# Patient Record
Sex: Male | Born: 1940
Health system: Southern US, Community
[De-identification: ages and names within clinical notes are randomized; demographics above are authoritative.]

## PROBLEM LIST (undated history)

## (undated) DIAGNOSIS — H409 Unspecified glaucoma: Secondary | ICD-10-CM

## (undated) DIAGNOSIS — K5792 Diverticulitis of intestine, part unspecified, without perforation or abscess without bleeding: Secondary | ICD-10-CM

## (undated) DIAGNOSIS — Z8669 Personal history of other diseases of the nervous system and sense organs: Secondary | ICD-10-CM

## (undated) DIAGNOSIS — I2510 Atherosclerotic heart disease of native coronary artery without angina pectoris: Secondary | ICD-10-CM

## (undated) DIAGNOSIS — G629 Polyneuropathy, unspecified: Secondary | ICD-10-CM

## (undated) DIAGNOSIS — I219 Acute myocardial infarction, unspecified: Secondary | ICD-10-CM

## (undated) DIAGNOSIS — D62 Acute posthemorrhagic anemia: Secondary | ICD-10-CM

## (undated) DIAGNOSIS — R0600 Dyspnea, unspecified: Secondary | ICD-10-CM

## (undated) DIAGNOSIS — N489 Disorder of penis, unspecified: Secondary | ICD-10-CM

## (undated) DIAGNOSIS — E119 Type 2 diabetes mellitus without complications: Secondary | ICD-10-CM

## (undated) DIAGNOSIS — G4730 Sleep apnea, unspecified: Secondary | ICD-10-CM

## (undated) DIAGNOSIS — Z0000 Encounter for general adult medical examination without abnormal findings: Secondary | ICD-10-CM

## (undated) DIAGNOSIS — Z923 Personal history of irradiation: Secondary | ICD-10-CM

## (undated) DIAGNOSIS — D649 Anemia, unspecified: Secondary | ICD-10-CM

## (undated) DIAGNOSIS — I739 Peripheral vascular disease, unspecified: Secondary | ICD-10-CM

## (undated) DIAGNOSIS — E039 Hypothyroidism, unspecified: Secondary | ICD-10-CM

## (undated) DIAGNOSIS — R351 Nocturia: Secondary | ICD-10-CM

## (undated) DIAGNOSIS — N529 Male erectile dysfunction, unspecified: Secondary | ICD-10-CM

## (undated) DIAGNOSIS — R3915 Urgency of urination: Secondary | ICD-10-CM

## (undated) DIAGNOSIS — I10 Essential (primary) hypertension: Secondary | ICD-10-CM

## (undated) DIAGNOSIS — L0390 Cellulitis, unspecified: Secondary | ICD-10-CM

## (undated) DIAGNOSIS — E785 Hyperlipidemia, unspecified: Secondary | ICD-10-CM

## (undated) HISTORY — DX: Atherosclerotic heart disease of native coronary artery without angina pectoris: I25.10

## (undated) HISTORY — DX: Acute posthemorrhagic anemia: D62

## (undated) HISTORY — DX: Disorder of penis, unspecified: N48.9

## (undated) HISTORY — DX: Polyneuropathy, unspecified: G62.9

## (undated) HISTORY — DX: Personal history of other diseases of the nervous system and sense organs: Z86.69

## (undated) HISTORY — DX: Hyperlipidemia, unspecified: E78.5

## (undated) HISTORY — DX: Sleep apnea, unspecified: G47.30

## (undated) HISTORY — DX: Male erectile dysfunction, unspecified: N52.9

## (undated) HISTORY — DX: Hypothyroidism, unspecified: E03.9

## (undated) HISTORY — DX: Nocturia: R35.1

## (undated) HISTORY — DX: Unspecified glaucoma: H40.9

## (undated) HISTORY — PX: EYE SURGERY: SHX253

## (undated) HISTORY — PX: CATARACT EXTRACTION: SUR2

## (undated) HISTORY — DX: Diverticulitis of intestine, part unspecified, without perforation or abscess without bleeding: K57.92

## (undated) HISTORY — PX: TONSILLECTOMY: SUR1361

## (undated) HISTORY — DX: Urgency of urination: R39.15

## (undated) HISTORY — DX: Encounter for general adult medical examination without abnormal findings: Z00.00

## (undated) HISTORY — PX: DENTAL SURGERY: SHX609

## (undated) HISTORY — PX: ANTERIOR CERVICAL DECOMP/DISCECTOMY FUSION: SHX1161

## (undated) HISTORY — DX: Anemia, unspecified: D64.9

---

## 1985-11-05 DIAGNOSIS — I219 Acute myocardial infarction, unspecified: Secondary | ICD-10-CM

## 1985-11-05 HISTORY — DX: Acute myocardial infarction, unspecified: I21.9

## 1989-02-05 HISTORY — PX: GUM SURGERY: SHX658

## 1999-06-08 HISTORY — PX: CORONARY ARTERY BYPASS GRAFT: SHX141

## 1999-12-17 ENCOUNTER — Encounter: Payer: Self-pay | Admitting: Cardiology

## 1999-12-17 ENCOUNTER — Ambulatory Visit (HOSPITAL_COMMUNITY): Admission: RE | Admit: 1999-12-17 | Discharge: 1999-12-18 | Payer: Self-pay | Admitting: Cardiology

## 1999-12-21 ENCOUNTER — Inpatient Hospital Stay (HOSPITAL_COMMUNITY): Admission: RE | Admit: 1999-12-21 | Discharge: 1999-12-26 | Payer: Self-pay | Admitting: Cardiothoracic Surgery

## 1999-12-21 ENCOUNTER — Encounter: Payer: Self-pay | Admitting: Cardiothoracic Surgery

## 1999-12-22 ENCOUNTER — Encounter: Payer: Self-pay | Admitting: Cardiothoracic Surgery

## 1999-12-23 ENCOUNTER — Encounter: Payer: Self-pay | Admitting: Cardiothoracic Surgery

## 1999-12-24 ENCOUNTER — Encounter: Payer: Self-pay | Admitting: Cardiothoracic Surgery

## 2000-01-19 ENCOUNTER — Encounter (HOSPITAL_COMMUNITY): Admission: RE | Admit: 2000-01-19 | Discharge: 2000-04-18 | Payer: Self-pay | Admitting: Cardiology

## 2001-06-07 HISTORY — PX: PALATE / UVULA BIOPSY / EXCISION: SUR128

## 2003-10-17 ENCOUNTER — Ambulatory Visit (HOSPITAL_COMMUNITY): Admission: RE | Admit: 2003-10-17 | Discharge: 2003-10-17 | Payer: Self-pay | Admitting: Internal Medicine

## 2011-12-16 ENCOUNTER — Ambulatory Visit (INDEPENDENT_AMBULATORY_CARE_PROVIDER_SITE_OTHER): Payer: Medicare Other | Admitting: Internal Medicine

## 2011-12-16 ENCOUNTER — Encounter: Payer: Self-pay | Admitting: Internal Medicine

## 2011-12-16 VITALS — BP 114/84 | HR 76 | Temp 98.4°F | Resp 16 | Ht 71.5 in | Wt 226.1 lb

## 2011-12-16 DIAGNOSIS — K5790 Diverticulosis of intestine, part unspecified, without perforation or abscess without bleeding: Secondary | ICD-10-CM

## 2011-12-16 DIAGNOSIS — N529 Male erectile dysfunction, unspecified: Secondary | ICD-10-CM

## 2011-12-16 DIAGNOSIS — N400 Enlarged prostate without lower urinary tract symptoms: Secondary | ICD-10-CM

## 2011-12-16 DIAGNOSIS — E119 Type 2 diabetes mellitus without complications: Secondary | ICD-10-CM

## 2011-12-16 DIAGNOSIS — I2510 Atherosclerotic heart disease of native coronary artery without angina pectoris: Secondary | ICD-10-CM

## 2011-12-16 DIAGNOSIS — E785 Hyperlipidemia, unspecified: Secondary | ICD-10-CM

## 2011-12-16 DIAGNOSIS — I700 Atherosclerosis of aorta: Secondary | ICD-10-CM | POA: Insufficient documentation

## 2011-12-16 DIAGNOSIS — K5730 Diverticulosis of large intestine without perforation or abscess without bleeding: Secondary | ICD-10-CM

## 2011-12-16 DIAGNOSIS — R21 Rash and other nonspecific skin eruption: Secondary | ICD-10-CM

## 2011-12-16 DIAGNOSIS — E1159 Type 2 diabetes mellitus with other circulatory complications: Secondary | ICD-10-CM | POA: Insufficient documentation

## 2011-12-16 DIAGNOSIS — E039 Hypothyroidism, unspecified: Secondary | ICD-10-CM

## 2011-12-16 DIAGNOSIS — E1151 Type 2 diabetes mellitus with diabetic peripheral angiopathy without gangrene: Secondary | ICD-10-CM | POA: Insufficient documentation

## 2011-12-16 LAB — CBC WITH DIFFERENTIAL/PLATELET
Basophils Absolute: 0 10*3/uL (ref 0.0–0.1)
Basophils Relative: 0 % (ref 0–1)
Eosinophils Absolute: 0.4 10*3/uL (ref 0.0–0.7)
Eosinophils Relative: 4 % (ref 0–5)
HCT: 39.7 % (ref 39.0–52.0)
Hemoglobin: 13.2 g/dL (ref 13.0–17.0)
Lymphocytes Relative: 30 % (ref 12–46)
Lymphs Abs: 3 10*3/uL (ref 0.7–4.0)
MCH: 29.7 pg (ref 26.0–34.0)
MCHC: 33.2 g/dL (ref 30.0–36.0)
MCV: 89.4 fL (ref 78.0–100.0)
Monocytes Absolute: 1 10*3/uL (ref 0.1–1.0)
Monocytes Relative: 10 % (ref 3–12)
Neutro Abs: 5.5 10*3/uL (ref 1.7–7.7)
Neutrophils Relative %: 56 % (ref 43–77)
Platelets: 257 10*3/uL (ref 150–400)
RBC: 4.44 MIL/uL (ref 4.22–5.81)
RDW: 13.4 % (ref 11.5–15.5)
WBC: 10 10*3/uL (ref 4.0–10.5)

## 2011-12-16 LAB — BASIC METABOLIC PANEL
BUN: 19 mg/dL (ref 6–23)
CO2: 27 mEq/L (ref 19–32)
Calcium: 9.1 mg/dL (ref 8.4–10.5)
Chloride: 104 mEq/L (ref 96–112)
Creat: 1.07 mg/dL (ref 0.50–1.35)
Glucose, Bld: 102 mg/dL — ABNORMAL HIGH (ref 70–99)
Potassium: 4.5 mEq/L (ref 3.5–5.3)
Sodium: 138 mEq/L (ref 135–145)

## 2011-12-16 LAB — HEPATIC FUNCTION PANEL
ALT: 22 U/L (ref 0–53)
AST: 24 U/L (ref 0–37)
Albumin: 4.4 g/dL (ref 3.5–5.2)
Alkaline Phosphatase: 51 U/L (ref 39–117)
Bilirubin, Direct: 0.1 mg/dL (ref 0.0–0.3)
Indirect Bilirubin: 0.2 mg/dL (ref 0.0–0.9)
Total Bilirubin: 0.3 mg/dL (ref 0.3–1.2)
Total Protein: 6.9 g/dL (ref 6.0–8.3)

## 2011-12-16 LAB — HEMOGLOBIN A1C
Hgb A1c MFr Bld: 6.3 % — ABNORMAL HIGH (ref <5.7)
Mean Plasma Glucose: 134 mg/dL — ABNORMAL HIGH (ref <117)

## 2011-12-16 MED ORDER — DUTASTERIDE 0.5 MG PO CAPS: 0.5000 mg | ORAL_CAPSULE | Freq: Every day | ORAL | Status: DC

## 2011-12-16 MED ORDER — VARDENAFIL HCL 20 MG PO TABS: 20.0000 mg | ORAL_TABLET | Freq: Every day | ORAL | Status: DC | PRN

## 2011-12-16 MED ORDER — METFORMIN HCL ER 500 MG PO TB24: 1000.0000 mg | ORAL_TABLET | Freq: Every day | ORAL | Status: DC

## 2011-12-16 NOTE — Patient Instructions (Signed)
Please schedule fasting labs prior to next visit Chem7, a1c-250.00 and lipid-272.4 

## 2011-12-16 NOTE — Assessment & Plan Note (Signed)
Good control based on fsbs. Obtain cbc, chem7, a1c and urine microalbumin

## 2011-12-16 NOTE — Assessment & Plan Note (Signed)
Dermatology consult

## 2011-12-16 NOTE — Assessment & Plan Note (Signed)
Asx. Schedule follow with cardiology for surveillance.

## 2011-12-16 NOTE — Progress Notes (Signed)
  Subjective:    Patient ID: Harry Brown, male    DOB: 04-23-1941, 71 y.o.   MRN: [id number]  HPI Pt presents to clinic to establish care and for follow up of multiple medical problems. Notes 2year h/o right dorsal hand rash that appears thickened/hypertrophied. S/p lidex with emolient with dose changes without improvement. Also notes 2+month h/o right thoracic back rash that itches. Lidex helps this area some. No associated pain. H/o CAD s/p CABG without recent cp or dyspnea. Last diabetic f/u several months ago. FSBS typically low 100's without hypoglycemia. UTD with diabetic eye exam. Suffers from ED and requests medication. Has attempted viagra in the past without adverse effect. Does not take NTG.   Past Medical History  Diagnosis Date  . Diabetes mellitus   . Diverticulitis   . History of sleep apnea    Past Surgical History  Procedure Date  . Coronary artery bypass graft 1998  . Tonsillectomy 2003  . Palate / uvula biopsy / excision 2003    Removed due to sleep apnea    reports that he has quit smoking. He has never used smokeless tobacco. He reports that he does not drink alcohol. His drug history not on file. family history includes Diabetes in his mother; Heart disease in his father and mother; Hyperlipidemia in his mother; and Hypertension in his mother.  There is no history of Cancer. Allergies not on file   Review of Systems  Respiratory: Negative for shortness of breath.   Cardiovascular: Negative for chest pain.  Skin: Positive for rash.  All other systems reviewed and are negative.       Objective:   Physical Exam  Physical Exam  Nursing note and vitals reviewed. Constitutional: Appears well-developed and well-nourished. No distress.  HENT:  Head: Normocephalic and atraumatic.  Right Ear: External ear normal.  Left Ear: External ear normal.  Eyes: Conjunctivae are normal. No scleral icterus.  Neck: Neck supple. Carotid bruit is not present.    Cardiovascular: Normal rate, regular rhythm and normal heart sounds.  Exam reveals no gallop and no friction rub.   No murmur heard. Pulmonary/Chest: Effort normal and breath sounds normal. No respiratory distress. He has no wheezes. no rales.  Lymphadenopathy:    He has no cervical adenopathy.  Neurological:Alert.  Skin: Skin is warm and dry. Not diaphoretic.  Psychiatric: Has a normal mood and affect.        Assessment & Plan:

## 2011-12-16 NOTE — Assessment & Plan Note (Signed)
Attempt levitra prn. Dosing instructions provided.

## 2011-12-16 NOTE — Assessment & Plan Note (Signed)
Obtain tsh/free t4 

## 2011-12-17 LAB — T4, FREE: Free T4: 1.08 ng/dL (ref 0.80–1.80)

## 2011-12-17 LAB — MICROALBUMIN / CREATININE URINE RATIO
Creatinine, Urine: 89.9 mg/dL
Microalb Creat Ratio: 8.7 mg/g (ref 0.0–30.0)
Microalb, Ur: 0.78 mg/dL (ref 0.00–1.89)

## 2011-12-17 LAB — TSH: TSH: 3.724 u[IU]/mL (ref 0.350–4.500)

## 2011-12-27 ENCOUNTER — Encounter: Payer: Self-pay | Admitting: *Deleted

## 2011-12-29 ENCOUNTER — Ambulatory Visit (INDEPENDENT_AMBULATORY_CARE_PROVIDER_SITE_OTHER): Payer: Medicare Other | Admitting: Cardiology

## 2011-12-29 ENCOUNTER — Encounter: Payer: Self-pay | Admitting: Cardiology

## 2011-12-29 VITALS — BP 124/82 | HR 66 | Ht 72.0 in | Wt 226.0 lb

## 2011-12-29 DIAGNOSIS — I2510 Atherosclerotic heart disease of native coronary artery without angina pectoris: Secondary | ICD-10-CM

## 2011-12-29 DIAGNOSIS — E785 Hyperlipidemia, unspecified: Secondary | ICD-10-CM

## 2011-12-29 DIAGNOSIS — R0989 Other specified symptoms and signs involving the circulatory and respiratory systems: Secondary | ICD-10-CM

## 2011-12-29 NOTE — Progress Notes (Signed)
`````````````````````````````````````````````````````````````````````````````````````````````````````  HPI: 71 year old male for evaluation of coronary artery disease. The patient is status post cardiac catheterization in July of 2001. His ejection fraction was 53%. The patient has three-vessel coronary disease and underwent coronary artery bypassing graft in July of 2001 with a LIMA to the LAD, RIMA to the circumflex and saphenous vein graft to the PDA. Patient has not had routine cardiology followup. Patient denies dyspnea on exertion, orthopnea, PND, pedal edema, claudication, syncope or chest pain.  Current Outpatient Prescriptions  Medication Sig Dispense Refill  . atenolol (TENORMIN) 25 MG tablet Take 1 tablet by mouth daily.      Marland Kitchen dutasteride (AVODART) 0.5 MG capsule Take 1 capsule (0.5 mg total) by mouth daily.  30 capsule  6  . FREESTYLE LITE test strip Use to test blood sugar 1 to 2 times daily      . Lancets (FREESTYLE) lancets Use to check blood sugar 1-2 times daily      . levothyroxine (SYNTHROID, LEVOTHROID) 50 MCG tablet Take 1 tablet by mouth daily.      . metFORMIN (GLUCOPHAGE-XR) 500 MG 24 hr tablet Take 2 tablets (1,000 mg total) by mouth daily with breakfast.  60 tablet  6  . nicotine polacrilex (COMMIT) 2 MG lozenge Place 2 mg inside cheek as needed.      . simvastatin (ZOCOR) 40 MG tablet Take 1 tablet by mouth At bedtime.      . triamcinolone cream (KENALOG) 0.1 % Apply 1 application topically 2 (two) times daily.      . vardenafil (LEVITRA) 20 MG tablet Take 1 tablet (20 mg total) by mouth daily as needed for erectile dysfunction.  6 tablet  3     Past Medical History  Diagnosis Date  . Diabetes mellitus   . Diverticulitis   . History of sleep apnea   . CAD (coronary artery disease)   . Hypothyroidism   . ED (erectile dysfunction)   . Other and unspecified hyperlipidemia     Past Surgical History  Procedure Date  . Coronary artery bypass graft 2001  .  Tonsillectomy 2003  . Palate / uvula biopsy / excision 2003    Removed due to sleep apnea  . Cervical disc surgery     History   Social History  . Marital Status: Married    Spouse Name: N/A    Number of Children: 8   . Years of Education: N/A   Occupational History  .     Social History Main Topics  . Smoking status: Former Games developer  . Smokeless tobacco: Never Used   Comment: Uses Comit lozenges  . Alcohol Use: No  . Drug Use: Not on file  . Sexually Active: Not on file   Other Topics Concern  . Not on file   Social History Narrative  . No narrative on file    ROS: some knee arthralgias but no fevers or chills, productive cough, hemoptysis, dysphasia, odynophagia, melena, hematochezia, dysuria, hematuria, rash, seizure activity, orthopnea, PND, pedal edema, claudication. Remaining systems are negative.  Physical Exam: Well-developed well-nourished in no acute distress.  Skin is warm and dry.  Patient does not appear to be depressed. No peripheral clubbing. Back is normal. HEENT is normal. Normal eyelids Neck is supple. Normal carotid upstroke with no bruits. No thyromegaly. Chest is clear to auscultation with normal expansion.  Cardiovascular exam is regular rate and rhythm. No murmurs rubs or gallops. Abdominal exam nontender or distended. No masses palpated. Positive bruit. No hepatosplenomegaly.  2+ femoral pulses with no bruits. Extremities show no edema. 1+ dorsalis pedis pulses bilaterally. neuro grossly intact  ECG sinus rhythm at a rate of 66. Prior inferior infarct.

## 2011-12-29 NOTE — Assessment & Plan Note (Signed)
Schedule abdominal ultrasound to exclude aneurysm. 

## 2011-12-29 NOTE — Patient Instructions (Addendum)
Your physician wants you to follow-up in: ONE YEAR WITH DR Jens Som IN [place] You will receive a reminder letter in the mail two months in advance. If you don't receive a letter, please call our office to schedule the follow-up appointment.   Your physician has requested that you have en exercise stress myoview. For further information please visit [email or web address] Please follow instruction sheet, as given.   Your physician has requested that you have an abdominal aorta duplex. During this test, an ultrasound is used to evaluate the aorta. Allow 30 minutes for this exam. Do not eat after midnight the day before and avoid carbonated beverages

## 2011-12-29 NOTE — Assessment & Plan Note (Signed)
Continue aspirin and statin. Schedule Myoview for risk stratification. 

## 2011-12-29 NOTE — Assessment & Plan Note (Signed)
Continue statin. Lipids and liver monitored by primary care. 

## 2012-01-10 ENCOUNTER — Telehealth: Payer: Self-pay | Admitting: Internal Medicine

## 2012-01-10 NOTE — Telephone Encounter (Signed)
Received medical records from [facility]. Santina Evans  P: [phone] F: [phone]

## 2012-01-11 ENCOUNTER — Ambulatory Visit (HOSPITAL_COMMUNITY): Payer: Medicare Other | Attending: Cardiology | Admitting: Radiology

## 2012-01-11 VITALS — BP 137/84 | Ht 71.5 in | Wt 224.0 lb

## 2012-01-11 DIAGNOSIS — E119 Type 2 diabetes mellitus without complications: Secondary | ICD-10-CM | POA: Insufficient documentation

## 2012-01-11 DIAGNOSIS — R002 Palpitations: Secondary | ICD-10-CM | POA: Insufficient documentation

## 2012-01-11 DIAGNOSIS — Z951 Presence of aortocoronary bypass graft: Secondary | ICD-10-CM | POA: Insufficient documentation

## 2012-01-11 DIAGNOSIS — I252 Old myocardial infarction: Secondary | ICD-10-CM | POA: Insufficient documentation

## 2012-01-11 DIAGNOSIS — Z87891 Personal history of nicotine dependence: Secondary | ICD-10-CM | POA: Insufficient documentation

## 2012-01-11 DIAGNOSIS — Z8249 Family history of ischemic heart disease and other diseases of the circulatory system: Secondary | ICD-10-CM | POA: Insufficient documentation

## 2012-01-11 DIAGNOSIS — E785 Hyperlipidemia, unspecified: Secondary | ICD-10-CM | POA: Insufficient documentation

## 2012-01-11 DIAGNOSIS — I2510 Atherosclerotic heart disease of native coronary artery without angina pectoris: Secondary | ICD-10-CM

## 2012-01-11 DIAGNOSIS — I4949 Other premature depolarization: Secondary | ICD-10-CM

## 2012-01-11 MED ORDER — REGADENOSON 0.4 MG/5ML IV SOLN
0.4000 mg | Freq: Once | INTRAVENOUS | Status: AC
  Administered 2012-01-11: 0.4 mg via INTRAVENOUS

## 2012-01-11 MED ORDER — TECHNETIUM TC 99M TETROFOSMIN IV KIT
10.0000 | PACK | Freq: Once | INTRAVENOUS | Status: AC | PRN
  Administered 2012-01-11: 10 via INTRAVENOUS

## 2012-01-11 MED ORDER — TECHNETIUM TC 99M TETROFOSMIN IV KIT
30.0000 | PACK | Freq: Once | INTRAVENOUS | Status: AC | PRN
  Administered 2012-01-11: 30 via INTRAVENOUS

## 2012-01-11 NOTE — Progress Notes (Signed)
[facility] SITE 3 NUCLEAR MED [address] [place] [address] [phone]  Cardiology Nuclear Med Study  Harry Brown is a 71 y.o. male     MRN : [id number]     DOB: 06/07/1941  Procedure Date: 01/11/2012  Nuclear Med Background Indication for Stress Test:  Evaluation for Ischemia and Graft Patency History:  1987: IWMI, 2001:Heart Cath: 3V Dz EF: 53% -Sent for CABG x3 *No Follow Up Post Bypass* Cardiac Risk Factors: Family History - CAD, History of Smoking, Lipids and NIDDM  Symptoms:  Palpitations   Nuclear Pre-Procedure Caffeine/Decaff Intake:  None > 12 hrs NPO After: 10:00pm   Lungs:  clear O2 Sat: 98% on room air. IV 0.9% NS with Angio Cath:  22g  IV Site: L Hand, tolerated well IV Started by:  Irean Hong, RN  Chest Size (in):  44 Cup Size: n/a  Height: 5' 11.5" (1.816 m)  Weight:  224 lb (101.606 kg)  BMI:  Body mass index is 30.81 kg/(m^2). Tech Comments:  Took atenolol this am per MD, Held metformin this am This patient stated on the treadmill and walked well. When she was injected part of the IV came came apart at the hub and the patient only received 1/2 the Myoview dose he was supposed to get. He was then switched to a sitting Lexiscan.    Nuclear Med Study 1 or 2 day study: 1 day  Stress Test Type:  Eugenie Birks  Reading MD: Cassell Clement, MD  Order Authorizing Provider:  Olga Millers, MD  Resting Radionuclide: Technetium 80m Tetrofosmin  Resting Radionuclide Dose: 11.0 mCi   Stress Radionuclide:  Technetium 64m Tetrofosmin  Stress Radionuclide Dose: 32.8 mCi           Stress Protocol Rest HR: 85 Stress HR: 100  Rest BP: 140/80 Stress BP: 128/75  Exercise Time (min): n/a METS: n/a   Predicted Max HR: 152 bpm % Max HR: 69.74 bpm Rate Pressure Product: 21308   Dose of Adenosine (mg):  n/a Dose of Lexiscan: 0.4 mg  Dose of Atropine (mg): n/a Dose of Dobutamine: n/a mcg/kg/min (at max HR)  Stress Test Technologist: Milana Na, EMT-P  Nuclear Technologist:  Doyne Keel, CNMT     Rest Procedure:  Myocardial perfusion imaging was performed at rest 45 minutes following the intravenous administration of Technetium 29m Tetrofosmin. Rest ECG: NSR - Normal EKG  Stress Procedure:  The patient received IV Lexiscan 0.4 mg over 15-seconds.  Technetium 85m Tetrofosmin injected at 30-seconds.  There were no significant changes and sob with Lexiscan.  Quantitative spect images were obtained after a 45 minute delay. Stress ECG: No significant change from baseline ECG  QPS Raw Data Images:  Normal; no motion artifact; normal heart/lung ratio. Stress Images:  Normal homogeneous uptake in all areas of the myocardium. Rest Images:  Normal homogeneous uptake in all areas of the myocardium. Subtraction (SDS):  No evidence of ischemia. Transient Ischemic Dilatation (Normal <1.22):  0.91 Lung/Heart Ratio (Normal <0.45):  0.35  Quantitative Gated Spect Images QGS EDV:  88 ml QGS ESV:  38 ml  Impression Exercise Capacity:  Lexiscan with no exercise. BP Response:  Normal blood pressure response. Clinical Symptoms:  No significant symptoms noted. ECG Impression:  No significant ST segment change suggestive of ischemia. Comparison with Prior Nuclear Study: No images to compare  Overall Impression:  Normal stress nuclear study.  LV Ejection Fraction: 57%.  LV Wall Motion:  NL LV Function; NL Wall  Motion  [employer]     0

## 2012-01-12 ENCOUNTER — Encounter (INDEPENDENT_AMBULATORY_CARE_PROVIDER_SITE_OTHER): Payer: Medicare Other

## 2012-01-12 DIAGNOSIS — R0989 Other specified symptoms and signs involving the circulatory and respiratory systems: Secondary | ICD-10-CM

## 2012-01-12 DIAGNOSIS — I714 Abdominal aortic aneurysm, without rupture: Secondary | ICD-10-CM

## 2012-03-21 ENCOUNTER — Ambulatory Visit: Payer: Medicare Other | Admitting: Internal Medicine

## 2012-03-24 ENCOUNTER — Telehealth: Payer: Self-pay | Admitting: *Deleted

## 2012-03-24 DIAGNOSIS — E785 Hyperlipidemia, unspecified: Secondary | ICD-10-CM

## 2012-03-24 DIAGNOSIS — E119 Type 2 diabetes mellitus without complications: Secondary | ICD-10-CM

## 2012-03-24 LAB — BASIC METABOLIC PANEL
BUN: 20 mg/dL (ref 6–23)
CO2: 28 mEq/L (ref 19–32)
Calcium: 9.2 mg/dL (ref 8.4–10.5)
Chloride: 105 mEq/L (ref 96–112)
Creat: 1 mg/dL (ref 0.50–1.35)
Glucose, Bld: 117 mg/dL — ABNORMAL HIGH (ref 70–99)
Potassium: 5 mEq/L (ref 3.5–5.3)
Sodium: 141 mEq/L (ref 135–145)

## 2012-03-24 LAB — LIPID PANEL
Cholesterol: 170 mg/dL (ref 0–200)
HDL: 44 mg/dL (ref >39)
LDL Cholesterol: 110 mg/dL — ABNORMAL HIGH (ref 0–99)
Total CHOL/HDL Ratio: 3.9 Ratio
Triglycerides: 81 mg/dL (ref <150)
VLDL: 16 mg/dL (ref 0–40)

## 2012-03-24 NOTE — Telephone Encounter (Signed)
Pt presented to the lab. Orders enter per 12/2011 office note.   Please schedule fasting labs prior to next visit  Chem7, a1c-250.00 and lipid-272.4

## 2012-03-25 LAB — HEMOGLOBIN A1C
Hgb A1c MFr Bld: 6.4 % — ABNORMAL HIGH (ref <5.7)
Mean Plasma Glucose: 137 mg/dL — ABNORMAL HIGH (ref <117)

## 2012-03-31 ENCOUNTER — Ambulatory Visit (INDEPENDENT_AMBULATORY_CARE_PROVIDER_SITE_OTHER): Payer: Medicare Other | Admitting: Internal Medicine

## 2012-03-31 ENCOUNTER — Encounter: Payer: Self-pay | Admitting: Internal Medicine

## 2012-03-31 VITALS — BP 122/78 | HR 68 | Temp 98.0°F | Resp 12 | Ht 71.5 in | Wt 230.0 lb

## 2012-03-31 DIAGNOSIS — E119 Type 2 diabetes mellitus without complications: Secondary | ICD-10-CM

## 2012-03-31 DIAGNOSIS — R209 Unspecified disturbances of skin sensation: Secondary | ICD-10-CM

## 2012-03-31 DIAGNOSIS — G629 Polyneuropathy, unspecified: Secondary | ICD-10-CM | POA: Insufficient documentation

## 2012-03-31 DIAGNOSIS — R202 Paresthesia of skin: Secondary | ICD-10-CM

## 2012-03-31 DIAGNOSIS — Z23 Encounter for immunization: Secondary | ICD-10-CM

## 2012-03-31 DIAGNOSIS — E785 Hyperlipidemia, unspecified: Secondary | ICD-10-CM

## 2012-03-31 HISTORY — DX: Polyneuropathy, unspecified: G62.9

## 2012-03-31 MED ORDER — ATORVASTATIN CALCIUM 40 MG PO TABS: 40.0000 mg | ORAL_TABLET | Freq: Every day | ORAL | Status: DC

## 2012-03-31 NOTE — Patient Instructions (Signed)
Please schedule fasting labs prior to next visit Cbc, chem7, a1c-250.00, lipid/lft-272.4, b12-paresthesia and tsh/free t4-hypothyroidism

## 2012-03-31 NOTE — Assessment & Plan Note (Signed)
LDL suboptimal. Change to Zocor to Lipitor 40 mg daily. Repeat fasting lipid profile and liver function test prior to next visit

## 2012-03-31 NOTE — Progress Notes (Signed)
  Subjective:    Patient ID: Harry Brown, male    DOB: 1940-09-04, 71 y.o.   MRN: [id number]  HPI Pt presents to clinic for followup of multiple medical problems. Notes right toes plantar aspect with odd feeling? Paresthesia. Denies back pain or radicular leg pain. Blood pressure reviewed as normotensive. Glycemic control remains good. LDL above goal taking Zocor 40 mg daily without adverse effect. Does have known history of CAD followed by cardiology. Has received influenza vaccine for the season already.  Past Medical History  Diagnosis Date  . Diabetes mellitus   . Diverticulitis   . History of sleep apnea   . CAD (coronary artery disease)   . Hypothyroidism   . ED (erectile dysfunction)   . Other and unspecified hyperlipidemia    Past Surgical History  Procedure Date  . Coronary artery bypass graft 2001  . Tonsillectomy 2003  . Palate / uvula biopsy / excision 2003    Removed due to sleep apnea  . Cervical disc surgery     reports that he has quit smoking. He has never used smokeless tobacco. He reports that he does not drink alcohol. His drug history not on file. family history includes Diabetes in his mother; Heart disease in his father and mother; Hyperlipidemia in his mother; and Hypertension in his mother.  There is no history of Cancer. No Known Allergies    Review of Systems see hpi     Objective:   Physical Exam  Physical Exam  Nursing note and vitals reviewed. Constitutional: Appears well-developed and well-nourished. No distress.  HENT:  Head: Normocephalic and atraumatic.  Right Ear: External ear normal.  Left Ear: External ear normal.  Eyes: Conjunctivae are normal. No scleral icterus.  Neck: Neck supple. Carotid bruit is not present.  Cardiovascular: Normal rate, regular rhythm and normal heart sounds.  Exam reveals no gallop and no friction rub.   No murmur heard. Pulmonary/Chest: Effort normal and breath sounds normal. No respiratory distress. He  has no wheezes. no rales.  Lymphadenopathy:    He has no cervical adenopathy.  Neurological:Alert.  Skin: Skin is warm and dry. Not diaphoretic.  Psychiatric: Has a normal mood and affect.  Diabetic foot exam-monofilament exam normal. No diabetic wounds or ulcerations noted.      Assessment & Plan:

## 2012-03-31 NOTE — Assessment & Plan Note (Signed)
Monofilament exam normal. Monitor for signs and symptoms of nerve impingement. Obtain B12 level prior to next visit

## 2012-03-31 NOTE — Assessment & Plan Note (Signed)
Good control. Continue current regimen. 

## 2012-04-05 DIAGNOSIS — Z23 Encounter for immunization: Secondary | ICD-10-CM

## 2012-06-27 ENCOUNTER — Ambulatory Visit (INDEPENDENT_AMBULATORY_CARE_PROVIDER_SITE_OTHER): Payer: Medicare Other | Admitting: Family Medicine

## 2012-06-27 ENCOUNTER — Encounter: Payer: Self-pay | Admitting: Family Medicine

## 2012-06-27 VITALS — BP 112/78 | HR 75 | Temp 98.1°F | Ht 71.5 in | Wt 230.0 lb

## 2012-06-27 DIAGNOSIS — E119 Type 2 diabetes mellitus without complications: Secondary | ICD-10-CM

## 2012-06-27 DIAGNOSIS — E785 Hyperlipidemia, unspecified: Secondary | ICD-10-CM

## 2012-06-27 DIAGNOSIS — E039 Hypothyroidism, unspecified: Secondary | ICD-10-CM

## 2012-06-27 DIAGNOSIS — I2510 Atherosclerotic heart disease of native coronary artery without angina pectoris: Secondary | ICD-10-CM

## 2012-06-27 NOTE — Patient Instructions (Addendum)
Diabetes and Exercise  Regular exercise is important and can help:   · Control blood glucose (sugar).  · Decrease blood pressure.  ·   · Control blood lipids (cholesterol, triglycerides).  · Improve overall health.  BENEFITS FROM EXERCISE  · Improved fitness.  · Improved flexibility.  · Improved endurance.  · Increased bone density.  · Weight control.  · Increased muscle strength.  · Decreased body fat.  · Improvement of the body's use of insulin, a hormone.  · Increased insulin sensitivity.  · Reduction of insulin needs.  · Reduced stress and tension.  · Helps you feel better.  People with diabetes who add exercise to their lifestyle gain additional benefits, including:  · Weight loss.  · Reduced appetite.  · Improvement of the body's use of blood glucose.  · Decreased risk factors for heart disease:  · Lowering of cholesterol and triglycerides.  · Raising the level of good cholesterol (high-density lipoproteins, HDL).  · Lowering blood sugar.  · Decreased blood pressure.  TYPE 1 DIABETES AND EXERCISE  · Exercise will usually lower your blood glucose.  · If blood glucose is greater than 240 mg/dl, check urine ketones. If ketones are present, do not exercise.  · Location of the insulin injection sites may need to be adjusted with exercise. Avoid injecting insulin into areas of the body that will be exercised. For example, avoid injecting insulin into:  · The arms when playing tennis.  · The legs when jogging. For more information, discuss this with your caregiver.  · Keep a record of:  · Food intake.  · Type and amount of exercise.  · Expected peak times of insulin action.  · Blood glucose levels.  Do this before, during, and after exercise. Review your records with your caregiver. This will help you to develop guidelines for adjusting food intake and insulin amounts.   TYPE 2 DIABETES AND EXERCISE  · Regular physical activity can help control blood glucose.  · Exercise is important because it may:  · Increase the  body's sensitivity to insulin.  · Improve blood glucose control.  · Exercise reduces the risk of heart disease. It decreases serum cholesterol and triglycerides. It also lowers blood pressure.  · Those who take insulin or oral hypoglycemic agents should watch for signs of hypoglycemia. These signs include dizziness, shaking, sweating, chills, and confusion.  · Body water is lost during exercise. It must be replaced. This will help to avoid loss of body fluids (dehydration) or heat stroke.  Be sure to talk to your caregiver before starting an exercise program to make sure it is safe for you. Remember, any activity is better than none.   Document Released: 08/14/2003 Document Revised: 08/16/2011 Document Reviewed: 11/28/2008  Exit[employer]® Patient Information ©2013 [employer].

## 2012-06-28 ENCOUNTER — Encounter: Payer: Self-pay | Admitting: Family Medicine

## 2012-07-02 NOTE — Progress Notes (Signed)
Patient ID: Harry Brown, male   DOB: June 24, 1940, 72 y.o.   MRN: [id number] Harry Brown [id number] 1940/08/14 07/02/2012      Progress Note-Follow Up  Subjective  Chief Complaint  Chief Complaint  Patient presents with  . Follow-up    3 month    HPI  Patient is a 72 year old Caucasian male who is in today for routine followup. He reports generally being in good health although he did have a headache last night which is unusual for him. He says he woke up with a headache over his for it he took some Advil it has resolved and he feels well today. No neurologic complaints are noted. He notes his blood sugars are well controlled. He reports his blood sugars morning was 119. Anion numbers range between 80 and 120. He notes yesterday he had a long drive to [place] to help his wife do some shopping and things that may have contributed to his headache. No illness, congestion, fevers, chest pain, palpitations, shortness of breath, GI or GU complaints noted.  Past Medical History  Diagnosis Date  . Diabetes mellitus   . Diverticulitis   . History of sleep apnea   . CAD (coronary artery disease)   . Hypothyroidism   . ED (erectile dysfunction)   . Other and unspecified hyperlipidemia     Past Surgical History  Procedure Date  . Coronary artery bypass graft 2001  . Tonsillectomy 2003  . Palate / uvula biopsy / excision 2003    Removed due to sleep apnea  . Cervical disc surgery     Family History  Problem Relation Age of Onset  . Hyperlipidemia Mother   . Heart disease Mother   . Diabetes Mother   . Hypertension Mother   . Heart disease Father   . Cancer Neg Hx     History   Social History  . Marital Status: Married    Spouse Name: N/A    Number of Children: 8   . Years of Education: N/A   Occupational History  .     Social History Main Topics  . Smoking status: Former Games developer  . Smokeless tobacco: Never Used     Comment: Uses Comit lozenges  . Alcohol Use:  No  . Drug Use: Not on file  . Sexually Active: Not on file   Other Topics Concern  . Not on file   Social History Narrative  . No narrative on file    Current Outpatient Prescriptions on File Prior to Visit  Medication Sig Dispense Refill  . atenolol (TENORMIN) 25 MG tablet Take 1 tablet by mouth daily.      Marland Kitchen atorvastatin (LIPITOR) 40 MG tablet Take 1 tablet (40 mg total) by mouth daily.  30 tablet  6  . dutasteride (AVODART) 0.5 MG capsule Take 1 capsule (0.5 mg total) by mouth daily.  30 capsule  6  . FREESTYLE LITE test strip Use to test blood sugar 1 to 2 times daily      . Lancets (FREESTYLE) lancets Use to check blood sugar 1-2 times daily      . levothyroxine (SYNTHROID, LEVOTHROID) 50 MCG tablet Take 1 tablet by mouth daily.      . metFORMIN (GLUCOPHAGE-XR) 500 MG 24 hr tablet Take 2 tablets (1,000 mg total) by mouth daily with breakfast.  60 tablet  6  . nicotine polacrilex (COMMIT) 2 MG lozenge Place 2 mg inside cheek as needed.      . triamcinolone  cream (KENALOG) 0.1 % Apply 1 application topically 2 (two) times daily.      . vardenafil (LEVITRA) 20 MG tablet Take 1 tablet (20 mg total) by mouth daily as needed for erectile dysfunction.  6 tablet  3    No Known Allergies  Review of Systems  Review of Systems  Constitutional: Negative for fever and malaise/fatigue.  HENT: Negative for congestion.   Eyes: Negative for discharge.  Respiratory: Negative for shortness of breath.   Cardiovascular: Negative for chest pain, palpitations and leg swelling.  Gastrointestinal: Negative for nausea, abdominal pain and diarrhea.  Genitourinary: Negative for dysuria.  Musculoskeletal: Negative for falls.  Skin: Negative for rash.  Neurological: Negative for loss of consciousness and headaches.  Endo/Heme/Allergies: Negative for polydipsia.  Psychiatric/Behavioral: Negative for depression and suicidal ideas. The patient is not nervous/anxious and does not have insomnia.      Objective  BP 112/78  Pulse 75  Temp 98.1 F (36.7 C) (Oral)  Ht 5' 11.5" (1.816 m)  Wt 230 lb 0.6 oz (104.345 kg)  BMI 31.64 kg/m2  SpO2 98%  Physical Exam  Physical Exam  Constitutional: He is oriented to person, place, and time and well-developed, well-nourished, and in no distress. No distress.  HENT:  Head: Normocephalic and atraumatic.  Eyes: Conjunctivae normal are normal.  Neck: Neck supple. No thyromegaly present.  Cardiovascular: Normal rate, regular rhythm and normal heart sounds.   No murmur heard. Pulmonary/Chest: Effort normal and breath sounds normal. No respiratory distress.  Abdominal: He exhibits no distension and no mass. There is no tenderness.  Musculoskeletal: He exhibits no edema.  Neurological: He is alert and oriented to person, place, and time.  Skin: Skin is warm.  Psychiatric: Memory, affect and judgment normal.    Lab Results  Component Value Date   TSH 3.724 12/16/2011   Lab Results  Component Value Date   WBC 10.0 12/16/2011   HGB 13.2 12/16/2011   HCT 39.7 12/16/2011   MCV 89.4 12/16/2011   PLT 257 12/16/2011   Lab Results  Component Value Date   CREATININE 1.00 03/24/2012   BUN 20 03/24/2012   NA 141 03/24/2012   K 5.0 03/24/2012   CL 105 03/24/2012   CO2 28 03/24/2012   Lab Results  Component Value Date   ALT 22 12/16/2011   AST 24 12/16/2011   ALKPHOS 51 12/16/2011   BILITOT 0.3 12/16/2011   Lab Results  Component Value Date   CHOL 170 03/24/2012   Lab Results  Component Value Date   HDL 44 03/24/2012   Lab Results  Component Value Date   LDLCALC 110* 03/24/2012   Lab Results  Component Value Date   TRIG 81 03/24/2012   Lab Results  Component Value Date   CHOLHDL 3.9 03/24/2012     Assessment & Plan  DM (diabetes mellitus) Well controlled, am numbers between 80 to 120. Continue current meds and avoid simple carbs.   Hypothyroidism Stable on current dose of Levothyroxine no changes.  Other and  unspecified hyperlipidemia Avoid trans fats, continue Atorvastatin  CAD (coronary artery disease) Asymptomatic, avoid trans fats, continue current meds.

## 2012-07-02 NOTE — Assessment & Plan Note (Signed)
Avoid trans fats, continue Atorvastatin

## 2012-07-02 NOTE — Assessment & Plan Note (Signed)
Stable on current dose of Levothyroxine no changes.

## 2012-07-02 NOTE — Assessment & Plan Note (Signed)
Well controlled, am numbers between 80 to 120. Continue current meds and avoid simple carbs.

## 2012-07-02 NOTE — Assessment & Plan Note (Signed)
Asymptomatic, avoid trans fats, continue current meds.

## 2012-07-13 ENCOUNTER — Telehealth: Payer: Self-pay | Admitting: Internal Medicine

## 2012-07-13 MED ORDER — ATENOLOL 25 MG PO TABS: 25.0000 mg | ORAL_TABLET | Freq: Every day | ORAL | Status: DC

## 2012-07-13 NOTE — Telephone Encounter (Signed)
Refill- atenolol 25mg  tab. Take one tablet by mouth every day(needs ov and labs for further refills). Qty 30 last fill 1.3.14

## 2012-08-07 ENCOUNTER — Telehealth: Payer: Self-pay | Admitting: Internal Medicine

## 2012-08-07 MED ORDER — DUTASTERIDE 0.5 MG PO CAPS: 0.5000 mg | ORAL_CAPSULE | Freq: Every day | ORAL | Status: DC

## 2012-08-07 MED ORDER — METFORMIN HCL ER 500 MG PO TB24: 1000.0000 mg | ORAL_TABLET | Freq: Every day | ORAL | Status: DC

## 2012-08-07 NOTE — Telephone Encounter (Signed)
Refill- avodart 0.5mg  cap. Take one capsule by mouth every day. Qty 30 last fill 2.3.14  Refill- metformin er 500mg  tab. Take two tablets by mouth every day with breakfast. Qty 60 last fill 1.29.14

## 2012-10-26 ENCOUNTER — Telehealth: Payer: Self-pay | Admitting: *Deleted

## 2012-10-26 MED ORDER — LEVOTHYROXINE SODIUM 50 MCG PO TABS: 50.0000 ug | ORAL_TABLET | Freq: Every day | ORAL | Status: DC

## 2012-10-26 NOTE — Telephone Encounter (Signed)
Patient's wife left message for refill on levothyroxine 50 mcg. Send to [facility], let pt know when rx is sent. Rx sent in to pharmacy. Patient notified.

## 2012-11-06 ENCOUNTER — Other Ambulatory Visit: Payer: Self-pay | Admitting: Family Medicine

## 2012-11-12 ENCOUNTER — Other Ambulatory Visit: Payer: Self-pay | Admitting: Internal Medicine

## 2012-11-15 NOTE — Telephone Encounter (Signed)
Rx transmission failed and was called to pharmacy voicemail as below.  atorvastatin (LIPITOR) 40 MG tablet 30 tablet  No refills.  11/12/2012 Sig - Route: Take 1 tablet (40 mg total) by mouth daily. - Oral E-Prescribing Status: Transmission to pharmacy failed (11/13/2012 11:11 AM EDT)

## 2012-11-20 ENCOUNTER — Encounter: Payer: Self-pay | Admitting: Family Medicine

## 2012-11-20 ENCOUNTER — Ambulatory Visit (INDEPENDENT_AMBULATORY_CARE_PROVIDER_SITE_OTHER): Payer: Medicare Other | Admitting: Family Medicine

## 2012-11-20 VITALS — BP 110/70 | HR 71 | Temp 98.2°F | Ht 71.5 in | Wt 228.1 lb

## 2012-11-20 DIAGNOSIS — R350 Frequency of micturition: Secondary | ICD-10-CM

## 2012-11-20 DIAGNOSIS — B351 Tinea unguium: Secondary | ICD-10-CM

## 2012-11-20 DIAGNOSIS — M542 Cervicalgia: Secondary | ICD-10-CM

## 2012-11-20 DIAGNOSIS — E119 Type 2 diabetes mellitus without complications: Secondary | ICD-10-CM

## 2012-11-20 DIAGNOSIS — R3915 Urgency of urination: Secondary | ICD-10-CM

## 2012-11-20 DIAGNOSIS — R351 Nocturia: Secondary | ICD-10-CM

## 2012-11-20 DIAGNOSIS — L309 Dermatitis, unspecified: Secondary | ICD-10-CM

## 2012-11-20 DIAGNOSIS — E039 Hypothyroidism, unspecified: Secondary | ICD-10-CM

## 2012-11-20 DIAGNOSIS — I2510 Atherosclerotic heart disease of native coronary artery without angina pectoris: Secondary | ICD-10-CM

## 2012-11-20 DIAGNOSIS — L259 Unspecified contact dermatitis, unspecified cause: Secondary | ICD-10-CM

## 2012-11-20 DIAGNOSIS — E785 Hyperlipidemia, unspecified: Secondary | ICD-10-CM

## 2012-11-20 LAB — RENAL FUNCTION PANEL
Albumin: 4.2 g/dL (ref 3.5–5.2)
BUN: 20 mg/dL (ref 6–23)
CO2: 27 mEq/L (ref 19–32)
Calcium: 9.3 mg/dL (ref 8.4–10.5)
Chloride: 105 mEq/L (ref 96–112)
Creat: 1.13 mg/dL (ref 0.50–1.35)
Glucose, Bld: 96 mg/dL (ref 70–99)
Phosphorus: 3.5 mg/dL (ref 2.3–4.6)
Potassium: 4.7 mEq/L (ref 3.5–5.3)
Sodium: 140 mEq/L (ref 135–145)

## 2012-11-20 LAB — HEPATIC FUNCTION PANEL
ALT: 26 U/L (ref 0–53)
AST: 23 U/L (ref 0–37)
Albumin: 4.2 g/dL (ref 3.5–5.2)
Alkaline Phosphatase: 50 U/L (ref 39–117)
Bilirubin, Direct: 0.1 mg/dL (ref 0.0–0.3)
Indirect Bilirubin: 0.3 mg/dL (ref 0.0–0.9)
Total Bilirubin: 0.4 mg/dL (ref 0.3–1.2)
Total Protein: 6.7 g/dL (ref 6.0–8.3)

## 2012-11-20 LAB — HEMOGLOBIN A1C
Hgb A1c MFr Bld: 6.7 % — ABNORMAL HIGH (ref <5.7)
Mean Plasma Glucose: 146 mg/dL — ABNORMAL HIGH (ref <117)

## 2012-11-20 LAB — LIPID PANEL
Cholesterol: 137 mg/dL (ref 0–200)
HDL: 43 mg/dL (ref >39)
LDL Cholesterol: 66 mg/dL (ref 0–99)
Total CHOL/HDL Ratio: 3.2 Ratio
Triglycerides: 138 mg/dL (ref <150)
VLDL: 28 mg/dL (ref 0–40)

## 2012-11-20 LAB — CBC
HCT: 38 % — ABNORMAL LOW (ref 39.0–52.0)
Hemoglobin: 12.7 g/dL — ABNORMAL LOW (ref 13.0–17.0)
MCH: 29.3 pg (ref 26.0–34.0)
MCHC: 33.4 g/dL (ref 30.0–36.0)
MCV: 87.8 fL (ref 78.0–100.0)
Platelets: 261 10*3/uL (ref 150–400)
RBC: 4.33 MIL/uL (ref 4.22–5.81)
RDW: 13.7 % (ref 11.5–15.5)
WBC: 9.8 10*3/uL (ref 4.0–10.5)

## 2012-11-20 MED ORDER — CLOBETASOL PROPIONATE 0.05 % EX CREA: TOPICAL_CREAM | Freq: Two times a day (BID) | CUTANEOUS | Status: DC

## 2012-11-20 NOTE — Patient Instructions (Addendum)
Can consider soaking feet in 1/2 warm water and 1/2 distilled white vinegar each evening and they cover toenails with Vick's vapor rub    Ringworm, Nail A fungal infection of the nail (tinea unguium/onychomycosis) is common. It is common as the visible part of the nail is composed of dead cells which have no blood supply to help prevent infection. It occurs because fungi are everywhere and will pick any opportunity to grow on any dead material. Because nails are very slow growing they require up to 2 years of treatment with anti-fungal medications. The entire nail back to the base is infected. This includes approximately  of the nail which you cannot see. If your caregiver has prescribed a medication by mouth, take it every day and as directed. No progress will be seen for at least 6 to 9 months. Do not be disappointed! Because fungi live on dead cells with little or no exposure to blood supply, medication delivery to the infection is slow; thus the cure is slow. It is also why you can observe no progress in the first 6 months. The nail becoming cured is the base of the nail, as it has the blood supply. Topical medication such as creams and ointments are usually not effective. Important in successful treatment of nail fungus is closely following the medication regimen that your doctor prescribes. Sometimes you and your caregiver may elect to speed up this process by surgical removal of all the nails. Even this may still require 6 to 9 months of additional oral medications. See your caregiver as directed. Remember there will be no visible improvement for at least 6 months. See your caregiver sooner if other signs of infection (redness and swelling) develop. Document Released: 05/21/2000 Document Revised: 08/16/2011 Document Reviewed: 07/30/2008 [facility] Patient Information 2014 [place], [place].

## 2012-11-21 LAB — URINALYSIS
Bilirubin Urine: NEGATIVE
Glucose, UA: NEGATIVE mg/dL
Hgb urine dipstick: NEGATIVE
Ketones, ur: NEGATIVE mg/dL
Leukocytes, UA: NEGATIVE
Nitrite: NEGATIVE
Protein, ur: NEGATIVE mg/dL
Specific Gravity, Urine: 1.021 (ref 1.005–1.030)
Urobilinogen, UA: 1 mg/dL (ref 0.0–1.0)
pH: 7 (ref 5.0–8.0)

## 2012-11-21 LAB — PSA: PSA: 0.58 ng/mL (ref <=4.00)

## 2012-11-22 ENCOUNTER — Encounter: Payer: Self-pay | Admitting: Family Medicine

## 2012-11-22 DIAGNOSIS — M542 Cervicalgia: Secondary | ICD-10-CM | POA: Insufficient documentation

## 2012-11-22 DIAGNOSIS — R3915 Urgency of urination: Secondary | ICD-10-CM

## 2012-11-22 HISTORY — DX: Urgency of urination: R39.15

## 2012-11-22 LAB — URINE CULTURE
Colony Count: NO GROWTH
Organism ID, Bacteria: NO GROWTH

## 2012-11-22 NOTE — Progress Notes (Signed)
Quick Note:  Patient Informed and voiced understanding ______ 

## 2012-11-22 NOTE — Progress Notes (Signed)
Patient ID: ISHMAIL MCMANAMON, male   DOB: 08/04/1940, 72 y.o.   MRN: [id number] JUSTIN MEISENHEIMER [id number] [birth year] 11/22/2012      Progress Note-Follow Up  Subjective  Chief Complaint  Chief Complaint  Patient presents with  . Follow-up    6 month    HPI  Patient is a 72 year old male who is in today in followup. He is complaining of some mild low back pain more notable on the left. Also complaining of some dry mouth with urinary frequency and urgency. No hesitancy or abdominal pain. No fevers or chills. Is having some left-sided neck pain at times. Also notes some scar tissue on his hands and now and headaches GI or GU noted today.  Past Medical History  Diagnosis Date  . Diabetes mellitus   . Diverticulitis   . History of sleep apnea   . CAD (coronary artery disease)   . Hypothyroidism   . ED (erectile dysfunction)   . Other and unspecified hyperlipidemia   . Neck pain on left side 11/22/2012  . Urinary urgency 11/22/2012    Past Surgical History  Procedure Laterality Date  . Coronary artery bypass graft  2001  . Tonsillectomy  2003  . Palate / uvula biopsy / excision  2003    Removed due to sleep apnea  . Cervical disc surgery      Family History  Problem Relation Age of Onset  . Hyperlipidemia Mother   . Heart disease Mother   . Diabetes Mother   . Hypertension Mother   . Heart disease Father   . Cancer Neg Hx     History   Social History  . Marital Status: Married    Spouse Name: N/A    Number of Children: 8   . Years of Education: N/A   Occupational History  .     Social History Main Topics  . Smoking status: Former Games developer  . Smokeless tobacco: Never Used     Comment: Uses Comit lozenges  . Alcohol Use: No  . Drug Use: Not on file  . Sexually Active: Not on file   Other Topics Concern  . Not on file   Social History Narrative  . No narrative on file    Current Outpatient Prescriptions on File Prior to Visit  Medication Sig Dispense  Refill  . atenolol (TENORMIN) 25 MG tablet Take 1 tablet (25 mg total) by mouth daily.  30 tablet  5  . atorvastatin (LIPITOR) 40 MG tablet Take 1 tablet (40 mg total) by mouth daily.  30 tablet  0  . dutasteride (AVODART) 0.5 MG capsule Take 1 capsule (0.5 mg total) by mouth daily.  30 capsule  6  . FREESTYLE LITE test strip Use to test blood sugar 1 to 2 times daily      . Lancets (FREESTYLE) lancets Use to check blood sugar 1-2 times daily      . levothyroxine (SYNTHROID, LEVOTHROID) 50 MCG tablet TAKE ONE TABLET BY MOUTH ONCE DAILY. DUE FOR LABS  30 tablet  0  . metFORMIN (GLUCOPHAGE-XR) 500 MG 24 hr tablet Take 2 tablets (1,000 mg total) by mouth daily with breakfast.  60 tablet  6  . nicotine polacrilex (COMMIT) 2 MG lozenge Place 2 mg inside cheek as needed.      . triamcinolone cream (KENALOG) 0.1 % Apply 1 application topically 2 (two) times daily.      . vardenafil (LEVITRA) 20 MG tablet Take 1 tablet (20 mg total)  by mouth daily as needed for erectile dysfunction.  6 tablet  3   No current facility-administered medications on file prior to visit.    No Known Allergies  Review of Systems  Review of Systems  Constitutional: Negative for fever, chills and malaise/fatigue.  HENT: Negative for hearing loss, nosebleeds and congestion.   Eyes: Negative for pain and discharge.  Respiratory: Negative for cough, sputum production, shortness of breath and wheezing.   Cardiovascular: Negative for chest pain, palpitations and leg swelling.  Gastrointestinal: Negative for heartburn, nausea, vomiting, abdominal pain, diarrhea, constipation and blood in stool.  Genitourinary: Positive for urgency. Negative for dysuria, frequency and hematuria.  Musculoskeletal: Positive for joint pain. Negative for myalgias, back pain and falls.  Skin: Negative for rash.  Neurological: Positive for sensory change. Negative for dizziness, tremors, focal weakness, loss of consciousness, weakness and headaches.   Endo/Heme/Allergies: Negative for polydipsia. Does not bruise/bleed easily.  Psychiatric/Behavioral: Negative for depression and suicidal ideas. The patient is not nervous/anxious and does not have insomnia.     Objective  BP 110/70  Pulse 71  Temp(Src) 98.2 F (36.8 C) (Oral)  Ht 5' 11.5" (1.816 m)  Wt 228 lb 1.9 oz (103.475 kg)  BMI 31.38 kg/m2  SpO2 96%  Physical Exam  Physical Exam  Constitutional: He is oriented to person, place, and time and well-developed, well-nourished, and in no distress. No distress.  HENT:  Head: Normocephalic and atraumatic.  Eyes: Conjunctivae are normal.  Neck: Neck supple. No thyromegaly present.  Cardiovascular: Normal rate, regular rhythm and normal heart sounds.   No murmur heard. Pulmonary/Chest: Effort normal and breath sounds normal. No respiratory distress.  Abdominal: He exhibits no distension and no mass. There is no tenderness.  Musculoskeletal: He exhibits no edema.  Neurological: He is alert and oriented to person, place, and time.  Skin: Skin is warm.  Psychiatric: Memory, affect and judgment normal.    Lab Results  Component Value Date   TSH 3.724 12/16/2011   Lab Results  Component Value Date   WBC 9.8 11/20/2012   HGB 12.7* 11/20/2012   HCT 38.0* 11/20/2012   MCV 87.8 11/20/2012   PLT 261 11/20/2012   Lab Results  Component Value Date   CREATININE 1.13 11/20/2012   BUN 20 11/20/2012   NA 140 11/20/2012   K 4.7 11/20/2012   CL 105 11/20/2012   CO2 27 11/20/2012   Lab Results  Component Value Date   ALT 26 11/20/2012   AST 23 11/20/2012   ALKPHOS 50 11/20/2012   BILITOT 0.4 11/20/2012   Lab Results  Component Value Date   CHOL 137 11/20/2012   Lab Results  Component Value Date   HDL 43 11/20/2012   Lab Results  Component Value Date   LDLCALC 66 11/20/2012   Lab Results  Component Value Date   TRIG 138 11/20/2012   Lab Results  Component Value Date   CHOLHDL 3.2 11/20/2012     Assessment & Plan DM (diabetes  mellitus) Well controlled, minimize simple carbs, continue current meds  Hypothyroidism Thyroid studies normal on low dose levothyroxine  Neck pain on left side Moist heat and gentle stretching, salon pas patches  CAD (coronary artery disease) Asymptomatic, tolerating meds.  Other and unspecified hyperlipidemia Avoid trans fats, continue current meds, start krill oil cap

## 2012-11-23 NOTE — Assessment & Plan Note (Signed)
Asymptomatic, tolerating meds. 

## 2012-11-23 NOTE — Assessment & Plan Note (Signed)
Avoid trans fats, continue current meds, start krill oil cap

## 2012-11-23 NOTE — Assessment & Plan Note (Signed)
Moist heat and gentle stretching, salon pas patches

## 2012-11-23 NOTE — Assessment & Plan Note (Signed)
Well controlled, minimize simple carbs, continue current meds

## 2012-11-23 NOTE — Assessment & Plan Note (Signed)
Thyroid studies normal on low dose levothyroxine

## 2012-11-27 ENCOUNTER — Ambulatory Visit (INDEPENDENT_AMBULATORY_CARE_PROVIDER_SITE_OTHER): Payer: Medicare Other | Admitting: Podiatry

## 2012-11-27 VITALS — BP 140/69 | HR 77

## 2012-11-27 DIAGNOSIS — B351 Tinea unguium: Secondary | ICD-10-CM

## 2012-11-27 DIAGNOSIS — R202 Paresthesia of skin: Secondary | ICD-10-CM

## 2012-11-27 DIAGNOSIS — R209 Unspecified disturbances of skin sensation: Secondary | ICD-10-CM

## 2012-11-27 DIAGNOSIS — I739 Peripheral vascular disease, unspecified: Secondary | ICD-10-CM

## 2012-11-27 NOTE — Progress Notes (Signed)
SUBJECTIVE: 72 y.o. year old male presents for diabetic foot care. Patient is ambulatory without assistance. Patient is referred by Dr. Abner Greenspan. 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 They hurt after been on them long hours. He soaks both feet in Vinegar and water, and uses Vick's Vapor Rub on toe nails. Patient is type II NIDDM for duration of about 5 years. Stated that blood sugar level is under uncontrol (runs between 105 and 125).    REVIEW OF SYSTEMS: Constitutional: negative for anorexia, chills, fatigue, fevers, night sweats and weight loss Eyes: negative, Wears corrective lenses. Due for an annual eye check up. Ears, nose, mouth, throat, and face: negative Respiratory: negative Cardiovascular: By pass surgery 15 years ago and doing well since then. Gastrointestinal: negative Musculoskeletal:negative Neurological: negative  OBJECTIVE: DERMATOLOGIC EXAMINATION: Nails are soft and thick, discolored with fungal debris on both great toe nails. Left great toe lateral border is mildly inflamed with ingrown nail. Skin Integrity: Normal findings. Calluses: None noted.  VASCULAR EXAMINATION OF LOWER LIMBS: Pedal pulses are not palpable, both Dorsalis pedis and posterior tibal on both feet.  Capillary Filling times within 3 seconds in all digits.  No signs of trophic changes seen. Edema: Absent. Ischemic changes not seen.  Temperature gradient from tibial crest to dorsum of foot is within normal bilateral.  NEUROLOGIC EXAMINATION OF THE LOWER LIMBS: Achilles DTR is present and within normal. Monofilament (Semmes-Weinstein 10-gm) sensory testing positive 4 out of 6, bilateral. Vibratory sensations(128Hz  turning fork) intact at medial and lateral forefoot bilateral.  Sharp and Dull discriminatory sensations at the plantar ball of hallux is intact bilateral.   MUSCULOSKELETAL EXAMINATION: Positive for Hyperextended right hallux.  No other growth deformities  noted.  ASSESSMENT: 1. Onychomycosis 1-5 bilateral. 2. Ingrown nail left hallux lateral border without active infection. 3. Peripheral Vascular Insufficiency both feet.  4. NIDDM controlled.   PLAN: 1. Reviewed findings including none palpable pedal pulses. 2. Debrided all nails. 3. Continue with Vinegar and water soaking, and use Vicks Vapor Rub. 4. May benefit from Vascular consultation for none palpable pedal pulses on lower limbs.  Return in 3 months or as needed.

## 2012-11-29 ENCOUNTER — Other Ambulatory Visit: Payer: Self-pay | Admitting: Family Medicine

## 2012-11-29 DIAGNOSIS — R0989 Other specified symptoms and signs involving the circulatory and respiratory systems: Secondary | ICD-10-CM

## 2012-12-26 ENCOUNTER — Other Ambulatory Visit: Payer: Self-pay | Admitting: *Deleted

## 2012-12-26 MED ORDER — ATORVASTATIN CALCIUM 40 MG PO TABS: 40.0000 mg | ORAL_TABLET | Freq: Every day | ORAL | Status: DC

## 2012-12-26 NOTE — Telephone Encounter (Signed)
Rx request to pharmacy/SLS  

## 2012-12-28 ENCOUNTER — Other Ambulatory Visit: Payer: Self-pay | Admitting: *Deleted

## 2012-12-28 DIAGNOSIS — R0989 Other specified symptoms and signs involving the circulatory and respiratory systems: Secondary | ICD-10-CM

## 2012-12-28 MED ORDER — LEVOTHYROXINE SODIUM 50 MCG PO TABS: ORAL_TABLET | ORAL | Status: DC

## 2012-12-28 NOTE — Telephone Encounter (Signed)
Rx request to pharmacy/SLS  

## 2013-01-02 ENCOUNTER — Other Ambulatory Visit: Payer: Self-pay | Admitting: *Deleted

## 2013-01-02 MED ORDER — ATENOLOL 25 MG PO TABS: 25.0000 mg | ORAL_TABLET | Freq: Every day | ORAL | Status: DC

## 2013-01-02 NOTE — Telephone Encounter (Signed)
Rx request to pharmacy/SLS  

## 2013-01-11 ENCOUNTER — Encounter (INDEPENDENT_AMBULATORY_CARE_PROVIDER_SITE_OTHER): Payer: Medicare Other

## 2013-01-11 DIAGNOSIS — I714 Abdominal aortic aneurysm, without rupture: Secondary | ICD-10-CM

## 2013-01-22 ENCOUNTER — Encounter: Payer: Medicare Other | Admitting: Surgery

## 2013-02-06 ENCOUNTER — Encounter: Payer: Medicare Other | Admitting: Vascular Surgery

## 2013-02-10 LAB — HM DIABETES EYE EXAM

## 2013-02-20 ENCOUNTER — Encounter: Payer: Self-pay | Admitting: Family Medicine

## 2013-02-22 ENCOUNTER — Encounter: Payer: Self-pay | Admitting: Vascular Surgery

## 2013-02-23 ENCOUNTER — Encounter (INDEPENDENT_AMBULATORY_CARE_PROVIDER_SITE_OTHER): Payer: Medicare Other | Admitting: *Deleted

## 2013-02-23 ENCOUNTER — Ambulatory Visit (INDEPENDENT_AMBULATORY_CARE_PROVIDER_SITE_OTHER): Payer: Medicare Other | Admitting: Vascular Surgery

## 2013-02-23 ENCOUNTER — Encounter: Payer: Self-pay | Admitting: Vascular Surgery

## 2013-02-23 ENCOUNTER — Other Ambulatory Visit: Payer: Self-pay | Admitting: *Deleted

## 2013-02-23 VITALS — BP 138/84 | HR 66 | Ht 71.5 in | Wt 225.0 lb

## 2013-02-23 DIAGNOSIS — I70219 Atherosclerosis of native arteries of extremities with intermittent claudication, unspecified extremity: Secondary | ICD-10-CM

## 2013-02-23 DIAGNOSIS — R0989 Other specified symptoms and signs involving the circulatory and respiratory systems: Secondary | ICD-10-CM

## 2013-02-23 DIAGNOSIS — R9430 Abnormal result of cardiovascular function study, unspecified: Secondary | ICD-10-CM

## 2013-02-23 DIAGNOSIS — I739 Peripheral vascular disease, unspecified: Secondary | ICD-10-CM

## 2013-02-23 NOTE — Progress Notes (Signed)
[facility] [place]  Referred by:  Bradd Canary, MD [address] [place], [place] [address]  Reason for referral: R>L leg pain  History of Present Illness  Harry Brown is a 72 y.o. ([birth year]) male who presents with chief complaint: R>L leg pain.  Onset of symptom occurred years ago of intermittent claudication.  Pain is described as R>L foot aching, mostly with activity and walking but rarely now at rest also in R foot, severity 3-6/10.  Patient has attempted to treat this pain with rest.  The patient has some rest pain symptoms also and no leg wounds/ulcers.   Pt notes some intermittent numbness in left leg.  By report his DM is under good controlled without neuropathy.  Atherosclerotic risk factors include: DM,. Former smoking, hyperlipidemia.  Past Medical History  Diagnosis Date  . Diabetes mellitus   . Diverticulitis   . History of sleep apnea   . CAD (coronary artery disease)   . Hypothyroidism   . ED (erectile dysfunction)   . Other and unspecified hyperlipidemia   . Neck pain on left side 11/22/2012  . Urinary urgency 11/22/2012    Past Surgical History  Procedure Laterality Date  . Coronary artery bypass graft  2001  . Tonsillectomy  2003  . Palate / uvula biopsy / excision  2003    Removed due to sleep apnea  . Cervical disc surgery      History   Social History  . Marital Status: Married    Spouse Name: N/A    Number of Children: 8   . Years of Education: N/A   Occupational History  .     Social History Main Topics  . Smoking status: Former Games developer  . Smokeless tobacco: Never Used     Comment: Uses Comit lozenges  . Alcohol Use: No  . Drug Use: Not on file  . Sexual Activity: Not on file   Other Topics Concern  . Not on file   Social History Narrative  . No narrative on file    Family History  Problem Relation Age of Onset  . Hyperlipidemia Mother   . Heart disease Mother   . Diabetes Mother   . Hypertension  Mother   . Heart disease Father   . Cancer Neg Hx     Current Outpatient Prescriptions on File Prior to Visit  Medication Sig Dispense Refill  . atenolol (TENORMIN) 25 MG tablet Take 1 tablet (25 mg total) by mouth daily.  30 tablet  5  . atorvastatin (LIPITOR) 40 MG tablet Take 1 tablet (40 mg total) by mouth daily.  30 tablet  3  . clobetasol cream (TEMOVATE) 0.05 % Apply topically 2 (two) times daily.  60 g  1  . dutasteride (AVODART) 0.5 MG capsule Take 1 capsule (0.5 mg total) by mouth daily.  30 capsule  6  . FREESTYLE LITE test strip Use to test blood sugar 1 to 2 times daily      . Lancets (FREESTYLE) lancets Use to check blood sugar 1-2 times daily      . levothyroxine (SYNTHROID, LEVOTHROID) 50 MCG tablet TAKE ONE TABLET BY MOUTH ONCE DAILY.  30 tablet  2  . metFORMIN (GLUCOPHAGE-XR) 500 MG 24 hr tablet Take 2 tablets (1,000 mg total) by mouth daily with breakfast.  60 tablet  6  . nicotine polacrilex (COMMIT) 2 MG lozenge Place 2 mg inside cheek as needed.      . triamcinolone  cream (KENALOG) 0.1 % Apply 1 application topically 2 (two) times daily.      . vardenafil (LEVITRA) 20 MG tablet Take 1 tablet (20 mg total) by mouth daily as needed for erectile dysfunction.  6 tablet  3   No current facility-administered medications on file prior to visit.    No Known Allergies  REVIEW OF SYSTEMS:  (Positives checked otherwise negative)  CARDIOVASCULAR:  [ ]  chest pain, [ ]  chest pressure, [ ]  palpitations, [ ]  shortness of breath when laying flat, [ ]  shortness of breath with exertion,   [x ] pain in feet when walking, [ x] pain in feet when laying flat, [ ]  history of blood clot in veins (DVT), [ ]  history of phlebitis, [ ]  swelling in legs, [ ]  varicose veins  PULMONARY:  [ ]  productive cough, [ ]  asthma, [ ]  wheezing  NEUROLOGIC:  [ ]  weakness in arms or legs, [ ]  numbness in arms or legs, [ ]  difficulty speaking or slurred speech, [ ]  temporary loss of vision in one eye, [ ]   dizziness  HEMATOLOGIC:  [ ]  bleeding problems, [ ]  problems with blood clotting too easily  MUSCULOSKEL:  [ ]  joint pain, [ ]  joint swelling  GASTROINTEST:  [ ]   Vomiting blood, [ ]   Blood in stool     GENITOURINARY:  [ ]   Burning with urination, [ ]   Blood in urine  PSYCHIATRIC:  [ ]  history of major depression  INTEGUMENTARY:  [ ]  rashes, [ ]  ulcers  CONSTITUTIONAL:  [ ]  fever, [ ]  chills  For VQI Use Only   PRE-ADM LIVING: Home  AMB STATUS: Ambulatory  CAD Sx: None  PRIOR CHF: None  STRESS TEST: [x]  No, [ ]  Normal, [ ]  + ischemia, [ ]  + MI, [ ]  Both  Physical Examination Filed Vitals:   02/23/13 1122  BP: 138/84  Pulse: 66  Height: 5' 11.5" (1.816 m)  Weight: 225 lb (102.059 kg)  SpO2: 98%   Body mass index is 30.95 kg/(m^2).  General: A&O x 3, WDWN  Head: [place]/AT  Ear/Nose/Throat: Hearing grossly intact, nares w/o erythema or drainage, oropharynx w/o Erythema/Exudate  Eyes: PERRLA, EOMI  Neck: Supple, no nuchal rigidity, no palpable LAD  Pulmonary: Sym exp, good air movt, CTAB, no rales, rhonchi, & wheezing  Cardiac: RRR, Nl S1, S2, no Murmurs, rubs or gallops  Vascular: Vessel Right Left  Radial Palpable Palpable  Brachial Faintly Palpable Faintly Palpable  Carotid Palpable, without bruit Palpable, without bruit  Aorta Not palpable N/A  Femoral Palpable Palpable  Popliteal Not palpable Not palpable  PT Not Palpable Not Palpable  DP Not Palpable Not Palpable   Gastrointestinal: soft, NTND, -G/R, - HSM, - masses, - CVAT B  Musculoskeletal: M/S 5/5 throughout , Extremities without ischemic changes except cyanotic toes R foot, some spider veins in both legs  Neurologic: CN 2-12 intact , Pain and light touch intact in extremities , Motor exam as listed above  Psychiatric: Judgment intact, Mood & affect appropriatefor pt's clinical situation  Dermatologic: See M/S exam for extremity exam, no rashes otherwise noted  Lymph : No Cervical,  Axillary, or Inguinal lymphadenopathy   Non-Invasive Vascular Imaging  ABI (Date: 02/23/2013)  R: 0.79 , DP: mono, PT: mono, TBI: 0.64  L: 0.93 , DP: bi, PT: mono, TBI: 0.58  Medical Decision Making  Harry Brown is a 72 y.o. male who presents with: BLE intermittent claudication.   I discussed with the  patient the natural history of intermittent claudication: 75% of patients have stable or improved symptoms in a year an only 2% require amputation. Eventually 20% may require intervention in a year.  I discussed in depth with the patient the nature of atherosclerosis, and emphasized the importance of maximal medical management including strict control of blood pressure, blood glucose, and lipid levels, antiplatelet agent, obtaining regular exercise, and cessation of smoking.    The patient is aware that without maximal medical management the underlying atherosclerotic disease process will progress, limiting the benefit of any interventions.  I discussed in depth with the patient a walking plan and how to execute such. The patient is currently on a statin: Lipitor.   The patient is currently not on an anti-platelet.  The patient will be started on ASA 81 mg PO daily. In diabetics, the non-invasive study tend to exaggerate the quality of blood flow due to medial calcification.  The TBI usually are relatively resistance to such.  This patient has nearly normal TBI. The patient, however, has sx c/w with rest pain, so I feel an aortogram, bilateral leg runoff, with possible right leg intervention is indicated.   I discussed with the patient the nature of angiographic procedures, especially the limited patencies of any endovascular intervention.  The patient is aware of that the risks of an angiographic procedure include but are not limited to: bleeding, infection, access site complications, renal failure, embolization, rupture of vessel, dissection, possible need for emergent surgical  intervention, possible need for surgical procedures to treat the patient's pathology, anaphylactic reaction to contrast, and stroke and death.   The patient is aware of the risks and agrees to proceed.  The patient will tell us when he is ready to proceed.  Thank you for allowing [place] to participate in this patient's care.  Leonides Sake, MD [facility] [place] Office: [phone] Pager: [phone]  02/23/2013, 1:00 PM

## 2013-02-26 ENCOUNTER — Other Ambulatory Visit: Payer: Self-pay | Admitting: *Deleted

## 2013-02-26 ENCOUNTER — Ambulatory Visit (INDEPENDENT_AMBULATORY_CARE_PROVIDER_SITE_OTHER): Payer: Medicare Other | Admitting: Podiatry

## 2013-02-26 ENCOUNTER — Encounter: Payer: Self-pay | Admitting: Podiatry

## 2013-02-26 ENCOUNTER — Encounter: Payer: Self-pay | Admitting: *Deleted

## 2013-02-26 VITALS — BP 129/77 | HR 70 | Ht 72.0 in | Wt 225.0 lb

## 2013-02-26 DIAGNOSIS — M79673 Pain in unspecified foot: Secondary | ICD-10-CM | POA: Insufficient documentation

## 2013-02-26 DIAGNOSIS — M79609 Pain in unspecified limb: Secondary | ICD-10-CM

## 2013-02-26 DIAGNOSIS — B351 Tinea unguium: Secondary | ICD-10-CM

## 2013-02-26 DIAGNOSIS — I739 Peripheral vascular disease, unspecified: Secondary | ICD-10-CM

## 2013-02-26 MED ORDER — METFORMIN HCL ER 500 MG PO TB24: 1000.0000 mg | ORAL_TABLET | Freq: Every day | ORAL | Status: DC

## 2013-02-26 MED ORDER — DUTASTERIDE 0.5 MG PO CAPS: 0.5000 mg | ORAL_CAPSULE | Freq: Every day | ORAL | Status: DC

## 2013-02-26 NOTE — Patient Instructions (Addendum)
Seen for diabetic foot care. Debrided all nails.  Use vinegar soak and Vics vapor rub as needed. Return in 3 months or as needed.

## 2013-02-26 NOTE — Progress Notes (Signed)
Subjective:  Been to vascular lab and found blockage. He is been monitored for the up coming treatment. Today he is here for routine foot care. Blood sugar was 110 this morning.   Objective: Pedal pulses are not palpable bilateral DP and PT. Right foot is cooler than the left. Right side sensory response to monofilament wire is weak and poor. Thick yellow fungal nails on both great toes. No open lesions noted. No trophic changes seen.  Assessment: PVD, NIDDM Onychomycosis.  Plan: Debrided all nails.

## 2013-02-26 NOTE — Telephone Encounter (Signed)
Rx request to pharmacy/SLS  

## 2013-03-02 ENCOUNTER — Encounter (HOSPITAL_COMMUNITY): Payer: Self-pay | Admitting: Pharmacy Technician

## 2013-03-06 ENCOUNTER — Telehealth: Payer: Self-pay

## 2013-03-06 NOTE — Telephone Encounter (Signed)
pts spouse left a message stating that pt is having a procedure done on Oct 9th for a blocked artery in leg. They are wandering if he should stop his baby aspirin?  Please advise?

## 2013-03-06 NOTE — Telephone Encounter (Signed)
So they should discuss this with the surgeon because they will direct him according to their policies but I certainly feel comfortable with him stopping his Aspirin a few days prior to the procedure

## 2013-03-06 NOTE — Telephone Encounter (Signed)
Left a detailed message on v/m

## 2013-03-15 ENCOUNTER — Ambulatory Visit (HOSPITAL_COMMUNITY)
Admission: RE | Admit: 2013-03-15 | Discharge: 2013-03-15 | Disposition: A | Discharge status: Home / Self Care | Payer: Medicare Other | Source: Ambulatory Visit | Attending: Vascular Surgery | Admitting: Vascular Surgery

## 2013-03-15 ENCOUNTER — Telehealth: Payer: Self-pay | Admitting: Vascular Surgery

## 2013-03-15 ENCOUNTER — Encounter (HOSPITAL_COMMUNITY)
Admission: RE | Disposition: A | Discharge status: Home / Self Care | Payer: Self-pay | Source: Ambulatory Visit | Attending: Vascular Surgery

## 2013-03-15 ENCOUNTER — Other Ambulatory Visit: Payer: Self-pay | Admitting: *Deleted

## 2013-03-15 DIAGNOSIS — Z01810 Encounter for preprocedural cardiovascular examination: Secondary | ICD-10-CM

## 2013-03-15 DIAGNOSIS — Z951 Presence of aortocoronary bypass graft: Secondary | ICD-10-CM | POA: Insufficient documentation

## 2013-03-15 DIAGNOSIS — I2510 Atherosclerotic heart disease of native coronary artery without angina pectoris: Secondary | ICD-10-CM | POA: Insufficient documentation

## 2013-03-15 DIAGNOSIS — I70229 Atherosclerosis of native arteries of extremities with rest pain, unspecified extremity: Secondary | ICD-10-CM | POA: Insufficient documentation

## 2013-03-15 DIAGNOSIS — Z79899 Other long term (current) drug therapy: Secondary | ICD-10-CM | POA: Insufficient documentation

## 2013-03-15 DIAGNOSIS — E119 Type 2 diabetes mellitus without complications: Secondary | ICD-10-CM | POA: Insufficient documentation

## 2013-03-15 DIAGNOSIS — I739 Peripheral vascular disease, unspecified: Secondary | ICD-10-CM

## 2013-03-15 HISTORY — PX: ABDOMINAL AORTAGRAM: SHX5706

## 2013-03-15 HISTORY — PX: ABDOMINAL AORTAGRAM: SHX5454

## 2013-03-15 LAB — GLUCOSE, CAPILLARY
Glucose-Capillary: 117 mg/dL — ABNORMAL HIGH (ref 70–99)
Glucose-Capillary: 121 mg/dL — ABNORMAL HIGH (ref 70–99)

## 2013-03-15 LAB — POCT I-STAT, CHEM 8
BUN: 22 mg/dL (ref 6–23)
Calcium, Ion: 1.18 mmol/L (ref 1.13–1.30)
Chloride: 105 mEq/L (ref 96–112)
Creatinine, Ser: 1.1 mg/dL (ref 0.50–1.35)
Glucose, Bld: 130 mg/dL — ABNORMAL HIGH (ref 70–99)
HCT: 40 % (ref 39.0–52.0)
Hemoglobin: 13.6 g/dL (ref 13.0–17.0)
Potassium: 4.5 mEq/L (ref 3.5–5.1)
Sodium: 140 mEq/L (ref 135–145)
TCO2: 26 mmol/L (ref 0–100)

## 2013-03-15 SURGERY — ABDOMINAL AORTAGRAM
Anesthesia: LOCAL

## 2013-03-15 MED ORDER — HEPARIN (PORCINE) IN NACL 2-0.9 UNIT/ML-% IJ SOLN
INTRAMUSCULAR | Status: AC
  Filled 2013-03-15: qty 1000

## 2013-03-15 MED ORDER — SODIUM CHLORIDE 0.9 % IV SOLN: 1.0000 mL/kg/h | INTRAVENOUS | Status: DC

## 2013-03-15 MED ORDER — SODIUM CHLORIDE 0.9 % IV SOLN
INTRAVENOUS | Status: DC
  Administered 2013-03-15: 07:00:00 via INTRAVENOUS

## 2013-03-15 MED ORDER — ACETAMINOPHEN 325 MG PO TABS: 650.0000 mg | ORAL_TABLET | ORAL | Status: DC | PRN

## 2013-03-15 MED ORDER — ONDANSETRON HCL 4 MG/2ML IJ SOLN: 4.0000 mg | Freq: Four times a day (QID) | INTRAMUSCULAR | Status: DC | PRN

## 2013-03-15 MED ORDER — LIDOCAINE HCL (PF) 1 % IJ SOLN
INTRAMUSCULAR | Status: AC
  Filled 2013-03-15: qty 30

## 2013-03-15 MED ORDER — MIDAZOLAM HCL 2 MG/2ML IJ SOLN
INTRAMUSCULAR | Status: AC
  Filled 2013-03-15: qty 2

## 2013-03-15 MED ORDER — FENTANYL CITRATE 0.05 MG/ML IJ SOLN
INTRAMUSCULAR | Status: AC
  Filled 2013-03-15: qty 2

## 2013-03-15 NOTE — Interval H&P Note (Signed)
[facility]  History and Physical Update  The patient was interviewed and re-examined.  The patient's previous History and Physical has been reviewed and is unchanged.  There is no change in the plan of care: Aortogram, bilateral leg runoff, possible intervention R leg.  Leonides Sake, MD [facility] Office: [phone] Pager: [phone]  03/15/2013, 7:36 AM

## 2013-03-15 NOTE — Telephone Encounter (Addendum)
Message copied by Rosalyn Charters on Thu Mar 15, 2013  1:44 PM ------      Message from: Melene Plan      Created: Thu Mar 15, 2013 10:06 AM       Maybe combine the vein map with office visit after the cardiology evaluation; so he can discuss everything and schedule surgery.      Thanks      Darel Hong      ----- Message -----         From: Fransisco Hertz, MD         Sent: 03/15/2013   9:27 AM           To: [name], Melene Plan, RN            Harry Brown      [id number]      1940-07-11            PROCEDURE:      1.  Left common femoral artery cannulation under ultrasound guidance      2.  Placement of catheter in aorta      3.  Aortogram      4.  Second order arterial selection      5.  Right leg runoff      6.  Left leg runoff            Follow-up: 2-3 weeks after cardiology evaluation            Orders(s) for follow-up:       1.  BLE GSV vein mapping (prior R leg harvest)      2.  Cardiology preop evaluation for possible R fem-pop             ------  notified patient of appt. with dr. Jens Som, [phone]  on 04-03-13 at 11:45 and fu with dr. Imogene Burn on 04-13-13 at  10 am

## 2013-03-15 NOTE — Op Note (Signed)
OPERATIVE NOTE   PROCEDURE: 1.  Left common femoral artery cannulation under ultrasound guidance 2.  Placement of catheter in aorta 3.  Aortogram 4.  Second order arterial selection 5.  Right leg runoff 6.  Left leg runoff  PRE-OPERATIVE DIAGNOSIS: intermittent right rest pain  POST-OPERATIVE DIAGNOSIS: same as above   SURGEON: Harry Sake, MD  ANESTHESIA: conscious sedation  ESTIMATED BLOOD LOSS: 50 cc  CONTRAST: 200 cc  FINDING(S):  Aorta: Patent with some disease, mild ectasia distal (2.4 cm)  Superior mesenteric artery: not visualized Celiac artery: not visualized   Right Left  RA Patent Patent  CIA Patent but diffusely diseased, <50% stenosis Patent but diffusely diseased  EIA Patent but diffusely diseased Patent but diffusely diseased  IIA Patent Patent  CFA Patent but diffusely diseased Patent but diffusely diseased  SFA Flush occlusion at takeoff with distal reconstitution Patent proximally with reconstitution at level of popliteal artery  PFA Patent with 50% stenosis Patent  Pop Patent Patent  Trif Patent Patent  AT Patent proximally but occludes shortly after takeoff with dimunitive lumen Patent proximally but occludes shortly after takeoff with dimunitive lumen  Pero Patent, dominant runoff Patent but diseased  PT Patent but small Patent proximally but occludes shortly after takeoff with dimunitive lumen   SPECIMEN(S):  none  INDICATIONS:   Harry Brown is a 72 y.o. male who presents with bilateral intermittent claudication with intermittent right rest pain.  The patient presents for: aortogram, bilateral leg runoff, and possible intervention.  I discussed with the patient the nature of angiographic procedures, especially the limited patencies of any endovascular intervention.  The patient is aware of that the risks of an angiographic procedure include but are not limited to: bleeding, infection, access site complications, renal failure, embolization,  rupture of vessel, dissection, possible need for emergent surgical intervention, possible need for surgical procedures to treat the patient's pathology, and stroke and death.  The patient is aware of the risks and agrees to proceed.  DESCRIPTION: After full informed consent was obtained from the patient, the patient was brought back to the angiography suite.  The patient was placed supine upon the angiography table and connected to monitoring equipment.  The patient was then given conscious sedation, the amounts of which are documented in the patient's chart.  The patient was prepped and drape in the standard fashion for an angiographic procedure.  At this point, attention was turned to the left groin.  Under ultrasound guidance, the left common femoral artery was cannulated with a micropuncture needle.  The microwire was advanced into the iliac arterial system.  The needle was exchanged for a microsheath, which was loaded into the common femoral artery over the wire.  The microwire was exchanged for a [facility] wire which was advanced into the aorta.  The microsheath was then exchanged for a 5-Fr sheath which was loaded into the common femoral artery.  The Omniflush catheter was then loaded over the wire up to the level of L1.  The catheter was connected to the power injector circuit.  After de-airring and de-clotting the circuit, a power injector aortogram was completed.  The [facility] wire was replaced in the catheter, and using the [place] and Omniflush catheter, the right common iliac artery was selected.  Neither the wire nor the catheter would track down the right common iliac artery, so I replaced the wire with a Glidewire and advanced it into the right common femoral artery.  The catheter was exchanged for an end-hole  catheter which was advanced into the right external iliac artery.   The wire was removed and the catheter was connected to the power injector circuit.  An automated right leg runoff was completed.   The findings are listed above.  The catheter was removed.  The sheath was aspirated.  No clots were present and the sheath was reloaded with heparinized saline.   The left sheath was connected to the power injector circuit.  An automated left leg runoff was completed.  The findings are listed above.    Based on the images, no endovascular intervention on the right superficial femoral artery is likely to have lasting benefit.  The left superficial femoral artery might be able amendable to intervention.  COMPLICATIONS: none  CONDITION: stable  Harry Sake, MD [facility] [place] Office: [phone] Pager: [phone]  03/15/2013, 9:14 AM

## 2013-03-15 NOTE — H&P (View-Only) (Signed)
[facility] [place]  Referred by:  Bradd Canary, MD [address] [place], [place] [address]  Reason for referral: R>L leg pain  History of Present Illness  Harry Brown is a 72 y.o. (September 10, 1940) male who presents with chief complaint: R>L leg pain.  Onset of symptom occurred years ago of intermittent claudication.  Pain is described as R>L foot aching, mostly with activity and walking but rarely now at rest also in R foot, severity 3-6/10.  Patient has attempted to treat this pain with rest.  The patient has some rest pain symptoms also and no leg wounds/ulcers.   Pt notes some intermittent numbness in left leg.  By report his DM is under good controlled without neuropathy.  Atherosclerotic risk factors include: DM,. Former smoking, hyperlipidemia.  Past Medical History  Diagnosis Date  . Diabetes mellitus   . Diverticulitis   . History of sleep apnea   . CAD (coronary artery disease)   . Hypothyroidism   . ED (erectile dysfunction)   . Other and unspecified hyperlipidemia   . Neck pain on left side 11/22/2012  . Urinary urgency 11/22/2012    Past Surgical History  Procedure Laterality Date  . Coronary artery bypass graft  2001  . Tonsillectomy  2003  . Palate / uvula biopsy / excision  2003    Removed due to sleep apnea  . Cervical disc surgery      History   Social History  . Marital Status: Married    Spouse Name: N/A    Number of Children: 8   . Years of Education: N/A   Occupational History  .     Social History Main Topics  . Smoking status: Former Games developer  . Smokeless tobacco: Never Used     Comment: Uses Comit lozenges  . Alcohol Use: No  . Drug Use: Not on file  . Sexual Activity: Not on file   Other Topics Concern  . Not on file   Social History Narrative  . No narrative on file    Family History  Problem Relation Age of Onset  . Hyperlipidemia Mother   . Heart disease Mother   . Diabetes Mother   . Hypertension  Mother   . Heart disease Father   . Cancer Neg Hx     Current Outpatient Prescriptions on File Prior to Visit  Medication Sig Dispense Refill  . atenolol (TENORMIN) 25 MG tablet Take 1 tablet (25 mg total) by mouth daily.  30 tablet  5  . atorvastatin (LIPITOR) 40 MG tablet Take 1 tablet (40 mg total) by mouth daily.  30 tablet  3  . clobetasol cream (TEMOVATE) 0.05 % Apply topically 2 (two) times daily.  60 g  1  . dutasteride (AVODART) 0.5 MG capsule Take 1 capsule (0.5 mg total) by mouth daily.  30 capsule  6  . FREESTYLE LITE test strip Use to test blood sugar 1 to 2 times daily      . Lancets (FREESTYLE) lancets Use to check blood sugar 1-2 times daily      . levothyroxine (SYNTHROID, LEVOTHROID) 50 MCG tablet TAKE ONE TABLET BY MOUTH ONCE DAILY.  30 tablet  2  . metFORMIN (GLUCOPHAGE-XR) 500 MG 24 hr tablet Take 2 tablets (1,000 mg total) by mouth daily with breakfast.  60 tablet  6  . nicotine polacrilex (COMMIT) 2 MG lozenge Place 2 mg inside cheek as needed.      . triamcinolone  cream (KENALOG) 0.1 % Apply 1 application topically 2 (two) times daily.      . vardenafil (LEVITRA) 20 MG tablet Take 1 tablet (20 mg total) by mouth daily as needed for erectile dysfunction.  6 tablet  3   No current facility-administered medications on file prior to visit.    No Known Allergies  REVIEW OF SYSTEMS:  (Positives checked otherwise negative)  CARDIOVASCULAR:  [ ]  chest pain, [ ]  chest pressure, [ ]  palpitations, [ ]  shortness of breath when laying flat, [ ]  shortness of breath with exertion,   [x ] pain in feet when walking, [ x] pain in feet when laying flat, [ ]  history of blood clot in veins (DVT), [ ]  history of phlebitis, [ ]  swelling in legs, [ ]  varicose veins  PULMONARY:  [ ]  productive cough, [ ]  asthma, [ ]  wheezing  NEUROLOGIC:  [ ]  weakness in arms or legs, [ ]  numbness in arms or legs, [ ]  difficulty speaking or slurred speech, [ ]  temporary loss of vision in one eye, [ ]   dizziness  HEMATOLOGIC:  [ ]  bleeding problems, [ ]  problems with blood clotting too easily  MUSCULOSKEL:  [ ]  joint pain, [ ]  joint swelling  GASTROINTEST:  [ ]   Vomiting blood, [ ]   Blood in stool     GENITOURINARY:  [ ]   Burning with urination, [ ]   Blood in urine  PSYCHIATRIC:  [ ]  history of major depression  INTEGUMENTARY:  [ ]  rashes, [ ]  ulcers  CONSTITUTIONAL:  [ ]  fever, [ ]  chills  For VQI Use Only   PRE-ADM LIVING: Home  AMB STATUS: Ambulatory  CAD Sx: None  PRIOR CHF: None  STRESS TEST: [x]  No, [ ]  Normal, [ ]  + ischemia, [ ]  + MI, [ ]  Both  Physical Examination Filed Vitals:   02/23/13 1122  BP: 138/84  Pulse: 66  Height: 5' 11.5" (1.816 m)  Weight: 225 lb (102.059 kg)  SpO2: 98%   Body mass index is 30.95 kg/(m^2).  General: A&O x 3, WDWN  Head: [place]/AT  Ear/Nose/Throat: Hearing grossly intact, nares w/o erythema or drainage, oropharynx w/o Erythema/Exudate  Eyes: PERRLA, EOMI  Neck: Supple, no nuchal rigidity, no palpable LAD  Pulmonary: Sym exp, good air movt, CTAB, no rales, rhonchi, & wheezing  Cardiac: RRR, Nl S1, S2, no Murmurs, rubs or gallops  Vascular: Vessel Right Left  Radial Palpable Palpable  Brachial Faintly Palpable Faintly Palpable  Carotid Palpable, without bruit Palpable, without bruit  Aorta Not palpable N/A  Femoral Palpable Palpable  Popliteal Not palpable Not palpable  PT Not Palpable Not Palpable  DP Not Palpable Not Palpable   Gastrointestinal: soft, NTND, -G/R, - HSM, - masses, - CVAT B  Musculoskeletal: M/S 5/5 throughout , Extremities without ischemic changes except cyanotic toes R foot, some spider veins in both legs  Neurologic: CN 2-12 intact , Pain and light touch intact in extremities , Motor exam as listed above  Psychiatric: Judgment intact, Mood & affect appropriatefor pt's clinical situation  Dermatologic: See M/S exam for extremity exam, no rashes otherwise noted  Lymph : No Cervical,  Axillary, or Inguinal lymphadenopathy   Non-Invasive Vascular Imaging  ABI (Date: 02/23/2013)  R: 0.79 , DP: mono, PT: mono, TBI: 0.64  L: 0.93 , DP: bi, PT: mono, TBI: 0.58  Medical Decision Making  JUSTINIAN MIANO is a 72 y.o. male who presents with: BLE intermittent claudication.   I discussed with the  patient the natural history of intermittent claudication: 75% of patients have stable or improved symptoms in a year an only 2% require amputation. Eventually 20% may require intervention in a year.  I discussed in depth with the patient the nature of atherosclerosis, and emphasized the importance of maximal medical management including strict control of blood pressure, blood glucose, and lipid levels, antiplatelet agent, obtaining regular exercise, and cessation of smoking.    The patient is aware that without maximal medical management the underlying atherosclerotic disease process will progress, limiting the benefit of any interventions.  I discussed in depth with the patient a walking plan and how to execute such. The patient is currently on a statin: Lipitor.   The patient is currently not on an anti-platelet.  The patient will be started on ASA 81 mg PO daily. In diabetics, the non-invasive study tend to exaggerate the quality of blood flow due to medial calcification.  The TBI usually are relatively resistance to such.  This patient has nearly normal TBI. The patient, however, has sx c/w with rest pain, so I feel an aortogram, bilateral leg runoff, with possible right leg intervention is indicated.   I discussed with the patient the nature of angiographic procedures, especially the limited patencies of any endovascular intervention.  The patient is aware of that the risks of an angiographic procedure include but are not limited to: bleeding, infection, access site complications, renal failure, embolization, rupture of vessel, dissection, possible need for emergent surgical  intervention, possible need for surgical procedures to treat the patient's pathology, anaphylactic reaction to contrast, and stroke and death.   The patient is aware of the risks and agrees to proceed.  The patient will tell us when he is ready to proceed.  Thank you for allowing [place] to participate in this patient's care.  Leonides Sake, MD [facility] [place] Office: [phone] Pager: [phone]  02/23/2013, 1:00 PM

## 2013-03-20 ENCOUNTER — Ambulatory Visit (INDEPENDENT_AMBULATORY_CARE_PROVIDER_SITE_OTHER): Payer: Medicare Other | Admitting: Family Medicine

## 2013-03-20 ENCOUNTER — Encounter: Payer: Self-pay | Admitting: Family Medicine

## 2013-03-20 ENCOUNTER — Other Ambulatory Visit: Payer: Self-pay | Admitting: Family Medicine

## 2013-03-20 ENCOUNTER — Telehealth: Payer: Self-pay | Admitting: Family Medicine

## 2013-03-20 VITALS — BP 102/72 | HR 80 | Temp 98.4°F | Ht 72.0 in | Wt 224.1 lb

## 2013-03-20 DIAGNOSIS — E119 Type 2 diabetes mellitus without complications: Secondary | ICD-10-CM

## 2013-03-20 DIAGNOSIS — E785 Hyperlipidemia, unspecified: Secondary | ICD-10-CM

## 2013-03-20 DIAGNOSIS — E039 Hypothyroidism, unspecified: Secondary | ICD-10-CM

## 2013-03-20 DIAGNOSIS — R350 Frequency of micturition: Secondary | ICD-10-CM

## 2013-03-20 DIAGNOSIS — I25810 Atherosclerosis of coronary artery bypass graft(s) without angina pectoris: Secondary | ICD-10-CM

## 2013-03-20 DIAGNOSIS — I739 Peripheral vascular disease, unspecified: Secondary | ICD-10-CM

## 2013-03-20 DIAGNOSIS — N400 Enlarged prostate without lower urinary tract symptoms: Secondary | ICD-10-CM

## 2013-03-20 DIAGNOSIS — Z23 Encounter for immunization: Secondary | ICD-10-CM

## 2013-03-20 LAB — CBC
HCT: 38.4 % — ABNORMAL LOW (ref 39.0–52.0)
Hemoglobin: 13.2 g/dL (ref 13.0–17.0)
MCH: 30 pg (ref 26.0–34.0)
MCHC: 34.4 g/dL (ref 30.0–36.0)
MCV: 87.3 fL (ref 78.0–100.0)
Platelets: 292 10*3/uL (ref 150–400)
RBC: 4.4 MIL/uL (ref 4.22–5.81)
RDW: 13.7 % (ref 11.5–15.5)
WBC: 9.1 10*3/uL (ref 4.0–10.5)

## 2013-03-20 NOTE — Progress Notes (Signed)
Patient ID: Harry Brown, male   DOB: 06-07-1941, 72 y.o.   MRN: [id number] Harry Brown [id number] [birth year] 03/20/2013      Progress Note-Follow Up  Subjective  Chief Complaint  Chief Complaint  Patient presents with  . Follow-up    4 month  . Injections    flu- high dose, pneumonia    HPI  Patient is a 72 year old Caucasian male in today for followup. He is generally doing well. She's not had any recent illness. He is scheduled for bypass surgery in a couple of weeks and is anxious to proceed. No new swelling or pain. No shortness of breath, chest pain, recent illness, fevers, headaches, GI or GU complaints. Is taking medications as prescribed.  Past Medical History  Diagnosis Date  . Diabetes mellitus   . Diverticulitis   . History of sleep apnea   . CAD (coronary artery disease)   . Hypothyroidism   . ED (erectile dysfunction)   . Other and unspecified hyperlipidemia   . Neck pain on left side 11/22/2012  . Urinary urgency 11/22/2012    Past Surgical History  Procedure Laterality Date  . Coronary artery bypass graft  2001  . Tonsillectomy  2003  . Palate / uvula biopsy / excision  2003    Removed due to sleep apnea  . Cervical disc surgery      Family History  Problem Relation Age of Onset  . Hyperlipidemia Mother   . Heart disease Mother   . Diabetes Mother   . Hypertension Mother   . Heart disease Father   . Cancer Neg Hx     History   Social History  . Marital Status: Married    Spouse Name: N/A    Number of Children: 8   . Years of Education: N/A   Occupational History  .     Social History Main Topics  . Smoking status: Former Games developer  . Smokeless tobacco: Never Used     Comment: Uses Comit lozenges  . Alcohol Use: No  . Drug Use: Not on file  . Sexual Activity: Not on file   Other Topics Concern  . Not on file   Social History Narrative  . No narrative on file    Current Outpatient Prescriptions on File Prior to Visit   Medication Sig Dispense Refill  . aspirin EC 81 MG tablet Take 81 mg by mouth daily.      Marland Kitchen atenolol (TENORMIN) 25 MG tablet Take 1 tablet (25 mg total) by mouth daily.  30 tablet  5  . atorvastatin (LIPITOR) 40 MG tablet Take 1 tablet (40 mg total) by mouth daily.  30 tablet  3  . dutasteride (AVODART) 0.5 MG capsule Take 1 capsule (0.5 mg total) by mouth daily.  30 capsule  3  . FREESTYLE LITE test strip Use to test blood sugar 1 to 2 times daily      . Lancets (FREESTYLE) lancets Use to check blood sugar 1-2 times daily      . levothyroxine (SYNTHROID, LEVOTHROID) 50 MCG tablet TAKE ONE TABLET BY MOUTH ONCE DAILY.  30 tablet  2  . metFORMIN (GLUCOPHAGE-XR) 500 MG 24 hr tablet Take 2 tablets (1,000 mg total) by mouth daily with breakfast.  60 tablet  3  . Multiple Vitamin (MULTIVITAMIN WITH MINERALS) TABS tablet Take 1 tablet by mouth daily.       No current facility-administered medications on file prior to visit.    No  Known Allergies  Review of Systems  Review of Systems  Constitutional: Negative for fever and malaise/fatigue.  HENT: Negative for congestion.   Eyes: Negative for discharge.  Respiratory: Negative for shortness of breath.   Cardiovascular: Negative for chest pain, palpitations and leg swelling.  Gastrointestinal: Negative for nausea, abdominal pain and diarrhea.  Genitourinary: Negative for dysuria.  Musculoskeletal: Negative for falls.  Skin: Negative for rash.  Neurological: Negative for loss of consciousness and headaches.  Endo/Heme/Allergies: Negative for polydipsia.  Psychiatric/Behavioral: Negative for depression and suicidal ideas. The patient is not nervous/anxious and does not have insomnia.     Objective  BP 102/72  Pulse 80  Temp(Src) 98.4 F (36.9 C) (Oral)  Ht 6' (1.829 m)  Wt 224 lb 1.9 oz (101.66 kg)  BMI 30.39 kg/m2  SpO2 98%  Physical Exam  Physical Exam  Constitutional: He is oriented to person, place, and time and  well-developed, well-nourished, and in no distress. No distress.  HENT:  Head: Normocephalic and atraumatic.  Eyes: Conjunctivae are normal.  Neck: Neck supple. No thyromegaly present.  Cardiovascular: Normal rate, regular rhythm and normal heart sounds.   No murmur heard. Pulmonary/Chest: Effort normal and breath sounds normal. No respiratory distress.  Abdominal: He exhibits no distension and no mass. There is no tenderness.  Musculoskeletal: He exhibits no edema.  Neurological: He is alert and oriented to person, place, and time.  Skin: Skin is warm.  Psychiatric: Memory, affect and judgment normal.    Lab Results  Component Value Date   TSH 3.724 12/16/2011   Lab Results  Component Value Date   WBC 9.8 11/20/2012   HGB 13.6 03/15/2013   HCT 40.0 03/15/2013   MCV 87.8 11/20/2012   PLT 261 11/20/2012   Lab Results  Component Value Date   CREATININE 1.10 03/15/2013   BUN 22 03/15/2013   NA 140 03/15/2013   K 4.5 03/15/2013   CL 105 03/15/2013   CO2 27 11/20/2012   Lab Results  Component Value Date   ALT 26 11/20/2012   AST 23 11/20/2012   ALKPHOS 50 11/20/2012   BILITOT 0.4 11/20/2012   Lab Results  Component Value Date   CHOL 137 11/20/2012   Lab Results  Component Value Date   HDL 43 11/20/2012   Lab Results  Component Value Date   LDLCALC 66 11/20/2012   Lab Results  Component Value Date   TRIG 138 11/20/2012   Lab Results  Component Value Date   CHOLHDL 3.2 11/20/2012     Assessment & Plan    Peripheral vascular disease, unspecified Is scheduled for bypass soon, is still struggling. Is anxious to proceed with procedure.  DM (diabetes mellitus) Patient reports recent good control, continue diabetic diet  Hypothyroidism Adequately controlled on current meds, no changes.

## 2013-03-20 NOTE — Patient Instructions (Signed)

## 2013-03-20 NOTE — Telephone Encounter (Signed)
Labs prior to visit lipid, renal, cbc, tsh, hepatic, hgba1c   Patient has appointment end of February/2015 and will be going to [facility] lab.

## 2013-03-21 LAB — RENAL FUNCTION PANEL
Albumin: 3.9 g/dL (ref 3.5–5.2)
BUN: 25 mg/dL — ABNORMAL HIGH (ref 6–23)
CO2: 28 mEq/L (ref 19–32)
Calcium: 9.4 mg/dL (ref 8.4–10.5)
Chloride: 104 mEq/L (ref 96–112)
Creat: 0.97 mg/dL (ref 0.50–1.35)
Glucose, Bld: 102 mg/dL — ABNORMAL HIGH (ref 70–99)
Phosphorus: 4 mg/dL (ref 2.3–4.6)
Potassium: 4.9 mEq/L (ref 3.5–5.3)
Sodium: 136 mEq/L (ref 135–145)

## 2013-03-21 LAB — LIPID PANEL
Cholesterol: 119 mg/dL (ref 0–200)
HDL: 40 mg/dL (ref >39)
LDL Cholesterol: 60 mg/dL (ref 0–99)
Total CHOL/HDL Ratio: 3 Ratio
Triglycerides: 96 mg/dL (ref <150)
VLDL: 19 mg/dL (ref 0–40)

## 2013-03-21 LAB — HEPATIC FUNCTION PANEL
ALT: 21 U/L (ref 0–53)
AST: 19 U/L (ref 0–37)
Albumin: 3.9 g/dL (ref 3.5–5.2)
Alkaline Phosphatase: 57 U/L (ref 39–117)
Bilirubin, Direct: 0.1 mg/dL (ref 0.0–0.3)
Indirect Bilirubin: 0.4 mg/dL (ref 0.0–0.9)
Total Bilirubin: 0.5 mg/dL (ref 0.3–1.2)
Total Protein: 6.5 g/dL (ref 6.0–8.3)

## 2013-03-21 LAB — URINALYSIS
Bilirubin Urine: NEGATIVE
Glucose, UA: NEGATIVE mg/dL
Hgb urine dipstick: NEGATIVE
Ketones, ur: NEGATIVE mg/dL
Leukocytes, UA: NEGATIVE
Nitrite: NEGATIVE
Protein, ur: NEGATIVE mg/dL
Specific Gravity, Urine: 1.015 (ref 1.005–1.030)
Urobilinogen, UA: 1 mg/dL (ref 0.0–1.0)
pH: 6 (ref 5.0–8.0)

## 2013-03-21 LAB — TSH: TSH: 3.562 u[IU]/mL (ref 0.350–4.500)

## 2013-03-21 LAB — HEMOGLOBIN A1C
Hgb A1c MFr Bld: 6.8 % — ABNORMAL HIGH (ref <5.7)
Mean Plasma Glucose: 148 mg/dL — ABNORMAL HIGH (ref <117)

## 2013-03-21 LAB — URINE CULTURE
Colony Count: NO GROWTH
Organism ID, Bacteria: NO GROWTH

## 2013-03-24 ENCOUNTER — Encounter: Payer: Self-pay | Admitting: Family Medicine

## 2013-03-24 NOTE — Assessment & Plan Note (Signed)
Is scheduled for bypass soon, is still struggling. Is anxious to proceed with procedure.

## 2013-03-24 NOTE — Assessment & Plan Note (Signed)
Patient reports recent good control, continue diabetic diet

## 2013-03-24 NOTE — Assessment & Plan Note (Signed)
Adequately controlled on current meds, no changes.

## 2013-03-28 ENCOUNTER — Other Ambulatory Visit: Payer: Self-pay | Admitting: *Deleted

## 2013-03-28 MED ORDER — LEVOTHYROXINE SODIUM 50 MCG PO TABS: ORAL_TABLET | ORAL | Status: DC

## 2013-03-28 NOTE — Telephone Encounter (Signed)
Rx request to pharmacy/SLS  

## 2013-04-03 ENCOUNTER — Encounter: Payer: Self-pay | Admitting: Cardiology

## 2013-04-03 ENCOUNTER — Ambulatory Visit (INDEPENDENT_AMBULATORY_CARE_PROVIDER_SITE_OTHER): Payer: Medicare Other | Admitting: Cardiology

## 2013-04-03 VITALS — BP 138/78 | HR 66 | Ht 72.0 in | Wt 224.8 lb

## 2013-04-03 DIAGNOSIS — Z01810 Encounter for preprocedural cardiovascular examination: Secondary | ICD-10-CM

## 2013-04-03 DIAGNOSIS — I739 Peripheral vascular disease, unspecified: Secondary | ICD-10-CM

## 2013-04-03 DIAGNOSIS — I714 Abdominal aortic aneurysm, without rupture: Secondary | ICD-10-CM

## 2013-04-03 DIAGNOSIS — I2510 Atherosclerotic heart disease of native coronary artery without angina pectoris: Secondary | ICD-10-CM

## 2013-04-03 NOTE — Progress Notes (Signed)
HPI: FU coronary artery disease. The patient is status post cardiac catheterization in July of 2001. His ejection fraction was 53%. The patient has three-vessel coronary disease and underwent coronary artery bypassing graft in July of 2001 with a LIMA to the LAD, RIMA to the circumflex and saphenous vein graft to the PDA. Nuclear study in August of 2013 showed an ejection fraction of 57% and normal perfusion. Abdominal ultrasound in August of 2014 showed an aneurysm measuring 3 x 2.9 cm. Followup recommended in one year. Patient has known peripheral vascular disease followed by vascular surgery. Had recent arteriogram demonstrating occlusion of the right SFA and anterior tibial. Peripheral vascular surgery will be required. I last saw him in July of 2013. Since then, no dyspnea, chest pain, palpitations, syncope or pedal edema.   Current Outpatient Prescriptions  Medication Sig Dispense Refill  . aspirin EC 81 MG tablet Take 81 mg by mouth daily.      Marland Kitchen atenolol (TENORMIN) 25 MG tablet Take 1 tablet (25 mg total) by mouth daily.  30 tablet  5  . atorvastatin (LIPITOR) 40 MG tablet Take 1 tablet (40 mg total) by mouth daily.  30 tablet  3  . dutasteride (AVODART) 0.5 MG capsule Take 1 capsule (0.5 mg total) by mouth daily.  30 capsule  3  . FREESTYLE LITE test strip Use to test blood sugar 1 to 2 times daily      . Lancets (FREESTYLE) lancets Use to check blood sugar 1-2 times daily      . levothyroxine (SYNTHROID, LEVOTHROID) 50 MCG tablet TAKE ONE TABLET BY MOUTH ONCE DAILY.  30 tablet  2  . metFORMIN (GLUCOPHAGE-XR) 500 MG 24 hr tablet Take 2 tablets (1,000 mg total) by mouth daily with breakfast.  60 tablet  3  . Multiple Vitamin (MULTIVITAMIN WITH MINERALS) TABS tablet Take 1 tablet by mouth daily.       No current facility-administered medications for this visit.     Past Medical History  Diagnosis Date  . Diabetes mellitus   . Diverticulitis   . History of sleep apnea   . CAD  (coronary artery disease)   . Hypothyroidism   . ED (erectile dysfunction)   . Other and unspecified hyperlipidemia   . Neck pain on left side 11/22/2012  . Urinary urgency 11/22/2012    Past Surgical History  Procedure Laterality Date  . Coronary artery bypass graft  2001  . Tonsillectomy  2003  . Palate / uvula biopsy / excision  2003    Removed due to sleep apnea  . Cervical disc surgery      History   Social History  . Marital Status: Married    Spouse Name: N/A    Number of Children: 8   . Years of Education: N/A   Occupational History  .     Social History Main Topics  . Smoking status: Former Games developer  . Smokeless tobacco: Never Used     Comment: Uses Comit lozenges  . Alcohol Use: No  . Drug Use: Not on file  . Sexual Activity: Not on file   Other Topics Concern  . Not on file   Social History Narrative  . No narrative on file    ROS: claudication but no fevers or chills, productive cough, hemoptysis, dysphasia, odynophagia, melena, hematochezia, dysuria, hematuria, rash, seizure activity, orthopnea, PND, pedal edema. Remaining systems are negative.  Physical Exam: Well-developed well-nourished in no acute distress.  Skin is  warm and dry.  HEENT is normal.  Neck is supple.  Chest is clear to auscultation with normal expansion.  Cardiovascular exam is regular rate and rhythm.  Abdominal exam nontender or distended. No masses palpated. Extremities show no edema. neuro grossly intact  ECG 03/16/2013-sinus rhythm with prior inferior infarct.

## 2013-04-03 NOTE — Assessment & Plan Note (Signed)
Continue aspirin and statin. 

## 2013-04-03 NOTE — Patient Instructions (Signed)
Your physician recommends that you schedule a follow-up appointment in: 3 MONTHS WITH DR CRENSHAW  

## 2013-04-03 NOTE — Assessment & Plan Note (Signed)
Patient is scheduled for peripheral vascular surgery. Recent nuclear study negative. No recent symptoms. He may proceed without further cardiac evaluation.

## 2013-04-03 NOTE — Assessment & Plan Note (Signed)
Repeat ultrasound August 2015.

## 2013-04-12 ENCOUNTER — Encounter: Payer: Self-pay | Admitting: Vascular Surgery

## 2013-04-13 ENCOUNTER — Ambulatory Visit (HOSPITAL_COMMUNITY)
Admit: 2013-04-13 | Discharge: 2013-04-13 | Disposition: A | Discharge status: Home / Self Care | Payer: Medicare Other | Source: Ambulatory Visit | Attending: Vascular Surgery | Admitting: Vascular Surgery

## 2013-04-13 ENCOUNTER — Encounter: Payer: Self-pay | Admitting: Vascular Surgery

## 2013-04-13 ENCOUNTER — Ambulatory Visit (INDEPENDENT_AMBULATORY_CARE_PROVIDER_SITE_OTHER): Payer: Medicare Other | Admitting: Vascular Surgery

## 2013-04-13 VITALS — BP 114/80 | HR 69 | Resp 16 | Ht 72.0 in | Wt 224.0 lb

## 2013-04-13 DIAGNOSIS — Z01810 Encounter for preprocedural cardiovascular examination: Secondary | ICD-10-CM

## 2013-04-13 DIAGNOSIS — I70219 Atherosclerosis of native arteries of extremities with intermittent claudication, unspecified extremity: Secondary | ICD-10-CM

## 2013-04-13 DIAGNOSIS — I739 Peripheral vascular disease, unspecified: Secondary | ICD-10-CM | POA: Insufficient documentation

## 2013-04-13 DIAGNOSIS — Z0000 Encounter for general adult medical examination without abnormal findings: Secondary | ICD-10-CM

## 2013-04-13 HISTORY — DX: Encounter for general adult medical examination without abnormal findings: Z00.00

## 2013-04-13 NOTE — Progress Notes (Addendum)
[facility] [place]  Established Intermittent Claudication  History of Present Illness  Harry Brown is a 72 y.o. (Mar 09, 1941) male who presents with chief complaint: continue R>L short distance claudication.  The patient's symptoms have not progressed.  The patient's symptoms are: lifestyle limiting short distance claudication.  The patient's treatment regimen currently included: maximal medical management and walking plan.  On previous angiogram, the patient has bilateral SFA occlusion: R not amendable to endovascular intervention, L possibly amendable. The patient was already evaluated by his cardiologist: Dr. Jens Som and cleared for bypass surgery.    Past Medical History  Diagnosis Date  . Diabetes mellitus   . Diverticulitis   . History of sleep apnea   . CAD (coronary artery disease)   . Hypothyroidism   . ED (erectile dysfunction)   . Other and unspecified hyperlipidemia   . Neck pain on left side 11/22/2012  . Urinary urgency 11/22/2012    Past Surgical History  Procedure Laterality Date  . Coronary artery bypass graft  2001  . Tonsillectomy  2003  . Palate / uvula biopsy / excision  2003    Removed due to sleep apnea  . Cervical disc surgery    . Abdominal aortagram  Oct. 9, 2014    Dr. Imogene Burn    History   Social History  . Marital Status: Married    Spouse Name: N/A    Number of Children: 8   . Years of Education: N/A   Occupational History  .     Social History Main Topics  . Smoking status: Former Games developer  . Smokeless tobacco: Never Used     Comment: Uses Comit lozenges  . Alcohol Use: No  . Drug Use: Not on file  . Sexual Activity: Not on file   Other Topics Concern  . Not on file   Social History Narrative  . No narrative on file    Family History  Problem Relation Age of Onset  . Hyperlipidemia Mother   . Heart disease Mother   . Diabetes Mother   . Hypertension Mother   . Heart disease Father   . Cancer Neg Hx      Current Outpatient Prescriptions on File Prior to Visit  Medication Sig Dispense Refill  . aspirin EC 81 MG tablet Take 81 mg by mouth daily.      Marland Kitchen atenolol (TENORMIN) 25 MG tablet Take 1 tablet (25 mg total) by mouth daily.  30 tablet  5  . atorvastatin (LIPITOR) 40 MG tablet Take 1 tablet (40 mg total) by mouth daily.  30 tablet  3  . dutasteride (AVODART) 0.5 MG capsule Take 1 capsule (0.5 mg total) by mouth daily.  30 capsule  3  . FREESTYLE LITE test strip Use to test blood sugar 1 to 2 times daily      . Lancets (FREESTYLE) lancets Use to check blood sugar 1-2 times daily      . levothyroxine (SYNTHROID, LEVOTHROID) 50 MCG tablet TAKE ONE TABLET BY MOUTH ONCE DAILY.  30 tablet  2  . metFORMIN (GLUCOPHAGE-XR) 500 MG 24 hr tablet Take 2 tablets (1,000 mg total) by mouth daily with breakfast.  60 tablet  3  . Multiple Vitamin (MULTIVITAMIN WITH MINERALS) TABS tablet Take 1 tablet by mouth daily.       No current facility-administered medications on file prior to visit.    No Known Allergies  REVIEW OF SYSTEMS: (Positives checked otherwise negative)  CARDIOVASCULAR: [ ]   chest pain, [ ]  chest pressure, [ ]  palpitations, [ ]  shortness of breath when laying flat, [ ]  shortness of breath with exertion, [x ] pain in feet when walking, [ x] pain in feet when laying flat, [ ]  history of blood clot in veins (DVT), [ ]  history of phlebitis, [ ]  swelling in legs, [ ]  varicose veins  PULMONARY: [ ]  productive cough, [ ]  asthma, [ ]  wheezing  NEUROLOGIC: [ ]  weakness in arms or legs, [ ]  numbness in arms or legs, [ ]  difficulty speaking or slurred speech, [ ]  temporary loss of vision in one eye, [ ]  dizziness  HEMATOLOGIC: [ ]  bleeding problems, [ ]  problems with blood clotting too easily  MUSCULOSKEL: [ ]  joint pain, [ ]  joint swelling  GASTROINTEST: [ ]  Vomiting blood, [ ]  Blood in stool  GENITOURINARY: [ ]  Burning with urination, [ ]  Blood in urine  PSYCHIATRIC: [ ]  history of major  depression  INTEGUMENTARY: [ ]  rashes, [ ]  ulcers  CONSTITUTIONAL: [ ]  fever, [ ]  chills   For VQI Use Only  PRE-ADM LIVING: Home  AMB STATUS: Ambulatory  CAD Sx: None  PRIOR CHF: None  STRESS TEST: [x]  No, [ ]  Normal, [ ]  + ischemia, [ ]  + MI, [ ]  Both  Physical Examination  Filed Vitals:   04/13/13 1035  BP: 114/80  Pulse: 69  Resp: 16  Height: 6' (1.829 m)  Weight: 224 lb (101.606 kg)  SpO2: 96%   Body mass index is 30.37 kg/(m^2).  General: A&O x 3, WDWN   Pulmonary: Sym exp, good air movt, CTAB, no rales, rhonchi, & wheezing   Cardiac: RRR, Nl S1, S2, no Murmurs, rubs or gallops   Vascular:  Vessel  Right  Left   Radial  Palpable  Palpable   Brachial  Faintly Palpable  Faintly Palpable   Carotid  Palpable, without bruit  Palpable, without bruit   Aorta  Not palpable  N/A   Femoral  Palpable  Palpable   Popliteal  Not palpable  Not palpable   PT  Not Palpable  Not Palpable   DP  Not Palpable  Not Palpable    Gastrointestinal: soft, NTND, -G/R, - HSM, - masses, - CVAT B   Musculoskeletal: M/S 5/5 throughout , Extremities without ischemic changes except cyanotic toes R foot, some spider veins in both legs   Neurologic: CN 2-12 intact , Pain and light touch intact in extremities , Motor exam as listed above   B GSV mapping (04/13/2013)  Likely acceptable R GSV conduit: branching in mid-thigh with 4.2-4.7 mm branchs   Acceptable L conduit to calf  Medical Decision Making  Harry Brown is a 71 y.o. male who presents with:  right > left leg lifestyle limiting intermittent claudication without evidence of critical limb ischemia.  The patient feels that his claudication is interfering with his ability to work, as he has drive long distances and walk at the end of the drives.  Based on the patient's vascular studies and examination, I have offered the patient: R fem-AK pop bypass preferable with R GSV, though L GSV harvest may need to be completed.  This  procedure is scheduled on 19 NOV 14.  I discussed in depth with the patient the nature of atherosclerosis, and emphasized the importance of maximal medical management including strict control of blood pressure, blood glucose, and lipid levels, antiplatelet agents, obtaining regular exercise, and cessation of smoking.    The patient  is aware that without maximal medical management the underlying atherosclerotic disease process will progress, limiting the benefit of any interventions. The patient is currently on a statin: Lipitor The patient is currently on an anti-platelet: ASA  Thank you for allowing [place] to participate in this patient's care.  Leonides Sake, MD [facility] [place] Office: [phone] Pager: [phone]  04/13/2013, 4:28 PM

## 2013-04-17 ENCOUNTER — Other Ambulatory Visit: Payer: Self-pay

## 2013-04-19 ENCOUNTER — Encounter (HOSPITAL_COMMUNITY): Payer: Self-pay | Admitting: Pharmacy Technician

## 2013-04-19 ENCOUNTER — Other Ambulatory Visit (HOSPITAL_COMMUNITY): Payer: Self-pay | Admitting: *Deleted

## 2013-04-19 NOTE — Pre-Procedure Instructions (Signed)
Harry Brown  04/19/2013   Your procedure is scheduled on:  Wednesday, April 25, 2013   Report to [facility] Entrance "A" at 6:30 AM.   Call this number if you have problems the morning of surgery: [phone]   Remember:   Do not eat food or drink liquids after midnight Tuesday, 04/24/13.   Take these medicines the morning of surgery with A SIP OF WATER: atenolol (TENORMIN),  levothyroxine (SYNTHROID, LEVOTHROID),  dutasteride (AVODART).  Stop all Vitamins as of today, 04/20/13.   Do not wear jewelry.  Do not wear lotions, powders, or cologne. You may wear deodorant.             Men may shave face and neck.  Do not bring valuables to the hospital.  [facility] is not responsible   for any belongings or valuables.               Contacts, dentures or bridgework may not be worn into surgery.  Leave suitcase in the car. After surgery it may be brought to your room.  For patients admitted to the hospital, discharge time is determined by your  treatment team.     Special Instructions: Shower using CHG 2 nights before surgery and the night before surgery.  If you shower the day of surgery use CHG.  Use special wash - you have one bottle of CHG for all showers.  You should use approximately 1/3 of the bottle for each shower.   Please read over the following fact sheets that you were given: Pain Booklet, Coughing and Deep Breathing, Blood Transfusion Information, MRSA Information and Surgical Site Infection Prevention

## 2013-04-20 ENCOUNTER — Encounter (HOSPITAL_COMMUNITY)
Admission: RE | Admit: 2013-04-20 | Discharge: 2013-04-20 | Disposition: A | Discharge status: Home / Self Care | Payer: Medicare Other | Source: Ambulatory Visit | Attending: Vascular Surgery | Admitting: Vascular Surgery

## 2013-04-20 ENCOUNTER — Encounter (HOSPITAL_COMMUNITY): Payer: Self-pay

## 2013-04-20 DIAGNOSIS — Z01812 Encounter for preprocedural laboratory examination: Secondary | ICD-10-CM | POA: Insufficient documentation

## 2013-04-20 DIAGNOSIS — Z01818 Encounter for other preprocedural examination: Secondary | ICD-10-CM | POA: Insufficient documentation

## 2013-04-20 HISTORY — DX: Acute myocardial infarction, unspecified: I21.9

## 2013-04-20 HISTORY — DX: Essential (primary) hypertension: I10

## 2013-04-20 LAB — CBC
HCT: 38.4 % — ABNORMAL LOW (ref 39.0–52.0)
Hemoglobin: 12.9 g/dL — ABNORMAL LOW (ref 13.0–17.0)
MCH: 30.1 pg (ref 26.0–34.0)
MCHC: 33.6 g/dL (ref 30.0–36.0)
MCV: 89.7 fL (ref 78.0–100.0)
Platelets: 240 10*3/uL (ref 150–400)
RBC: 4.28 MIL/uL (ref 4.22–5.81)
RDW: 13 % (ref 11.5–15.5)
WBC: 9.1 10*3/uL (ref 4.0–10.5)

## 2013-04-20 LAB — TYPE AND SCREEN
ABO/RH(D): O NEG
Antibody Screen: NEGATIVE

## 2013-04-20 LAB — PROTIME-INR
INR: 0.99 (ref 0.00–1.49)
Prothrombin Time: 12.9 seconds (ref 11.6–15.2)

## 2013-04-20 LAB — URINALYSIS, ROUTINE W REFLEX MICROSCOPIC
Bilirubin Urine: NEGATIVE
Glucose, UA: NEGATIVE mg/dL
Hgb urine dipstick: NEGATIVE
Ketones, ur: NEGATIVE mg/dL
Leukocytes, UA: NEGATIVE
Nitrite: NEGATIVE
Protein, ur: NEGATIVE mg/dL
Specific Gravity, Urine: 1.006 (ref 1.005–1.030)
Urobilinogen, UA: 1 mg/dL (ref 0.0–1.0)
pH: 6.5 (ref 5.0–8.0)

## 2013-04-20 LAB — COMPREHENSIVE METABOLIC PANEL
ALT: 24 U/L (ref 0–53)
AST: 24 U/L (ref 0–37)
Albumin: 3.5 g/dL (ref 3.5–5.2)
Alkaline Phosphatase: 64 U/L (ref 39–117)
BUN: 23 mg/dL (ref 6–23)
CO2: 26 mEq/L (ref 19–32)
Calcium: 9.3 mg/dL (ref 8.4–10.5)
Chloride: 104 mEq/L (ref 96–112)
Creatinine, Ser: 0.9 mg/dL (ref 0.50–1.35)
GFR calc Af Amer: >90 mL/min (ref >90)
GFR calc non Af Amer: 82 mL/min — ABNORMAL LOW (ref >90)
Glucose, Bld: 137 mg/dL — ABNORMAL HIGH (ref 70–99)
Potassium: 4.8 mEq/L (ref 3.5–5.1)
Sodium: 138 mEq/L (ref 135–145)
Total Bilirubin: 0.3 mg/dL (ref 0.3–1.2)
Total Protein: 6.8 g/dL (ref 6.0–8.3)

## 2013-04-20 LAB — SURGICAL PCR SCREEN
MRSA, PCR: NEGATIVE
Staphylococcus aureus: NEGATIVE

## 2013-04-20 LAB — ABO/RH: ABO/RH(D): O NEG

## 2013-04-20 LAB — APTT: aPTT: 35 seconds (ref 24–37)

## 2013-04-20 NOTE — Progress Notes (Signed)
Primary =- dr. Misty Stanley blyth Cardiologist - dr. Jens Som Stress test 2013 in epic, ekg oct 2014

## 2013-04-20 NOTE — Progress Notes (Signed)
04/20/13 0814  OBSTRUCTIVE SLEEP APNEA  Have you ever been diagnosed with sleep apnea through a sleep study? No  Do you snore loudly (loud enough to be heard through closed doors)?  0  Do you often feel tired, fatigued, or sleepy during the daytime? 0  Has anyone observed you stop breathing during your sleep? 1  Do you have, or are you being treated for high blood pressure? 1  BMI more than 35 kg/m2? 0  Age over 73 years old? 1  Neck circumference greater than 40 cm/18 inches? 0  Gender: 1  Obstructive Sleep Apnea Score 4  Score 4 or greater  Results sent to PCP

## 2013-04-23 ENCOUNTER — Other Ambulatory Visit: Payer: Self-pay | Admitting: Family Medicine

## 2013-04-23 ENCOUNTER — Encounter: Payer: Self-pay | Admitting: *Deleted

## 2013-04-24 MED ORDER — DEXTROSE 5 % IV SOLN
1.5000 g | INTRAVENOUS | Status: AC
  Administered 2013-04-25: 1.5 g via INTRAVENOUS
  Filled 2013-04-24: qty 1.5

## 2013-04-25 ENCOUNTER — Inpatient Hospital Stay (HOSPITAL_COMMUNITY)
Admission: RE | Admit: 2013-04-25 | Discharge: 2013-04-26 | DRG: 254 | Disposition: A | Discharge status: Home / Self Care | Payer: Medicare Other | Source: Ambulatory Visit | Attending: Vascular Surgery | Admitting: Vascular Surgery

## 2013-04-25 ENCOUNTER — Encounter (HOSPITAL_COMMUNITY): Payer: Self-pay | Admitting: *Deleted

## 2013-04-25 ENCOUNTER — Ambulatory Visit: Payer: Medicare Other | Admitting: Cardiology

## 2013-04-25 ENCOUNTER — Encounter (HOSPITAL_COMMUNITY): Payer: Medicare Other | Admitting: Anesthesiology

## 2013-04-25 ENCOUNTER — Inpatient Hospital Stay (HOSPITAL_COMMUNITY): Payer: Medicare Other | Admitting: Anesthesiology

## 2013-04-25 ENCOUNTER — Encounter (HOSPITAL_COMMUNITY)
Admission: RE | Disposition: A | Discharge status: Home / Self Care | Payer: Self-pay | Source: Ambulatory Visit | Attending: Vascular Surgery

## 2013-04-25 DIAGNOSIS — I2510 Atherosclerotic heart disease of native coronary artery without angina pectoris: Secondary | ICD-10-CM | POA: Diagnosis present

## 2013-04-25 DIAGNOSIS — E119 Type 2 diabetes mellitus without complications: Secondary | ICD-10-CM | POA: Diagnosis present

## 2013-04-25 DIAGNOSIS — I70219 Atherosclerosis of native arteries of extremities with intermittent claudication, unspecified extremity: Secondary | ICD-10-CM

## 2013-04-25 DIAGNOSIS — Z7982 Long term (current) use of aspirin: Secondary | ICD-10-CM

## 2013-04-25 DIAGNOSIS — E039 Hypothyroidism, unspecified: Secondary | ICD-10-CM | POA: Diagnosis present

## 2013-04-25 DIAGNOSIS — Z951 Presence of aortocoronary bypass graft: Secondary | ICD-10-CM

## 2013-04-25 DIAGNOSIS — Z87891 Personal history of nicotine dependence: Secondary | ICD-10-CM

## 2013-04-25 DIAGNOSIS — E785 Hyperlipidemia, unspecified: Secondary | ICD-10-CM | POA: Diagnosis present

## 2013-04-25 DIAGNOSIS — G4730 Sleep apnea, unspecified: Secondary | ICD-10-CM | POA: Diagnosis present

## 2013-04-25 HISTORY — PX: FEMORAL-POPLITEAL BYPASS GRAFT: SHX937

## 2013-04-25 LAB — GLUCOSE, CAPILLARY
Glucose-Capillary: 116 mg/dL — ABNORMAL HIGH (ref 70–99)
Glucose-Capillary: 103 mg/dL — ABNORMAL HIGH (ref 70–99)
Glucose-Capillary: 94 mg/dL (ref 70–99)

## 2013-04-25 SURGERY — BYPASS GRAFT FEMORAL-POPLITEAL ARTERY
Anesthesia: General | Site: Leg Upper | Laterality: Right | Wound class: Clean

## 2013-04-25 MED ORDER — DEXTROSE 5 % IV SOLN
1.5000 g | Freq: Two times a day (BID) | INTRAVENOUS | Status: AC
  Administered 2013-04-25 – 2013-04-26 (×2): 1.5 g via INTRAVENOUS
  Filled 2013-04-25 (×3): qty 1.5

## 2013-04-25 MED ORDER — SODIUM CHLORIDE 0.9 % IR SOLN
Status: DC | PRN
  Administered 2013-04-25: 09:00:00

## 2013-04-25 MED ORDER — KETOROLAC TROMETHAMINE 30 MG/ML IJ SOLN: 15.0000 mg | Freq: Once | INTRAMUSCULAR | Status: AC | PRN

## 2013-04-25 MED ORDER — MIDAZOLAM HCL 5 MG/5ML IJ SOLN
INTRAMUSCULAR | Status: DC | PRN
  Administered 2013-04-25: 1 mg via INTRAVENOUS

## 2013-04-25 MED ORDER — ASPIRIN EC 81 MG PO TBEC
81.0000 mg | DELAYED_RELEASE_TABLET | Freq: Every day | ORAL | Status: DC
  Administered 2013-04-26: 81 mg via ORAL
  Filled 2013-04-25: qty 1

## 2013-04-25 MED ORDER — THROMBIN 20000 UNITS EX SOLR
CUTANEOUS | Status: DC | PRN
  Administered 2013-04-25: 13:00:00 via TOPICAL

## 2013-04-25 MED ORDER — ALUM & MAG HYDROXIDE-SIMETH 200-200-20 MG/5ML PO SUSP: 15.0000 mL | ORAL | Status: DC | PRN

## 2013-04-25 MED ORDER — LACTATED RINGERS IV SOLN
INTRAVENOUS | Status: DC | PRN
  Administered 2013-04-25 (×3): via INTRAVENOUS

## 2013-04-25 MED ORDER — DOCUSATE SODIUM 100 MG PO CAPS
100.0000 mg | ORAL_CAPSULE | Freq: Every day | ORAL | Status: DC
  Administered 2013-04-26: 100 mg via ORAL
  Filled 2013-04-25: qty 1

## 2013-04-25 MED ORDER — ENOXAPARIN SODIUM 40 MG/0.4ML ~~LOC~~ SOLN
40.0000 mg | SUBCUTANEOUS | Status: DC
  Administered 2013-04-26: 40 mg via SUBCUTANEOUS
  Filled 2013-04-25: qty 0.4

## 2013-04-25 MED ORDER — NEOSTIGMINE METHYLSULFATE 1 MG/ML IJ SOLN
INTRAMUSCULAR | Status: DC | PRN
  Administered 2013-04-25: 4 mg via INTRAVENOUS

## 2013-04-25 MED ORDER — THROMBIN 20000 UNITS EX SOLR
CUTANEOUS | Status: AC
  Filled 2013-04-25: qty 20000

## 2013-04-25 MED ORDER — PROPOFOL 10 MG/ML IV BOLUS
INTRAVENOUS | Status: DC | PRN
  Administered 2013-04-25 (×2): 20 mg via INTRAVENOUS
  Administered 2013-04-25: 130 mg via INTRAVENOUS

## 2013-04-25 MED ORDER — EPHEDRINE SULFATE 50 MG/ML IJ SOLN
INTRAMUSCULAR | Status: DC | PRN
  Administered 2013-04-25 (×2): 10 mg via INTRAVENOUS
  Administered 2013-04-25 (×2): 5 mg via INTRAVENOUS
  Administered 2013-04-25: 10 mg via INTRAVENOUS
  Administered 2013-04-25: 5 mg via INTRAVENOUS

## 2013-04-25 MED ORDER — ATENOLOL 25 MG PO TABS
25.0000 mg | ORAL_TABLET | Freq: Every day | ORAL | Status: DC
  Administered 2013-04-26: 25 mg via ORAL
  Filled 2013-04-25: qty 1

## 2013-04-25 MED ORDER — LEVOTHYROXINE SODIUM 50 MCG PO TABS
50.0000 ug | ORAL_TABLET | Freq: Every day | ORAL | Status: DC
  Administered 2013-04-26: 50 ug via ORAL
  Filled 2013-04-25 (×2): qty 1

## 2013-04-25 MED ORDER — MORPHINE SULFATE 2 MG/ML IJ SOLN: 2.0000 mg | INTRAMUSCULAR | Status: DC | PRN

## 2013-04-25 MED ORDER — HYDROMORPHONE HCL PF 1 MG/ML IJ SOLN: 0.2500 mg | INTRAMUSCULAR | Status: DC | PRN

## 2013-04-25 MED ORDER — GUAIFENESIN-DM 100-10 MG/5ML PO SYRP: 15.0000 mL | ORAL_SOLUTION | ORAL | Status: DC | PRN

## 2013-04-25 MED ORDER — DEXTROSE 5 % IV SOLN
INTRAVENOUS | Status: DC | PRN
  Administered 2013-04-25: 09:00:00 via INTRAVENOUS

## 2013-04-25 MED ORDER — OXYCODONE-ACETAMINOPHEN 5-325 MG PO TABS
1.0000 | ORAL_TABLET | ORAL | Status: DC | PRN
  Administered 2013-04-25: 2 via ORAL
  Filled 2013-04-25: qty 2

## 2013-04-25 MED ORDER — INSULIN ASPART 100 UNIT/ML ~~LOC~~ SOLN: 0.0000 [IU] | Freq: Three times a day (TID) | SUBCUTANEOUS | Status: DC

## 2013-04-25 MED ORDER — LABETALOL HCL 5 MG/ML IV SOLN: 10.0000 mg | INTRAVENOUS | Status: DC | PRN

## 2013-04-25 MED ORDER — DUTASTERIDE 0.5 MG PO CAPS
0.5000 mg | ORAL_CAPSULE | Freq: Every day | ORAL | Status: DC
  Administered 2013-04-26: 0.5 mg via ORAL
  Filled 2013-04-25: qty 1

## 2013-04-25 MED ORDER — SODIUM CHLORIDE 0.9 % IV SOLN
INTRAVENOUS | Status: DC
  Administered 2013-04-25: 16:00:00 via INTRAVENOUS

## 2013-04-25 MED ORDER — FENTANYL CITRATE 0.05 MG/ML IJ SOLN
INTRAMUSCULAR | Status: DC | PRN
  Administered 2013-04-25 (×5): 50 ug via INTRAVENOUS

## 2013-04-25 MED ORDER — SODIUM CHLORIDE 0.9 % IV SOLN: 500.0000 mL | Freq: Once | INTRAVENOUS | Status: AC | PRN

## 2013-04-25 MED ORDER — SODIUM CHLORIDE 0.9 % IV SOLN: INTRAVENOUS | Status: DC

## 2013-04-25 MED ORDER — PANTOPRAZOLE SODIUM 40 MG PO TBEC
40.0000 mg | DELAYED_RELEASE_TABLET | Freq: Every day | ORAL | Status: DC
  Administered 2013-04-25 – 2013-04-26 (×2): 40 mg via ORAL
  Filled 2013-04-25 (×2): qty 1

## 2013-04-25 MED ORDER — ONDANSETRON HCL 4 MG/2ML IJ SOLN: 4.0000 mg | Freq: Four times a day (QID) | INTRAMUSCULAR | Status: DC | PRN

## 2013-04-25 MED ORDER — METOPROLOL TARTRATE 1 MG/ML IV SOLN: 2.0000 mg | INTRAVENOUS | Status: DC | PRN

## 2013-04-25 MED ORDER — HEPARIN SODIUM (PORCINE) 1000 UNIT/ML IJ SOLN
INTRAMUSCULAR | Status: DC | PRN
  Administered 2013-04-25: 8000 [IU] via INTRAVENOUS

## 2013-04-25 MED ORDER — ATORVASTATIN CALCIUM 40 MG PO TABS
40.0000 mg | ORAL_TABLET | Freq: Every day | ORAL | Status: DC
  Filled 2013-04-25: qty 1

## 2013-04-25 MED ORDER — PHENOL 1.4 % MT LIQD: 1.0000 | OROMUCOSAL | Status: DC | PRN

## 2013-04-25 MED ORDER — POTASSIUM CHLORIDE CRYS ER 20 MEQ PO TBCR
20.0000 meq | EXTENDED_RELEASE_TABLET | Freq: Every day | ORAL | Status: DC | PRN
Start: 2013-04-25 — End: 2013-04-26

## 2013-04-25 MED ORDER — METFORMIN HCL ER 500 MG PO TB24
1000.0000 mg | ORAL_TABLET | Freq: Every day | ORAL | Status: DC
  Administered 2013-04-26: 1000 mg via ORAL
  Filled 2013-04-25: qty 2

## 2013-04-25 MED ORDER — BISACODYL 10 MG RE SUPP: 10.0000 mg | Freq: Every day | RECTAL | Status: DC | PRN

## 2013-04-25 MED ORDER — POLYETHYLENE GLYCOL 3350 17 G PO PACK
17.0000 g | PACK | Freq: Every day | ORAL | Status: DC | PRN
  Filled 2013-04-25: qty 1

## 2013-04-25 MED ORDER — HYDRALAZINE HCL 20 MG/ML IJ SOLN: 10.0000 mg | INTRAMUSCULAR | Status: DC | PRN

## 2013-04-25 MED ORDER — 0.9 % SODIUM CHLORIDE (POUR BTL) OPTIME
TOPICAL | Status: DC | PRN
  Administered 2013-04-25: 2000 mL

## 2013-04-25 MED ORDER — LIDOCAINE HCL (CARDIAC) 20 MG/ML IV SOLN
INTRAVENOUS | Status: DC | PRN
  Administered 2013-04-25: 30 mg via INTRAVENOUS

## 2013-04-25 MED ORDER — GLYCOPYRROLATE 0.2 MG/ML IJ SOLN
INTRAMUSCULAR | Status: DC | PRN
  Administered 2013-04-25: .15 mg via INTRAVENOUS
  Administered 2013-04-25: 0.6 mg via INTRAVENOUS

## 2013-04-25 MED ORDER — PAPAVERINE HCL 30 MG/ML IJ SOLN
INTRAMUSCULAR | Status: AC
  Filled 2013-04-25: qty 2

## 2013-04-25 MED ORDER — ACETAMINOPHEN 650 MG RE SUPP: 325.0000 mg | RECTAL | Status: DC | PRN

## 2013-04-25 MED ORDER — PROTAMINE SULFATE 10 MG/ML IV SOLN
INTRAVENOUS | Status: DC | PRN
  Administered 2013-04-25 (×3): 10 mg via INTRAVENOUS

## 2013-04-25 MED ORDER — ONDANSETRON HCL 4 MG/2ML IJ SOLN: 4.0000 mg | Freq: Once | INTRAMUSCULAR | Status: AC | PRN

## 2013-04-25 MED ORDER — ACETAMINOPHEN 325 MG PO TABS: 325.0000 mg | ORAL_TABLET | ORAL | Status: DC | PRN

## 2013-04-25 SURGICAL SUPPLY — 70 items
BANDAGE ELASTIC 4 VELCRO ST LF (GAUZE/BANDAGES/DRESSINGS) IMPLANT
BANDAGE ESMARK 6X9 LF (GAUZE/BANDAGES/DRESSINGS) IMPLANT
BLADE SURG ROTATE 9660 (MISCELLANEOUS) ×2 IMPLANT
BNDG ESMARK 6X9 LF (GAUZE/BANDAGES/DRESSINGS)
CANISTER SUCTION 2500CC (MISCELLANEOUS) ×2 IMPLANT
CLIP TI MEDIUM 24 (CLIP) ×2 IMPLANT
CLIP TI WIDE RED SMALL 24 (CLIP) ×2 IMPLANT
COVER PROBE W GEL 5X96 (DRAPES) ×2 IMPLANT
COVER SURGICAL LIGHT HANDLE (MISCELLANEOUS) ×2 IMPLANT
CUFF TOURNIQUET SINGLE 24IN (TOURNIQUET CUFF) IMPLANT
CUFF TOURNIQUET SINGLE 34IN LL (TOURNIQUET CUFF) IMPLANT
CUFF TOURNIQUET SINGLE 44IN (TOURNIQUET CUFF) IMPLANT
DERMABOND ADVANCED (GAUZE/BANDAGES/DRESSINGS) ×2
DERMABOND ADVANCED .7 DNX12 (GAUZE/BANDAGES/DRESSINGS) ×2 IMPLANT
DRAIN CHANNEL 15F RND FF W/TCR (WOUND CARE) IMPLANT
DRAPE C-ARM 42X72 X-RAY (DRAPES) IMPLANT
DRAPE WARM FLUID 44X44 (DRAPE) ×2 IMPLANT
DRSG COVADERM 4X10 (GAUZE/BANDAGES/DRESSINGS) ×2 IMPLANT
DRSG COVADERM 4X8 (GAUZE/BANDAGES/DRESSINGS) ×2 IMPLANT
ELECT REM PT RETURN 9FT ADLT (ELECTROSURGICAL) ×2
ELECTRODE REM PT RTRN 9FT ADLT (ELECTROSURGICAL) ×1 IMPLANT
EVACUATOR SILICONE 100CC (DRAIN) IMPLANT
GLOVE BIO SURGEON STRL SZ 6.5 (GLOVE) ×8 IMPLANT
GLOVE BIO SURGEON STRL SZ7 (GLOVE) ×4 IMPLANT
GLOVE BIOGEL PI IND STRL 6.5 (GLOVE) ×3 IMPLANT
GLOVE BIOGEL PI IND STRL 7.5 (GLOVE) ×3 IMPLANT
GLOVE BIOGEL PI INDICATOR 6.5 (GLOVE) ×3
GLOVE BIOGEL PI INDICATOR 7.5 (GLOVE) ×3
GLOVE ECLIPSE 6.0 STRL STRAW (GLOVE) ×2 IMPLANT
GLOVE ECLIPSE 7.5 STRL STRAW (GLOVE) ×4 IMPLANT
GLOVE SS BIOGEL STRL SZ 7.5 (GLOVE) ×1 IMPLANT
GLOVE SUPERSENSE BIOGEL SZ 7.5 (GLOVE) ×1
GLOVE SURG SS PI 7.0 STRL IVOR (GLOVE) ×2 IMPLANT
GOWN STRL NON-REIN LRG LVL3 (GOWN DISPOSABLE) ×10 IMPLANT
GOWN STRL REIN XL XLG (GOWN DISPOSABLE) ×4 IMPLANT
INSERT FOGARTY SM (MISCELLANEOUS) IMPLANT
KIT BASIN OR (CUSTOM PROCEDURE TRAY) ×2 IMPLANT
KIT ROOM TURNOVER OR (KITS) ×2 IMPLANT
LOOP VESSEL MAXI BLUE (MISCELLANEOUS) ×2 IMPLANT
MARKER GRAFT CORONARY BYPASS (MISCELLANEOUS) IMPLANT
NS IRRIG 1000ML POUR BTL (IV SOLUTION) ×4 IMPLANT
PACK PERIPHERAL VASCULAR (CUSTOM PROCEDURE TRAY) ×2 IMPLANT
PAD ARMBOARD 7.5X6 YLW CONV (MISCELLANEOUS) ×4 IMPLANT
PADDING CAST COTTON 6X4 STRL (CAST SUPPLIES) IMPLANT
SET MICROPUNCTURE 5F STIFF (MISCELLANEOUS) IMPLANT
SPONGE SURGIFOAM ABS GEL 100 (HEMOSTASIS) ×2 IMPLANT
STAPLER VISISTAT 35W (STAPLE) ×4 IMPLANT
STOPCOCK 4 WAY LG BORE MALE ST (IV SETS) IMPLANT
SUT ETHILON 3 0 PS 1 (SUTURE) IMPLANT
SUT GORETEX 5 0 TT13 24 (SUTURE) IMPLANT
SUT GORETEX 6.0 TT13 (SUTURE) IMPLANT
SUT MNCRL AB 4-0 PS2 18 (SUTURE) ×4 IMPLANT
SUT PROLENE 5 0 C 1 24 (SUTURE) ×8 IMPLANT
SUT PROLENE 6 0 BV (SUTURE) ×4 IMPLANT
SUT PROLENE 7 0 BV 1 (SUTURE) IMPLANT
SUT SILK 2 0 (SUTURE) ×1
SUT SILK 2 0 FS (SUTURE) ×2 IMPLANT
SUT SILK 2-0 18XBRD TIE 12 (SUTURE) ×1 IMPLANT
SUT SILK 3 0 (SUTURE)
SUT SILK 3-0 18XBRD TIE 12 (SUTURE) IMPLANT
SUT VIC AB 2-0 CT1 27 (SUTURE) ×1
SUT VIC AB 2-0 CT1 TAPERPNT 27 (SUTURE) ×1 IMPLANT
SUT VIC AB 3-0 SH 27 (SUTURE) ×4
SUT VIC AB 3-0 SH 27X BRD (SUTURE) ×4 IMPLANT
TOWEL OR 17X24 6PK STRL BLUE (TOWEL DISPOSABLE) ×4 IMPLANT
TOWEL OR 17X26 10 PK STRL BLUE (TOWEL DISPOSABLE) ×6 IMPLANT
TRAY FOLEY CATH 16FRSI W/METER (SET/KITS/TRAYS/PACK) ×2 IMPLANT
TUBING EXTENTION W/L.L. (IV SETS) IMPLANT
UNDERPAD 30X30 INCONTINENT (UNDERPADS AND DIAPERS) ×2 IMPLANT
WATER STERILE IRR 1000ML POUR (IV SOLUTION) ×2 IMPLANT

## 2013-04-25 NOTE — Op Note (Signed)
OPERATIVE NOTE   PROCEDURE: 1. Right common femoral artery to above-the-knee popliteal artery bypass with non-reversed ipsilateral greater saphenous vein   PRE-OPERATIVE DIAGNOSIS: lifestyle limiting intermittent claudication, right superficial femoral artery occlusion  POST-OPERATIVE DIAGNOSIS: same as above   SURGEON: Leonides Sake, MD  ASSISTANT(S): Doreatha Massed, PAC   ANESTHESIA: general  ESTIMATED BLOOD LOSS: 100 cc  FINDING(S): 1.  Calcified common femoral artery with <30% luminal compromise 2.  Calcified above-the-knee popliteal artery with >4 mm lumen 3.  Easily dopplerable biphasic posterior tibial artery at end of case  SPECIMEN(S):  none  INDICATIONS:   Harry Brown is a 72 y.o. male who presents with lifestyle limiting intermittent claudication.  On diagnostic angiography, his right superficial femoral artery was occluded. The risk, benefits, and alternative for bypass operations were discussed with the patient.  The patient is aware the risks include but are not limited to: bleeding, infection, myocardial infarction, stroke, limb loss, nerve damage, need for additional procedures in the future, wound complications, and inability to complete the bypass.  The patient is aware of these risks and agreed to proceed.  DESCRIPTION: After full informed written consent was obtained, the patient was brought back to the operating room and placed supine upon the operating table.  Prior to induction, the patient was given intravenous antibiotics.  After obtaining adequate anesthesia, the patient was prepped and draped in the standard fashion for a femoral to popliteal bypass operation.  Attention was turned to the right groin.  A longitudinal incision was made over the right common femoral artery.  Using blunt dissection and electrocautery, the artery was dissected out from the inguinal ligament down to the femoral bifurcation.  The superficial femoral artery, profunda femoral artery,  and external iliac artery were dissected out and vessel loops applied.  Circumflex branches were also dissected and controlled with silk ties.  This common femoral artery was found on exam to be calcified but there was a soft spot anteriorly.    At this point, attention was turned to the distal thigh.  An longitudinal incision was made over Hunter's canal.  Using blunt dissection and electrocautery, a plane was developed through the subcutaneous tissue and fascia down to Hunter's canal.  The above-the-knee popliteal popliteal artery was dissected away from its adjacent veins.  This popliteal artery was found on exam to have scattered calcifications.    At this point, the patient's right greater saphenous vein was identified under Sonosite guidance.  Skip incisions was made over the greater saphenous vein from the saphenofemoral junction down to knee.  The vein conduit was found to be adequate with size: 4-5 mm.  Side branches of greater saphenous vein were tied off with 4-0 silk or clipped with small titanium clips.  The saphenofemoral junction was clamped and the the greater saphenous vein was transected.  The saphenofemoral junction was oversewn in a double layer with a running stitch of 5-0 prolene.  The distal extent of this conduit was tied off at the level of the knee and the conduit transected proximally.  The harvest vein conduit was soaked in a heparinized saline solution.  I placed the vessel cannula in the distal aspect of the conduit and distended the conduit, and side-branches were tied off with 4-0 silk.    At this point, the patient was given 8000 units of Heparin intravenously, which was a therapeutic bolus.  After waiting three minutes, I clamped the external iliac artery and placed the profunda and superficial femoral arteries under  tension.  I then made an incision in the common femoral artery.  This was extended proximally and distally with a Potts scissor.  I spatulated the proximal end of  the conduit with a Potts scissor.  The vein was sewn to the common femoral artery arteriotomy in an end-to-side fashion with a running stitch of 5-0 Prolene, taking care to tack the plaque up against the anastomosis.  I clamped the distal conduit and released all clamps and vessel loops.  This distended the proximal segment of conduit until it reacted the first valve.  I then lysed the valves with a valvulotomy on two passes.  There was pulsatile bleeding from the end of the conduit.  I clamped the distal vein and marked the anterior orientation.    At this point, I tunneled from the above-the-knee popliteal exposure to the right groin with a Gore tunneler.  The vein conduit was sewn to the inner cannula and passed through the tunnel, taking care to maintain the orientation of the conduit.  The proximal end of the conduit was clamped.  I passed the conduit through the tunnel, taking care to maintain orientation.  I removed the metal tunnel and then sharply release the conduit.  The above-the-knee popliteal artery was then placed under tension proximally and distally with vessel loops.  An arteriotomy was made with a 11-blade and were extended proximally and distally with a Pott's scissor.  I passed a 4 mm dilator distally without any resistance.  The dilator only passed a few centimeters proximally before hitting hard resistance.  The distal end of the conduit was spatulated, shortening the conduit in the process.  The conduit was sewn to the to the artery in an end-to-side configuration with a running stitch of 5-0 Prolene.  Prior to completing the anastomosis, I backbled both end of the artery.  There was: no clots evident with excellent backbleeding distally with some antegrade backbleeding proximally.  The conduit was allowed to bleed in an antegrade fashion.  There was: no clots evident and excellent pulsatile bleeding.  I completed the anastomosis in the usual fashion.  I placed thombin and gelfoam in the  wound.  After releasing all vessel loops and clamps, there was a palpable pulse in the above-the-knee popliteal artery.  Distally, there was faintly palpable posterior tibial artery with a biphasic signal.  At this point, all wounds were washed out.  Bleeding points were controlled with electrocautery, and suture repair of active bleeding points in the suture line.  No further bleeding was present.  The above-the-knee exposure was repaired with a layer of 2-0 Vicryl, a layer of 3-0 Vicryl, and a running subcuticular of 4-0 Monocryl in the skin layer.  In a similar fashion, the groin was repaired with a double layer of 2-0 Vicryl, a double layer of 3-0 Vicryl, and a running subcuticular of 4-0 Monocryl in the skin layer.  All skin incisions were cleaned, dried, and reinforced with Dermabond.    Each vein harvest site was repaired with a running stitch of 3-0 Vicryl in the subcutaneous tissue and the skin was reapproximated with staples.  A sterile dressing was applied to each incision.  COMPLICATIONS: none  CONDITION: stable  Leonides Sake, MD [facility] [place] Office: [phone] Pager: [phone]  04/25/2013, 1:13 PM

## 2013-04-25 NOTE — H&P (View-Only) (Signed)
[facility] [place]  Established Intermittent Claudication  History of Present Illness  Harry Brown is a 72 y.o. (01/07/1941) male who presents with chief complaint: continue R>L short distance claudication.  The patient's symptoms have not progressed.  The patient's symptoms are: lifestyle limiting short distance claudication.  The patient's treatment regimen currently included: maximal medical management and walking plan.  On previous angiogram, the patient has bilateral SFA occlusion: R not amendable to endovascular intervention, L possibly amendable. The patient was already evaluated by his cardiologist: Dr. Crenshaw and cleared for bypass surgery.    Past Medical History  Diagnosis Date  . Diabetes mellitus   . Diverticulitis   . History of sleep apnea   . CAD (coronary artery disease)   . Hypothyroidism   . ED (erectile dysfunction)   . Other and unspecified hyperlipidemia   . Neck pain on left side 11/22/2012  . Urinary urgency 11/22/2012    Past Surgical History  Procedure Laterality Date  . Coronary artery bypass graft  2001  . Tonsillectomy  2003  . Palate / uvula biopsy / excision  2003    Removed due to sleep apnea  . Cervical disc surgery    . Abdominal aortagram  Oct. 9, 2014    Dr. Jane Birkel    History   Social History  . Marital Status: Married    Spouse Name: N/A    Number of Children: 8   . Years of Education: N/A   Occupational History  .     Social History Main Topics  . Smoking status: Former Smoker  . Smokeless tobacco: Never Used     Comment: Uses Comit lozenges  . Alcohol Use: No  . Drug Use: Not on file  . Sexual Activity: Not on file   Other Topics Concern  . Not on file   Social History Narrative  . No narrative on file    Family History  Problem Relation Age of Onset  . Hyperlipidemia Mother   . Heart disease Mother   . Diabetes Mother   . Hypertension Mother   . Heart disease Father   . Cancer Neg Hx      Current Outpatient Prescriptions on File Prior to Visit  Medication Sig Dispense Refill  . aspirin EC 81 MG tablet Take 81 mg by mouth daily.      . atenolol (TENORMIN) 25 MG tablet Take 1 tablet (25 mg total) by mouth daily.  30 tablet  5  . atorvastatin (LIPITOR) 40 MG tablet Take 1 tablet (40 mg total) by mouth daily.  30 tablet  3  . dutasteride (AVODART) 0.5 MG capsule Take 1 capsule (0.5 mg total) by mouth daily.  30 capsule  3  . FREESTYLE LITE test strip Use to test blood sugar 1 to 2 times daily      . Lancets (FREESTYLE) lancets Use to check blood sugar 1-2 times daily      . levothyroxine (SYNTHROID, LEVOTHROID) 50 MCG tablet TAKE ONE TABLET BY MOUTH ONCE DAILY.  30 tablet  2  . metFORMIN (GLUCOPHAGE-XR) 500 MG 24 hr tablet Take 2 tablets (1,000 mg total) by mouth daily with breakfast.  60 tablet  3  . Multiple Vitamin (MULTIVITAMIN WITH MINERALS) TABS tablet Take 1 tablet by mouth daily.       No current facility-administered medications on file prior to visit.    No Known Allergies  REVIEW OF SYSTEMS: (Positives checked otherwise negative)  CARDIOVASCULAR: [ ]   chest pain, [ ] chest pressure, [ ] palpitations, [ ] shortness of breath when laying flat, [ ] shortness of breath with exertion, [x ] pain in feet when walking, [ x] pain in feet when laying flat, [ ] history of blood clot in veins (DVT), [ ] history of phlebitis, [ ] swelling in legs, [ ] varicose veins  PULMONARY: [ ] productive cough, [ ] asthma, [ ] wheezing  NEUROLOGIC: [ ] weakness in arms or legs, [ ] numbness in arms or legs, [ ] difficulty speaking or slurred speech, [ ] temporary loss of vision in one eye, [ ] dizziness  HEMATOLOGIC: [ ] bleeding problems, [ ] problems with blood clotting too easily  MUSCULOSKEL: [ ] joint pain, [ ] joint swelling  GASTROINTEST: [ ] Vomiting blood, [ ] Blood in stool  GENITOURINARY: [ ] Burning with urination, [ ] Blood in urine  PSYCHIATRIC: [ ] history of major  depression  INTEGUMENTARY: [ ] rashes, [ ] ulcers  CONSTITUTIONAL: [ ] fever, [ ] chills   For VQI Use Only  PRE-ADM LIVING: Home  AMB STATUS: Ambulatory  CAD Sx: None  PRIOR CHF: None  STRESS TEST: [x] No, [ ] Normal, [ ] + ischemia, [ ] + MI, [ ] Both  Physical Examination  Filed Vitals:   04/13/13 1035  BP: 114/80  Pulse: 69  Resp: 16  Height: 6' (1.829 m)  Weight: 224 lb (101.606 kg)  SpO2: 96%   Body mass index is 30.37 kg/(m^2).  General: A&O x 3, WDWN   Pulmonary: Sym exp, good air movt, CTAB, no rales, rhonchi, & wheezing   Cardiac: RRR, Nl S1, S2, no Murmurs, rubs or gallops   Vascular:  Vessel  Right  Left   Radial  Palpable  Palpable   Brachial  Faintly Palpable  Faintly Palpable   Carotid  Palpable, without bruit  Palpable, without bruit   Aorta  Not palpable  N/A   Femoral  Palpable  Palpable   Popliteal  Not palpable  Not palpable   PT  Not Palpable  Not Palpable   DP  Not Palpable  Not Palpable    Gastrointestinal: soft, NTND, -G/R, - HSM, - masses, - CVAT B   Musculoskeletal: M/S 5/5 throughout , Extremities without ischemic changes except cyanotic toes R foot, some spider veins in both legs   Neurologic: CN 2-12 intact , Pain and light touch intact in extremities , Motor exam as listed above   B GSV mapping (04/13/2013)  Likely acceptable R GSV conduit: branching in mid-thigh with 4.2-4.7 mm branchs   Acceptable L conduit to calf  Medical Decision Making  [name] is a 72 y.o. male who presents with:  right > left leg lifestyle limiting intermittent claudication without evidence of critical limb ischemia.  The patient feels that his claudication is interfering with his ability to work, as he has drive long distances and walk at the end of the drives.  Based on the patient's vascular studies and examination, I have offered the patient: R fem-AK pop bypass preferable with R GSV, though L GSV harvest may need to be completed.  This  procedure is scheduled on 19 NOV 14.  I discussed in depth with the patient the nature of atherosclerosis, and emphasized the importance of maximal medical management including strict control of blood pressure, blood glucose, and lipid levels, antiplatelet agents, obtaining regular exercise, and cessation of smoking.    The patient   is aware that without maximal medical management the underlying atherosclerotic disease process will progress, limiting the benefit of any interventions. The patient is currently on a statin: Lipitor The patient is currently on an anti-platelet: ASA  Thank you for allowing us to participate in this patient's care.  Kierria Feigenbaum, MD [facility] [place] Office: [phone] Pager: [phone]  04/13/2013, 4:28 PM      

## 2013-04-25 NOTE — Anesthesia Preprocedure Evaluation (Addendum)
Anesthesia Evaluation  Patient identified by MRN, date of birth, ID band Patient awake    Reviewed: Allergy & Precautions, H&P , NPO status , Patient's Chart, lab work & pertinent test results, reviewed documented beta blocker date and time   Airway Mallampati: II TM Distance: >3 FB Neck ROM: Full    Dental  (+) Edentulous Upper, Edentulous Lower and Dental Advisory Given   Pulmonary former smoker,  breath sounds clear to auscultation        Cardiovascular hypertension, Pt. on home beta blockers Rhythm:Regular Rate:Normal  CABG 1991; cleared this month for surgery by Dr. Jens Som   Neuro/Psych    GI/Hepatic   Endo/Other  On oral agent for diabetes  Renal/GU      Musculoskeletal   Abdominal   Peds  Hematology   Anesthesia Other Findings Full upper and lower dentures out to labelled cup to wife for safekeeping  Asessment done in holding area at 0800  Reproductive/Obstetrics                         Anesthesia Physical Anesthesia Plan  ASA: III  Anesthesia Plan: General   Post-op Pain Management:    Induction: Intravenous  Airway Management Planned: Oral ETT  Additional Equipment: Arterial line  Intra-op Plan:   Post-operative Plan: Extubation in OR  Informed Consent: I have reviewed the patients History and Physical, chart, labs and discussed the procedure including the risks, benefits and alternatives for the proposed anesthesia with the patient or authorized representative who has indicated his/her understanding and acceptance.   Dental advisory given  Plan Discussed with: CRNA and Anesthesiologist  Anesthesia Plan Comments:        Anesthesia Quick Evaluation

## 2013-04-25 NOTE — Progress Notes (Signed)
Utilization review completed.  

## 2013-04-25 NOTE — Anesthesia Postprocedure Evaluation (Signed)
  Anesthesia Post-op Note  Patient: Harry Brown  Procedure(s) Performed: Procedure(s): RIGHT FEMORAL TO ABOVE KNEE POPLITEAL BYPASS GRAFT WITH RIGHT GREATER SAPHENOUS VEIN HARVEST; ULTRASOUND GUIDED (Right)  Patient Location: PACU  Anesthesia Type:General  Level of Consciousness: awake, alert  and oriented  Airway and Oxygen Therapy: Patient Spontanous Breathing and Patient connected to nasal cannula oxygen  Post-op Pain: mild  Post-op Assessment: Post-op Vital signs reviewed, Patient's Cardiovascular Status Stable, Respiratory Function Stable, Patent Airway and Pain level controlled  Post-op Vital Signs: stable  Complications: No apparent anesthesia complications

## 2013-04-25 NOTE — Anesthesia Procedure Notes (Addendum)
Procedure Name: Intubation Date/Time: 04/25/2013 8:39 AM Performed by: Marni Griffon Pre-anesthesia Checklist: Patient identified, Emergency Drugs available, Suction available and Patient being monitored Patient Re-evaluated:Patient Re-evaluated prior to inductionOxygen Delivery Method: Circle system utilized Preoxygenation: Pre-oxygenation with 100% oxygen Ventilation: Mask ventilation without difficulty Laryngoscope Size: Mac and 4 Grade View: Grade II Tube type: Oral Tube size: 7.5 mm Number of attempts: 1 Airway Equipment and Method: Stylet Placement Confirmation: ETT inserted through vocal cords under direct vision,  positive ETCO2 and breath sounds checked- equal and bilateral Secured at: 22 (cm at gum) cm Tube secured with: Tape Dental Injury: Teeth and Oropharynx as per pre-operative assessment

## 2013-04-25 NOTE — Transfer of Care (Signed)
Immediate Anesthesia Transfer of Care Note  Patient: Harry Brown  Procedure(s) Performed: Procedure(s): RIGHT FEMORAL TO ABOVE KNEE POPLITEAL BYPASS GRAFT WITH RIGHT GREATER SAPHENOUS VEIN HARVEST; ULTRASOUND GUIDED (Right)  Patient Location: PACU  Anesthesia Type:General  Level of Consciousness: awake, alert  and patient cooperative  Airway & Oxygen Therapy: Patient Spontanous Breathing and Patient connected to face mask oxygen  Post-op Assessment: Report given to PACU RN and Post -op Vital signs reviewed and stable  Post vital signs: Reviewed and stable  Complications: No apparent anesthesia complications

## 2013-04-25 NOTE — Preoperative (Signed)
Beta Blockers   Reason not to administer Beta Blockers:Atenolol this morning

## 2013-04-25 NOTE — Interval H&P Note (Signed)
History and Physical Interval Note:  04/25/2013 7:46 AM  Harry Brown  has presented today for surgery, with the diagnosis of Peripheral Vascular Disease  The various methods of treatment have been discussed with the patient and family. After consideration of risks, benefits and other options for treatment, the patient has consented to  Procedure(s): RIGHT FEMORAL- ABOVE KNEE POPLITEAL BYPASS GRAFT / POSSIBLE RIGHT AND LEFT GREATER SAPHENOUS VEIN HARVEST (Right) as a surgical intervention .  The patient's history has been reviewed, patient examined, no change in status, stable for surgery.  I have reviewed the patient's chart and labs.  Questions were answered to the patient's satisfaction.     Kiyonna Tortorelli LIANG-YU

## 2013-04-26 DIAGNOSIS — Z48812 Encounter for surgical aftercare following surgery on the circulatory system: Secondary | ICD-10-CM

## 2013-04-26 LAB — BASIC METABOLIC PANEL
BUN: 19 mg/dL (ref 6–23)
CO2: 24 mEq/L (ref 19–32)
Calcium: 8.1 mg/dL — ABNORMAL LOW (ref 8.4–10.5)
Chloride: 106 mEq/L (ref 96–112)
Creatinine, Ser: 0.81 mg/dL (ref 0.50–1.35)
GFR calc Af Amer: >90 mL/min (ref >90)
GFR calc non Af Amer: 86 mL/min — ABNORMAL LOW (ref >90)
Glucose, Bld: 126 mg/dL — ABNORMAL HIGH (ref 70–99)
Potassium: 3.9 mEq/L (ref 3.5–5.1)
Sodium: 139 mEq/L (ref 135–145)

## 2013-04-26 LAB — HEMOGLOBIN A1C
Hgb A1c MFr Bld: 6.7 % — ABNORMAL HIGH (ref <5.7)
Mean Plasma Glucose: 146 mg/dL — ABNORMAL HIGH (ref <117)

## 2013-04-26 LAB — GLUCOSE, CAPILLARY
Glucose-Capillary: 112 mg/dL — ABNORMAL HIGH (ref 70–99)
Glucose-Capillary: 98 mg/dL (ref 70–99)
Glucose-Capillary: 123 mg/dL — ABNORMAL HIGH (ref 70–99)
Glucose-Capillary: 136 mg/dL — ABNORMAL HIGH (ref 70–99)

## 2013-04-26 LAB — CBC
HCT: 32.5 % — ABNORMAL LOW (ref 39.0–52.0)
Hemoglobin: 11 g/dL — ABNORMAL LOW (ref 13.0–17.0)
MCH: 30.5 pg (ref 26.0–34.0)
MCHC: 33.8 g/dL (ref 30.0–36.0)
MCV: 90 fL (ref 78.0–100.0)
Platelets: 203 10*3/uL (ref 150–400)
RBC: 3.61 MIL/uL — ABNORMAL LOW (ref 4.22–5.81)
RDW: 13.4 % (ref 11.5–15.5)
WBC: 9.5 10*3/uL (ref 4.0–10.5)

## 2013-04-26 MED ORDER — OXYCODONE HCL 5 MG PO TABS: 5.0000 mg | ORAL_TABLET | Freq: Four times a day (QID) | ORAL | Status: DC | PRN

## 2013-04-26 NOTE — Progress Notes (Signed)
Physical Therapy Treatment Patient Details Name: Harry Brown MRN: [id number] DOB: 1940-11-11 Today's Date: 04/26/2013 Time: 4098-1191 PT Time Calculation (min): 12 min  PT Assessment / Plan / Recommendation  History of Present Illness s/p R bypass graft femoral-popliteal artery on 11/19   PT Comments   Pt had to be transported to vascular study before HEP was given and education given to pt.  We returned to complete this. Pt was willing and compliant with plan to perform HEP in hospital and once d/c to home  Follow Up Recommendations  No PT follow up;Supervision - Intermittent           Equipment Recommendations  None recommended by PT             Plan Current plan remains appropriate    Precautions / Restrictions Precautions Precautions: Fall Restrictions Weight Bearing Restrictions: No   Pertinent Vitals/Pain None reported       Exercises General Exercises - Lower Extremity Ankle Circles/Pumps: 10 reps;AROM Hip ABduction/ADduction: 10 reps;AROM;Both;Supine Straight Leg Raises: 10 reps;Both;AROM;Supine Hip Flexion/Marching: 10 reps;Both;AROM;Standing Mini-Sqauts: 10 reps;AROM;Standing     PT Goals (current goals can now be found in the care plan section) Acute Rehab PT Goals Patient Stated Goal: return home PT Goal Formulation: With patient Time For Goal Achievement: 05/10/13 Potential to Achieve Goals: Good  Visit Information  Last PT Received On: 04/26/13 History of Present Illness: s/p R bypass graft femoral-popliteal artery on 11/19    Subjective Data  Patient Stated Goal: return home   Cognition  Cognition Arousal/Alertness: Awake/alert Behavior During Therapy: WFL for tasks assessed/performed Overall Cognitive Status: Within Functional Limits for tasks assessed    Balance     End of Session PT - End of Session Activity Tolerance: Patient tolerated treatment well Patient left: in bed;with call bell/phone within reach;with family/visitor  present   GP    Barrie Dunker, SPT Pager:  [phone]  Barrie Dunker 04/26/2013, 2:09 PM

## 2013-04-26 NOTE — Discharge Summary (Signed)
[facility] Discharge Summary  Harry Brown 1940-12-11 72 y.o. male  [id number]  Admission Date: 04/25/2013  Discharge Date: 04/26/13  Physician: Fransisco Hertz, MD  Admission Diagnosis: Peripheral Vascular Disease   HPI:   This is a 72 y.o. male who presents with chief complaint: continue R>L short distance claudication. The patient's symptoms have not progressed. The patient's symptoms are: lifestyle limiting short distance claudication. The patient's treatment regimen currently included: maximal medical management and walking plan. On previous angiogram, the patient has bilateral SFA occlusion: R not amendable to endovascular intervention, L possibly amendable. The patient was already evaluated by his cardiologist: Dr. Jens Som and cleared for bypass surgery.   Hospital Course:  The patient was admitted to the hospital and taken to the operating room on 04/25/2013 and underwent: Right common femoral artery to above-the-knee popliteal artery bypass with non-reversed ipsilateral greater saphenous vein     The pt tolerated the procedure well and was transported to the PACU in good condition. By POD 1, he had ambulated.  His ABI's are as follows:   RIGHT    LEFT     PRESSURE  WAVEFORM   PRESSURE  WAVEFORM   BRACHIAL  108  Biphasic  BRACHIAL  125  Biphasic   DP  101  Biphasic  DP  89  Biphasic   PT  105  Triphasic  PT  82  Monophasic     RIGHT  LEFT   ABI  0.84  0.71   ABIs indicate an increase in arterial flow on the operative leg - right with a decrease post operative on the left.  Pt was able to void after foley removed.  The remainder of the hospital course consisted of increasing mobilization and increasing intake of solids without difficulty.  CBC    Component Value Date/Time   WBC 9.5 04/26/2013 0530   RBC 3.61* 04/26/2013 0530   HGB 11.0* 04/26/2013 0530   HCT 32.5* 04/26/2013 0530   PLT 203 04/26/2013 0530   MCV 90.0 04/26/2013 0530   MCH  30.5 04/26/2013 0530   MCHC 33.8 04/26/2013 0530   RDW 13.4 04/26/2013 0530   LYMPHSABS 3.0 12/16/2011 1525   MONOABS 1.0 12/16/2011 1525   EOSABS 0.4 12/16/2011 1525   BASOSABS 0.0 12/16/2011 1525    BMET    Component Value Date/Time   NA 139 04/26/2013 0530   K 3.9 04/26/2013 0530   CL 106 04/26/2013 0530   CO2 24 04/26/2013 0530   GLUCOSE 126* 04/26/2013 0530   BUN 19 04/26/2013 0530   CREATININE 0.81 04/26/2013 0530   CREATININE 0.97 03/20/2013 1639   CALCIUM 8.1* 04/26/2013 0530   GFRNONAA 86* 04/26/2013 0530   GFRAA >90 04/26/2013 0530     Discharge Instructions:   The patient is discharged to home with extensive instructions on wound care and progressive ambulation.  They are instructed not to drive or perform any heavy lifting until returning to see the physician in his office.  Discharge Orders   Future Appointments Provider Department Dept Phone   05/28/2013 1:30 PM Myeong Eyvonne Left [facility] [phone]   07/30/2013 1:00 PM Bradd Canary, MD [facility] at  [facility] [phone]   Future Orders Complete By Expires   Call MD for:  redness, tenderness, or signs of infection (pain, swelling, bleeding, redness, odor or green/yellow discharge around incision site)  As directed    Call MD for:  severe or increased pain, loss or decreased  feeling  in affected limb(s)  As directed    Call MD for:  temperature >100.5  As directed    Discharge wound care:  As directed    Comments:     Wash the groin wound with soap and water twice daily and pat dry. (No tub bath-only shower)  Then put a dry gauze or washcloth there to keep this area dry daily and as needed.  Do not use Vaseline or neosporin on your incisions.  Only use soap and water on your incisions and then protect and keep dry.   Driving Restrictions  As directed    Comments:     No driving for 2 weeks and while taking pain medication.   Lifting restrictions  As directed    Comments:     No lifting  for 4 weeks   Nursing communication  As directed    Scheduling Instructions:     Please give paper Rx to patient at discharge.  It is located on the paper chart.   Resume previous diet  As directed       Discharge Diagnosis:  Peripheral Vascular Disease  Secondary Diagnosis: Patient Active Problem List   Diagnosis Date Noted  . Pre-operative cardiovascular examination 04/13/2013  . Preop cardiovascular exam 04/03/2013  . Abdominal aortic aneurysm 04/03/2013  . Pain, foot 02/26/2013  . Peripheral vascular disease, unspecified 02/23/2013  . Atherosclerosis of native arteries of the extremities with intermittent claudication 02/23/2013  . Onychomycosis 11/27/2012  . PVD (peripheral vascular disease) 11/27/2012  . Neck pain on left side 11/22/2012  . Urinary urgency 11/22/2012  . Paresthesia 03/31/2012  . Bruit 12/29/2011  . CAD (coronary artery disease) 12/16/2011  . Rash 12/16/2011  . Hypothyroidism 12/16/2011  . DM (diabetes mellitus) 12/16/2011  . Other and unspecified hyperlipidemia 12/16/2011  . ED (erectile dysfunction) 12/16/2011  . BPH (benign prostatic hyperplasia) 12/16/2011  . Diverticulosis 12/16/2011   Past Medical History  Diagnosis Date  . Diverticulitis   . History of sleep apnea     had surgery to correct  . CAD (coronary artery disease)   . Hypothyroidism   . ED (erectile dysfunction)   . Other and unspecified hyperlipidemia   . Neck pain on left side 11/22/2012  . Urinary urgency 11/22/2012  . Myocardial infarction   . Dizziness   . Hypertension   . Diabetes mellitus     fasting 100-120       Medication List         aspirin EC 81 MG tablet  Take 81 mg by mouth daily.     atenolol 25 MG tablet  Commonly known as:  TENORMIN  Take 1 tablet (25 mg total) by mouth daily.     atorvastatin 40 MG tablet  Commonly known as:  LIPITOR  TAKE ONE TABLET BY MOUTH ONCE DAILY     dutasteride 0.5 MG capsule  Commonly known as:  AVODART  Take 1  capsule (0.5 mg total) by mouth daily.     freestyle lancets  Use to check blood sugar 1-2 times daily     FREESTYLE LITE test strip  Generic drug:  glucose blood  Use to test blood sugar 1 to 2 times daily     levothyroxine 50 MCG tablet  Commonly known as:  SYNTHROID, LEVOTHROID  Take 50 mcg by mouth daily before breakfast.     metFORMIN 500 MG 24 hr tablet  Commonly known as:  GLUCOPHAGE-XR  Take 2 tablets (1,000 mg total) by  mouth daily with breakfast.     multivitamin with minerals Tabs tablet  Take 1 tablet by mouth daily.     oxyCODONE 5 MG immediate release tablet  Commonly known as:  ROXICODONE  Take 1 tablet (5 mg total) by mouth every 6 (six) hours as needed for severe pain.        Roxicodone #30 No Refill  Disposition: home  Patient's condition: is Good  Follow up: 1. Dr. Imogene Burn in 2 weeks   Doreatha Massed, PA-C [facility] [phone] 04/26/2013  11:40 AM  Addendum  I have independently interviewed and examined the patient, and I agree with the physician assistant's discharge summary.  This patient underwent a right CFA to AK popliteal bypass with non-reverse ipsilateral greater saphenous vein for short distance claudication.  His operation was non-eventful with strongly dopplerable biphasic signals at the end of the case.  He had extensive diffuse calcific atherosclerosis on intraoperative exploration, so I do not expect extensive improvement in ABI.  Post-operative ABI was 0.84 in right leg.  The patient sx is improved and ambulating without difficulty on POD#1.  At this point, pt meet criteria for discharge.  He will follow up in the office in 2 weeks for staple removal and wound check.  Leonides Sake, MD [facility] [place] Office: [phone] Pager: [phone]  04/27/2013, 7:23 AM    - For VQI Registry use --- Instructions: Press F2 to tab through selections.  Delete question if not applicable.    Post-op:  Wound infection: No  Graft infection: No  Transfusion: No  If yes, n/a units given New Arrhythmia: No Ipsilateral amputation: No, [ ]  Minor, [ ]  BKA, [ ]  AKA Discharge patency: [ x] Primary, [ ]  Primary assisted, [ ]  Secondary, [ ]  Occluded Patency judged by: [x ] Dopper only, [ ]  Palpable graft pulse, [ ]  Palpable distal pulse, [ ]  ABI inc. > 0.15, [ ]  Duplex Discharge ABI: R 0.84, L 0.71 Discharge TBI: R , L  D/C Ambulatory Status: Ambulatory  Complications: MI: No,  CHF: No Resp failure:No,  Chg in renal function: No,  Stroke: No,  Return to OR: No   Discharge medications: Statin use:  Yes ASA use:  Yes Plavix use:  No  for medical reason not indicated Beta blocker use: Yes Coumadin use: No  for medical reason not indicated

## 2013-04-26 NOTE — Progress Notes (Addendum)
[facility] Progress Note  04/26/2013 8:17 AM 1 Day Post-Op  Subjective:  No complaints this am.  States that he has walked in unit this morning  Tm 99.8 now afebrile HR  60's-90's regular 110's-140's systolic 99% RA  Filed Vitals:   04/26/13 0726  BP: 116/60  Pulse: 76  Temp: 98.3 F (36.8 C)  Resp: 17    Physical Exam: Incisions:  C/d/i.  Vein harvest site with bandage over staples Extremities:  + biphasic signal of the right PT;  Right foot warm and well perfused.  CBC    Component Value Date/Time   WBC 9.5 04/26/2013 0530   RBC 3.61* 04/26/2013 0530   HGB 11.0* 04/26/2013 0530   HCT 32.5* 04/26/2013 0530   PLT 203 04/26/2013 0530   MCV 90.0 04/26/2013 0530   MCH 30.5 04/26/2013 0530   MCHC 33.8 04/26/2013 0530   RDW 13.4 04/26/2013 0530   LYMPHSABS 3.0 12/16/2011 1525   MONOABS 1.0 12/16/2011 1525   EOSABS 0.4 12/16/2011 1525   BASOSABS 0.0 12/16/2011 1525    BMET    Component Value Date/Time   NA 139 04/26/2013 0530   K 3.9 04/26/2013 0530   CL 106 04/26/2013 0530   CO2 24 04/26/2013 0530   GLUCOSE 126* 04/26/2013 0530   BUN 19 04/26/2013 0530   CREATININE 0.81 04/26/2013 0530   CREATININE 0.97 03/20/2013 1639   CALCIUM 8.1* 04/26/2013 0530   GFRNONAA 86* 04/26/2013 0530   GFRAA >90 04/26/2013 0530    INR    Component Value Date/Time   INR 0.99 04/20/2013 0834     Intake/Output Summary (Last 24 hours) at 04/26/13 0817 Last data filed at 04/26/13 0734  Gross per 24 hour  Intake   2400 ml  Output   3385 ml  Net   -985 ml   Assessment:  72 y.o. male is s/p:  Right common femoral artery to above-the-knee popliteal artery bypass with non-reversed ipsilateral greater saphenous vein   1 Day Post-Op  Plan: -pt doing well this am and bypass graft is patent -pain is well controlled -has ambulated in the unit -discontinue foley-pt needs to void -DVT prophylaxis:  Lovenox to start this am -ABI's this am-possibly discharge  later today  Doreatha Massed, PA-C [facility] [phone] 04/26/2013 8:17 AM  Addendum  I have independently interviewed and examined the patient, and I agree with the physician assistant's findings.  Pt doing well post-op.  Incision c/d/i, vein harvest inc bandaged.  Biphasic signals in DP and PT.  ABI today.  Possible home today per pt's preference.  Leonides Sake, MD [facility] [place] Office: [phone] Pager: [phone]  04/26/2013, 10:31 AM

## 2013-04-26 NOTE — Progress Notes (Signed)
VASCULAR LAB PRELIMINARY  ARTERIAL  ABI completed:    RIGHT    LEFT    PRESSURE WAVEFORM  PRESSURE WAVEFORM  BRACHIAL 108 Biphasic BRACHIAL 125 Biphasic  DP 101 Biphasic DP 89 Biphasic  PT 105 Triphasic PT 82 Monophasic    RIGHT LEFT  ABI 0.84 0.71   ABIs indicate an increase in arterial flow on the operative leg - right with a decrease post operative on the left  Harry Brown, RVS 04/26/2013, 10:39 AM

## 2013-04-26 NOTE — Progress Notes (Signed)
Give metformin per Lelon Mast, [place] with vascular. No contrast during OR.   Will DC patient, needs to void.

## 2013-04-26 NOTE — Progress Notes (Signed)
Pt. DC. All belongings sent home with patient and wife. Rx and DC instructions explained, signed, and sent home with patient. IV DC, hemostasis achieved. CCMT and eICU notified of DC. Pt. DC by wheelchair via NT.

## 2013-04-26 NOTE — Progress Notes (Signed)
Jacquelynne Guedes B. Cuma Polyakov, PT, DPT #[id number]   

## 2013-04-26 NOTE — Evaluation (Signed)
Physical Therapy Evaluation Patient Details Name: Harry Brown MRN: [id number] DOB: 04/08/1941 Today's Date: 04/26/2013 Time: 4098-1191 PT Time Calculation (min): 16 min  PT Assessment / Plan / Recommendation History of Present Illness  s/p bypass graft femoral-popliteal artery  Clinical Impression  Patient and wife report that he function is well, and that he is near baseline.  Patient tolerated therapy session including stairs and ambulation well.  Pt educated about need to continue to ambulate and perform the given strengthening exercises, even though PT doesn't think it is necessary for [place] to follow up.  Pt agrees and will be independent with his exercises.    PT Assessment  Patent does not need any further PT services    Follow Up Recommendations  No PT follow up;Supervision - Intermittent          Equipment Recommendations  None recommended by PT          Precautions / Restrictions Precautions Precautions: Fall Restrictions Weight Bearing Restrictions: No   Pertinent Vitals/Pain None reported     Mobility  Bed Mobility Bed Mobility: Not assessed Transfers Transfers: Sit to Stand;Stand to Sit Sit to Stand: 5: Supervision Stand to Sit: 5: Supervision Ambulation/Gait Ambulation/Gait Assistance: 5: Supervision Ambulation Distance (Feet): 100 Feet Assistive device: None Ambulation/Gait Assistance Details: Gait pattern is safe for ambulation with caregiver Stairs: Yes Stairs Assistance: 5: Supervision Stairs Assistance Details (indicate cue type and reason): supervision for safety due to post-op status and pain.  Stair Management Technique: One rail Right;Step to pattern;Forwards Number of Stairs: 5        PT Goals(Current goals can be found in the care plan section) Acute Rehab PT Goals Patient Stated Goal: return home PT Goal Formulation: With patient Time For Goal Achievement: 05/10/13 Potential to Achieve Goals: Good  Visit Information  Last PT  Received On: 04/26/13 History of Present Illness: s/p bypass graft femoral-popliteal artery       Prior Functioning  Home Living Family/patient expects to be discharged to:: Private residence Living Arrangements: Spouse/significant other Available Help at Discharge: Family Type of Home: House Home Access: Stairs to enter Secretary/administrator of Steps: 4 Entrance Stairs-Rails: Can reach both Home Layout: One level Home Equipment: None Prior Function Level of Independence: Independent Communication Communication: No difficulties    Cognition  Cognition Arousal/Alertness: Awake/alert Behavior During Therapy: WFL for tasks assessed/performed Overall Cognitive Status: Within Functional Limits for tasks assessed    Extremity/Trunk Assessment Upper Extremity Assessment Upper Extremity Assessment: Defer to OT evaluation Lower Extremity Assessment Lower Extremity Assessment: Overall WFL for tasks assessed;RLE deficits/detail;LLE deficits/detail RLE Deficits / Details: 4+/5 gross LE MMT LLE Deficits / Details: 5/5 gross LE MMT      End of Session PT - End of Session Equipment Utilized During Treatment: Gait belt Activity Tolerance: Patient tolerated treatment well Patient left: in chair (In w/c, going with staff to vascular test) Nurse Communication: Mobility status  GP    Harry Brown, SPT Pager:  [phone]   Harry Brown. Harry Brown, PT, DPT [phone]   04/26/2013, 12:50 PM

## 2013-04-27 ENCOUNTER — Encounter (HOSPITAL_COMMUNITY): Payer: Self-pay | Admitting: Vascular Surgery

## 2013-05-07 ENCOUNTER — Telehealth: Payer: Self-pay | Admitting: Vascular Surgery

## 2013-05-07 ENCOUNTER — Encounter: Payer: Self-pay | Admitting: Vascular Surgery

## 2013-05-07 NOTE — Telephone Encounter (Addendum)
Message copied by Fredrich Birks on Mon May 07, 2013  1:22 PM ------      Message from: Melene Plan      Created: Mon May 07, 2013 11:58 AM       THIS MAN HAD SURGERY 11/19 WITH DR Imogene Burn. HE HAS STAPLES SO NEEDS APPT IN 2 WEEKS FROM 11/19. NOTHING IN D/C OR NO MSG ------  05/07/13: spoke with pt to schedule appt, dpm

## 2013-05-10 ENCOUNTER — Encounter: Payer: Self-pay | Admitting: Vascular Surgery

## 2013-05-11 ENCOUNTER — Encounter: Payer: Self-pay | Admitting: Vascular Surgery

## 2013-05-11 ENCOUNTER — Ambulatory Visit (INDEPENDENT_AMBULATORY_CARE_PROVIDER_SITE_OTHER): Payer: Self-pay | Admitting: Vascular Surgery

## 2013-05-11 VITALS — BP 134/72 | HR 64 | Temp 98.0°F | Ht 72.0 in | Wt 222.2 lb

## 2013-05-11 DIAGNOSIS — I70219 Atherosclerosis of native arteries of extremities with intermittent claudication, unspecified extremity: Secondary | ICD-10-CM

## 2013-05-11 NOTE — Progress Notes (Signed)
[facility] [place]  Postoperative Visit  History of Present Illness  Harry Brown is a 72 y.o. year old male who presents for postoperative follow-up for: R CFA to AK pop BPG w/ NR ips GSV (Date: 04/25/13).  The patient's wounds are healing.  The patient notes near resolution of lower extremity symptoms.  The patient is able to complete their activities of daily living.  The patient's current symptoms are: none.  For VQI Use Only  PRE-ADM LIVING: Home  AMB STATUS: Ambulatory  Physical Examination  Filed Vitals:   05/11/13 0851  BP: 134/72  Pulse: 64  Temp: 98 F (36.7 C)   R groin: incision nearly healed, small opening distally without drainage Vein harvest: Incisions are healing, seroma in middle incision, staple reaction evident  Medical Decision Making  Harry Brown is a 72 y.o. year old male who presents s/p R CFA to AK pop BPG w/ NR ips GSV .  The patient's bypass incisions are mostly healing appropriately with resolution of pre-operative symptoms.  I gave the patient some instructions in regards to wound care.  The patient will follow up in 2 weeks for wound check. I discussed in depth with the patient the nature of atherosclerosis, and emphasized the importance of maximal medical management including strict control of blood pressure, blood glucose, and lipid levels, obtaining regular exercise, and cessation of smoking.  The patient is aware that without maximal medical management the underlying atherosclerotic disease process will progress, limiting the benefit of any interventions. The patient's surveillance will included ABI and bypass duplex studies which will be completed in: 3 months, at which time the patient will be re-evaluated.   I emphasized the importance of routine surveillance of the patient's bypass, as the vascular surgery literature emphasize the improved patency possible with assisted primary patency procedures versus  secondary patency procedures. The patient agrees to participate in their maximal medical care and routine surveillance.  Thank you for allowing [place] to participate in this patient's care.  Leonides Sake, MD [facility] [place] Office: [phone] Pager: [phone]  05/11/2013, 9:34 AM

## 2013-05-14 ENCOUNTER — Encounter (HOSPITAL_COMMUNITY): Payer: Self-pay | Admitting: General Practice

## 2013-05-14 ENCOUNTER — Telehealth: Payer: Self-pay | Admitting: Family Medicine

## 2013-05-14 ENCOUNTER — Encounter: Payer: Self-pay | Admitting: Family

## 2013-05-14 ENCOUNTER — Ambulatory Visit (INDEPENDENT_AMBULATORY_CARE_PROVIDER_SITE_OTHER): Payer: Medicare Other | Admitting: Family

## 2013-05-14 ENCOUNTER — Inpatient Hospital Stay (HOSPITAL_COMMUNITY)
Admission: AD | Admit: 2013-05-14 | Discharge: 2013-05-18 | DRG: 862 | Disposition: A | Discharge status: Home / Self Care | Payer: Medicare Other | Source: Ambulatory Visit | Attending: Internal Medicine | Admitting: Internal Medicine

## 2013-05-14 VITALS — BP 114/63 | HR 83 | Temp 98.9°F | Resp 12 | Ht 72.0 in | Wt 221.0 lb

## 2013-05-14 DIAGNOSIS — T814XXA Infection following a procedure, initial encounter: Secondary | ICD-10-CM

## 2013-05-14 DIAGNOSIS — IMO0002 Reserved for concepts with insufficient information to code with codable children: Secondary | ICD-10-CM | POA: Diagnosis present

## 2013-05-14 DIAGNOSIS — L0390 Cellulitis, unspecified: Secondary | ICD-10-CM | POA: Insufficient documentation

## 2013-05-14 DIAGNOSIS — M542 Cervicalgia: Secondary | ICD-10-CM

## 2013-05-14 DIAGNOSIS — R202 Paresthesia of skin: Secondary | ICD-10-CM

## 2013-05-14 DIAGNOSIS — I739 Peripheral vascular disease, unspecified: Secondary | ICD-10-CM | POA: Diagnosis present

## 2013-05-14 DIAGNOSIS — T8140XA Infection following a procedure, unspecified, initial encounter: Principal | ICD-10-CM | POA: Diagnosis present

## 2013-05-14 DIAGNOSIS — N390 Urinary tract infection, site not specified: Secondary | ICD-10-CM | POA: Diagnosis present

## 2013-05-14 DIAGNOSIS — I714 Abdominal aortic aneurysm, without rupture: Secondary | ICD-10-CM

## 2013-05-14 DIAGNOSIS — N400 Enlarged prostate without lower urinary tract symptoms: Secondary | ICD-10-CM | POA: Diagnosis present

## 2013-05-14 DIAGNOSIS — I700 Atherosclerosis of aorta: Secondary | ICD-10-CM | POA: Diagnosis present

## 2013-05-14 DIAGNOSIS — Z87891 Personal history of nicotine dependence: Secondary | ICD-10-CM

## 2013-05-14 DIAGNOSIS — E1159 Type 2 diabetes mellitus with other circulatory complications: Secondary | ICD-10-CM | POA: Diagnosis present

## 2013-05-14 DIAGNOSIS — Z79899 Other long term (current) drug therapy: Secondary | ICD-10-CM

## 2013-05-14 DIAGNOSIS — R3915 Urgency of urination: Secondary | ICD-10-CM

## 2013-05-14 DIAGNOSIS — R262 Difficulty in walking, not elsewhere classified: Secondary | ICD-10-CM | POA: Diagnosis present

## 2013-05-14 DIAGNOSIS — E039 Hypothyroidism, unspecified: Secondary | ICD-10-CM

## 2013-05-14 DIAGNOSIS — I70219 Atherosclerosis of native arteries of extremities with intermittent claudication, unspecified extremity: Secondary | ICD-10-CM

## 2013-05-14 DIAGNOSIS — L02419 Cutaneous abscess of limb, unspecified: Secondary | ICD-10-CM | POA: Diagnosis present

## 2013-05-14 DIAGNOSIS — N529 Male erectile dysfunction, unspecified: Secondary | ICD-10-CM

## 2013-05-14 DIAGNOSIS — Y849 Medical procedure, unspecified as the cause of abnormal reaction of the patient, or of later complication, without mention of misadventure at the time of the procedure: Secondary | ICD-10-CM | POA: Diagnosis present

## 2013-05-14 DIAGNOSIS — L0291 Cutaneous abscess, unspecified: Secondary | ICD-10-CM

## 2013-05-14 DIAGNOSIS — E785 Hyperlipidemia, unspecified: Secondary | ICD-10-CM | POA: Diagnosis present

## 2013-05-14 DIAGNOSIS — B351 Tinea unguium: Secondary | ICD-10-CM

## 2013-05-14 DIAGNOSIS — M545 Low back pain, unspecified: Secondary | ICD-10-CM | POA: Diagnosis present

## 2013-05-14 DIAGNOSIS — E119 Type 2 diabetes mellitus without complications: Secondary | ICD-10-CM | POA: Diagnosis present

## 2013-05-14 DIAGNOSIS — I2510 Atherosclerotic heart disease of native coronary artery without angina pectoris: Secondary | ICD-10-CM

## 2013-05-14 DIAGNOSIS — Z01810 Encounter for preprocedural cardiovascular examination: Secondary | ICD-10-CM

## 2013-05-14 DIAGNOSIS — K5790 Diverticulosis of intestine, part unspecified, without perforation or abscess without bleeding: Secondary | ICD-10-CM

## 2013-05-14 DIAGNOSIS — I10 Essential (primary) hypertension: Secondary | ICD-10-CM | POA: Diagnosis present

## 2013-05-14 DIAGNOSIS — Z7982 Long term (current) use of aspirin: Secondary | ICD-10-CM

## 2013-05-14 DIAGNOSIS — G9340 Encephalopathy, unspecified: Secondary | ICD-10-CM | POA: Diagnosis present

## 2013-05-14 DIAGNOSIS — E1151 Type 2 diabetes mellitus with diabetic peripheral angiopathy without gangrene: Secondary | ICD-10-CM | POA: Diagnosis present

## 2013-05-14 DIAGNOSIS — R0989 Other specified symptoms and signs involving the circulatory and respiratory systems: Secondary | ICD-10-CM

## 2013-05-14 DIAGNOSIS — A4901 Methicillin susceptible Staphylococcus aureus infection, unspecified site: Secondary | ICD-10-CM | POA: Diagnosis present

## 2013-05-14 DIAGNOSIS — R21 Rash and other nonspecific skin eruption: Secondary | ICD-10-CM

## 2013-05-14 HISTORY — DX: Cellulitis, unspecified: L03.90

## 2013-05-14 HISTORY — DX: Peripheral vascular disease, unspecified: I73.9

## 2013-05-14 HISTORY — DX: Type 2 diabetes mellitus without complications: E11.9

## 2013-05-14 LAB — GLUCOSE, CAPILLARY
Glucose-Capillary: 128 mg/dL — ABNORMAL HIGH (ref 70–99)
Glucose-Capillary: 137 mg/dL — ABNORMAL HIGH (ref 70–99)

## 2013-05-14 LAB — CBC
HCT: 35.5 % — ABNORMAL LOW (ref 39.0–52.0)
Hemoglobin: 12 g/dL — ABNORMAL LOW (ref 13.0–17.0)
MCH: 30.8 pg (ref 26.0–34.0)
MCHC: 33.8 g/dL (ref 30.0–36.0)
MCV: 91 fL (ref 78.0–100.0)
Platelets: 355 10*3/uL (ref 150–400)
RBC: 3.9 MIL/uL — ABNORMAL LOW (ref 4.22–5.81)
RDW: 13.3 % (ref 11.5–15.5)
WBC: 23.8 10*3/uL — ABNORMAL HIGH (ref 4.0–10.5)

## 2013-05-14 LAB — COMPREHENSIVE METABOLIC PANEL
ALT: 18 U/L (ref 0–53)
AST: 18 U/L (ref 0–37)
Albumin: 3.1 g/dL — ABNORMAL LOW (ref 3.5–5.2)
Alkaline Phosphatase: 55 U/L (ref 39–117)
BUN: 28 mg/dL — ABNORMAL HIGH (ref 6–23)
CO2: 21 mEq/L (ref 19–32)
Calcium: 9 mg/dL (ref 8.4–10.5)
Chloride: 98 mEq/L (ref 96–112)
Creatinine, Ser: 0.98 mg/dL (ref 0.50–1.35)
GFR calc Af Amer: >90 mL/min (ref >90)
GFR calc non Af Amer: 80 mL/min — ABNORMAL LOW (ref >90)
Glucose, Bld: 126 mg/dL — ABNORMAL HIGH (ref 70–99)
Potassium: 4.5 mEq/L (ref 3.5–5.1)
Sodium: 131 mEq/L — ABNORMAL LOW (ref 135–145)
Total Bilirubin: 0.9 mg/dL (ref 0.3–1.2)
Total Protein: 7.1 g/dL (ref 6.0–8.3)

## 2013-05-14 LAB — URINALYSIS, ROUTINE W REFLEX MICROSCOPIC
Glucose, UA: NEGATIVE mg/dL
Hgb urine dipstick: NEGATIVE
Ketones, ur: 15 mg/dL — AB
Nitrite: NEGATIVE
Protein, ur: NEGATIVE mg/dL
Specific Gravity, Urine: 1.024 (ref 1.005–1.030)
Urobilinogen, UA: 1 mg/dL (ref 0.0–1.0)
pH: 6 (ref 5.0–8.0)

## 2013-05-14 LAB — URINE MICROSCOPIC-ADD ON

## 2013-05-14 MED ORDER — ACETAMINOPHEN 650 MG RE SUPP: 650.0000 mg | Freq: Four times a day (QID) | RECTAL | Status: DC | PRN

## 2013-05-14 MED ORDER — METFORMIN HCL ER 500 MG PO TB24
1000.0000 mg | ORAL_TABLET | Freq: Every day | ORAL | Status: DC
  Administered 2013-05-15: 1000 mg via ORAL
  Filled 2013-05-14 (×2): qty 2

## 2013-05-14 MED ORDER — SODIUM CHLORIDE 0.9 % IV SOLN
INTRAVENOUS | Status: AC
  Administered 2013-05-14: 1000 mL via INTRAVENOUS

## 2013-05-14 MED ORDER — HYDROCODONE-ACETAMINOPHEN 5-325 MG PO TABS
1.0000 | ORAL_TABLET | ORAL | Status: DC | PRN
  Administered 2013-05-15: 1 via ORAL
  Filled 2013-05-14: qty 1

## 2013-05-14 MED ORDER — ATENOLOL 25 MG PO TABS
25.0000 mg | ORAL_TABLET | Freq: Every day | ORAL | Status: DC
  Filled 2013-05-14 (×2): qty 1

## 2013-05-14 MED ORDER — OXYCODONE HCL 5 MG PO TABS: 5.0000 mg | ORAL_TABLET | Freq: Four times a day (QID) | ORAL | Status: DC | PRN

## 2013-05-14 MED ORDER — DUTASTERIDE 0.5 MG PO CAPS
0.5000 mg | ORAL_CAPSULE | Freq: Every day | ORAL | Status: DC
  Administered 2013-05-14 – 2013-05-15 (×2): 0.5 mg via ORAL
  Filled 2013-05-14 (×2): qty 1

## 2013-05-14 MED ORDER — ACETAMINOPHEN 325 MG PO TABS
650.0000 mg | ORAL_TABLET | Freq: Four times a day (QID) | ORAL | Status: DC | PRN
  Administered 2013-05-15: 650 mg via ORAL
  Filled 2013-05-14: qty 2

## 2013-05-14 MED ORDER — INSULIN ASPART 100 UNIT/ML ~~LOC~~ SOLN
0.0000 [IU] | Freq: Three times a day (TID) | SUBCUTANEOUS | Status: DC
  Administered 2013-05-15 – 2013-05-16 (×2): 2 [IU] via SUBCUTANEOUS

## 2013-05-14 MED ORDER — ATORVASTATIN CALCIUM 40 MG PO TABS
40.0000 mg | ORAL_TABLET | Freq: Every day | ORAL | Status: DC
  Administered 2013-05-15 – 2013-05-17 (×3): 40 mg via ORAL
  Filled 2013-05-14 (×4): qty 1

## 2013-05-14 MED ORDER — ADULT MULTIVITAMIN W/MINERALS CH
1.0000 | ORAL_TABLET | Freq: Every day | ORAL | Status: DC
  Administered 2013-05-14 – 2013-05-18 (×5): 1 via ORAL
  Filled 2013-05-14 (×5): qty 1

## 2013-05-14 MED ORDER — HEPARIN SODIUM (PORCINE) 5000 UNIT/ML IJ SOLN
5000.0000 [IU] | Freq: Three times a day (TID) | INTRAMUSCULAR | Status: DC
  Administered 2013-05-14 – 2013-05-18 (×10): 5000 [IU] via SUBCUTANEOUS
  Filled 2013-05-14 (×14): qty 1

## 2013-05-14 MED ORDER — LEVOTHYROXINE SODIUM 50 MCG PO TABS
50.0000 ug | ORAL_TABLET | Freq: Every day | ORAL | Status: DC
  Administered 2013-05-15 – 2013-05-18 (×4): 50 ug via ORAL
  Filled 2013-05-14 (×6): qty 1

## 2013-05-14 MED ORDER — MORPHINE SULFATE 2 MG/ML IJ SOLN: 2.0000 mg | INTRAMUSCULAR | Status: DC | PRN

## 2013-05-14 MED ORDER — PIPERACILLIN-TAZOBACTAM 3.375 G IVPB
3.3750 g | Freq: Three times a day (TID) | INTRAVENOUS | Status: DC
  Administered 2013-05-14 – 2013-05-18 (×11): 3.375 g via INTRAVENOUS
  Filled 2013-05-14 (×13): qty 50

## 2013-05-14 MED ORDER — VANCOMYCIN HCL IN DEXTROSE 1-5 GM/200ML-% IV SOLN
1000.0000 mg | Freq: Three times a day (TID) | INTRAVENOUS | Status: DC
  Administered 2013-05-14 – 2013-05-15 (×2): 1000 mg via INTRAVENOUS
  Filled 2013-05-14 (×4): qty 200

## 2013-05-14 MED ORDER — ASPIRIN EC 81 MG PO TBEC
81.0000 mg | DELAYED_RELEASE_TABLET | Freq: Every day | ORAL | Status: DC
  Administered 2013-05-14 – 2013-05-18 (×5): 81 mg via ORAL
  Filled 2013-05-14 (×5): qty 1

## 2013-05-14 NOTE — Telephone Encounter (Signed)
Made appt for the patient

## 2013-05-14 NOTE — Assessment & Plan Note (Addendum)
72 yr old male with fever this AM- (currently afebrile). Post op from vascular procedure 11/19, now presents with wound infection and cellulitis.  Given weakness, age, poor appetite and severity of cellulitis, I have recommended inpatient admission for IV abx and blood cultures. They are agreeable to admission.    Report given to Dr. Catha Gosselin who has accepted this patient as a direct admit to a floor bed team 10.

## 2013-05-14 NOTE — Progress Notes (Signed)
Harry Brown [id number] Admitted to 5W 34: 05/14/2013 6:30 PM Attending Provider: Edsel Petrin, DO    Harry Brown is a 72 y.o. male, direct admit from doctor office, awake, alert  & orientated  X 3,  Prior, VSS - Blood pressure 116/76, pulse 84, temperature 100.3 F (37.9 C), temperature source Oral, resp. rate 24, height 6' (1.829 m), weight 99.655 kg (219 lb 11.2 oz), SpO2 96.00%., R/A, no c/o shortness of breath, no c/o chest pain, no distress noted. Paged Dr. Catha Gosselin @ [phone].   Allergies:   Allergies  Allergen Reactions  . Other Hives    Plastic shopping bags     Past Medical History  Diagnosis Date  . Diverticulitis   . History of sleep apnea     had surgery to correct  . CAD (coronary artery disease)   . Hypothyroidism   . ED (erectile dysfunction)   . Other and unspecified hyperlipidemia   . Neck pain on left side 11/22/2012  . Urinary urgency 11/22/2012  . Myocardial infarction   . Dizziness   . Hypertension   . Diabetes mellitus     fasting 100-120     Pt orientation to unit, room and routine. Information packet given to patient/family and safety video watched.  Admission INP armband ID verified with patient/family, and in place. SR up x 2, bed alarm set, placed yellow arm band & red sock on pt., verbalizing understanding of risks associated with falls. Pt verbalizes an understanding of how to use the call bell and to call for help before getting out of bed.  Skin, clean-dry- intact with redness to right groin and thigh which was marked.  Also has redness to sacrum which was blanchable.     Will cont to monitor and assist as needed.  Joana Reamer, RN 05/14/2013 6:30 PM

## 2013-05-14 NOTE — H&P (Signed)
[facility] History and Physical  Harry Brown ZOX:[id number] DOB: [birth year] DOA: 05/14/2013  Referring physician:  PCP: Danise Edge, MD  Specialists: Dr. Imogene Burn, vascular surgery  Chief Complaint: Right leg cellulitis  HPI: Harry Brown is a 72 y.o. male  With a history of diabetes, hypothyroidism, coronary disease that recently underwent femoral-popliteal bypass of the right lower extremity with Dr. Imogene Burn on 04/25/2013. Patient presented to his primary care physician today for right leg pain. He is also noted to have a temperature 101.5 at home however in the office he had a temperature of 98.9. Patient was sent to [facility] cone as a direct admission for cellulitis.  Per patient's wife, he was some what altered yesterday, however is back at his baseline.  Patient also experienced some chills while at home.  He has also been experiencing lower back pain which is achy in nature with no radiation of symptoms, but attributes it to having to wash his legs and bending.  He denies any trauma.  He also complains of difficulty walking.    Review of Systems:  Constitutional: Deniesdiaphoresis, appetite change and fatigue.  positive for fever and chills HEENT: Denies photophobia, eye pain, redness, hearing loss, ear pain, congestion, sore throat, rhinorrhea, sneezing, mouth sores, trouble swallowing, neck pain, neck stiffness and tinnitus.   Respiratory: Denies SOB, DOE, cough, chest tightness,  and wheezing.   Cardiovascular: Denies chest pain, palpitations and leg swelling.  Gastrointestinal: Denies nausea, vomiting, abdominal pain, diarrhea, constipation, blood in stool and abdominal distention.  Genitourinary: Denies dysuria, urgency, frequency, hematuria.  States he has an enlarged prostate and complains difficulty urinating.  Musculoskeletal: Positive for right lower extremity pain and problems walking. Skin: Denies pallor, rash and wound.  Neurological: Denies dizziness, seizures,  syncope, weakness, light-headedness, numbness and headaches.  Hematological: Denies adenopathy. Easy bruising, personal or family bleeding history  Psychiatric/Behavioral: Denies suicidal ideation, mood changes, confusion, nervousness, sleep disturbance and agitation  Past Medical History  Diagnosis Date  . Diverticulitis   . History of sleep apnea     had surgery to correct  . CAD (coronary artery disease)   . Hypothyroidism   . ED (erectile dysfunction)   . Other and unspecified hyperlipidemia   . Neck pain on left side 11/22/2012  . Urinary urgency 11/22/2012  . Myocardial infarction   . Dizziness   . Hypertension   . Diabetes mellitus     fasting 100-120   Past Surgical History  Procedure Laterality Date  . Coronary artery bypass graft  2001  . Tonsillectomy  2003  . Palate / uvula biopsy / excision  2003    Removed due to sleep apnea  . Cervical disc surgery    . Abdominal aortagram  Oct. 9, 2014    Dr. Imogene Burn  . Tonsillectomy    . Dental surgery    . Eye surgery      cataract   . Femoral-popliteal bypass graft Right 04/25/2013    Procedure: RIGHT FEMORAL TO ABOVE KNEE POPLITEAL BYPASS GRAFT WITH RIGHT GREATER SAPHENOUS VEIN HARVEST; ULTRASOUND GUIDED;  Surgeon: Fransisco Hertz, MD;  Location: [facility] OR;  Service: Vascular;  Laterality: Right;   Social History:  reports that he has quit smoking. He has never used smokeless tobacco. He reports that he does not drink alcohol. His drug history is not on file.   Allergies  Allergen Reactions  . Other Hives    Plastic shopping bags    Family History  Problem Relation Age of  Onset  . Hyperlipidemia Mother   . Heart disease Mother   . Diabetes Mother   . Hypertension Mother   . Heart disease Father   . Cancer Neg Hx     Prior to Admission medications   Medication Sig Start Date End Date Taking? Authorizing Provider  aspirin EC 81 MG tablet Take 81 mg by mouth daily.    Historical Provider, MD  atenolol (TENORMIN) 25 MG  tablet Take 1 tablet (25 mg total) by mouth daily. 01/02/13   Bradd Canary, MD  atorvastatin (LIPITOR) 40 MG tablet TAKE ONE TABLET BY MOUTH ONCE DAILY 04/23/13   Bradd Canary, MD  dutasteride (AVODART) 0.5 MG capsule Take 1 capsule (0.5 mg total) by mouth daily. 02/26/13   Bradd Canary, MD  FREESTYLE LITE test strip Use to test blood sugar 1 to 2 times daily 10/12/11   Historical Provider, MD  Lancets (FREESTYLE) lancets Use to check blood sugar 1-2 times daily 11/12/11   Historical Provider, MD  levothyroxine (SYNTHROID, LEVOTHROID) 50 MCG tablet Take 50 mcg by mouth daily before breakfast.    Historical Provider, MD  metFORMIN (GLUCOPHAGE-XR) 500 MG 24 hr tablet Take 2 tablets (1,000 mg total) by mouth daily with breakfast. 02/26/13   Bradd Canary, MD  Multiple Vitamin (MULTIVITAMIN WITH MINERALS) TABS tablet Take 1 tablet by mouth daily.    Historical Provider, MD  oxyCODONE (ROXICODONE) 5 MG immediate release tablet Take 1 tablet (5 mg total) by mouth every 6 (six) hours as needed for severe pain. 04/26/13   Dara Lords, PA-C   Physical Exam: Filed Vitals:   05/14/13 1828  BP: 116/76  Pulse: 84  Temp:   Resp:      General: Well developed, well nourished, NAD, appears stated age  HEENT: NCAT, PERRLA, EOMI, Anicteic Sclera, mucous membranes moist. No pharyngeal erythema or exudates  Neck: Supple, no JVD, no masses,  Cardiovascular: S1 S2 auscultated, no rubs, murmurs or gallops. Regular rate and rhythm.  Respiratory: Clear to auscultation bilaterally with equal chest rise  Abdomen: Soft, nontender, nondistended, + bowel sounds  Extremities: warm dry without cyanosis clubbing or edema.    Neuro: AAOx3, cranial nerves grossly intact. Strength 5/5 in patient's upper and lower extremities bilaterally  Skin: Without rashes exudates or nodules.  Right medial thigh erythema and swelling, incisions noted, no drainage, however warm and tender to palpation.  Psych: Normal affect  and demeanor with intact judgement and insight  Labs on Admission:  Basic Metabolic Panel: No results found for this basename: NA, K, CL, CO2, GLUCOSE, BUN, CREATININE, CALCIUM, MG, PHOS,  in the last 168 hours Liver Function Tests: No results found for this basename: AST, ALT, ALKPHOS, BILITOT, PROT, ALBUMIN,  in the last 168 hours No results found for this basename: LIPASE, AMYLASE,  in the last 168 hours No results found for this basename: AMMONIA,  in the last 168 hours CBC: No results found for this basename: WBC, NEUTROABS, HGB, HCT, MCV, PLT,  in the last 168 hours Cardiac Enzymes: No results found for this basename: CKTOTAL, CKMB, CKMBINDEX, TROPONINI,  in the last 168 hours  BNP (last 3 results) No results found for this basename: PROBNP,  in the last 8760 hours CBG:  Recent Labs Lab 05/14/13 1830  GLUCAP 128*    Radiological Exams on Admission: No results found.   Assessment/Plan Active Problems:   CAD (coronary artery disease)   Hypothyroidism   DM (diabetes mellitus)   Peripheral  vascular disease, unspecified   Cellulitis   Acute encephalopathy   Lower back pain  Right lower extremity cellulitis Will admit to medical surgical floor. Will place patient on IV antibiotics including vancomycin and Zosyn. Will obtain laboratory data including CBC and BMP. Patient did have a fever at home however at this time is currently afebrile. Will provide pain control as well. Will also contact vascular surgery.    Altered mental status/Acute Encephalopathy Back at baseline according to wife.  Will continue to monitor.  May be secondary to cellulitis.  Will also obtain lab work, urine analysis and culture, as well as blood cultures.  Lower back pain Likely musculoskeletal.  Will continue to monitor.  Patient denies falling or injury.  Will provide pain control and  Heating pad.  PVD Status post right femoral popliteal bypass Will consult vascular surgery.  Ambulatory  Dysfunction Will consult PT for eval and treatment.  BPH with difficulty urinating Continue avodart Diabetes mellitus, Type 2 Will continue metformin, along with insulin sliding scale and CBG monitoring. Will also obtain hemoglobin A1c  Coronary artery disease Currently patient is chest pain-free. Will continue to monitor. Will continue home medications of aspirin, atenolol, atorvastatin.  Hyperlipidemia Will continue atorvastatin  Hypothyroidism Will continue levothyroxin  DVT prophylaxis: Heparin  Code Status: Full  Condition: Guarded  Family Communication: Wife at bedside. Admission, patients condition and plan of care including tests being ordered have been discussed with the patient and Wife who indicate understanding and agree with the plan and Code Status.  Disposition Plan: Admitted   Time spent: 45 minutes  Mansel Strother D.O. [facility] Pager [phone]  If 7PM-7AM, please contact night-coverage [email or web address] Password [facility] 05/14/2013, 7:03 PM

## 2013-05-14 NOTE — Progress Notes (Signed)
Subjective:    Patient ID: Harry Brown, male    DOB: [birth year], 72 y.o.   MRN: [id number]  HPI  Mr. Grizzell is a 72 yr old male who presents today with complaint of pain at the top of his incision.  Had R common femoral artery popliteal bypass 11/19.  Wife reports that was allergic to the staples.  Otherwise his post op course was uneventful. Pt reports that last night he developed low back while leaning forward to wash his incisions.   Not hurting currently. This AM pt had urinary incontinence overnight.  Reports that she had trouble getting him up this AM at 9:30.  She got him up, but he went to rest room and then went back to bed. Wife thought he seemed confused. Temp 101.9 axillary. Could hardly walk.  Walking better now.  Follow up temp 101.5.  Wife reports only drank cup of hot chocolate last night and no dinner.   Denies dysuria, but has sense of incomplete emptying of his bladder. Wife thinks he has only had 1 "good BM" since he was discharged from the hospital.   Review of Systems See HPI  Past Medical History  Diagnosis Date  . Diverticulitis   . History of sleep apnea     had surgery to correct  . CAD (coronary artery disease)   . Hypothyroidism   . ED (erectile dysfunction)   . Other and unspecified hyperlipidemia   . Neck pain on left side 11/22/2012  . Urinary urgency 11/22/2012  . Myocardial infarction   . Dizziness   . Hypertension   . Diabetes mellitus     fasting 100-120    History   Social History  . Marital Status: Married    Spouse Name: N/A    Number of Children: 8   . Years of Education: N/A   Occupational History  .     Social History Main Topics  . Smoking status: Former Games developer  . Smokeless tobacco: Never Used     Comment: Uses Comit lozenges  . Alcohol Use: No  . Drug Use: Not on file  . Sexual Activity: Not on file   Other Topics Concern  . Not on file   Social History Narrative  . No narrative on file    Past Surgical History    Procedure Laterality Date  . Coronary artery bypass graft  2001  . Tonsillectomy  2003  . Palate / uvula biopsy / excision  2003    Removed due to sleep apnea  . Cervical disc surgery    . Abdominal aortagram  Oct. 9, 2014    Dr. Imogene Burn  . Tonsillectomy    . Dental surgery    . Eye surgery      cataract   . Femoral-popliteal bypass graft Right 04/25/2013    Procedure: RIGHT FEMORAL TO ABOVE KNEE POPLITEAL BYPASS GRAFT WITH RIGHT GREATER SAPHENOUS VEIN HARVEST; ULTRASOUND GUIDED;  Surgeon: Fransisco Hertz, MD;  Location: [facility] OR;  Service: Vascular;  Laterality: Right;    Family History  Problem Relation Age of Onset  . Hyperlipidemia Mother   . Heart disease Mother   . Diabetes Mother   . Hypertension Mother   . Heart disease Father   . Cancer Neg Hx     Allergies  Allergen Reactions  . Other Hives    Plastic shopping bags    Current Outpatient Prescriptions on File Prior to Visit  Medication Sig Dispense Refill  . aspirin  EC 81 MG tablet Take 81 mg by mouth daily.      Marland Kitchen atenolol (TENORMIN) 25 MG tablet Take 1 tablet (25 mg total) by mouth daily.  30 tablet  5  . atorvastatin (LIPITOR) 40 MG tablet TAKE ONE TABLET BY MOUTH ONCE DAILY  30 tablet  0  . dutasteride (AVODART) 0.5 MG capsule Take 1 capsule (0.5 mg total) by mouth daily.  30 capsule  3  . FREESTYLE LITE test strip Use to test blood sugar 1 to 2 times daily      . Lancets (FREESTYLE) lancets Use to check blood sugar 1-2 times daily      . levothyroxine (SYNTHROID, LEVOTHROID) 50 MCG tablet Take 50 mcg by mouth daily before breakfast.      . metFORMIN (GLUCOPHAGE-XR) 500 MG 24 hr tablet Take 2 tablets (1,000 mg total) by mouth daily with breakfast.  60 tablet  3  . Multiple Vitamin (MULTIVITAMIN WITH MINERALS) TABS tablet Take 1 tablet by mouth daily.      Marland Kitchen oxyCODONE (ROXICODONE) 5 MG immediate release tablet Take 1 tablet (5 mg total) by mouth every 6 (six) hours as needed for severe pain.  30 tablet  0   No  current facility-administered medications on file prior to visit.    BP 114/63  Pulse 83  Temp(Src) 98.9 F (37.2 C) (Oral)  Resp 12  Ht 6' (1.829 m)  Wt 221 lb (100.245 kg)  BMI 29.97 kg/m2       Objective:   Physical Exam  Constitutional: He is oriented to person, place, and time.  Pale weak appearing white male.  NAD  HENT:  Head: Normocephalic.  Cardiovascular: Normal rate and regular rhythm.   No murmur heard. Pulses:      Dorsalis pedis pulses are 1+ on the right side.       Posterior tibial pulses are 1+ on the right side.  Pulmonary/Chest: Effort normal and breath sounds normal. No respiratory distress. He has no wheezes. He has no rales. He exhibits no tenderness.  Neurological: He is alert and oriented to person, place, and time.  Skin:     + erythema, tenderness and induration noted right anterior upper thigh  Psychiatric: He has a normal mood and affect. His behavior is normal. Judgment and thought content normal.          Assessment & Plan:

## 2013-05-14 NOTE — Progress Notes (Signed)
ANTIBIOTIC CONSULT NOTE - INITIAL  Pharmacy Consult for Vancomycin and Zosyn Indication: cellulitis in right leg  Allergies  Allergen Reactions  . Other Hives    Plastic shopping bags    Patient Measurements: Height: 6' (182.9 cm) Weight: 219 lb 11.2 oz (99.655 kg) IBW/kg (Calculated) : 77.6 Adjusted Body Weight:   Vital Signs: Temp: 100.3 F (37.9 C) (12/08 1819) Temp src: Oral (12/08 1819) BP: 116/76 mmHg (12/08 1828) Pulse Rate: 84 (12/08 1828) Intake/Output from previous day:   Intake/Output from this shift:    Labs:  Recent Labs  05/14/13 1933  WBC 23.8*  HGB 12.0*  PLT 355  CREATININE 0.98   Estimated Creatinine Clearance: 82 ml/min (by C-G formula based on Cr of 0.98). No results found for this basename: VANCOTROUGH, Leodis Binet, VANCORANDOM, GENTTROUGH, GENTPEAK, GENTRANDOM, TOBRATROUGH, TOBRAPEAK, TOBRARND, AMIKACINPEAK, AMIKACINTROU, AMIKACIN,  in the last 72 hours   Microbiology: Recent Results (from the past 720 hour(s))  SURGICAL PCR SCREEN     Status: None   Collection Time    04/20/13  8:23 AM      Result Value Range Status   MRSA, PCR NEGATIVE  NEGATIVE Final   Staphylococcus aureus NEGATIVE  NEGATIVE Final   Comment:            The Xpert SA Assay (FDA     approved for NASAL specimens     in patients over 20 years of age),     is one component of     a comprehensive surveillance     program.  Test performance has     been validated by [employer] for patients greater     than or equal to 90 year old.     It is not intended     to diagnose infection nor to     guide or monitor treatment.    Medical History: Past Medical History  Diagnosis Date  . Diverticulitis   . CAD (coronary artery disease)   . Hypothyroidism   . ED (erectile dysfunction)   . Other and unspecified hyperlipidemia   . Neck pain on left side 11/22/2012  . Urinary urgency 11/22/2012  . Dizziness   . Hypertension   . Myocardial infarction 11/05/1985  . History  of sleep apnea     had surgery to correct  . Type II diabetes mellitus     fasting 100-120  . Peripheral vascular disease   . Cellulitis 05/14/2013    RLE    Medications:  Scheduled:  . aspirin EC  81 mg Oral Daily  . atenolol  25 mg Oral Daily  . [START ON 05/15/2013] atorvastatin  40 mg Oral q1800  . dutasteride  0.5 mg Oral Daily  . heparin  5,000 Units Subcutaneous Q8H  . [START ON 05/15/2013] insulin aspart  0-15 Units Subcutaneous TID WC  . [START ON 05/15/2013] levothyroxine  50 mcg Oral QAC breakfast  . [START ON 05/15/2013] metFORMIN  1,000 mg Oral Q breakfast  . multivitamin with minerals  1 tablet Oral Daily  . piperacillin-tazobactam (ZOSYN)  IV  3.375 g Intravenous Q8H  . vancomycin  1,000 mg Intravenous Q8H   Assessment: 72 yr old male came from MD office as a direct admit. He underwent fem-pop bypass on 11/19. He now appears to have developed cellulitis at the surgical site. Pharmacy has been asked to dose vancomycin and zosyn for cellulitis.  Goal of Therapy:  Vancomycin trough level 10-15 mcg/ml  Plan:  Zosyn 3.375 Gm IV q8h Vancomycin 1 Gm q8h. Vanc trough when appropriate.  Harry Brown 05/14/2013,8:53 PM

## 2013-05-14 NOTE — Patient Instructions (Signed)
Please go directly to [facility] Admitting.

## 2013-05-15 ENCOUNTER — Inpatient Hospital Stay (HOSPITAL_COMMUNITY): Payer: Medicare Other

## 2013-05-15 DIAGNOSIS — T8140XA Infection following a procedure, unspecified, initial encounter: Principal | ICD-10-CM

## 2013-05-15 DIAGNOSIS — Z0279 Encounter for issue of other medical certificate: Secondary | ICD-10-CM

## 2013-05-15 LAB — BASIC METABOLIC PANEL
BUN: 28 mg/dL — ABNORMAL HIGH (ref 6–23)
CO2: 26 mEq/L (ref 19–32)
Calcium: 8.7 mg/dL (ref 8.4–10.5)
Chloride: 99 mEq/L (ref 96–112)
Creatinine, Ser: 1.02 mg/dL (ref 0.50–1.35)
GFR calc Af Amer: 82 mL/min — ABNORMAL LOW (ref >90)
GFR calc non Af Amer: 71 mL/min — ABNORMAL LOW (ref >90)
Glucose, Bld: 125 mg/dL — ABNORMAL HIGH (ref 70–99)
Potassium: 4.2 mEq/L (ref 3.5–5.1)
Sodium: 133 mEq/L — ABNORMAL LOW (ref 135–145)

## 2013-05-15 LAB — CBC
HCT: 33 % — ABNORMAL LOW (ref 39.0–52.0)
Hemoglobin: 10.9 g/dL — ABNORMAL LOW (ref 13.0–17.0)
MCH: 29.9 pg (ref 26.0–34.0)
MCHC: 33 g/dL (ref 30.0–36.0)
MCV: 90.7 fL (ref 78.0–100.0)
Platelets: 330 10*3/uL (ref 150–400)
RBC: 3.64 MIL/uL — ABNORMAL LOW (ref 4.22–5.81)
RDW: 13.1 % (ref 11.5–15.5)
WBC: 18.3 10*3/uL — ABNORMAL HIGH (ref 4.0–10.5)

## 2013-05-15 LAB — HEMOGLOBIN A1C
Hgb A1c MFr Bld: 6.9 % — ABNORMAL HIGH (ref <5.7)
Mean Plasma Glucose: 151 mg/dL — ABNORMAL HIGH (ref <117)

## 2013-05-15 LAB — GLUCOSE, CAPILLARY
Glucose-Capillary: 126 mg/dL — ABNORMAL HIGH (ref 70–99)
Glucose-Capillary: 118 mg/dL — ABNORMAL HIGH (ref 70–99)
Glucose-Capillary: 114 mg/dL — ABNORMAL HIGH (ref 70–99)
Glucose-Capillary: 105 mg/dL — ABNORMAL HIGH (ref 70–99)

## 2013-05-15 MED ORDER — MAGNESIUM HYDROXIDE 400 MG/5ML PO SUSP
30.0000 mL | Freq: Once | ORAL | Status: AC
  Administered 2013-05-15: 30 mL via ORAL
  Filled 2013-05-15: qty 30

## 2013-05-15 MED ORDER — DUTASTERIDE 0.5 MG PO CAPS
0.5000 mg | ORAL_CAPSULE | Freq: Every day | ORAL | Status: DC
  Administered 2013-05-16 – 2013-05-18 (×3): 0.5 mg via ORAL
  Filled 2013-05-15 (×3): qty 1

## 2013-05-15 MED ORDER — ATENOLOL 25 MG PO TABS
25.0000 mg | ORAL_TABLET | Freq: Every day | ORAL | Status: DC
  Administered 2013-05-16 – 2013-05-18 (×3): 25 mg via ORAL
  Filled 2013-05-15 (×3): qty 1

## 2013-05-15 MED ORDER — IOHEXOL 300 MG/ML  SOLN
100.0000 mL | Freq: Once | INTRAMUSCULAR | Status: AC | PRN
  Administered 2013-05-15: 100 mL via INTRAVENOUS

## 2013-05-15 MED ORDER — TROLAMINE SALICYLATE 10 % EX CREA
TOPICAL_CREAM | Freq: Two times a day (BID) | CUTANEOUS | Status: DC | PRN
  Filled 2013-05-15: qty 85

## 2013-05-15 MED ORDER — VANCOMYCIN HCL IN DEXTROSE 1-5 GM/200ML-% IV SOLN
1000.0000 mg | Freq: Two times a day (BID) | INTRAVENOUS | Status: DC
Start: 2013-05-15 — End: 2013-05-18
  Administered 2013-05-15 – 2013-05-18 (×6): 1000 mg via INTRAVENOUS
  Filled 2013-05-15 (×7): qty 200

## 2013-05-15 NOTE — Progress Notes (Signed)
[facility] PROGRESS NOTE  EDSON DERIDDER ZOX:[id number] DOB: 12/08/1940 DOA: 05/14/2013 PCP: Danise Edge, MD  Assessment/Plan:  Hx of PVD with Right lower extremity cellulitis over recent surgical site. Over Fem/Pop bypass site (surgery 04/25/13) On Vanc/Zosyn. Wound cultures being checked. Check CT pelvis and right thigh Vascular following patient.   Dr. Imogene Burn recommends BID washing with soap & water then apply dry dressing.  Altered mental status/Acute Encephalopathy  Back at baseline according to wife.  May be secondary to cellulitis.  Urine is negative for infection.  It does not appear that blood cultures were drawn.  Lower back pain  Likely musculoskeletal.  Will provide pain control and Heating pad.   Ambulatory Dysfunction  PT recommends intermittent supervision   BPH with difficulty urinating  Continue avodart   Diabetes mellitus, Type 2   insulin sliding scale and CBG monitoring.  Hold Metformin as we are ordering CT with contrast.  hemoglobin A1c 6.9  Coronary artery disease  Currently patient is chest pain-free.  Will continue home medications of aspirin, atenolol, atorvastatin.   Hyperlipidemia  Will continue atorvastatin   Hypothyroidism  Will continue levothyroxine   DVT Prophylaxis:  heparin  Code Status: full Family Communication: wife at bedside Disposition Plan: home when appropriate.   Consultants:  vascular  Procedures:    Antibiotics:  vanc / zosyn  HPI/Subjective: Complains of more weeping from incision site and constipation. (giving MOM)  Objective: Filed Vitals:   05/14/13 1819 05/14/13 1828 05/14/13 2200 05/15/13 0551  BP: 98/65 116/76 126/74 100/70  Pulse: 87 84 85 85  Temp: 100.3 F (37.9 C)  99.8 F (37.7 C) 99.3 F (37.4 C)  TempSrc: Oral  Oral Oral  Resp: 24  20   Height:  6' (1.829 m)    Weight:  99.655 kg (219 lb 11.2 oz)    SpO2: 96%  96%     Intake/Output Summary (Last 24 hours) at 05/15/13  1120 Last data filed at 05/15/13 0856  Gross per 24 hour  Intake    250 ml  Output   1200 ml  Net   -950 ml   Filed Weights   05/14/13 1828  Weight: 99.655 kg (219 lb 11.2 oz)    Exam: General: Well developed, well nourished, NAD, appears stated age  HEENT:  PERR, EOMI, Anicteic Sclera, MMM. No pharyngeal erythema or exudates  Neck: Supple, no JVD, no masses  Cardiovascular: RRR, S1 S2 auscultated, no rubs, murmurs or gallops.   Respiratory: Clear to auscultation bilaterally with equal chest rise  Abdomen: Soft, nontender, nondistended, + bowel sounds  Extremities: warm dry without cyanosis clubbing or edema.  Neuro: AAOx3, cranial nerves grossly intact. Strength 5/5 in upper and lower extremities  Skin: Medial thigh reddened, indurated, incision site weeping.  Erythema extending beyond original tracing. Psych: Normal affect and demeanor with intact judgement and insight       Data Reviewed: Basic Metabolic Panel:  Recent Labs Lab 05/14/13 1933 05/15/13 0528  NA 131* 133*  K 4.5 4.2  CL 98 99  CO2 21 26  GLUCOSE 126* 125*  BUN 28* 28*  CREATININE 0.98 1.02  CALCIUM 9.0 8.7   Liver Function Tests:  Recent Labs Lab 05/14/13 1933  AST 18  ALT 18  ALKPHOS 55  BILITOT 0.9  PROT 7.1  ALBUMIN 3.1*   CBC:  Recent Labs Lab 05/14/13 1933 05/15/13 0528  WBC 23.8* 18.3*  HGB 12.0* 10.9*  HCT 35.5* 33.0*  MCV 91.0 90.7  PLT  355 330   Cardiac Enzymes: CBG:  Recent Labs Lab 05/14/13 1830 05/14/13 2158 05/15/13 0758  GLUCAP 128* 137* 126*      Studies: No results found.  Scheduled Meds: . aspirin EC  81 mg Oral Daily  . atenolol  25 mg Oral Daily  . atorvastatin  40 mg Oral q1800  . dutasteride  0.5 mg Oral Daily  . heparin  5,000 Units Subcutaneous Q8H  . insulin aspart  0-15 Units Subcutaneous TID WC  . levothyroxine  50 mcg Oral QAC breakfast  . magnesium hydroxide  30 mL Oral Once  . multivitamin with minerals  1 tablet Oral Daily  .  piperacillin-tazobactam (ZOSYN)  IV  3.375 g Intravenous Q8H  . vancomycin  1,000 mg Intravenous Q12H   Continuous Infusions:   Principal Problem:   Cellulitis Active Problems:   Peripheral vascular disease, unspecified   Acute encephalopathy   CAD (coronary artery disease)   Hypothyroidism   DM (diabetes mellitus)   Lower back pain   Ambulatory dysfunction   BPH (benign prostatic hypertrophy)   Conley Canal  [facility] Pager [phone]. If 7PM-7AM, please contact night-coverage at [email or web address], password [facility] 05/15/2013, 11:20 AM  LOS: 1 day   Addendum  I have personally evaluated patient on 05/15/13 and agree with the above findings. Plan has been formulated with Algis Downs PA. On this morning's assessment we noticed increased swelling and erythema over the right thigh region, with patient and family stating that this had worsened since admission. On 04/15/13 he had a right femoral above the knee popliteal bypass graft done by Dr Leonides Sake of vascular surgery. Patient with h/o peripheral vascular disease. He presented to the ED on 05/14/13 with a fever of 101.5, worsening thigh erythema and swelling. Was started on  Vanc and Zosyn. Dr Imogene Burn saw him earlier this morning. Given worsening presentation we ordered a CT this morning. CT showed 3 separate abscesses involving the right leg. I called Dr Nicky Pugh office and was informed that he was off this afternoon, referred me to his partner Dr Darrick Penna. I notified Dr Darrick Penna of my concerns for this patient, CT scan findings, high white count and the BP of 94/48. Patient septic and needed surgery evaluation promptly. Will monitor closely. Await further recommendations from Vascular Surgery.

## 2013-05-15 NOTE — Progress Notes (Signed)
UR completed. Alegandro Macnaughton RN CCM Case Mgmt 

## 2013-05-15 NOTE — Progress Notes (Signed)
   Daily Progress Note  Assessment/Planning: s/p R CFA to AK pop BPG w/ NR ips GSV (04/25/13) complicated with seroma and possible cellulitis, possible UTI   Would not immediate proceed with drainage seroma.  Wash incision BID with soap and water and apply dry dressing  Continue abx  Suspect +UA might be accurate, awaiting Ucx  Subjective   AMS improved  Objective Filed Vitals:   05/14/13 1819 05/14/13 1828 05/14/13 2200 05/15/13 0551  BP: 98/65 116/76 126/74 100/70  Pulse: 87 84 85 85  Temp: 100.3 F (37.9 C)  99.8 F (37.7 C) 99.3 F (37.4 C)  TempSrc: Oral  Oral Oral  Resp: 24  20   Height:  6' (1.829 m)    Weight:  219 lb 11.2 oz (99.655 kg)    SpO2: 96%  96%     Intake/Output Summary (Last 24 hours) at 05/15/13 1022 Last data filed at 05/15/13 0856  Gross per 24 hour  Intake    250 ml  Output   1200 ml  Net   -950 ml    PULM  CTAB CV  RRR GI  soft, NTND VASC  R groin inc unchanged, R vein harvest incisions draining serosanguinous fluid, ballotable mass in medial thgih  Laboratory CBC    Component Value Date/Time   WBC 18.3* 05/15/2013 0528   HGB 10.9* 05/15/2013 0528   HCT 33.0* 05/15/2013 0528   PLT 330 05/15/2013 0528    BMET    Component Value Date/Time   NA 133* 05/15/2013 0528   K 4.2 05/15/2013 0528   CL 99 05/15/2013 0528   CO2 26 05/15/2013 0528   GLUCOSE 125* 05/15/2013 0528   BUN 28* 05/15/2013 0528   CREATININE 1.02 05/15/2013 0528   CREATININE 0.97 03/20/2013 1639   CALCIUM 8.7 05/15/2013 0528   GFRNONAA 71* 05/15/2013 0528   GFRAA 82* 05/15/2013 0528    Leonides Sake, MD [facility] [place] Office: [phone] Pager: [phone]  05/15/2013, 10:22 AM

## 2013-05-15 NOTE — Progress Notes (Signed)
Called by medical service regarding CT findings of abscess right leg. Findings discussed with pt and wife.  On exam right medial thigh erythematous, small amount of purulent drainage upper inner thigh incision.  Wound opened slightly and approximately 100 ml chocolate milk character fluid evacuated.  Incision just below this also opened and clear yellow fluid evacuated.  The dark purulent material was sent for culture.  Will notify Dr Imogene Burn of findings.  Follow up cultures.  Continue antibiotics.   Fabienne Bruns, MD [facility] [place] Office: [phone] Pager: [phone]

## 2013-05-15 NOTE — Care Management Note (Signed)
    Page 1 of 1   05/18/2013     10:53:38 AM   CARE MANAGEMENT NOTE 05/18/2013  Patient:  Harry Brown, Harry Brown   Account Number:  [id number]  Date Initiated:  05/15/2013  Documentation initiated by:  [facility]  Subjective/Objective Assessment:   Fem Pop Bypass Graft on 04/25/2013, redness at incision site, IV abx     Action/Plan:   pt eval- no pt follow up.   Anticipated DC Date:  05/18/2013   Anticipated DC Plan:  HOME W HOME HEALTH SERVICES      DC Planning Services  CM consult      [facility] Choice  HOME HEALTH   Choice offered to / List presented to:  C-1 Patient        HH arranged  HH-1 RN      [facility] agency  [facility]   Status of service:  Completed, signed off Medicare Important Message given?   (If response is "NO", the following Medicare IM given date fields will be blank) Date Medicare IM given:   Date Additional Medicare IM given:    Discharge Disposition:  HOME W HOME HEALTH SERVICES  Per UR Regulation:  Reviewed for med. necessity/level of care/duration of stay  If discussed at Long Length of Stay Meetings, dates discussed:    Comments:  05/18/13 16:49 Letha Cape RN, BSN [phone] patient lives with spouse, NCM will continue to follow for dc needs.  Patient will need [facility] for wound care, patient chose [facility], referral made to [facility], Lupita Leash notified.  Soc will begin 24-48 hrs post discharge.  05/15/2013 1246 UR completed. Isidoro Donning RN CCM Case Mgmt

## 2013-05-15 NOTE — Evaluation (Signed)
Physical Therapy Evaluation Patient Details Name: Harry Brown MRN: [id number] DOB: Sep 19, 1940 Today's Date: 05/15/2013 Time: 4098-1191 PT Time Calculation (min): 33 min  PT Assessment / Plan / Recommendation History of Present Illness  Cellulitis from right femoral popliteal bypass graft  Clinical Impression  This patient presents with acute pain and decreased functional independence following the above mentioned procedure. At the time of PT eval, pt required supervision for functional mobility and transfers, and min guard for ambulation without an AD. Ambulation ended due to draining wound which was not covered by a dressing, however confident that pt is able to negotiate the steps with wife's assistance to safely enter their home. This patient is appropriate for skilled PT interventions in the acute care setting to address functional limitations, improve safety and independence with functional mobility, and return to PLOF.     PT Assessment  Patient needs continued PT services    Follow Up Recommendations  No PT follow up;Supervision - Intermittent    Does the patient have the potential to tolerate intense rehabilitation      Barriers to Discharge        Equipment Recommendations  None recommended by PT    Recommendations for Other Services     Frequency Min 3X/week    Precautions / Restrictions Precautions Precautions: Fall Restrictions Weight Bearing Restrictions: No   Pertinent Vitals/Pain Pt reports no pain throughout session      Mobility  Bed Mobility Bed Mobility: Supine to Sit;Sitting - Scoot to Edge of Bed Supine to Sit: 5: Supervision;HOB flat;With rails Sitting - Scoot to Edge of Bed: 5: Supervision Details for Bed Mobility Assistance: VC's for sequencing and technique. No physical assist required.  Transfers Transfers: Sit to Stand;Stand to Sit Sit to Stand: 5: Supervision Stand to Sit: 5: Supervision Details for Transfer Assistance: Pt  demonstrated proper hand placement and for controlled descent to chair. No physical assist required, however pt appeared slightly unsteady once at full stand.  Ambulation/Gait Ambulation/Gait Assistance: 4: Min guard Ambulation Distance (Feet): 175 Feet Assistive device: None Ambulation/Gait Assistance Details: Pt and wife report he is not at baseline for ambulation. He appeared slightly unsteady at times, however did not have any LOB or require assist.  Gait Pattern: Step-through pattern;Decreased stride length;Decreased trunk rotation Gait velocity: Decreased General Gait Details: Wound draining during ambulation as pt did not have a dressing covering it.  Stairs: No    Exercises     PT Diagnosis: Difficulty walking  PT Problem List: Decreased strength;Decreased range of motion;Decreased activity tolerance;Decreased balance;Decreased mobility;Decreased knowledge of use of DME;Decreased skin integrity PT Treatment Interventions: Gait training;Stair training;Functional mobility training;Therapeutic activities;Therapeutic exercise;Patient/family education;Balance training     PT Goals(Current goals can be found in the care plan section) Acute Rehab PT Goals Patient Stated Goal: return home PT Goal Formulation: With patient Time For Goal Achievement: 05/22/13 Potential to Achieve Goals: Good  Visit Information  Last PT Received On: 05/15/13 Assistance Needed: +1 History of Present Illness: Cellulitis from right femoral popliteal bypass graft       Prior Functioning  Home Living Family/patient expects to be discharged to:: Private residence Living Arrangements: Spouse/significant other Available Help at Discharge: Family;Available 24 hours/day Type of Home: House Home Access: Stairs to enter [employer] of Steps: 3 Entrance Stairs-Rails: Can reach both Home Layout: One level Home Equipment: None Prior Function Level of Independence:  Independent Communication Communication: No difficulties Dominant Hand: Right    Cognition  Cognition Arousal/Alertness: Awake/alert Behavior During Therapy:  WFL for tasks assessed/performed Overall Cognitive Status: Within Functional Limits for tasks assessed    Extremity/Trunk Assessment Upper Extremity Assessment Upper Extremity Assessment: Overall WFL for tasks assessed Lower Extremity Assessment Lower Extremity Assessment: Overall WFL for tasks assessed RLE Deficits / Details: 4+/5 gross LE MMT LLE Deficits / Details: 5/5 gross LE MMT Cervical / Trunk Assessment Cervical / Trunk Assessment: Normal   Balance    End of Session PT - End of Session Equipment Utilized During Treatment: Gait belt Activity Tolerance: Patient tolerated treatment well Patient left: in chair;with call bell/phone within reach;with family/visitor present Nurse Communication: Mobility status  GP     Ruthann Cancer 05/15/2013, 10:03 AM  Ruthann Cancer, PT, DPT [phone]

## 2013-05-15 NOTE — Progress Notes (Signed)
Pt's BP 94/48 Pulse 57. No complaints of dizziness. Pt asymptomatic. MD notified. Order received to hold Atenolol.

## 2013-05-16 DIAGNOSIS — G9340 Encephalopathy, unspecified: Secondary | ICD-10-CM

## 2013-05-16 DIAGNOSIS — I70219 Atherosclerosis of native arteries of extremities with intermittent claudication, unspecified extremity: Secondary | ICD-10-CM

## 2013-05-16 LAB — URINE CULTURE
Colony Count: NO GROWTH
Culture: NO GROWTH

## 2013-05-16 LAB — BASIC METABOLIC PANEL
BUN: 23 mg/dL (ref 6–23)
CO2: 23 mEq/L (ref 19–32)
Calcium: 8.3 mg/dL — ABNORMAL LOW (ref 8.4–10.5)
Chloride: 101 mEq/L (ref 96–112)
Creatinine, Ser: 1.01 mg/dL (ref 0.50–1.35)
GFR calc Af Amer: 83 mL/min — ABNORMAL LOW (ref >90)
GFR calc non Af Amer: 72 mL/min — ABNORMAL LOW (ref >90)
Glucose, Bld: 101 mg/dL — ABNORMAL HIGH (ref 70–99)
Potassium: 4.1 mEq/L (ref 3.5–5.1)
Sodium: 135 mEq/L (ref 135–145)

## 2013-05-16 LAB — CBC
HCT: 33 % — ABNORMAL LOW (ref 39.0–52.0)
Hemoglobin: 10.9 g/dL — ABNORMAL LOW (ref 13.0–17.0)
MCH: 30 pg (ref 26.0–34.0)
MCHC: 33 g/dL (ref 30.0–36.0)
MCV: 90.9 fL (ref 78.0–100.0)
Platelets: 300 10*3/uL (ref 150–400)
RBC: 3.63 MIL/uL — ABNORMAL LOW (ref 4.22–5.81)
RDW: 13.1 % (ref 11.5–15.5)
WBC: 13.4 10*3/uL — ABNORMAL HIGH (ref 4.0–10.5)

## 2013-05-16 LAB — GLUCOSE, CAPILLARY
Glucose-Capillary: 114 mg/dL — ABNORMAL HIGH (ref 70–99)
Glucose-Capillary: 124 mg/dL — ABNORMAL HIGH (ref 70–99)
Glucose-Capillary: 92 mg/dL (ref 70–99)
Glucose-Capillary: 124 mg/dL — ABNORMAL HIGH (ref 70–99)

## 2013-05-16 NOTE — Progress Notes (Addendum)
[facility] [place]  Subjective  - No new complains.     Objective 116/59 70 98.4 F (36.9 C) (Oral) 18 97%  Intake/Output Summary (Last 24 hours) at 05/16/13 0811 Last data filed at 05/16/13 0500  Gross per 24 hour  Intake    900 ml  Output    450 ml  Net    450 ml    Cultures gram positive cocci in pairs Distally skin warm  medial superior incisions packing removed Wet to dry placed  Assessment/Planning: Post-op vein harvest site infection/hematoma Continue Zosyn and Vancomycin Drainage of right medial thigh 2 4 x 4 sponges placed in wound Wet to dry daily dressing changes  Clinton Gallant [facility] 05/16/2013 8:11 AM --  Laboratory Lab Results:  Recent Labs  05/15/13 0528 05/16/13 0540  WBC 18.3* 13.4*  HGB 10.9* 10.9*  HCT 33.0* 33.0*  PLT 330 300   BMET  Recent Labs  05/15/13 0528 05/16/13 0540  NA 133* 135  K 4.2 4.1  CL 99 101  CO2 26 23  GLUCOSE 125* 101*  BUN 28* 23  CREATININE 1.02 1.01  CALCIUM 8.7 8.3*    COAG Lab Results  Component Value Date   INR 0.99 04/20/2013   No results found for this basename: PTT   Addendum  I have independently interviewed and examined the patient, and I agree with the physician assistant's findings.  Pt essentially was in process of auto-draining his fluid collections.  Appreciate Dr. Darrick Penna' assistance.  Distal fluid collection was seroma.  Medial superior collection was infected hematoma.  Some fluid along distal bypass also but exam not consistent with abscess.  Continue abx and wet-to-dry dressing changes.  Awaiting C&S.    Leonides Sake, MD [facility] [place] Office: [phone] Pager: [phone]  05/16/2013, 5:02 PM

## 2013-05-16 NOTE — Progress Notes (Signed)
Addendum  Patient seen and examined, chart and data base reviewed.  I agree with the above assessment and plan.  For full details please see Mrs. Algis Downs PA note.  RLE cellulitis with 3 abscesses, bedside drainage by Dr. Darrick Penna, on Zosyn and vancomycin.   Clint Lipps, MD [facility] Pager: [phone] 05/16/2013, 4:16 PM

## 2013-05-16 NOTE — Progress Notes (Signed)
[facility] PROGRESS NOTE  Harry Brown AVW:[id number] DOB: 1940/06/09 DOA: 05/14/2013 PCP: Danise Edge, MD  Assessment/Plan:  Hx of PVD with Right lower extremity cellulitis with 3 abscesses in thigh over recent surgical site. Over Fem/Pop bypass site (surgery 04/25/13) On Vanc/Zosyn. Wound cultures = abundant gram positive cocci.  Sensitivities are still pending. 3 abscesses found on CT of right pelvis and femur. Vascular following patient.  Have attempted bedside drainage.  Altered mental status/Acute Encephalopathy  Back at baseline according to wife - resolved. May be secondary to cellulitis.  Urine is negative for infection.  It does not appear that blood cultures were drawn.  Hypotension atenolol and avodart have hold parameters.  Lower back pain  Likely musculoskeletal.  Will provide pain control and Heating pad.   Ambulatory Dysfunction  PT recommends intermittent supervision  BPH with difficulty urinating  Continue avodart   Diabetes mellitus, Type 2   insulin sliding scale and CBG monitoring.  Hold Metformin as we are ordering CT with contrast.  hemoglobin A1c 6.9  Coronary artery disease  Currently patient is chest pain-free.  Will continue home medications of aspirin, atenolol, atorvastatin.   Hyperlipidemia  Will continue atorvastatin   Hypothyroidism  Will continue levothyroxine   DVT Prophylaxis:  heparin  Code Status: full Family Communication: wife at bedside Disposition Plan: home when appropriate.   Consultants:  vascular  Procedures:    Antibiotics:  vanc / zosyn  HPI/Subjective: Reports right thigh has improved with drainage.  Otherwise feels fine.  Objective: Filed Vitals:   05/15/13 2059 05/16/13 0500 05/16/13 1039 05/16/13 1441  BP: 110/74 116/59 115/76 108/70  Pulse: 82 70 80 74  Temp: 98.8 F (37.1 C) 98.4 F (36.9 C)  98.6 F (37 C)  TempSrc: Oral Oral  Oral  Resp: 18 18  18   Height:      Weight:       SpO2: 97%   95%    Intake/Output Summary (Last 24 hours) at 05/16/13 1539 Last data filed at 05/16/13 1300  Gross per 24 hour  Intake   1130 ml  Output    250 ml  Net    880 ml   Filed Weights   05/14/13 1828  Weight: 99.655 kg (219 lb 11.2 oz)    Exam: General: Well developed, well nourished, NAD, appears stated age  HEENT:  PERR, EOMI, Anicteic Sclera, MMM. No pharyngeal erythema or exudates  Neck: Supple, no JVD, no masses  Cardiovascular: RRR, S1 S2 auscultated, no rubs, murmurs or gallops.   Respiratory: Clear to auscultation bilaterally with equal chest rise  Abdomen: Soft, nontender, nondistended, + bowel sounds  Extremities: warm dry without cyanosis clubbing or edema.  Neuro: AAOx3, cranial nerves grossly intact. Strength 5/5 in upper and lower extremities  Skin: Medial right thigh appears less erythematous, but still indurated.  Bandage is clean and dry. Psych: Normal affect and demeanor with intact judgement and insight   Data Reviewed: Basic Metabolic Panel:  Recent Labs Lab 05/14/13 1933 05/15/13 0528 05/16/13 0540  NA 131* 133* 135  K 4.5 4.2 4.1  CL 98 99 101  CO2 21 26 23   GLUCOSE 126* 125* 101*  BUN 28* 28* 23  CREATININE 0.98 1.02 1.01  CALCIUM 9.0 8.7 8.3*   Liver Function Tests:  Recent Labs Lab 05/14/13 1933  AST 18  ALT 18  ALKPHOS 55  BILITOT 0.9  PROT 7.1  ALBUMIN 3.1*   CBC:  Recent Labs Lab 05/14/13 1933 05/15/13 0528 05/16/13  0540  WBC 23.8* 18.3* 13.4*  HGB 12.0* 10.9* 10.9*  HCT 35.5* 33.0* 33.0*  MCV 91.0 90.7 90.9  PLT 355 330 300   Cardiac Enzymes: CBG:  Recent Labs Lab 05/15/13 1211 05/15/13 1710 05/15/13 2300 05/16/13 0816 05/16/13 1216  GLUCAP 118* 114* 105* 114* 124*      Studies: Ct Pelvis W Contrast  05/15/2013   CLINICAL DATA:  Right lower extremity cellulitis.  EXAM: CT PELVIS AND RIGHT LOWER EXTREMITY WITH CONTRAST  TECHNIQUE: Multidetector CT imaging of the pelvis and right lower  extremity was performed according to the standard protocol following bolus administration of intravenous contrast. Multiplanar CT image reconstructions were also generated.  CONTRAST:  OMNIPAQUE IOHEXOL 300 MG/ML  SOLN  COMPARISON:  None.  FINDINGS: CT PELVIS FINDINGS  The pelvis is unremarkable. No intrapelvic abnormality is identified. There is mild median lobe hypertrophy of the prostate gland impression on the base the bladder. Extensive atherosclerotic calcifications involving the aorta and iliac vessels but no focal aneurysm. The bony pelvis is intact.  CT RIGHT LOWER EXTREMITY FINDINGS  There is a elongated abscess in the upper right medial thigh. This measures approximately 7.2 x 4.2 x 2.6 cm.  More inferiorly there is a 2nd abscess at the mid femur level medially. This measures 5.3 x 3.1 x 2.8 cm.  More distally in the medial aspect of the leg just above the knee joint is a 3rd abscess measuring 8.9 x 4.6 x 2.0 cm. This is wrapping around the medial aspect of the vastus medialis muscle.  There is significant surrounding cellulitis. No findings for myofasciitis or pyomyositis.  No acute bony findings. No findings to suggest osteomyelitis or septic arthritis. Advanced arterial calcifications are noted.  IMPRESSION: 1. Three separate abscesses involving the right leg as described above. 2. Diffuse cellulitis but no findings for myofasciitis or pyomyositis. 3. No findings for osteomyelitis or septic arthritis. 4. Advanced atherosclerotic calcifications involving the aorta, iliac and femoral vessels.   Electronically Signed   By: Loralie Champagne M.D.   On: 05/15/2013 13:53   Ct Femur Right W Contrast  05/15/2013   CLINICAL DATA:  Right lower extremity cellulitis.  EXAM: CT PELVIS AND RIGHT LOWER EXTREMITY WITH CONTRAST  TECHNIQUE: Multidetector CT imaging of the pelvis and right lower extremity was performed according to the standard protocol following bolus administration of intravenous contrast.  Multiplanar CT image reconstructions were also generated.  CONTRAST:  OMNIPAQUE IOHEXOL 300 MG/ML  SOLN  COMPARISON:  None.  FINDINGS: CT PELVIS FINDINGS  The pelvis is unremarkable. No intrapelvic abnormality is identified. There is mild median lobe hypertrophy of the prostate gland impression on the base the bladder. Extensive atherosclerotic calcifications involving the aorta and iliac vessels but no focal aneurysm. The bony pelvis is intact.  CT RIGHT LOWER EXTREMITY FINDINGS  There is a elongated abscess in the upper right medial thigh. This measures approximately 7.2 x 4.2 x 2.6 cm.  More inferiorly there is a 2nd abscess at the mid femur level medially. This measures 5.3 x 3.1 x 2.8 cm.  More distally in the medial aspect of the leg just above the knee joint is a 3rd abscess measuring 8.9 x 4.6 x 2.0 cm. This is wrapping around the medial aspect of the vastus medialis muscle.  There is significant surrounding cellulitis. No findings for myofasciitis or pyomyositis.  No acute bony findings. No findings to suggest osteomyelitis or septic arthritis. Advanced arterial calcifications are noted.  IMPRESSION: 1. Three separate  abscesses involving the right leg as described above. 2. Diffuse cellulitis but no findings for myofasciitis or pyomyositis. 3. No findings for osteomyelitis or septic arthritis. 4. Advanced atherosclerotic calcifications involving the aorta, iliac and femoral vessels.   Electronically Signed   By: Loralie Champagne M.D.   On: 05/15/2013 13:53    Scheduled Meds: . aspirin EC  81 mg Oral Daily  . atenolol  25 mg Oral Daily  . atorvastatin  40 mg Oral q1800  . dutasteride  0.5 mg Oral Daily  . heparin  5,000 Units Subcutaneous Q8H  . insulin aspart  0-15 Units Subcutaneous TID WC  . levothyroxine  50 mcg Oral QAC breakfast  . multivitamin with minerals  1 tablet Oral Daily  . piperacillin-tazobactam (ZOSYN)  IV  3.375 g Intravenous Q8H  . vancomycin  1,000 mg Intravenous Q12H    Continuous Infusions:   Principal Problem:   Cellulitis Active Problems:   Peripheral vascular disease, unspecified   Acute encephalopathy   CAD (coronary artery disease)   Hypothyroidism   DM (diabetes mellitus)   Lower back pain   Ambulatory dysfunction   BPH (benign prostatic hypertrophy)   Conley Canal  [facility] Pager [phone]. If 7PM-7AM, please contact night-coverage at [email or web address], password [facility] 05/16/2013, 3:39 PM  LOS: 2 days

## 2013-05-16 NOTE — Progress Notes (Signed)
Physical Therapy Treatment Patient Details Name: Harry Brown MRN: [id number] DOB: 23-Jun-1940 Today's Date: 05/16/2013 Time: 4098-1191 PT Time Calculation (min): 17 min  PT Assessment / Plan / Recommendation  History of Present Illness Cellulitis from right femoral popliteal bypass graft with 3 absesses and bedside drainage preformed on 05/15/13.     PT Comments   Pt admitted with above. Pt currently with functional limitations due to the deficits listed below (see PT Problem List). Patient states that he is feeling better, all his testing is showing that he's getting better, and that he want to "get better so that he can go home".   Patient still is unsteady when up on his feet, especially when walking.  Pt will benefit from skilled PT to increase their independence and safety with mobility to allow discharge to the venue listed below.    Follow Up Recommendations  No PT follow up;Supervision - Intermittent           Equipment Recommendations  None recommended by PT       Frequency Min 3X/week      Plan Current plan remains appropriate    Precautions / Restrictions Precautions Precautions: Fall Restrictions Weight Bearing Restrictions: No   Pertinent Vitals/Pain None reported    Mobility  Bed Mobility Bed Mobility: Supine to Sit;Sitting - Scoot to Edge of Bed Supine to Sit: 5: Supervision;HOB flat;With rails Sitting - Scoot to Edge of Bed: 5: Supervision Details for Bed Mobility Assistance: Proper form and sequencing Transfers Transfers: Sit to Stand;Stand to Sit Sit to Stand: 5: Supervision Stand to Sit: 5: Supervision Details for Transfer Assistance: Once standing, pt demonstrated standing balance with minimal swaying.  Pt was unable to slide on slippers in standing due to balance deficits.  Patient was able to move UE and don a second gown while standing. Ambulation/Gait Ambulation/Gait Assistance: 4: Min guard Ambulation Distance (Feet): 300 Feet Assistive  device: None Ambulation/Gait Assistance Details: Gait pattern is improving, through is still unsteady as shown through occasional stumbles when multi-tasking.  His right leg still presents with residual weakness as it drags behind him occasionally. Needs A to manage IV line.  Gait Pattern: Step-through pattern;Decreased stride length Gait velocity: Decreased Stairs: Yes Stairs Assistance: 4: Min guard Stairs Assistance Details (indicate cue type and reason): for safety and managing his IV line Stair Management Technique: Alternating pattern;One rail Right Number of Stairs: 5 Wheelchair Mobility Wheelchair Mobility: No      PT Goals (current goals can now be found in the care plan section) Acute Rehab PT Goals Patient Stated Goal: return home PT Goal Formulation: With patient Time For Goal Achievement: 05/22/13 Potential to Achieve Goals: Good  Visit Information  Last PT Received On: 05/16/13 Assistance Needed: +1 History of Present Illness: Cellulitis from right femoral popliteal bypass graft    Subjective Data  Patient Stated Goal: return home   Cognition  Cognition Arousal/Alertness: Awake/alert Behavior During Therapy: WFL for tasks assessed/performed Overall Cognitive Status: Within Functional Limits for tasks assessed       End of Session PT - End of Session Equipment Utilized During Treatment: Gait belt Activity Tolerance: Patient tolerated treatment well Patient left: in chair;with call bell/phone within reach;with family/visitor present Nurse Communication: Mobility status   GP    Barrie Dunker, SPT Pager:  [phone]  Barrie Dunker 05/16/2013, 3:45 PM

## 2013-05-17 ENCOUNTER — Encounter: Payer: Self-pay | Admitting: *Deleted

## 2013-05-17 DIAGNOSIS — R262 Difficulty in walking, not elsewhere classified: Secondary | ICD-10-CM

## 2013-05-17 LAB — CBC
HCT: 33.4 % — ABNORMAL LOW (ref 39.0–52.0)
Hemoglobin: 11.3 g/dL — ABNORMAL LOW (ref 13.0–17.0)
MCH: 30.3 pg (ref 26.0–34.0)
MCHC: 33.8 g/dL (ref 30.0–36.0)
MCV: 89.5 fL (ref 78.0–100.0)
Platelets: 320 10*3/uL (ref 150–400)
RBC: 3.73 MIL/uL — ABNORMAL LOW (ref 4.22–5.81)
RDW: 13.2 % (ref 11.5–15.5)
WBC: 8.9 10*3/uL (ref 4.0–10.5)

## 2013-05-17 LAB — WOUND CULTURE

## 2013-05-17 LAB — GLUCOSE, CAPILLARY
Glucose-Capillary: 119 mg/dL — ABNORMAL HIGH (ref 70–99)
Glucose-Capillary: 116 mg/dL — ABNORMAL HIGH (ref 70–99)
Glucose-Capillary: 115 mg/dL — ABNORMAL HIGH (ref 70–99)
Glucose-Capillary: 113 mg/dL — ABNORMAL HIGH (ref 70–99)

## 2013-05-17 NOTE — Progress Notes (Signed)
   Daily Progress Note  Assessment/Planning: s/p R CFA to AK pop BPG complicated by infected hematoma   Continue wet-to-dry dressing changes BID  Awaiting C&S to finalize abx  Subjective    Pain better  Objective Filed Vitals:   05/16/13 2042 05/17/13 0559 05/17/13 0944 05/17/13 1302  BP: 118/75 122/80 114/76 138/64  Pulse: 77 73 76 71  Temp: 98.6 F (37 C) 98.3 F (36.8 C)  98.3 F (36.8 C)  TempSrc: Oral Oral  Oral  Resp: 18 20 20 20   Height:      Weight:      SpO2: 94% 95%  96%    Intake/Output Summary (Last 24 hours) at 05/17/13 1357 Last data filed at 05/17/13 0925  Gross per 24 hour  Intake    840 ml  Output      0 ml  Net    840 ml    PULM  CTAB CV  RRR GI  soft, NTND VASC  Both open incisions clean without drainage, some granulation tissue evident, erythema improved, above knee exposure incision c/d/i, groin incision c/d/i  Laboratory CBC    Component Value Date/Time   WBC 8.9 05/17/2013 0655   HGB 11.3* 05/17/2013 0655   HCT 33.4* 05/17/2013 0655   PLT 320 05/17/2013 0655    BMET    Component Value Date/Time   NA 135 05/16/2013 0540   K 4.1 05/16/2013 0540   CL 101 05/16/2013 0540   CO2 23 05/16/2013 0540   GLUCOSE 101* 05/16/2013 0540   BUN 23 05/16/2013 0540   CREATININE 1.01 05/16/2013 0540   CREATININE 0.97 03/20/2013 1639   CALCIUM 8.3* 05/16/2013 0540   GFRNONAA 72* 05/16/2013 0540   GFRAA 83* 05/16/2013 0540    Leonides Sake, MD [facility] [place] Office: [phone] Pager: [phone]  05/17/2013, 1:57 PM

## 2013-05-17 NOTE — Progress Notes (Signed)
[facility] PROGRESS NOTE  JAIMERE FEUTZ WUJ:[id number] DOB: 10/05/1940 DOA: 05/14/2013 PCP: Danise Edge, MD  HPI/Subjective: Reports right thigh has improved with drainage.  Otherwise feels fine.  Assessment/Plan:  Hx of PVD with Right lower extremity cellulitis with 3 abscesses in thigh over recent surgical site. Over Fem/Pop bypass site (surgery 04/25/13) On Vanc/Zosyn. Continue current antibiotics. Wound cultures = abundant gram positive cocci.  Sensitivities are still pending. 3 abscesses found on CT of right pelvis and femur. Vascular following patient.  Status post bedside drainage.  Altered mental status/Acute Encephalopathy  Back at baseline according to wife - resolved. May be secondary to cellulitis.  Urine is negative for infection.  It does not appear that blood cultures were drawn.  Hypotension atenolol and avodart have hold parameters.  Lower back pain  Likely musculoskeletal.  Will provide pain control and Heating pad.   Ambulatory Dysfunction  PT recommends intermittent supervision  BPH with difficulty urinating  Continue avodart   Diabetes mellitus, Type 2   insulin sliding scale and CBG monitoring.  Hold Metformin as we are ordering CT with contrast.  hemoglobin A1c 6.9  Coronary artery disease  Currently patient is chest pain-free.  Will continue home medications of aspirin, atenolol, atorvastatin.   Hyperlipidemia  Will continue atorvastatin   Hypothyroidism  Will continue levothyroxine   DVT Prophylaxis:  heparin  Code Status: full Family Communication: wife at bedside Disposition Plan: home when appropriate.   Consultants:  vascular  Procedures:    Antibiotics:  vanc / zosyn    Objective: Filed Vitals:   05/16/13 2042 05/17/13 0559 05/17/13 0944 05/17/13 1302  BP: 118/75 122/80 114/76 138/64  Pulse: 77 73 76 71  Temp: 98.6 F (37 C) 98.3 F (36.8 C)  98.3 F (36.8 C)  TempSrc: Oral Oral  Oral  Resp: 18  20 20 20   Height:      Weight:      SpO2: 94% 95%  96%    Intake/Output Summary (Last 24 hours) at 05/17/13 1427 Last data filed at 05/17/13 9562  Gross per 24 hour  Intake    840 ml  Output      0 ml  Net    840 ml   Filed Weights   05/14/13 1828  Weight: 99.655 kg (219 lb 11.2 oz)    Exam: General: Well developed, well nourished, NAD, appears stated age  HEENT:  PERR, EOMI, Anicteic Sclera, MMM. No pharyngeal erythema or exudates  Neck: Supple, no JVD, no masses  Cardiovascular: RRR, S1 S2 auscultated, no rubs, murmurs or gallops.   Respiratory: Clear to auscultation bilaterally with equal chest rise  Abdomen: Soft, nontender, nondistended, + bowel sounds  Extremities: warm dry without cyanosis clubbing or edema.  Neuro: AAOx3, cranial nerves grossly intact. Strength 5/5 in upper and lower extremities  Skin: Medial right thigh appears less erythematous, but still indurated.  Bandage is clean and dry. Psych: Normal affect and demeanor with intact judgement and insight   Data Reviewed: Basic Metabolic Panel:  Recent Labs Lab 05/14/13 1933 05/15/13 0528 05/16/13 0540  NA 131* 133* 135  K 4.5 4.2 4.1  CL 98 99 101  CO2 21 26 23   GLUCOSE 126* 125* 101*  BUN 28* 28* 23  CREATININE 0.98 1.02 1.01  CALCIUM 9.0 8.7 8.3*   Liver Function Tests:  Recent Labs Lab 05/14/13 1933  AST 18  ALT 18  ALKPHOS 55  BILITOT 0.9  PROT 7.1  ALBUMIN 3.1*   CBC:  Recent Labs Lab 05/14/13 1933 05/15/13 0528 05/16/13 0540 05/17/13 0655  WBC 23.8* 18.3* 13.4* 8.9  HGB 12.0* 10.9* 10.9* 11.3*  HCT 35.5* 33.0* 33.0* 33.4*  MCV 91.0 90.7 90.9 89.5  PLT 355 330 300 320   Cardiac Enzymes: CBG:  Recent Labs Lab 05/16/13 1216 05/16/13 1703 05/16/13 2159 05/17/13 0755 05/17/13 1141  GLUCAP 124* 92 124* 119* 116*      Studies: No results found.  Scheduled Meds: . aspirin EC  81 mg Oral Daily  . atenolol  25 mg Oral Daily  . atorvastatin  40 mg Oral q1800  .  dutasteride  0.5 mg Oral Daily  . heparin  5,000 Units Subcutaneous Q8H  . insulin aspart  0-15 Units Subcutaneous TID WC  . levothyroxine  50 mcg Oral QAC breakfast  . multivitamin with minerals  1 tablet Oral Daily  . piperacillin-tazobactam (ZOSYN)  IV  3.375 g Intravenous Q8H  . vancomycin  1,000 mg Intravenous Q12H   Continuous Infusions:   Principal Problem:   Cellulitis Active Problems:   CAD (coronary artery disease)   Hypothyroidism   DM (diabetes mellitus)   Peripheral vascular disease, unspecified   Acute encephalopathy   Lower back pain   Ambulatory dysfunction   BPH (benign prostatic hypertrophy)   Clydia Llano A, PA-C  [facility] Pager [phone]. If 7PM-7AM, please contact night-coverage at [email or web address], password [facility] 05/17/2013, 2:27 PM  LOS: 3 days

## 2013-05-18 ENCOUNTER — Telehealth: Payer: Self-pay | Admitting: Vascular Surgery

## 2013-05-18 LAB — WOUND CULTURE

## 2013-05-18 LAB — GLUCOSE, CAPILLARY
Glucose-Capillary: 111 mg/dL — ABNORMAL HIGH (ref 70–99)
Glucose-Capillary: 113 mg/dL — ABNORMAL HIGH (ref 70–99)

## 2013-05-18 MED ORDER — OXYCODONE HCL 5 MG PO TABS: 5.0000 mg | ORAL_TABLET | Freq: Four times a day (QID) | ORAL | Status: DC | PRN

## 2013-05-18 MED ORDER — LEVOFLOXACIN 750 MG PO TABS: 750.0000 mg | ORAL_TABLET | Freq: Every day | ORAL | Status: DC

## 2013-05-18 MED ORDER — CEPHALEXIN 500 MG PO CAPS: 500.0000 mg | ORAL_CAPSULE | Freq: Four times a day (QID) | ORAL | Status: DC

## 2013-05-18 NOTE — Progress Notes (Signed)
ANTIBIOTIC CONSULT NOTE   Pharmacy Consult for Vancomycin and Zosyn Indication: cellulitis in right leg  Allergies  Allergen Reactions  . Other Hives    Plastic shopping bags    Patient Measurements: Height: 6' (182.9 cm) Weight: 219 lb 11.2 oz (99.655 kg) IBW/kg (Calculated) : 77.6 Adjusted Body Weight:   Vital Signs: Temp: 98.1 F (36.7 C) (12/12 0556) Temp src: Oral (12/12 0556) BP: 128/83 mmHg (12/12 0556) Pulse Rate: 67 (12/12 0556) Intake/Output from previous day: 12/11 0701 - 12/12 0700 In: 540 [P.O.:240; IV Piggyback:300] Out: -  Intake/Output from this shift: Total I/O In: 240 [P.O.:240] Out: -   Labs:  Recent Labs  05/16/13 0540 05/17/13 0655  WBC 13.4* 8.9  HGB 10.9* 11.3*  PLT 300 320  CREATININE 1.01  --    Estimated Creatinine Clearance: 79.6 ml/min (by C-G formula based on Cr of 1.01). No results found for this basename: VANCOTROUGH, Leodis Binet, VANCORANDOM, GENTTROUGH, GENTPEAK, GENTRANDOM, TOBRATROUGH, TOBRAPEAK, TOBRARND, AMIKACINPEAK, AMIKACINTROU, AMIKACIN,  in the last 72 hours   Microbiology: Recent Results (from the past 720 hour(s))  SURGICAL PCR SCREEN     Status: None   Collection Time    04/20/13  8:23 AM      Result Value Range Status   MRSA, PCR NEGATIVE  NEGATIVE Final   Staphylococcus aureus NEGATIVE  NEGATIVE Final   Comment:            The Xpert SA Assay (FDA     approved for NASAL specimens     in patients over 14 years of age),     is one component of     a comprehensive surveillance     program.  Test performance has     been validated by [employer] for patients greater     than or equal to 42 year old.     It is not intended     to diagnose infection nor to     guide or monitor treatment.  URINE CULTURE     Status: None   Collection Time    05/14/13  9:00 PM      Result Value Range Status   Specimen Description URINE, RANDOM   Final   Special Requests NONE   Final   Culture  Setup Time     Final   Value: 05/15/2013 04:27     Performed at [facility]   Colony Count     Final   Value: NO GROWTH     Performed at [facility]   Culture     Final   Value: NO GROWTH     Performed at [facility]   Report Status 05/16/2013 FINAL   Final  WOUND CULTURE     Status: None   Collection Time    05/15/13 12:42 PM      Result Value Range Status   Specimen Description WOUND LEG LEFT   Final   Special Requests NONE   Final   Gram Stain     Final   Value: MODERATE WBC PRESENT,BOTH PMN AND MONONUCLEAR     NO SQUAMOUS EPITHELIAL CELLS SEEN     RARE GRAM POSITIVE COCCI     IN PAIRS     Performed at [facility]   Culture     Final   Value: FEW STAPHYLOCOCCUS SPECIES (COAGULASE NEGATIVE)     Performed at [facility]   Report Status 05/17/2013 FINAL  Final  WOUND CULTURE     Status: None   Collection Time    05/15/13  4:59 PM      Result Value Range Status   Specimen Description WOUND RIGHT LEG   Final   Special Requests PATIENT ON FOLLOWING VANCOMYCIN    Final   Gram Stain     Final   Value: ABUNDANT WBNCPM NO SQUAMOUS EPITHELIAL CELLS SEEN     ABUNDANT GRAM POSITIVE COCCI     IN PAIRS IN CLUSTERS     Performed at [facility]   Culture     Final   Value: ABUNDANT STAPHYLOCOCCUS AUREUS     Note: RIFAMPIN AND GENTAMICIN SHOULD NOT BE USED AS SINGLE DRUGS FOR TREATMENT OF STAPH INFECTIONS.     Performed at [facility]   Report Status 05/18/2013 FINAL   Final   Organism ID, Bacteria STAPHYLOCOCCUS AUREUS   Final    Medical History: Past Medical History  Diagnosis Date  . Diverticulitis   . CAD (coronary artery disease)   . Hypothyroidism   . ED (erectile dysfunction)   . Other and unspecified hyperlipidemia   . Neck pain on left side 11/22/2012  . Urinary urgency 11/22/2012  . Dizziness   . Hypertension   . Myocardial infarction 11/05/1985  . History of sleep apnea     had surgery to correct  . Type II diabetes mellitus      fasting 100-120  . Peripheral vascular disease   . Cellulitis 05/14/2013    RLE    Medications:  Scheduled:  . aspirin EC  81 mg Oral Daily  . atenolol  25 mg Oral Daily  . atorvastatin  40 mg Oral q1800  . dutasteride  0.5 mg Oral Daily  . heparin  5,000 Units Subcutaneous Q8H  . insulin aspart  0-15 Units Subcutaneous TID WC  . levothyroxine  50 mcg Oral QAC breakfast  . multivitamin with minerals  1 tablet Oral Daily  . piperacillin-tazobactam (ZOSYN)  IV  3.375 g Intravenous Q8H  . vancomycin  1,000 mg Intravenous Q12H   Assessment: 72 yr old male came from MD office as a direct admit. He underwent fem-pop bypass on 11/19. His cellulitis and abscesses have been treated with vanc and zosyn.  Wound cultures are positive for MSSA and coag neg staph.  Goal of Therapy:  Vancomycin trough level 10-15 mcg/ml  Plan:  Consider changing to po Keflex at discharge.   Harry Brown 05/18/2013,10:14 AM

## 2013-05-18 NOTE — Progress Notes (Signed)
[facility] [place]  Subjective  - Discharge home   Objective 128/83 67 98.1 F (36.7 C) (Oral) 18 96%  Intake/Output Summary (Last 24 hours) at 05/18/13 1041 Last data filed at 05/18/13 0942  Gross per 24 hour  Intake    540 ml  Output      0 ml  Net    540 ml    Dressing clean and dry Changed this am by nursing. Surrounding erythema is minimal and decreased  Groin incision healing well without erythema or drainage.   Assessment/Planning: s/p R CFA to AK pop BPG complicated by infected hematoma  STAPHYLOCOCCUS AUREUS Pharmacy suggested Keflex PO at discharge F/U with Dr. Imogene Burn in 1 week in the office    Thomasena Edis [facility] 05/18/2013 10:41 AM --  Laboratory Lab Results:  Recent Labs  05/16/13 0540 05/17/13 0655  WBC 13.4* 8.9  HGB 10.9* 11.3*  HCT 33.0* 33.4*  PLT 300 320   BMET  Recent Labs  05/16/13 0540  NA 135  K 4.1  CL 101  CO2 23  GLUCOSE 101*  BUN 23  CREATININE 1.01  CALCIUM 8.3*    COAG Lab Results  Component Value Date   INR 0.99 04/20/2013   No results found for this basename: PTT

## 2013-05-18 NOTE — Discharge Summary (Signed)
Addendum  Patient seen and examined, chart and data base reviewed.  I agree with the above assessment and plan.  For full details please see Mrs. Algis Downs PA note.  Right lower extremity cellulitis, infected hematoma with MSSA.  Status post recent femoral-popliteal bypass on 04/25/13.   Initially wants discharged on Levaquin but vascular surgery recommended Keflex.   Clint Lipps, MD [facility] Pager: [phone] 05/18/2013, 12:23 PM

## 2013-05-18 NOTE — Progress Notes (Signed)
Harry Brown to be D/C'd Home with [facility] per MD order.  Discussed with the patient and all questions fully answered.    Medication List         ALAVERT 10 MG tablet  Generic drug:  loratadine  Take 10 mg by mouth daily as needed for allergies.     aspirin EC 81 MG tablet  Take 81 mg by mouth at bedtime.     atenolol 25 MG tablet  Commonly known as:  TENORMIN  Take 25 mg by mouth daily.     atorvastatin 40 MG tablet  Commonly known as:  LIPITOR  Take 40 mg by mouth daily.     cephALEXin 500 MG capsule  Commonly known as:  KEFLEX  Take 1 capsule (500 mg total) by mouth 4 (four) times daily.     dutasteride 0.5 MG capsule  Commonly known as:  AVODART  Take 0.5 mg by mouth at bedtime.     levothyroxine 50 MCG tablet  Commonly known as:  SYNTHROID, LEVOTHROID  Take 50 mcg by mouth daily before breakfast.     metFORMIN 500 MG 24 hr tablet  Commonly known as:  GLUCOPHAGE-XR  Take 1,000 mg by mouth daily with breakfast.     multivitamin with minerals Tabs tablet  Take 1 tablet by mouth daily.     oxyCODONE 5 MG immediate release tablet  Commonly known as:  Oxy IR/ROXICODONE  Take 1 tablet (5 mg total) by mouth every 6 (six) hours as needed for severe pain.        VVS, Skin clean, dry and intact without evidence of skin break down, no evidence of skin tears noted. IV catheter discontinued intact. Site without signs and symptoms of complications. Dressing and pressure applied.  An After Visit Summary was printed and given to the patient. Follow up appointments , new prescriptions and medication administration times given. handouts given cellulitis and abcesses and discussed. All questions answered. Patient escorted via WC, and D/C home via private auto.  Cindra Eves, RN 05/18/2013 12:59 PM

## 2013-05-18 NOTE — Discharge Summary (Signed)
Physician Discharge Summary  Harry Brown:[id number] DOB: 06-Jul-1940 DOA: 05/14/2013  PCP: Danise Edge, MD  Admit date: 05/14/2013 Discharge date: 05/18/2013  Time spent: 50 minutes  Recommendations for Outpatient Follow-up:  1. Follow up with Dr. Imogene Burn each Friday. 2. HHRN to educate family regarding dressing changes and observe wound.  Discharge Diagnoses:  Principal Problem:   Cellulitis Active Problems:   Peripheral vascular disease, unspecified   Acute encephalopathy   CAD (coronary artery disease)   Hypothyroidism   DM (diabetes mellitus)   Lower back pain   Ambulatory dysfunction   BPH (benign prostatic hypertrophy)   Discharge Condition: stable  Diet recommendation: Heart Healthy  Filed Weights   05/14/13 1828  Weight: 99.655 kg (219 lb 11.2 oz)    History of present illness at the time of admission:   Harry Brown is a 72 y.o. male with a history of diabetes, hypothyroidism, coronary disease that recently underwent femoral-popliteal bypass of the right lower extremity with Dr. Imogene Burn on 04/25/2013. Patient presented to his primary care physician on the day of admission for right leg pain. He was noted to have a temperature 101.5 at home.  Patient was sent to [facility] as a direct admission for cellulitis. Per patient's wife, he was some what altered yesterday, however is back at his baseline.    Hospital Course:  Hx of PVD with Right lower extremity cellulitis, infected hematoma,  and seromas in thigh over recent surgical site.  Over Fem/Pop bypass site (surgery 04/25/13)  Was placed on IV vancomycin and zosyn.  Received IV antibiotics for 5 days. Wound cultures = abundant gram positive cocci. Resistant to penicillin, but sensitive to Levaquin. CT of the right thigh revealed 3 abscesses that spanned the length of his right thigh.   Vascular following patient. Status post bedside drainage.  Erythema and induration have significantly decreased. Mr.  Villarruel will be discharged with an additional 10 days of Levaquin.    Altered mental status/Acute Encephalopathy  Back at baseline according to wife - resolved.  Likely secondary to cellulitis.  Urine is negative for infection. It does not appear that blood cultures were drawn.   Hypotension  Atenolol and avodart were initially held due to hypotension, but have since been resumed, and his blood pressure is stable.  Lower back pain  Likely musculoskeletal.  Was treated with Tylenol and a heating pad inpatient.  Ambulatory Dysfunction  PT recommends intermittent supervision.  No PT home health services were felt to be needed.  BPH with difficulty urinating  Continue avodart   Diabetes mellitus, Type 2  Hemoglobin A1c 6.9  Insulin sliding scale and CBG monitoring.  Metformin was held inpatient, but will be resumed at discharge.   Coronary artery disease  Currently patient is chest pain-free.  Will continue home medications of aspirin, atenolol, atorvastatin.   Hyperlipidemia  Will continue atorvastatin   Hypothyroidism  Will continue levothyroxine   Procedures:  Bedside drainage of infected hematoma and seroma.  Consultations:  Vascular Surgery  Discharge Exam: Filed Vitals:   05/18/13 0556  BP: 128/83  Pulse: 67  Temp: 98.1 F (36.7 C)  Resp: 18   General: Well developed, well nourished, NAD, appears stated age, pleasant HEENT: PERR, EOMI, Anicteic Sclera, MMM. No pharyngeal erythema or exudates  Neck: Supple, no JVD, no masses  Cardiovascular: RRR, S1 S2 auscultated, no rubs, murmurs or gallops.  Respiratory: Clear to auscultation bilaterally with equal chest rise  Abdomen: Soft, nontender, nondistended, + bowel sounds  Extremities: warm dry without cyanosis clubbing or edema.  Neuro: AAOx3, cranial nerves grossly intact. Strength 5/5 in upper and lower extremities  Skin: Medial right thigh appears less erythematous, but still indurated. Bandage is clean  and dry.  Psych: Normal affect and demeanor with intact judgement and insight    Discharge Instructions      Discharge Orders   Future Appointments Provider Department Dept Phone   05/25/2013 10:45 AM Fransisco Hertz, MD [facility] [phone]   05/28/2013 1:30 PM Myeong Eyvonne [facility] [phone]   07/30/2013 1:00 PM Bradd Canary, MD [facility] [phone]   Future Orders Complete By Expires   Diet - low sodium heart healthy  As directed    Increase activity slowly  As directed        Medication List         ALAVERT 10 MG tablet  Generic drug:  loratadine  Take 10 mg by mouth daily as needed for allergies.     aspirin EC 81 MG tablet  Take 81 mg by mouth at bedtime.     atenolol 25 MG tablet  Commonly known as:  TENORMIN  Take 25 mg by mouth daily.     atorvastatin 40 MG tablet  Commonly known as:  LIPITOR  Take 40 mg by mouth daily.     cephALEXin 500 MG capsule  Commonly known as:  KEFLEX  Take 1 capsule (500 mg total) by mouth 4 (four) times daily.     dutasteride 0.5 MG capsule  Commonly known as:  AVODART  Take 0.5 mg by mouth at bedtime.     levothyroxine 50 MCG tablet  Commonly known as:  SYNTHROID, LEVOTHROID  Take 50 mcg by mouth daily before breakfast.     metFORMIN 500 MG 24 hr tablet  Commonly known as:  GLUCOPHAGE-XR  Take 1,000 mg by mouth daily with breakfast.     multivitamin with minerals Tabs tablet  Take 1 tablet by mouth daily.     oxyCODONE 5 MG immediate release tablet  Commonly known as:  Oxy IR/ROXICODONE  Take 1 tablet (5 mg total) by mouth every 6 (six) hours as needed for severe pain.       Allergies  Allergen Reactions  . Other Hives    Plastic shopping bags   Follow-up Information   Follow up with Nilda Simmer, MD In 1 week. (sent)    Specialty:  Vascular Surgery   Contact information:   [address] [place] [address] [phone]        Follow up with Danise Edge, MD. Schedule an appointment as soon as possible for a visit in 1 week.   Specialty:  Family Medicine   Contact information:   [address] [place] [address] [phone]        The results of significant diagnostics from this hospitalization (including imaging, microbiology, ancillary and laboratory) are listed below for reference.    Significant Diagnostic Studies: Dg Chest 2 View  04/20/2013   CLINICAL DATA:  History of tobacco use, coronary artery disease  EXAM: CHEST  2 VIEW  COMPARISON:  None.  FINDINGS: The heart and pulmonary vascularity are within normal limits. Postsurgical changes are noted. The lungs are clear bilaterally and mildly hyperinflated. No acute bony abnormality is seen.  IMPRESSION: No acute abnormality noted.   Electronically Signed   By: Alcide Clever M.D.   On: 04/20/2013 11:51  Ct Pelvis W Contrast  05/15/2013   CLINICAL DATA:  Right lower extremity cellulitis.  EXAM: CT PELVIS AND RIGHT LOWER EXTREMITY WITH CONTRAST  TECHNIQUE: Multidetector CT imaging of the pelvis and right lower extremity was performed according to the standard protocol following bolus administration of intravenous contrast. Multiplanar CT image reconstructions were also generated.  CONTRAST:  OMNIPAQUE IOHEXOL 300 MG/ML  SOLN  COMPARISON:  None.  FINDINGS: CT PELVIS FINDINGS  The pelvis is unremarkable. No intrapelvic abnormality is identified. There is mild median lobe hypertrophy of the prostate gland impression on the base the bladder. Extensive atherosclerotic calcifications involving the aorta and iliac vessels but no focal aneurysm. The bony pelvis is intact.  CT RIGHT LOWER EXTREMITY FINDINGS  There is a elongated abscess in the upper right medial thigh. This measures approximately 7.2 x 4.2 x 2.6 cm.  More inferiorly there is a 2nd abscess at the mid femur level medially. This measures 5.3 x 3.1 x 2.8 cm.  More distally in the medial  aspect of the leg just above the knee joint is a 3rd abscess measuring 8.9 x 4.6 x 2.0 cm. This is wrapping around the medial aspect of the vastus medialis muscle.  There is significant surrounding cellulitis. No findings for myofasciitis or pyomyositis.  No acute bony findings. No findings to suggest osteomyelitis or septic arthritis. Advanced arterial calcifications are noted.  IMPRESSION: 1. Three separate abscesses involving the right leg as described above. 2. Diffuse cellulitis but no findings for myofasciitis or pyomyositis. 3. No findings for osteomyelitis or septic arthritis. 4. Advanced atherosclerotic calcifications involving the aorta, iliac and femoral vessels.   Electronically Signed   By: Loralie Champagne M.D.   On: 05/15/2013 13:53   Ct Femur Right W Contrast  05/15/2013   CLINICAL DATA:  Right lower extremity cellulitis.  EXAM: CT PELVIS AND RIGHT LOWER EXTREMITY WITH CONTRAST  TECHNIQUE: Multidetector CT imaging of the pelvis and right lower extremity was performed according to the standard protocol following bolus administration of intravenous contrast. Multiplanar CT image reconstructions were also generated.  CONTRAST:  OMNIPAQUE IOHEXOL 300 MG/ML  SOLN  COMPARISON:  None.  FINDINGS: CT PELVIS FINDINGS  The pelvis is unremarkable. No intrapelvic abnormality is identified. There is mild median lobe hypertrophy of the prostate gland impression on the base the bladder. Extensive atherosclerotic calcifications involving the aorta and iliac vessels but no focal aneurysm. The bony pelvis is intact.  CT RIGHT LOWER EXTREMITY FINDINGS  There is a elongated abscess in the upper right medial thigh. This measures approximately 7.2 x 4.2 x 2.6 cm.  More inferiorly there is a 2nd abscess at the mid femur level medially. This measures 5.3 x 3.1 x 2.8 cm.  More distally in the medial aspect of the leg just above the knee joint is a 3rd abscess measuring 8.9 x 4.6 x 2.0 cm. This is wrapping around the  medial aspect of the vastus medialis muscle.  There is significant surrounding cellulitis. No findings for myofasciitis or pyomyositis.  No acute bony findings. No findings to suggest osteomyelitis or septic arthritis. Advanced arterial calcifications are noted.  IMPRESSION: 1. Three separate abscesses involving the right leg as described above. 2. Diffuse cellulitis but no findings for myofasciitis or pyomyositis. 3. No findings for osteomyelitis or septic arthritis. 4. Advanced atherosclerotic calcifications involving the aorta, iliac and femoral vessels.   Electronically Signed   By: Loralie Champagne M.D.   On: 05/15/2013 13:53    Microbiology: Recent  Results (from the past 240 hour(s))  URINE CULTURE     Status: None   Collection Time    05/14/13  9:00 PM      Result Value Range Status   Specimen Description URINE, RANDOM   Final   Special Requests NONE   Final   Culture  Setup Time     Final   Value: 05/15/2013 04:27     Performed at [facility] Count     Final   Value: NO GROWTH     Performed at [facility]   Culture     Final   Value: NO GROWTH     Performed at [facility]   Report Status 05/16/2013 FINAL   Final  WOUND CULTURE     Status: None   Collection Time    05/15/13 12:42 PM      Result Value Range Status   Specimen Description WOUND LEG LEFT   Final   Special Requests NONE   Final   Gram Stain     Final   Value: MODERATE WBC PRESENT,BOTH PMN AND MONONUCLEAR     NO SQUAMOUS EPITHELIAL CELLS SEEN     RARE GRAM POSITIVE COCCI     IN PAIRS     Performed at [facility]   Culture     Final   Value: FEW STAPHYLOCOCCUS SPECIES (COAGULASE NEGATIVE)     Performed at [facility]   Report Status 05/17/2013 FINAL   Final  WOUND CULTURE     Status: None   Collection Time    05/15/13  4:59 PM      Result Value Range Status   Specimen Description WOUND RIGHT LEG   Final   Special Requests PATIENT ON FOLLOWING VANCOMYCIN     Final   Gram Stain     Final   Value: ABUNDANT WBNCPM NO SQUAMOUS EPITHELIAL CELLS SEEN     ABUNDANT GRAM POSITIVE COCCI     IN PAIRS IN CLUSTERS     Performed at [facility]   Culture     Final   Value: ABUNDANT STAPHYLOCOCCUS AUREUS     Note: RIFAMPIN AND GENTAMICIN SHOULD NOT BE USED AS SINGLE DRUGS FOR TREATMENT OF STAPH INFECTIONS.     Performed at [facility]   Report Status 05/18/2013 FINAL   Final   Organism ID, Bacteria STAPHYLOCOCCUS AUREUS   Final     Labs: Basic Metabolic Panel:  Recent Labs Lab 05/14/13 1933 05/15/13 0528 05/16/13 0540  NA 131* 133* 135  K 4.5 4.2 4.1  CL 98 99 101  CO2 21 26 23   GLUCOSE 126* 125* 101*  BUN 28* 28* 23  CREATININE 0.98 1.02 1.01  CALCIUM 9.0 8.7 8.3*   Liver Function Tests:  Recent Labs Lab 05/14/13 1933  AST 18  ALT 18  ALKPHOS 55  BILITOT 0.9  PROT 7.1  ALBUMIN 3.1*   CBC:  Recent Labs Lab 05/14/13 1933 05/15/13 0528 05/16/13 0540 05/17/13 0655  WBC 23.8* 18.3* 13.4* 8.9  HGB 12.0* 10.9* 10.9* 11.3*  HCT 35.5* 33.0* 33.0* 33.4*  MCV 91.0 90.7 90.9 89.5  PLT 355 330 300 320   CBG:  Recent Labs Lab 05/17/13 0755 05/17/13 1141 05/17/13 1648 05/17/13 2100 05/18/13 0801  GLUCAP 119* 116* 115* 113* 111*       Signed:  Conley Canal [phone]  [facility] 05/18/2013, 11:19 AM

## 2013-05-18 NOTE — Telephone Encounter (Addendum)
Message copied by Fredrich Birks on Fri May 18, 2013 11:33 AM ------      Message from: [place], [place] M      Created: Fri May 18, 2013 10:39 AM       F/U with Dr. Imogene Burn in 1 week s/p fem-pop with vein harvest site infection. ------  05/18/13: pt already scheduled, dpm

## 2013-05-24 ENCOUNTER — Encounter: Payer: Self-pay | Admitting: Vascular Surgery

## 2013-05-25 ENCOUNTER — Encounter: Payer: Self-pay | Admitting: Vascular Surgery

## 2013-05-25 ENCOUNTER — Ambulatory Visit (INDEPENDENT_AMBULATORY_CARE_PROVIDER_SITE_OTHER): Payer: Self-pay | Admitting: Vascular Surgery

## 2013-05-25 VITALS — BP 135/76 | HR 99 | Temp 98.2°F | Ht 72.0 in | Wt 217.0 lb

## 2013-05-25 DIAGNOSIS — I70219 Atherosclerosis of native arteries of extremities with intermittent claudication, unspecified extremity: Secondary | ICD-10-CM

## 2013-05-25 NOTE — Progress Notes (Signed)
[facility] [place]  Postoperative Visit  History of Present Illness  Harry Brown is a 72 y.o. year old male who presents for postoperative follow-up for: R CFA to AK pop BPG w/ NR ips GSV (Date: 04/25/13).  The patient was admitted for cellulitis.  While admitted, his hematoma and seroma decompressed.  The patient's wounds have been undergoing wet-to-dry dressing BID.  The patient notes resolution of lower extremity symptoms.  The patient is able to complete their activities of daily living.  The patient's current symptoms are: continued drainage. Pt is finishing out his Keflex for MSSA.  For VQI Use Only  PRE-ADM LIVING: Home  AMB STATUS: Ambulatory  Physical Examination  Filed Vitals:   05/25/13 1035  BP: 135/76  Pulse: 99  Temp: 98.2 F (36.8 C)   RLE: Incisions are healing, erythema improved,  No pus or odor, open wound with some granulation evident, warm R foot Medical Decision Making  Harry Brown is a 72 y.o. year old male who presents s/p R CFA to AK pop BPG w/ ips GSV complicated with GSV harvest incision infection and cellulits   Continue wet-to-dry dressing BID.  Continue abx until completed.  F/u 2-3 weeks for wound check  Leonides Sake, MD [facility] [place] Office: [phone] Pager: [phone]  05/25/2013, 10:57 AM

## 2013-05-26 ENCOUNTER — Other Ambulatory Visit: Payer: Self-pay | Admitting: Family Medicine

## 2013-05-28 ENCOUNTER — Encounter: Payer: Self-pay | Admitting: Podiatry

## 2013-05-28 ENCOUNTER — Ambulatory Visit (INDEPENDENT_AMBULATORY_CARE_PROVIDER_SITE_OTHER): Payer: Medicare Other | Admitting: Podiatry

## 2013-05-28 VITALS — BP 128/80 | HR 81 | Ht 72.0 in | Wt 220.0 lb

## 2013-05-28 DIAGNOSIS — M79609 Pain in unspecified limb: Secondary | ICD-10-CM

## 2013-05-28 DIAGNOSIS — I739 Peripheral vascular disease, unspecified: Secondary | ICD-10-CM

## 2013-05-28 DIAGNOSIS — B351 Tinea unguium: Secondary | ICD-10-CM

## 2013-05-28 NOTE — Progress Notes (Signed)
Subjective:  Had bypass surgery in November and readmitted due to infection. Still having wound care provided by home health care.  Today he is here for routine foot care. Blood sugar was 107 this morning.   Objective:  Pedal pulses are still not palpable bilateral DP and PT.  Thick yellow fungal nails on both great toes.  No open lesions noted.  No trophic changes seen.   Assessment: PVD, NIDDM  Onychomycosis.   Plan: Debrided all nails.

## 2013-05-28 NOTE — Telephone Encounter (Signed)
Rx request to pharmacy/SLS  

## 2013-05-28 NOTE — Patient Instructions (Signed)
Seen for hypertrophic nails. All nails debrided. Return in 3 months.  

## 2013-05-29 ENCOUNTER — Encounter: Payer: Self-pay | Admitting: *Deleted

## 2013-06-11 ENCOUNTER — Telehealth: Payer: Self-pay | Admitting: Family Medicine

## 2013-06-11 ENCOUNTER — Telehealth: Payer: Self-pay | Admitting: *Deleted

## 2013-06-11 MED ORDER — LEVOTHYROXINE SODIUM 50 MCG PO TABS: 50.0000 ug | ORAL_TABLET | Freq: Every day | ORAL | Status: DC

## 2013-06-11 MED ORDER — ATORVASTATIN CALCIUM 40 MG PO TABS: ORAL_TABLET | ORAL | Status: DC

## 2013-06-11 MED ORDER — FREESTYLE LANCETS MISC: Status: DC

## 2013-06-11 MED ORDER — GLUCOSE BLOOD VI STRP: ORAL_STRIP | Status: DC

## 2013-06-11 NOTE — Telephone Encounter (Signed)
Received call from pt's wife stating they are changing pharmacies to [facility]. Pt has no refills remaining on: Atorvastatin 40mg  and levothyroxine 34mcg and needs freestyle lancets (durasense) and freestyle lite test strips. Checks BS once a day.  Refills sent.

## 2013-06-11 NOTE — Telephone Encounter (Signed)
Patient changing all rs from [facility] to [facility].  Needs new rx for Levothyroxin 50 mg and Atorvastatin 40 mg

## 2013-06-12 MED ORDER — ATORVASTATIN CALCIUM 40 MG PO TABS: ORAL_TABLET | ORAL | Status: DC

## 2013-06-12 MED ORDER — LEVOTHYROXINE SODIUM 50 MCG PO TABS: 50.0000 ug | ORAL_TABLET | Freq: Every day | ORAL | Status: DC

## 2013-06-14 ENCOUNTER — Encounter: Payer: Self-pay | Admitting: Vascular Surgery

## 2013-06-15 ENCOUNTER — Encounter: Payer: Self-pay | Admitting: Vascular Surgery

## 2013-06-15 ENCOUNTER — Ambulatory Visit (INDEPENDENT_AMBULATORY_CARE_PROVIDER_SITE_OTHER): Payer: Self-pay | Admitting: Vascular Surgery

## 2013-06-15 VITALS — BP 137/73 | HR 72 | Ht 72.0 in | Wt 223.0 lb

## 2013-06-15 DIAGNOSIS — I739 Peripheral vascular disease, unspecified: Secondary | ICD-10-CM

## 2013-06-15 NOTE — Progress Notes (Signed)
[facility] [place]   Postoperative Visit   History of Present Illness  Harry Brown is a 73 y.o. year old male who presents for postoperative follow-up for: R CFA to AK pop BPG w/ NR ips GSV (Date: 04/25/13).  The patient's wounds have been undergoing wet-to-dry dressing BID. The lower opening has essentially closed with some limited drainage.  The upper opening is still undergoing packing with iodoform.  The patient notes resolution of lower extremity symptoms. The patient is able to complete their activities of daily living. The patient's current symptoms are: continued drainage.   Physical Examination  Filed Vitals:   06/15/13 1023  BP: 137/73  Pulse: 72  Height: 6' (1.829 m)  Weight: 223 lb (101.152 kg)  SpO2: 100%    RLE: Lower incision nearly healed, groin incision healed, mid-thigh (vein harvest incision nearly healed) with minimal packing present, good granulation, no pus or odor, warm R foot   Medical Decision Making  Harry Brown is a 73 y.o. year old male who presents s/p R CFA to AK pop BPG w/ ips GSV complicated with GSV harvest incision infection and cellulits   - continue BID wet-to-dry dressings - Follow up in 2 weeks  Adele Barthel, MD [facility] [place] Office: [phone] Pager: [phone]  06/15/2013, 10:27 AM

## 2013-06-28 ENCOUNTER — Encounter: Payer: Self-pay | Admitting: Vascular Surgery

## 2013-06-29 ENCOUNTER — Encounter: Payer: Self-pay | Admitting: Vascular Surgery

## 2013-06-29 ENCOUNTER — Ambulatory Visit (INDEPENDENT_AMBULATORY_CARE_PROVIDER_SITE_OTHER): Payer: Self-pay | Admitting: Vascular Surgery

## 2013-06-29 VITALS — BP 148/80 | HR 71 | Ht 72.0 in | Wt 222.4 lb

## 2013-06-29 DIAGNOSIS — I70219 Atherosclerosis of native arteries of extremities with intermittent claudication, unspecified extremity: Secondary | ICD-10-CM

## 2013-06-29 DIAGNOSIS — I739 Peripheral vascular disease, unspecified: Secondary | ICD-10-CM

## 2013-06-29 NOTE — Progress Notes (Signed)
[facility] [place]   Postoperative Visit  History of Present Illness   Harry Brown is a 73 y.o. year old male who presents for postoperative follow-up for: R CFA to AK pop BPG w/ NR ips GSV (Date: 04/25/13). The patient's wounds have been undergoing wet-to-dry dressing BID. The only remaining opening is the lower one which is nearly gone.  The patient notes resolution of lower extremity symptoms. The patient is able to complete their activities of daily living. The patient's current symptoms are: none.  Physical Examination  Filed Vitals:   06/29/13 0859  BP: 148/80  Pulse: 71  Height: 6' (1.829 m)  Weight: 222 lb 6.4 oz (100.88 kg)  SpO2: 100%   RLE: Right groin healed, R vein harvest incision healed except for a <1 cm opening in mid-GSV harvest incision, good granulation, no drainage, no smell  Medical Decision Making  Harry Brown is a 73 y.o. year old male who presents s/p R CFA to AK pop BPG w/ ips GSV complicated with GSV harvest incision infection and cellulits  - continue BID wet-to-dry dressings  - Follow up in 2 weeks   Adele Barthel, MD [facility] [place] Office: [phone] Pager: [phone]  06/29/2013, 9:34 AM

## 2013-06-29 NOTE — Addendum Note (Signed)
Addended by: Mena Goes on: 06/29/2013 01:03 PM   Modules accepted: Orders

## 2013-07-12 ENCOUNTER — Other Ambulatory Visit: Payer: Self-pay | Admitting: Family Medicine

## 2013-07-12 ENCOUNTER — Encounter: Payer: Self-pay | Admitting: Vascular Surgery

## 2013-07-13 ENCOUNTER — Encounter: Payer: Self-pay | Admitting: Vascular Surgery

## 2013-07-13 ENCOUNTER — Ambulatory Visit (INDEPENDENT_AMBULATORY_CARE_PROVIDER_SITE_OTHER): Payer: Self-pay | Admitting: Vascular Surgery

## 2013-07-13 VITALS — BP 141/85 | HR 68 | Ht 72.0 in | Wt 224.7 lb

## 2013-07-13 DIAGNOSIS — I70219 Atherosclerosis of native arteries of extremities with intermittent claudication, unspecified extremity: Secondary | ICD-10-CM

## 2013-07-13 DIAGNOSIS — Z48812 Encounter for surgical aftercare following surgery on the circulatory system: Secondary | ICD-10-CM

## 2013-07-13 NOTE — Addendum Note (Signed)
Addended by: Dorthula Rue L on: 07/13/2013 04:01 PM   Modules accepted: Orders

## 2013-07-13 NOTE — Progress Notes (Signed)
    Postoperative Visit   History of Present Illness  Harry Brown is a 73 y.o. year old male who presents for postoperative follow-up for: R CFA to AK pop BPG w/ NR ips GSV (Date: 11/19/1).  The patient's wounds are healed.  The patient notes improvement in lower extremity symptoms.  The patient is able to complete their activities of daily living.  The patient's current symptoms are: none.  For VQI Use Only  PRE-ADM LIVING: Home  AMB STATUS: Ambulatory  Physical Examination  Filed Vitals:   07/13/13 1051  BP: 141/85  Pulse: 68   RLE: Incisions are healed, wounds are healed, Rt DP and PT faintly palpable  Medical Decision Making  Harry Brown is a 73 y.o. year old male who presents s/p L fem to AK pop bypass with NR GSV.  The patient's bypass incisions are healed appropriately with resolution of pre-operative symptoms. I discussed in depth with the patient the nature of atherosclerosis, and emphasized the importance of maximal medical management including strict control of blood pressure, blood glucose, and lipid levels, obtaining regular exercise, and cessation of smoking.  The patient is aware that without maximal medical management the underlying atherosclerotic disease process will progress, limiting the benefit of any interventions. The patient's surveillance will included ABI and bypass duplex studies which will be completed in: 3 months, at which time the patient will be re-evaluated.   I emphasized the importance of routine surveillance of the patient's bypass, as the vascular surgery literature emphasize the improved patency possible with assisted primary patency procedures versus secondary patency procedures. The patient agrees to participate in their maximal medical care and routine surveillance.  Thank you for allowing [place] to participate in this patient's care.  Adele Barthel, MD [facility] [place] Office: [phone] Pager:  [phone]  07/13/2013, 11:09 AM

## 2013-07-16 ENCOUNTER — Other Ambulatory Visit: Payer: Self-pay

## 2013-07-16 MED ORDER — FREESTYLE LANCETS MISC: Status: DC

## 2013-07-16 MED ORDER — GLUCOSE BLOOD VI STRP: ORAL_STRIP | Status: DC

## 2013-07-17 ENCOUNTER — Other Ambulatory Visit: Payer: Self-pay | Admitting: Family Medicine

## 2013-07-30 ENCOUNTER — Telehealth: Payer: Self-pay | Admitting: Family Medicine

## 2013-07-30 ENCOUNTER — Encounter: Payer: Self-pay | Admitting: Family Medicine

## 2013-07-30 ENCOUNTER — Ambulatory Visit (INDEPENDENT_AMBULATORY_CARE_PROVIDER_SITE_OTHER): Payer: Medicare Other | Admitting: Family Medicine

## 2013-07-30 VITALS — BP 122/72 | HR 98 | Temp 98.1°F | Ht 72.0 in | Wt 222.0 lb

## 2013-07-30 DIAGNOSIS — E785 Hyperlipidemia, unspecified: Secondary | ICD-10-CM

## 2013-07-30 DIAGNOSIS — Z0184 Encounter for antibody response examination: Secondary | ICD-10-CM

## 2013-07-30 DIAGNOSIS — N400 Enlarged prostate without lower urinary tract symptoms: Secondary | ICD-10-CM

## 2013-07-30 DIAGNOSIS — E119 Type 2 diabetes mellitus without complications: Secondary | ICD-10-CM

## 2013-07-30 DIAGNOSIS — R21 Rash and other nonspecific skin eruption: Secondary | ICD-10-CM

## 2013-07-30 DIAGNOSIS — R350 Frequency of micturition: Secondary | ICD-10-CM

## 2013-07-30 DIAGNOSIS — E039 Hypothyroidism, unspecified: Secondary | ICD-10-CM

## 2013-07-30 MED ORDER — TAMSULOSIN HCL 0.4 MG PO CAPS: 0.4000 mg | ORAL_CAPSULE | Freq: Every day | ORAL | Status: DC

## 2013-07-30 NOTE — Telephone Encounter (Signed)
Lab order placed.

## 2013-07-30 NOTE — Progress Notes (Signed)
Pre visit review using our clinic review tool, if applicable. No additional management support is needed unless otherwise documented below in the visit note. 

## 2013-07-30 NOTE — Patient Instructions (Signed)
Benign Prostatic Hyperplasia An enlarged prostate (benign prostatic hyperplasia) is common in older men. You may experience the following:  Weak urine stream.  Dribbling.  Feeling like the bladder has not emptied completely.  Difficulty starting urination.  Getting up frequently at night to urinate.  Urinating more frequently during the day. HOME CARE INSTRUCTIONS  Monitor your prostatic hyperplasia for any changes. The following actions may help to alleviate any discomfort you are experiencing:  Give yourself time when you urinate.  Stay away from alcohol.  Avoid beverages containing caffeine, such as coffee, tea, and colas, because they can make the problem worse.  Avoid decongestants, antihistamines, and some prescription medicines that can make the problem worse.  Follow up with your health care provider for further treatment as recommended. SEEK MEDICAL CARE IF:  You are experiencing progressive difficulty voiding.  Your urine stream is progressively getting narrower.  You are awaking from sleep with the urge to void more frequently.  You are constantly feeling the need to void.  You experience loss of urine, especially in small amounts. SEEK IMMEDIATE MEDICAL CARE IF:   You develop increased pain with urination or are unable to urinate.  You develop severe abdominal pain, vomiting, a high fever, or fainting.  You develop back pain or blood in your urine. MAKE SURE YOU:   Understand these instructions.  Will watch your condition.  Will get help right away if you are not doing well or get worse. Document Released: 05/24/2005 Document Revised: 01/24/2013 Document Reviewed: 10/24/2012 [facility] Patient Information 2014 [place].

## 2013-07-30 NOTE — Telephone Encounter (Signed)
Lab order week of 09-23-2013 Labs prior to visit lipid, renal, cbc, hepatic, tsh, hgab1c, varicella titer in 2-4

## 2013-07-31 ENCOUNTER — Telehealth: Payer: Self-pay

## 2013-07-31 LAB — URINALYSIS
Bilirubin Urine: NEGATIVE
Glucose, UA: NEGATIVE mg/dL
Hgb urine dipstick: NEGATIVE
Ketones, ur: NEGATIVE mg/dL
Leukocytes, UA: NEGATIVE
Nitrite: NEGATIVE
Protein, ur: NEGATIVE mg/dL
Specific Gravity, Urine: 1.018 (ref 1.005–1.030)
Urobilinogen, UA: 1 mg/dL (ref 0.0–1.0)
pH: 6.5 (ref 5.0–8.0)

## 2013-07-31 LAB — MICROALBUMIN / CREATININE URINE RATIO
Creatinine, Urine: 87.4 mg/dL
Microalb Creat Ratio: 9.5 mg/g (ref 0.0–30.0)
Microalb, Ur: 0.83 mg/dL (ref 0.00–1.89)

## 2013-07-31 LAB — URINE CULTURE
Colony Count: NO GROWTH
Organism ID, Bacteria: NO GROWTH

## 2013-07-31 NOTE — Telephone Encounter (Signed)
Opened in error

## 2013-08-05 ENCOUNTER — Encounter: Payer: Self-pay | Admitting: Family Medicine

## 2013-08-05 NOTE — Assessment & Plan Note (Signed)
Well controlled, no changes 

## 2013-08-05 NOTE — Progress Notes (Signed)
Patient ID: Harry Brown, male   DOB: 07-22-1940, 73 y.o.   MRN: [id number] Harry Brown [id number] September 24, 1940 08/05/2013      Progress Note-Follow Up  Subjective  Chief Complaint  Chief Complaint  Patient presents with  . Follow-up    4 month    HPI  Patient is a 73 year old male who is in today for followup. She's generally doing well. He did have his left knee operated on since his last visit and had a very bad allergic reaction to the staples in it he sits. Had swelling rash and itching all around the incision. It is resolved at this time. He is noting some increased urinary frequency and urgency. Nocturia is present but he denies hematuria or flank pain. No dysuria. Taking medications as prescribed no chest pain, palpitations or shortness of breath. No GI complaints  Past Medical History  Diagnosis Date  . Diverticulitis   . CAD (coronary artery disease)   . Hypothyroidism   . ED (erectile dysfunction)   . Other and unspecified hyperlipidemia   . Neck pain on left side 11/22/2012  . Urinary urgency 11/22/2012  . Dizziness   . Hypertension   . Myocardial infarction 11/05/1985  . History of sleep apnea     had surgery to correct  . Type II diabetes mellitus     fasting 100-120  . Peripheral vascular disease   . Cellulitis 05/14/2013    RLE    Past Surgical History  Procedure Laterality Date  . Coronary artery bypass graft  2001    "CABG X3" (05/14/2013)  . Palate / uvula biopsy / excision  2003    Removed due to sleep apnea  . Anterior cervical decomp/discectomy fusion  ~ 2000  . Abdominal aortagram  Oct. 9, 2014    Dr. Bridgett Larsson  . Tonsillectomy    . Dental surgery      "multiple teeth removed; trmimed top of mouth so dentures would fit" (05/14/2013)  . Cataract extraction Right   . Femoral-popliteal bypass graft Right 04/25/2013    Procedure: RIGHT FEMORAL TO ABOVE KNEE POPLITEAL BYPASS GRAFT WITH RIGHT GREATER SAPHENOUS VEIN HARVEST; ULTRASOUND GUIDED;  Surgeon:  Conrad [place], MD;  Location: [facility];  Service: Vascular;  Laterality: Right;  . Gum surgery  [phone]    "had bone scraped; had periodontal disease" (05/14/2013)    Family History  Problem Relation Age of Onset  . Hyperlipidemia Mother   . Heart disease Mother   . Diabetes Mother   . Hypertension Mother   . Heart disease Father   . Cancer Neg Hx     History   Social History  . Marital Status: Married    Spouse Name: N/A    Number of Children: 8   . Years of Education: N/A   Occupational History  .     Social History Main Topics  . Smoking status: Former Smoker -- 1.50 packs/day for 50 years    Types: Cigarettes  . Smokeless tobacco: Never Used     Comment: 05/14/2013 "quit smoking in ~ 2004; still Uses Comit lozenges"  . Alcohol Use: No  . Drug Use: No  . Sexual Activity: Not Currently   Other Topics Concern  . Not on file   Social History Narrative  . No narrative on file    Current Outpatient Prescriptions on File Prior to Visit  Medication Sig Dispense Refill  . aspirin EC 81 MG tablet Take 81 mg by mouth at bedtime.      Marland Kitchen  atenolol (TENORMIN) 25 MG tablet TAKE ONE (1) TABLET EACH DAY  30 tablet  2  . atorvastatin (LIPITOR) 40 MG tablet TAKE ONE TABLET BY MOUTH ONCE DAILY  30 tablet  2  . AVODART 0.5 MG capsule TAKE ONE (1) CAPSULE EACH DAY  30 capsule  1  . glucose blood (FREESTYLE LITE) test strip Use to check blood sugar once a day as instructed.  Dx 250.00.  100 each  1  . Lancets (FREESTYLE) lancets Use to check blood sugar once a day.  Dx. 250.00.  100 each  1  . levothyroxine (SYNTHROID, LEVOTHROID) 50 MCG tablet Take 1 tablet (50 mcg total) by mouth daily before breakfast.  30 tablet  2  . loratadine (ALAVERT) 10 MG tablet Take 10 mg by mouth daily as needed for allergies.      . metFORMIN (GLUCOPHAGE-XR) 500 MG 24 hr tablet TAKE TWO TABLETS BY MOUTH DAILY WITH BREAKFAST  60 tablet  1  . Multiple Vitamin (MULTIVITAMIN WITH MINERALS) TABS tablet Take 1 tablet  by mouth daily.      Marland Kitchen oxyCODONE (OXY IR/ROXICODONE) 5 MG immediate release tablet Take 1 tablet (5 mg total) by mouth every 6 (six) hours as needed for severe pain.  30 tablet  0   No current facility-administered medications on file prior to visit.    Allergies  Allergen Reactions  . Adhesive [Tape]   . Latex     rash  . Other Hives    Plastic shopping bags Metal staples: rash, swelling    Review of Systems  Review of Systems  Constitutional: Negative for fever and malaise/fatigue.  HENT: Negative for congestion.   Eyes: Negative for discharge.  Respiratory: Negative for shortness of breath.   Cardiovascular: Negative for chest pain, palpitations and leg swelling.  Gastrointestinal: Negative for nausea, abdominal pain and diarrhea.  Genitourinary: Negative for dysuria.  Musculoskeletal: Negative for falls.  Skin: Positive for itching and rash.       Left leg around incision site  Neurological: Negative for loss of consciousness and headaches.  Endo/Heme/Allergies: Negative for polydipsia.  Psychiatric/Behavioral: Negative for depression and suicidal ideas. The patient is not nervous/anxious and does not have insomnia.      Objective  BP 122/72  Pulse 98  Temp(Src) 98.1 F (36.7 C) (Oral)  Ht 6' (1.829 m)  Wt 222 lb 0.6 oz (100.717 kg)  BMI 30.11 kg/m2  SpO2 98%  Physical Exam  Physical Exam  Constitutional: He is oriented to person, place, and time and well-developed, well-nourished, and in no distress. No distress.  HENT:  Head: Normocephalic and atraumatic.  Eyes: Conjunctivae are normal.  Neck: Neck supple. No thyromegaly present.  Cardiovascular: Normal rate, regular rhythm and normal heart sounds.   No murmur heard. Pulmonary/Chest: Effort normal and breath sounds normal. No respiratory distress.  Abdominal: He exhibits no distension and no mass. There is no tenderness.  Musculoskeletal: He exhibits no edema.  Neurological: He is alert and oriented to  person, place, and time.  Skin: Skin is warm.  Left knee incision, skin around hypertrophied and erythematous but not fluctuant  Psychiatric: Memory, affect and judgment normal.    Lab Results  Component Value Date   TSH 3.562 03/20/2013   Lab Results  Component Value Date   WBC 8.9 05/17/2013   HGB 11.3* 05/17/2013   HCT 33.4* 05/17/2013   MCV 89.5 05/17/2013   PLT 320 05/17/2013   Lab Results  Component Value Date   CREATININE  1.01 05/16/2013   BUN 23 05/16/2013   NA 135 05/16/2013   K 4.1 05/16/2013   CL 101 05/16/2013   CO2 23 05/16/2013   Lab Results  Component Value Date   ALT 18 05/14/2013   AST 18 05/14/2013   ALKPHOS 55 05/14/2013   BILITOT 0.9 05/14/2013   Lab Results  Component Value Date   CHOL 119 03/20/2013   Lab Results  Component Value Date   HDL 40 03/20/2013   Lab Results  Component Value Date   LDLCALC 60 03/20/2013   Lab Results  Component Value Date   TRIG 96 03/20/2013   Lab Results  Component Value Date   CHOLHDL 3.0 03/20/2013     Assessment & Plan  DM (diabetes mellitus) Well controlled, no changes  Hypothyroidism Well treted on current dose of levothyroxine  Rash Had a very bad reaction to the staples, adhesive, latex after his surgery, now resolved.  BPH (benign prostatic hyperplasia) Increased frequency and nocutria, started on Flomax and urinalysis checked

## 2013-08-05 NOTE — Assessment & Plan Note (Signed)
Increased frequency and nocutria, started on Flomax and urinalysis checked

## 2013-08-05 NOTE — Assessment & Plan Note (Signed)
Well treted on current dose of levothyroxine

## 2013-08-05 NOTE — Assessment & Plan Note (Signed)
Had a very bad reaction to the staples, adhesive, latex after his surgery, now resolved.

## 2013-08-27 ENCOUNTER — Ambulatory Visit: Payer: Medicare Other | Admitting: Podiatry

## 2013-09-10 ENCOUNTER — Other Ambulatory Visit: Payer: Self-pay | Admitting: Family Medicine

## 2013-09-20 ENCOUNTER — Other Ambulatory Visit: Payer: Self-pay | Admitting: Family Medicine

## 2013-09-20 LAB — CBC
HCT: 37.9 % — ABNORMAL LOW (ref 39.0–52.0)
Hemoglobin: 12.5 g/dL — ABNORMAL LOW (ref 13.0–17.0)
MCH: 29.1 pg (ref 26.0–34.0)
MCHC: 33 g/dL (ref 30.0–36.0)
MCV: 88.1 fL (ref 78.0–100.0)
Platelets: 242 10*3/uL (ref 150–400)
RBC: 4.3 MIL/uL (ref 4.22–5.81)
RDW: 14.5 % (ref 11.5–15.5)
WBC: 7 10*3/uL (ref 4.0–10.5)

## 2013-09-20 LAB — RENAL FUNCTION PANEL
Albumin: 4 g/dL (ref 3.5–5.2)
BUN: 25 mg/dL — ABNORMAL HIGH (ref 6–23)
CO2: 25 mEq/L (ref 19–32)
Calcium: 8.9 mg/dL (ref 8.4–10.5)
Chloride: 103 mEq/L (ref 96–112)
Creat: 0.94 mg/dL (ref 0.50–1.35)
Glucose, Bld: 118 mg/dL — ABNORMAL HIGH (ref 70–99)
Phosphorus: 3.5 mg/dL (ref 2.3–4.6)
Potassium: 4.8 mEq/L (ref 3.5–5.3)
Sodium: 137 mEq/L (ref 135–145)

## 2013-09-20 LAB — LIPID PANEL
Cholesterol: 137 mg/dL (ref 0–200)
HDL: 47 mg/dL (ref >39)
LDL Cholesterol: 77 mg/dL (ref 0–99)
Total CHOL/HDL Ratio: 2.9 Ratio
Triglycerides: 65 mg/dL (ref <150)
VLDL: 13 mg/dL (ref 0–40)

## 2013-09-20 LAB — HEMOGLOBIN A1C
Hgb A1c MFr Bld: 6.5 % — ABNORMAL HIGH (ref <5.7)
Mean Plasma Glucose: 140 mg/dL — ABNORMAL HIGH (ref <117)

## 2013-09-20 LAB — HEPATIC FUNCTION PANEL
ALT: 20 U/L (ref 0–53)
AST: 20 U/L (ref 0–37)
Albumin: 4 g/dL (ref 3.5–5.2)
Alkaline Phosphatase: 57 U/L (ref 39–117)
Bilirubin, Direct: 0.1 mg/dL (ref 0.0–0.3)
Indirect Bilirubin: 0.4 mg/dL (ref 0.2–1.2)
Total Bilirubin: 0.5 mg/dL (ref 0.2–1.2)
Total Protein: 6.8 g/dL (ref 6.0–8.3)

## 2013-09-20 LAB — TSH: TSH: 4.283 u[IU]/mL (ref 0.350–4.500)

## 2013-09-21 LAB — VARICELLA ZOSTER ANTIBODY, IGG: Varicella IgG: 598 Index — ABNORMAL HIGH (ref <135.00)

## 2013-09-24 ENCOUNTER — Ambulatory Visit (INDEPENDENT_AMBULATORY_CARE_PROVIDER_SITE_OTHER): Payer: Medicare Other | Admitting: Family Medicine

## 2013-09-24 ENCOUNTER — Encounter: Payer: Self-pay | Admitting: Family Medicine

## 2013-09-24 VITALS — BP 106/70 | HR 74 | Temp 98.2°F | Ht 72.0 in | Wt 225.0 lb

## 2013-09-24 DIAGNOSIS — Z23 Encounter for immunization: Secondary | ICD-10-CM

## 2013-09-24 DIAGNOSIS — I70219 Atherosclerosis of native arteries of extremities with intermittent claudication, unspecified extremity: Secondary | ICD-10-CM

## 2013-09-24 DIAGNOSIS — N400 Enlarged prostate without lower urinary tract symptoms: Secondary | ICD-10-CM

## 2013-09-24 DIAGNOSIS — E785 Hyperlipidemia, unspecified: Secondary | ICD-10-CM

## 2013-09-24 DIAGNOSIS — K5730 Diverticulosis of large intestine without perforation or abscess without bleeding: Secondary | ICD-10-CM

## 2013-09-24 DIAGNOSIS — R350 Frequency of micturition: Secondary | ICD-10-CM

## 2013-09-24 DIAGNOSIS — E039 Hypothyroidism, unspecified: Secondary | ICD-10-CM

## 2013-09-24 DIAGNOSIS — D649 Anemia, unspecified: Secondary | ICD-10-CM

## 2013-09-24 DIAGNOSIS — D62 Acute posthemorrhagic anemia: Secondary | ICD-10-CM

## 2013-09-24 DIAGNOSIS — E119 Type 2 diabetes mellitus without complications: Secondary | ICD-10-CM

## 2013-09-24 DIAGNOSIS — K5790 Diverticulosis of intestine, part unspecified, without perforation or abscess without bleeding: Secondary | ICD-10-CM

## 2013-09-24 HISTORY — DX: Anemia, unspecified: D64.9

## 2013-09-24 HISTORY — DX: Acute posthemorrhagic anemia: D62

## 2013-09-24 MED ORDER — ZOSTER VACCINE LIVE 19400 UNT/0.65ML ~~LOC~~ SOLR: 0.6500 mL | Freq: Once | SUBCUTANEOUS | Status: DC

## 2013-09-24 MED ORDER — TAMSULOSIN HCL 0.4 MG PO CAPS: 0.4000 mg | ORAL_CAPSULE | Freq: Every day | ORAL | Status: DC

## 2013-09-24 NOTE — Patient Instructions (Addendum)

## 2013-09-24 NOTE — Assessment & Plan Note (Signed)
hgba1c acceptable, minimize simple carbs. Increase exercise as tolerated. Continue current meds 

## 2013-09-24 NOTE — Assessment & Plan Note (Signed)
Requesting old colonoscopies today

## 2013-09-24 NOTE — Assessment & Plan Note (Signed)
Has appt with vascular this week doing well

## 2013-09-24 NOTE — Assessment & Plan Note (Signed)
Good response to Flomax, sent in refills

## 2013-09-24 NOTE — Assessment & Plan Note (Signed)
Tolerating statin, encouraged heart healthy diet, avoid trans fats, minimize simple carbs and saturated fats. Increase exercise as tolerated 

## 2013-09-24 NOTE — Progress Notes (Signed)
Pre visit review using our clinic review tool, if applicable. No additional management support is needed unless otherwise documented below in the visit note. 

## 2013-09-24 NOTE — Progress Notes (Signed)
Patient ID: Harry Brown, male   DOB: 1940/10/21, 73 y.o.   MRN: [id number] EZERIAH LUTY [id number] 10/25/1940 09/24/2013      Progress Note-Follow Up  Subjective  Chief Complaint  Chief Complaint  Patient presents with  . Follow-up    8 week    HPI  Patient is a 73 year old male in today for routine medical care. Routine followup. He feels well. No recent illness. He has an appointment with his vascular surgeon later this week for reevaluation but is not having any symptoms at the present time.Denies CP/palp/SOB/HA/congestion/fevers/GI or GU c/o. Taking meds as prescribed. Good response to Floamax, frequency and urgency have resolved  Past Medical History  Diagnosis Date  . Diverticulitis   . CAD (coronary artery disease)   . Hypothyroidism   . ED (erectile dysfunction)   . Other and unspecified hyperlipidemia   . Neck pain on left side 11/22/2012  . Urinary urgency 11/22/2012  . Dizziness   . Hypertension   . Myocardial infarction 11/05/1985  . History of sleep apnea     had surgery to correct  . Type II diabetes mellitus     fasting 100-120  . Peripheral vascular disease   . Cellulitis 05/14/2013    RLE  . Postoperative anemia due to acute blood loss 09/24/2013  . Anemia 09/24/2013    Past Surgical History  Procedure Laterality Date  . Coronary artery bypass graft  2001    "CABG X3" (05/14/2013)  . Palate / uvula biopsy / excision  2003    Removed due to sleep apnea  . Anterior cervical decomp/discectomy fusion  ~ 2000  . Abdominal aortagram  Oct. 9, 2014    Dr. Bridgett Larsson  . Tonsillectomy    . Dental surgery      "multiple teeth removed; trmimed top of mouth so dentures would fit" (05/14/2013)  . Cataract extraction Right   . Femoral-popliteal bypass graft Right 04/25/2013    Procedure: RIGHT FEMORAL TO ABOVE KNEE POPLITEAL BYPASS GRAFT WITH RIGHT GREATER SAPHENOUS VEIN HARVEST; ULTRASOUND GUIDED;  Surgeon: Conrad Boy River, MD;  Location: [facility];  Service: Vascular;   Laterality: Right;  . Gum surgery  [phone]    "had bone scraped; had periodontal disease" (05/14/2013)    Family History  Problem Relation Age of Onset  . Hyperlipidemia Mother   . Heart disease Mother   . Diabetes Mother   . Hypertension Mother   . Heart disease Father   . Cancer Neg Hx     History   Social History  . Marital Status: Married    Spouse Name: N/A    Number of Children: 8   . Years of Education: N/A   Occupational History  .     Social History Main Topics  . Smoking status: Former Smoker -- 1.50 packs/day for 50 years    Types: Cigarettes  . Smokeless tobacco: Never Used     Comment: 05/14/2013 "quit smoking in ~ 2004; still Uses Comit lozenges"  . Alcohol Use: No  . Drug Use: No  . Sexual Activity: Not Currently   Other Topics Concern  . Not on file   Social History Narrative  . No narrative on file    Current Outpatient Prescriptions on File Prior to Visit  Medication Sig Dispense Refill  . aspirin EC 81 MG tablet Take 81 mg by mouth at bedtime.      Marland Kitchen atenolol (TENORMIN) 25 MG tablet TAKE ONE (1) TABLET EACH DAY  30 tablet  2  . atorvastatin (LIPITOR) 40 MG tablet TAKE ONE TABLET BY MOUTH ONCE DAILY  30 tablet  2  . AVODART 0.5 MG capsule TAKE ONE CAPSULE BY MOUTH DAILY  30 capsule  1  . glucose blood (FREESTYLE LITE) test strip Use to check blood sugar once a day as instructed.  Dx 250.00.  100 each  1  . Lancets (FREESTYLE) lancets Use to check blood sugar once a day.  Dx. 250.00.  100 each  1  . levothyroxine (SYNTHROID, LEVOTHROID) 50 MCG tablet Take 1 tablet (50 mcg total) by mouth daily before breakfast.  30 tablet  2  . loratadine (ALAVERT) 10 MG tablet Take 10 mg by mouth daily as needed for allergies.      . metFORMIN (GLUCOPHAGE-XR) 500 MG 24 hr tablet TAKE TWO (2) TABLETS BY MOUTH DAILY WITHBREAKFAST  60 tablet  1  . Multiple Vitamin (MULTIVITAMIN WITH MINERALS) TABS tablet Take 1 tablet by mouth daily.      Marland Kitchen oxyCODONE (OXY  IR/ROXICODONE) 5 MG immediate release tablet Take 1 tablet (5 mg total) by mouth every 6 (six) hours as needed for severe pain.  30 tablet  0  . tamsulosin (FLOMAX) 0.4 MG CAPS capsule Take 1 capsule (0.4 mg total) by mouth daily.  30 capsule  3   No current facility-administered medications on file prior to visit.    Allergies  Allergen Reactions  . Adhesive [Tape]   . Latex     rash  . Other Hives    Plastic shopping bags Metal staples: rash, swelling    Review of Systems  Review of Systems  Constitutional: Negative for fever and malaise/fatigue.  HENT: Negative for congestion.   Eyes: Negative for discharge.  Respiratory: Negative for shortness of breath.   Cardiovascular: Negative for chest pain, palpitations and leg swelling.  Gastrointestinal: Negative for nausea, abdominal pain and diarrhea.  Genitourinary: Negative for dysuria.  Musculoskeletal: Negative for falls.  Skin: Negative for rash.  Neurological: Negative for loss of consciousness and headaches.  Endo/Heme/Allergies: Negative for polydipsia.  Psychiatric/Behavioral: Negative for depression and suicidal ideas. The patient is not nervous/anxious and does not have insomnia.     Objective  BP 106/70  Pulse 74  Temp(Src) 98.2 F (36.8 C) (Oral)  Ht 6' (1.829 m)  Wt 225 lb (102.059 kg)  BMI 30.51 kg/m2  SpO2 97%  Physical Exam  Physical Exam  Constitutional: He is oriented to person, place, and time and well-developed, well-nourished, and in no distress. No distress.  HENT:  Head: Normocephalic and atraumatic.  Eyes: Conjunctivae are normal.  Neck: Neck supple. No thyromegaly present.  Cardiovascular: Normal rate, regular rhythm and normal heart sounds.   No murmur heard. Pulmonary/Chest: Effort normal and breath sounds normal. No respiratory distress.  Abdominal: He exhibits no distension and no mass. There is no tenderness.  Musculoskeletal: He exhibits no edema.  Neurological: He is alert and  oriented to person, place, and time.  Skin: Skin is warm.  Psychiatric: Memory, affect and judgment normal.    Lab Results  Component Value Date   TSH 4.283 09/20/2013   Lab Results  Component Value Date   WBC 7.0 09/20/2013   HGB 12.5* 09/20/2013   HCT 37.9* 09/20/2013   MCV 88.1 09/20/2013   PLT 242 09/20/2013   Lab Results  Component Value Date   CREATININE 0.94 09/20/2013   BUN 25* 09/20/2013   NA 137 09/20/2013   K 4.8 09/20/2013  CL 103 09/20/2013   CO2 25 09/20/2013   Lab Results  Component Value Date   ALT 20 09/20/2013   AST 20 09/20/2013   ALKPHOS 57 09/20/2013   BILITOT 0.5 09/20/2013   Lab Results  Component Value Date   CHOL 137 09/20/2013   Lab Results  Component Value Date   HDL 47 09/20/2013   Lab Results  Component Value Date   LDLCALC 77 09/20/2013   Lab Results  Component Value Date   TRIG 65 09/20/2013   Lab Results  Component Value Date   CHOLHDL 2.9 09/20/2013     Assessment & Plan  DM (diabetes mellitus) hgba1c acceptable, minimize simple carbs. Increase exercise as tolerated. Continue current meds  Hypothyroidism On Levothyroxine, continue to monitor  BPH (benign prostatic hypertrophy) Good response to Flomax, sent in refills  Other and unspecified hyperlipidemia Tolerating statin, encouraged heart healthy diet, avoid trans fats, minimize simple carbs and saturated fats. Increase exercise as tolerated  Atherosclerosis of native arteries of the extremities with intermittent claudication Has appt with vascular this week doing well  Diverticulosis Requesting old colonoscopies today  Anemia Improving but persistent. Encouraged increased leafy greens. Sent home  with IFOB and check iron studies and vita min B 12 with next visit.

## 2013-09-24 NOTE — Assessment & Plan Note (Signed)
On Levothyroxine, continue to monitor 

## 2013-09-24 NOTE — Assessment & Plan Note (Signed)
Improving but persistent. Encouraged increased leafy greens. Sent home  with IFOB and check iron studies and vita min B 12 with next visit.

## 2013-09-27 ENCOUNTER — Encounter: Payer: Self-pay | Admitting: Vascular Surgery

## 2013-09-28 ENCOUNTER — Ambulatory Visit (INDEPENDENT_AMBULATORY_CARE_PROVIDER_SITE_OTHER)
Admission: RE | Admit: 2013-09-28 | Discharge: 2013-09-28 | Disposition: A | Discharge status: Home / Self Care | Payer: Medicare Other | Source: Ambulatory Visit | Attending: Vascular Surgery | Admitting: Vascular Surgery

## 2013-09-28 ENCOUNTER — Ambulatory Visit (HOSPITAL_COMMUNITY)
Admission: RE | Admit: 2013-09-28 | Discharge: 2013-09-28 | Disposition: A | Discharge status: Home / Self Care | Payer: Medicare Other | Source: Ambulatory Visit | Attending: Vascular Surgery | Admitting: Vascular Surgery

## 2013-09-28 ENCOUNTER — Encounter: Payer: Self-pay | Admitting: Vascular Surgery

## 2013-09-28 ENCOUNTER — Ambulatory Visit (INDEPENDENT_AMBULATORY_CARE_PROVIDER_SITE_OTHER): Payer: Medicare Other | Admitting: Vascular Surgery

## 2013-09-28 VITALS — BP 137/75 | HR 69 | Resp 18 | Ht 72.0 in | Wt 224.7 lb

## 2013-09-28 DIAGNOSIS — I70219 Atherosclerosis of native arteries of extremities with intermittent claudication, unspecified extremity: Secondary | ICD-10-CM

## 2013-09-28 DIAGNOSIS — Z48812 Encounter for surgical aftercare following surgery on the circulatory system: Secondary | ICD-10-CM

## 2013-09-28 NOTE — Progress Notes (Signed)
    Established Previous Bypass  History of Present Illness  Harry Brown is a 73 y.o. (01/17/1941) male who presents with chief complaint: routine surveillance.  Previous operation(s) complete on 04/25/13 include: R CFA to AK pop BPG w/ NR ips GSV.  This was complicated with vein harvest site infection.  The patient's previous symptoms have resolved.  The patient's symptoms are: none.  . The patient's treatment regimen currently included: maximal medical management.  The patient's PMH, PSH, SH, FamHx, Med, and Allergies are unchanged from 07/13/13.  On ROS today: no intermittent claudication , no rest pain, all incision healed  Physical Examination  Filed Vitals:   09/28/13 1128  BP: 137/75  Pulse: 69  Resp: 18  Height: 6' (1.829 m)  Weight: 224 lb 11.2 oz (101.923 kg)   Body mass index is 30.47 kg/(m^2).  Physical Examination  Filed Vitals:   09/28/13 1128  BP: 137/75  Pulse: 69  Resp: 18  Height: 6' (1.829 m)  Weight: 224 lb 11.2 oz (101.923 kg)   Body mass index is 30.47 kg/(m^2).  General: A&O x 3, WD, mildly obese  Pulmonary: Sym exp, good air movt, CTAB, no rales, rhonchi, & wheezing  Cardiac: RRR, Nl S1, S2, no Murmurs, rubs or gallops  Vascular: Vessel Right Left  Radial Palpable Palpable  Brachial Palpable Palpable  Carotid Palpable, without bruit Palpable, without bruit  Aorta Not palpable N/A  Femoral Palpable Palpable  Popliteal Not palpable Not palpable  PT Palpable Palpable  DP Palpable Not Palpable   Gastrointestinal: soft, NTND, -G/R, - HSM, - masses, - CVAT B  Musculoskeletal: M/S 5/5 throughout , Extremities without ischemic changes   Neurologic: Pain and light touch intact in extremities , Motor exam as listed above  Non-Invasive Vascular Imaging ABI (Date: 09/28/2013) R: 1.15, DP: bi, PT: bi, TBI: 0.75 L: 0.93, DP: mono, PT: bi, TBI: 0.55  RLE arterial duplex (Date: 09/28/2013)  Widely patent bypass  Calcification of popliteal  artery noted  Medical Decision Making  TRISTAIN DAILY is a 73 y.o. male who presents with: s/p R fem-AK pop bypass w/ GSV .  Based on the patient's vascular studies and examination, I have offered the patient: BLE ABI, bypass duplex q/6 months  I discussed in depth with the patient the nature of atherosclerosis, and emphasized the importance of maximal medical management including strict control of blood pressure, blood glucose, and lipid levels, obtaining regular exercise, and cessation of smoking.  The patient is aware that without maximal medical management the underlying atherosclerotic disease process will progress, limiting the benefit of any interventions.  I discussed in depth with the patient the need for long term surveillance to improve the primary assisted patency of his bypass.  The patient agrees to cooperate with such.  The patient is scheduled for the previous listed surveillance studies in 6 months.  Thank you for allowing [place] to participate in this patient's care.  Adele Barthel, MD [facility] Office: [phone] Pager: [phone]  09/28/2013, 11:45 AM

## 2013-09-28 NOTE — Addendum Note (Signed)
Addended by: Mena Goes on: 09/28/2013 05:04 PM   Modules accepted: Orders

## 2013-10-01 ENCOUNTER — Other Ambulatory Visit: Payer: Self-pay | Admitting: Family Medicine

## 2013-10-01 NOTE — Telephone Encounter (Signed)
Rx request to pharmacy/SLS  

## 2013-10-03 ENCOUNTER — Telehealth: Payer: Self-pay

## 2013-10-03 NOTE — Telephone Encounter (Signed)
Relevant patient education assigned to patient using Emmi. ° °

## 2013-10-12 ENCOUNTER — Other Ambulatory Visit: Payer: Self-pay | Admitting: Family Medicine

## 2013-11-12 ENCOUNTER — Other Ambulatory Visit: Payer: Self-pay | Admitting: Family Medicine

## 2013-12-11 ENCOUNTER — Other Ambulatory Visit: Payer: Self-pay | Admitting: Family Medicine

## 2013-12-21 ENCOUNTER — Other Ambulatory Visit: Payer: Self-pay | Admitting: Family Medicine

## 2014-01-28 ENCOUNTER — Other Ambulatory Visit: Payer: Self-pay | Admitting: Family Medicine

## 2014-01-29 ENCOUNTER — Telehealth: Payer: Self-pay | Admitting: Family Medicine

## 2014-01-29 ENCOUNTER — Encounter: Payer: Self-pay | Admitting: Family Medicine

## 2014-01-29 ENCOUNTER — Ambulatory Visit (INDEPENDENT_AMBULATORY_CARE_PROVIDER_SITE_OTHER): Payer: Medicare Other | Admitting: Family Medicine

## 2014-01-29 VITALS — BP 126/84 | HR 71 | Temp 98.2°F | Ht 72.0 in | Wt 229.1 lb

## 2014-01-29 DIAGNOSIS — E039 Hypothyroidism, unspecified: Secondary | ICD-10-CM

## 2014-01-29 DIAGNOSIS — E1165 Type 2 diabetes mellitus with hyperglycemia: Secondary | ICD-10-CM

## 2014-01-29 DIAGNOSIS — E1129 Type 2 diabetes mellitus with other diabetic kidney complication: Secondary | ICD-10-CM

## 2014-01-29 DIAGNOSIS — E785 Hyperlipidemia, unspecified: Secondary | ICD-10-CM

## 2014-01-29 DIAGNOSIS — E1122 Type 2 diabetes mellitus with diabetic chronic kidney disease: Secondary | ICD-10-CM

## 2014-01-29 DIAGNOSIS — Z23 Encounter for immunization: Secondary | ICD-10-CM

## 2014-01-29 DIAGNOSIS — D649 Anemia, unspecified: Secondary | ICD-10-CM

## 2014-01-29 DIAGNOSIS — E038 Other specified hypothyroidism: Secondary | ICD-10-CM

## 2014-01-29 DIAGNOSIS — E118 Type 2 diabetes mellitus with unspecified complications: Secondary | ICD-10-CM

## 2014-01-29 DIAGNOSIS — N189 Chronic kidney disease, unspecified: Secondary | ICD-10-CM

## 2014-01-29 DIAGNOSIS — IMO0002 Reserved for concepts with insufficient information to code with codable children: Secondary | ICD-10-CM

## 2014-01-29 DIAGNOSIS — E119 Type 2 diabetes mellitus without complications: Secondary | ICD-10-CM

## 2014-01-29 DIAGNOSIS — Z0000 Encounter for general adult medical examination without abnormal findings: Secondary | ICD-10-CM

## 2014-01-29 LAB — RENAL FUNCTION PANEL
Albumin: 4.1 g/dL (ref 3.5–5.2)
BUN: 24 mg/dL — ABNORMAL HIGH (ref 6–23)
CO2: 27 mEq/L (ref 19–32)
Calcium: 9.1 mg/dL (ref 8.4–10.5)
Chloride: 104 mEq/L (ref 96–112)
Creat: 1.01 mg/dL (ref 0.50–1.35)
Glucose, Bld: 132 mg/dL — ABNORMAL HIGH (ref 70–99)
Phosphorus: 2.8 mg/dL (ref 2.3–4.6)
Potassium: 4.7 mEq/L (ref 3.5–5.3)
Sodium: 136 mEq/L (ref 135–145)

## 2014-01-29 LAB — HEPATIC FUNCTION PANEL
ALT: 22 U/L (ref 0–53)
AST: 21 U/L (ref 0–37)
Albumin: 4.1 g/dL (ref 3.5–5.2)
Alkaline Phosphatase: 60 U/L (ref 39–117)
Bilirubin, Direct: 0.1 mg/dL (ref 0.0–0.3)
Indirect Bilirubin: 0.4 mg/dL (ref 0.2–1.2)
Total Bilirubin: 0.5 mg/dL (ref 0.2–1.2)
Total Protein: 6.7 g/dL (ref 6.0–8.3)

## 2014-01-29 LAB — CBC
HCT: 37.4 % — ABNORMAL LOW (ref 39.0–52.0)
Hemoglobin: 12.6 g/dL — ABNORMAL LOW (ref 13.0–17.0)
MCH: 29.5 pg (ref 26.0–34.0)
MCHC: 33.7 g/dL (ref 30.0–36.0)
MCV: 87.6 fL (ref 78.0–100.0)
Platelets: 251 10*3/uL (ref 150–400)
RBC: 4.27 MIL/uL (ref 4.22–5.81)
RDW: 14.1 % (ref 11.5–15.5)
WBC: 8.9 10*3/uL (ref 4.0–10.5)

## 2014-01-29 LAB — LIPID PANEL
Cholesterol: 128 mg/dL (ref 0–200)
HDL: 42 mg/dL (ref >39)
LDL Cholesterol: 69 mg/dL (ref 0–99)
Total CHOL/HDL Ratio: 3 Ratio
Triglycerides: 85 mg/dL (ref <150)
VLDL: 17 mg/dL (ref 0–40)

## 2014-01-29 LAB — TSH: TSH: 4.059 u[IU]/mL (ref 0.350–4.500)

## 2014-01-29 LAB — HEMOGLOBIN A1C
Hgb A1c MFr Bld: 7.2 % — ABNORMAL HIGH (ref <5.7)
Mean Plasma Glucose: 160 mg/dL — ABNORMAL HIGH (ref <117)

## 2014-01-29 NOTE — Progress Notes (Signed)
Pre visit review using our clinic review tool, if applicable. No additional management support is needed unless otherwise documented below in the visit note. 

## 2014-01-29 NOTE — Patient Instructions (Signed)
Hyland's leg cramp med or a calcium/magnesium tab daily and hydrate well. Try topical treatments such as Salon Pas gel or Aspercreme  Leg Cramps Leg cramps that occur during exercise can be caused by poor circulation or dehydration. However, muscle cramps that occur at rest or during the night are usually not due to any serious medical problem. Heat cramps may cause muscle spasms during hot weather.  CAUSES There is no clear cause for muscle cramps. However, dehydration may be a factor for those who do not drink enough fluids and those who exercise in the heat. Imbalances in the level of sodium, potassium, calcium or magnesium in the muscle tissue may also be a factor. Some medications, such as water pills (diuretics), may cause loss of chemicals that the body needs (like sodium and potassium) and cause muscle cramps. TREATMENT   Make sure your diet has enough fluids and essential minerals for the muscle to work normally.  Avoid strenuous exercise for several days if you have been having frequent leg cramps.  Stretch and massage the cramped muscle for several minutes.  Some medicines may be helpful in some patients with night cramps. Only take over-the-counter or prescription medicines as directed by your caregiver. SEEK IMMEDIATE MEDICAL CARE IF:   Your leg cramps become worse.  Your foot becomes cold, numb, or blue. Document Released: 07/01/2004 Document Revised: 08/16/2011 Document Reviewed: 06/18/2008 [facility] Patient Information 2015 [place], [place]. This information is not intended to replace advice given to you by your health care provider. Make sure you discuss any questions you have with your health care provider.

## 2014-01-29 NOTE — Telephone Encounter (Signed)
Lab order 2-15=-2015 lipid renal cbc tsh hepatic hbga1c

## 2014-02-03 ENCOUNTER — Encounter: Payer: Self-pay | Admitting: Family Medicine

## 2014-02-03 NOTE — Assessment & Plan Note (Addendum)
Patient denies any difficulties at home. No trouble with ADLs, depression or falls. No recent changes to vision or hearing. Is UTD with immunizations. Is UTD with screening. Discussed Advanced Directives, patient agrees to bring [place] copies of documents if can. Encouraged heart healthy diet, exercise as tolerated and adequate sleep. Given flu shot today. Sees Dr Adele Barthel of Vascular Surgery Sees podiatry, Dr Caffie Pinto

## 2014-02-03 NOTE — Progress Notes (Signed)
Patient ID: BROWN DUNLAP, male   DOB: 04/23/1941, 73 y.o.   MRN: [id number] LANG ZINGG [id number] 03-04-1941 02/03/2014      Progress Note-Follow Up  Subjective  Chief Complaint  Chief Complaint  Patient presents with  . Annual Exam    medicare wellness  . Injections    flu    HPI  Patient is a 73 year old male in today for routine medical care. Feeling well. No recent illness. Has been very active, is exercising regularly. Eating a heart healthy diet. Denies CP/palp/SOB/HA/congestion/fevers/GI or GU c/o. Taking meds as prescribed. Offers no acute concerns.  Past Medical History  Diagnosis Date  . Diverticulitis   . CAD (coronary artery disease)   . Hypothyroidism   . ED (erectile dysfunction)   . Other and unspecified hyperlipidemia   . Neck pain on left side 11/22/2012  . Urinary urgency 11/22/2012  . Dizziness   . Hypertension   . Myocardial infarction 11/05/1985  . History of sleep apnea     had surgery to correct  . Type II diabetes mellitus     fasting 100-120  . Peripheral vascular disease   . Cellulitis 05/14/2013    RLE  . Postoperative anemia due to acute blood loss 09/24/2013  . Anemia 09/24/2013  . Medicare annual wellness visit, subsequent 04/13/2013    Past Surgical History  Procedure Laterality Date  . Coronary artery bypass graft  2001    "CABG X3" (05/14/2013)  . Palate / uvula biopsy / excision  2003    Removed due to sleep apnea  . Anterior cervical decomp/discectomy fusion  ~ 2000  . Abdominal aortagram  Oct. 9, 2014    Dr. Bridgett Larsson  . Tonsillectomy    . Dental surgery      "multiple teeth removed; trmimed top of mouth so dentures would fit" (05/14/2013)  . Cataract extraction Right   . Femoral-popliteal bypass graft Right 04/25/2013    Procedure: RIGHT FEMORAL TO ABOVE KNEE POPLITEAL BYPASS GRAFT WITH RIGHT GREATER SAPHENOUS VEIN HARVEST; ULTRASOUND GUIDED;  Surgeon: Conrad Harrells, MD;  Location: [facility];  Service: Vascular;  Laterality: Right;   . Gum surgery  [phone]    "had bone scraped; had periodontal disease" (05/14/2013)    Family History  Problem Relation Age of Onset  . Hyperlipidemia Mother   . Heart disease Mother   . Diabetes Mother   . Hypertension Mother   . Heart disease Father   . Asthma Father   . Arthritis Father   . Cancer Neg Hx   . Heart disease Sister   . Heart disease Sister     s/p bypass x 2  . Lupus Sister   . Heart disease Brother     MI    History   Social History  . Marital Status: Married    Spouse Name: N/A    Number of Children: 8   . Years of Education: N/A   Occupational History  .     Social History Main Topics  . Smoking status: Former Smoker -- 1.50 packs/day for 50 years    Types: Cigarettes  . Smokeless tobacco: Never Used     Comment: 05/14/2013 "quit smoking in ~ 2004; still Uses Comit lozenges"  . Alcohol Use: No  . Drug Use: No  . Sexual Activity: Not Currently     Comment: lives with wife, retiring end of next week, no dietary restrictions   Other Topics Concern  . Not on file  Social History Narrative  . No narrative on file    Current Outpatient Prescriptions on File Prior to Visit  Medication Sig Dispense Refill  . aspirin EC 81 MG tablet Take 81 mg by mouth at bedtime.      Marland Kitchen atenolol (TENORMIN) 25 MG tablet TAKE ONE (1) TABLET EACH DAY  30 tablet  3  . atorvastatin (LIPITOR) 40 MG tablet TAKE ONE (1) TABLET EACH DAY  30 tablet  3  . AVODART 0.5 MG capsule TAKE ONE (1) CAPSULE EACH DAY  30 capsule  2  . glucose blood (FREESTYLE LITE) test strip Use to check blood sugar once a day as instructed.  Dx 250.00.  100 each  1  . Lancets (FREESTYLE) lancets Use to check blood sugar once a day.  Dx. 250.00.  100 each  1  . levothyroxine (SYNTHROID, LEVOTHROID) 50 MCG tablet TAKE ONE (1) TABLET EACH DAY BEFORE BREAKFAST  30 tablet  6  . loratadine (ALAVERT) 10 MG tablet Take 10 mg by mouth daily as needed for allergies.      . metFORMIN (GLUCOPHAGE-XR) 500 MG 24  hr tablet TAKE TWO TABLETS BY MOUTH DAILY WITH BREAKFAST  60 tablet  2  . Multiple Vitamin (MULTIVITAMIN WITH MINERALS) TABS tablet Take 1 tablet by mouth daily.      Glory Rosebush DELICA LANCETS 27C MISC       . tamsulosin (FLOMAX) 0.4 MG CAPS capsule TAKE ONE (1) CAPSULE EACH DAY  30 capsule  6  . zoster vaccine live, PF, (ZOSTAVAX) 62376 UNT/0.65ML injection Inject 19,400 Units into the skin once.  1 each  0   No current facility-administered medications on file prior to visit.    Allergies  Allergen Reactions  . Adhesive [Tape]   . Latex     rash  . Other Hives    Plastic shopping bags Metal staples: rash, swelling    Review of Systems  Review of Systems  Constitutional: Negative for fever and malaise/fatigue.  HENT: Negative for congestion.   Eyes: Negative for discharge.  Respiratory: Negative for shortness of breath.   Cardiovascular: Negative for chest pain, palpitations and leg swelling.  Gastrointestinal: Negative for nausea, abdominal pain and diarrhea.  Genitourinary: Negative for dysuria.  Musculoskeletal: Negative for falls.  Skin: Negative for rash.  Neurological: Negative for loss of consciousness and headaches.  Endo/Heme/Allergies: Negative for polydipsia.  Psychiatric/Behavioral: Negative for depression and suicidal ideas. The patient is not nervous/anxious and does not have insomnia.     Objective  BP 126/84  Pulse 71  Temp(Src) 98.2 F (36.8 C) (Oral)  Ht 6' (1.829 m)  Wt 229 lb 1.3 oz (103.91 kg)  BMI 31.06 kg/m2  SpO2 98%  Physical Exam  Physical Exam  Constitutional: He is oriented to person, place, and time and well-developed, well-nourished, and in no distress. No distress.  HENT:  Head: Normocephalic and atraumatic.  Eyes: Conjunctivae are normal.  Neck: Neck supple. No thyromegaly present.  Cardiovascular: Normal rate, regular rhythm and normal heart sounds.   No murmur heard. Pulmonary/Chest: Effort normal and breath sounds normal. No  respiratory distress.  Abdominal: He exhibits no distension and no mass. There is no tenderness.  Musculoskeletal: He exhibits no edema.  Neurological: He is alert and oriented to person, place, and time.  Skin: Skin is warm.  Psychiatric: Memory, affect and judgment normal.    Lab Results  Component Value Date   TSH 4.059 01/29/2014   Lab Results  Component Value Date  WBC 8.9 01/29/2014   HGB 12.6* 01/29/2014   HCT 37.4* 01/29/2014   MCV 87.6 01/29/2014   PLT 251 01/29/2014   Lab Results  Component Value Date   CREATININE 1.01 01/29/2014   BUN 24* 01/29/2014   NA 136 01/29/2014   K 4.7 01/29/2014   CL 104 01/29/2014   CO2 27 01/29/2014   Lab Results  Component Value Date   ALT 22 01/29/2014   AST 21 01/29/2014   ALKPHOS 60 01/29/2014   BILITOT 0.5 01/29/2014   Lab Results  Component Value Date   CHOL 128 01/29/2014   Lab Results  Component Value Date   HDL 42 01/29/2014   Lab Results  Component Value Date   LDLCALC 69 01/29/2014   Lab Results  Component Value Date   TRIG 85 01/29/2014   Lab Results  Component Value Date   CHOLHDL 3.0 01/29/2014     Assessment & Plan  CAD (coronary artery disease) Asymptomatic, no changes  Hypothyroidism On Levothyroxine, continue to monitor  DM (diabetes mellitus) hgba1c acceptable, minimize simple carbs. Increase exercise as tolerated. Continue current meds  Medicare annual wellness visit, subsequent Patient denies any difficulties at home. No trouble with ADLs, depression or falls. No recent changes to vision or hearing. Is UTD with immunizations. Is UTD with screening. Discussed Advanced Directives, patient agrees to bring [place] copies of documents if can. Encouraged heart healthy diet, exercise as tolerated and adequate sleep. Given flu shot today. Sees Dr Adele Barthel of Vascular Surgery Sees podiatry, Dr Caffie Pinto

## 2014-02-03 NOTE — Assessment & Plan Note (Signed)
hgba1c acceptable, minimize simple carbs. Increase exercise as tolerated. Continue current meds 

## 2014-02-03 NOTE — Assessment & Plan Note (Signed)
Asymptomatic, no changes

## 2014-02-03 NOTE — Assessment & Plan Note (Signed)
On Levothyroxine, continue to monitor 

## 2014-02-06 ENCOUNTER — Other Ambulatory Visit: Payer: Self-pay | Admitting: Family Medicine

## 2014-02-12 ENCOUNTER — Other Ambulatory Visit (HOSPITAL_COMMUNITY): Payer: Self-pay | Admitting: Cardiology

## 2014-02-12 DIAGNOSIS — I714 Abdominal aortic aneurysm, without rupture, unspecified: Secondary | ICD-10-CM

## 2014-02-16 ENCOUNTER — Other Ambulatory Visit: Payer: Self-pay | Admitting: Family Medicine

## 2014-02-21 ENCOUNTER — Ambulatory Visit (HOSPITAL_COMMUNITY): Payer: Medicare Other | Attending: Cardiology | Admitting: Cardiology

## 2014-02-21 DIAGNOSIS — E119 Type 2 diabetes mellitus without complications: Secondary | ICD-10-CM | POA: Diagnosis not present

## 2014-02-21 DIAGNOSIS — E785 Hyperlipidemia, unspecified: Secondary | ICD-10-CM | POA: Insufficient documentation

## 2014-02-21 DIAGNOSIS — Z87891 Personal history of nicotine dependence: Secondary | ICD-10-CM | POA: Insufficient documentation

## 2014-02-21 DIAGNOSIS — I2510 Atherosclerotic heart disease of native coronary artery without angina pectoris: Secondary | ICD-10-CM | POA: Diagnosis not present

## 2014-02-21 DIAGNOSIS — I714 Abdominal aortic aneurysm, without rupture, unspecified: Secondary | ICD-10-CM | POA: Diagnosis present

## 2014-02-21 DIAGNOSIS — I739 Peripheral vascular disease, unspecified: Secondary | ICD-10-CM | POA: Diagnosis not present

## 2014-02-21 NOTE — Progress Notes (Signed)
Abdominal aorta duplex performed

## 2014-03-26 LAB — HM DIABETES EYE EXAM

## 2014-03-29 ENCOUNTER — Ambulatory Visit (INDEPENDENT_AMBULATORY_CARE_PROVIDER_SITE_OTHER)
Admission: RE | Admit: 2014-03-29 | Discharge: 2014-03-29 | Disposition: A | Discharge status: Home / Self Care | Payer: Medicare Other | Source: Ambulatory Visit | Attending: Vascular Surgery | Admitting: Vascular Surgery

## 2014-03-29 ENCOUNTER — Ambulatory Visit (HOSPITAL_COMMUNITY)
Admission: RE | Admit: 2014-03-29 | Discharge: 2014-03-29 | Disposition: A | Discharge status: Home / Self Care | Payer: Medicare Other | Source: Ambulatory Visit | Attending: Vascular Surgery | Admitting: Vascular Surgery

## 2014-03-29 DIAGNOSIS — I70219 Atherosclerosis of native arteries of extremities with intermittent claudication, unspecified extremity: Secondary | ICD-10-CM

## 2014-04-04 ENCOUNTER — Encounter: Payer: Self-pay | Admitting: Vascular Surgery

## 2014-04-05 ENCOUNTER — Ambulatory Visit (INDEPENDENT_AMBULATORY_CARE_PROVIDER_SITE_OTHER): Payer: Medicare Other | Admitting: Vascular Surgery

## 2014-04-05 ENCOUNTER — Encounter: Payer: Self-pay | Admitting: Vascular Surgery

## 2014-04-05 VITALS — BP 131/70 | HR 65 | Resp 16 | Ht 72.0 in | Wt 225.0 lb

## 2014-04-05 DIAGNOSIS — I70219 Atherosclerosis of native arteries of extremities with intermittent claudication, unspecified extremity: Secondary | ICD-10-CM

## 2014-04-05 DIAGNOSIS — Z48812 Encounter for surgical aftercare following surgery on the circulatory system: Secondary | ICD-10-CM

## 2014-04-05 NOTE — Progress Notes (Signed)
Established Previous Bypass  History of Present Illness  Harry Brown is a 73 y.o. (08-02-1940) male who presents with chief complaint: none.  Previous operation(s) complete on 04/25/13 include: R CFA to AK pop BPG w/ NR ips GSV which were complicated with wound issues.  The patient's symptoms have not progressed.  The patient's symptoms are: none. The patient's treatment regimen currently included: maximal medical management .  The patient's PMH, PSH, SH, FamHx, Med, and Allergies are unchanged from 09/28/13  On ROS today: no intermittent claudication, no rest pain, no wound issues  Physical Examination  Filed Vitals:   04/05/14 1051  BP: 131/70  Pulse: 65  Resp: 16  Height: 6' (1.829 m)  Weight: 225 lb (102.059 kg)   Body mass index is 30.51 kg/(m^2).  Physical Examination  Filed Vitals:   04/05/14 1051  BP: 131/70  Pulse: 65  Resp: 16  Height: 6' (1.829 m)  Weight: 225 lb (102.059 kg)   Body mass index is 30.51 kg/(m^2).  General: A&O x 3, WDWN  Pulmonary: Sym exp, good air movt, CTAB, no rales, rhonchi, & wheezing  Cardiac: RRR, Nl S1, S2, no Murmurs, rubs or gallops  Vascular: Vessel Right Left  Radial Palpable Palpable  Brachial Palpable Palpable  Carotid Palpable, without bruit Palpable, without bruit  Aorta Not palpable N/A  Femoral Palpable Palpable  Popliteal Not palpable Not palpable  PT Palpable Palpable  DP Palpable Faintly palpable   Gastrointestinal: soft, NTND, -G/R, - HSM, - masses, - CVAT B  Musculoskeletal: M/S 5/5 throughout , Extremities without ischemic changes , R vein harvest incision healed  Neurologic: Pain and light touch intact in extremities , Motor exam as listed above  Non-Invasive Vascular Imaging ABI (Date: 04/05/2014) R: 1.08 (1.15), DP: bi, PT: bi, TBI: 0.72 L: 0.86 (0.91), DP: bi, PT: mono, TBI: 0.50  RLE arterial duplex (Date: 04/05/2014)  Patent fem-pop bypass  No hemodynamic stenosis seen  Medical  Decision Making  Harry Brown is a 73 y.o. male who presents with: s/p  R CFA to AK pop BPG w/ NR ips GSV    Based on the patient's vascular studies and examination, I have offered the patient: BLE ABI, RLE bypass duplex.  I discussed in depth with the patient the nature of atherosclerosis, and emphasized the importance of maximal medical management including strict control of blood pressure, blood glucose, and lipid levels, obtaining regular exercise, and cessation of smoking.  The patient is aware that without maximal medical management the underlying atherosclerotic disease process will progress, limiting the benefit of any interventions.  I discussed in depth with the patient the need for long term surveillance to improve the primary assisted patency of his bypass.  The patient agrees to cooperate with such.  The patient is scheduled for the previous listed surveillance studies in 6 months. The patient is currently on a statin: Lipitor. The patient is currently on an anti-platelet: ASA.  Thank you for allowing [place] to participate in this patient's care.  Adele Barthel, MD [facility] Office: [phone] Pager: [phone]  04/05/2014, 11:41 AM   VASCULAR QUALITY INITIATIVE FOLLOW UP DATA:  Current smoker: [  ] yes  [x  ] no  Living status: [x  ]  Home  [  ] Nursing home  [  ] Homeless    MEDS:  ASA [ x ] yes  [  ] no- [  ] medical reason  [  ] non  compliant  STATIN  [ x ] yes  [  ] no- [  ] medical reason  [  ] non compliant  Beta blocker [ x ] yes  [  ] no- [  ] medical reason  [  ] non compliant  ACE inhibitor [  ] yes  [  x] no- [  ] medical reason  [  ] non compliant  P2Y12 Antagonist [  x] none  [  ] clopidogrel-Plavix  [  ] ticlopidine-Ticlid   [  ] prasugrel-Effient  [  ] ticagrelor- Brilinta    Anticoagulant [ x ] None  [  ] warfarin  [  ] rivaroxaban-Xarelto [  ] dabigatran- Pradaxa  Ambulation: [x  ] Amb  [  ] Amb with  assistance  [  ] wheelchair  [  ] bedridden  Ipsilateral Sx: [  x] none   [  ] claudication  [  ] rest pain  [  ] tissue loss  Current Patency: [  x] primary  [  ] primary-assisted  [  ] secondary  [  ] occluded  Patency judged by: [ x ] doppler  [  ] palp graft pulse  [ x ] palp distal pulse   [  ] ABI increase > 15%   [ x ] Duplex  If occluded, when-   Ipsilateral ABI: 1.08  Ipsilateral TBI: 0.72  Infection: [x  ] none  [  ] cellulitis  [  ] deep abscess  [  ] infection of artery or graft  Bypass revision: [ x ] no  [  ] yes- [  ] surg  [  ] catheter based  [  ] both    Date:   Thrombectomy/ lysis/ revision:  [ x ] no  [  ] yes- [  ] surg  [  ] catheter based  [  ] both    Date:   Major amputation: [  x] no  [  ] minor amp  [  ] BKA  [  ] AKA   Date:

## 2014-04-05 NOTE — Addendum Note (Signed)
Addended by: Mena Goes on: 04/05/2014 03:30 PM   Modules accepted: Orders

## 2014-04-09 ENCOUNTER — Other Ambulatory Visit: Payer: Self-pay | Admitting: Family Medicine

## 2014-04-09 ENCOUNTER — Telehealth: Payer: Self-pay | Admitting: Family Medicine

## 2014-04-09 NOTE — Telephone Encounter (Signed)
OK to switch Finasteride 5 mg daily. But he could have increased dry mouth, lightheadedness. D/C Avodart, disp #30 with 2 rf of Finasteride

## 2014-04-09 NOTE — Telephone Encounter (Addendum)
Caller name: Jahlen, Bollman Relation to pt: self  Call back number: [phone] Pharmacy:  Reason for call:   [employer] Medicare advised pt insurance will no longer cover AVODART 0.5 MG capsule and advised pt to consult with PCP regarding Finasterid

## 2014-04-09 NOTE — Telephone Encounter (Signed)
Please advise 

## 2014-04-10 MED ORDER — FINASTERIDE 5 MG PO TABS: 5.0000 mg | ORAL_TABLET | Freq: Every day | ORAL | Status: DC

## 2014-04-10 NOTE — Telephone Encounter (Signed)
Called and informed the pt of Dr. Frederik Pear note below.  Pt understood and agreed.  New rx sent to the pharmacy by e-script.//AB/CMA

## 2014-04-22 ENCOUNTER — Other Ambulatory Visit: Payer: Self-pay | Admitting: Family Medicine

## 2014-05-10 ENCOUNTER — Other Ambulatory Visit: Payer: Self-pay | Admitting: Family Medicine

## 2014-05-16 ENCOUNTER — Encounter (HOSPITAL_COMMUNITY): Payer: Self-pay | Admitting: Vascular Surgery

## 2014-07-15 ENCOUNTER — Other Ambulatory Visit: Payer: Self-pay | Admitting: Family Medicine

## 2014-07-22 ENCOUNTER — Other Ambulatory Visit: Payer: Medicare Other

## 2014-07-24 ENCOUNTER — Other Ambulatory Visit (INDEPENDENT_AMBULATORY_CARE_PROVIDER_SITE_OTHER): Payer: Medicare Other

## 2014-07-24 DIAGNOSIS — E039 Hypothyroidism, unspecified: Secondary | ICD-10-CM

## 2014-07-24 DIAGNOSIS — E785 Hyperlipidemia, unspecified: Secondary | ICD-10-CM

## 2014-07-24 DIAGNOSIS — D649 Anemia, unspecified: Secondary | ICD-10-CM | POA: Diagnosis not present

## 2014-07-24 DIAGNOSIS — E119 Type 2 diabetes mellitus without complications: Secondary | ICD-10-CM | POA: Diagnosis not present

## 2014-07-24 LAB — RENAL FUNCTION PANEL
Albumin: 3.8 g/dL (ref 3.5–5.2)
BUN: 32 mg/dL — ABNORMAL HIGH (ref 6–23)
CO2: 26 mEq/L (ref 19–32)
Calcium: 9.4 mg/dL (ref 8.4–10.5)
Chloride: 105 mEq/L (ref 96–112)
Creatinine, Ser: 0.94 mg/dL (ref 0.40–1.50)
GFR: 83.23 mL/min (ref >60.00)
Glucose, Bld: 136 mg/dL — ABNORMAL HIGH (ref 70–99)
Phosphorus: 3.7 mg/dL (ref 2.3–4.6)
Potassium: 4.6 mEq/L (ref 3.5–5.1)
Sodium: 137 mEq/L (ref 135–145)

## 2014-07-24 LAB — LIPID PANEL
Cholesterol: 130 mg/dL (ref 0–200)
HDL: 40.7 mg/dL (ref >39.00)
LDL Cholesterol: 77 mg/dL (ref 0–99)
NonHDL: 89.3
Total CHOL/HDL Ratio: 3
Triglycerides: 62 mg/dL (ref 0.0–149.0)
VLDL: 12.4 mg/dL (ref 0.0–40.0)

## 2014-07-24 LAB — CBC
HCT: 39.3 % (ref 39.0–52.0)
Hemoglobin: 12.9 g/dL — ABNORMAL LOW (ref 13.0–17.0)
MCHC: 32.9 g/dL (ref 30.0–36.0)
MCV: 89.8 fl (ref 78.0–100.0)
Platelets: 223 10*3/uL (ref 150.0–400.0)
RBC: 4.38 Mil/uL (ref 4.22–5.81)
RDW: 13.8 % (ref 11.5–15.5)
WBC: 8.6 10*3/uL (ref 4.0–10.5)

## 2014-07-24 LAB — HEPATIC FUNCTION PANEL
ALT: 28 U/L (ref 0–53)
AST: 20 U/L (ref 0–37)
Albumin: 3.8 g/dL (ref 3.5–5.2)
Alkaline Phosphatase: 68 U/L (ref 39–117)
Bilirubin, Direct: 0.1 mg/dL (ref 0.0–0.3)
Total Bilirubin: 0.4 mg/dL (ref 0.2–1.2)
Total Protein: 6.7 g/dL (ref 6.0–8.3)

## 2014-07-24 LAB — HEMOGLOBIN A1C: Hgb A1c MFr Bld: 7.2 % — ABNORMAL HIGH (ref 4.6–6.5)

## 2014-07-24 LAB — TSH: TSH: 3.75 u[IU]/mL (ref 0.35–4.50)

## 2014-07-29 ENCOUNTER — Ambulatory Visit: Payer: Medicare Other | Admitting: Family Medicine

## 2014-08-08 ENCOUNTER — Ambulatory Visit: Payer: Self-pay | Admitting: Family Medicine

## 2014-08-12 ENCOUNTER — Ambulatory Visit (INDEPENDENT_AMBULATORY_CARE_PROVIDER_SITE_OTHER): Payer: Medicare Other | Admitting: Family Medicine

## 2014-08-12 ENCOUNTER — Encounter: Payer: Self-pay | Admitting: Family Medicine

## 2014-08-12 VITALS — BP 118/72 | HR 82 | Temp 98.7°F | Ht 72.0 in | Wt 229.0 lb

## 2014-08-12 DIAGNOSIS — E039 Hypothyroidism, unspecified: Secondary | ICD-10-CM

## 2014-08-12 DIAGNOSIS — E1169 Type 2 diabetes mellitus with other specified complication: Secondary | ICD-10-CM

## 2014-08-12 DIAGNOSIS — E119 Type 2 diabetes mellitus without complications: Secondary | ICD-10-CM

## 2014-08-12 DIAGNOSIS — I10 Essential (primary) hypertension: Secondary | ICD-10-CM | POA: Diagnosis not present

## 2014-08-12 DIAGNOSIS — N189 Chronic kidney disease, unspecified: Secondary | ICD-10-CM

## 2014-08-12 DIAGNOSIS — E782 Mixed hyperlipidemia: Secondary | ICD-10-CM | POA: Diagnosis not present

## 2014-08-12 DIAGNOSIS — E669 Obesity, unspecified: Secondary | ICD-10-CM

## 2014-08-12 DIAGNOSIS — E1122 Type 2 diabetes mellitus with diabetic chronic kidney disease: Secondary | ICD-10-CM

## 2014-08-12 DIAGNOSIS — D649 Anemia, unspecified: Secondary | ICD-10-CM

## 2014-08-12 DIAGNOSIS — H409 Unspecified glaucoma: Secondary | ICD-10-CM

## 2014-08-12 NOTE — Patient Instructions (Signed)
Release of Record Dr Leonarda Salon ([place]) of opthamology, last visit note  Dupuytren's Contracture Dupuytren's contracture affects the fingers and the palm of the hand. This condition usually develops slowly. It may take many years to develop. The pinky finger and the ring finger are most often affected. These fingers start to curve inward, like a claw. At some point, the fingers cannot go straight anymore. This can make it hard to do things like:  Put on gloves.  Shake hands.  Grab something off a shelf. The condition usually does not cause pain and is not dangerous. The condition gets its name from the doctor who came up with an operation to fix the problem. His name was Lanney Gins Dupuytren. Contracture means pulling inward. CAUSES  Dupuytren's contracture does not start with the fingers. It starts in the palm of the hand, under the skin. The tissue under the skin is called fascia. The fascia covers the cords (tendons) that control how the fingers move. In Dupuytren's contracture the fascia tissue becomes thick and then pulls on the cords. That causes the fingers to curl. The condition can affect both hands and any fingers, but it usually strikes one hand worse than the other. The fingers farthest from the thumb are most often the ones that curl. The cause is not clear. Some experts believe it results from an autoimmune reaction. That means the body's immune system (which fights off disease) attacks itself by mistake. What experts do know is that certain conditions and behaviors (called risk factors) make the chance of having this condition more likely. They include:  Age. Most people who have the condition are older than 50.  Sex. It affects men more often than women.  Family history. The condition tends to run in families from countries in [place] and [place].  Certain behaviors. People who smoke and drink alcohol are more apt to develop the problem.  Some other medical  conditions. Having diabetes makes Dupuytren's contracture more likely. So does having a condition that involves a seizure (when the brain's function is interrupted). SYMPTOMS  Signs of this condition take time to develop. Sometimes this takes weeks or months. More often, it takes several years.   Early symptoms:  Skin on the palm of the hand becomes thick. This is usually the first sign.  The skin may look dimpled or puckered.  Lumps (nodules) show up on the palm. There may be one or more lumps. They are not painful.  Later symptoms:  Thick cords of tissue form in the palm of the hand.  The pinky and ring fingers start to curl up into the palm.  The fingers cannot be straightened into their normal position. DIAGNOSIS  A physical examination is the main way that a healthcare provider can tell if you have Dupuytren's contracture. Other tests usually are not needed. The caregiver will probably:  Look at your hands. Feel your hands. This is to check for thickening and nodules.  Measure finger motion. This tells how much your fingers have contracted (pulled in).  Do a tabletop test. You will be asked to try to put your hand flat on a table, palm down. TREATMENT  There is no cure for Dupuytren's contracture. But there are ways to treat the symptoms. Options include:  Watching and waiting. The condition develops slowly. Often it does not create problems for a long time. Sometimes the skin gets thick and nodules form, but the fingers never curl. So, in some cases it is best  to just watch the condition carefully and wait to see what happens.  Shots (injections). Different substances may be injected, including:  Steroids. These drugs block swelling. These shots should make the condition less uncomfortable. Steroids may also slow down the condition. Shots are given into the nodules. The effect only lasts awhile. More shots may have to be given.  Enzymes. These are proteins. They weaken the  thick tissue. After an injection, the caregiver usually stretches the fingers.  Needling. A needle is pushed through the skin and into the thick tissue. This is done in several spots. The goal is to break up the thickened tissue. Or to weaken it.  Surgery. This may be suggested if you cannot grasp objects. Or, if you can no longer put your hand in your pocket.  A cut (incision) is made in the palm of the hand. The thick tissue is removed.  Sometimes the thick tissue is attached to the skin. Then, the skin must be removed, too. It is replaced with a piece of skin from another place on your body. That is called a skin graft.  Occupational or hand therapy is almost always needed after surgery. This involves special exercises to get back the use of your hand and fingers. After a skin graft, several months of therapy may be needed.  Sometimes the condition comes back, even after surgery.  Other methods. You can do some things on your own. They include:  Stretching the fingers backwards. Do this often.  Warming the hand and massaging it. Again, do this often.  Using tools with padded grips. This should make things easier.  Wearing heavy gloves while working. This protects the hands. PROGNOSIS  Dupuytren's contracture usually develops slowly. There is no cure. But, the symptoms can be treated. Sometimes they come back after treatment, but not always. It is important to remember that this is a functional problem and not a life-threatening condition. Document Released: 03/21/2009 Document Revised: 08/16/2011 Document Reviewed: 03/21/2009 [facility] Patient Information 2015 [place], [place]. This information is not intended to replace advice given to you by your health care provider. Make sure you discuss any questions you have with your health care provider.

## 2014-08-12 NOTE — Progress Notes (Signed)
Pre visit review using our clinic review tool, if applicable. No additional management support is needed unless otherwise documented below in the visit note. 

## 2014-08-18 ENCOUNTER — Encounter: Payer: Self-pay | Admitting: Family Medicine

## 2014-08-18 DIAGNOSIS — H409 Unspecified glaucoma: Secondary | ICD-10-CM

## 2014-08-18 HISTORY — DX: Unspecified glaucoma: H40.9

## 2014-08-18 NOTE — Assessment & Plan Note (Signed)
Patient follows with opthamology, Dr Leonarda Salon, no recent visual changes.

## 2014-08-18 NOTE — Assessment & Plan Note (Signed)
Mild, Increase leafy greens, consider increased lean red meat and using cast iron cookware. Continue to monitor, report any concerns 

## 2014-08-18 NOTE — Assessment & Plan Note (Signed)
hgba1c acceptable, minimize simple carbs. Increase exercise as tolerated. Continue current meds 

## 2014-08-18 NOTE — Progress Notes (Signed)
Harry Brown  [id number] 1941-05-02 08/18/2014      Progress Note-Follow Up  Subjective  Chief Complaint  Chief Complaint  Patient presents with  . Follow-up    HPI  Patient is a 74 y.o. male in today for routine medical care. Patient is in today for follow-up. Generally doing well. Does acknowledge she's had some infrequent episodes of feeling slightly lightheaded upon arising quickly. Sometimes resolve within seconds and he said no other neurologic symptoms or complaints associated. No true vertigo. No headaches. Denies CP/palp/SOB/HA/congestion/fevers/GI or GU c/o. Taking meds as prescribed  Past Medical History  Diagnosis Date  . Diverticulitis   . CAD (coronary artery disease)   . Hypothyroidism   . ED (erectile dysfunction)   . Other and unspecified hyperlipidemia   . Neck pain on left side 11/22/2012  . Urinary urgency 11/22/2012  . Dizziness   . Hypertension   . Myocardial infarction 11/05/1985  . History of sleep apnea     had surgery to correct  . Type II diabetes mellitus     fasting 100-120  . Peripheral vascular disease   . Cellulitis 05/14/2013    RLE  . Postoperative anemia due to acute blood loss 09/24/2013  . Anemia 09/24/2013  . Medicare annual wellness visit, subsequent 04/13/2013  . Glaucoma 08/18/2014    Past Surgical History  Procedure Laterality Date  . Coronary artery bypass graft  2001    "CABG X3" (05/14/2013)  . Palate / uvula biopsy / excision  2003    Removed due to sleep apnea  . Anterior cervical decomp/discectomy fusion  ~ 2000  . Abdominal aortagram  Oct. 9, 2014    Dr. Bridgett Larsson  . Tonsillectomy    . Dental surgery      "multiple teeth removed; trmimed top of mouth so dentures would fit" (05/14/2013)  . Cataract extraction Right   . Femoral-popliteal bypass graft Right 04/25/2013    Procedure: RIGHT FEMORAL TO ABOVE KNEE POPLITEAL BYPASS GRAFT WITH RIGHT GREATER SAPHENOUS VEIN HARVEST; ULTRASOUND GUIDED;  Surgeon: Conrad [place], MD;   Location: [facility] OR;  Service: Vascular;  Laterality: Right;  . Gum surgery  [phone]    "had bone scraped; had periodontal disease" (05/14/2013)  . Abdominal aortagram N/A 03/15/2013    Procedure: ABDOMINAL Maxcine Ham;  Surgeon: Conrad [place], MD;  Location: [facility] CATH LAB;  Service: Cardiovascular;  Laterality: N/A;  . Eye surgery      cataracts    Family History  Problem Relation Age of Onset  . Hyperlipidemia Mother   . Heart disease Mother   . Diabetes Mother   . Hypertension Mother   . Heart disease Father   . Asthma Father   . Arthritis Father   . Cancer Neg Hx   . Heart disease Sister   . Heart disease Sister     s/p bypass x 2  . Lupus Sister   . Heart disease Brother     MI    History   Social History  . Marital Status: Married    Spouse Name: N/A  . Number of Children: 8   . Years of Education: N/A   Occupational History  .     Social History Main Topics  . Smoking status: Former Smoker -- 1.50 packs/day for 50 years    Types: Cigarettes  . Smokeless tobacco: Never Used     Comment: 05/14/2013 "quit smoking in ~ 2004; still Uses Comit lozenges"  . Alcohol Use: No  .  Drug Use: No  . Sexual Activity: Not Currently     Comment: lives with wife, retiring end of next week, no dietary restrictions   Other Topics Concern  . Not on file   Social History Narrative    Current Outpatient Prescriptions on File Prior to Visit  Medication Sig Dispense Refill  . aspirin EC 81 MG tablet Take 81 mg by mouth at bedtime.    Marland Kitchen atenolol (TENORMIN) 25 MG tablet TAKE ONE (1) TABLET EACH DAY 30 tablet 6  . atorvastatin (LIPITOR) 40 MG tablet TAKE ONE (1) TABLET EACH DAY 30 tablet 3  . finasteride (PROSCAR) 5 MG tablet TAKE ONE (1) TABLET EACH DAY 30 tablet 6  . Lancets (FREESTYLE) lancets Use to check blood sugar once a day.  Dx. 250.00. 100 each 1  . levothyroxine (SYNTHROID, LEVOTHROID) 50 MCG tablet TAKE ONE (1) TABLET EACH DAY BEFORE BREAKFAST 30 tablet 6  . loratadine  (ALAVERT) 10 MG tablet Take 10 mg by mouth daily as needed for allergies.    . metFORMIN (GLUCOPHAGE-XR) 500 MG 24 hr tablet TAKE TWO TABLETS BY MOUTH DAILY WITH BREAKFAST 60 tablet 4  . Multiple Vitamin (MULTIVITAMIN WITH MINERALS) TABS tablet Take 1 tablet by mouth daily.    . ONE TOUCH ULTRA TEST test strip CHECK BLOOD SUGAR ONCE A DAY. 100 each 0  . ONETOUCH DELICA LANCETS 52D MISC     . tamsulosin (FLOMAX) 0.4 MG CAPS capsule TAKE ONE (1) CAPSULE EACH DAY 30 capsule 6  . zoster vaccine live, PF, (ZOSTAVAX) 78242 UNT/0.65ML injection Inject 19,400 Units into the skin once. 1 each 0   No current facility-administered medications on file prior to visit.    Allergies  Allergen Reactions  . Adhesive [Tape]   . Latex     rash  . Other Hives    Plastic shopping bags Metal staples: rash, swelling    Review of Systems  Review of Systems  Constitutional: Negative for fever, chills and malaise/fatigue.  HENT: Negative for congestion, hearing loss and nosebleeds.   Eyes: Negative for discharge.  Respiratory: Negative for cough, sputum production, shortness of breath and wheezing.   Cardiovascular: Negative for chest pain, palpitations and leg swelling.  Gastrointestinal: Negative for heartburn, nausea, vomiting, abdominal pain, diarrhea, constipation and blood in stool.  Genitourinary: Negative for dysuria, urgency, frequency and hematuria.  Musculoskeletal: Negative for myalgias, back pain and falls.  Skin: Negative for rash.  Neurological: Negative for dizziness, tremors, sensory change, focal weakness, loss of consciousness, weakness and headaches.       Reports being slightly light headed upon arising quickly, no falls or other associated symptoms  Endo/Heme/Allergies: Negative for polydipsia. Does not bruise/bleed easily.  Psychiatric/Behavioral: Negative for depression and suicidal ideas. The patient is not nervous/anxious and does not have insomnia.     Objective  BP 118/72  mmHg  Pulse 82  Temp(Src) 98.7 F (37.1 C) (Oral)  Ht 6' (1.829 m)  Wt 229 lb (103.874 kg)  BMI 31.05 kg/m2  SpO2 96%  Physical Exam  Physical Exam  Constitutional: He is oriented to person, place, and time and well-developed, well-nourished, and in no distress. No distress.  HENT:  Head: Normocephalic and atraumatic.  Eyes: Conjunctivae are normal.  Neck: Neck supple. No thyromegaly present.  Cardiovascular: Normal rate, regular rhythm and normal heart sounds.   No murmur heard. Pulmonary/Chest: Effort normal and breath sounds normal. No respiratory distress.  Abdominal: He exhibits no distension and no mass. There is no  tenderness.  Musculoskeletal: He exhibits no edema.  Neurological: He is alert and oriented to person, place, and time.  Skin: Skin is warm.  Psychiatric: Memory, affect and judgment normal.    Lab Results  Component Value Date   TSH 3.75 07/24/2014   Lab Results  Component Value Date   WBC 8.6 07/24/2014   HGB 12.9* 07/24/2014   HCT 39.3 07/24/2014   MCV 89.8 07/24/2014   PLT 223.0 07/24/2014   Lab Results  Component Value Date   CREATININE 0.94 07/24/2014   BUN 32* 07/24/2014   NA 137 07/24/2014   K 4.6 07/24/2014   CL 105 07/24/2014   CO2 26 07/24/2014   Lab Results  Component Value Date   ALT 28 07/24/2014   AST 20 07/24/2014   ALKPHOS 68 07/24/2014   BILITOT 0.4 07/24/2014   Lab Results  Component Value Date   CHOL 130 07/24/2014   Lab Results  Component Value Date   HDL 40.70 07/24/2014   Lab Results  Component Value Date   LDLCALC 77 07/24/2014   Lab Results  Component Value Date   TRIG 62.0 07/24/2014   Lab Results  Component Value Date   CHOLHDL 3 07/24/2014     Assessment & Plan  Hypothyroidism On Levothyroxine, continue to monitor   DM (diabetes mellitus) hgba1c acceptable, minimize simple carbs. Increase exercise as tolerated. Continue current meds   Anemia Mild, Increase leafy greens, consider  increased lean red meat and using cast iron cookware. Continue to monitor, report any concerns   Glaucoma Patient follows with opthamology, Dr Leonarda Salon, no recent visual changes.

## 2014-08-18 NOTE — Assessment & Plan Note (Signed)
On Levothyroxine, continue to monitor 

## 2014-08-19 ENCOUNTER — Other Ambulatory Visit: Payer: Self-pay | Admitting: Family Medicine

## 2014-08-19 DIAGNOSIS — H401232 Low-tension glaucoma, bilateral, moderate stage: Secondary | ICD-10-CM | POA: Diagnosis not present

## 2014-08-21 ENCOUNTER — Other Ambulatory Visit: Payer: Self-pay | Admitting: Family Medicine

## 2014-08-23 ENCOUNTER — Encounter: Payer: Self-pay | Admitting: Family Medicine

## 2014-08-31 ENCOUNTER — Other Ambulatory Visit: Payer: Self-pay | Admitting: Family Medicine

## 2014-09-04 ENCOUNTER — Other Ambulatory Visit: Payer: Self-pay | Admitting: Family Medicine

## 2014-09-16 DIAGNOSIS — H401232 Low-tension glaucoma, bilateral, moderate stage: Secondary | ICD-10-CM | POA: Diagnosis not present

## 2014-10-03 ENCOUNTER — Encounter: Payer: Self-pay | Admitting: Family

## 2014-10-04 ENCOUNTER — Encounter: Payer: Self-pay | Admitting: Family

## 2014-10-04 ENCOUNTER — Ambulatory Visit (INDEPENDENT_AMBULATORY_CARE_PROVIDER_SITE_OTHER): Payer: Medicare Other | Admitting: Family

## 2014-10-04 ENCOUNTER — Ambulatory Visit (INDEPENDENT_AMBULATORY_CARE_PROVIDER_SITE_OTHER)
Admission: RE | Admit: 2014-10-04 | Discharge: 2014-10-04 | Disposition: A | Discharge status: Home / Self Care | Payer: Medicare Other | Source: Ambulatory Visit | Attending: Family | Admitting: Family

## 2014-10-04 ENCOUNTER — Encounter (HOSPITAL_COMMUNITY): Payer: Medicare Other

## 2014-10-04 ENCOUNTER — Ambulatory Visit (HOSPITAL_COMMUNITY)
Admission: RE | Admit: 2014-10-04 | Discharge: 2014-10-04 | Disposition: A | Discharge status: Home / Self Care | Payer: Medicare Other | Source: Ambulatory Visit | Attending: Vascular Surgery | Admitting: Vascular Surgery

## 2014-10-04 ENCOUNTER — Other Ambulatory Visit (HOSPITAL_COMMUNITY): Payer: Medicare Other

## 2014-10-04 ENCOUNTER — Ambulatory Visit: Payer: Medicare Other | Admitting: Family

## 2014-10-04 VITALS — BP 123/72 | HR 62 | Resp 16 | Ht 72.0 in | Wt 225.0 lb

## 2014-10-04 DIAGNOSIS — I739 Peripheral vascular disease, unspecified: Secondary | ICD-10-CM

## 2014-10-04 DIAGNOSIS — I70219 Atherosclerosis of native arteries of extremities with intermittent claudication, unspecified extremity: Secondary | ICD-10-CM

## 2014-10-04 DIAGNOSIS — Z9889 Other specified postprocedural states: Secondary | ICD-10-CM

## 2014-10-04 DIAGNOSIS — Z95828 Presence of other vascular implants and grafts: Secondary | ICD-10-CM

## 2014-10-04 DIAGNOSIS — Z48812 Encounter for surgical aftercare following surgery on the circulatory system: Secondary | ICD-10-CM

## 2014-10-04 DIAGNOSIS — Z87891 Personal history of nicotine dependence: Secondary | ICD-10-CM | POA: Diagnosis not present

## 2014-10-04 NOTE — Addendum Note (Signed)
Addended by: Mena Goes on: 10/04/2014 03:46 PM   Modules accepted: Orders

## 2014-10-04 NOTE — Progress Notes (Addendum)
[facility] [place] HISTORY AND PHYSICAL -PAD  History of Present Illness Harry Brown is a 74 y.o. male patient of Dr. Bridgett Larsson who is s/p right femoropopliteal arterial bypass graft w/ NR ips GSV on 80/99/83; this was complicated with wound issues. He denies any claudication with walking, denies non healing wounds. He denies any history of stroke or TIA.   The patient reports New Medical or Surgical History: beginning stages of glaucoma; is newly retired   Pt Diabetic: Yes, in control Pt smoker: former smoker, quit in 2006  Pt meds include: Statin :Yes Betablocker: Yes ASA: Yes Other anticoagulants/antiplatelets: no  Past Medical History  Diagnosis Date  . Diverticulitis   . CAD (coronary artery disease)   . Hypothyroidism   . ED (erectile dysfunction)   . Other and unspecified hyperlipidemia   . Neck pain on left side 11/22/2012  . Urinary urgency 11/22/2012  . Dizziness   . Hypertension   . Myocardial infarction 11/05/1985  . History of sleep apnea     had surgery to correct  . Type II diabetes mellitus     fasting 100-120  . Peripheral vascular disease   . Cellulitis 05/14/2013    RLE  . Postoperative anemia due to acute blood loss 09/24/2013  . Anemia 09/24/2013  . Medicare annual wellness visit, subsequent 04/13/2013  . Glaucoma 08/18/2014    Social History History  Substance Use Topics  . Smoking status: Former Smoker -- 1.50 packs/day for 50 years    Types: Cigarettes  . Smokeless tobacco: Never Used     Comment: 05/14/2013 "quit smoking in ~ 2004; still Uses Comit lozenges"  . Alcohol Use: No    Family History Family History  Problem Relation Age of Onset  . Hyperlipidemia Mother   . Heart disease Mother   . Diabetes Mother   . Hypertension Mother   . Heart disease Father   . Asthma Father   . Arthritis Father   . Cancer Neg Hx   . Heart disease Sister   . Heart disease Sister     s/p bypass x 2  . Lupus Sister   . Heart  disease Brother     MI    Past Surgical History  Procedure Laterality Date  . Coronary artery bypass graft  2001    "CABG X3" (05/14/2013)  . Palate / uvula biopsy / excision  2003    Removed due to sleep apnea  . Anterior cervical decomp/discectomy fusion  ~ 2000  . Abdominal aortagram  Oct. 9, 2014    Dr. Bridgett Larsson  . Tonsillectomy    . Dental surgery      "multiple teeth removed; trmimed top of mouth so dentures would fit" (05/14/2013)  . Cataract extraction Right   . Femoral-popliteal bypass graft Right 04/25/2013    Procedure: RIGHT FEMORAL TO ABOVE KNEE POPLITEAL BYPASS GRAFT WITH RIGHT GREATER SAPHENOUS VEIN HARVEST; ULTRASOUND GUIDED;  Surgeon: Conrad Marienthal, MD;  Location[facility] OR;  Service: Vascular;  Laterality: Right;  . Gum surgery  [phone]    "had bone scraped; had periodontal disease" (05/14/2013)  . Abdominal aortagram N/A 03/15/2013    Procedure: ABDOMINAL Maxcine Ham;  Surgeon: Conrad [place], MD;  Location: [facility] CATH LAB;  Service: Cardiovascular;  Laterality: N/A;  . Eye surgery      cataracts    Allergies  Allergen Reactions  . Adhesive [Tape]   . Latex     rash  . Other Hives  Plastic shopping bags Metal staples: rash, swelling    Current Outpatient Prescriptions  Medication Sig Dispense Refill  . aspirin EC 81 MG tablet Take 81 mg by mouth at bedtime.    Marland Kitchen atenolol (TENORMIN) 25 MG tablet TAKE ONE (1) TABLET EACH DAY 30 tablet 6  . atorvastatin (LIPITOR) 40 MG tablet TAKE ONE (1) TABLET EACH DAY 30 tablet 6  . finasteride (PROSCAR) 5 MG tablet TAKE ONE (1) TABLET EACH DAY 30 tablet 6  . Lancets (FREESTYLE) lancets Use to check blood sugar once a day.  Dx. 250.00. 100 each 1  . latanoprost (XALATAN) 0.005 % ophthalmic solution Place into both eyes at bedtime.    Marland Kitchen levothyroxine (SYNTHROID, LEVOTHROID) 50 MCG tablet TAKE ONE (1) TABLET EACH DAY BEFORE BREAKFAST 30 tablet 1  . loratadine (ALAVERT) 10 MG tablet Take 10 mg by mouth daily as needed for allergies.    .  metFORMIN (GLUCOPHAGE-XR) 500 MG 24 hr tablet TAKE TWO TABLETS BY MOUTH DAILY WITH BREAKFAST 60 tablet 4  . Multiple Vitamin (MULTIVITAMIN WITH MINERALS) TABS tablet Take 1 tablet by mouth daily.    . ONE TOUCH ULTRA TEST test strip USE TO CHECK BLOOD SUGAR ONCE DAILY 100 each 0  . ONETOUCH DELICA LANCETS 22L MISC     . zoster vaccine live, PF, (ZOSTAVAX) 79892 UNT/0.65ML injection Inject 19,400 Units into the skin once. 1 each 0  . tamsulosin (FLOMAX) 0.4 MG CAPS capsule TAKE ONE (1) CAPSULE EACH DAY (Patient not taking: Reported on 10/04/2014) 30 capsule 6   No current facility-administered medications for this visit.    ROS: See HPI for pertinent positives and negatives.   Physical Examination  Filed Vitals:   10/04/14 1401  BP: 123/72  Pulse: 62  Resp: 16  Height: 6' (1.829 m)  Weight: 225 lb (102.059 kg)  SpO2: 98%   Body mass index is 30.51 kg/(m^2).  General: A&O x 3, WDWN  Pulmonary: Sym exp, good air movt, CTAB, no rales, rhonchi, & wheezing  Cardiac: RRR, Nl S1, S2, no detected murmurs  Vascular: Vessel Right Left  Radial Palpable Palpable  Carotid Palpable, without bruit Palpable, without bruit  Aorta Not palpable N/A  Femoral Palpable Not Palpable  Popliteal Not palpable Not palpable  PT 2+Palpable Not Palpable  DP Not Palpable Faintly palpable   Gastrointestinal: soft, NTND, -G/R, - HSM, - palpable masses, - CVAT B  Musculoskeletal: M/S 5/5 throughout , Extremities without ischemic changes , R vein harvest incision healed  Neurologic: Pain and light touch intact in extremities , Motor exam as listed above         Non-Invasive Vascular Imaging: DATE: 10/04/2014 LOWER EXTREMITY ARTERIAL DUPLEX EVALUATION    INDICATION: Follow-up right lower extremity graft     PREVIOUS INTERVENTION(S): Right femoropopliteal arterial bypass graft placed 04/25/2013    DUPLEX EXAM:     RIGHT  LEFT   Peak Systolic Velocity (cm/s) Ratio (if  abnormal) Waveform  Peak Systolic Velocity (cm/s) Ratio (if abnormal) Waveform  111  T Inflow Artery     125  T Proximal Anastomosis     86  T Proximal Graft     99  T Mid Graft     109  T  Distal Graft     117  T Distal Anastomosis     108  T Outflow Artery     1.10 Today's ABI / TBI Unreliable  1.08 Previous ABI / TBI (03/29/2014 ) 0.86    Waveform:  M - Monophasic       B - Biphasic       T - Triphasic  If Ankle Brachial Index (ABI) or Toe Brachial Index (TBI) performed, please see complete report  ADDITIONAL FINDINGS:     IMPRESSION: Widely patent right femoropopliteal arterial bypass graft without evidence of restenosis or hyperplasia.     Compared to the previous exam:  No significant change compared to prior exam.      ASSESSMENT: Harry Brown is a 74 y.o. male who is s/p right femoropopliteal arterial bypass graft w/ NR ips GSV on 04/25/13. He no longer has claudication symptoms, no tissue loss in his lower extremities. Today's right LE arterial Duplex suggests a widely patent right femoropopliteal arterial bypass graft without evidence of restenosis or hyperplasia. No significant change compared to prior exam. Right LE ABI's remain normal with all triphasic waveforms. Left LE ABI does not correlate with the monophasic tibial artery waveforms indicating the presence of medial calcification.  Right TBI is normal, left TBI is below normal at 0.46.   PLAN:  Graduated walking program discussed in detail.  I discussed in depth with the patient the nature of atherosclerosis, and emphasized the importance of maximal medical management including strict control of blood pressure, blood glucose, and lipid levels, obtaining regular exercise, and continued cessation of smoking.  The patient is aware that without maximal medical management the underlying atherosclerotic disease process will progress, limiting the benefit of any interventions.  Based on the patient's vascular  studies and examination, pt will return to clinic in 6 months with ABI's and right LE arterial Duplex.   The patient was given information about PAD including signs, symptoms, treatment, what symptoms should prompt the patient to seek immediate medical care, and risk reduction measures to take.  Clemon Chambers, RN, MSN, FNP-C [facility] Phone: [phone]  Clinic MD: Bridgett Larsson  10/04/2014 2:08 PM   VASCULAR QUALITY INITIATIVE FOLLOW UP DATA:  Current smoker: [  ] yes  [ x ] no  Living status: [ x ]  Home  [  ] Nursing home  [  ] Homeless    MEDS:  ASA [ x ] yes  [  ] no- [  ] medical reason  [  ] non compliant  STATIN  [ x ] yes  [  ] no- [  ] medical reason  [  ] non compliant  Beta blocker [x  ] yes  [  ] no- [  ] medical reason  [  ] non compliant  ACE inhibitor [  ] yes  [ x ] no- [  ] medical reason  [  ] non compliant  P2Y12 Antagonist [x  ] none  [  ] clopidogrel-Plavix  [  ] ticlopidine-Ticlid   [  ] prasugrel-Effient  [  ] ticagrelor- Brilinta    Anticoagulant [ x ] None  [  ] warfarin  [  ] rivaroxaban-Xarelto [  ] dabigatran- Pradaxa  Ambulation: [ x ] Amb  [  ] Amb with assistance  [  ] wheelchair  [  ] bedridden  Ipsilateral Sx: '[x]'$  none   [  ] claudication  [  ] rest pain  [  ] tissue loss  Current Patency: [x  ] primary  [  ] primary-assisted  [  ] secondary  [  ] occluded  Patency judged by: [  ] doppler  [  ] palp graft  pulse  [ x ] palp distal pulse   [  ] ABI increase > 15%   [  ] Duplex  If occluded, when-   Ipsilateral ABI: 1.10  Ipsilateral TBI: 0.70  Infection: [  ] none  [  ] cellulitis  [ x ] deep abscess  [  ] infection of artery or graft  Bypass revision: [ x ] no  [  ] yes- [  ] surg  [  ] catheter based  [  ] both    Date:   Thrombectomy/ lysis/ revision:  [  x] no  [  ] yes- [  ] surg  [  ] catheter based  [  ] both    Date:   Major amputation: [ x ] no  [  ] minor amp  [  ] BKA  [  ] AKA   Date:

## 2014-10-04 NOTE — Patient Instructions (Signed)

## 2014-10-04 NOTE — Addendum Note (Signed)
Addended by: Mena Goes on: 10/04/2014 03:23 PM   Modules accepted: Orders

## 2014-10-28 ENCOUNTER — Other Ambulatory Visit: Payer: Self-pay | Admitting: Family Medicine

## 2014-11-18 ENCOUNTER — Other Ambulatory Visit: Payer: Self-pay | Admitting: Family Medicine

## 2015-01-13 ENCOUNTER — Other Ambulatory Visit: Payer: Self-pay | Admitting: Family Medicine

## 2015-01-14 DIAGNOSIS — H2512 Age-related nuclear cataract, left eye: Secondary | ICD-10-CM | POA: Diagnosis not present

## 2015-01-14 DIAGNOSIS — Z961 Presence of intraocular lens: Secondary | ICD-10-CM | POA: Diagnosis not present

## 2015-01-14 DIAGNOSIS — H401221 Low-tension glaucoma, left eye, mild stage: Secondary | ICD-10-CM | POA: Diagnosis not present

## 2015-02-03 ENCOUNTER — Other Ambulatory Visit: Payer: Self-pay | Admitting: Family Medicine

## 2015-02-10 ENCOUNTER — Other Ambulatory Visit: Payer: Self-pay | Admitting: Family Medicine

## 2015-02-14 ENCOUNTER — Other Ambulatory Visit (INDEPENDENT_AMBULATORY_CARE_PROVIDER_SITE_OTHER): Payer: Medicare Other

## 2015-02-14 DIAGNOSIS — E119 Type 2 diabetes mellitus without complications: Secondary | ICD-10-CM | POA: Diagnosis not present

## 2015-02-14 DIAGNOSIS — E782 Mixed hyperlipidemia: Secondary | ICD-10-CM | POA: Diagnosis not present

## 2015-02-14 DIAGNOSIS — E1169 Type 2 diabetes mellitus with other specified complication: Secondary | ICD-10-CM

## 2015-02-14 DIAGNOSIS — I10 Essential (primary) hypertension: Secondary | ICD-10-CM

## 2015-02-14 DIAGNOSIS — E669 Obesity, unspecified: Secondary | ICD-10-CM

## 2015-02-14 LAB — LIPID PANEL
Cholesterol: 125 mg/dL (ref 0–200)
HDL: 42.8 mg/dL (ref >39.00)
LDL Cholesterol: 66 mg/dL (ref 0–99)
NonHDL: 82.33
Total CHOL/HDL Ratio: 3
Triglycerides: 83 mg/dL (ref 0.0–149.0)
VLDL: 16.6 mg/dL (ref 0.0–40.0)

## 2015-02-14 LAB — CBC
HCT: 39.9 % (ref 39.0–52.0)
Hemoglobin: 13.1 g/dL (ref 13.0–17.0)
MCHC: 32.8 g/dL (ref 30.0–36.0)
MCV: 91.5 fl (ref 78.0–100.0)
Platelets: 233 10*3/uL (ref 150.0–400.0)
RBC: 4.36 Mil/uL (ref 4.22–5.81)
RDW: 14 % (ref 11.5–15.5)
WBC: 9.7 10*3/uL (ref 4.0–10.5)

## 2015-02-14 LAB — COMPREHENSIVE METABOLIC PANEL
ALT: 20 U/L (ref 0–53)
AST: 19 U/L (ref 0–37)
Albumin: 3.9 g/dL (ref 3.5–5.2)
Alkaline Phosphatase: 58 U/L (ref 39–117)
BUN: 35 mg/dL — ABNORMAL HIGH (ref 6–23)
CO2: 22 mEq/L (ref 19–32)
Calcium: 9.1 mg/dL (ref 8.4–10.5)
Chloride: 104 mEq/L (ref 96–112)
Creatinine, Ser: 1 mg/dL (ref 0.40–1.50)
GFR: 77.38 mL/min (ref >60.00)
Glucose, Bld: 124 mg/dL — ABNORMAL HIGH (ref 70–99)
Potassium: 4.2 mEq/L (ref 3.5–5.1)
Sodium: 137 mEq/L (ref 135–145)
Total Bilirubin: 0.5 mg/dL (ref 0.2–1.2)
Total Protein: 7 g/dL (ref 6.0–8.3)

## 2015-02-14 LAB — TSH: TSH: 4.64 u[IU]/mL — ABNORMAL HIGH (ref 0.35–4.50)

## 2015-02-14 LAB — HEMOGLOBIN A1C: Hgb A1c MFr Bld: 6.9 % — ABNORMAL HIGH (ref 4.6–6.5)

## 2015-02-17 ENCOUNTER — Encounter: Payer: Self-pay | Admitting: *Deleted

## 2015-02-21 ENCOUNTER — Encounter: Payer: Self-pay | Admitting: *Deleted

## 2015-02-21 ENCOUNTER — Telehealth: Payer: Self-pay | Admitting: *Deleted

## 2015-02-21 NOTE — Telephone Encounter (Signed)
Pre-Visit Call completed with patient and chart updated.   Pre-Visit Info documented in Specialty Comments under [employer].    

## 2015-02-24 ENCOUNTER — Ambulatory Visit (INDEPENDENT_AMBULATORY_CARE_PROVIDER_SITE_OTHER): Payer: Medicare Other | Admitting: Family Medicine

## 2015-02-24 ENCOUNTER — Encounter: Payer: Self-pay | Admitting: Family Medicine

## 2015-02-24 ENCOUNTER — Other Ambulatory Visit: Payer: Self-pay | Admitting: Cardiology

## 2015-02-24 VITALS — BP 120/80 | HR 72 | Temp 98.6°F | Ht 72.0 in | Wt 227.0 lb

## 2015-02-24 DIAGNOSIS — N189 Chronic kidney disease, unspecified: Secondary | ICD-10-CM | POA: Diagnosis not present

## 2015-02-24 DIAGNOSIS — D649 Anemia, unspecified: Secondary | ICD-10-CM

## 2015-02-24 DIAGNOSIS — E039 Hypothyroidism, unspecified: Secondary | ICD-10-CM

## 2015-02-24 DIAGNOSIS — N489 Disorder of penis, unspecified: Secondary | ICD-10-CM | POA: Insufficient documentation

## 2015-02-24 DIAGNOSIS — E1122 Type 2 diabetes mellitus with diabetic chronic kidney disease: Secondary | ICD-10-CM

## 2015-02-24 DIAGNOSIS — E785 Hyperlipidemia, unspecified: Secondary | ICD-10-CM

## 2015-02-24 DIAGNOSIS — N4889 Other specified disorders of penis: Secondary | ICD-10-CM | POA: Diagnosis not present

## 2015-02-24 DIAGNOSIS — Z0000 Encounter for general adult medical examination without abnormal findings: Secondary | ICD-10-CM | POA: Diagnosis not present

## 2015-02-24 DIAGNOSIS — Z23 Encounter for immunization: Secondary | ICD-10-CM | POA: Diagnosis not present

## 2015-02-24 DIAGNOSIS — I714 Abdominal aortic aneurysm, without rupture, unspecified: Secondary | ICD-10-CM

## 2015-02-24 HISTORY — DX: Disorder of penis, unspecified: N48.9

## 2015-02-24 LAB — MICROALBUMIN / CREATININE URINE RATIO
Creatinine,U: 113.2 mg/dL
Microalb Creat Ratio: 0.6 mg/g (ref 0.0–30.0)
Microalb, Ur: 0.7 mg/dL (ref 0.0–1.9)

## 2015-02-24 MED ORDER — ZOSTER VACCINE LIVE 19400 UNT/0.65ML ~~LOC~~ SOLR: 0.6500 mL | Freq: Once | SUBCUTANEOUS | Status: DC

## 2015-02-24 MED ORDER — FLUCONAZOLE 150 MG PO TABS: 150.0000 mg | ORAL_TABLET | Freq: Every day | ORAL | Status: DC

## 2015-02-24 NOTE — Progress Notes (Deleted)
Patient ID: Harry Brown, male   DOB: [birth year], 74 y.o.   MRN: [id number]   Harry Brown  male [id number] Dec 14, 1940 74 y.o. 02/24/2015      Progress Note-Follow Up  Subjective   HPI  Patient is in today for  Chief Complaint  Patient presents with  . Medicare Wellness    Past Medical History  Diagnosis Date  . Diverticulitis   . CAD (coronary artery disease)   . Hypothyroidism   . ED (erectile dysfunction)   . Other and unspecified hyperlipidemia   . Neck pain on left side 11/22/2012  . Urinary urgency 11/22/2012  . Dizziness   . Hypertension   . Myocardial infarction 11/05/1985  . History of sleep apnea     had surgery to correct  . Type II diabetes mellitus     fasting 100-120  . Peripheral vascular disease   . Cellulitis 05/14/2013    RLE  . Postoperative anemia due to acute blood loss 09/24/2013  . Anemia 09/24/2013  . Medicare annual wellness visit, subsequent 04/13/2013  . Glaucoma 08/18/2014    Past Surgical History  Procedure Laterality Date  . Coronary artery bypass graft  2001    "CABG X3" (05/14/2013)  . Palate / uvula biopsy / excision  2003    Removed due to sleep apnea  . Anterior cervical decomp/discectomy fusion  ~ 2000  . Abdominal aortagram  Oct. 9, 2014    Dr. Bridgett Larsson  . Tonsillectomy    . Dental surgery      "multiple teeth removed; trmimed top of mouth so dentures would fit" (05/14/2013)  . Cataract extraction Right   . Femoral-popliteal bypass graft Right 04/25/2013    Procedure: RIGHT FEMORAL TO ABOVE KNEE POPLITEAL BYPASS GRAFT WITH RIGHT GREATER SAPHENOUS VEIN HARVEST; ULTRASOUND GUIDED;  Surgeon: Conrad Atwood, MD;  Location: [facility] OR;  Service: Vascular;  Laterality: Right;  . Gum surgery  [phone]    "had bone scraped; had periodontal disease" (05/14/2013)  . Abdominal aortagram N/A 03/15/2013    Procedure: ABDOMINAL Maxcine Ham;  Surgeon: Conrad Lander, MD;  Location: [facility] CATH LAB;  Service: Cardiovascular;  Laterality: N/A;  . Eye surgery        cataracts    Family History  Problem Relation Age of Onset  . Hyperlipidemia Mother   . Heart disease Mother   . Diabetes Mother   . Hypertension Mother   . Heart disease Father   . Asthma Father   . Arthritis Father   . Cancer Neg Hx   . Heart disease Sister   . Heart disease Sister     s/p bypass x 2  . Lupus Sister   . Heart disease Brother     MI    Social History   Social History  . Marital Status: Married    Spouse Name: N/A  . Number of Children: 8   . Years of Education: N/A   Occupational History  .     Social History Main Topics  . Smoking status: Former Smoker -- 1.50 packs/day for 50 years    Types: Cigarettes  . Smokeless tobacco: Never Used     Comment: 05/14/2013 "quit smoking in ~ 2004; still Uses Comit lozenges"  . Alcohol Use: No  . Drug Use: No  . Sexual Activity: Not Currently     Comment: lives with wife, retiring end of next week, no dietary restrictions   Other Topics Concern  . Not on file  Social History Narrative    Current Outpatient Prescriptions on File Prior to Visit  Medication Sig Dispense Refill  . aspirin EC 81 MG tablet Take 81 mg by mouth at bedtime.    Marland Kitchen atenolol (TENORMIN) 25 MG tablet TAKE ONE (1) TABLET EACH DAY 30 tablet 5  . atorvastatin (LIPITOR) 40 MG tablet TAKE ONE (1) TABLET EACH DAY 30 tablet 6  . finasteride (PROSCAR) 5 MG tablet TAKE ONE (1) TABLET EACH DAY 30 tablet 3  . Lancets (FREESTYLE) lancets Use to check blood sugar once a day.  Dx. 250.00. 100 each 1  . latanoprost (XALATAN) 0.005 % ophthalmic solution Place into both eyes at bedtime.    Marland Kitchen levothyroxine (SYNTHROID, LEVOTHROID) 50 MCG tablet TAKE ONE (1) TABLET EACH DAY BEFORE BREAKFAST 30 tablet 6  . loratadine (ALAVERT) 10 MG tablet Take 10 mg by mouth daily as needed for allergies.    . metFORMIN (GLUCOPHAGE-XR) 500 MG 24 hr tablet TAKE TWO TABLETS BY MOUTH DAILY WITH BREAKFAST 60 tablet 3  . Multiple Vitamin (MULTIVITAMIN WITH MINERALS) TABS  tablet Take 1 tablet by mouth daily.    . ONE TOUCH ULTRA TEST test strip USE TO CHECK BLOOD SUGAR ONCE DAILY 100 each 6  . ONETOUCH DELICA LANCETS 86V MISC     . tamsulosin (FLOMAX) 0.4 MG CAPS capsule TAKE ONE (1) CAPSULE EACH DAY 30 capsule 6  . zoster vaccine live, PF, (ZOSTAVAX) 78469 UNT/0.65ML injection Inject 19,400 Units into the skin once. 1 each 0   No current facility-administered medications on file prior to visit.    Allergies  Allergen Reactions  . Adhesive [Tape]   . Latex     rash  . Other Hives    Plastic shopping bags Metal staples: rash, swelling    Review of Systems  Review of Systems  Constitutional: Negative for fever and malaise/fatigue.  HENT: Negative for congestion.   Eyes: Negative for discharge.  Respiratory: Negative for shortness of breath.   Cardiovascular: Negative for chest pain, palpitations and leg swelling.  Gastrointestinal: Negative for nausea and abdominal pain.  Genitourinary: Negative for dysuria.  Musculoskeletal: Negative for falls.  Skin: Negative for rash.  Neurological: Negative for loss of consciousness and headaches.  Endo/Heme/Allergies: Negative for environmental allergies.  Psychiatric/Behavioral: Negative for depression. The patient is not nervous/anxious.     Objective  Filed Vitals:   02/24/15 0836  BP: 120/80  Pulse: 72  Temp: 98.6 F (37 C)  TempSrc: Oral  Height: 6' (1.829 m)  Weight: 227 lb (102.967 kg)  SpO2: 98%   Body mass index is 30.78 kg/(m^2).  Physical Exam  Physical Exam  Lab Results  Component Value Date   TSH 4.64* 02/14/2015   Lab Results  Component Value Date   WBC 9.7 02/14/2015   HGB 13.1 02/14/2015   HCT 39.9 02/14/2015   MCV 91.5 02/14/2015   PLT 233.0 02/14/2015   Lab Results  Component Value Date   GFR 77.38 02/14/2015   Lab Results  Component Value Date   CHOL 125 02/14/2015   Lab Results  Component Value Date   HDL 42.80 02/14/2015   Lab Results  Component  Value Date   LDLCALC 66 02/14/2015   Lab Results  Component Value Date   TRIG 83.0 02/14/2015   Lab Results  Component Value Date   CHOLHDL 3 02/14/2015   Lab Results  Component Value Date   HGBA1C 6.9* 02/14/2015      Assessment & Plan  1.  Need for influenza vaccination *** - Flu vaccine HIGH DOSE PF (Fluzone High dose)

## 2015-02-24 NOTE — Assessment & Plan Note (Signed)
On Levothyroxine, continue to monitor. TSH mildly elevated, asymptomatic will recheck in 3 months

## 2015-02-24 NOTE — Patient Instructions (Addendum)
Swimmers ear drops found over the counter. Place one drop in each ear after the shower. Witch hazel apply to irritated area twice a day to get rid of itching Soak fungal toe in distilled white vinegar and warm water for 10-15 mins and then apply Vicks vapor rub under toenail to get rid of fungus   Preventive Care for Adults A healthy lifestyle and preventive care can promote health and wellness. Preventive health guidelines for men include the following key practices:  A routine yearly physical is a good way to check with your health care provider about your health and preventative screening. It is a chance to share any concerns and updates on your health and to receive a thorough exam.  Visit your dentist for a routine exam and preventative care every 6 months. Brush your teeth twice a day and floss once a day. Good oral hygiene prevents tooth decay and gum disease.  The frequency of eye exams is based on your age, health, family medical history, use of contact lenses, and other factors. Follow your health care provider's recommendations for frequency of eye exams.  Eat a healthy diet. Foods such as vegetables, fruits, whole grains, low-fat dairy products, and lean protein foods contain the nutrients you need without too many calories. Decrease your intake of foods high in solid fats, added sugars, and salt. Eat the right amount of calories for you.Get information about a proper diet from your health care provider, if necessary.  Regular physical exercise is one of the most important things you can do for your health. Most adults should get at least 150 minutes of moderate-intensity exercise (any activity that increases your heart rate and causes you to sweat) each week. In addition, most adults need muscle-strengthening exercises on 2 or more days a week.  Maintain a healthy weight. The body mass index (BMI) is a screening tool to identify possible weight problems. It provides an estimate of body  fat based on height and weight. Your health care provider can find your BMI and can help you achieve or maintain a healthy weight.For adults 20 years and older:  A BMI below 18.5 is considered underweight.  A BMI of 18.5 to 24.9 is normal.  A BMI of 25 to 29.9 is considered overweight.  A BMI of 30 and above is considered obese.  Maintain normal blood lipids and cholesterol levels by exercising and minimizing your intake of saturated fat. Eat a balanced diet with plenty of fruit and vegetables. Blood tests for lipids and cholesterol should begin at age 71 and be repeated every 5 years. If your lipid or cholesterol levels are high, you are over 50, or you are at high risk for heart disease, you may need your cholesterol levels checked more frequently.Ongoing high lipid and cholesterol levels should be treated with medicines if diet and exercise are not working.  If you smoke, find out from your health care provider how to quit. If you do not use tobacco, do not start.  Lung cancer screening is recommended for adults aged 29-80 years who are at high risk for developing lung cancer because of a history of smoking. A yearly low-dose CT scan of the lungs is recommended for people who have at least a 30-pack-year history of smoking and are a current smoker or have quit within the past 15 years. A pack year of smoking is smoking an average of 1 pack of cigarettes a day for 1 year (for example: 1 pack a day for  30 years or 2 packs a day for 15 years). Yearly screening should continue until the smoker has stopped smoking for at least 15 years. Yearly screening should be stopped for people who develop a health problem that would prevent them from having lung cancer treatment.  If you choose to drink alcohol, do not have more than 2 drinks per day. One drink is considered to be 12 ounces (355 mL) of beer, 5 ounces (148 mL) of wine, or 1.5 ounces (44 mL) of liquor.  Avoid use of street drugs. Do not share  needles with anyone. Ask for help if you need support or instructions about stopping the use of drugs.  High blood pressure causes heart disease and increases the risk of stroke. Your blood pressure should be checked at least every 1-2 years. Ongoing high blood pressure should be treated with medicines, if weight loss and exercise are not effective.  If you are 18-65 years old, ask your health care provider if you should take aspirin to prevent heart disease.  Diabetes screening involves taking a blood sample to check your fasting blood sugar level. This should be done once every 3 years, after age 57, if you are within normal weight and without risk factors for diabetes. Testing should be considered at a younger age or be carried out more frequently if you are overweight and have at least 1 risk factor for diabetes.  Colorectal cancer can be detected and often prevented. Most routine colorectal cancer screening begins at the age of 67 and continues through age 47. However, your health care provider may recommend screening at an earlier age if you have risk factors for colon cancer. On a yearly basis, your health care provider may provide home test kits to check for hidden blood in the stool. Use of a small camera at the end of a tube to directly examine the colon (sigmoidoscopy or colonoscopy) can detect the earliest forms of colorectal cancer. Talk to your health care provider about this at age 34, when routine screening begins. Direct exam of the colon should be repeated every 5-10 years through age 22, unless early forms of precancerous polyps or small growths are found.  People who are at an increased risk for hepatitis B should be screened for this virus. You are considered at high risk for hepatitis B if:  You were born in a country where hepatitis B occurs often. Talk with your health care provider about which countries are considered high risk.  Your parents were born in a high-risk country  and you have not received a shot to protect against hepatitis B (hepatitis B vaccine).  You have HIV or AIDS.  You use needles to inject street drugs.  You live with, or have sex with, someone who has hepatitis B.  You are a man who has sex with other men (MSM).  You get hemodialysis treatment.  You take certain medicines for conditions such as cancer, organ transplantation, and autoimmune conditions.  Hepatitis C blood testing is recommended for all people born from [birth year] through 1965 and any individual with known risks for hepatitis C.  Practice safe sex. Use condoms and avoid high-risk sexual practices to reduce the spread of sexually transmitted infections (STIs). STIs include gonorrhea, chlamydia, syphilis, trichomonas, herpes, HPV, and human immunodeficiency virus (HIV). Herpes, HIV, and HPV are viral illnesses that have no cure. They can result in disability, cancer, and death.  If you are at risk of being infected with HIV, it is  recommended that you take a prescription medicine daily to prevent HIV infection. This is called preexposure prophylaxis (PrEP). You are considered at risk if:  You are a man who has sex with other men (MSM) and have other risk factors.  You are a heterosexual man, are sexually active, and are at increased risk for HIV infection.  You take drugs by injection.  You are sexually active with a partner who has HIV.  Talk with your health care provider about whether you are at high risk of being infected with HIV. If you choose to begin PrEP, you should first be tested for HIV. You should then be tested every 3 months for as long as you are taking PrEP.  A one-time screening for abdominal aortic aneurysm (AAA) and surgical repair of large AAAs by ultrasound are recommended for men ages 41 to 74 years who are current or former smokers.  Healthy men should no longer receive prostate-specific antigen (PSA) blood tests as part of routine cancer screening. Talk  with your health care provider about prostate cancer screening.  Testicular cancer screening is not recommended for adult males who have no symptoms. Screening includes self-exam, a health care provider exam, and other screening tests. Consult with your health care provider about any symptoms you have or any concerns you have about testicular cancer.  Use sunscreen. Apply sunscreen liberally and repeatedly throughout the day. You should seek shade when your shadow is shorter than you. Protect yourself by wearing long sleeves, pants, a wide-brimmed hat, and sunglasses year round, whenever you are outdoors.  Once a month, do a whole-body skin exam, using a mirror to look at the skin on your back. Tell your health care provider about new moles, moles that have irregular borders, moles that are larger than a pencil eraser, or moles that have changed in shape or color.  Stay current with required vaccines (immunizations).  Influenza vaccine. All adults should be immunized every year.  Tetanus, diphtheria, and acellular pertussis (Td, Tdap) vaccine. An adult who has not previously received Tdap or who does not know his vaccine status should receive 1 dose of Tdap. This initial dose should be followed by tetanus and diphtheria toxoids (Td) booster doses every 10 years. Adults with an unknown or incomplete history of completing a 3-dose immunization series with Td-containing vaccines should begin or complete a primary immunization series including a Tdap dose. Adults should receive a Td booster every 10 years.  Varicella vaccine. An adult without evidence of immunity to varicella should receive 2 doses or a second dose if he has previously received 1 dose.  Human papillomavirus (HPV) vaccine. Males aged 3-21 years who have not received the vaccine previously should receive the 3-dose series. Males aged 22-26 years may be immunized. Immunization is recommended through the age of 54 years for any male who has  sex with males and did not get any or all doses earlier. Immunization is recommended for any person with an immunocompromised condition through the age of 81 years if he did not get any or all doses earlier. During the 3-dose series, the second dose should be obtained 4-8 weeks after the first dose. The third dose should be obtained 24 weeks after the first dose and 16 weeks after the second dose.  Zoster vaccine. One dose is recommended for adults aged 65 years or older unless certain conditions are present.  Measles, mumps, and rubella (MMR) vaccine. Adults born before [birth year] generally are considered immune to measles and  mumps. Adults born in [birth year] or later should have 1 or more doses of MMR vaccine unless there is a contraindication to the vaccine or there is laboratory evidence of immunity to each of the three diseases. A routine second dose of MMR vaccine should be obtained at least 28 days after the first dose for students attending postsecondary schools, health care workers, or international travelers. People who received inactivated measles vaccine or an unknown type of measles vaccine during 1963-1967 should receive 2 doses of MMR vaccine. People who received inactivated mumps vaccine or an unknown type of mumps vaccine before 1979 and are at high risk for mumps infection should consider immunization with 2 doses of MMR vaccine. Unvaccinated health care workers born before [birth year] who lack laboratory evidence of measles, mumps, or rubella immunity or laboratory confirmation of disease should consider measles and mumps immunization with 2 doses of MMR vaccine or rubella immunization with 1 dose of MMR vaccine.  Pneumococcal 13-valent conjugate (PCV13) vaccine. When indicated, a person who is uncertain of his immunization history and has no record of immunization should receive the PCV13 vaccine. An adult aged 50 years or older who has certain medical conditions and has not been previously immunized should  receive 1 dose of PCV13 vaccine. This PCV13 should be followed with a dose of pneumococcal polysaccharide (PPSV23) vaccine. The PPSV23 vaccine dose should be obtained at least 8 weeks after the dose of PCV13 vaccine. An adult aged 38 years or older who has certain medical conditions and previously received 1 or more doses of PPSV23 vaccine should receive 1 dose of PCV13. The PCV13 vaccine dose should be obtained 1 or more years after the last PPSV23 vaccine dose.  Pneumococcal polysaccharide (PPSV23) vaccine. When PCV13 is also indicated, PCV13 should be obtained first. All adults aged 56 years and older should be immunized. An adult younger than age 68 years who has certain medical conditions should be immunized. Any person who resides in a nursing home or long-term care facility should be immunized. An adult smoker should be immunized. People with an immunocompromised condition and certain other conditions should receive both PCV13 and PPSV23 vaccines. People with human immunodeficiency virus (HIV) infection should be immunized as soon as possible after diagnosis. Immunization during chemotherapy or radiation therapy should be avoided. Routine use of PPSV23 vaccine is not recommended for American Indians, [place] Natives, or people younger than 65 years unless there are medical conditions that require PPSV23 vaccine. When indicated, people who have unknown immunization and have no record of immunization should receive PPSV23 vaccine. One-time revaccination 5 years after the first dose of PPSV23 is recommended for people aged 19-64 years who have chronic kidney failure, nephrotic syndrome, asplenia, or immunocompromised conditions. People who received 1-2 doses of PPSV23 before age 51 years should receive another dose of PPSV23 vaccine at age 79 years or later if at least 5 years have passed since the previous dose. Doses of PPSV23 are not needed for people immunized with PPSV23 at or after age 75  years.  Meningococcal vaccine. Adults with asplenia or persistent complement component deficiencies should receive 2 doses of quadrivalent meningococcal conjugate (MenACWY-D) vaccine. The doses should be obtained at least 2 months apart. Microbiologists working with certain meningococcal bacteria, [employer] recruits, people at risk during an outbreak, and people who travel to or live in countries with a high rate of meningitis should be immunized. A first-year college student up through age 22 years who is living in a residence hall should  receive a dose if he did not receive a dose on or after his 16th birthday. Adults who have certain high-risk conditions should receive one or more doses of vaccine.  Hepatitis A vaccine. Adults who wish to be protected from this disease, have certain high-risk conditions, work with hepatitis A-infected animals, work in hepatitis A research labs, or travel to or work in countries with a high rate of hepatitis A should be immunized. Adults who were previously unvaccinated and who anticipate close contact with an international adoptee during the first 60 days after arrival in the [place] from a country with a high rate of hepatitis A should be immunized.  Hepatitis B vaccine. Adults should be immunized if they wish to be protected from this disease, have certain high-risk conditions, may be exposed to blood or other infectious body fluids, are household contacts or sex partners of hepatitis B positive people, are clients or workers in certain care facilities, or travel to or work in countries with a high rate of hepatitis B.  Haemophilus influenzae type b (Hib) vaccine. A previously unvaccinated person with asplenia or sickle cell disease or having a scheduled splenectomy should receive 1 dose of Hib vaccine. Regardless of previous immunization, a recipient of a hematopoietic stem cell transplant should receive a 3-dose series 6-12 months after his successful transplant.  Hib vaccine is not recommended for adults with HIV infection. Preventive Service / Frequency Ages 28 to 42  Blood pressure check.** / Every 1 to 2 years.  Lipid and cholesterol check.** / Every 5 years beginning at age 41.  Hepatitis C blood test.** / For any individual with known risks for hepatitis C.  Skin self-exam. / Monthly.  Influenza vaccine. / Every year.  Tetanus, diphtheria, and acellular pertussis (Tdap, Td) vaccine.** / Consult your health care provider. 1 dose of Td every 10 years.  Varicella vaccine.** / Consult your health care provider.  HPV vaccine. / 3 doses over 6 months, if 33 or younger.  Measles, mumps, rubella (MMR) vaccine.** / You need at least 1 dose of MMR if you were born in 1957 or later. You may also need a second dose.  Pneumococcal 13-valent conjugate (PCV13) vaccine.** / Consult your health care provider.  Pneumococcal polysaccharide (PPSV23) vaccine.** / 1 to 2 doses if you smoke cigarettes or if you have certain conditions.  Meningococcal vaccine.** / 1 dose if you are age 56 to 43 years and a Market researcher living in a residence hall, or have one of several medical conditions. You may also need additional booster doses.  Hepatitis A vaccine.** / Consult your health care provider.  Hepatitis B vaccine.** / Consult your health care provider.  Haemophilus influenzae type b (Hib) vaccine.** / Consult your health care provider. Ages 44 to 56  Blood pressure check.** / Every 1 to 2 years.  Lipid and cholesterol check.** / Every 5 years beginning at age 61.  Lung cancer screening. / Every year if you are aged 51-80 years and have a 30-pack-year history of smoking and currently smoke or have quit within the past 15 years. Yearly screening is stopped once you have quit smoking for at least 15 years or develop a health problem that would prevent you from having lung cancer treatment.  Fecal occult blood test (FOBT) of stool. / Every year  beginning at age 16 and continuing until age 71. You may not have to do this test if you get a colonoscopy every 10 years.  Flexible sigmoidoscopy**  or colonoscopy.** / Every 5 years for a flexible sigmoidoscopy or every 10 years for a colonoscopy beginning at age 40 and continuing until age 57.  Hepatitis C blood test.** / For all people born from [birth year] through 1966 and any individual with known risks for hepatitis C.  Skin self-exam. / Monthly.  Influenza vaccine. / Every year.  Tetanus, diphtheria, and acellular pertussis (Tdap/Td) vaccine.** / Consult your health care provider. 1 dose of Td every 10 years.  Varicella vaccine.** / Consult your health care provider.  Zoster vaccine.** / 1 dose for adults aged 22 years or older.  Measles, mumps, rubella (MMR) vaccine.** / You need at least 1 dose of MMR if you were born in 1958 or later. You may also need a second dose.  Pneumococcal 13-valent conjugate (PCV13) vaccine.** / Consult your health care provider.  Pneumococcal polysaccharide (PPSV23) vaccine.** / 1 to 2 doses if you smoke cigarettes or if you have certain conditions.  Meningococcal vaccine.** / Consult your health care provider.  Hepatitis A vaccine.** / Consult your health care provider.  Hepatitis B vaccine.** / Consult your health care provider.  Haemophilus influenzae type b (Hib) vaccine.** / Consult your health care provider. Ages 18 and over  Blood pressure check.** / Every 1 to 2 years.  Lipid and cholesterol check.**/ Every 5 years beginning at age 61.  Lung cancer screening. / Every year if you are aged 56-80 years and have a 30-pack-year history of smoking and currently smoke or have quit within the past 15 years. Yearly screening is stopped once you have quit smoking for at least 15 years or develop a health problem that would prevent you from having lung cancer treatment.  Fecal occult blood test (FOBT) of stool. / Every year beginning at age 72 and  continuing until age 73. You may not have to do this test if you get a colonoscopy every 10 years.  Flexible sigmoidoscopy** or colonoscopy.** / Every 5 years for a flexible sigmoidoscopy or every 10 years for a colonoscopy beginning at age 49 and continuing until age 72.  Hepatitis C blood test.** / For all people born from [birth year] through 1966 and any individual with known risks for hepatitis C.  Abdominal aortic aneurysm (AAA) screening.** / A one-time screening for ages 72 to 24 years who are current or former smokers.  Skin self-exam. / Monthly.  Influenza vaccine. / Every year.  Tetanus, diphtheria, and acellular pertussis (Tdap/Td) vaccine.** / 1 dose of Td every 10 years.  Varicella vaccine.** / Consult your health care provider.  Zoster vaccine.** / 1 dose for adults aged 50 years or older.  Pneumococcal 13-valent conjugate (PCV13) vaccine.** / Consult your health care provider.  Pneumococcal polysaccharide (PPSV23) vaccine.** / 1 dose for all adults aged 45 years and older.  Meningococcal vaccine.** / Consult your health care provider.  Hepatitis A vaccine.** / Consult your health care provider.  Hepatitis B vaccine.** / Consult your health care provider.  Haemophilus influenzae type b (Hib) vaccine.** / Consult your health care provider. **Family history and personal history of risk and conditions may change your health care provider's recommendations. Document Released: 07/20/2001 Document Revised: 05/29/2013 Document Reviewed: 10/19/2010 [employer] Patient Information 2015 [place], [place]. This information is not intended to replace advice given to you by your health care provider. Make sure you discuss any questions you have with your health care provider.

## 2015-02-24 NOTE — Assessment & Plan Note (Signed)
Tolerating statin, encouraged heart healthy diet, avoid trans fats, minimize simple carbs and saturated fats. Increase exercise as tolerated 

## 2015-02-24 NOTE — Assessment & Plan Note (Signed)
B/l great toenails try soaking in vinegar and hot water and apply Vick's Vapor Rub

## 2015-02-24 NOTE — Progress Notes (Signed)
Pre visit review using our clinic review tool, if applicable. No additional management support is needed unless otherwise documented below in the visit note. 

## 2015-02-24 NOTE — Assessment & Plan Note (Signed)
Persistent for several months, patient has been using neosporin which helps some but appears fungal on exam today. Started on Diflucan weekly x 4 weeks and then refer to urology for ongoing evaluation

## 2015-02-24 NOTE — Progress Notes (Signed)
Subjective:   Harry Brown is a 74 y.o. male who presents for Medicare Annual/Subsequent preventive examination.he is generally doing well. He has been noting some discharge from ears and itching at night but no pain or hearing changes. No congestion. No other concerns. Has been trying to stay active and maintain a heart healthy diet. Denies CP/palp/SOB/HA/congestion/fevers/GI or GU c/o. Taking meds as prescribed Review of Systems:  Review of Systems  Constitutional: Negative for fever, chills and malaise/fatigue.  HENT: Positive for ear discharge. Negative for congestion and hearing loss.   Eyes: Negative for discharge.  Respiratory: Negative for cough, sputum production and shortness of breath.   Cardiovascular: Negative for chest pain, palpitations and leg swelling.  Gastrointestinal: Negative for heartburn, nausea, vomiting, abdominal pain, diarrhea, constipation and blood in stool.  Genitourinary: Negative for dysuria, urgency, frequency and hematuria.  Musculoskeletal: Negative for myalgias, back pain and falls.  Skin: Negative for rash.  Neurological: Negative for dizziness, sensory change, loss of consciousness, weakness and headaches.  Endo/Heme/Allergies: Negative for environmental allergies. Does not bruise/bleed easily.  Psychiatric/Behavioral: Negative for depression and suicidal ideas. The patient is not nervous/anxious and does not have insomnia.           Objective:    Vitals: BP 120/80 mmHg  Pulse 72  Temp(Src) 98.6 F (37 C) (Oral)  Ht 6' (1.829 m)  Wt 227 lb (102.967 kg)  BMI 30.78 kg/m2  SpO2 98% Physical Exam  Constitutional: He is oriented to person, place, and time and well-developed, well-nourished, and in no distress. No distress.  HENT:  Head: Normocephalic and atraumatic.  Eyes: Conjunctivae are normal.  Neck: Neck supple. No thyromegaly present.  Cardiovascular: Normal rate and regular rhythm.   Murmur heard. Pulmonary/Chest: Effort normal  and breath sounds normal. No respiratory distress.  Abdominal: He exhibits no distension and no mass. There is no tenderness.  Musculoskeletal: He exhibits no edema.  Neurological: He is alert and oriented to person, place, and time.  Skin: Skin is warm.  Psychiatric: Memory, affect and judgment normal.   Tobacco History  Smoking status  . Former Smoker -- 1.50 packs/day for 50 years  . Types: Cigarettes  Smokeless tobacco  . Never Used    Comment: 05/14/2013 "quit smoking in ~ 2004; still Uses Comit lozenges"     Counseling given: Yes   Past Medical History  Diagnosis Date  . Diverticulitis   . CAD (coronary artery disease)   . Hypothyroidism   . ED (erectile dysfunction)   . Other and unspecified hyperlipidemia   . Neck pain on left side 11/22/2012  . Urinary urgency 11/22/2012  . Dizziness   . Hypertension   . Myocardial infarction 11/05/1985  . History of sleep apnea     had surgery to correct  . Type II diabetes mellitus     fasting 100-120  . Peripheral vascular disease   . Cellulitis 05/14/2013    RLE  . Postoperative anemia due to acute blood loss 09/24/2013  . Anemia 09/24/2013  . Medicare annual wellness visit, subsequent 04/13/2013  . Glaucoma 08/18/2014   Past Surgical History  Procedure Laterality Date  . Coronary artery bypass graft  2001    "CABG X3" (05/14/2013)  . Palate / uvula biopsy / excision  2003    Removed due to sleep apnea  . Anterior cervical decomp/discectomy fusion  ~ 2000  . Abdominal aortagram  Oct. 9, 2014    Dr. Bridgett Larsson  . Tonsillectomy    .  Dental surgery      "multiple teeth removed; trmimed top of mouth so dentures would fit" (05/14/2013)  . Cataract extraction Right   . Femoral-popliteal bypass graft Right 04/25/2013    Procedure: RIGHT FEMORAL TO ABOVE KNEE POPLITEAL BYPASS GRAFT WITH RIGHT GREATER SAPHENOUS VEIN HARVEST; ULTRASOUND GUIDED;  Surgeon: Conrad [place], MD;  Location: [facility] OR;  Service: Vascular;  Laterality: Right;  . Gum  surgery  [phone]    "had bone scraped; had periodontal disease" (05/14/2013)  . Abdominal aortagram N/A 03/15/2013    Procedure: ABDOMINAL Maxcine Ham;  Surgeon: Conrad [place], MD;  Location: [facility] CATH LAB;  Service: Cardiovascular;  Laterality: N/A;  . Eye surgery      cataracts   Family History  Problem Relation Age of Onset  . Hyperlipidemia Mother   . Heart disease Mother   . Diabetes Mother   . Hypertension Mother   . Heart disease Father   . Asthma Father   . Arthritis Father   . Cancer Neg Hx   . Heart disease Sister   . Heart disease Sister     s/p bypass x 2  . Lupus Sister   . Heart disease Brother     MI   History  Sexual Activity  . Sexual Activity: Not Currently    Comment: lives with wife, retiring end of next week, no dietary restrictions    Outpatient Encounter Prescriptions as of 02/24/2015  Medication Sig  . aspirin EC 81 MG tablet Take 81 mg by mouth at bedtime.  Marland Kitchen atenolol (TENORMIN) 25 MG tablet TAKE ONE (1) TABLET EACH DAY  . atorvastatin (LIPITOR) 40 MG tablet TAKE ONE (1) TABLET EACH DAY  . finasteride (PROSCAR) 5 MG tablet TAKE ONE (1) TABLET EACH DAY  . Lancets (FREESTYLE) lancets Use to check blood sugar once a day.  Dx. 250.00.  Marland Kitchen latanoprost (XALATAN) 0.005 % ophthalmic solution Place into both eyes at bedtime.  Marland Kitchen levothyroxine (SYNTHROID, LEVOTHROID) 50 MCG tablet TAKE ONE (1) TABLET EACH DAY BEFORE BREAKFAST  . loratadine (ALAVERT) 10 MG tablet Take 10 mg by mouth daily as needed for allergies.  . metFORMIN (GLUCOPHAGE-XR) 500 MG 24 hr tablet TAKE TWO TABLETS BY MOUTH DAILY WITH BREAKFAST  . Multiple Vitamin (MULTIVITAMIN WITH MINERALS) TABS tablet Take 1 tablet by mouth daily.  . ONE TOUCH ULTRA TEST test strip USE TO CHECK BLOOD SUGAR ONCE DAILY  . ONETOUCH DELICA LANCETS 52W MISC   . tamsulosin (FLOMAX) 0.4 MG CAPS capsule TAKE ONE (1) CAPSULE EACH DAY  . zoster vaccine live, PF, (ZOSTAVAX) 41324 UNT/0.65ML injection Inject 19,400 Units into the  skin once.   No facility-administered encounter medications on file as of 02/24/2015.    Activities of Daily Living In your present state of health, do you have any difficulty performing the following activities: 08/12/2014  Hearing? Y  Vision? N  Difficulty concentrating or making decisions? N  Walking or climbing stairs? N  Dressing or bathing? N  Doing errands, shopping? N    Patient Care Team: [name], MD as PCP - General (Family Medicine) Lelon Perla, MD as Consulting Physician (Cardiology) Conrad [place], MD as Consulting Physician (Vascular Surgery)   Assessment:   Exercise Activities and Dietary recommendations Increase activity as tolerated and consider DASH diet.     Goals    None     Fall Risk Fall Risk  01/29/2014  Falls in the past year? No   Depression Screen PHQ 2/9 Scores  01/29/2014  PHQ - 2 Score 0    Cognitive Testing No flowsheet data found.  Immunization History  Administered Date(s) Administered  . Influenza Split 03/31/2012  . Influenza, High Dose Seasonal PF 03/20/2013, 02/24/2015  . Influenza,inj,Quad PF,36+ Mos 01/29/2014  . Pneumococcal Conjugate-13 03/20/2013  . Td 06/07/2009   Screening Tests Health Maintenance  Topic Date Due  . FOOT EXAM  11/[birth year]/1949  . COLONOSCOPY  11/23/1989  . ZOSTAVAX  11/24/1999  . PNA vac Low Risk Adult (2 of 2 - PPSV23) 03/20/2014  . URINE MICROALBUMIN  07/30/2014  . OPHTHALMOLOGY EXAM  03/27/2015  . HEMOGLOBIN A1C  08/14/2015  . INFLUENZA VACCINE  01/06/2016  . TETANUS/TDAP  06/08/2019      Plan:    During the course of the visit the patient was educated and counseled about the following appropriate screening and preventive services:   Vaccines to include Pneumoccal, Influenza, Hepatitis B, Td, Zostavax, HCV  Electrocardiogram  Cardiovascular Disease  Colorectal cancer screening  Diabetes screening  Prostate Cancer Screening  Glaucoma screening  Nutrition counseling   Smoking  cessation counseling  Patient Instructions (the written plan) was given to the patient.   Hypothyroidism On Levothyroxine, continue to monitor. TSH mildly elevated, asymptomatic will recheck in 3 months  Onychomycosis B/l great toenails try soaking in vinegar and hot water and apply Vick's Vapor Rub  DM (diabetes mellitus) hgba1c acceptable, minimize simple carbs. Increase exercise as tolerated. Continue current meds  Hyperlipidemia Tolerating statin, encouraged heart healthy diet, avoid trans fats, minimize simple carbs and saturated fats. Increase exercise as tolerated  Penile lesion Persistent for several months, patient has been using neosporin which helps some but appears fungal on exam today. Started on Diflucan weekly x 4 weeks and then refer to urology for ongoing evaluation  Medicare annual wellness visit, subsequent Patient denies any difficulties at home. No trouble with ADLs, depression or falls. No recent changes to vision or hearing. Is UTD with immunizations. Is UTD with screening. Discussed Advanced Directives, patient agrees to bring [place] copies of documents if can. Encouraged heart healthy diet, exercise as tolerated and adequate sleep  Allergies verified: UTD   Immunization Status: Flu vaccine-- would like at appt  Tdap-- 2011  PNA-- 06/20/12 (13)  Shingles-- would like, states his insurance will cover (was given Rx previously, but did not get)   A/P:  Changes to [place], [place] or Personal Hx: UTD CCS: has had, unknown date, states polyps removed but was not told to f/u  PSA: 11/20/12- 0.58 Bone Density: none found   Care Teams Updated: Kirk Ruths, MD- Cardiology, Adele Barthel, MD- Vascular Surgery  ED/Hospital/Urgent Care Visits: None recent   Elizabeth Sauer, LPN  06/17/5518

## 2015-02-24 NOTE — Assessment & Plan Note (Signed)
hgba1c acceptable, minimize simple carbs. Increase exercise as tolerated. Continue current meds 

## 2015-02-26 ENCOUNTER — Encounter: Payer: Self-pay | Admitting: Family Medicine

## 2015-02-26 NOTE — Assessment & Plan Note (Signed)
Patient denies any difficulties at home. No trouble with ADLs, depression or falls. No recent changes to vision or hearing. Is UTD with immunizations. Is UTD with screening. Discussed Advanced Directives, patient agrees to bring [place] copies of documents if can. Encouraged heart healthy diet, exercise as tolerated and adequate sleep  Allergies verified: UTD   Immunization Status: Flu vaccine-- would like at appt  Tdap-- 2011  PNA-- 06/20/12 (13)  Shingles-- would like, states his insurance will cover (was given Rx previously, but did not get)   A/P:  Changes to [place], PSH or Personal Hx: UTD CCS: has had, unknown date, states polyps removed but was not told to f/u  PSA: 11/20/12- 0.58 Bone Density: none found   Care Teams Updated: Kirk Ruths, MD- Cardiology, Adele Barthel, MD- Vascular Surgery  ED/Hospital/Urgent Care Visits: None recent

## 2015-03-03 ENCOUNTER — Ambulatory Visit (HOSPITAL_COMMUNITY)
Admission: RE | Admit: 2015-03-03 | Discharge: 2015-03-03 | Disposition: A | Discharge status: Home / Self Care | Payer: Medicare Other | Source: Ambulatory Visit | Attending: Cardiovascular Disease | Admitting: Cardiovascular Disease

## 2015-03-03 DIAGNOSIS — E119 Type 2 diabetes mellitus without complications: Secondary | ICD-10-CM | POA: Insufficient documentation

## 2015-03-03 DIAGNOSIS — I714 Abdominal aortic aneurysm, without rupture, unspecified: Secondary | ICD-10-CM

## 2015-03-03 DIAGNOSIS — I10 Essential (primary) hypertension: Secondary | ICD-10-CM | POA: Diagnosis not present

## 2015-03-03 DIAGNOSIS — I708 Atherosclerosis of other arteries: Secondary | ICD-10-CM | POA: Insufficient documentation

## 2015-03-19 DIAGNOSIS — N471 Phimosis: Secondary | ICD-10-CM | POA: Diagnosis not present

## 2015-03-19 DIAGNOSIS — N481 Balanitis: Secondary | ICD-10-CM | POA: Diagnosis not present

## 2015-03-20 ENCOUNTER — Other Ambulatory Visit: Payer: Self-pay | Admitting: Family Medicine

## 2015-04-08 ENCOUNTER — Encounter: Payer: Self-pay | Admitting: Family

## 2015-04-11 ENCOUNTER — Ambulatory Visit (HOSPITAL_COMMUNITY)
Admission: RE | Admit: 2015-04-11 | Discharge: 2015-04-11 | Disposition: A | Discharge status: Home / Self Care | Payer: Medicare Other | Source: Ambulatory Visit | Attending: Family | Admitting: Family

## 2015-04-11 ENCOUNTER — Ambulatory Visit (INDEPENDENT_AMBULATORY_CARE_PROVIDER_SITE_OTHER)
Admission: RE | Admit: 2015-04-11 | Discharge: 2015-04-11 | Disposition: A | Discharge status: Home / Self Care | Payer: Medicare Other | Source: Ambulatory Visit | Attending: Family | Admitting: Family

## 2015-04-11 ENCOUNTER — Encounter: Payer: Self-pay | Admitting: Family

## 2015-04-11 ENCOUNTER — Other Ambulatory Visit: Payer: Self-pay | Admitting: Family

## 2015-04-11 ENCOUNTER — Ambulatory Visit (INDEPENDENT_AMBULATORY_CARE_PROVIDER_SITE_OTHER): Payer: Medicare Other | Admitting: Family

## 2015-04-11 VITALS — BP 118/78 | HR 76 | Temp 97.4°F | Resp 18 | Ht 72.0 in | Wt 225.0 lb

## 2015-04-11 DIAGNOSIS — Z95828 Presence of other vascular implants and grafts: Secondary | ICD-10-CM

## 2015-04-11 DIAGNOSIS — E1151 Type 2 diabetes mellitus with diabetic peripheral angiopathy without gangrene: Secondary | ICD-10-CM | POA: Diagnosis not present

## 2015-04-11 DIAGNOSIS — I739 Peripheral vascular disease, unspecified: Secondary | ICD-10-CM

## 2015-04-11 DIAGNOSIS — Z48812 Encounter for surgical aftercare following surgery on the circulatory system: Secondary | ICD-10-CM

## 2015-04-11 DIAGNOSIS — Z87891 Personal history of nicotine dependence: Secondary | ICD-10-CM

## 2015-04-11 NOTE — Progress Notes (Signed)
[facility] [place] HISTORY AND PHYSICAL -PAD  History of Present Illness Harry Brown is a 74 y.o. male patient of Dr. Bridgett Larsson who is s/p right femoropopliteal arterial bypass graft w/ NR ips GSV on 45/85/92; this was complicated with wound issues. He denies any claudication with walking, denies non healing wounds since his surgical incision healed. He denies any history of stroke or TIA.  The patient reports New Medical or Surgical History: none He reports beginning stages of glaucoma; is newly retired. He admits to walking less in the last several months.   Dr. Stanford Breed is monitoring pt's small AAA, normal caliber external and common iliac arteries; last result on file is 03/03/15.  Pt Diabetic: Yes, 6.9 A1C September 2016 (review of records) Pt smoker: former smoker, quit in 2006  Pt meds include: Statin :Yes Betablocker: Yes ASA: Yes Other anticoagulants/antiplatelets: no    Past Medical History  Diagnosis Date  . Diverticulitis   . CAD (coronary artery disease)   . Hypothyroidism   . ED (erectile dysfunction)   . Other and unspecified hyperlipidemia   . Neck pain on left side 11/22/2012  . Urinary urgency 11/22/2012  . Dizziness   . Hypertension   . Myocardial infarction ([place]) 11/05/1985  . History of sleep apnea     had surgery to correct  . Type II diabetes mellitus (H[facility])     fasting 100-120  . Peripheral vascular disease ([place])   . Cellulitis 05/14/2013    RLE  . Postoperative anemia due to acute blood loss 09/24/2013  . Anemia 09/24/2013  . Medicare annual wellness visit, subsequent 04/13/2013  . Glaucoma 08/18/2014  . Penile lesion 02/24/2015    Social History Social History  Substance Use Topics  . Smoking status: Former Smoker -- 1.50 packs/day for 50 years    Types: Cigarettes  . Smokeless tobacco: Never Used     Comment: 05/14/2013 "quit smoking in ~ 2004; still Uses Comit lozenges"  . Alcohol Use: No    Family History Family  History  Problem Relation Age of Onset  . Hyperlipidemia Mother   . Heart disease Mother   . Diabetes Mother   . Hypertension Mother   . Heart disease Father   . Asthma Father   . Arthritis Father   . Cancer Neg Hx   . Heart disease Sister   . Heart disease Sister     s/p bypass x 2  . Lupus Sister   . Heart disease Brother     MI    Past Surgical History  Procedure Laterality Date  . Coronary artery bypass graft  2001    "CABG X3" (05/14/2013)  . Palate / uvula biopsy / excision  2003    Removed due to sleep apnea  . Anterior cervical decomp/discectomy fusion  ~ 2000  . Abdominal aortagram  Oct. 9, 2014    Dr. Bridgett Larsson  . Tonsillectomy    . Dental surgery      "multiple teeth removed; trmimed top of mouth so dentures would fit" (05/14/2013)  . Cataract extraction Right   . Femoral-popliteal bypass graft Right 04/25/2013    Procedure: RIGHT FEMORAL TO ABOVE KNEE POPLITEAL BYPASS GRAFT WITH RIGHT GREATER SAPHENOUS VEIN HARVEST; ULTRASOUND GUIDED;  Surgeon: Conrad , MD;  Location: [facility] OR;  Service: Vascular;  Laterality: Right;  . Gum surgery  [phone]    "had bone scraped; had periodontal disease" (05/14/2013)  . Abdominal aortagram N/A 03/15/2013    Procedure: ABDOMINAL  Maxcine Ham;  Surgeon: Conrad [place], MD;  Location: [facility] CATH LAB;  Service: Cardiovascular;  Laterality: N/A;  . Eye surgery      cataracts    Allergies  Allergen Reactions  . Adhesive [Tape]   . Latex     rash  . Other Hives    Plastic shopping bags Metal staples: rash, swelling    Current Outpatient Prescriptions  Medication Sig Dispense Refill  . aspirin EC 81 MG tablet Take 81 mg by mouth at bedtime.    Marland Kitchen atenolol (TENORMIN) 25 MG tablet TAKE ONE (1) TABLET EACH DAY 30 tablet 5  . atorvastatin (LIPITOR) 40 MG tablet TAKE ONE (1) TABLET EACH DAY 30 tablet 6  . finasteride (PROSCAR) 5 MG tablet TAKE ONE (1) TABLET EACH DAY 30 tablet 3  . Lancets (FREESTYLE) lancets Use to check blood sugar once a day.   Dx. 250.00. 100 each 1  . latanoprost (XALATAN) 0.005 % ophthalmic solution Place into both eyes at bedtime.    Marland Kitchen levothyroxine (SYNTHROID, LEVOTHROID) 50 MCG tablet TAKE ONE (1) TABLET EACH DAY BEFORE BREAKFAST 30 tablet 6  . loratadine (ALAVERT) 10 MG tablet Take 10 mg by mouth daily as needed for allergies.    . metFORMIN (GLUCOPHAGE-XR) 500 MG 24 hr tablet TAKE TWO TABLETS BY MOUTH DAILY WITH BREAKFAST 60 tablet 3  . Multiple Vitamin (MULTIVITAMIN WITH MINERALS) TABS tablet Take 1 tablet by mouth daily.    . ONE TOUCH ULTRA TEST test strip USE TO CHECK BLOOD SUGAR ONCE DAILY 100 each 6  . ONETOUCH DELICA LANCETS 84X MISC     . tamsulosin (FLOMAX) 0.4 MG CAPS capsule TAKE ONE CAPSULE BY MOUTH DAILY 30 capsule 6  . zoster vaccine live, PF, (ZOSTAVAX) 32440 UNT/0.65ML injection Inject 19,400 Units into the skin once. 1 each 0  . fluconazole (DIFLUCAN) 150 MG tablet Take 1 tablet (150 mg total) by mouth daily. (Patient not taking: Reported on 04/11/2015) 4 tablet 0   No current facility-administered medications for this visit.    ROS: See HPI for pertinent positives and negatives.   Physical Examination  Filed Vitals:   04/11/15 1421  BP: 118/78  Pulse: 76  Temp: 97.4 F (36.3 C)  Resp: 18  Height: 6' (1.829 m)  Weight: 225 lb (102.059 kg)  SpO2: 97%   Body mass index is 30.51 kg/(m^2).  General: A&O x 3, WDWN, obese male.   Pulmonary: Sym exp, good air movt, CTAB, no rales, rhonchi, or wheezing  Cardiac: RRR, Nl S1, S2, no detected murmurs  Vascular: Vessel Right Left  Radial 1+Palpable 2+Palpable  Carotid Palpable, without bruit Palpable, without bruit  Aorta Not palpable N/A  Femoral 2+Palpable Not Palpable  Popliteal Not palpable Not palpable  PT 2+Palpable Not Palpable  DP faintly Palpable not palpable   Gastrointestinal: soft, NTND, -G/R, - HSM, - palpable masses, - CVAT B  Musculoskeletal: M/S 5/5 throughout,  extremities without ischemic changes, R vein harvest incision healed  Neurologic: Pain and light touch intact in extremities , Motor exam as listed above               Non-Invasive Vascular Imaging: DATE: 04/11/2015 LOWER EXTREMITY ARTERIAL DUPLEX EVALUATION    INDICATION: Peripheral Vascular Disease    PREVIOUS INTERVENTION(S): Right femoral-popliteal bypass graft 04/25/2013    DUPLEX EXAM:     RIGHT  LEFT   Peak Systolic Velocity (cm/s) Ratio (if abnormal) Waveform  Peak Systolic Velocity (cm/s) Ratio (if abnormal) Waveform  107/113  T/T Inflow Artery     119  B Proximal Anastomosis     80  T Proximal Graft     90  T Mid Graft     106  T  Distal Graft     81  T Distal Anastomosis     66/158 2.4 Hyper-emic/T Outflow Artery     0.90/0.56 Today's ABI / TBI 0.79/0.44  1.10/0.70 Previous ABI / TBI (10/04/2014 ) 0.99/0.46    Waveform:    M - Monophasic       B - Biphasic       T - Triphasic  If Ankle Brachial Index (ABI) or Toe Brachial Index (TBI) performed, please see complete report  ADDITIONAL FINDINGS:     IMPRESSION: Patent right femoral to popliteal artery bypass graft. Abnormal velocity change present at the tibio-peroneal trunk arterial outflow suggestive of 50%-74% stenosis. Abnormal monophasic waveform present involving the right anterior tibial artery suggestive of disease.    Compared to the previous exam:  Decreased ankle brachial indices since study on 10/04/2014.    ASSESSMENT: Harry Brown is a 74 y.o. male who is s/p right femoropopliteal arterial bypass graft w/ NR ips GSV on 04/25/13. He no longer has claudication symptoms, no tissue loss in his lower extremities. Today's right LE arterial Duplex suggests a patent right femoral to popliteal artery bypass graft. Abnormal velocity change present at the tibio-peroneal trunk arterial outflow suggestive of 50%-74% stenosis. Abnormal monophasic waveform present involving the right anterior tibial artery  suggestive of disease. Decreased ankle brachial indices since study on 10/04/2014.   He admits to walking less, denies any barriers to walking. His atherosclerotic risk factors include currently controlled DM and former smoker.   PLAN:  Graduated walking program discussed and how to execute same.  Based on the patient's vascular studies and examination, pt will return to clinic in 1 year with ABI's and right LE arterial duplex. Pt knows to notify us if he has concerns about the  Circulation in his legs. I discussed in depth with the patient the nature of atherosclerosis, and emphasized the importance of maximal medical management including strict control of blood pressure, blood glucose, and lipid levels, obtaining regular exercise, and continued cessation of smoking.  The patient is aware that without maximal medical management the underlying atherosclerotic disease process will progress, limiting the benefit of any interventions.  The patient was given information about PAD including signs, symptoms, treatment, what symptoms should prompt the patient to seek immediate medical care, and risk reduction measures to take.  Clemon Chambers, RN, MSN, FNP-C [facility] Phone: [phone]  Clinic MD: Early on call  04/11/2015 2:45 PM

## 2015-04-11 NOTE — Patient Instructions (Signed)
Peripheral Vascular Disease Peripheral vascular disease (PVD) is a disease of the blood vessels that are not part of your heart and brain. A simple term for PVD is poor circulation. In most cases, PVD narrows the blood vessels that carry blood from your heart to the rest of your body. This can result in a decreased supply of blood to your arms, legs, and internal organs, like your stomach or kidneys. However, it most often affects a person's lower legs and feet. There are two types of PVD.  Organic PVD. This is the more common type. It is caused by damage to the structure of blood vessels.  Functional PVD. This is caused by conditions that make blood vessels contract and tighten (spasm). Without treatment, PVD tends to get worse over time. PVD can also lead to acute ischemic limb. This is when an arm or limb suddenly has trouble getting enough blood. This is a medical emergency. CAUSES Each type of PVD has many different causes. The most common cause of PVD is buildup of a fatty material (plaque) inside of your arteries (atherosclerosis). Small amounts of plaque can break off from the walls of the blood vessels and become lodged in a smaller artery. This blocks blood flow and can cause acute ischemic limb. Other common causes of PVD include:  Blood clots that form inside of blood vessels.  Injuries to blood vessels.  Diseases that cause inflammation of blood vessels or cause blood vessel spasms.  Health behaviors and health history that increase your risk of developing PVD. RISK FACTORS  You may have a greater risk of PVD if you:  Have a family history of PVD.  Have certain medical conditions, including:  High cholesterol.  Diabetes.  High blood pressure (hypertension).  Coronary heart disease.  Past problems with blood clots.  Past injury, such as burns or a broken bone. These may have damaged blood vessels in your limbs.  Buerger disease. This is caused by inflamed blood  vessels in your hands and feet.  Some forms of arthritis.  Rare birth defects that affect the arteries in your legs.  Use tobacco.  Do not get enough exercise.  Are obese.  Are age 50 or older. SIGNS AND SYMPTOMS  PVD may cause many different symptoms. Your symptoms depend on what part of your body is not getting enough blood. Some common signs and symptoms include:  Cramps in your lower legs. This may be a symptom of poor leg circulation (claudication).  Pain and weakness in your legs while you are physically active that goes away when you rest (intermittent claudication).  Leg pain when at rest.  Leg numbness, tingling, or weakness.  Coldness in a leg or foot, especially when compared with the other leg.  Skin or hair changes. These can include:  Hair loss.  Shiny skin.  Pale or bluish skin.  Thick toenails.  Inability to get or maintain an erection (erectile dysfunction). People with PVD are more prone to developing ulcers and sores on their toes, feet, or legs. These may take longer than normal to heal. DIAGNOSIS Your health care provider may diagnose PVD from your signs and symptoms. The health care provider will also do a physical exam. You may have tests to find out what is causing your PVD and determine its severity. Tests may include:  Blood pressure recordings from your arms and legs and measurements of the strength of your pulses (pulse volume recordings).  Imaging studies using sound waves to take pictures of   the blood flow through your blood vessels (Doppler ultrasound).  Injecting a dye into your blood vessels before having imaging studies using:  X-rays (angiogram or arteriogram).  Computer-generated X-rays (CT angiogram).  A powerful electromagnetic field and a computer (magnetic resonance angiogram or MRA). TREATMENT Treatment for PVD depends on the cause of your condition and the severity of your symptoms. It also depends on your age. Underlying  causes need to be treated and controlled. These include long-lasting (chronic) conditions, such as diabetes, high cholesterol, and high blood pressure. You may need to first try making lifestyle changes and taking medicines. Surgery may be needed if these do not work. Lifestyle changes may include:  Quitting smoking.  Exercising regularly.  Following a low-fat, low-cholesterol diet. Medicines may include:  Blood thinners to prevent blood clots.  Medicines to improve blood flow.  Medicines to improve your blood cholesterol levels. Surgical procedures may include:  A procedure that uses an inflated balloon to open a blocked artery and improve blood flow (angioplasty).  A procedure to put in a tube (stent) to keep a blocked artery open (stent implant).  Surgery to reroute blood flow around a blocked artery (peripheral bypass surgery).  Surgery to remove dead tissue from an infected wound on the affected limb.  Amputation. This is surgical removal of the affected limb. This may be necessary in cases of acute ischemic limb that are not improved through medical or surgical treatments. HOME CARE INSTRUCTIONS  Take medicines only as directed by your health care provider.  Do not use any tobacco products, including cigarettes, chewing tobacco, or electronic cigarettes. If you need help quitting, ask your health care provider.  Lose weight if you are overweight, and maintain a healthy weight as directed by your health care provider.  Eat a diet that is low in fat and cholesterol. If you need help, ask your health care provider.  Exercise regularly. Ask your health care provider to suggest some good activities for you.  Use compression stockings or other mechanical devices as directed by your health care provider.  Take good care of your feet.  Wear comfortable shoes that fit well.  Check your feet often for any cuts or sores. SEEK MEDICAL CARE IF:  You have cramps in your legs  while walking.  You have leg pain when you are at rest.  You have coldness in a leg or foot.  Your skin changes.  You have erectile dysfunction.  You have cuts or sores on your feet that are not healing. SEEK IMMEDIATE MEDICAL CARE IF:  Your arm or leg turns cold and blue.  Your arms or legs become red, warm, swollen, painful, or numb.  You have chest pain or trouble breathing.  You suddenly have weakness in your face, arm, or leg.  You become very confused or lose the ability to speak.  You suddenly have a very bad headache or lose your vision.   This information is not intended to replace advice given to you by your health care provider. Make sure you discuss any questions you have with your health care provider.   Document Released: 07/01/2004 Document Revised: 06/14/2014 Document Reviewed: 11/01/2013 Elsevier Interactive Patient Education 2016 [employer]  

## 2015-04-14 NOTE — Addendum Note (Signed)
Addended by: Dorthula Rue L on: 04/14/2015 09:08 AM   Modules accepted: Orders

## 2015-05-20 DIAGNOSIS — H401221 Low-tension glaucoma, left eye, mild stage: Secondary | ICD-10-CM | POA: Diagnosis not present

## 2015-05-20 DIAGNOSIS — E119 Type 2 diabetes mellitus without complications: Secondary | ICD-10-CM | POA: Diagnosis not present

## 2015-05-20 DIAGNOSIS — H2512 Age-related nuclear cataract, left eye: Secondary | ICD-10-CM | POA: Diagnosis not present

## 2015-05-30 ENCOUNTER — Other Ambulatory Visit: Payer: Self-pay | Admitting: Family Medicine

## 2015-06-03 ENCOUNTER — Other Ambulatory Visit: Payer: Self-pay | Admitting: Family Medicine

## 2015-06-12 ENCOUNTER — Other Ambulatory Visit: Payer: Self-pay | Admitting: Family Medicine

## 2015-08-11 ENCOUNTER — Other Ambulatory Visit: Payer: Self-pay | Admitting: Family Medicine

## 2015-09-05 ENCOUNTER — Other Ambulatory Visit: Payer: Self-pay | Admitting: Family

## 2015-09-05 NOTE — Telephone Encounter (Signed)
Levothyroxine refill sent to pharmacy. Pt last seen by PCP 02/2015 and has no future appts scheduled.  When should pt follow up in the office?

## 2015-09-05 NOTE — Telephone Encounter (Signed)
He was supposed to return in December so he should return soon

## 2015-09-08 NOTE — Telephone Encounter (Signed)
Tiffany-- pt is past due for follow up with Dr Charlett Blake.  Please call him to schedule appt within 30 days. Thanks!

## 2015-09-10 NOTE — Telephone Encounter (Signed)
Patient scheduled 10/16/15.

## 2015-09-24 DIAGNOSIS — H401221 Low-tension glaucoma, left eye, mild stage: Secondary | ICD-10-CM | POA: Diagnosis not present

## 2015-10-01 ENCOUNTER — Other Ambulatory Visit: Payer: Self-pay | Admitting: Family Medicine

## 2015-10-13 ENCOUNTER — Other Ambulatory Visit: Payer: Self-pay | Admitting: Family Medicine

## 2015-10-16 ENCOUNTER — Encounter: Payer: Self-pay | Admitting: Family Medicine

## 2015-10-16 ENCOUNTER — Ambulatory Visit (INDEPENDENT_AMBULATORY_CARE_PROVIDER_SITE_OTHER): Payer: Medicare Other | Admitting: Family Medicine

## 2015-10-16 VITALS — BP 120/78 | HR 73 | Temp 98.3°F | Ht 72.0 in | Wt 228.0 lb

## 2015-10-16 DIAGNOSIS — D649 Anemia, unspecified: Secondary | ICD-10-CM

## 2015-10-16 DIAGNOSIS — E039 Hypothyroidism, unspecified: Secondary | ICD-10-CM

## 2015-10-16 DIAGNOSIS — Z23 Encounter for immunization: Secondary | ICD-10-CM | POA: Diagnosis not present

## 2015-10-16 DIAGNOSIS — E785 Hyperlipidemia, unspecified: Secondary | ICD-10-CM

## 2015-10-16 DIAGNOSIS — I2510 Atherosclerotic heart disease of native coronary artery without angina pectoris: Secondary | ICD-10-CM

## 2015-10-16 DIAGNOSIS — E1122 Type 2 diabetes mellitus with diabetic chronic kidney disease: Secondary | ICD-10-CM

## 2015-10-16 LAB — COMPREHENSIVE METABOLIC PANEL
ALT: 22 U/L (ref 0–53)
AST: 18 U/L (ref 0–37)
Albumin: 4.1 g/dL (ref 3.5–5.2)
Alkaline Phosphatase: 49 U/L (ref 39–117)
BUN: 31 mg/dL — ABNORMAL HIGH (ref 6–23)
CO2: 25 mEq/L (ref 19–32)
Calcium: 9.4 mg/dL (ref 8.4–10.5)
Chloride: 103 mEq/L (ref 96–112)
Creatinine, Ser: 0.95 mg/dL (ref 0.40–1.50)
GFR: 81.95 mL/min (ref >60.00)
Glucose, Bld: 142 mg/dL — ABNORMAL HIGH (ref 70–99)
Potassium: 4.2 mEq/L (ref 3.5–5.1)
Sodium: 136 mEq/L (ref 135–145)
Total Bilirubin: 0.5 mg/dL (ref 0.2–1.2)
Total Protein: 6.9 g/dL (ref 6.0–8.3)

## 2015-10-16 LAB — LIPID PANEL
Cholesterol: 126 mg/dL (ref 0–200)
HDL: 37.3 mg/dL — ABNORMAL LOW (ref >39.00)
LDL Cholesterol: 64 mg/dL (ref 0–99)
NonHDL: 88.58
Total CHOL/HDL Ratio: 3
Triglycerides: 121 mg/dL (ref 0.0–149.0)
VLDL: 24.2 mg/dL (ref 0.0–40.0)

## 2015-10-16 LAB — CBC
HCT: 39.3 % (ref 39.0–52.0)
Hemoglobin: 12.9 g/dL — ABNORMAL LOW (ref 13.0–17.0)
MCHC: 33 g/dL (ref 30.0–36.0)
MCV: 91.1 fl (ref 78.0–100.0)
Platelets: 252 10*3/uL (ref 150.0–400.0)
RBC: 4.31 Mil/uL (ref 4.22–5.81)
RDW: 14.1 % (ref 11.5–15.5)
WBC: 9.8 10*3/uL (ref 4.0–10.5)

## 2015-10-16 LAB — TSH: TSH: 1.39 u[IU]/mL (ref 0.35–4.50)

## 2015-10-16 LAB — HEMOGLOBIN A1C: Hgb A1c MFr Bld: 7.1 % — ABNORMAL HIGH (ref 4.6–6.5)

## 2015-10-16 NOTE — Patient Instructions (Signed)

## 2015-10-16 NOTE — Assessment & Plan Note (Signed)
On Levothyroxine, continue to monitor 

## 2015-10-16 NOTE — Progress Notes (Signed)
Pre visit review using our clinic review tool, if applicable. No additional management support is needed unless otherwise documented below in the visit note. 

## 2015-10-16 NOTE — Progress Notes (Signed)
Patient ID: Harry Brown, male   DOB: 1940/11/27, 75 y.o.   MRN: [id number]   Subjective:    Patient ID: Harry Brown, male    DOB: 1941-05-25, 75 y.o.   MRN: [id number]  Chief Complaint  Patient presents with  . Follow-up    HPI Patient is in today for follow up. He feels well today. Denies polyuria or polydipsia on an ongoing basis. No recent illness or hospitalization. Denies CP/palp/SOB/HA/congestion/fevers/GI or GU c/o. Taking meds as prescribed  Past Medical History  Diagnosis Date  . Diverticulitis   . CAD (coronary artery disease)   . Hypothyroidism   . ED (erectile dysfunction)   . Other and unspecified hyperlipidemia   . Neck pain on left side 11/22/2012  . Urinary urgency 11/22/2012  . Dizziness   . Hypertension   . Myocardial infarction ([place]) 11/05/1985  . History of sleep apnea     had surgery to correct  . Type II diabetes mellitus ([facility])     fasting 100-120  . Peripheral vascular disease ([place])   . Cellulitis 05/14/2013    RLE  . Postoperative anemia due to acute blood loss 09/24/2013  . Anemia 09/24/2013  . Medicare annual wellness visit, subsequent 04/13/2013  . Glaucoma 08/18/2014  . Penile lesion 02/24/2015    Past Surgical History  Procedure Laterality Date  . Coronary artery bypass graft  2001    "CABG X3" (05/14/2013)  . Palate / uvula biopsy / excision  2003    Removed due to sleep apnea  . Anterior cervical decomp/discectomy fusion  ~ 2000  . Abdominal aortagram  Oct. 9, 2014    Dr. Bridgett Larsson  . Tonsillectomy    . Dental surgery      "multiple teeth removed; trmimed top of mouth so dentures would fit" (05/14/2013)  . Cataract extraction Right   . Femoral-popliteal bypass graft Right 04/25/2013    Procedure: RIGHT FEMORAL TO ABOVE KNEE POPLITEAL BYPASS GRAFT WITH RIGHT GREATER SAPHENOUS VEIN HARVEST; ULTRASOUND GUIDED;  Surgeon: Conrad Marston, MD;  Location: [facility] OR;  Service: Vascular;  Laterality: Right;  . Gum surgery  [phone]    "had bone scraped; had  periodontal disease" (05/14/2013)  . Abdominal aortagram N/A 03/15/2013    Procedure: ABDOMINAL Maxcine Ham;  Surgeon: Conrad Ochlocknee, MD;  Location: [facility] CATH LAB;  Service: Cardiovascular;  Laterality: N/A;  . Eye surgery      cataracts    Family History  Problem Relation Age of Onset  . Hyperlipidemia Mother   . Heart disease Mother   . Diabetes Mother   . Hypertension Mother   . Heart disease Father   . Asthma Father   . Arthritis Father   . Cancer Neg Hx   . Heart disease Sister   . Heart disease Sister     s/p bypass x 2  . Lupus Sister   . Heart disease Brother     MI    Social History   Social History  . Marital Status: Married    Spouse Name: N/A  . Number of Children: 8   . Years of Education: N/A   Occupational History  .     Social History Main Topics  . Smoking status: Former Smoker -- 1.50 packs/day for 50 years    Types: Cigarettes  . Smokeless tobacco: Never Used     Comment: 05/14/2013 "quit smoking in ~ 2004; still Uses Comit lozenges"  . Alcohol Use: No  . Drug Use: No  .  Sexual Activity: Not Currently     Comment: lives with wife, retiring end of next week, no dietary restrictions   Other Topics Concern  . Not on file   Social History Narrative    Outpatient Prescriptions Prior to Visit  Medication Sig Dispense Refill  . aspirin EC 81 MG tablet Take 81 mg by mouth at bedtime.    Marland Kitchen atenolol (TENORMIN) 25 MG tablet TAKE ONE (1) TABLET EACH DAY 30 tablet 2  . atorvastatin (LIPITOR) 40 MG tablet TAKE ONE (1) TABLET BY MOUTH EVERY DAY 30 tablet 0  . finasteride (PROSCAR) 5 MG tablet TAKE ONE (1) TABLET BY MOUTH EVERY DAY 30 tablet 5  . fluconazole (DIFLUCAN) 150 MG tablet Take 1 tablet (150 mg total) by mouth daily. 4 tablet 0  . Lancets (FREESTYLE) lancets Use to check blood sugar once a day.  Dx. 250.00. 100 each 1  . latanoprost (XALATAN) 0.005 % ophthalmic solution Place into both eyes at bedtime.    Marland Kitchen levothyroxine (SYNTHROID, LEVOTHROID) 50 MCG  tablet TAKE ONE (1) TABLET EACH DAY BEFORE BREAKFAST 30 tablet 5  . loratadine (ALAVERT) 10 MG tablet Take 10 mg by mouth daily as needed for allergies.    . metFORMIN (GLUCOPHAGE-XR) 500 MG 24 hr tablet TAKE TWO (2) TABLETS BY MOUTH DAILY WITHBREAKFAST 60 tablet 4  . Multiple Vitamin (MULTIVITAMIN WITH MINERALS) TABS tablet Take 1 tablet by mouth daily.    . ONE TOUCH ULTRA TEST test strip USE TO CHECK BLOOD SUGAR ONCE DAILY 100 each 6  . ONETOUCH DELICA LANCETS 97L MISC     . tamsulosin (FLOMAX) 0.4 MG CAPS capsule TAKE ONE CAPSULE BY MOUTH DAILY 30 capsule 0  . zoster vaccine live, PF, (ZOSTAVAX) 89211 UNT/0.65ML injection Inject 19,400 Units into the skin once. 1 each 0   No facility-administered medications prior to visit.    Allergies  Allergen Reactions  . Adhesive [Tape]   . Latex     rash  . Other Hives    Plastic shopping bags Metal staples: rash, swelling    Review of Systems  Constitutional: Negative for fever and malaise/fatigue.  HENT: Negative for congestion.   Eyes: Negative for blurred vision.  Respiratory: Negative for shortness of breath.   Cardiovascular: Negative for chest pain, palpitations and leg swelling.  Gastrointestinal: Negative for nausea, abdominal pain and blood in stool.  Genitourinary: Negative for dysuria and frequency.  Musculoskeletal: Negative for falls.  Skin: Negative for rash.  Neurological: Negative for dizziness, loss of consciousness and headaches.  Endo/Heme/Allergies: Negative for environmental allergies.  Psychiatric/Behavioral: Negative for depression. The patient is not nervous/anxious.        Objective:    Physical Exam  Constitutional: He is oriented to person, place, and time. He appears well-developed and well-nourished. No distress.  HENT:  Head: Normocephalic and atraumatic.  Nose: Nose normal.  Eyes: Right eye exhibits no discharge. Left eye exhibits no discharge.  Neck: Normal range of motion. Neck supple.    Cardiovascular: Normal rate and regular rhythm.   Murmur heard. Pulmonary/Chest: Effort normal and breath sounds normal.  Abdominal: Soft. Bowel sounds are normal. There is no tenderness.  Musculoskeletal: He exhibits no edema.  Neurological: He is alert and oriented to person, place, and time.  Skin: Skin is warm and dry.  Psychiatric: He has a normal mood and affect.  Nursing note and vitals reviewed.   BP 120/78 mmHg  Pulse 73  Temp(Src) 98.3 F (36.8 C) (Oral)  Ht 6' (  1.829 m)  Wt 228 lb (103.42 kg)  BMI 30.92 kg/m2  SpO2 96% Wt Readings from Last 3 Encounters:  10/16/15 228 lb (103.42 kg)  04/11/15 225 lb (102.059 kg)  02/24/15 227 lb (102.967 kg)     Lab Results  Component Value Date   WBC 9.8 10/16/2015   HGB 12.9* 10/16/2015   HCT 39.3 10/16/2015   PLT 252.0 10/16/2015   GLUCOSE 142* 10/16/2015   CHOL 126 10/16/2015   TRIG 121.0 10/16/2015   HDL 37.30* 10/16/2015   LDLCALC 64 10/16/2015   ALT 22 10/16/2015   AST 18 10/16/2015   NA 136 10/16/2015   K 4.2 10/16/2015   CL 103 10/16/2015   CREATININE 0.95 10/16/2015   BUN 31* 10/16/2015   CO2 25 10/16/2015   TSH 1.39 10/16/2015   PSA 0.58 11/20/2012   INR 0.99 04/20/2013   HGBA1C 7.1* 10/16/2015   MICROALBUR 0.7 02/24/2015    Lab Results  Component Value Date   TSH 1.39 10/16/2015   Lab Results  Component Value Date   WBC 9.8 10/16/2015   HGB 12.9* 10/16/2015   HCT 39.3 10/16/2015   MCV 91.1 10/16/2015   PLT 252.0 10/16/2015   Lab Results  Component Value Date   NA 136 10/16/2015   K 4.2 10/16/2015   CO2 25 10/16/2015   GLUCOSE 142* 10/16/2015   BUN 31* 10/16/2015   CREATININE 0.95 10/16/2015   BILITOT 0.5 10/16/2015   ALKPHOS 49 10/16/2015   AST 18 10/16/2015   ALT 22 10/16/2015   PROT 6.9 10/16/2015   ALBUMIN 4.1 10/16/2015   CALCIUM 9.4 10/16/2015   GFR 81.95 10/16/2015   Lab Results  Component Value Date   CHOL 126 10/16/2015   Lab Results  Component Value Date   HDL  37.30* 10/16/2015   Lab Results  Component Value Date   LDLCALC 64 10/16/2015   Lab Results  Component Value Date   TRIG 121.0 10/16/2015   Lab Results  Component Value Date   CHOLHDL 3 10/16/2015   Lab Results  Component Value Date   HGBA1C 7.1* 10/16/2015       Assessment & Plan:   Problem List Items Addressed This Visit    Need for viral immunization   Relevant Orders   Varicella zoster antibody, IgG (Completed)   Hypothyroidism    On Levothyroxine, continue to monitor      Relevant Orders   Hemoglobin A1c (Completed)   CBC (Completed)   TSH (Completed)   Lipid panel (Completed)   Comprehensive metabolic panel (Completed)   Hyperlipidemia    Tolerating statin, encouraged heart healthy diet, avoid trans fats, minimize simple carbs and saturated fats. Increase exercise as tolerated      Relevant Orders   Hemoglobin A1c (Completed)   CBC (Completed)   TSH (Completed)   Lipid panel (Completed)   Comprehensive metabolic panel (Completed)   DM (diabetes mellitus) (HCC)    hgba1c acceptable, minimize simple carbs. Increase exercise as tolerated. Continue current meds      Relevant Orders   Hemoglobin A1c (Completed)   CBC (Completed)   TSH (Completed)   Lipid panel (Completed)   Comprehensive metabolic panel (Completed)   CAD (coronary artery disease)   Relevant Orders   Hemoglobin A1c (Completed)   CBC (Completed)   TSH (Completed)   Lipid panel (Completed)   Comprehensive metabolic panel (Completed)   Anemia - Primary    Increase leafy greens, consider increased lean red meat and using  cast iron cookware. Continue to monitor, report any concerns      Relevant Orders   Hemoglobin A1c (Completed)   CBC (Completed)   TSH (Completed)   Lipid panel (Completed)   Comprehensive metabolic panel (Completed)      I am having Mr. Talwar maintain his multivitamin with minerals, loratadine, aspirin EC, freestyle, ONETOUCH DELICA LANCETS 41L, latanoprost,  ONE TOUCH ULTRA TEST, zoster vaccine live (PF), fluconazole, metFORMIN, finasteride, atenolol, levothyroxine, atorvastatin, and tamsulosin.  No orders of the defined types were placed in this encounter.     Penni Homans, MD

## 2015-10-17 LAB — VARICELLA ZOSTER ANTIBODY, IGG: Varicella IgG: 671.1 Index — ABNORMAL HIGH (ref <135.00)

## 2015-10-26 NOTE — Assessment & Plan Note (Signed)
Increase leafy greens, consider increased lean red meat and using cast iron cookware. Continue to monitor, report any concerns 

## 2015-10-26 NOTE — Assessment & Plan Note (Signed)
hgba1c acceptable, minimize simple carbs. Increase exercise as tolerated. Continue current meds 

## 2015-10-26 NOTE — Assessment & Plan Note (Signed)
Tolerating statin, encouraged heart healthy diet, avoid trans fats, minimize simple carbs and saturated fats. Increase exercise as tolerated 

## 2015-11-07 ENCOUNTER — Other Ambulatory Visit: Payer: Self-pay | Admitting: Family Medicine

## 2015-11-12 ENCOUNTER — Other Ambulatory Visit: Payer: Self-pay | Admitting: Family Medicine

## 2015-12-03 ENCOUNTER — Other Ambulatory Visit: Payer: Self-pay | Admitting: Family Medicine

## 2015-12-30 ENCOUNTER — Other Ambulatory Visit: Payer: Self-pay | Admitting: Family Medicine

## 2016-02-04 DIAGNOSIS — H401221 Low-tension glaucoma, left eye, mild stage: Secondary | ICD-10-CM | POA: Diagnosis not present

## 2016-02-10 ENCOUNTER — Other Ambulatory Visit: Payer: Self-pay | Admitting: Family Medicine

## 2016-03-04 ENCOUNTER — Ambulatory Visit: Payer: Medicare Other | Admitting: *Deleted

## 2016-03-11 ENCOUNTER — Encounter: Payer: Self-pay | Admitting: Family Medicine

## 2016-03-11 ENCOUNTER — Other Ambulatory Visit: Payer: Self-pay | Admitting: Family Medicine

## 2016-03-11 ENCOUNTER — Ambulatory Visit (INDEPENDENT_AMBULATORY_CARE_PROVIDER_SITE_OTHER): Payer: Medicare Other | Admitting: Family Medicine

## 2016-03-11 VITALS — BP 108/68 | HR 81 | Temp 98.2°F | Ht 72.0 in | Wt 223.1 lb

## 2016-03-11 DIAGNOSIS — E039 Hypothyroidism, unspecified: Secondary | ICD-10-CM

## 2016-03-11 DIAGNOSIS — N400 Enlarged prostate without lower urinary tract symptoms: Secondary | ICD-10-CM

## 2016-03-11 DIAGNOSIS — H9193 Unspecified hearing loss, bilateral: Secondary | ICD-10-CM

## 2016-03-11 DIAGNOSIS — M545 Low back pain: Secondary | ICD-10-CM | POA: Diagnosis not present

## 2016-03-11 DIAGNOSIS — I2510 Atherosclerotic heart disease of native coronary artery without angina pectoris: Secondary | ICD-10-CM

## 2016-03-11 DIAGNOSIS — D649 Anemia, unspecified: Secondary | ICD-10-CM | POA: Diagnosis not present

## 2016-03-11 DIAGNOSIS — Z23 Encounter for immunization: Secondary | ICD-10-CM

## 2016-03-11 DIAGNOSIS — Z0000 Encounter for general adult medical examination without abnormal findings: Secondary | ICD-10-CM

## 2016-03-11 DIAGNOSIS — E785 Hyperlipidemia, unspecified: Secondary | ICD-10-CM

## 2016-03-11 DIAGNOSIS — E1122 Type 2 diabetes mellitus with diabetic chronic kidney disease: Secondary | ICD-10-CM | POA: Diagnosis not present

## 2016-03-11 LAB — COMPREHENSIVE METABOLIC PANEL
ALT: 19 U/L (ref 0–53)
AST: 16 U/L (ref 0–37)
Albumin: 3.6 g/dL (ref 3.5–5.2)
Alkaline Phosphatase: 61 U/L (ref 39–117)
BUN: 28 mg/dL — ABNORMAL HIGH (ref 6–23)
CO2: 28 mEq/L (ref 19–32)
Calcium: 8.9 mg/dL (ref 8.4–10.5)
Chloride: 104 mEq/L (ref 96–112)
Creatinine, Ser: 1.09 mg/dL (ref 0.40–1.50)
GFR: 69.85 mL/min (ref >60.00)
Glucose, Bld: 122 mg/dL — ABNORMAL HIGH (ref 70–99)
Potassium: 4.2 mEq/L (ref 3.5–5.1)
Sodium: 139 mEq/L (ref 135–145)
Total Bilirubin: 0.4 mg/dL (ref 0.2–1.2)
Total Protein: 6.3 g/dL (ref 6.0–8.3)

## 2016-03-11 LAB — PSA: PSA: 0.46 ng/mL (ref 0.10–4.00)

## 2016-03-11 LAB — LIPID PANEL
Cholesterol: 118 mg/dL (ref 0–200)
HDL: 41.5 mg/dL (ref >39.00)
LDL Cholesterol: 60 mg/dL (ref 0–99)
NonHDL: 76.55
Total CHOL/HDL Ratio: 3
Triglycerides: 85 mg/dL (ref 0.0–149.0)
VLDL: 17 mg/dL (ref 0.0–40.0)

## 2016-03-11 LAB — CBC
HCT: 39.8 % (ref 39.0–52.0)
Hemoglobin: 13.3 g/dL (ref 13.0–17.0)
MCHC: 33.3 g/dL (ref 30.0–36.0)
MCV: 91.4 fl (ref 78.0–100.0)
Platelets: 251 10*3/uL (ref 150.0–400.0)
RBC: 4.35 Mil/uL (ref 4.22–5.81)
RDW: 13.9 % (ref 11.5–15.5)
WBC: 9.5 10*3/uL (ref 4.0–10.5)

## 2016-03-11 LAB — HEMOGLOBIN A1C: Hgb A1c MFr Bld: 6.8 % — ABNORMAL HIGH (ref 4.6–6.5)

## 2016-03-11 LAB — TSH: TSH: 2.54 u[IU]/mL (ref 0.35–4.50)

## 2016-03-11 NOTE — Patient Instructions (Addendum)
Bring a copy of your advanced directives to your next office visit.  We'll call you w/ audiology (hearing specialist) appointment.  Preventive Care for Adults, Male A healthy lifestyle and preventive care can promote health and wellness. Preventive health guidelines for men include the following key practices:  A routine yearly physical is a good way to check with your health care provider about your health and preventative screening. It is a chance to share any concerns and updates on your health and to receive a thorough exam.  Visit your dentist for a routine exam and preventative care every 6 months. Brush your teeth twice a day and floss once a day. Good oral hygiene prevents tooth decay and gum disease.  The frequency of eye exams is based on your age, health, family medical history, use of contact lenses, and other factors. Follow your health care provider's recommendations for frequency of eye exams.  Eat a healthy diet. Foods such as vegetables, fruits, whole grains, low-fat dairy products, and lean protein foods contain the nutrients you need without too many calories. Decrease your intake of foods high in solid fats, added sugars, and salt. Eat the right amount of calories for you.Get information about a proper diet from your health care provider, if necessary.  Regular physical exercise is one of the most important things you can do for your health. Most adults should get at least 150 minutes of moderate-intensity exercise (any activity that increases your heart rate and causes you to sweat) each week. In addition, most adults need muscle-strengthening exercises on 2 or more days a week.  Maintain a healthy weight. The body mass index (BMI) is a screening tool to identify possible weight problems. It provides an estimate of body fat based on height and weight. Your health care provider can find your BMI and can help you achieve or maintain a healthy weight.For adults 20 years and  older:  A BMI below 18.5 is considered underweight.  A BMI of 18.5 to 24.9 is normal.  A BMI of 25 to 29.9 is considered overweight.  A BMI of 30 and above is considered obese.  Maintain normal blood lipids and cholesterol levels by exercising and minimizing your intake of saturated fat. Eat a balanced diet with plenty of fruit and vegetables. Blood tests for lipids and cholesterol should begin at age 75 and be repeated every 5 years. If your lipid or cholesterol levels are high, you are over 50, or you are at high risk for heart disease, you may need your cholesterol levels checked more frequently.Ongoing high lipid and cholesterol levels should be treated with medicines if diet and exercise are not working.  If you smoke, find out from your health care provider how to quit. If you do not use tobacco, do not start.  Lung cancer screening is recommended for adults aged 14-80 years who are at high risk for developing lung cancer because of a history of smoking. A yearly low-dose CT scan of the lungs is recommended for people who have at least a 30-pack-year history of smoking and are a current smoker or have quit within the past 15 years. A pack year of smoking is smoking an average of 1 pack of cigarettes a day for 1 year (for example: 1 pack a day for 30 years or 2 packs a day for 15 years). Yearly screening should continue until the smoker has stopped smoking for at least 15 years. Yearly screening should be stopped for people who develop a  health problem that would prevent them from having lung cancer treatment.  If you choose to drink alcohol, do not have more than 2 drinks per day. One drink is considered to be 12 ounces (355 mL) of beer, 5 ounces (148 mL) of wine, or 1.5 ounces (44 mL) of liquor.  Avoid use of street drugs. Do not share needles with anyone. Ask for help if you need support or instructions about stopping the use of drugs.  High blood pressure causes heart disease and  increases the risk of stroke. Your blood pressure should be checked at least every 1-2 years. Ongoing high blood pressure should be treated with medicines, if weight loss and exercise are not effective.  If you are 55-46 years old, ask your health care provider if you should take aspirin to prevent heart disease.  Diabetes screening is done by taking a blood sample to check your blood glucose level after you have not eaten for a certain period of time (fasting). If you are not overweight and you do not have risk factors for diabetes, you should be screened once every 3 years starting at age 12. If you are overweight or obese and you are 13-7 years of age, you should be screened for diabetes every year as part of your cardiovascular risk assessment.  Colorectal cancer can be detected and often prevented. Most routine colorectal cancer screening begins at the age of 18 and continues through age 32. However, your health care provider may recommend screening at an earlier age if you have risk factors for colon cancer. On a yearly basis, your health care provider may provide home test kits to check for hidden blood in the stool. Use of a small camera at the end of a tube to directly examine the colon (sigmoidoscopy or colonoscopy) can detect the earliest forms of colorectal cancer. Talk to your health care provider about this at age 14, when routine screening begins. Direct exam of the colon should be repeated every 5-10 years through age 33, unless early forms of precancerous polyps or small growths are found.  People who are at an increased risk for hepatitis B should be screened for this virus. You are considered at high risk for hepatitis B if:  You were born in a country where hepatitis B occurs often. Talk with your health care provider about which countries are considered high risk.  Your parents were born in a high-risk country and you have not received a shot to protect against hepatitis B  (hepatitis B vaccine).  You have HIV or AIDS.  You use needles to inject street drugs.  You live with, or have sex with, someone who has hepatitis B.  You are a man who has sex with other men (MSM).  You get hemodialysis treatment.  You take certain medicines for conditions such as cancer, organ transplantation, and autoimmune conditions.  Hepatitis C blood testing is recommended for all people born from [birth year] through 1965 and any individual with known risks for hepatitis C.  Practice safe sex. Use condoms and avoid high-risk sexual practices to reduce the spread of sexually transmitted infections (STIs). STIs include gonorrhea, chlamydia, syphilis, trichomonas, herpes, HPV, and human immunodeficiency virus (HIV). Herpes, HIV, and HPV are viral illnesses that have no cure. They can result in disability, cancer, and death.  If you are a man who has sex with other men, you should be screened at least once per year for:  HIV.  Urethral, rectal, and pharyngeal infection of  gonorrhea, chlamydia, or both.  If you are at risk of being infected with HIV, it is recommended that you take a prescription medicine daily to prevent HIV infection. This is called preexposure prophylaxis (PrEP). You are considered at risk if:  You are a man who has sex with other men (MSM) and have other risk factors.  You are a heterosexual man, are sexually active, and are at increased risk for HIV infection.  You take drugs by injection.  You are sexually active with a partner who has HIV.  Talk with your health care provider about whether you are at high risk of being infected with HIV. If you choose to begin PrEP, you should first be tested for HIV. You should then be tested every 3 months for as long as you are taking PrEP.  A one-time screening for abdominal aortic aneurysm (AAA) and surgical repair of large AAAs by ultrasound are recommended for men ages 64 to 43 years who are current or former  smokers.  Healthy men should no longer receive prostate-specific antigen (PSA) blood tests as part of routine cancer screening. Talk with your health care provider about prostate cancer screening.  Testicular cancer screening is not recommended for adult males who have no symptoms. Screening includes self-exam, a health care provider exam, and other screening tests. Consult with your health care provider about any symptoms you have or any concerns you have about testicular cancer.  Use sunscreen. Apply sunscreen liberally and repeatedly throughout the day. You should seek shade when your shadow is shorter than you. Protect yourself by wearing long sleeves, pants, a wide-brimmed hat, and sunglasses year round, whenever you are outdoors.  Once a month, do a whole-body skin exam, using a mirror to look at the skin on your back. Tell your health care provider about new moles, moles that have irregular borders, moles that are larger than a pencil eraser, or moles that have changed in shape or color.  Stay current with required vaccines (immunizations).  Influenza vaccine. All adults should be immunized every year.  Tetanus, diphtheria, and acellular pertussis (Td, Tdap) vaccine. An adult who has not previously received Tdap or who does not know his vaccine status should receive 1 dose of Tdap. This initial dose should be followed by tetanus and diphtheria toxoids (Td) booster doses every 10 years. Adults with an unknown or incomplete history of completing a 3-dose immunization series with Td-containing vaccines should begin or complete a primary immunization series including a Tdap dose. Adults should receive a Td booster every 10 years.  Varicella vaccine. An adult without evidence of immunity to varicella should receive 2 doses or a second dose if he has previously received 1 dose.  Human papillomavirus (HPV) vaccine. Males aged 11-21 years who have not received the vaccine previously should receive  the 3-dose series. Males aged 22-26 years may be immunized. Immunization is recommended through the age of 14 years for any male who has sex with males and did not get any or all doses earlier. Immunization is recommended for any person with an immunocompromised condition through the age of 8 years if he did not get any or all doses earlier. During the 3-dose series, the second dose should be obtained 4-8 weeks after the first dose. The third dose should be obtained 24 weeks after the first dose and 16 weeks after the second dose.  Zoster vaccine. One dose is recommended for adults aged 29 years or older unless certain conditions are present.  Measles, mumps, and rubella (MMR) vaccine. Adults born before [birth year] generally are considered immune to measles and mumps. Adults born in [birth year] or later should have 1 or more doses of MMR vaccine unless there is a contraindication to the vaccine or there is laboratory evidence of immunity to each of the three diseases. A routine second dose of MMR vaccine should be obtained at least 28 days after the first dose for students attending postsecondary schools, health care workers, or international travelers. People who received inactivated measles vaccine or an unknown type of measles vaccine during 1963-1967 should receive 2 doses of MMR vaccine. People who received inactivated mumps vaccine or an unknown type of mumps vaccine before 1979 and are at high risk for mumps infection should consider immunization with 2 doses of MMR vaccine. Unvaccinated health care workers born before [birth year] who lack laboratory evidence of measles, mumps, or rubella immunity or laboratory confirmation of disease should consider measles and mumps immunization with 2 doses of MMR vaccine or rubella immunization with 1 dose of MMR vaccine.  Pneumococcal 13-valent conjugate (PCV13) vaccine. When indicated, a person who is uncertain of his immunization history and has no record of immunization should  receive the PCV13 vaccine. All adults 52 years of age and older should receive this vaccine. An adult aged 85 years or older who has certain medical conditions and has not been previously immunized should receive 1 dose of PCV13 vaccine. This PCV13 should be followed with a dose of pneumococcal polysaccharide (PPSV23) vaccine. Adults who are at high risk for pneumococcal disease should obtain the PPSV23 vaccine at least 8 weeks after the dose of PCV13 vaccine. Adults older than 76 years of age who have normal immune system function should obtain the PPSV23 vaccine dose at least 1 year after the dose of PCV13 vaccine.  Pneumococcal polysaccharide (PPSV23) vaccine. When PCV13 is also indicated, PCV13 should be obtained first. All adults aged 72 years and older should be immunized. An adult younger than age 72 years who has certain medical conditions should be immunized. Any person who resides in a nursing home or long-term care facility should be immunized. An adult smoker should be immunized. People with an immunocompromised condition and certain other conditions should receive both PCV13 and PPSV23 vaccines. People with human immunodeficiency virus (HIV) infection should be immunized as soon as possible after diagnosis. Immunization during chemotherapy or radiation therapy should be avoided. Routine use of PPSV23 vaccine is not recommended for American Indians, [address], or people younger than 65 years unless there are medical conditions that require PPSV23 vaccine. When indicated, people who have unknown immunization and have no record of immunization should receive PPSV23 vaccine. One-time revaccination 5 years after the first dose of PPSV23 is recommended for people aged 19-64 years who have chronic kidney failure, nephrotic syndrome, asplenia, or immunocompromised conditions. People who received 1-2 doses of PPSV23 before age 68 years should receive another dose of PPSV23 vaccine at age 6 years or later  if at least 5 years have passed since the previous dose. Doses of PPSV23 are not needed for people immunized with PPSV23 at or after age 52 years.  Meningococcal vaccine. Adults with asplenia or persistent complement component deficiencies should receive 2 doses of quadrivalent meningococcal conjugate (MenACWY-D) vaccine. The doses should be obtained at least 2 months apart. Microbiologists working with certain meningococcal bacteria, military recruits, people at risk during an outbreak, and people who travel to or live in countries with a high rate of meningitis  should be immunized. A first-year college student up through age 34 years who is living in a residence hall should receive a dose if he did not receive a dose on or after his 16th birthday. Adults who have certain high-risk conditions should receive one or more doses of vaccine.  Hepatitis A vaccine. Adults who wish to be protected from this disease, have chronic liver disease, work with hepatitis A-infected animals, work in hepatitis A research labs, or travel to or work in countries with a high rate of hepatitis A should be immunized. Adults who were previously unvaccinated and who anticipate close contact with an international adoptee during the first 60 days after arrival in the [place] from a country with a high rate of hepatitis A should be immunized.  Hepatitis B vaccine. Adults should be immunized if they wish to be protected from this disease, are under age 24 years and have diabetes, have chronic liver disease, have had more than one sex partner in the past 6 months, may be exposed to blood or other infectious body fluids, are household contacts or sex partners of hepatitis B positive people, are clients or workers in certain care facilities, or travel to or work in countries with a high rate of hepatitis B.  Haemophilus influenzae type b (Hib) vaccine. A previously unvaccinated person with asplenia or sickle cell disease or having a  scheduled splenectomy should receive 1 dose of Hib vaccine. Regardless of previous immunization, a recipient of a hematopoietic stem cell transplant should receive a 3-dose series 6-12 months after his successful transplant. Hib vaccine is not recommended for adults with HIV infection. Preventive Service / Frequency Ages 6 to 68  Blood pressure check.** / Every 3-5 years.  Lipid and cholesterol check.** / Every 5 years beginning at age 30.  Hepatitis C blood test.** / For any individual with known risks for hepatitis C.  Skin self-exam. / Monthly.  Influenza vaccine. / Every year.  Tetanus, diphtheria, and acellular pertussis (Tdap, Td) vaccine.** / Consult your health care provider. 1 dose of Td every 10 years.  Varicella vaccine.** / Consult your health care provider.  HPV vaccine. / 3 doses over 6 months, if 11 or younger.  Measles, mumps, rubella (MMR) vaccine.** / You need at least 1 dose of MMR if you were born in 1957 or later. You may also need a second dose.  Pneumococcal 13-valent conjugate (PCV13) vaccine.** / Consult your health care provider.  Pneumococcal polysaccharide (PPSV23) vaccine.** / 1 to 2 doses if you smoke cigarettes or if you have certain conditions.  Meningococcal vaccine.** / 1 dose if you are age 8 to 14 years and a Market researcher living in a residence hall, or have one of several medical conditions. You may also need additional booster doses.  Hepatitis A vaccine.** / Consult your health care provider.  Hepatitis B vaccine.** / Consult your health care provider.  Haemophilus influenzae type b (Hib) vaccine.** / Consult your health care provider. Ages 61 to 45  Blood pressure check.** / Every year.  Lipid and cholesterol check.** / Every 5 years beginning at age 46.  Lung cancer screening. / Every year if you are aged 86-80 years and have a 30-pack-year history of smoking and currently smoke or have quit within the past 15 years.  Yearly screening is stopped once you have quit smoking for at least 15 years or develop a health problem that would prevent you from having lung cancer treatment.  Fecal occult blood  test (FOBT) of stool. / Every year beginning at age 86 and continuing until age 72. You may not have to do this test if you get a colonoscopy every 10 years.  Flexible sigmoidoscopy** or colonoscopy.** / Every 5 years for a flexible sigmoidoscopy or every 10 years for a colonoscopy beginning at age 71 and continuing until age 79.  Hepatitis C blood test.** / For all people born from [birth year] through 1965 and any individual with known risks for hepatitis C.  Skin self-exam. / Monthly.  Influenza vaccine. / Every year.  Tetanus, diphtheria, and acellular pertussis (Tdap/Td) vaccine.** / Consult your health care provider. 1 dose of Td every 10 years.  Varicella vaccine.** / Consult your health care provider.  Zoster vaccine.** / 1 dose for adults aged 23 years or older.  Measles, mumps, rubella (MMR) vaccine.** / You need at least 1 dose of MMR if you were born in 1958 or later. You may also need a second dose.  Pneumococcal 13-valent conjugate (PCV13) vaccine.** / Consult your health care provider.  Pneumococcal polysaccharide (PPSV23) vaccine.** / 1 to 2 doses if you smoke cigarettes or if you have certain conditions.  Meningococcal vaccine.** / Consult your health care provider.  Hepatitis A vaccine.** / Consult your health care provider.  Hepatitis B vaccine.** / Consult your health care provider.  Haemophilus influenzae type b (Hib) vaccine.** / Consult your health care provider. Ages 68 and over  Blood pressure check.** / Every year.  Lipid and cholesterol check.**/ Every 5 years beginning at age 7.  Lung cancer screening. / Every year if you are aged 51-80 years and have a 30-pack-year history of smoking and currently smoke or have quit within the past 15 years. Yearly screening is stopped once you  have quit smoking for at least 15 years or develop a health problem that would prevent you from having lung cancer treatment.  Fecal occult blood test (FOBT) of stool. / Every year beginning at age 45 and continuing until age 63. You may not have to do this test if you get a colonoscopy every 10 years.  Flexible sigmoidoscopy** or colonoscopy.** / Every 5 years for a flexible sigmoidoscopy or every 10 years for a colonoscopy beginning at age 70 and continuing until age 75.  Hepatitis C blood test.** / For all people born from [birth year] through 1966 and any individual with known risks for hepatitis C.  Abdominal aortic aneurysm (AAA) screening.** / A one-time screening for ages 48 to 78 years who are current or former smokers.  Skin self-exam. / Monthly.  Influenza vaccine. / Every year.  Tetanus, diphtheria, and acellular pertussis (Tdap/Td) vaccine.** / 1 dose of Td every 10 years.  Varicella vaccine.** / Consult your health care provider.  Zoster vaccine.** / 1 dose for adults aged 18 years or older.  Pneumococcal 13-valent conjugate (PCV13) vaccine.** / 1 dose for all adults aged 77 years and older.  Pneumococcal polysaccharide (PPSV23) vaccine.** / 1 dose for all adults aged 53 years and older.  Meningococcal vaccine.** / Consult your health care provider.  Hepatitis A vaccine.** / Consult your health care provider.  Hepatitis B vaccine.** / Consult your health care provider.  Haemophilus influenzae type b (Hib) vaccine.** / Consult your health care provider. **Family history and personal history of risk and conditions may change your health care provider's recommendations.   This information is not intended to replace advice given to you by your health care provider. Make sure you discuss any questions you  have with your health care provider.   Document Released: 07/20/2001 Document Revised: 06/14/2014 Document Reviewed: 10/19/2010 [employer] Interactive Patient Education [employer]

## 2016-03-11 NOTE — Progress Notes (Signed)
Pre visit review using our clinic review tool, if applicable. No additional management support is needed unless otherwise documented below in the visit note. 

## 2016-03-11 NOTE — Assessment & Plan Note (Signed)
On Levothyroxine, continue to monitor 

## 2016-03-11 NOTE — Assessment & Plan Note (Signed)
hgba1c acceptable, minimize simple carbs. Increase exercise as tolerated. Continue current meds 

## 2016-03-11 NOTE — Progress Notes (Signed)
Subjective:   Harry Brown is a 75 y.o. male who presents for Medicare Annual/Subsequent preventive examination.  Review of Systems:  No ROS.  Medicare Wellness Visit.  Cardiac Risk Factors include: advanced age (>28mn, >>6women);diabetes mellitus;obesity (BMI >30kg/m2);male gender;sedentary lifestyle;dyslipidemia  Sleep patterns: No sleep issues. Sleeps 6-7 hrs nightly. Feels rested on waking. Gets up once nightly or less to void. Home Safety/Smoke Alarms: Lives in home w/ wife. A few steps in from garage, no difficulty navigating steps. Feels safe in home. Smoke alarms present. No grab bars in shower.   Living environment; residence and Firearm Safety: Stored in a safe place. Seat Belt Safety/Bike Helmet: Wears seat belt.   Counseling:   Eye Exam- Sees Dr. DBing Plumeevery 3 months. Wearing glasses today. States has beginning of glaucoma. No vision issues. Dental- Dr. NDocia Barrier periodontist. Wears dentures upper and lower. Brushes gums twice daily.  Male:   CCS- Not on file    PSA-  Lab Results  Component Value Date   PSA 0.58 11/20/2012       Objective:    Vitals: BP 108/68 (BP Location: Left Arm, Patient Position: Sitting, Cuff Size: Large)   Pulse 81   Temp 98.2 F (36.8 C) (Oral)   Ht 6' (1.829 m)   Wt 223 lb 2 oz (101.2 kg)   SpO2 98%   BMI 30.26 kg/m   Body mass index is 30.26 kg/m.  Tobacco History  Smoking Status  . Former Smoker  . Packs/day: 1.50  . Years: 50.00  . Types: Cigarettes  Smokeless Tobacco  . Never Used    Comment: 05/14/2013 "quit smoking in ~ 2004; still Uses Comit lozenges"     Counseling given: Yes   Past Medical History:  Diagnosis Date  . Anemia 09/24/2013  . CAD (coronary artery disease)   . Cellulitis 05/14/2013   RLE  . Diverticulitis   . Dizziness   . ED (erectile dysfunction)   . Glaucoma 08/18/2014  . History of sleep apnea    had surgery to correct  . Hypertension   . Hypothyroidism   . Medicare annual wellness  visit, subsequent 04/13/2013  . Myocardial infarction 11/05/1985  . Neck pain on left side 11/22/2012  . Other and unspecified hyperlipidemia   . Penile lesion 02/24/2015  . [place]   . Postoperative anemia due to acute blood loss 09/24/2013  . Type II diabetes mellitus (HCC)    fasting 100-120  . Urinary urgency 11/22/2012   Past Surgical History:  Procedure Laterality Date  . ABDOMINAL AORTAGRAM  Oct. 9, 2014   Dr. CBridgett Larsson . ABDOMINAL AORTAGRAM N/A 03/15/2013   Procedure: ABDOMINAL AMaxcine Ham  Surgeon: BConrad [place] MD;  [facility];  Service: Cardiovascular;  Laterality: N/A;  . ANTERIOR CERVICAL DECOMP/DISCECTOMY FUSION  ~ 2000  . CATARACT EXTRACTION Right   . CORONARY ARTERY BYPASS GRAFT  2001   "CABG X3" (05/14/2013)  . DENTAL SURGERY     "multiple teeth removed; trmimed top of mouth so dentures would fit" (05/14/2013)  . EYE SURGERY     cataracts  . FEMORAL-POPLITEAL BYPASS GRAFT Right 04/25/2013   Procedure: RIGHT FEMORAL TO ABOVE KNEE POPLITEAL BYPASS GRAFT WITH RIGHT GREATER SAPHENOUS VEIN HARVEST; ULTRASOUND GUIDED;  Surgeon: BConrad [place] MD;  [facility];  Service: Vascular;  Laterality: Right;  . GUM SURGERY  [phone]  "had bone scraped; had periodontal disease" (05/14/2013)  . PALATE / UVULA BIOPSY / EXCISION  2003   Removed due to sleep apnea  . TONSILLECTOMY     Family History  Problem Relation Age of Onset  . Hyperlipidemia Mother   . Heart disease Mother   . Diabetes Mother   . Hypertension Mother   . Heart disease Father   . Asthma Father   . Arthritis Father   . Cancer Neg Hx   . Heart disease Sister   . Heart disease Sister     s/p bypass x 2  . Lupus Sister   . Heart disease Brother     MI   History  Sexual Activity  . Sexual activity: Not Currently    Comment: lives with wife, retiring end of next week, no dietary restrictions    Outpatient Encounter Prescriptions as of 03/11/2016  Medication Sig  . aspirin EC  81 MG tablet Take 81 mg by mouth at bedtime.  Marland Kitchen atenolol (TENORMIN) 25 MG tablet TAKE ONE (1) TABLET EACH DAY  . atorvastatin (LIPITOR) 40 MG tablet TAKE ONE (1) TABLET BY MOUTH EVERY DAY  . cholecalciferol (VITAMIN D) 1000 units tablet Take 1,000 Units by mouth daily.  . finasteride (PROSCAR) 5 MG tablet TAKE ONE (1) TABLET BY MOUTH EVERY DAY  . fluconazole (DIFLUCAN) 150 MG tablet Take 1 tablet (150 mg total) by mouth daily.  . Lancets (FREESTYLE) lancets Use to check blood sugar once a day.  Dx. 250.00.  Marland Kitchen latanoprost (XALATAN) 0.005 % ophthalmic solution Place into both eyes at bedtime.  Marland Kitchen levothyroxine (SYNTHROID, LEVOTHROID) 50 MCG tablet TAKE ONE (1) TABLET EACH DAY BEFORE BREAKFAST  . loratadine (ALAVERT) 10 MG tablet Take 10 mg by mouth daily as needed for allergies.  . metFORMIN (GLUCOPHAGE-XR) 500 MG 24 hr tablet TAKE TWO (2) TABLETS BY MOUTH DAILY WITHBREAKFAST  . Multiple Vitamin (MULTIVITAMIN WITH MINERALS) TABS tablet Take 1 tablet by mouth daily.  . ONE TOUCH ULTRA TEST test strip USE TO CHECK BLOOD SUGAR ONCE DAILY  . ONETOUCH DELICA LANCETS 50Y MISC   . tamsulosin (FLOMAX) 0.4 MG CAPS capsule TAKE ONE (1) CAPSULE EACH DAY  . zoster vaccine live, PF, (ZOSTAVAX) 77412 UNT/0.65ML injection Inject 19,400 Units into the skin once.  . [DISCONTINUED] metFORMIN (GLUCOPHAGE-XR) 500 MG 24 hr tablet TAKE TWO (2) TABLETS BY MOUTH DAILY WITHBREAKFAST   No facility-administered encounter medications on file as of 03/11/2016.     Activities of Daily Living In your present state of health, do you have any difficulty performing the following activities: 03/11/2016  Hearing? N  Vision? N  Difficulty concentrating or making decisions? N  Walking or climbing stairs? Y  Dressing or bathing? N  Doing errands, shopping? N  Preparing Food and eating ? N  Using the Toilet? N  In the past six months, have you accidently leaked urine? N  Do you have problems with loss of bowel control? N    Managing your Medications? N  Managing your Finances? N  Housekeeping or managing your Housekeeping? N  Some recent data might be hidden    Patient Care Team: [name], MD as PCP - General (Family Medicine) Lelon Perla, MD as Consulting Physician (Cardiology) Conrad , MD as Consulting Physician (Vascular Surgery) Calvert Cantor, MD as Consulting Physician (Ophthalmology) Malachy Chamber, DDS as Consulting Physician (Periodontics)   Assessment:    Physical assessment deferred to PCP.  Exercise Activities and Dietary recommendations Current Exercise Habits: The patient does not participate in regular exercise at present, Exercise limited by: None  identified  Diet (meal preparation, eat out, water intake, caffeinated beverages, dairy products, fruits and vegetables): Drinks 3-4 cups coffee in the morning. Drinks several bottles of water throughout the day. 'I just eat normal stuff.'  24 hour dietary recall: Breakfast: Breakfast shake from [employer]. Lunch: Sandwich Dinner: Smoked sausage and macaroni w/ tomatoes.      Goals    . Healthy Lifestyle          Continue to eat heart healthy diet (full of fruits, vegetables, whole grains, lean protein, water--limit salt, fat, and sugar intake) and increase physical activity as tolerated. Continue doing brain stimulating activities (puzzles, reading, adult coloring books, staying active) to keep memory sharp.        Fall Risk Fall Risk  03/11/2016 02/24/2015 01/29/2014  Falls in the past year? No Yes No  Number falls in past yr: - 1 -  Injury with Fall? - No -   Depression Screen PHQ 2/9 Scores 03/11/2016 02/24/2015 01/29/2014  PHQ - 2 Score 0 0 0    Cognitive Testing MMSE - Mini Mental State Exam 03/11/2016  Orientation to time 5  Orientation to Place 5  Registration 3  Attention/ Calculation 5  Recall 3  Language- name 2 objects 2  Language- repeat 1  Language- follow 3 step command 3  Language- read & follow  direction 1  Write a sentence 1  Copy design 1  Total score 30    Immunization History  Administered Date(s) Administered  . Influenza Split 03/31/2012  . Influenza, High Dose Seasonal PF 03/20/2013, 02/24/2015, 03/11/2016  . Influenza,inj,Quad PF,36+ Mos 01/29/2014  . Pneumococcal Conjugate-13 03/20/2013  . Pneumococcal Polysaccharide-23 02/24/2015  . Td 06/07/2009   Screening Tests Health Maintenance  Topic Date Due  . FOOT EXAM  11/23/1949  . ZOSTAVAX  11/24/1999  . OPHTHALMOLOGY EXAM  03/27/2015  . URINE MICROALBUMIN  02/24/2016  . HEMOGLOBIN A1C  04/17/2016  . TETANUS/TDAP  06/08/2019  . INFLUENZA VACCINE  Completed  . PNA vac Low Risk Adult  Completed      Plan:   Follow-up w/ Dr. Charlett Blake as scheduled.  Bring a copy of your advanced directives to your next office visit.  Audiology referral for failed hearing screen and failed whisper test.  Most recent diabetic eye exam requested from Dr. Rachael Fee office.   During the course of the visit the patient was educated and counseled about the following appropriate screening and preventive services:   Vaccines to include Pneumoccal, Influenza, Hepatitis B, Td, Zostavax, HCV  Cardiovascular Disease  Diabetes screening  Prostate Cancer Screening  Glaucoma screening  Nutrition counseling   Patient Instructions (the written plan) was given to the patient.    Dorrene German, RN  03/11/2016  RN awv note reviewed. Agree with documention and plan. Penni Homans, MD

## 2016-03-12 LAB — MICROALBUMIN / CREATININE URINE RATIO
Creatinine,U: 106.6 mg/dL
Microalb Creat Ratio: 0.7 mg/g (ref 0.0–30.0)
Microalb, Ur: 0.7 mg/dL (ref 0.0–1.9)

## 2016-03-13 ENCOUNTER — Encounter: Payer: Self-pay | Admitting: Family Medicine

## 2016-03-13 DIAGNOSIS — Z0000 Encounter for general adult medical examination without abnormal findings: Secondary | ICD-10-CM

## 2016-03-13 HISTORY — DX: Encounter for general adult medical examination without abnormal findings: Z00.00

## 2016-03-13 NOTE — Assessment & Plan Note (Signed)
Tolerating statin, encouraged heart healthy diet, avoid trans fats, minimize simple carbs and saturated fats. Increase exercise as tolerated 

## 2016-03-13 NOTE — Assessment & Plan Note (Signed)
Patient encouraged to maintain heart healthy diet, regular exercise, adequate sleep. Consider daily probiotics. Take medications as prescribed. Given and reviewed copy of ACP documents from [place] Secretary of State and encouraged to complete and return 

## 2016-03-13 NOTE — Progress Notes (Signed)
Patient ID: Harry Brown, male   DOB: 1940-07-12, 75 y.o.   MRN: [id number]   Subjective:    Patient ID: Harry Brown, male    DOB: 21-May-1941, 75 y.o.   MRN: [id number]  Chief Complaint  Patient presents with  . Annual Exam    HPI Patient is in today for annual preventative exam. He feels well today but has been noting some episodes of feeling weak and woozey after walling some times. Resolves quickly with rest. Denies CP/palp/SOB/HA/congestion/fevers/GI or GU c/o. Taking meds as prescribed  Past Medical History:  Diagnosis Date  . Anemia 09/24/2013  . CAD (coronary artery disease)   . Cellulitis 05/14/2013   RLE  . Diverticulitis   . Dizziness   . ED (erectile dysfunction)   . Glaucoma 08/18/2014  . History of sleep apnea    had surgery to correct  . Hypertension   . Hypothyroidism   . Medicare annual wellness visit, subsequent 04/13/2013  . Myocardial infarction 11/05/1985  . Neck pain on left side 11/22/2012  . Other and unspecified hyperlipidemia   . Penile lesion 02/24/2015  . Peripheral vascular disease ([place])   . Postoperative anemia due to acute blood loss 09/24/2013  . Preventative health care 03/13/2016  . Type II diabetes mellitus (HCC)    fasting 100-120  . Urinary urgency 11/22/2012    Past Surgical History:  Procedure Laterality Date  . ABDOMINAL AORTAGRAM  Oct. 9, 2014   Dr. Bridgett Larsson  . ABDOMINAL AORTAGRAM N/A 03/15/2013   Procedure: ABDOMINAL Maxcine Ham;  Surgeon: Conrad [place], MD;  [facility];  Service: Cardiovascular;  Laterality: N/A;  . ANTERIOR CERVICAL DECOMP/DISCECTOMY FUSION  ~ 2000  . CATARACT EXTRACTION Right   . CORONARY ARTERY BYPASS GRAFT  2001   "CABG X3" (05/14/2013)  . DENTAL SURGERY     "multiple teeth removed; trmimed top of mouth so dentures would fit" (05/14/2013)  . EYE SURGERY     cataracts  . FEMORAL-POPLITEAL BYPASS GRAFT Right 04/25/2013   Procedure: RIGHT FEMORAL TO ABOVE KNEE POPLITEAL BYPASS GRAFT WITH RIGHT GREATER  SAPHENOUS VEIN HARVEST; ULTRASOUND GUIDED;  Surgeon: Conrad Patrick, MD;  [facility];  Service: Vascular;  Laterality: Right;  . GUM SURGERY  [phone]   "had bone scraped; had periodontal disease" (05/14/2013)  . PALATE / UVULA BIOPSY / EXCISION  2003   Removed due to sleep apnea  . TONSILLECTOMY      Family History  Problem Relation Age of Onset  . Hyperlipidemia Mother   . Heart disease Mother   . Diabetes Mother   . Hypertension Mother   . Heart disease Father   . Asthma Father   . Arthritis Father   . Cancer Neg Hx   . Heart disease Sister   . Heart disease Sister     s/p bypass x 2  . Lupus Sister   . Heart disease Brother     MI    Social History   Social History  . Marital status: Married    Spouse name: N/A  . Number of children: 8   . Years of education: N/A   Occupational History  .  [employer]   Social History Main Topics  . Smoking status: Former Smoker    Packs/day: 1.50    Years: 50.00    Types: Cigarettes  . Smokeless tobacco: Never Used     Comment: 05/14/2013 "quit smoking in ~ 2004; still Uses Comit lozenges"  . Alcohol  use No  . Drug use: No  . Sexual activity: Not Currently     Comment: lives with wife, retiring end of next week, no dietary restrictions   Other Topics Concern  . Not on file   Social History Narrative  . No narrative on file    Outpatient Medications Prior to Visit  Medication Sig Dispense Refill  . aspirin EC 81 MG tablet Take 81 mg by mouth at bedtime.    Marland Kitchen atenolol (TENORMIN) 25 MG tablet TAKE ONE (1) TABLET EACH DAY 30 tablet 5  . atorvastatin (LIPITOR) 40 MG tablet TAKE ONE (1) TABLET BY MOUTH EVERY DAY 30 tablet 5  . finasteride (PROSCAR) 5 MG tablet TAKE ONE (1) TABLET BY MOUTH EVERY DAY 30 tablet 5  . fluconazole (DIFLUCAN) 150 MG tablet Take 1 tablet (150 mg total) by mouth daily. 4 tablet 0  . Lancets (FREESTYLE) lancets Use to check blood sugar once a day.  Dx. 250.00. 100 each 1  . latanoprost  (XALATAN) 0.005 % ophthalmic solution Place into both eyes at bedtime.    Marland Kitchen levothyroxine (SYNTHROID, LEVOTHROID) 50 MCG tablet TAKE ONE (1) TABLET EACH DAY BEFORE BREAKFAST 30 tablet 5  . loratadine (ALAVERT) 10 MG tablet Take 10 mg by mouth daily as needed for allergies.    . metFORMIN (GLUCOPHAGE-XR) 500 MG 24 hr tablet TAKE TWO (2) TABLETS BY MOUTH DAILY WITHBREAKFAST 60 tablet 5  . Multiple Vitamin (MULTIVITAMIN WITH MINERALS) TABS tablet Take 1 tablet by mouth daily.    . ONE TOUCH ULTRA TEST test strip USE TO CHECK BLOOD SUGAR ONCE DAILY 100 each 6  . ONETOUCH DELICA LANCETS 76H MISC     . tamsulosin (FLOMAX) 0.4 MG CAPS capsule TAKE ONE (1) CAPSULE EACH DAY 30 capsule 3  . zoster vaccine live, PF, (ZOSTAVAX) 60737 UNT/0.65ML injection Inject 19,400 Units into the skin once. 1 each 0  . metFORMIN (GLUCOPHAGE-XR) 500 MG 24 hr tablet TAKE TWO (2) TABLETS BY MOUTH DAILY WITHBREAKFAST 60 tablet 4   No facility-administered medications prior to visit.     Allergies  Allergen Reactions  . Adhesive [Tape]   . Latex     rash  . Other Hives    Plastic shopping bags Metal staples: rash, swelling    Review of Systems  Constitutional: Negative for chills, fever and malaise/fatigue.  HENT: Negative for congestion and hearing loss.   Eyes: Negative for discharge.  Respiratory: Negative for cough, sputum production and shortness of breath.   Cardiovascular: Negative for chest pain, palpitations and leg swelling.  Gastrointestinal: Negative for abdominal pain, blood in stool, constipation, diarrhea, heartburn, nausea and vomiting.  Genitourinary: Negative for dysuria, frequency, hematuria and urgency.  Musculoskeletal: Negative for back pain, falls and myalgias.  Skin: Negative for rash.  Neurological: Negative for dizziness, sensory change, loss of consciousness, weakness and headaches.  Endo/Heme/Allergies: Negative for environmental allergies. Does not bruise/bleed easily.    Psychiatric/Behavioral: Negative for depression and suicidal ideas. The patient is not nervous/anxious and does not have insomnia.        Objective:    Physical Exam  Constitutional: He is oriented to person, place, and time. He appears well-developed and well-nourished. No distress.  HENT:  Head: Normocephalic and atraumatic.  Nose: Nose normal.  Eyes: Right eye exhibits no discharge. Left eye exhibits no discharge.  Neck: Normal range of motion. Neck supple.  Cardiovascular: Normal rate and regular rhythm.   Pulmonary/Chest: Effort normal and breath sounds normal.  Abdominal: Soft.  Bowel sounds are normal. There is no tenderness.  Musculoskeletal: He exhibits no edema.  Neurological: He is alert and oriented to person, place, and time.  Skin: Skin is warm and dry.  Psychiatric: He has a normal mood and affect.  Nursing note and vitals reviewed.   BP 108/68 (BP Location: Left Arm, Patient Position: Sitting, Cuff Size: Large)   Pulse 81   Temp 98.2 F (36.8 C) (Oral)   Ht 6' (1.829 m)   Wt 223 lb 2 oz (101.2 kg)   SpO2 98%   BMI 30.26 kg/m  Wt Readings from Last 3 Encounters:  03/11/16 223 lb 2 oz (101.2 kg)  10/16/15 228 lb (103.4 kg)  04/11/15 225 lb (102.1 kg)     Lab Results  Component Value Date   WBC 9.5 03/11/2016   HGB 13.3 03/11/2016   HCT 39.8 03/11/2016   PLT 251.0 03/11/2016   GLUCOSE 122 (H) 03/11/2016   CHOL 118 03/11/2016   TRIG 85.0 03/11/2016   HDL 41.50 03/11/2016   LDLCALC 60 03/11/2016   ALT 19 03/11/2016   AST 16 03/11/2016   NA 139 03/11/2016   K 4.2 03/11/2016   CL 104 03/11/2016   CREATININE 1.09 03/11/2016   BUN 28 (H) 03/11/2016   CO2 28 03/11/2016   TSH 2.54 03/11/2016   PSA 0.46 03/11/2016   INR 0.99 04/20/2013   HGBA1C 6.8 (H) 03/11/2016   MICROALBUR 0.7 03/11/2016    Lab Results  Component Value Date   TSH 2.54 03/11/2016   Lab Results  Component Value Date   WBC 9.5 03/11/2016   HGB 13.3 03/11/2016   HCT 39.8  03/11/2016   MCV 91.4 03/11/2016   PLT 251.0 03/11/2016   Lab Results  Component Value Date   NA 139 03/11/2016   K 4.2 03/11/2016   CO2 28 03/11/2016   GLUCOSE 122 (H) 03/11/2016   BUN 28 (H) 03/11/2016   CREATININE 1.09 03/11/2016   BILITOT 0.4 03/11/2016   ALKPHOS 61 03/11/2016   AST 16 03/11/2016   ALT 19 03/11/2016   PROT 6.3 03/11/2016   ALBUMIN 3.6 03/11/2016   CALCIUM 8.9 03/11/2016   GFR 69.85 03/11/2016   Lab Results  Component Value Date   CHOL 118 03/11/2016   Lab Results  Component Value Date   HDL 41.50 03/11/2016   Lab Results  Component Value Date   LDLCALC 60 03/11/2016   Lab Results  Component Value Date   TRIG 85.0 03/11/2016   Lab Results  Component Value Date   CHOLHDL 3 03/11/2016   Lab Results  Component Value Date   HGBA1C 6.8 (H) 03/11/2016       Assessment & Plan:   Problem List Items Addressed This Visit    CAD (coronary artery disease)   Relevant Orders   TSH (Completed)   Comprehensive metabolic panel (Completed)   Hypothyroidism    On Levothyroxine, continue to monitor      DM (diabetes mellitus) ([place]) - Primary    hgba1c acceptable, minimize simple carbs. Increase exercise as tolerated. Continue current meds      Relevant Orders   Hemoglobin A1c (Completed)   Urine Microalbumin w/creat. ratio (Completed)   Hyperlipidemia    Tolerating statin, encouraged heart healthy diet, avoid trans fats, minimize simple carbs and saturated fats. Increase exercise as tolerated      Relevant Orders   Lipid panel (Completed)   Lower back pain   Benign prostatic hyperplasia   Relevant Orders  PSA (Completed)   Anemia   Relevant Orders   CBC (Completed)   Preventative health care    Patient encouraged to maintain heart healthy diet, regular exercise, adequate sleep. Consider daily probiotics. Take medications as prescribed. Given and reviewed copy of ACP documents from [facility] Secretary of State and encouraged to complete and  return       Other Visit Diagnoses    Encounter for immunization       Relevant Orders   Flu vaccine HIGH DOSE PF (Completed)   Bilateral hearing loss, unspecified hearing loss type       Relevant Orders   Ambulatory referral to Audiology   Routine history and physical examination of adult          I am having Mr. Mehrer maintain his multivitamin with minerals, loratadine, aspirin EC, freestyle, ONETOUCH DELICA LANCETS 11X, latanoprost, zoster vaccine live (PF), fluconazole, levothyroxine, atenolol, tamsulosin, finasteride, ONE TOUCH ULTRA TEST, metFORMIN, atorvastatin, and cholecalciferol.  Meds ordered this encounter  Medications  . cholecalciferol (VITAMIN D) 1000 units tablet    Sig: Take 1,000 Units by mouth daily.     Penni Homans, MD

## 2016-03-15 ENCOUNTER — Other Ambulatory Visit: Payer: Self-pay | Admitting: Family Medicine

## 2016-04-01 ENCOUNTER — Other Ambulatory Visit: Payer: Self-pay | Admitting: Family Medicine

## 2016-04-14 ENCOUNTER — Ambulatory Visit: Payer: Medicare Other | Attending: Family Medicine | Admitting: Audiology

## 2016-04-14 DIAGNOSIS — H903 Sensorineural hearing loss, bilateral: Secondary | ICD-10-CM | POA: Diagnosis not present

## 2016-04-14 DIAGNOSIS — H93299 Other abnormal auditory perceptions, unspecified ear: Secondary | ICD-10-CM | POA: Diagnosis not present

## 2016-04-14 DIAGNOSIS — R94128 Abnormal results of other function studies of ear and other special senses: Secondary | ICD-10-CM | POA: Insufficient documentation

## 2016-04-14 DIAGNOSIS — Z01110 Encounter for hearing examination following failed hearing screening: Secondary | ICD-10-CM

## 2016-04-14 DIAGNOSIS — H9193 Unspecified hearing loss, bilateral: Secondary | ICD-10-CM | POA: Insufficient documentation

## 2016-04-14 DIAGNOSIS — Z01118 Encounter for examination of ears and hearing with other abnormal findings: Secondary | ICD-10-CM | POA: Insufficient documentation

## 2016-04-14 NOTE — Procedures (Signed)
Outpatient [facility]  [place], [place] [address]  [phone]   Audiological Evaluation  Patient Name: Harry Brown   Status: Outpatient   DOB: 03-12-1941    Diagnosis: Bilateral Hearing Loss              MRN: [id number] Date:  04/14/2016     Referent: Penni Homans, MD  History: [name] was seen for an audiological evaluation.  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  Primary Concern: Gradual hearing loss over the past 5 years. Wife has noticed.  History of hearing problems: Y / N History of ear infections:   N History of dizziness/vertigo:    N History of balance issues:  Y - occasionally, not severe.  Usually when first stand up, but not always with a "drunk feeling". 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 History of hypertension: Y - Controlled with medication. History of diabetes:  Y - takes metforma. Controlled Family history of hearing loss:  N Other concerns: If lays head on the pillow - clear fluid, no odor comes out of ear and will wet the pillow. Medications:    Evaluation: Conventional pure tone audiometry from 250Hz  - 8000Hz  with using insert earphones shows symmetrical hearing thresholds that range from 15-20 dBHL from 250Hz  - 500Hz ; 20-30 dBHL at 1000Hz ; 40 dBHL at 1500Hz ; 50-65 dBHL at 2000Hz  and 65-70 dBHL from 3000Hz  - 8000Hz .  The hearing loss is primarily sensorineural.   Reliability is good Speech reception levels (repeating words near threshold) using recorded spondee word lists:  Right ear: 30 dBHL.  Left ear:  35 dBHL Word recognition (at comfortably loud volumes) using recorded word lists at 70 dBHL with 60 dBHL contralateral masking, in quiet.  Right ear: 86%.  Left ear:   68% Word recognition in minimal background noise:  +5 dBHL  Right ear: 50%                              Left ear:  26%   Tympanometry (middle ear function) with present ipsilateral acoustic reflexes from 500Hz  - 4000Hz . Acoustic reflex decay is negative bilaterally. Note the left ear had a slightly larger volume.  Right ear: Normal (Type A) with 85-90dB acoustic reflexes.  Left ear: Normal (Type A) with 90dB from 500Hz  - 1000Hz ; 95 dB at 2000hz  and 1000dB at 4000Hz .   CONCLUSION:    [name] has a significant symmetrical hearing loss bilaterally that ranges from normal hearing in the low frequencies to a severe high frequency sensorineural hearing loss.  Even through the hearing thresholds are similar between the ears, his ability to understand words is not.  The right ear has good word recognition and the left ear has poor word recognition in quiet at loud conversational speech levels. In minimal background noise, word recognition drops to poor bilaterally- poorer on the left side.  As discussed with Gwyndolyn Saxon, a hearing aid evaluation is strongly recommended.  In addition, since [name] otoscopic inspection is well within normal limits, without redness, and he states that his physician has found no issues with his ear canal, using a cotton ball, placed lightly in his ear canal was suggested to help keep his pillow dry.  Currently he states that he holds his head up with his hand in an "awkward  position" while sleeping.  Of concern is that [name] may create secondary muscular issues related to him trying to keep his pillow "dry".  Another option would be referral to an ENT for further evaluation and recommendations.    For your information, amplification helps make the signal louder and therefore often improves hearing and word recognition.  Amplification has many forms including hearing aids in one or both ears, an assistive listening device which have a microphone and speaker such as a small handheld device and/or even a surround sound system of speakers.  Amplification may be covered by some  insurances, but not all.  It is important to note that hearing aids must be individually fit according to the hearing test results and the ear shape.  Audiologists and hearing aid dealers in [place] must be licensed in order to dispense hearing aids.  In addition, a trial period is mandated by law in our state because often amplification must be tried and then evaluated in order to determine benefit.  There are many excellent choices when it comes to amplification in our area and providers are listed in the phone book under hearing aids, there are audiologists in private practice, those affiliated with Ear, Nose and Throat physicians, and there are audiologists located at [place] in [facility] may help with obtaining one hearing aid or one captioned telephone if hearing loss and financial qualifications are met.  Please contact Robin Searing at [phone] .  As discussed with [name] [facility] participates with this program and he may wish to contact them (or Robin Searing) for further information about the program.  RECOMMENDATIONS: 1.   A hearing aid evaluation. 2.   Consider further evaluation of the clear ear drainage, without odor by an Ear, Nose and Throat physician, if this is problematic.  3.  Strategies that help improve hearing include: A) Face the speaker directly. Optimal is having the speakers face well - lit.  Unless amplified, being within 3-6 feet of the speaker will enhance word recognition. B) Avoid having the speaker back-lit as this will minimize the ability to use cues from lip-reading, facial expression and gestures. C)  Word recognition is poorer in background noise. For optimal word recognition, turn off the TV, radio or noisy fan when engaging in conversation. In a restaurant, try to sit away from noise sources and close to the primary speaker.  D)  Ask for topic clarification from time to time in order to  remain in the conversation.  Most people don't mind repeating or clarifying a point when asked.  If needed, explain the difficulty hearing in background noise or hearing loss. 4.   Use hearing protection during noisy activities such as using a weed eater, moving the lawn, shooting, etc.    Musician's plugs, are available from several places including [employer] for music related hearing protection because there is no distortion.  Other hearing protection, such as sponge plugs (available at pharmacies) or earmuffs (available at sporting goods stores or department stores such as Paediatric nurse) are useful for noisy activities and venues.  Dshaun Reppucci L. Heide Spark, Au.D., CCC-A Doctor of Audiology 04/14/2016   cc: Penni Homans, MD

## 2016-04-16 ENCOUNTER — Ambulatory Visit: Payer: Medicare Other | Admitting: Family

## 2016-04-16 ENCOUNTER — Other Ambulatory Visit (HOSPITAL_COMMUNITY): Payer: Medicare Other

## 2016-04-16 ENCOUNTER — Encounter (HOSPITAL_COMMUNITY): Payer: Medicare Other

## 2016-05-04 ENCOUNTER — Other Ambulatory Visit: Payer: Self-pay | Admitting: Family Medicine

## 2016-05-11 ENCOUNTER — Other Ambulatory Visit: Payer: Self-pay | Admitting: Family Medicine

## 2016-05-12 ENCOUNTER — Encounter: Payer: Self-pay | Admitting: Family

## 2016-05-21 ENCOUNTER — Encounter: Payer: Self-pay | Admitting: Family

## 2016-05-21 ENCOUNTER — Ambulatory Visit (INDEPENDENT_AMBULATORY_CARE_PROVIDER_SITE_OTHER): Payer: Medicare Other | Admitting: Family

## 2016-05-21 ENCOUNTER — Ambulatory Visit (HOSPITAL_COMMUNITY)
Admission: RE | Admit: 2016-05-21 | Discharge: 2016-05-21 | Disposition: A | Discharge status: Home / Self Care | Payer: Medicare Other | Source: Ambulatory Visit | Attending: Family | Admitting: Family

## 2016-05-21 ENCOUNTER — Other Ambulatory Visit: Payer: Self-pay | Admitting: Family

## 2016-05-21 ENCOUNTER — Ambulatory Visit (INDEPENDENT_AMBULATORY_CARE_PROVIDER_SITE_OTHER)
Admission: RE | Admit: 2016-05-21 | Discharge: 2016-05-21 | Disposition: A | Discharge status: Home / Self Care | Payer: Medicare Other | Source: Ambulatory Visit | Attending: Family | Admitting: Family

## 2016-05-21 VITALS — BP 127/64 | HR 63 | Temp 97.5°F | Resp 20 | Wt 221.0 lb

## 2016-05-21 DIAGNOSIS — I739 Peripheral vascular disease, unspecified: Secondary | ICD-10-CM | POA: Diagnosis not present

## 2016-05-21 DIAGNOSIS — Z48812 Encounter for surgical aftercare following surgery on the circulatory system: Secondary | ICD-10-CM

## 2016-05-21 DIAGNOSIS — Z87891 Personal history of nicotine dependence: Secondary | ICD-10-CM

## 2016-05-21 DIAGNOSIS — Z95828 Presence of other vascular implants and grafts: Secondary | ICD-10-CM

## 2016-05-21 DIAGNOSIS — E1151 Type 2 diabetes mellitus with diabetic peripheral angiopathy without gangrene: Secondary | ICD-10-CM

## 2016-05-21 DIAGNOSIS — I779 Disorder of arteries and arterioles, unspecified: Secondary | ICD-10-CM

## 2016-05-21 NOTE — Patient Instructions (Signed)

## 2016-05-21 NOTE — Progress Notes (Signed)
[facility] [place]   CC: Follow up peripheral artery occlusive disease  History of Present Illness Harry Brown is a 75 y.o. male patient of Dr. Bridgett Larsson who is s/p right femoropopliteal arterial bypass graft w/ NR ips GSV on 37/16/96; this was complicated with wound issues. He denies any claudication with walking, denies non healing wounds. He denies any history of stroke or TIA.  He reports beginning stages of glaucoma; is retired. He reports walking more since his last visit.   Dr. Stanford Breed is monitoring pt's small AAA, normal caliber external and common iliac arteries; last result on file is 03/03/15.  Pt Diabetic: Yes, 6.8 A1C October 2017 (review of records) Pt smoker: former smoker, quit in 2006  Pt meds include: Statin :Yes Betablocker: Yes ASA: Yes Other anticoagulants/antiplatelets: no    Past Medical History:  Diagnosis Date  . Anemia 09/24/2013  . CAD (coronary artery disease)   . Cellulitis 05/14/2013   RLE  . Diverticulitis   . Dizziness   . ED (erectile dysfunction)   . Glaucoma 08/18/2014  . History of sleep apnea    had surgery to correct  . Hypertension   . Hypothyroidism   . Medicare annual wellness visit, subsequent 04/13/2013  . Myocardial infarction 11/05/1985  . Neck pain on left side 11/22/2012  . Other and unspecified hyperlipidemia   . Penile lesion 02/24/2015  . Peripheral vascular disease ([place])   . Postoperative anemia due to acute blood loss 09/24/2013  . Preventative health care 03/13/2016  . Type II diabetes mellitus (HCC)    fasting 100-120  . Urinary urgency 11/22/2012    Social History Social History  Substance Use Topics  . Smoking status: Former Smoker    Packs/day: 1.50    Years: 50.00    Types: Cigarettes  . Smokeless tobacco: Never Used     Comment: 05/14/2013 "quit smoking in ~ 2004; still Uses Comit lozenges"  . Alcohol use No    Family History Family History  Problem Relation Age of Onset  .  Hyperlipidemia Mother   . Heart disease Mother   . Diabetes Mother   . Hypertension Mother   . Heart disease Father   . Asthma Father   . Arthritis Father   . Cancer Neg Hx   . Heart disease Sister   . Heart disease Sister     s/p bypass x 2  . Lupus Sister   . Heart disease Brother     MI    Past Surgical History:  Procedure Laterality Date  . ABDOMINAL AORTAGRAM  Oct. 9, 2014   Dr. Bridgett Larsson  . ABDOMINAL AORTAGRAM N/A 03/15/2013   Procedure: ABDOMINAL Maxcine Ham;  Surgeon: Conrad [place], MD;  Location: [facility] CATH LAB;  Service: Cardiovascular;  Laterality: N/A;  . ANTERIOR CERVICAL DECOMP/DISCECTOMY FUSION  ~ 2000  . CATARACT EXTRACTION Right   . CORONARY ARTERY BYPASS GRAFT  2001   "CABG X3" (05/14/2013)  . DENTAL SURGERY     "multiple teeth removed; trmimed top of mouth so dentures would fit" (05/14/2013)  . EYE SURGERY     cataracts  . FEMORAL-POPLITEAL BYPASS GRAFT Right 04/25/2013   Procedure: RIGHT FEMORAL TO ABOVE KNEE POPLITEAL BYPASS GRAFT WITH RIGHT GREATER SAPHENOUS VEIN HARVEST; ULTRASOUND GUIDED;  Surgeon: Conrad , MD;  Location: [facility] OR;  Service: Vascular;  Laterality: Right;  . GUM SURGERY  [phone]   "had bone scraped; had periodontal disease" (05/14/2013)  . PALATE / UVULA BIOPSY / EXCISION  2003   Removed due to sleep apnea  . TONSILLECTOMY      Allergies  Allergen Reactions  . Adhesive [Tape]   . Latex     rash  . Other Hives    Plastic shopping bags Metal staples: rash, swelling    Current Outpatient Prescriptions  Medication Sig Dispense Refill  . aspirin EC 81 MG tablet Take 81 mg by mouth at bedtime.    Marland Kitchen atenolol (TENORMIN) 25 MG tablet TAKE ONE (1) TABLET BY MOUTH EVERY DAY 30 tablet 0  . atorvastatin (LIPITOR) 40 MG tablet TAKE ONE (1) TABLET BY MOUTH EVERY DAY 30 tablet 5  . cholecalciferol (VITAMIN D) 1000 units tablet Take 1,000 Units by mouth daily.    . finasteride (PROSCAR) 5 MG tablet TAKE ONE (1) TABLET EACH DAY 30 tablet 1  .  fluconazole (DIFLUCAN) 150 MG tablet Take 1 tablet (150 mg total) by mouth daily. 4 tablet 0  . Lancets (FREESTYLE) lancets Use to check blood sugar once a day.  Dx. 250.00. 100 each 1  . latanoprost (XALATAN) 0.005 % ophthalmic solution Place into both eyes at bedtime.    Marland Kitchen levothyroxine (SYNTHROID, LEVOTHROID) 50 MCG tablet TAKE ONE TABLET BY MOUTH EVERY MORNING BEFORE BREAKFAST 30 tablet 1  . loratadine (ALAVERT) 10 MG tablet Take 10 mg by mouth daily as needed for allergies.    . metFORMIN (GLUCOPHAGE-XR) 500 MG 24 hr tablet TAKE TWO (2) TABLETS BY MOUTH DAILY WITHBREAKFAST 60 tablet 5  . Multiple Vitamin (MULTIVITAMIN WITH MINERALS) TABS tablet Take 1 tablet by mouth daily.    . ONE TOUCH ULTRA TEST test strip USE TO CHECK BLOOD SUGAR ONCE DAILY 100 each 6  . ONETOUCH DELICA LANCETS 66Y MISC     . tamsulosin (FLOMAX) 0.4 MG CAPS capsule TAKE ONE CAPSULE BY MOUTH DAILY 30 capsule 2  . zoster vaccine live, PF, (ZOSTAVAX) 40347 UNT/0.65ML injection Inject 19,400 Units into the skin once. 1 each 0   No current facility-administered medications for this visit.     ROS: See HPI for pertinent positives and negatives.   Physical Examination  Vitals:   05/21/16 1322 05/21/16 1326  BP: 138/75 127/64  Pulse: 61 63  Resp: 20   Temp: 97.5 F (36.4 C)   SpO2: 99%   Weight: 221 lb (100.2 kg)    Body mass index is 29.97 kg/m.  General: A&O x 3, WDWN, male.   Pulmonary: Sym exp, respirations are non labored, good air movt, CTAB, no rales, rhonchi, or wheezing  Cardiac: RRR, Nl S1, S2, no detected murmurs  Vascular: Vessel Right Left  Radial 1+Palpable 2+Palpable  Carotid Palpable, without bruit Palpable, without bruit  Aorta Not palpable N/A  Femoral Not Palpable 2+ Palpable  Popliteal Not palpable Not palpable  PT Not Palpable Not Palpable  DP not Palpable not palpable   Gastrointestinal: soft, NTND, -G/R, - HSM, - palpable  masses, - CVAT B  Musculoskeletal: M/S 5/5 throughout, extremities without ischemic changes, R vein harvest incision healed  Neurologic: Pain and light touch intact in extremities , Motor exam as listed above    ASSESSMENT: Harry Brown is a 75 y.o. male who is s/p right femoropopliteal arterial bypass graft w/ NR ips GSV on 04/25/13. He no longer has claudication symptoms, no signs of ischemia in his lower extremities.  He has been walking more since his last visit.  His atherosclerotic risk factors include currently controlled DM and former smoker.  DATA Today's right LE  arterial Duplex suggests a patent right femoral to popliteal artery bypass graft with no areas of diameter reduction.  All biphasic waveforms.  No significant change compared to the last exam on 04-11-15.  ABI's and TBI's have slightly improved bilaterally: right is 1.00 (was 0.90) with biphasic waveforms, TBI improved to 0.76 form 0.56. Left is 0.83 improved from 0.79, monophasic waveforms, TBI improved to 0.50 from 0.44.   PLAN:  Continued graduated walking program.  Based on the patient's vascular studies and examination, pt will return to clinic in 1 year with ABI's and right LE arterial duplex. I advised him to notify us if he develops concerns re the circulation in his legs.  I discussed in depth with the patient the nature of atherosclerosis, and emphasized the importance of maximal medical management including strict control of blood pressure, blood glucose, and lipid levels, obtaining regular exercise, and continued cessation of smoking.  The patient is aware that without maximal medical management the underlying atherosclerotic disease process will progress, limiting the benefit of any interventions.  The patient was given information about PAD including signs, symptoms, treatment, what symptoms should prompt the patient to seek immediate medical care, and risk reduction measures to  take.  Clemon Chambers, RN, MSN, FNP-C [facility] Phone: [phone]  Clinic MD: Bridgett Larsson  05/21/16 1:27 PM

## 2016-05-25 DIAGNOSIS — H401221 Low-tension glaucoma, left eye, mild stage: Secondary | ICD-10-CM | POA: Diagnosis not present

## 2016-05-25 DIAGNOSIS — H401211 Low-tension glaucoma, right eye, mild stage: Secondary | ICD-10-CM | POA: Diagnosis not present

## 2016-05-25 DIAGNOSIS — H524 Presbyopia: Secondary | ICD-10-CM | POA: Diagnosis not present

## 2016-05-25 NOTE — Addendum Note (Signed)
Addended by: Lianne Cure A on: 05/25/2016 11:55 AM   Modules accepted: Orders

## 2016-06-08 ENCOUNTER — Other Ambulatory Visit: Payer: Self-pay | Admitting: Family Medicine

## 2016-06-15 ENCOUNTER — Other Ambulatory Visit: Payer: Self-pay | Admitting: Family Medicine

## 2016-07-06 ENCOUNTER — Other Ambulatory Visit: Payer: Self-pay | Admitting: Family Medicine

## 2016-08-17 ENCOUNTER — Other Ambulatory Visit: Payer: Self-pay | Admitting: Family Medicine

## 2016-08-17 MED ORDER — TAMSULOSIN HCL 0.4 MG PO CAPS: 0.4000 mg | ORAL_CAPSULE | Freq: Every day | ORAL | 4 refills | Status: DC

## 2016-09-09 ENCOUNTER — Ambulatory Visit: Payer: Medicare Other | Admitting: Family Medicine

## 2016-09-10 ENCOUNTER — Other Ambulatory Visit: Payer: Self-pay

## 2016-09-10 MED ORDER — METFORMIN HCL ER 500 MG PO TB24: ORAL_TABLET | ORAL | 5 refills | Status: DC

## 2016-09-13 ENCOUNTER — Encounter: Payer: Self-pay | Admitting: Family Medicine

## 2016-09-13 ENCOUNTER — Ambulatory Visit (INDEPENDENT_AMBULATORY_CARE_PROVIDER_SITE_OTHER): Payer: Medicare Other | Admitting: Family Medicine

## 2016-09-13 VITALS — BP 122/62 | HR 69 | Temp 98.2°F | Wt 220.2 lb

## 2016-09-13 DIAGNOSIS — E785 Hyperlipidemia, unspecified: Secondary | ICD-10-CM | POA: Diagnosis not present

## 2016-09-13 DIAGNOSIS — I10 Essential (primary) hypertension: Secondary | ICD-10-CM

## 2016-09-13 DIAGNOSIS — E1122 Type 2 diabetes mellitus with diabetic chronic kidney disease: Secondary | ICD-10-CM

## 2016-09-13 DIAGNOSIS — Z0000 Encounter for general adult medical examination without abnormal findings: Secondary | ICD-10-CM

## 2016-09-13 DIAGNOSIS — E039 Hypothyroidism, unspecified: Secondary | ICD-10-CM | POA: Diagnosis not present

## 2016-09-13 LAB — CBC
HCT: 41.5 % (ref 39.0–52.0)
Hemoglobin: 13.5 g/dL (ref 13.0–17.0)
MCHC: 32.5 g/dL (ref 30.0–36.0)
MCV: 91.9 fl (ref 78.0–100.0)
Platelets: 245 10*3/uL (ref 150.0–400.0)
RBC: 4.52 Mil/uL (ref 4.22–5.81)
RDW: 13.8 % (ref 11.5–15.5)
WBC: 8.3 10*3/uL (ref 4.0–10.5)

## 2016-09-13 LAB — COMPREHENSIVE METABOLIC PANEL
ALT: 19 U/L (ref 0–53)
AST: 19 U/L (ref 0–37)
Albumin: 4 g/dL (ref 3.5–5.2)
Alkaline Phosphatase: 59 U/L (ref 39–117)
BUN: 31 mg/dL — ABNORMAL HIGH (ref 6–23)
CO2: 22 mEq/L (ref 19–32)
Calcium: 9.1 mg/dL (ref 8.4–10.5)
Chloride: 109 mEq/L (ref 96–112)
Creatinine, Ser: 0.9 mg/dL (ref 0.40–1.50)
GFR: 87.01 mL/min (ref >60.00)
Glucose, Bld: 122 mg/dL — ABNORMAL HIGH (ref 70–99)
Potassium: 4.4 mEq/L (ref 3.5–5.1)
Sodium: 140 mEq/L (ref 135–145)
Total Bilirubin: 0.4 mg/dL (ref 0.2–1.2)
Total Protein: 7.2 g/dL (ref 6.0–8.3)

## 2016-09-13 LAB — LIPID PANEL
Cholesterol: 124 mg/dL (ref 0–200)
HDL: 41.7 mg/dL (ref >39.00)
LDL Cholesterol: 73 mg/dL (ref 0–99)
NonHDL: 82.78
Total CHOL/HDL Ratio: 3
Triglycerides: 49 mg/dL (ref 0.0–149.0)
VLDL: 9.8 mg/dL (ref 0.0–40.0)

## 2016-09-13 LAB — TSH: TSH: 3.73 u[IU]/mL (ref 0.35–4.50)

## 2016-09-13 LAB — HEMOGLOBIN A1C: Hgb A1c MFr Bld: 7.2 % — ABNORMAL HIGH (ref 4.6–6.5)

## 2016-09-13 NOTE — Assessment & Plan Note (Signed)
hgba1c acceptable, minimize simple carbs. Increase exercise as tolerated. Continue current meds 

## 2016-09-13 NOTE — Assessment & Plan Note (Signed)
Encouraged heart healthy diet, increase exercise, avoid trans fats, consider a krill oil cap daily 

## 2016-09-13 NOTE — Patient Instructions (Addendum)
Hydrate with 64 oz of clear fluids and protein every 6 hours or so, report if symptoms worsen  Bring a copy of your advance directives to your next office visit.  Hypertension Hypertension, commonly called high blood pressure, is when the force of blood pumping through the arteries is too strong. The arteries are the blood vessels that carry blood from the heart throughout the body. Hypertension forces the heart to work harder to pump blood and may cause arteries to become narrow or stiff. Having untreated or uncontrolled hypertension can cause heart attacks, strokes, kidney disease, and other problems. A blood pressure reading consists of a higher number over a lower number. Ideally, your blood pressure should be below 120/80. The first ("top") number is called the systolic pressure. It is a measure of the pressure in your arteries as your heart beats. The second ("bottom") number is called the diastolic pressure. It is a measure of the pressure in your arteries as the heart relaxes. What are the causes? The cause of this condition is not known. What increases the risk? Some risk factors for high blood pressure are under your control. Others are not. Factors you can change   Smoking.  Having type 2 diabetes mellitus, high cholesterol, or both.  Not getting enough exercise or physical activity.  Being overweight.  Having too much fat, sugar, calories, or salt (sodium) in your diet.  Drinking too much alcohol. Factors that are difficult or impossible to change   Having chronic kidney disease.  Having a family history of high blood pressure.  Age. Risk increases with age.  Race. You may be at higher risk if you are African-American.  Gender. Men are at higher risk than women before age 66. After age 37, women are at higher risk than men.  Having obstructive sleep apnea.  Stress. What are the signs or symptoms? Extremely high blood pressure (hypertensive crisis) may  cause:  Headache.  Anxiety.  Shortness of breath.  Nosebleed.  Nausea and vomiting.  Severe chest pain.  Jerky movements you cannot control (seizures). How is this diagnosed? This condition is diagnosed by measuring your blood pressure while you are seated, with your arm resting on a surface. The cuff of the blood pressure monitor will be placed directly against the skin of your upper arm at the level of your heart. It should be measured at least twice using the same arm. Certain conditions can cause a difference in blood pressure between your right and left arms. Certain factors can cause blood pressure readings to be lower or higher than normal (elevated) for a short period of time:  When your blood pressure is higher when you are in a health care provider's office than when you are at home, this is called white coat hypertension. Most people with this condition do not need medicines.  When your blood pressure is higher at home than when you are in a health care provider's office, this is called masked hypertension. Most people with this condition may need medicines to control blood pressure. If you have a high blood pressure reading during one visit or you have normal blood pressure with other risk factors:  You may be asked to return on a different day to have your blood pressure checked again.  You may be asked to monitor your blood pressure at home for 1 week or longer. If you are diagnosed with hypertension, you may have other blood or imaging tests to help your health care provider understand your  overall risk for other conditions. How is this treated? This condition is treated by making healthy lifestyle changes, such as eating healthy foods, exercising more, and reducing your alcohol intake. Your health care provider may prescribe medicine if lifestyle changes are not enough to get your blood pressure under control, and if:  Your systolic blood pressure is above 130.  Your  diastolic blood pressure is above 80. Your personal target blood pressure may vary depending on your medical conditions, your age, and other factors. Follow these instructions at home: Eating and drinking   Eat a diet that is high in fiber and potassium, and low in sodium, added sugar, and fat. An example eating plan is called the DASH (Dietary Approaches to Stop Hypertension) diet. To eat this way:  Eat plenty of fresh fruits and vegetables. Try to fill half of your plate at each meal with fruits and vegetables.  Eat whole grains, such as whole wheat pasta, brown rice, or whole grain bread. Fill about one quarter of your plate with whole grains.  Eat or drink low-fat dairy products, such as skim milk or low-fat yogurt.  Avoid fatty cuts of meat, processed or cured meats, and poultry with skin. Fill about one quarter of your plate with lean proteins, such as fish, chicken without skin, beans, eggs, and tofu.  Avoid premade and processed foods. These tend to be higher in sodium, added sugar, and fat.  Reduce your daily sodium intake. Most people with hypertension should eat less than 1,500 mg of sodium a day.  Limit alcohol intake to no more than 1 drink a day for nonpregnant women and 2 drinks a day for men. One drink equals 12 oz of beer, 5 oz of wine, or 1 oz of hard liquor. Lifestyle   Work with your health care provider to maintain a healthy body weight or to lose weight. Ask what an ideal weight is for you.  Get at least 30 minutes of exercise that causes your heart to beat faster (aerobic exercise) most days of the week. Activities may include walking, swimming, or biking.  Include exercise to strengthen your muscles (resistance exercise), such as pilates or lifting weights, as part of your weekly exercise routine. Try to do these types of exercises for 30 minutes at least 3 days a week.  Do not use any products that contain nicotine or tobacco, such as cigarettes and e-cigarettes.  If you need help quitting, ask your health care provider.  Monitor your blood pressure at home as told by your health care provider.  Keep all follow-up visits as told by your health care provider. This is important. Medicines   Take over-the-counter and prescription medicines only as told by your health care provider. Follow directions carefully. Blood pressure medicines must be taken as prescribed.  Do not skip doses of blood pressure medicine. Doing this puts you at risk for problems and can make the medicine less effective.  Ask your health care provider about side effects or reactions to medicines that you should watch for. Contact a health care provider if:  You think you are having a reaction to a medicine you are taking.  You have headaches that keep coming back (recurring).  You feel dizzy.  You have swelling in your ankles.  You have trouble with your vision. Get help right away if:  You develop a severe headache or confusion.  You have unusual weakness or numbness.  You feel faint.  You have severe pain in your  chest or abdomen.  You vomit repeatedly.  You have trouble breathing. Summary  Hypertension is when the force of blood pumping through your arteries is too strong. If this condition is not controlled, it may put you at risk for serious complications.  Your personal target blood pressure may vary depending on your medical conditions, your age, and other factors. For most people, a normal blood pressure is less than 120/80.  Hypertension is treated with lifestyle changes, medicines, or a combination of both. Lifestyle changes include weight loss, eating a healthy, low-sodium diet, exercising more, and limiting alcohol. This information is not intended to replace advice given to you by your health care provider. Make sure you discuss any questions you have with your health care provider. Document Released: 05/24/2005 Document Revised: 04/21/2016 Document  Reviewed: 04/21/2016 Elsevier Interactive Patient Education  2017 [employer].

## 2016-09-13 NOTE — Assessment & Plan Note (Signed)
On Levothyroxine, continue to monitor 

## 2016-09-13 NOTE — Progress Notes (Signed)
Patient ID: Harry Brown, male   DOB: July 29, 1940, 76 y.o.   MRN: [id number]   Subjective:    Patient ID: Harry Brown, male    DOB: 1941-05-16, 76 y.o.   MRN:  [id number]   Review of Systems     Physical Exam   Assessment & Plan:

## 2016-09-13 NOTE — Progress Notes (Signed)
Pre visit review using our clinic review tool, if applicable. No additional management support is needed unless otherwise documented below in the visit note. 

## 2016-09-13 NOTE — Progress Notes (Signed)
Subjective:   Harry Brown is a 76 y.o. male who presents for Medicare Annual/Subsequent preventive examination.  Review of Systems:  No ROS.  Medicare Wellness Visit.  Cardiac Risk Factors include: advanced age (>8mn, >>47women);diabetes mellitus;dyslipidemia;obesity (BMI >30kg/m2);hypertension;male gender  Sleep patterns: no sleep issues and gets up about 1 times nightly to void.   Home Safety/Smoke Alarms: Feels safe in home. Smoke alarms in place.    Living environment; residence and Firearm Safety: Lives w/ wife and 866month old puppy. 1-story house/ trailer, firearms stored safely. Seat Belt Safety/Bike Helmet: Wears seat belt.   Counseling:   Eye Exam- Dr. DBing Plumeevery 4 months.  Dental- Dr. NDocia Barrierevery 6 months. Brushes gums twice daily.  Male:   CCS- No report on file.     PSA-  Lab Results  Component Value Date   PSA 0.46 03/11/2016   PSA 0.58 11/20/2012        Objective:    Vitals: BP 122/62 (BP Location: Left Arm, Patient Position: Sitting, Cuff Size: Normal)   Pulse 69   Temp 98.2 F (36.8 C) (Oral)   Wt 220 lb 3.2 oz (99.9 kg)   SpO2 99% Comment: RA  BMI 29.86 kg/m   Body mass index is 29.86 kg/m.  Tobacco History  Smoking Status  . Former Smoker  . Packs/day: 1.50  . Years: 50.00  . Types: Cigarettes  Smokeless Tobacco  . Never Used    Comment: 05/14/2013 "quit smoking in ~ 2004; still Uses Comit lozenges"     Counseling given: Not Answered   Past Medical History:  Diagnosis Date  . Anemia 09/24/2013  . CAD (coronary artery disease)   . Cellulitis 05/14/2013   RLE  . Diverticulitis   . Dizziness   . ED (erectile dysfunction)   . Glaucoma 08/18/2014  . History of sleep apnea    had surgery to correct  . Hypertension   . Hypothyroidism   . Medicare annual wellness visit, subsequent 04/13/2013  . Myocardial infarction 11/05/1985  . Neck pain on left side 11/22/2012  . Other and unspecified hyperlipidemia   . Penile lesion  02/24/2015  . Peripheral vascular disease ([place]   . Postoperative anemia due to acute blood loss 09/24/2013  . Preventative health care 03/13/2016  . Type II diabetes mellitus (HCC)    fasting 100-120  . Urinary urgency 11/22/2012   Past Surgical History:  Procedure Laterality Date  . ABDOMINAL AORTAGRAM  Oct. 9, 2014   Dr. CBridgett Larsson . ABDOMINAL AORTAGRAM N/A 03/15/2013   Procedure: ABDOMINAL AMaxcine Ham  Surgeon: BConrad Girard MD;  [facility];  Service: Cardiovascular;  Laterality: N/A;  . ANTERIOR CERVICAL DECOMP/DISCECTOMY FUSION  ~ 2000  . CATARACT EXTRACTION Right   . CORONARY ARTERY BYPASS GRAFT  2001   "CABG X3" (05/14/2013)  . DENTAL SURGERY     "multiple teeth removed; trmimed top of mouth so dentures would fit" (05/14/2013)  . EYE SURGERY     cataracts  . FEMORAL-POPLITEAL BYPASS GRAFT Right 04/25/2013   Procedure: RIGHT FEMORAL TO ABOVE KNEE POPLITEAL BYPASS GRAFT WITH RIGHT GREATER SAPHENOUS VEIN HARVEST; ULTRASOUND GUIDED;  Surgeon: BConrad Sheldon MD;  [facility];  Service: Vascular;  Laterality: Right;  . GUM SURGERY  [phone]  "had bone scraped; had periodontal disease" (05/14/2013)  . PALATE / UVULA BIOPSY / EXCISION  2003   Removed due to sleep apnea  . TONSILLECTOMY     Family History  Problem  Relation Age of Onset  . Hyperlipidemia Mother   . Heart disease Mother   . Diabetes Mother   . Hypertension Mother   . Heart disease Father   . Asthma Father   . Arthritis Father   . Heart disease Sister   . Heart disease Sister     s/p bypass x 2  . Lupus Sister   . Heart disease Brother     MI  . Cancer Neg Hx    History  Sexual Activity  . Sexual activity: Not Currently    Comment: lives with wife, retiring end of next week, no dietary restrictions    Outpatient Encounter Prescriptions as of 09/13/2016  Medication Sig  . aspirin EC 81 MG tablet Take 81 mg by mouth at bedtime.  Marland Kitchen atenolol (TENORMIN) 25 MG tablet TAKE ONE (1) TABLET BY MOUTH EVERY DAY    . atorvastatin (LIPITOR) 40 MG tablet TAKE ONE (1) TABLET BY MOUTH EVERY DAY  . cholecalciferol (VITAMIN D) 1000 units tablet Take 1,000 Units by mouth daily.  . finasteride (PROSCAR) 5 MG tablet TAKE ONE (1) TABLET BY MOUTH EVERY DAY  . fluconazole (DIFLUCAN) 150 MG tablet Take 1 tablet (150 mg total) by mouth daily.  . Lancets (FREESTYLE) lancets Use to check blood sugar once a day.  Dx. 250.00.  Marland Kitchen latanoprost (XALATAN) 0.005 % ophthalmic solution Place into both eyes at bedtime.  Marland Kitchen levothyroxine (SYNTHROID, LEVOTHROID) 50 MCG tablet TAKE ONE TABLET BY MOUTH EVERY MORNING BEFORE BREAKFAST  . loratadine (ALAVERT) 10 MG tablet Take 10 mg by mouth daily as needed for allergies.  . metFORMIN (GLUCOPHAGE-XR) 500 MG 24 hr tablet TAKE TWO (2) TABLETS BY MOUTH DAILY WITH BREAKFAST  . Multiple Vitamin (MULTIVITAMIN WITH MINERALS) TABS tablet Take 1 tablet by mouth daily.  . ONE TOUCH ULTRA TEST test strip USE TO CHECK BLOOD SUGAR ONCE DAILY  . ONETOUCH DELICA LANCETS 34V MISC   . tamsulosin (FLOMAX) 0.4 MG CAPS capsule Take 1 capsule (0.4 mg total) by mouth daily.  . [DISCONTINUED] metFORMIN (GLUCOPHAGE-XR) 500 MG 24 hr tablet TAKE TWO (2) TABLETS BY MOUTH DAILY WITHBREAKFAST  . [DISCONTINUED] zoster vaccine live, PF, (ZOSTAVAX) 42595 UNT/0.65ML injection Inject 19,400 Units into the skin once. (Patient not taking: Reported on 09/13/2016)   No facility-administered encounter medications on file as of 09/13/2016.     Activities of Daily Living In your present state of health, do you have any difficulty performing the following activities: 09/13/2016 03/11/2016  Hearing? N N  Vision? N N  Difficulty concentrating or making decisions? N N  Walking or climbing stairs? N Y  Dressing or bathing? N N  Doing errands, shopping? N N  Preparing Food and eating ? N N  Using the Toilet? - N  In the past six months, have you accidently leaked urine? - N  Do you have problems with loss of bowel control? - N   Managing your Medications? N N  Managing your Finances? N N  Housekeeping or managing your Housekeeping? N N  Some recent data might be hidden    Patient Care Team: [name], MD as PCP - General (Family Medicine) Lelon Perla, MD as Consulting Physician (Cardiology) Conrad [place], MD as Consulting Physician (Vascular Surgery) Calvert Cantor, MD as Consulting Physician (Ophthalmology) Malachy Chamber, DDS as Consulting Physician (Periodontics)   Assessment:    Physical assessment deferred to PCP.  Exercise Activities and Dietary recommendations Current Exercise Habits: Home exercise routine, Type of  exercise: walking  Diet (meal preparation, eat out, water intake, caffeinated beverages, dairy products, fruits and vegetables): on average, 3 meals per day. Regular diet. Describes diet as in between healthy  and unhealthy. Eats out frequently because he is out working. Usually eats lunch out.   Goals    . Healthy Lifestyle          Continue to eat heart healthy diet (full of fruits, vegetables, whole grains, lean protein, water--limit salt, fat, and sugar intake) and increase physical activity as tolerated. Continue doing brain stimulating activities (puzzles, reading, adult coloring books, staying active) to keep memory sharp.        Fall Risk Fall Risk  09/13/2016 03/11/2016 02/24/2015 01/29/2014  Falls in the past year? Yes No Yes No  Number falls in past yr: 1 - 1 -  Injury with Fall? No - No -  Follow up Falls prevention discussed - - -   Depression Screen PHQ 2/9 Scores 09/13/2016 03/11/2016 02/24/2015 01/29/2014  PHQ - 2 Score 0 0 0 0    Cognitive Function MMSE - Mini Mental State Exam 09/13/2016 03/11/2016  Orientation to time 5 5  Orientation to Place 5 5  Registration 3 3  Attention/ Calculation 4 5  Recall 2 3  Language- name 2 objects 2 2  Language- repeat 1 1  Language- follow 3 step command 3 3  Language- read & follow direction 1 1  Write a sentence 1 1   Copy design 1 1  Total score 28 30        Immunization History  Administered Date(s) Administered  . Influenza Split 03/31/2012  . Influenza, High Dose Seasonal PF 03/20/2013, 02/24/2015, 03/11/2016  . Influenza,inj,Quad PF,36+ Mos 01/29/2014  . Pneumococcal Conjugate-13 03/20/2013  . Pneumococcal Polysaccharide-23 02/24/2015  . Td 06/07/2009   Screening Tests Health Maintenance  Topic Date Due  . OPHTHALMOLOGY EXAM  03/27/2015  . HEMOGLOBIN A1C  09/09/2016  . INFLUENZA VACCINE  01/05/2017  . FOOT EXAM  03/11/2017  . URINE MICROALBUMIN  03/11/2017  . TETANUS/TDAP  06/08/2019  . PNA vac Low Risk Adult  Completed      Plan:   Follow-up w/ PCP as directed.  Bring a copy of your advance directives to your next office visit.  During the course of the visit the patient was educated and counseled about the following appropriate screening and preventive services:   Vaccines to include Pneumococcal, Influenza,  Td, HCV  Cardiovascular Disease  Colorectal cancer screening  Diabetes screening  Prostate Cancer Screening  Glaucoma screening  Nutrition counseling   Patient Instructions (the written plan) was given to the patient.    Dorrene German, RN  09/13/2016

## 2016-09-13 NOTE — Assessment & Plan Note (Addendum)
Well controlled, no changes to meds. Encouraged heart healthy diet such as the DASH diet and exercise as tolerated. Has occasional episodes of light headedness

## 2016-09-13 NOTE — Progress Notes (Signed)
Patient ID: Harry Brown, male   DOB: 05-21-1941, 76 y.o.   MRN: [id number]   Subjective:  I acted as a Education administrator for Penni Homans, [place], [place]   Patient ID: Harry Brown, male    DOB: 11/23/1940, 76 y.o.   MRN: [id number]  Chief Complaint  Patient presents with  . Diabetes    F/U Type II.  Marland Kitchen Medicare Wellness    w/ RN     Diabetes  He presents for his follow-up diabetic visit. He has type 2 diabetes mellitus. Hypoglycemia symptoms include dizziness. Pertinent negatives for hypoglycemia include no headaches. Pertinent negatives for diabetes include no blurred vision and no chest pain. Risk factors for coronary artery disease include male sex.    Patient is in today for a follow up on Diabetes. Patient has a Hx of Type II Diabetes, CAD, hypothyroidism. Patient states the his blood glucose reading this morning was 130. But, states that the average has been in the 140s. No acute concerns noted at this time. No recent febrile illness or hospitalizations. No polyuria or polydipsia. Is trying to minimize carbohydrates. Sugars are well controlled. Denies CP/palp/SOB/HA/congestion/fevers/GI or GU c/o. Taking meds as prescribed  Patient Care Team: [name], MD as PCP - General (Family Medicine) Conrad [place], MD as Consulting Physician (Vascular Surgery) Calvert Cantor, MD as Consulting Physician (Ophthalmology) Malachy Chamber, DDS as Consulting Physician (Periodontics)   Past Medical History:  Diagnosis Date  . Anemia 09/24/2013  . CAD (coronary artery disease)   . Cellulitis 05/14/2013   RLE  . Diverticulitis   . Dizziness   . ED (erectile dysfunction)   . Glaucoma 08/18/2014  . History of sleep apnea    had surgery to correct  . Hypertension   . Hypothyroidism   . Medicare annual wellness visit, subsequent 04/13/2013  . Myocardial infarction 11/05/1985  . Neck pain on left side 11/22/2012  . Other and unspecified hyperlipidemia   . Penile lesion 02/24/2015  . Peripheral  vascular disease ([place])   . Postoperative anemia due to acute blood loss 09/24/2013  . Preventative health care 03/13/2016  . Type II diabetes mellitus (HCC)    fasting 100-120  . Urinary urgency 11/22/2012    Past Surgical History:  Procedure Laterality Date  . ABDOMINAL AORTAGRAM  Oct. 9, 2014   Dr. Bridgett Larsson  . ABDOMINAL AORTAGRAM N/A 03/15/2013   Procedure: ABDOMINAL Maxcine Ham;  Surgeon: Conrad Stoddard, MD;  Location: [facility] CATH LAB;  Service: Cardiovascular;  Laterality: N/A;  . ANTERIOR CERVICAL DECOMP/DISCECTOMY FUSION  ~ 2000  . CATARACT EXTRACTION Right   . CORONARY ARTERY BYPASS GRAFT  2001   "CABG X3" (05/14/2013)  . DENTAL SURGERY     "multiple teeth removed; trmimed top of mouth so dentures would fit" (05/14/2013)  . EYE SURGERY     cataracts  . FEMORAL-POPLITEAL BYPASS GRAFT Right 04/25/2013   Procedure: RIGHT FEMORAL TO ABOVE KNEE POPLITEAL BYPASS GRAFT WITH RIGHT GREATER SAPHENOUS VEIN HARVEST; ULTRASOUND GUIDED;  Surgeon: Conrad Orovada, MD;  Location: [facility] OR;  Service: Vascular;  Laterality: Right;  . GUM SURGERY  [phone]   "had bone scraped; had periodontal disease" (05/14/2013)  . PALATE / UVULA BIOPSY / EXCISION  2003   Removed due to sleep apnea  . TONSILLECTOMY      Family History  Problem Relation Age of Onset  . Hyperlipidemia Mother   . Heart disease Mother   . Diabetes Mother   . Hypertension Mother   .  Heart disease Father   . Asthma Father   . Arthritis Father   . Heart disease Sister   . Heart disease Sister     s/p bypass x 2  . Lupus Sister   . Heart disease Brother     MI  . Cancer Neg Hx     Social History   Social History  . Marital status: Married    Spouse name: N/A  . Number of children: 8   . Years of education: N/A   Occupational History  .  [employer]   Social History Main Topics  . Smoking status: Former Smoker    Packs/day: 1.50    Years: 50.00    Types: Cigarettes  . Smokeless tobacco: Never Used     Comment:  05/14/2013 "quit smoking in ~ 2004; still Uses Comit lozenges"  . Alcohol use No  . Drug use: No  . Sexual activity: Not Currently     Comment: lives with wife, retiring end of next week, no dietary restrictions   Other Topics Concern  . Not on file   Social History Narrative  . No narrative on file    Outpatient Medications Prior to Visit  Medication Sig Dispense Refill  . aspirin EC 81 MG tablet Take 81 mg by mouth at bedtime.    Marland Kitchen atenolol (TENORMIN) 25 MG tablet TAKE ONE (1) TABLET BY MOUTH EVERY DAY 30 tablet 5  . atorvastatin (LIPITOR) 40 MG tablet TAKE ONE (1) TABLET BY MOUTH EVERY DAY 30 tablet 5  . cholecalciferol (VITAMIN D) 1000 units tablet Take 1,000 Units by mouth daily.    . finasteride (PROSCAR) 5 MG tablet TAKE ONE (1) TABLET BY MOUTH EVERY DAY 30 tablet 2  . fluconazole (DIFLUCAN) 150 MG tablet Take 1 tablet (150 mg total) by mouth daily. 4 tablet 0  . Lancets (FREESTYLE) lancets Use to check blood sugar once a day.  Dx. 250.00. 100 each 1  . latanoprost (XALATAN) 0.005 % ophthalmic solution Place into both eyes at bedtime.    Marland Kitchen levothyroxine (SYNTHROID, LEVOTHROID) 50 MCG tablet TAKE ONE TABLET BY MOUTH EVERY MORNING BEFORE BREAKFAST 30 tablet 2  . loratadine (ALAVERT) 10 MG tablet Take 10 mg by mouth daily as needed for allergies.    . metFORMIN (GLUCOPHAGE-XR) 500 MG 24 hr tablet TAKE TWO (2) TABLETS BY MOUTH DAILY WITH BREAKFAST 60 tablet 5  . Multiple Vitamin (MULTIVITAMIN WITH MINERALS) TABS tablet Take 1 tablet by mouth daily.    . ONE TOUCH ULTRA TEST test strip USE TO CHECK BLOOD SUGAR ONCE DAILY 100 each 6  . ONETOUCH DELICA LANCETS 92E MISC     . tamsulosin (FLOMAX) 0.4 MG CAPS capsule Take 1 capsule (0.4 mg total) by mouth daily. 30 capsule 4  . zoster vaccine live, PF, (ZOSTAVAX) 26834 UNT/0.65ML injection Inject 19,400 Units into the skin once. (Patient not taking: Reported on 09/13/2016) 1 each 0   No facility-administered medications prior to visit.      Allergies  Allergen Reactions  . Adhesive [Tape]   . Latex     rash  . Other Hives    Plastic shopping bags Metal staples: rash, swelling    Review of Systems  Constitutional: Negative for fever and malaise/fatigue.  HENT: Negative for congestion.   Eyes: Negative for blurred vision.  Respiratory: Negative for cough and shortness of breath.   Cardiovascular: Negative for chest pain, palpitations and leg swelling.  Gastrointestinal: Negative for vomiting.  Musculoskeletal: Negative  for back pain.  Skin: Negative for rash.  Neurological: Positive for dizziness. Negative for loss of consciousness and headaches.       Objective:    Physical Exam  Constitutional: He is oriented to person, place, and time. He appears well-developed and well-nourished. No distress.  HENT:  Head: Normocephalic and atraumatic.  Eyes: Conjunctivae are normal.  Neck: Normal range of motion. No thyromegaly present.  Cardiovascular: Normal rate and regular rhythm.   Pulmonary/Chest: Effort normal and breath sounds normal. He has no wheezes.  Abdominal: Soft. Bowel sounds are normal. There is no tenderness.  Musculoskeletal: He exhibits no edema or deformity.  Neurological: He is alert and oriented to person, place, and time.  Skin: Skin is warm and dry. He is not diaphoretic.  Psychiatric: He has a normal mood and affect.    BP 122/62 (BP Location: Left Arm, Patient Position: Sitting, Cuff Size: Normal)   Pulse 69   Temp 98.2 F (36.8 C) (Oral)   Wt 220 lb 3.2 oz (99.9 kg)   SpO2 99% Comment: RA  BMI 29.86 kg/m  Wt Readings from Last 3 Encounters:  09/13/16 220 lb 3.2 oz (99.9 kg)  05/21/16 221 lb (100.2 kg)  03/11/16 223 lb 2 oz (101.2 kg)   BP Readings from Last 3 Encounters:  09/13/16 122/62  05/21/16 127/64  03/11/16 108/68     Immunization History  Administered Date(s) Administered  . Influenza Split 03/31/2012  . Influenza, High Dose Seasonal PF 03/20/2013, 02/24/2015,  03/11/2016  . Influenza,inj,Quad PF,36+ Mos 01/29/2014  . Pneumococcal Conjugate-13 03/20/2013  . Pneumococcal Polysaccharide-23 02/24/2015  . Td 06/07/2009    Health Maintenance  Topic Date Due  . OPHTHALMOLOGY EXAM  03/27/2015  . HEMOGLOBIN A1C  09/09/2016  . INFLUENZA VACCINE  01/05/2017  . FOOT EXAM  03/11/2017  . URINE MICROALBUMIN  03/11/2017  . TETANUS/TDAP  06/08/2019  . PNA vac Low Risk Adult  Completed    Lab Results  Component Value Date   WBC 9.5 03/11/2016   HGB 13.3 03/11/2016   HCT 39.8 03/11/2016   PLT 251.0 03/11/2016   GLUCOSE 122 (H) 03/11/2016   CHOL 118 03/11/2016   TRIG 85.0 03/11/2016   HDL 41.50 03/11/2016   LDLCALC 60 03/11/2016   ALT 19 03/11/2016   AST 16 03/11/2016   NA 139 03/11/2016   K 4.2 03/11/2016   CL 104 03/11/2016   CREATININE 1.09 03/11/2016   BUN 28 (H) 03/11/2016   CO2 28 03/11/2016   TSH 2.54 03/11/2016   PSA 0.46 03/11/2016   INR 0.99 04/20/2013   HGBA1C 6.8 (H) 03/11/2016   MICROALBUR 0.7 03/11/2016    Lab Results  Component Value Date   TSH 2.54 03/11/2016   Lab Results  Component Value Date   WBC 9.5 03/11/2016   HGB 13.3 03/11/2016   HCT 39.8 03/11/2016   MCV 91.4 03/11/2016   PLT 251.0 03/11/2016   Lab Results  Component Value Date   NA 139 03/11/2016   K 4.2 03/11/2016   CO2 28 03/11/2016   GLUCOSE 122 (H) 03/11/2016   BUN 28 (H) 03/11/2016   CREATININE 1.09 03/11/2016   BILITOT 0.4 03/11/2016   ALKPHOS 61 03/11/2016   AST 16 03/11/2016   ALT 19 03/11/2016   PROT 6.3 03/11/2016   ALBUMIN 3.6 03/11/2016   CALCIUM 8.9 03/11/2016   GFR 69.85 03/11/2016   Lab Results  Component Value Date   CHOL 118 03/11/2016   Lab Results  Component  Value Date   HDL 41.50 03/11/2016   Lab Results  Component Value Date   LDLCALC 60 03/11/2016   Lab Results  Component Value Date   TRIG 85.0 03/11/2016   Lab Results  Component Value Date   CHOLHDL 3 03/11/2016   Lab Results  Component Value Date    HGBA1C 6.8 (H) 03/11/2016         Assessment & Plan:   Problem List Items Addressed This Visit    Hypothyroidism    On Levothyroxine, continue to monitor      DM (diabetes mellitus) ([facility])    hgba1c acceptable, minimize simple carbs. Increase exercise as tolerated. Continue current meds      Relevant Orders   Hemoglobin A1c   Hyperlipidemia    Encouraged heart healthy diet, increase exercise, avoid trans fats, consider a krill oil cap daily      Relevant Orders   Lipid panel   Hypertension    Well controlled, no changes to meds. Encouraged heart healthy diet such as the DASH diet and exercise as tolerated. Has occasional episodes of light headedness      Relevant Orders   CBC   Comprehensive metabolic panel   TSH    Other Visit Diagnoses    Encounter for Medicare annual wellness exam    -  Primary      I have discontinued Mr. Kotas's zoster vaccine live (PF). I am also having him maintain his multivitamin with minerals, loratadine, aspirin EC, freestyle, ONETOUCH DELICA LANCETS 99U, latanoprost, fluconazole, ONE TOUCH ULTRA TEST, atorvastatin, cholecalciferol, atenolol, finasteride, levothyroxine, tamsulosin, and metFORMIN.  No orders of the defined types were placed in this encounter.   CMA served as Education administrator during this visit. History, Physical and Plan performed by medical provider. Documentation and orders reviewed and attested to.  Penni Homans, MD

## 2016-09-14 ENCOUNTER — Other Ambulatory Visit: Payer: Self-pay | Admitting: Family Medicine

## 2016-09-14 MED ORDER — ATORVASTATIN CALCIUM 40 MG PO TABS: 40.0000 mg | ORAL_TABLET | Freq: Every day | ORAL | 11 refills | Status: DC

## 2016-09-28 DIAGNOSIS — H401221 Low-tension glaucoma, left eye, mild stage: Secondary | ICD-10-CM | POA: Diagnosis not present

## 2016-09-28 DIAGNOSIS — H401211 Low-tension glaucoma, right eye, mild stage: Secondary | ICD-10-CM | POA: Diagnosis not present

## 2016-10-08 ENCOUNTER — Other Ambulatory Visit: Payer: Self-pay | Admitting: Family Medicine

## 2016-11-02 ENCOUNTER — Other Ambulatory Visit: Payer: Self-pay | Admitting: Family Medicine

## 2016-11-16 ENCOUNTER — Other Ambulatory Visit: Payer: Self-pay | Admitting: Family Medicine

## 2016-12-31 ENCOUNTER — Other Ambulatory Visit: Payer: Self-pay | Admitting: Family Medicine

## 2017-01-12 ENCOUNTER — Other Ambulatory Visit: Payer: Self-pay | Admitting: Family Medicine

## 2017-01-12 NOTE — Telephone Encounter (Signed)
Rx sent to pharmacy. LB 

## 2017-02-01 DIAGNOSIS — H401211 Low-tension glaucoma, right eye, mild stage: Secondary | ICD-10-CM | POA: Diagnosis not present

## 2017-02-01 DIAGNOSIS — H401221 Low-tension glaucoma, left eye, mild stage: Secondary | ICD-10-CM | POA: Diagnosis not present

## 2017-02-05 ENCOUNTER — Other Ambulatory Visit: Payer: Self-pay | Admitting: Family Medicine

## 2017-02-11 ENCOUNTER — Other Ambulatory Visit: Payer: Self-pay | Admitting: Family Medicine

## 2017-03-01 ENCOUNTER — Other Ambulatory Visit: Payer: Self-pay | Admitting: Family Medicine

## 2017-03-15 ENCOUNTER — Ambulatory Visit (INDEPENDENT_AMBULATORY_CARE_PROVIDER_SITE_OTHER): Payer: Medicare Other | Admitting: Family Medicine

## 2017-03-15 ENCOUNTER — Encounter: Payer: Self-pay | Admitting: Family Medicine

## 2017-03-15 VITALS — BP 126/82 | HR 36 | Temp 97.7°F | Resp 18 | Ht 72.0 in | Wt 223.8 lb

## 2017-03-15 DIAGNOSIS — E1122 Type 2 diabetes mellitus with diabetic chronic kidney disease: Secondary | ICD-10-CM

## 2017-03-15 DIAGNOSIS — E039 Hypothyroidism, unspecified: Secondary | ICD-10-CM | POA: Diagnosis not present

## 2017-03-15 DIAGNOSIS — D649 Anemia, unspecified: Secondary | ICD-10-CM

## 2017-03-15 DIAGNOSIS — I714 Abdominal aortic aneurysm, without rupture, unspecified: Secondary | ICD-10-CM

## 2017-03-15 DIAGNOSIS — Z0000 Encounter for general adult medical examination without abnormal findings: Secondary | ICD-10-CM | POA: Diagnosis not present

## 2017-03-15 DIAGNOSIS — N400 Enlarged prostate without lower urinary tract symptoms: Secondary | ICD-10-CM

## 2017-03-15 DIAGNOSIS — E785 Hyperlipidemia, unspecified: Secondary | ICD-10-CM

## 2017-03-15 DIAGNOSIS — Z23 Encounter for immunization: Secondary | ICD-10-CM | POA: Diagnosis not present

## 2017-03-15 DIAGNOSIS — R351 Nocturia: Secondary | ICD-10-CM

## 2017-03-15 DIAGNOSIS — I10 Essential (primary) hypertension: Secondary | ICD-10-CM

## 2017-03-15 DIAGNOSIS — G6289 Other specified polyneuropathies: Secondary | ICD-10-CM | POA: Diagnosis not present

## 2017-03-15 DIAGNOSIS — I739 Peripheral vascular disease, unspecified: Secondary | ICD-10-CM

## 2017-03-15 HISTORY — DX: Nocturia: R35.1

## 2017-03-15 LAB — LIPID PANEL
Cholesterol: 105 mg/dL (ref 0–200)
HDL: 36.7 mg/dL — ABNORMAL LOW (ref >39.00)
LDL Cholesterol: 59 mg/dL (ref 0–99)
NonHDL: 68.74
Total CHOL/HDL Ratio: 3
Triglycerides: 47 mg/dL (ref 0.0–149.0)
VLDL: 9.4 mg/dL (ref 0.0–40.0)

## 2017-03-15 LAB — COMPREHENSIVE METABOLIC PANEL
ALT: 14 U/L (ref 0–53)
AST: 12 U/L (ref 0–37)
Albumin: 4 g/dL (ref 3.5–5.2)
Alkaline Phosphatase: 60 U/L (ref 39–117)
BUN: 23 mg/dL (ref 6–23)
CO2: 27 mEq/L (ref 19–32)
Calcium: 9.4 mg/dL (ref 8.4–10.5)
Chloride: 106 mEq/L (ref 96–112)
Creatinine, Ser: 0.96 mg/dL (ref 0.40–1.50)
GFR: 80.66 mL/min (ref >60.00)
Glucose, Bld: 127 mg/dL — ABNORMAL HIGH (ref 70–99)
Potassium: 5 mEq/L (ref 3.5–5.1)
Sodium: 139 mEq/L (ref 135–145)
Total Bilirubin: 0.6 mg/dL (ref 0.2–1.2)
Total Protein: 6.9 g/dL (ref 6.0–8.3)

## 2017-03-15 LAB — CBC
HCT: 38.4 % — ABNORMAL LOW (ref 39.0–52.0)
Hemoglobin: 12.3 g/dL — ABNORMAL LOW (ref 13.0–17.0)
MCHC: 32 g/dL (ref 30.0–36.0)
MCV: 93.2 fl (ref 78.0–100.0)
Platelets: 213 10*3/uL (ref 150.0–400.0)
RBC: 4.12 Mil/uL — ABNORMAL LOW (ref 4.22–5.81)
RDW: 14.4 % (ref 11.5–15.5)
WBC: 7.8 10*3/uL (ref 4.0–10.5)

## 2017-03-15 LAB — TSH: TSH: 4.26 u[IU]/mL (ref 0.35–4.50)

## 2017-03-15 LAB — HEMOGLOBIN A1C: Hgb A1c MFr Bld: 7.2 % — ABNORMAL HIGH (ref 4.6–6.5)

## 2017-03-15 MED ORDER — GLUCOSE BLOOD VI STRP: ORAL_STRIP | 5 refills | Status: DC

## 2017-03-15 NOTE — Assessment & Plan Note (Signed)
hgba1c acceptable, minimize simple carbs. Increase exercise as tolerated. Continue current meds 

## 2017-03-15 NOTE — Assessment & Plan Note (Signed)
Has been following with Dr Stanford Breed for ultrasound of AAA will refer back

## 2017-03-15 NOTE — Patient Instructions (Addendum)
64 oz of clear fluids, extra if you drink caffeine Small, frequent meals with lean proteins and only 1 carbohydrate per meal preferably complex (brown)  Stand up slowly, call if symptoms worsen Hyland's Leg cramp medicine or tonic water  Shingrix is the new shingles 2 shots over 2-6 months, can get at pharmacy, have send [place] a record of shot  Preventive Care 77 Years and Older, Male Preventive care refers to lifestyle choices and visits with your health care provider that can promote health and wellness. What does preventive care include?  A yearly physical exam. This is also called an annual well check.  Dental exams once or twice a year.  Routine eye exams. Ask your health care provider how often you should have your eyes checked.  Personal lifestyle choices, including: ? Daily care of your teeth and gums. ? Regular physical activity. ? Eating a healthy diet. ? Avoiding tobacco and drug use. ? Limiting alcohol use. ? Practicing safe sex. ? Taking low doses of aspirin every day. ? Taking vitamin and mineral supplements as recommended by your health care provider. What happens during an annual well check? The services and screenings done by your health care provider during your annual well check will depend on your age, overall health, lifestyle risk factors, and family history of disease. Counseling Your health care provider may ask you questions about your:  Alcohol use.  Tobacco use.  Drug use.  Emotional well-being.  Home and relationship well-being.  Sexual activity.  Eating habits.  History of falls.  Memory and ability to understand (cognition).  Work and work Statistician.  Screening You may have the following tests or measurements:  Height, weight, and BMI.  Blood pressure.  Lipid and cholesterol levels. These may be checked every 5 years, or more frequently if you are over 34 years old.  Skin check.  Lung cancer screening. You may have this  screening every year starting at age 77 if you have a 30-pack-year history of smoking and currently smoke or have quit within the past 15 years.  Fecal occult blood test (FOBT) of the stool. You may have this test every year starting at age 77.  Flexible sigmoidoscopy or colonoscopy. You may have a sigmoidoscopy every 5 years or a colonoscopy every 10 years starting at age 56.  Prostate cancer screening. Recommendations will vary depending on your family history and other risks.  Hepatitis C blood test.  Hepatitis B blood test.  Sexually transmitted disease (STD) testing.  Diabetes screening. This is done by checking your blood sugar (glucose) after you have not eaten for a while (fasting). You may have this done every 1-3 years.  Abdominal aortic aneurysm (AAA) screening. You may need this if you are a current or former smoker.  Osteoporosis. You may be screened starting at age 77 if you are at high risk.  Talk with your health care provider about your test results, treatment options, and if necessary, the need for more tests. Vaccines Your health care provider may recommend certain vaccines, such as:  Influenza vaccine. This is recommended every year.  Tetanus, diphtheria, and acellular pertussis (Tdap, Td) vaccine. You may need a Td booster every 10 years.  Varicella vaccine. You may need this if you have not been vaccinated.  Zoster vaccine. You may need this after age 77.  Measles, mumps, and rubella (MMR) vaccine. You may need at least one dose of MMR if you were born in 1957 or later. You may also need  a second dose.  Pneumococcal 13-valent conjugate (PCV13) vaccine. One dose is recommended after age 77.  Pneumococcal polysaccharide (PPSV23) vaccine. One dose is recommended after age 77.  Meningococcal vaccine. You may need this if you have certain conditions.  Hepatitis A vaccine. You may need this if you have certain conditions or if you travel or work in places where  you may be exposed to hepatitis A.  Hepatitis B vaccine. You may need this if you have certain conditions or if you travel or work in places where you may be exposed to hepatitis B.  Haemophilus influenzae type b (Hib) vaccine. You may need this if you have certain risk factors.  Talk to your health care provider about which screenings and vaccines you need and how often you need them. This information is not intended to replace advice given to you by your health care provider. Make sure you discuss any questions you have with your health care provider. Document Released: 06/20/2015 Document Revised: 02/11/2016 Document Reviewed: 03/25/2015 Elsevier Interactive Patient Education  2017 [employer].

## 2017-03-15 NOTE — Assessment & Plan Note (Signed)
On Levothyroxine, continue to monitor 

## 2017-03-15 NOTE — Assessment & Plan Note (Signed)
Stable and gets up roughly 2 x nightly. Is able to fall back to sleep. He is to report worsening symptoms but he is reassured that this is in the realm of normal.

## 2017-03-15 NOTE — Assessment & Plan Note (Signed)
Patient encouraged to maintain heart healthy diet, regular exercise, adequate sleep. Consider daily probiotics. Take medications as prescribed 

## 2017-03-15 NOTE — Assessment & Plan Note (Signed)
Describes his toes as numbness and cold but not debilitating or keeping him up. So for now no meds, will control risk factors

## 2017-03-15 NOTE — Progress Notes (Signed)
Subjective:  I acted as a Education administrator for Dr. Charlett Blake. [name], [place]  Patient ID: Harry Brown, male    DOB: 1940/08/18, 76 y.o.   MRN: [id number]  No chief complaint on file.   HPI  Patient is in today for an annual exam and follow up on chronic medical concerns Including diabetes, hypothyroidism, hyperlipidemia, peripheral vascular disease, hypertension and more. He feels well today but did lose his sister last month to complications of lupus. He reports he is handling it well. He reports occasional episodes of feeling dizzy or lightheaded upon arising quickly. It does not happen frequently and if he stands still for a few seconds it passes without incident. He's had no falls. No headaches or other neurologic complaints. He notes some trouble with decreased sensation in his toes. He has a burning and numb feeling at times. No falls or trauma. No significant pain. The symptoms do not keep him up at night. He does occasionally have trouble sleeping at night but it is not frequent and is not associated with other symptoms. He gets up roughly twice a night to urinate but no dysuria is associated. He reports his blood sugar this morning was 1:30. His range in the last weeks been 110-143 fasting. No polydipsia. 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 Is doing well with his activities of daily living otherwise. Denies CP/palp/SOB/HA/congestion/fevers/GI or GU c/o. Taking meds as prescribed  Patient Care Team: [name], MD as PCP - General (Family Medicine) Conrad Chickasha, MD as Consulting Physician (Vascular Surgery) Calvert Cantor, MD as Consulting Physician (Ophthalmology) Malachy Chamber, DDS as Consulting Physician (Periodontics)   Past Medical History:  Diagnosis Date  . Anemia 09/24/2013  . CAD (coronary artery disease)   . Cellulitis 05/14/2013   RLE    . Diverticulitis   . Dizziness   . ED (erectile dysfunction)   . Glaucoma 08/18/2014  . History of sleep apnea    had surgery to correct  . Hypertension   . Hypothyroidism   . Medicare annual wellness visit, subsequent 04/13/2013  . Myocardial infarction ([place]) 11/05/1985  . Neck pain on left side 11/22/2012  . Nocturia 03/15/2017  . Other and unspecified hyperlipidemia   . Penile lesion 02/24/2015  . Peripheral neuropathy 03/31/2012  . Peripheral vascular disease ([place])   . Postoperative anemia due to acute blood loss 09/24/2013  . Preventative health care 03/13/2016  . Type II diabetes mellitus (H[facility])    fasting 100-120  . Urinary urgency 11/22/2012    Past Surgical History:  Procedure Laterality Date  . ABDOMINAL AORTAGRAM  Oct. 9, 2014   Dr. Bridgett Larsson  . ABDOMINAL AORTAGRAM N/A 03/15/2013   Procedure: ABDOMINAL Maxcine Ham;  Surgeon: Conrad Hereford, MD;  Location: [facility] CATH LAB;  Service: Cardiovascular;  Laterality: N/A;  . ANTERIOR CERVICAL DECOMP/DISCECTOMY FUSION  ~ 2000  . CATARACT EXTRACTION Right   . CORONARY ARTERY BYPASS GRAFT  2001   "CABG X3" (05/14/2013)  . DENTAL SURGERY     "multiple teeth removed; trmimed top of mouth so dentures would fit" (05/14/2013)  . EYE SURGERY     cataracts  . FEMORAL-POPLITEAL BYPASS GRAFT Right 04/25/2013   Procedure: RIGHT FEMORAL TO ABOVE KNEE POPLITEAL BYPASS GRAFT WITH RIGHT GREATER SAPHENOUS VEIN HARVEST; ULTRASOUND GUIDED;  Surgeon: Conrad Gaylord, MD;  Location: [facility];  Service: Vascular;  Laterality: Right;  . GUM SURGERY  1990's   "had bone scraped; had periodontal disease" (05/14/2013)  . PALATE / UVULA BIOPSY / EXCISION  2003   Removed due to sleep apnea  . TONSILLECTOMY      Family History  Problem Relation Age of Onset  . Hyperlipidemia Mother   . Heart disease Mother   . Diabetes Mother   . Hypertension Mother   . Heart disease Father   . Asthma Father   . Arthritis Father   . Heart disease Sister   . Heart disease Sister         s/p bypass x 2  . Lupus Sister   . Heart disease Brother        MI waiting on heart transplant when died  . Heart disease Brother        massive MI  . Cancer Neg Hx     Social History   Social History  . Marital status: Married    Spouse name: N/A  . Number of children: 8   . Years of education: N/A   Occupational History  .  [employer]   Social History Main Topics  . Smoking status: Former Smoker    Packs/day: 1.50    Years: 50.00    Types: Cigarettes  . Smokeless tobacco: Never Used     Comment: 05/14/2013 "quit smoking in ~ 2004; still Uses Comit lozenges"  . Alcohol use No  . Drug use: No  . Sexual activity: Not Currently     Comment: lives with wife, retiring end of next week, no dietary restrictions   Other Topics Concern  . 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 aspirin EC 81 MG tablet Take 81 mg by mouth at bedtime.    Marland Kitchen atenolol (TENORMIN) 25 MG tablet TAKE ONE (1) TABLET BY MOUTH EVERY DAY 30 tablet 0  . atorvastatin (LIPITOR) 40 MG tablet Take 1 tablet (40 mg total) by mouth daily at 6 PM. 30 tablet 11  . cholecalciferol (VITAMIN D) 1000 units tablet Take 1,000 Units by mouth daily.    . finasteride (PROSCAR) 5 MG tablet TAKE ONE (1) TABLET BY MOUTH EACH DAY 30 tablet 0  . Lancets (FREESTYLE) lancets Use to check blood sugar once a day.  Dx. 250.00. 100 each 1  . latanoprost (XALATAN) 0.005 % ophthalmic solution Place into both eyes at bedtime.    Marland Kitchen levothyroxine (SYNTHROID, LEVOTHROID) 50 MCG tablet TAKE ONE TABLET EVERY MORNING BEFORE BREAKFAST 30 tablet 0  . loratadine (ALAVERT) 10 MG tablet Take 10 mg by mouth daily as needed for allergies.    . metFORMIN (GLUCOPHAGE-XR) 500 MG 24 hr tablet TAKE TWO TABLETS BY MOUTH DAILY WITH BREAKFAST 60 tablet 0  . Multiple Vitamin (MULTIVITAMIN WITH  MINERALS) TABS tablet Take 1 tablet by mouth daily.    Glory Rosebush DELICA LANCETS 44W MISC     . tamsulosin (FLOMAX) 0.4 MG CAPS capsule TAKE ONE (1) CAPSULE EACH DAY 30 capsule 2  . fluconazole (DIFLUCAN) 150 MG tablet Take 1 tablet (150 mg total) by mouth daily. 4 tablet 0  . ONE TOUCH ULTRA TEST test strip USE TO CHECK BLOOD SUGAR ONCE DAILY 100 each 1   No facility-administered medications prior to visit.     Allergies  Allergen Reactions  . Adhesive [Tape]   . Latex     rash  . Other Hives    Plastic shopping bags Metal staples: rash, swelling    Review of Systems  Constitutional: Negative for chills, fever and malaise/fatigue.  HENT: Negative for congestion and hearing loss.   Eyes: Negative for discharge.  Respiratory: Negative for cough, sputum production and shortness of breath.   Cardiovascular: Negative for chest pain, palpitations and leg swelling.  Gastrointestinal: Negative for abdominal pain, blood in stool, constipation, diarrhea, heartburn, nausea and vomiting.  Genitourinary: Positive for frequency. Negative for dysuria, hematuria and urgency.  Musculoskeletal: Negative for back pain, falls and myalgias.  Skin: Negative for rash.  Neurological: Positive for dizziness and sensory change. Negative for loss of consciousness, weakness and headaches.  Endo/Heme/Allergies: Negative for environmental allergies. Does not bruise/bleed easily.  Psychiatric/Behavioral: Negative for depression and suicidal ideas. The patient has insomnia. The patient is not nervous/anxious.        Objective:    Physical Exam  Constitutional: He is oriented to person, place, and time. He appears well-developed and well-nourished. No distress.  HENT:  Head: Normocephalic and atraumatic.  Eyes: Conjunctivae are normal.  Neck: Neck supple. No thyromegaly present.  Cardiovascular: Normal rate, regular rhythm and normal heart sounds.   No murmur heard. Pulmonary/Chest: Effort normal and  breath sounds normal. No respiratory distress. He has no wheezes.  Abdominal: Soft. Bowel sounds are normal. He exhibits no mass. There is no tenderness.  Musculoskeletal: He exhibits no edema.  Lymphadenopathy:    He has no cervical adenopathy.  Neurological: He is alert and oriented to person, place, and time.  Skin: Skin is warm and dry.  Psychiatric: He has a normal mood and affect. His behavior is normal.    BP 126/82 (BP Location: Left Arm, Patient Position: Sitting, Cuff Size: Normal)   Pulse (!) 36   Temp 97.7 F (36.5 C) (Oral)   Resp 18   Ht 6' (1.829 m)   Wt 223 lb 12.8 oz (101.5 kg)   SpO2 98%   BMI 30.35 kg/m  Wt Readings from Last 3 Encounters:  03/15/17 223 lb 12.8 oz (101.5 kg)  09/13/16 220 lb 3.2 oz (99.9 kg)  05/21/16 221 lb (100.2 kg)   BP Readings from Last 3 Encounters:  03/15/17 126/82  09/13/16 122/62  05/21/16 127/64     Immunization History  Administered Date(s) Administered  . Influenza Split 03/31/2012  . Influenza, High Dose Seasonal PF 03/20/2013, 02/24/2015, 03/11/2016  . Influenza,inj,Quad PF,6+ Mos 01/29/2014  . Pneumococcal Conjugate-13 03/20/2013  . Pneumococcal Polysaccharide-23 02/24/2015  . Td 06/07/2009    Health Maintenance  Topic Date Due  . OPHTHALMOLOGY EXAM  03/27/2015  . INFLUENZA VACCINE  01/05/2017  . FOOT EXAM  03/11/2017  . URINE MICROALBUMIN  03/11/2017  . HEMOGLOBIN A1C  03/15/2017  . TETANUS/TDAP  06/08/2019  . PNA vac Low Risk Adult  Completed    Lab Results  Component Value Date   WBC 8.3 09/13/2016   HGB 13.5 09/13/2016   HCT 41.5 09/13/2016   PLT 245.0 09/13/2016   GLUCOSE 122 (H) 09/13/2016   CHOL 124 09/13/2016   TRIG 49.0 09/13/2016   HDL 41.70 09/13/2016   LDLCALC 73 09/13/2016   ALT 19 09/13/2016   AST 19 09/13/2016   NA 140 09/13/2016   K 4.4 09/13/2016   CL 109 09/13/2016   CREATININE 0.90 09/13/2016   BUN 31 (H) 09/13/2016   CO2 22 09/13/2016  TSH 3.73 09/13/2016   PSA 0.46  03/11/2016   INR 0.99 04/20/2013   HGBA1C 7.2 (H) 09/13/2016   MICROALBUR 0.7 03/11/2016    Lab Results  Component Value Date   TSH 3.73 09/13/2016   Lab Results  Component Value Date   WBC 8.3 09/13/2016   HGB 13.5 09/13/2016   HCT 41.5 09/13/2016   MCV 91.9 09/13/2016   PLT 245.0 09/13/2016   Lab Results  Component Value Date   NA 140 09/13/2016   K 4.4 09/13/2016   CO2 22 09/13/2016   GLUCOSE 122 (H) 09/13/2016   BUN 31 (H) 09/13/2016   CREATININE 0.90 09/13/2016   BILITOT 0.4 09/13/2016   ALKPHOS 59 09/13/2016   AST 19 09/13/2016   ALT 19 09/13/2016   PROT 7.2 09/13/2016   ALBUMIN 4.0 09/13/2016   CALCIUM 9.1 09/13/2016   GFR 87.01 09/13/2016   Lab Results  Component Value Date   CHOL 124 09/13/2016   Lab Results  Component Value Date   HDL 41.70 09/13/2016   Lab Results  Component Value Date   LDLCALC 73 09/13/2016   Lab Results  Component Value Date   TRIG 49.0 09/13/2016   Lab Results  Component Value Date   CHOLHDL 3 09/13/2016   Lab Results  Component Value Date   HGBA1C 7.2 (H) 09/13/2016         Assessment & Plan:   Problem List Items Addressed This Visit    Hypothyroidism    On Levothyroxine, continue to monitor      DM (diabetes mellitus) ([place])    hgba1c acceptable, minimize simple carbs. Increase exercise as tolerated. Continue current meds      Relevant Orders   Ambulatory referral to Cardiology   Hemoglobin A1c   Hyperlipidemia    Tolerating statin, encouraged heart healthy diet, avoid trans fats, minimize simple carbs and saturated fats. Increase exercise as tolerated      Relevant Orders   Lipid panel   Peripheral neuropathy    Describes his toes as numbness and cold but not debilitating or keeping him up. So for now no meds, will control risk factors      PVD (peripheral vascular disease) ([place])    Follows with Dr Bridgett Larsson of vascular after stenting in right leg several years ago he has been stable and asymptomatic  has annual exam in December      Abdominal aortic aneurysm [facility])    Has been following with Dr Stanford Breed for ultrasound of AAA will refer back      Relevant Orders   Ambulatory referral to Cardiology   Benign prostatic hyperplasia    PSA in 2017 was 0.46 no need for further testing unless symptoms develop      Anemia    Recheck cbc      Preventative health care - Primary    Patient encouraged to maintain heart healthy diet, regular exercise, adequate sleep. Consider daily probiotics. Take medications as prescribed      Hypertension    Well controlled, no changes to meds. Encouraged heart healthy diet such as the DASH diet and exercise as tolerated. Describes being light headed after arising quickly occasionally. Encouraged not to arise so fast. Increase hydration and eat small, frequent meals with lean proteins and minimal carbs and report if symptoms worsen despite changes.       Relevant Orders   Ambulatory referral to Cardiology   Comprehensive metabolic panel   CBC   TSH   Nocturia  Stable and gets up roughly 2 x nightly. Is able to fall back to sleep. He is to report worsening symptoms but he is reassured that this is in the realm of normal.        Other Visit Diagnoses    Needs flu shot       Relevant Orders   Flu vaccine HIGH DOSE PF (Fluzone High dose)      I have discontinued Mr. Berrones's fluconazole. I have also changed his ONE TOUCH ULTRA TEST to glucose blood. Additionally, I am having him maintain his multivitamin with minerals, loratadine, aspirin EC, freestyle, ONETOUCH DELICA LANCETS 23T, latanoprost, cholecalciferol, atorvastatin, tamsulosin, metFORMIN, atenolol, levothyroxine, and finasteride.  Meds ordered this encounter  Medications  . glucose blood (ONE TOUCH ULTRA TEST) test strip    Sig: USE TO CHECK BLOOD SUGAR ONCE DAILY    Dispense:  100 each    Refill:  5    CMA served as scribe during this visit. History, Physical and Plan performed by  medical provider. Documentation and orders reviewed and attested to.  Penni Homans, MD

## 2017-03-15 NOTE — Assessment & Plan Note (Addendum)
Well controlled, no changes to meds. Encouraged heart healthy diet such as the DASH diet and exercise as tolerated. Describes being light headed after arising quickly occasionally. Encouraged not to arise so fast. Increase hydration and eat small, frequent meals with lean proteins and minimal carbs and report if symptoms worsen despite changes.

## 2017-03-15 NOTE — Assessment & Plan Note (Signed)
Recheck cbc.  

## 2017-03-15 NOTE — Assessment & Plan Note (Signed)
Follows with Dr Bridgett Larsson of vascular after stenting in right leg several years ago he has been stable and asymptomatic has annual exam in December

## 2017-03-15 NOTE — Assessment & Plan Note (Signed)
PSA in 2017 was 0.46 no need for further testing unless symptoms develop

## 2017-03-15 NOTE — Assessment & Plan Note (Signed)
Tolerating statin, encouraged heart healthy diet, avoid trans fats, minimize simple carbs and saturated fats. Increase exercise as tolerated 

## 2017-03-17 NOTE — Addendum Note (Signed)
Addended by: [name] A on: 03/17/2017 10:55 AM   Modules accepted: Orders

## 2017-03-22 ENCOUNTER — Other Ambulatory Visit: Payer: Self-pay | Admitting: Family Medicine

## 2017-03-26 ENCOUNTER — Other Ambulatory Visit: Payer: Self-pay | Admitting: Family Medicine

## 2017-03-30 ENCOUNTER — Other Ambulatory Visit (INDEPENDENT_AMBULATORY_CARE_PROVIDER_SITE_OTHER): Payer: Medicare Other

## 2017-03-30 DIAGNOSIS — D649 Anemia, unspecified: Secondary | ICD-10-CM | POA: Diagnosis not present

## 2017-03-30 LAB — FECAL OCCULT BLOOD, IMMUNOCHEMICAL: Fecal Occult Bld: NEGATIVE

## 2017-03-30 NOTE — Addendum Note (Signed)
Addended by: Harl Bowie on: 03/30/2017 04:37 PM   Modules accepted: Orders

## 2017-04-01 ENCOUNTER — Other Ambulatory Visit: Payer: Self-pay | Admitting: Family Medicine

## 2017-04-05 NOTE — Telephone Encounter (Signed)
°  Relation to ME:QAST Call back [place]:  Reason for call:  Spouse checking on the status of finasteride (PROSCAR) 5 MG tablet  medication refill, eletronic request sent on 04/01/17, please send Rx   [facility] [place] - [address] [phone] (Phone) [phone] (Fax)

## 2017-04-06 ENCOUNTER — Other Ambulatory Visit: Payer: Self-pay | Admitting: Family Medicine

## 2017-04-11 ENCOUNTER — Other Ambulatory Visit: Payer: Self-pay | Admitting: Family Medicine

## 2017-04-12 NOTE — Progress Notes (Signed)
Referring-Stacey Charlett Blake, MD Reason for referral-CAD, AAA  HPI: 76 yo male for evaluation of AAA and coronary artery disease at request of Penni Homans MD. Seen previously but not since 04/03/13. The patient is status post cardiac catheterization in July of 2001. His ejection fraction was 53%. The patient had three-vessel coronary disease and underwent coronary artery bypassing graft in July of 2001 with a LIMA to the LAD, RIMA to the circumflex and saphenous vein graft to the PDA. Nuclear study in August of 2013 showed an ejection fraction of 57% and normal perfusion. Abdominal ultrasound September 2016 showed abdominal aortic aneurysm measuring 2.8 x 3 cm. Patient has known peripheral vascular disease followed by vascular surgery. Patient denies dyspnea, chest pain, palpitations, syncope or claudication.  Current Outpatient Medications  Medication Sig Dispense Refill  . aspirin EC 81 MG tablet Take 81 mg by mouth at bedtime.    Marland Kitchen atenolol (TENORMIN) 25 MG tablet TAKE ONE (1) TABLET BY MOUTH EACH DAY 30 tablet 1  . atorvastatin (LIPITOR) 40 MG tablet Take 1 tablet (40 mg total) by mouth daily at 6 PM. 30 tablet 11  . cholecalciferol (VITAMIN D) 1000 units tablet Take 1,000 Units by mouth daily.    . finasteride (PROSCAR) 5 MG tablet TAKE ONE (1) TABLET BY MOUTH EVERY DAY 30 tablet 1  . glucose blood (ONE TOUCH ULTRA TEST) test strip USE TO CHECK BLOOD SUGAR ONCE DAILY 100 each 5  . latanoprost (XALATAN) 0.005 % ophthalmic solution Place into both eyes at bedtime.    Marland Kitchen levothyroxine (SYNTHROID, LEVOTHROID) 50 MCG tablet TAKE ONE TABLET BY MOUTH EVERY MORNING BEFORE BREAKFAST 30 tablet 0  . loratadine (ALAVERT) 10 MG tablet Take 10 mg by mouth daily as needed for allergies.    . metFORMIN (GLUCOPHAGE-XR) 500 MG 24 hr tablet TAKE TWO TABLETS BY MOUTH DAILY WITH BREAKFAST 60 tablet 0  . Multiple Vitamin (MULTIVITAMIN WITH MINERALS) TABS tablet Take 1 tablet by mouth daily.    Glory Rosebush DELICA  LANCETS 60F MISC     . ONETOUCH DELICA LANCETS 09N MISC USE TO CHECK BLOOD SUGAR ONCE DAILY 100 each 1  . tamsulosin (FLOMAX) 0.4 MG CAPS capsule TAKE ONE (1) CAPSULE EACH DAY BY MOUTH 30 capsule 2   No current facility-administered medications for this visit.     Allergies  Allergen Reactions  . Adhesive [Tape]   . Latex     rash  . Other Hives    Plastic shopping bags Metal staples: rash, swelling     Past Medical History:  Diagnosis Date  . Anemia 09/24/2013  . CAD (coronary artery disease)   . Cellulitis 05/14/2013   RLE  . Diverticulitis   . ED (erectile dysfunction)   . Glaucoma 08/18/2014  . History of sleep apnea    had surgery to correct  . Hypertension   . Hypothyroidism   . Medicare annual wellness visit, subsequent 04/13/2013  . Myocardial infarction ([place]) 11/05/1985  . Nocturia 03/15/2017  . Other and unspecified hyperlipidemia   . Penile lesion 02/24/2015  . Peripheral neuropathy 03/31/2012  . Peripheral vascular disease ([place])   . Postoperative anemia due to acute blood loss 09/24/2013  . Preventative health care 03/13/2016  . Type II diabetes mellitus ([facility])    fasting 100-120  . Urinary urgency 11/22/2012    Past Surgical History:  Procedure Laterality Date  . ABDOMINAL AORTAGRAM  Oct. 9, 2014   Dr. Bridgett Larsson  . ANTERIOR CERVICAL DECOMP/DISCECTOMY FUSION  ~  2000  . CATARACT EXTRACTION Right   . CORONARY ARTERY BYPASS GRAFT  2001   "CABG X3" (05/14/2013)  . DENTAL SURGERY     "multiple teeth removed; trmimed top of mouth so dentures would fit" (05/14/2013)  . EYE SURGERY     cataracts  . GUM SURGERY  1990's   "had bone scraped; had periodontal disease" (05/14/2013)  . PALATE / UVULA BIOPSY / EXCISION  2003   Removed due to sleep apnea  . TONSILLECTOMY      Social History   Socioeconomic History  . Marital status: Married    Spouse name: Not on file  . Number of children: 8   . Years of education: Not on file  . Highest education level: Not on file    Social Needs  . Financial resource strain: Not on file  . Food insecurity - worry: Not on file  . Food insecurity - inability: Not on file  . Transportation needs - medical: Not on file  . Transportation needs - non-medical: Not on file  Occupational History    Employer: [employer]  Tobacco Use  . Smoking status: Former Smoker    Packs/day: 1.50    Years: 50.00    Pack years: 75.00    Types: Cigarettes  . Smokeless tobacco: Never Used  . Tobacco comment: 05/14/2013 "quit smoking in ~ 2004; still Uses Comit lozenges"  Substance and Sexual Activity  . Alcohol use: No    Alcohol/week: 0.0 oz  . Drug use: No  . Sexual activity: Not Currently    Comment: lives with wife, retiring end of next week, no dietary restrictions  Other Topics Concern  . 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 Hyperlipidemia Mother   . Heart disease Mother   . Diabetes Mother   . Hypertension Mother   . Heart disease Father   . Asthma Father   . Arthritis Father   . Heart disease Sister   . Heart disease Sister        s/p bypass x 2  . Lupus Sister   . Heart disease Brother        MI waiting on heart transplant when died  . Heart disease Brother        massive MI  . Cancer Neg Hx     ROS: no fevers or chills, productive cough, hemoptysis, dysphasia, odynophagia, melena, hematochezia, dysuria, hematuria, rash, seizure activity, orthopnea, PND, pedal edema, claudication. Remaining systems are negative.  Physical Exam:   Blood pressure (!) 163/94, pulse 74, height 6' (1.829 m), weight 222 lb 12.8 oz (101.1 kg).  General:  Well developed/well nourished in NAD Skin warm/dry Patient not depressed No peripheral clubbing Back-normal HEENT-normal/normal eyelids Neck supple/normal carotid upstroke bilaterally; no bruits; no JVD; no thyromegaly chest - CTA/  normal expansion CV - RRR/normal S1 and S2; no murmurs, rubs or gallops;  PMI nondisplaced Abdomen -NT/ND, no HSM, no mass, + bowel sounds, no bruit 2+ femoral pulses, no bruits Ext-no edema, chords, 2+ DP Neuro-grossly nonfocal  ECG - sinus rhythm with ventricular bigeminy, normal axis, no ST changes.personally reviewed  A/P  1 Coronary artery disease status post coronary artery bypassing graft-patient is doing well with no chest pain. Continue aspirinand statin.  2 hyperlipidemia-continue statin. Laboratories from October 2018 personally reviewed. Total cholesterol 105 and LDL 59.  Liver functions normal.  3 abdominal aortic aneurysm-schedule follow-up ultrasound.  4 hypertension-blood pressure is elevated. However he follows this closely at home and it is typically controlled. Continue present medications and follow. Increase atenolol as needed.  Kirk Ruths, MD

## 2017-04-13 ENCOUNTER — Other Ambulatory Visit: Payer: Self-pay | Admitting: Family Medicine

## 2017-04-18 ENCOUNTER — Other Ambulatory Visit: Payer: Medicare Other

## 2017-04-18 ENCOUNTER — Other Ambulatory Visit: Payer: Self-pay | Admitting: Emergency Medicine

## 2017-04-18 DIAGNOSIS — D649 Anemia, unspecified: Secondary | ICD-10-CM

## 2017-04-18 NOTE — Addendum Note (Signed)
Addended by: Caffie Pinto on: 04/18/2017 07:28 AM   Modules accepted: Orders

## 2017-04-19 ENCOUNTER — Other Ambulatory Visit (INDEPENDENT_AMBULATORY_CARE_PROVIDER_SITE_OTHER): Payer: Medicare Other

## 2017-04-19 DIAGNOSIS — D649 Anemia, unspecified: Secondary | ICD-10-CM

## 2017-04-19 LAB — CBC
HCT: 38.7 % — ABNORMAL LOW (ref 39.0–52.0)
Hemoglobin: 12.5 g/dL — ABNORMAL LOW (ref 13.0–17.0)
MCHC: 32.2 g/dL (ref 30.0–36.0)
MCV: 92.5 fl (ref 78.0–100.0)
Platelets: 234 10*3/uL (ref 150.0–400.0)
RBC: 4.19 Mil/uL — ABNORMAL LOW (ref 4.22–5.81)
RDW: 14.5 % (ref 11.5–15.5)
WBC: 8.7 10*3/uL (ref 4.0–10.5)

## 2017-04-19 LAB — RETICULOCYTES
ABS Retic: 67200 cells/uL (ref 25000–9000)
Retic Ct Pct: 1.6 %

## 2017-04-20 ENCOUNTER — Encounter: Payer: Self-pay | Admitting: Cardiology

## 2017-04-20 ENCOUNTER — Ambulatory Visit: Payer: Medicare Other | Admitting: Cardiology

## 2017-04-20 VITALS — BP 163/94 | HR 74 | Ht 72.0 in | Wt 222.8 lb

## 2017-04-20 DIAGNOSIS — I714 Abdominal aortic aneurysm, without rupture, unspecified: Secondary | ICD-10-CM

## 2017-04-20 DIAGNOSIS — E7800 Pure hypercholesterolemia, unspecified: Secondary | ICD-10-CM | POA: Diagnosis not present

## 2017-04-20 DIAGNOSIS — I2510 Atherosclerotic heart disease of native coronary artery without angina pectoris: Secondary | ICD-10-CM

## 2017-04-20 DIAGNOSIS — I10 Essential (primary) hypertension: Secondary | ICD-10-CM | POA: Diagnosis not present

## 2017-04-20 NOTE — Patient Instructions (Signed)
Medication Instructions:   NO CHANGE  Testing/Procedures:  Your physician has requested that you have an abdominal aorta duplex. During this test, an ultrasound is used to evaluate the aorta. Allow 30 minutes for this exam. Do not eat after midnight the day before and avoid carbonated beverages   Follow-Up:  Your physician wants you to follow-up in: Harry Brown will receive a reminder letter in the mail two months in advance. If you don't receive a letter, please call our office to schedule the follow-up appointment.   If you need a refill on your cardiac medications before your next appointment, please call your pharmacy.

## 2017-04-26 ENCOUNTER — Ambulatory Visit (HOSPITAL_BASED_OUTPATIENT_CLINIC_OR_DEPARTMENT_OTHER)
Admission: RE | Admit: 2017-04-26 | Discharge: 2017-04-26 | Disposition: A | Discharge status: Home / Self Care | Payer: Medicare Other | Source: Ambulatory Visit | Attending: Cardiology | Admitting: Cardiology

## 2017-04-26 DIAGNOSIS — I714 Abdominal aortic aneurysm, without rupture, unspecified: Secondary | ICD-10-CM

## 2017-05-04 ENCOUNTER — Other Ambulatory Visit: Payer: Self-pay | Admitting: Family Medicine

## 2017-05-10 ENCOUNTER — Other Ambulatory Visit: Payer: Self-pay | Admitting: Family Medicine

## 2017-05-20 ENCOUNTER — Other Ambulatory Visit: Payer: Self-pay | Admitting: Family Medicine

## 2017-05-27 ENCOUNTER — Ambulatory Visit (HOSPITAL_COMMUNITY)
Admission: RE | Admit: 2017-05-27 | Discharge: 2017-05-27 | Disposition: A | Discharge status: Home / Self Care | Payer: Medicare Other | Source: Ambulatory Visit | Attending: Family | Admitting: Family

## 2017-05-27 ENCOUNTER — Ambulatory Visit (INDEPENDENT_AMBULATORY_CARE_PROVIDER_SITE_OTHER)
Admission: RE | Admit: 2017-05-27 | Discharge: 2017-05-27 | Disposition: A | Discharge status: Home / Self Care | Payer: Medicare Other | Source: Ambulatory Visit | Attending: Family | Admitting: Family

## 2017-05-27 ENCOUNTER — Ambulatory Visit: Payer: Medicare Other | Admitting: Family

## 2017-05-27 ENCOUNTER — Encounter: Payer: Self-pay | Admitting: Family

## 2017-05-27 VITALS — BP 130/80 | HR 45 | Temp 98.4°F | Resp 18 | Wt 218.0 lb

## 2017-05-27 DIAGNOSIS — Z87891 Personal history of nicotine dependence: Secondary | ICD-10-CM | POA: Insufficient documentation

## 2017-05-27 DIAGNOSIS — Z95828 Presence of other vascular implants and grafts: Secondary | ICD-10-CM

## 2017-05-27 DIAGNOSIS — E1151 Type 2 diabetes mellitus with diabetic peripheral angiopathy without gangrene: Secondary | ICD-10-CM | POA: Diagnosis not present

## 2017-05-27 DIAGNOSIS — I779 Disorder of arteries and arterioles, unspecified: Secondary | ICD-10-CM

## 2017-05-27 DIAGNOSIS — I10 Essential (primary) hypertension: Secondary | ICD-10-CM | POA: Insufficient documentation

## 2017-05-27 NOTE — Patient Instructions (Signed)

## 2017-05-27 NOTE — Progress Notes (Signed)
[facility] [place]   CC: Follow up peripheral artery occlusive disease  History of Present Illness MONTERRIO Brown is a 76 y.o. male who is s/p right femoropopliteal arterial bypass graft w/ NR ips GSV on 04/25/13 by Dr. Bridgett Larsson; this was complicated with wound issues, wounds have healed. He denies any claudication with walking, denies non healing wounds. He denies any history of stroke or TIA.  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  He reports walking less since his last visit.   Dr. Stanford Breed is monitoring pt's small AAA, normal caliber external and common iliac arteries; last result on file is 03/03/15. Pt states this was last checked in December 2018 at a different facility.  Pt Diabetic: Yes, 7.2 A1C October 2018 (review of records) Pt smoker: former smoker, quit in 2006  Pt meds include: Statin :Yes Betablocker: Yes ASA: Yes Other anticoagulants/antiplatelets: no    Past Medical History:  Diagnosis Date  . Anemia 09/24/2013  . CAD (coronary artery disease)   . Cellulitis 05/14/2013   RLE  . Diverticulitis   . ED (erectile dysfunction)   . Glaucoma 08/18/2014  . History of sleep apnea    had surgery to correct  . Hypertension   . Hypothyroidism   . Medicare annual wellness visit, subsequent 04/13/2013  . Myocardial infarction ([place]) 11/05/1985  . Nocturia 03/15/2017  . Other and unspecified hyperlipidemia   . Penile lesion 02/24/2015  . Peripheral neuropathy 03/31/2012  . Peripheral vascular disease ([place])   . Postoperative anemia due to acute blood loss 09/24/2013  . Preventative health care 03/13/2016  . Type II diabetes mellitus (HCC)    fasting 100-120  . Urinary urgency 11/22/2012    Social History Social History   Tobacco Use  . Smoking status: Former Smoker    Packs/day: 1.50    Years: 50.00    Pack years: 75.00    Types: Cigarettes  . Smokeless tobacco: Never Used  .  Tobacco comment: 05/14/2013 "quit smoking in ~ 2004; still Uses Comit lozenges"  Substance Use Topics  . Alcohol use: No    Alcohol/week: 0.0 oz  . Drug use: No    Family History Family History  Problem Relation Age of Onset  . Hyperlipidemia Mother   . Heart disease Mother   . Diabetes Mother   . Hypertension Mother   . Heart disease Father   . Asthma Father   . Arthritis Father   . Heart disease Sister   . Heart disease Sister        s/p bypass x 2  . Lupus Sister   . Heart disease Brother        MI waiting on heart transplant when died  . Heart disease Brother        massive MI  . Cancer Neg Hx     Past Surgical History:  Procedure Laterality Date  . ABDOMINAL AORTAGRAM  Oct. 9, 2014   Dr. Bridgett Larsson  . ABDOMINAL AORTAGRAM N/A 03/15/2013   Procedure: ABDOMINAL Maxcine Ham;  Surgeon: Conrad Willow Park, MD;  Location: [facility];  Service: Cardiovascular;  Laterality: N/A;  . ANTERIOR CERVICAL DECOMP/DISCECTOMY FUSION  ~ 2000  . CATARACT EXTRACTION Right   . CORONARY ARTERY BYPASS GRAFT  2001   "CABG X3" (05/14/2013)  . DENTAL SURGERY     "multiple teeth removed; trmimed top of mouth so dentures would fit" (05/14/2013)  . EYE SURGERY  cataracts  . FEMORAL-POPLITEAL BYPASS GRAFT Right 04/25/2013   Procedure: RIGHT FEMORAL TO ABOVE KNEE POPLITEAL BYPASS GRAFT WITH RIGHT GREATER SAPHENOUS VEIN HARVEST; ULTRASOUND GUIDED;  Surgeon: Conrad Kingsley, MD;  Location: [facility];  Service: Vascular;  Laterality: Right;  . GUM SURGERY  [phone]   "had bone scraped; had periodontal disease" (05/14/2013)  . PALATE / UVULA BIOPSY / EXCISION  2003   Removed due to sleep apnea  . TONSILLECTOMY      Allergies  Allergen Reactions  . Adhesive [Tape]   . Latex     rash  . Other Hives    Plastic shopping bags Metal staples: rash, swelling    Current Outpatient Medications  Medication Sig Dispense Refill  . aspirin EC 81 MG tablet Take 81 mg by mouth at bedtime.    Marland Kitchen atenolol (TENORMIN) 25 MG  tablet TAKE ONE (1) TABLET BY MOUTH EACH DAY 30 tablet 1  . atorvastatin (LIPITOR) 40 MG tablet Take 1 tablet (40 mg total) by mouth daily at 6 PM. 30 tablet 11  . cholecalciferol (VITAMIN D) 1000 units tablet Take 1,000 Units by mouth daily.    . finasteride (PROSCAR) 5 MG tablet TAKE ONE (1) TABLET BY MOUTH EVERY DAY 30 tablet 1  . glucose blood (ONE TOUCH ULTRA TEST) test strip USE TO CHECK BLOOD SUGAR ONCE DAILY 100 each 5  . latanoprost (XALATAN) 0.005 % ophthalmic solution Place into both eyes at bedtime.    Marland Kitchen levothyroxine (SYNTHROID, LEVOTHROID) 50 MCG tablet TAKE ONE TABLET BY MOUTH EVERY MORNING BEFORE BREAKFAST 30 tablet 0  . loratadine (ALAVERT) 10 MG tablet Take 10 mg by mouth daily as needed for allergies.    . metFORMIN (GLUCOPHAGE-XR) 500 MG 24 hr tablet TAKE TWO (2) TABLETS BY MOUTH DAILY WITHBREAKFAST 60 tablet 0  . Multiple Vitamin (MULTIVITAMIN WITH MINERALS) TABS tablet Take 1 tablet by mouth daily.    Glory Rosebush DELICA LANCETS 02R MISC USE TO CHECK BLOOD SUGAR ONCE DAILY 100 each 1  . tamsulosin (FLOMAX) 0.4 MG CAPS capsule TAKE ONE (1) CAPSULE EACH DAY BY MOUTH 30 capsule 2   No current facility-administered medications for this visit.     ROS: See HPI for pertinent positives and negatives.   Physical Examination  Vitals:   05/27/17 1059 05/27/17 1108  BP: 130/72 130/80  Pulse: (!) 45   Resp: 18   Temp: 98.4 F (36.9 C)   TempSrc: Oral   SpO2: 96%   Weight: 218 lb (98.9 kg)    Body mass index is 29.57 kg/m.  General: A&O x 3, WDWN, male.   HENT: No gross abnormalities  Eyes: PERRLA  Pulmonary: Sym exp, respirations are non labored, good air movt, CTAB, no rales, rhonchi, or wheezing  Cardiac: Regular rhythm, bradycardic (on a beta blocker), no detected murmurs  Vascular: Vessel Right Left  Radial 2+Palpable 2+Palpable  Carotid Palpable, without bruit Palpable, without bruit  Aorta Not palpable N/A  Femoral 1+  Palpable 1+ Palpable  Popliteal Not palpable Not palpable  PT 2+ Palpable Not Palpable  DP not Palpable not palpable   Gastrointestinal: soft, NTND, -G/R, - HSM, - palpable masses, - CVAT B  Musculoskeletal: M/S 5/5 throughout, extremities without ischemic changes, R vein harvest incision healed  Skin: no rash, no cellulitis, no ulcers noted  Neurologic: Pain and light touch intact in extremities , Motor exam as listed above   ASSESSMENT: Harry Brown is a 76 y.o. male who is s/p right femoropopliteal  arterial bypass graft w/ NR ips GSV on 04/25/13. He no longer has claudication symptoms, no signs of ischemia in his lower extremities.  He has been walking less since his last visit, no barriers to walking other than he has resumed working and stays at a desk or is driving.  His atherosclerotic risk factors include almost in control DM, former smoker, and CAD.  DATA  Right LE Arterial Duplex (05/27/17): Patent right femoral to popliteal artery bypass graft with no significant stenosis.  All biphasic waveforms.  No significant change compared to exams on 04-11-15 and 05-21-16.    ABI (Date: 05/27/2017):  R:   ABI: 0.96 (was 1.0 on 05-21-16),   PT: tri  DP: mono  TBI:  0.61 (was 0.76)  L:   ABI: 0.74 (was 0.83),   PT: mono  DP: mono  TBI: 0.42 (was 0.50) Stable on the right  In the normal range, slight decline on the left with moderate disease. Tri and monophasic signals on the right, monophasic on the left.   PLAN:  Walk at least 30 minutes daily.   Based on the patient's vascular studies and examination, pt will return to clinic in 1 year with ABI's and right LE arterial duplex.  I advised him to notify us if he develops concerns re the circulation in his legs   I discussed in depth with the patient the nature of atherosclerosis, and emphasized the importance of maximal medical management including strict control of blood  pressure, blood glucose, and lipid levels, obtaining regular exercise, and continued cessation of smoking.  The patient is aware that without maximal medical management the underlying atherosclerotic disease process will progress, limiting the benefit of any interventions.  The patient was given information about PAD including signs, symptoms, treatment, what symptoms should prompt the patient to seek immediate medical care, and risk reduction measures to take.  Clemon Chambers, RN, MSN, FNP-C [facility] Phone: [phone]  Clinic MD: Donzetta Matters  05/27/17 11:25 AM

## 2017-05-30 NOTE — Addendum Note (Signed)
Addended by: Lianne Cure A on: 05/30/2017 10:02 AM   Modules accepted: Orders

## 2017-06-03 DIAGNOSIS — H401211 Low-tension glaucoma, right eye, mild stage: Secondary | ICD-10-CM | POA: Diagnosis not present

## 2017-06-03 DIAGNOSIS — H401221 Low-tension glaucoma, left eye, mild stage: Secondary | ICD-10-CM | POA: Diagnosis not present

## 2017-06-04 ENCOUNTER — Other Ambulatory Visit: Payer: Self-pay | Admitting: Family Medicine

## 2017-06-08 ENCOUNTER — Other Ambulatory Visit: Payer: Self-pay | Admitting: *Deleted

## 2017-06-08 MED ORDER — FINASTERIDE 5 MG PO TABS: ORAL_TABLET | ORAL | 1 refills | Status: DC

## 2017-06-10 ENCOUNTER — Other Ambulatory Visit: Payer: Self-pay | Admitting: Family Medicine

## 2017-06-13 ENCOUNTER — Ambulatory Visit (HOSPITAL_BASED_OUTPATIENT_CLINIC_OR_DEPARTMENT_OTHER)
Admission: RE | Admit: 2017-06-13 | Discharge: 2017-06-13 | Disposition: A | Discharge status: Home / Self Care | Payer: Medicare Other | Source: Ambulatory Visit | Attending: Medical | Admitting: Medical

## 2017-06-13 ENCOUNTER — Encounter: Payer: Self-pay | Admitting: Medical

## 2017-06-13 ENCOUNTER — Ambulatory Visit (INDEPENDENT_AMBULATORY_CARE_PROVIDER_SITE_OTHER): Payer: Medicare Other | Admitting: Medical

## 2017-06-13 VITALS — BP 113/47 | HR 36 | Temp 97.8°F | Resp 16 | Ht 72.0 in | Wt 217.8 lb

## 2017-06-13 DIAGNOSIS — M47896 Other spondylosis, lumbar region: Secondary | ICD-10-CM | POA: Insufficient documentation

## 2017-06-13 DIAGNOSIS — M545 Low back pain, unspecified: Secondary | ICD-10-CM

## 2017-06-13 DIAGNOSIS — M419 Scoliosis, unspecified: Secondary | ICD-10-CM | POA: Insufficient documentation

## 2017-06-13 MED ORDER — KETOROLAC TROMETHAMINE 60 MG/2ML IM SOLN
60.0000 mg | Freq: Once | INTRAMUSCULAR | Status: AC
  Administered 2017-06-13: 60 mg via INTRAMUSCULAR

## 2017-06-13 NOTE — Patient Instructions (Signed)
For your recent low back pain, we will give you Toradol 60 mg IM injection and ask that you continue salon pas patches.  Tomorrow afternoon can start low-dose ibuprofen 200-400 mg every 8 hours for 4 days.  I do think ibuprofen will help more than Tylenol.  Lumbar x-ray spine placed today.  You can get that done downstairs.  You can start back stretching exercises as tolerated once your pain level decreases.  Follow-up in 7-10 days or as needed.   Back Exercises If you have pain in your back, do these exercises 2-3 times each day or as told by your doctor. When the pain goes away, do the exercises once each day, but repeat the steps more times for each exercise (do more repetitions). If you do not have pain in your back, do these exercises once each day or as told by your doctor. Exercises Single Knee to Chest  Do these steps 3-5 times in a row for each leg: 1. Lie on your back on a firm bed or the floor with your legs stretched out. 2. Bring one knee to your chest. 3. Hold your knee to your chest by grabbing your knee or thigh. 4. Pull on your knee until you feel a gentle stretch in your lower back. 5. Keep doing the stretch for 10-30 seconds. 6. Slowly let go of your leg and straighten it.  Pelvic Tilt  Do these steps 5-10 times in a row: 1. Lie on your back on a firm bed or the floor with your legs stretched out. 2. Bend your knees so they point up to the ceiling. Your feet should be flat on the floor. 3. Tighten your lower belly (abdomen) muscles to press your lower back against the floor. This will make your tailbone point up to the ceiling instead of pointing down to your feet or the floor. 4. Stay in this position for 5-10 seconds while you gently tighten your muscles and breathe evenly.  Cat-Cow  Do these steps until your lower back bends more easily: 1. Get on your hands and knees on a firm surface. Keep your hands under your shoulders, and keep your knees under your hips.  You may put padding under your knees. 2. Let your head hang down, and make your tailbone point down to the floor so your lower back is round like the back of a cat. 3. Stay in this position for 5 seconds. 4. Slowly lift your head and make your tailbone point up to the ceiling so your back hangs low (sags) like the back of a cow. 5. Stay in this position for 5 seconds.  Press-Ups  Do these steps 5-10 times in a row: 1. Lie on your belly (face-down) on the floor. 2. Place your hands near your head, about shoulder-width apart. 3. While you keep your back relaxed and keep your hips on the floor, slowly straighten your arms to raise the top half of your body and lift your shoulders. Do not use your back muscles. To make yourself more comfortable, you may change where you place your hands. 4. Stay in this position for 5 seconds. 5. Slowly return to lying flat on the floor.  Bridges  Do these steps 10 times in a row: 1. Lie on your back on a firm surface. 2. Bend your knees so they point up to the ceiling. Your feet should be flat on the floor. 3. Tighten your butt muscles and lift your butt off of the floor  until your waist is almost as high as your knees. If you do not feel the muscles working in your butt and the back of your thighs, slide your feet 1-2 inches farther away from your butt. 4. Stay in this position for 3-5 seconds. 5. Slowly lower your butt to the floor, and let your butt muscles relax.  If this exercise is too easy, try doing it with your arms crossed over your chest. Belly Crunches  Do these steps 5-10 times in a row: 1. Lie on your back on a firm bed or the floor with your legs stretched out. 2. Bend your knees so they point up to the ceiling. Your feet should be flat on the floor. 3. Cross your arms over your chest. 4. Tip your chin a little bit toward your chest but do not bend your neck. 5. Tighten your belly muscles and slowly raise your chest just enough to lift  your shoulder blades a tiny bit off of the floor. 6. Slowly lower your chest and your head to the floor.  Back Lifts Do these steps 5-10 times in a row: 1. Lie on your belly (face-down) with your arms at your sides, and rest your forehead on the floor. 2. Tighten the muscles in your legs and your butt. 3. Slowly lift your chest off of the floor while you keep your hips on the floor. Keep the back of your head in line with the curve in your back. Look at the floor while you do this. 4. Stay in this position for 3-5 seconds. 5. Slowly lower your chest and your face to the floor.  Contact a doctor if:  Your back pain gets a lot worse when you do an exercise.  Your back pain does not lessen 2 hours after you exercise. If you have any of these problems, stop doing the exercises. Do not do them again unless your doctor says it is okay. Get help right away if:  You have sudden, very bad back pain. If this happens, stop doing the exercises. Do not do them again unless your doctor says it is okay. This information is not intended to replace advice given to you by your health care provider. Make sure you discuss any questions you have with your health care provider. Document Released: 06/26/2010 Document Revised: 10/30/2015 Document Reviewed: 07/18/2014 Elsevier Interactive Patient Education  [employer].

## 2017-06-13 NOTE — Progress Notes (Signed)
Subjective:    Patient ID: Harry Brown, male    DOB: 12/17/1940, 77 y.o.   MRN: [id number]  HPI  Pt states had pain for about a week. No known injury or fall. He states pain noted changing positions. Worse from sitting to standing. He tried tylenol for pain but did not help much. No pain shooting to legs. Pain mid lower back.  No chronic back pain history. Years since any pain.  Last day little less pain than other days.  Pain level describes 8/10 level pain. Rolling over in bed also hurts.  Pt has good kidney function and no history of GI bleeds.  No pain radiating to legs. No saddle anesthesia. No bladder dyfunction.   Review of Systems  Constitutional: Negative for chills, fatigue and fever.  Respiratory: Negative for cough, chest tightness, shortness of breath and wheezing.   Cardiovascular: Negative for chest pain and palpitations.  Gastrointestinal: Negative for abdominal pain, nausea and vomiting.  Musculoskeletal: Positive for back pain. Negative for neck pain and neck stiffness.  Skin: Negative for rash.  Neurological: Negative for dizziness, weakness and headaches.  Hematological: Negative for adenopathy. Does not bruise/bleed easily.  Psychiatric/Behavioral: Negative for behavioral problems and confusion.    Past Medical History:  Diagnosis Date  . Anemia 09/24/2013  . CAD (coronary artery disease)   . Cellulitis 05/14/2013   RLE  . Diverticulitis   . ED (erectile dysfunction)   . Glaucoma 08/18/2014  . History of sleep apnea    had surgery to correct  . Hypertension   . Hypothyroidism   . Medicare annual wellness visit, subsequent 04/13/2013  . Myocardial infarction ([place]) 11/05/1985  . Nocturia 03/15/2017  . Other and unspecified hyperlipidemia   . Penile lesion 02/24/2015  . Peripheral neuropathy 03/31/2012  . Peripheral vascular disease ([place])   . Postoperative anemia due to acute blood loss 09/24/2013  . Preventative health care 03/13/2016  . Type II  diabetes mellitus (HCC)    fasting 100-120  . Urinary urgency 11/22/2012     Social History   Socioeconomic History  . Marital status: Married    Spouse name: Not on file  . Number of children: 8   . Years of education: Not on file  . Highest education level: Not on file  Social Needs  . Financial resource strain: Not on file  . Food insecurity - worry: Not on file  . Food insecurity - inability: Not on file  . Transportation needs - medical: Not on file  . Transportation needs - non-medical: Not on file  Occupational History    Employer: [employer]  Tobacco Use  . Smoking status: Former Smoker    Packs/day: 1.50    Years: 50.00    Pack years: 75.00    Types: Cigarettes  . Smokeless tobacco: Never Used  . Tobacco comment: 05/14/2013 "quit smoking in ~ 2004; still Uses Comit lozenges"  Substance and Sexual Activity  . Alcohol use: No    Alcohol/week: 0.0 oz  . Drug use: No  . Sexual activity: Not Currently    Comment: lives with wife, retiring end of next week, no dietary restrictions  Other Topics Concern  . 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 ABDOMINAL AORTAGRAM  Oct. 9, 2014   Dr. Bridgett Larsson  . ABDOMINAL AORTAGRAM N/A 03/15/2013  Procedure: ABDOMINAL AORTAGRAM;  Surgeon: Conrad [place], MD;  Location: [facility] CATH LAB;  Service: Cardiovascular;  Laterality: N/A;  . ANTERIOR CERVICAL DECOMP/DISCECTOMY FUSION  ~ 2000  . CATARACT EXTRACTION Right   . CORONARY ARTERY BYPASS GRAFT  2001   "CABG X3" (05/14/2013)  . DENTAL SURGERY     "multiple teeth removed; trmimed top of mouth so dentures would fit" (05/14/2013)  . EYE SURGERY     cataracts  . FEMORAL-POPLITEAL BYPASS GRAFT Right 04/25/2013   Procedure: RIGHT FEMORAL TO ABOVE KNEE POPLITEAL BYPASS GRAFT WITH RIGHT GREATER SAPHENOUS VEIN HARVEST; ULTRASOUND GUIDED;  Surgeon: Conrad [place],  MD;  Location: [facility] OR;  Service: Vascular;  Laterality: Right;  . GUM SURGERY  [id number]   "had bone scraped; had periodontal disease" (05/14/2013)  . PALATE / UVULA BIOPSY / EXCISION  2003   Removed due to sleep apnea  . TONSILLECTOMY      Family History  Problem Relation Age of Onset  . Hyperlipidemia Mother   . Heart disease Mother   . Diabetes Mother   . Hypertension Mother   . Heart disease Father   . Asthma Father   . Arthritis Father   . Heart disease Sister   . Heart disease Sister        s/p bypass x 2  . Lupus Sister   . Heart disease Brother        MI waiting on heart transplant when died  . Heart disease Brother        massive MI  . Cancer Neg Hx     Allergies  Allergen Reactions  . Adhesive [Tape]   . Latex     rash  . Other Hives    Plastic shopping bags Metal staples: rash, swelling    Current Outpatient Medications on File Prior to Visit  Medication Sig Dispense Refill  . aspirin EC 81 MG tablet Take 81 mg by mouth at bedtime.    Marland Kitchen atenolol (TENORMIN) 25 MG tablet TAKE ONE (1) TABLET BY MOUTH EACH DAY 30 tablet 1  . atorvastatin (LIPITOR) 40 MG tablet Take 1 tablet (40 mg total) by mouth daily at 6 PM. 30 tablet 11  . cholecalciferol (VITAMIN D) 1000 units tablet Take 1,000 Units by mouth daily.    . finasteride (PROSCAR) 5 MG tablet TAKE ONE (1) TABLET BY MOUTH EVERY DAY 30 tablet 1  . glucose blood (ONE TOUCH ULTRA TEST) test strip USE TO CHECK BLOOD SUGAR ONCE DAILY 100 each 5  . latanoprost (XALATAN) 0.005 % ophthalmic solution Place into both eyes at bedtime.    Marland Kitchen levothyroxine (SYNTHROID, LEVOTHROID) 50 MCG tablet TAKE ONE TABLET BY MOUTH EVERY MORNING BEFORE BREAKFAST 30 tablet 0  . loratadine (ALAVERT) 10 MG tablet Take 10 mg by mouth daily as needed for allergies.    . metFORMIN (GLUCOPHAGE-XR) 500 MG 24 hr tablet TAKE TWO TABLETS BY MOUTH DAILY WITH BREAKFAST 60 tablet 0  . Multiple Vitamin (MULTIVITAMIN WITH MINERALS) TABS tablet Take 1 tablet  by mouth daily.    Glory Rosebush DELICA LANCETS 60A MISC USE TO CHECK BLOOD SUGAR ONCE DAILY 100 each 1  . tamsulosin (FLOMAX) 0.4 MG CAPS capsule TAKE ONE (1) CAPSULE EACH DAY BY MOUTH 30 capsule 2   No current facility-administered medications on file prior to visit.     BP (!) 113/47   Pulse (!) 36   Temp 97.8 F (36.6 C) (Oral)   Resp 16   Ht 6' (1.829  m)   Wt 217 lb 12.8 oz (98.8 kg)   SpO2 98%   BMI 29.54 kg/m       Objective:   Physical Exam  General Appearance- Not in acute distress.    Chest and Lung Exam Auscultation: Breath sounds:-Normal. Clear even and unlabored. Adventitious sounds:- No Adventitious sounds.  Cardiovascular Auscultation:Rythm - Regular, rate and rythm. Heart Sounds -Normal heart sounds.  Abdomen Inspection:-Inspection Normal.  Palpation/Perucssion: Palpation and Percussion of the abdomen reveal- Non Tender, No Rebound tenderness, No rigidity(Guarding) and No Palpable abdominal masses.  Liver:-Normal.  Spleen:- Normal.   Back Mid lumbar spine tenderness to palpation. Mostly sciatic region rt side.  Pain on straight leg lift. Pain on lateral movements and flexion/extension of the spine.  Lower ext neurologic  L5-S1 sensation intact bilaterally. Normal patellar reflexes bilaterally. No foot drop bilaterally.      Assessment & Plan:  For your recent low back pain, we will give you Toradol 60 mg IM injection and ask that you continue salon pas patches.  Tomorrow afternoon can start low-dose ibuprofen 200-400 mg every 8 hours for 4 days.  I do think ibuprofen will help more than Tylenol.  Lumbar x-ray spine placed today.  You can get that done downstairs.  You can start back stretching exercises as tolerated once your pain level decreases.  Follow-up in 7-10 days or as needed.  Maeci Kalbfleisch, Percell Miller, PA-C

## 2017-06-15 ENCOUNTER — Telehealth: Payer: Self-pay | Admitting: Family Medicine

## 2017-06-15 NOTE — Telephone Encounter (Signed)
Spoke with pt regarding copy of x-ray

## 2017-06-15 NOTE — Telephone Encounter (Signed)
Copied from [place] [id number]. Topic: Quick Communication - See Telephone Encounter >> Jun 15, 2017 10:30 AM Corie Chiquito, NT wrote: CRM for notification. See Telephone encounter for: 06/15/17. Patients wife called because she would like to know if them could get a copy of the x-ray that her husband had done in the office on 06-13-2017. Wife stated that she is taking him to  the cairopracter  this afternoon only if they can get the x-ray. If someone could give either of them a call back about this at [phone]

## 2017-07-05 ENCOUNTER — Other Ambulatory Visit: Payer: Self-pay | Admitting: Family Medicine

## 2017-07-12 ENCOUNTER — Other Ambulatory Visit: Payer: Self-pay | Admitting: Family Medicine

## 2017-07-26 ENCOUNTER — Other Ambulatory Visit: Payer: Self-pay | Admitting: Family Medicine

## 2017-08-03 ENCOUNTER — Other Ambulatory Visit: Payer: Self-pay | Admitting: Family Medicine

## 2017-08-12 ENCOUNTER — Other Ambulatory Visit: Payer: Self-pay | Admitting: Family Medicine

## 2017-09-12 ENCOUNTER — Other Ambulatory Visit: Payer: Self-pay | Admitting: Family Medicine

## 2017-09-13 ENCOUNTER — Ambulatory Visit: Payer: Medicare Other | Admitting: Family Medicine

## 2017-09-16 ENCOUNTER — Ambulatory Visit (INDEPENDENT_AMBULATORY_CARE_PROVIDER_SITE_OTHER): Payer: Medicare Other | Admitting: Family Medicine

## 2017-09-16 ENCOUNTER — Encounter: Payer: Self-pay | Admitting: Family Medicine

## 2017-09-16 VITALS — BP 115/40 | HR 76 | Temp 98.2°F | Resp 14 | Ht 72.05 in | Wt 216.2 lb

## 2017-09-16 DIAGNOSIS — D649 Anemia, unspecified: Secondary | ICD-10-CM | POA: Diagnosis not present

## 2017-09-16 DIAGNOSIS — I10 Essential (primary) hypertension: Secondary | ICD-10-CM

## 2017-09-16 DIAGNOSIS — E039 Hypothyroidism, unspecified: Secondary | ICD-10-CM | POA: Diagnosis not present

## 2017-09-16 DIAGNOSIS — G4730 Sleep apnea, unspecified: Secondary | ICD-10-CM | POA: Diagnosis not present

## 2017-09-16 DIAGNOSIS — E1122 Type 2 diabetes mellitus with diabetic chronic kidney disease: Secondary | ICD-10-CM | POA: Diagnosis not present

## 2017-09-16 DIAGNOSIS — R739 Hyperglycemia, unspecified: Secondary | ICD-10-CM

## 2017-09-16 DIAGNOSIS — I499 Cardiac arrhythmia, unspecified: Secondary | ICD-10-CM

## 2017-09-16 DIAGNOSIS — E7800 Pure hypercholesterolemia, unspecified: Secondary | ICD-10-CM | POA: Diagnosis not present

## 2017-09-16 DIAGNOSIS — R351 Nocturia: Secondary | ICD-10-CM | POA: Diagnosis not present

## 2017-09-16 HISTORY — DX: Sleep apnea, unspecified: G47.30

## 2017-09-16 LAB — CBC
HCT: 37.5 % — ABNORMAL LOW (ref 39.0–52.0)
Hemoglobin: 12.4 g/dL — ABNORMAL LOW (ref 13.0–17.0)
MCHC: 33.2 g/dL (ref 30.0–36.0)
MCV: 91.5 fl (ref 78.0–100.0)
Platelets: 209 10*3/uL (ref 150.0–400.0)
RBC: 4.1 Mil/uL — ABNORMAL LOW (ref 4.22–5.81)
RDW: 15 % (ref 11.5–15.5)
WBC: 7.5 10*3/uL (ref 4.0–10.5)

## 2017-09-16 LAB — URINALYSIS
Bilirubin Urine: NEGATIVE
Hgb urine dipstick: NEGATIVE
Ketones, ur: NEGATIVE
Leukocytes, UA: NEGATIVE
Nitrite: NEGATIVE
Specific Gravity, Urine: 1.01 (ref 1.000–1.030)
Total Protein, Urine: NEGATIVE
Urine Glucose: NEGATIVE
Urobilinogen, UA: 4 — AB (ref 0.0–1.0)
pH: 7 (ref 5.0–8.0)

## 2017-09-16 LAB — LIPID PANEL
Cholesterol: 117 mg/dL (ref 0–200)
HDL: 39.8 mg/dL (ref >39.00)
LDL Cholesterol: 66 mg/dL (ref 0–99)
NonHDL: 76.71
Total CHOL/HDL Ratio: 3
Triglycerides: 52 mg/dL (ref 0.0–149.0)
VLDL: 10.4 mg/dL (ref 0.0–40.0)

## 2017-09-16 LAB — COMPREHENSIVE METABOLIC PANEL
ALT: 18 U/L (ref 0–53)
AST: 16 U/L (ref 0–37)
Albumin: 3.9 g/dL (ref 3.5–5.2)
Alkaline Phosphatase: 59 U/L (ref 39–117)
BUN: 25 mg/dL — ABNORMAL HIGH (ref 6–23)
CO2: 28 mEq/L (ref 19–32)
Calcium: 9.4 mg/dL (ref 8.4–10.5)
Chloride: 106 mEq/L (ref 96–112)
Creatinine, Ser: 1.09 mg/dL (ref 0.40–1.50)
GFR: 69.57 mL/min (ref >60.00)
Glucose, Bld: 111 mg/dL — ABNORMAL HIGH (ref 70–99)
Potassium: 5 mEq/L (ref 3.5–5.1)
Sodium: 139 mEq/L (ref 135–145)
Total Bilirubin: 0.7 mg/dL (ref 0.2–1.2)
Total Protein: 7.1 g/dL (ref 6.0–8.3)

## 2017-09-16 LAB — TSH: TSH: 4.94 u[IU]/mL — ABNORMAL HIGH (ref 0.35–4.50)

## 2017-09-16 LAB — HEMOGLOBIN A1C: Hgb A1c MFr Bld: 7.1 % — ABNORMAL HIGH (ref 4.6–6.5)

## 2017-09-16 MED ORDER — ATENOLOL 25 MG PO TABS: 12.5000 mg | ORAL_TABLET | Freq: Every day | ORAL | 3 refills | Status: DC

## 2017-09-16 NOTE — Assessment & Plan Note (Signed)
hgba1c acceptable, minimize simple carbs. Increase exercise as tolerated. Continue current meds 

## 2017-09-16 NOTE — Progress Notes (Signed)
Subjective:  I acted as a Education administrator for [employer]. Yancey Flemings, [place]   Patient ID: Harry Brown, male    DOB: 1940-12-04, 77 y.o.   MRN: [id number]  Chief Complaint  Patient presents with  . Follow-up    HPI  Patient is in today for follow up visit and his only complaint is worsening fatigue and poor sleep. He wakes up multiple times a night to urinate but denies any dysuria or hematuria. He denies any recent febrile illness or hospitalizations. He has trouble falling asleep and staying asleep. He confirms he was diagnosed with sleep apnea many years ago but after surgery to debride his oropharynx he has done well with minimal snoring. Unfortunately his sleep continues to worsen. He notes his sugars have been good ranging from 120s to 130s. Denies CP/palp/SOB/HA/congestion/fevers/GI or GU c/o. Taking meds as prescribed  Patient Care Team: [name], MD as PCP - General (Family Medicine) Conrad Bamberg, MD as Consulting Physician (Vascular Surgery) Calvert Cantor, MD as Consulting Physician (Ophthalmology) Malachy Chamber, DDS as Consulting Physician (Periodontics)   Past Medical History:  Diagnosis Date  . Anemia 09/24/2013  . CAD (coronary artery disease)   . Cellulitis 05/14/2013   RLE  . Diverticulitis   . ED (erectile dysfunction)   . Glaucoma 08/18/2014  . History of sleep apnea    had surgery to correct  . Hypertension   . Hypothyroidism   . Medicare annual wellness visit, subsequent 04/13/2013  . Myocardial infarction ([place]) 11/05/1985  . Nocturia 03/15/2017  . Other and unspecified hyperlipidemia   . Penile lesion 02/24/2015  . Peripheral neuropathy 03/31/2012  . Peripheral vascular disease ([place])   . Postoperative anemia due to acute blood loss 09/24/2013  . Preventative health care 03/13/2016  . Sleep apnea 09/16/2017  . Type II diabetes mellitus (HCC)    fasting 100-120  . Urinary urgency 11/22/2012    Past Surgical History:  Procedure Laterality Date  . ABDOMINAL  AORTAGRAM  Oct. 9, 2014   Dr. Bridgett Larsson  . ABDOMINAL AORTAGRAM N/A 03/15/2013   Procedure: ABDOMINAL Maxcine Ham;  Surgeon: Conrad [place], MD;  Location: [facility] CATH LAB;  Service: Cardiovascular;  Laterality: N/A;  . ANTERIOR CERVICAL DECOMP/DISCECTOMY FUSION  ~ 2000  . CATARACT EXTRACTION Right   . CORONARY ARTERY BYPASS GRAFT  2001   "CABG X3" (05/14/2013)  . DENTAL SURGERY     "multiple teeth removed; trmimed top of mouth so dentures would fit" (05/14/2013)  . EYE SURGERY     cataracts  . FEMORAL-POPLITEAL BYPASS GRAFT Right 04/25/2013   Procedure: RIGHT FEMORAL TO ABOVE KNEE POPLITEAL BYPASS GRAFT WITH RIGHT GREATER SAPHENOUS VEIN HARVEST; ULTRASOUND GUIDED;  Surgeon: Conrad [place], MD;  Location: [facility] OR;  Service: Vascular;  Laterality: Right;  . GUM SURGERY  [phone]   "had bone scraped; had periodontal disease" (05/14/2013)  . PALATE / UVULA BIOPSY / EXCISION  2003   Removed due to sleep apnea  . TONSILLECTOMY      Family History  Problem Relation Age of Onset  . Hyperlipidemia Mother   . Heart disease Mother   . Diabetes Mother   . Hypertension Mother   . Heart disease Father   . Asthma Father   . Arthritis Father   . Heart disease Sister   . Heart disease Sister        s/p bypass x 2  . Lupus Sister   . Heart disease Brother        MI  waiting on heart transplant when died  . Heart disease Brother        massive MI  . Cancer Neg Hx     Social History   Socioeconomic History  . Marital status: Married    Spouse name: Not on file  . Number of children: 8   . Years of education: Not on file  . Highest education level: Not on file  Occupational History    Employer: [employer]  Social Needs  . Financial resource strain: Not on file  . Food insecurity:    Worry: Not on file    Inability: Not on file  . Transportation needs:    Medical: Not on file    Non-medical: Not on file  Tobacco Use  . Smoking status: Former Smoker    Packs/day: 1.50    Years: 50.00     Pack years: 75.00    Types: Cigarettes  . Smokeless tobacco: Never Used  . Tobacco comment: 05/14/2013 "quit smoking in ~ 2004; still Uses Comit lozenges"  Substance and Sexual Activity  . Alcohol use: No    Alcohol/week: 0.0 oz  . Drug use: No  . Sexual activity: Not Currently    Comment: lives with wife, retiring end of next week, no dietary restrictions  Lifestyle  . Physical activity:    Days per week: Not on file    Minutes per session: Not on file  . Stress: Not on file  Relationships  . Social connections:    Talks on phone: Not on file    Gets together: Not on file    Attends religious service: Not on file    Active member of club or organization: Not on file    Attends meetings of clubs or organizations: Not on file    Relationship status: Not on file  . Intimate partner violence:    Fear of current or ex partner: Not on file    Emotionally abused: Not on file    Physically abused: Not on file    Forced sexual activity: Not on file  Other Topics Concern  . 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 aspirin EC 81 MG tablet Take 81 mg by mouth at bedtime.    Marland Kitchen atenolol (TENORMIN) 25 MG tablet TAKE ONE (1) TABLET BY MOUTH EVERY DAY 30 tablet 1  . atorvastatin (LIPITOR) 40 MG tablet TAKE ONE (1) TABLET BY MOUTH EVERY DAY AT 6PM. 30 tablet 11  . cholecalciferol (VITAMIN D) 1000 units tablet Take 1,000 Units by mouth daily.    . finasteride (PROSCAR) 5 MG tablet TAKE ONE (1) TABLET BY MOUTH EACH DAY 30 tablet 2  . glucose blood (ONE TOUCH ULTRA TEST) test strip USE TO CHECK BLOOD SUGAR ONCE DAILY 100 each 5  . latanoprost (XALATAN) 0.005 % ophthalmic solution Place into both eyes at bedtime.    Marland Kitchen levothyroxine (SYNTHROID, LEVOTHROID) 50 MCG tablet TAKE ONE TABLET BY MOUTH EVERY MORNING BEFORE BREAKFAST 30 tablet 0  . loratadine  (ALAVERT) 10 MG tablet Take 10 mg by mouth daily as needed for allergies.    . metFORMIN (GLUCOPHAGE-XR) 500 MG 24 hr tablet TAKE TWO (2) TABLETS BY MOUTH DAILY WITHBREAKFAST 60 tablet 2  . Multiple Vitamin (MULTIVITAMIN WITH MINERALS) TABS tablet Take 1 tablet by mouth daily.    Marland Kitchen  ONETOUCH DELICA LANCETS 89F MISC USE TO CHECK BLOOD SUGAR ONCE DAILY 100 each 1  . tamsulosin (FLOMAX) 0.4 MG CAPS capsule TAKE ONE (1) CAPSULE EACH DAY BY MOUTH 30 capsule 2   No facility-administered medications prior to visit.     Allergies  Allergen Reactions  . Adhesive [Tape]   . Latex     rash  . Other Hives    Plastic shopping bags Metal staples: rash, swelling    Review of Systems  Constitutional: Positive for malaise/fatigue. Negative for fever.  HENT: Negative for congestion.   Eyes: Negative for blurred vision.  Respiratory: Negative for shortness of breath.   Cardiovascular: Negative for chest pain, palpitations and leg swelling.  Gastrointestinal: Negative for abdominal pain, blood in stool and nausea.  Genitourinary: Positive for frequency. Negative for dysuria, hematuria and urgency.  Musculoskeletal: Negative for falls.  Skin: Negative for rash.  Neurological: Negative for dizziness, loss of consciousness and headaches.  Endo/Heme/Allergies: Negative for environmental allergies.  Psychiatric/Behavioral: Negative for depression. The patient has insomnia. The patient is not nervous/anxious.        Objective:    Physical Exam  Constitutional: He is oriented to person, place, and time. He appears well-developed and well-nourished. No distress.  HENT:  Head: Normocephalic and atraumatic.  Nose: Nose normal.  Eyes: Right eye exhibits no discharge. Left eye exhibits no discharge.  Neck: Normal range of motion. Neck supple.  Cardiovascular: Normal rate and regular rhythm.  Pulmonary/Chest: Effort normal and breath sounds normal.  Abdominal: Soft. Bowel sounds are normal. There is no  tenderness.  Musculoskeletal: He exhibits no edema.  Neurological: He is alert and oriented to person, place, and time.  Skin: Skin is warm and dry.  Psychiatric: He has a normal mood and affect.  Nursing note and vitals reviewed.   BP (!) 115/40 (BP Location: Left Arm, Patient Position: Sitting, Cuff Size: Large)   Pulse 76   Temp 98.2 F (36.8 C) (Oral)   Resp 14   Ht 6' 0.05" (1.83 m)   Wt 216 lb 3.2 oz (98.1 kg)   SpO2 98%   BMI 29.28 kg/m  Wt Readings from Last 3 Encounters:  09/16/17 216 lb 3.2 oz (98.1 kg)  06/13/17 217 lb 12.8 oz (98.8 kg)  05/27/17 218 lb (98.9 kg)   BP Readings from Last 3 Encounters:  09/16/17 (!) 115/40  06/13/17 (!) 113/47  05/27/17 130/80     Immunization History  Administered Date(s) Administered  . Influenza Split 03/31/2012  . Influenza, High Dose Seasonal PF 03/20/2013, 02/24/2015, 03/11/2016, 03/15/2017  . Influenza,inj,Quad PF,6+ Mos 01/29/2014  . Pneumococcal Conjugate-13 03/20/2013  . Pneumococcal Polysaccharide-23 02/24/2015  . Td 06/07/2009    Health Maintenance  Topic Date Due  . OPHTHALMOLOGY EXAM  03/27/2015  . FOOT EXAM  03/11/2017  . URINE MICROALBUMIN  03/11/2017  . INFLUENZA VACCINE  01/05/2018  . HEMOGLOBIN A1C  03/18/2018  . TETANUS/TDAP  06/08/2019  . PNA vac Low Risk Adult  Completed    Lab Results  Component Value Date   WBC 7.5 09/16/2017   HGB 12.4 (L) 09/16/2017   HCT 37.5 (L) 09/16/2017   PLT 209.0 09/16/2017   GLUCOSE 111 (H) 09/16/2017   CHOL 117 09/16/2017   TRIG 52.0 09/16/2017   HDL 39.80 09/16/2017   LDLCALC 66 09/16/2017   ALT 18 09/16/2017   AST 16 09/16/2017   NA 139 09/16/2017   K 5.0 09/16/2017   CL 106 09/16/2017   CREATININE 1.09  09/16/2017   BUN 25 (H) 09/16/2017   CO2 28 09/16/2017   TSH 4.94 (H) 09/16/2017   PSA 0.46 03/11/2016   INR 0.99 04/20/2013   HGBA1C 7.1 (H) 09/16/2017   MICROALBUR 0.7 03/11/2016    Lab Results  Component Value Date   TSH 4.94 (H) 09/16/2017    Lab Results  Component Value Date   WBC 7.5 09/16/2017   HGB 12.4 (L) 09/16/2017   HCT 37.5 (L) 09/16/2017   MCV 91.5 09/16/2017   PLT 209.0 09/16/2017   Lab Results  Component Value Date   NA 139 09/16/2017   K 5.0 09/16/2017   CO2 28 09/16/2017   GLUCOSE 111 (H) 09/16/2017   BUN 25 (H) 09/16/2017   CREATININE 1.09 09/16/2017   BILITOT 0.7 09/16/2017   ALKPHOS 59 09/16/2017   AST 16 09/16/2017   ALT 18 09/16/2017   PROT 7.1 09/16/2017   ALBUMIN 3.9 09/16/2017   CALCIUM 9.4 09/16/2017   GFR 69.57 09/16/2017   Lab Results  Component Value Date   CHOL 117 09/16/2017   Lab Results  Component Value Date   HDL 39.80 09/16/2017   Lab Results  Component Value Date   LDLCALC 66 09/16/2017   Lab Results  Component Value Date   TRIG 52.0 09/16/2017   Lab Results  Component Value Date   CHOLHDL 3 09/16/2017   Lab Results  Component Value Date   HGBA1C 7.1 (H) 09/16/2017         Assessment & Plan:   Problem List Items Addressed This Visit    Hypothyroidism    On Levothyroxine but TSH still elevated will increase Levothyroxine to 75 mcg daily. Continue to monitor      Relevant Medications   atenolol (TENORMIN) 25 MG tablet   DM (diabetes mellitus) (HCC)    hgba1c acceptable, minimize simple carbs. Increase exercise as tolerated. Continue current meds      Relevant Orders   Hemoglobin A1c (Completed)   Hyperlipidemia    Encouraged heart healthy diet, increase exercise, avoid trans fats, consider a krill oil cap daily      Relevant Medications   atenolol (TENORMIN) 25 MG tablet   Other Relevant Orders   Lipid panel (Completed)   Anemia    Increase leafy greens, consider increased lean red meat and using cast iron cookware. Continue to monitor, report any concerns      Hypertension    Well controlled, no changes to meds. Encouraged heart healthy diet such as the DASH diet and exercise as tolerated.       Relevant Medications   atenolol  (TENORMIN) 25 MG tablet   Other Relevant Orders   CBC (Completed)   Comprehensive metabolic panel (Completed)   TSH (Completed)   Nocturia - Primary   Relevant Orders   Urinalysis (Completed)   Urine Culture (Completed)   Sleep apnea    Had a surgery in oropharynx and has been better since then, consider repeat study if fatigue persists and no cause is found. Is noting insomnia. Encouraged good sleep hygiene such as dark, quiet room. No blue/green glowing lights such as computer screens in bedroom. No alcohol or stimulants in evening. Cut down on caffeine as able. Regular exercise is helpful but not just prior to bed time. Consider Melatonin prn      RESOLVED: Hyperglycemia    hgba1c acceptable, minimize simple carbs. Increase exercise as tolerated.       Irregular heartbeat    He follows with cardiology  and has not had any complaints of palpitations, chest pain or SOB but he did endorse increased fatigue. EKG showed no significant change from 11/18. Showed sinus rhythm with frequent PVCs in pattern in bigeminy. He will report any worsening symptoms      Relevant Orders   EKG 12-Lead (Completed)      I am having Harry Brown start on atenolol. I am also having him maintain his multivitamin with minerals, loratadine, aspirin EC, latanoprost, cholecalciferol, glucose blood, ONETOUCH DELICA LANCETS 08U, tamsulosin, atenolol, finasteride, metFORMIN, atorvastatin, and levothyroxine.  Meds ordered this encounter  Medications  . atenolol (TENORMIN) 25 MG tablet    Sig: Take 0.5 tablets (12.5 mg total) by mouth daily.    Dispense:  90 tablet    Refill:  3    CMA served as scribe during this visit. History, Physical and Plan performed by medical provider. Documentation and orders reviewed and attested to.  Penni Homans, MD

## 2017-09-16 NOTE — Assessment & Plan Note (Signed)
Increase leafy greens, consider increased lean red meat and using cast iron cookware. Continue to monitor, report any concerns 

## 2017-09-16 NOTE — Assessment & Plan Note (Signed)
Encouraged heart healthy diet, increase exercise, avoid trans fats, consider a krill oil cap daily 

## 2017-09-16 NOTE — Assessment & Plan Note (Addendum)
Had a surgery in oropharynx and has been better since then, consider repeat study if fatigue persists and no cause is found. Is noting insomnia. Encouraged good sleep hygiene such as dark, quiet room. No blue/green glowing lights such as computer screens in bedroom. No alcohol or stimulants in evening. Cut down on caffeine as able. Regular exercise is helpful but not just prior to bed time. Consider Melatonin prn

## 2017-09-16 NOTE — Assessment & Plan Note (Signed)
Well controlled, no changes to meds. Encouraged heart healthy diet such as the DASH diet and exercise as tolerated.  °

## 2017-09-16 NOTE — Assessment & Plan Note (Signed)
hgba1c acceptable, minimize simple carbs. Increase exercise as tolerated.  

## 2017-09-16 NOTE — Patient Instructions (Addendum)
Melatonin 2 to 10 mg at bedtime to help with sleep  Insomnia Insomnia is a sleep disorder that makes it difficult to fall asleep or to stay asleep. Insomnia can cause tiredness (fatigue), low energy, difficulty concentrating, mood swings, and poor performance at work or school. There are three different ways to classify insomnia:  Difficulty falling asleep.  Difficulty staying asleep.  Waking up too early in the morning.  Any type of insomnia can be long-term (chronic) or short-term (acute). Both are common. Short-term insomnia usually lasts for three months or less. Chronic insomnia occurs at least three times a week for longer than three months. What are the causes? Insomnia may be caused by another condition, situation, or substance, such as:  Anxiety.  Certain medicines.  Gastroesophageal reflux disease (GERD) or other gastrointestinal conditions.  Asthma or other breathing conditions.  Restless legs syndrome, sleep apnea, or other sleep disorders.  Chronic pain.  Menopause. This may include hot flashes.  Stroke.  Abuse of alcohol, tobacco, or illegal drugs.  Depression.  Caffeine.  Neurological disorders, such as Alzheimer disease.  An overactive thyroid (hyperthyroidism).  The cause of insomnia may not be known. What increases the risk? Risk factors for insomnia include:  Gender. Women are more commonly affected than men.  Age. Insomnia is more common as you get older.  Stress. This may involve your professional or personal life.  Income. Insomnia is more common in people with lower income.  Lack of exercise.  Irregular work schedule or night shifts.  Traveling between different time zones.  What are the signs or symptoms? If you have insomnia, trouble falling asleep or trouble staying asleep is the main symptom. This may lead to other symptoms, such as:  Feeling fatigued.  Feeling nervous about going to sleep.  Not feeling rested in the  morning.  Having trouble concentrating.  Feeling irritable, anxious, or depressed.  How is this treated? Treatment for insomnia depends on the cause. If your insomnia is caused by an underlying condition, treatment will focus on addressing the condition. Treatment may also include:  Medicines to help you sleep.  Counseling or therapy.  Lifestyle adjustments.  Follow these instructions at home:  Take medicines only as directed by your health care provider.  Keep regular sleeping and waking hours. Avoid naps.  Keep a sleep diary to help you and your health care provider figure out what could be causing your insomnia. Include: ? When you sleep. ? When you wake up during the night. ? How well you sleep. ? How rested you feel the next day. ? Any side effects of medicines you are taking. ? What you eat and drink.  Make your bedroom a comfortable place where it is easy to fall asleep: ? Put up shades or special blackout curtains to block light from outside. ? Use a white noise machine to block noise. ? Keep the temperature cool.  Exercise regularly as directed by your health care provider. Avoid exercising right before bedtime.  Use relaxation techniques to manage stress. Ask your health care provider to suggest some techniques that may work well for you. These may include: ? Breathing exercises. ? Routines to release muscle tension. ? Visualizing peaceful scenes.  Cut back on alcohol, caffeinated beverages, and cigarettes, especially close to bedtime. These can disrupt your sleep.  Do not overeat or eat spicy foods right before bedtime. This can lead to digestive discomfort that can make it hard for you to sleep.  Limit screen use before  bedtime. This includes: ? Watching TV. ? Using your smartphone, tablet, and computer.  Stick to a routine. This can help you fall asleep faster. Try to do a quiet activity, brush your teeth, and go to bed at the same time each night.  Get  out of bed if you are still awake after 15 minutes of trying to sleep. Keep the lights down, but try reading or doing a quiet activity. When you feel sleepy, go back to bed.  Make sure that you drive carefully. Avoid driving if you feel very sleepy.  Keep all follow-up appointments as directed by your health care provider. This is important. Contact a health care provider if:  You are tired throughout the day or have trouble in your daily routine due to sleepiness.  You continue to have sleep problems or your sleep problems get worse. Get help right away if:  You have serious thoughts about hurting yourself or someone else. This information is not intended to replace advice given to you by your health care provider. Make sure you discuss any questions you have with your health care provider. Document Released: 05/21/2000 Document Revised: 10/24/2015 Document Reviewed: 02/22/2014 Elsevier Interactive Patient Education  [employer].

## 2017-09-17 LAB — URINE CULTURE
MICRO NUMBER:: 90453395
SPECIMEN QUALITY:: ADEQUATE

## 2017-09-18 DIAGNOSIS — I499 Cardiac arrhythmia, unspecified: Secondary | ICD-10-CM | POA: Insufficient documentation

## 2017-09-18 NOTE — Assessment & Plan Note (Addendum)
On Levothyroxine but TSH still elevated will increase Levothyroxine to 75 mcg daily. Continue to monitor

## 2017-09-18 NOTE — Assessment & Plan Note (Signed)
He follows with cardiology and has not had any complaints of palpitations, chest pain or SOB but he did endorse increased fatigue. EKG showed no significant change from 11/18. Showed sinus rhythm with frequent PVCs in pattern in bigeminy. He will report any worsening symptoms

## 2017-09-19 ENCOUNTER — Other Ambulatory Visit: Payer: Self-pay

## 2017-09-19 MED ORDER — LEVOTHYROXINE SODIUM 75 MCG PO TABS: 75.0000 ug | ORAL_TABLET | Freq: Every day | ORAL | 3 refills | Status: DC

## 2017-09-27 ENCOUNTER — Other Ambulatory Visit: Payer: Self-pay | Admitting: Family Medicine

## 2017-10-04 DIAGNOSIS — H401221 Low-tension glaucoma, left eye, mild stage: Secondary | ICD-10-CM | POA: Diagnosis not present

## 2017-10-04 DIAGNOSIS — E119 Type 2 diabetes mellitus without complications: Secondary | ICD-10-CM | POA: Diagnosis not present

## 2017-10-04 DIAGNOSIS — H401211 Low-tension glaucoma, right eye, mild stage: Secondary | ICD-10-CM | POA: Diagnosis not present

## 2017-10-12 ENCOUNTER — Other Ambulatory Visit: Payer: Self-pay | Admitting: Family Medicine

## 2017-10-18 ENCOUNTER — Ambulatory Visit (INDEPENDENT_AMBULATORY_CARE_PROVIDER_SITE_OTHER): Payer: Medicare Other | Admitting: Family Medicine

## 2017-10-18 VITALS — BP 143/68 | HR 90

## 2017-10-18 DIAGNOSIS — I10 Essential (primary) hypertension: Secondary | ICD-10-CM

## 2017-10-18 NOTE — Progress Notes (Signed)
Pt here for blood pressure check per Dr Charlett Blake.  BP Readings from Last 3 Encounters:  09/16/17 (!) 115/40  06/13/17 (!) 113/47  05/27/17 130/80   Pt is currently taking: Atenolol 25mg  was reduced to 1/2 tablet once a day at last visit.  BP today = 161/82 HR = 66  Repeat BP = 143/68 HR = 90  Advised pt to continue current medication dose and let us know if he develops any new symptoms or feels poorly. He was also advised to watch his salt intake and follow up as scheduled on 12/15/17 with PCP  .Nursing blood pressure check note reviewed. Agree with documention and plan.

## 2017-11-02 ENCOUNTER — Other Ambulatory Visit: Payer: Self-pay | Admitting: Family Medicine

## 2017-12-15 ENCOUNTER — Encounter: Payer: Self-pay | Admitting: Family Medicine

## 2017-12-15 ENCOUNTER — Ambulatory Visit (INDEPENDENT_AMBULATORY_CARE_PROVIDER_SITE_OTHER): Payer: Medicare Other | Admitting: Family Medicine

## 2017-12-15 DIAGNOSIS — I10 Essential (primary) hypertension: Secondary | ICD-10-CM

## 2017-12-15 DIAGNOSIS — E1122 Type 2 diabetes mellitus with diabetic chronic kidney disease: Secondary | ICD-10-CM | POA: Diagnosis not present

## 2017-12-15 DIAGNOSIS — E7800 Pure hypercholesterolemia, unspecified: Secondary | ICD-10-CM

## 2017-12-15 DIAGNOSIS — E039 Hypothyroidism, unspecified: Secondary | ICD-10-CM

## 2017-12-15 MED ORDER — NYSTATIN 100000 UNIT/GM EX OINT: 1.0000 "application " | TOPICAL_OINTMENT | Freq: Two times a day (BID) | CUTANEOUS | 1 refills | Status: DC

## 2017-12-15 MED ORDER — ATENOLOL 25 MG PO TABS: 25.0000 mg | ORAL_TABLET | Freq: Every day | ORAL | 5 refills | Status: DC

## 2017-12-15 NOTE — Assessment & Plan Note (Signed)
Tolerating statin, encouraged heart healthy diet, avoid trans fats, minimize simple carbs and saturated fats. Increase exercise as tolerated 

## 2017-12-15 NOTE — Progress Notes (Signed)
Subjective:  I acted as a Education administrator for Dr. Charlett Blake. Harry, Brown  Patient ID: Harry Brown, male    DOB: 1940/07/15, 77 y.o.   MRN: [id number]  No chief complaint on file.   HPI  Patient is in today for 3 month follow up. He is following up on his HTN, DM, hypothyroidism, hyperlipidemia and other medical concerns. He feels well today. No recent febrile illness or hospitalizations. No polyuria or polydipsia. Is maintaining  A heart healthy diet. Stays active. Sugars ranging between 120 and 160 fasting. Has had some pruritus in the testicular region but no significant rash at present. Denies CP/palp/SOB/HA/congestion/fevers/GI or GU c/o. Taking meds as prescribed  Patient Care Team: [name], MD as PCP - General (Family Medicine) Conrad [place], MD as Consulting Physician (Vascular Surgery) Calvert Cantor, MD as Consulting Physician (Ophthalmology) Malachy Chamber, DDS as Consulting Physician (Periodontics)   Past Medical History:  Diagnosis Date  . Anemia 09/24/2013  . CAD (coronary artery disease)   . Cellulitis 05/14/2013   RLE  . Diverticulitis   . ED (erectile dysfunction)   . Glaucoma 08/18/2014  . History of sleep apnea    had surgery to correct  . Hypertension   . Hypothyroidism   . Medicare annual wellness visit, subsequent 04/13/2013  . Myocardial infarction ([place]) 11/05/1985  . Nocturia 03/15/2017  . Other and unspecified hyperlipidemia   . Penile lesion 02/24/2015  . Peripheral neuropathy 03/31/2012  . Peripheral vascular disease ([place])   . Postoperative anemia due to acute blood loss 09/24/2013  . Preventative health care 03/13/2016  . Sleep apnea 09/16/2017  . Type II diabetes mellitus ([facility])    fasting 100-120  . Urinary urgency 11/22/2012    Past Surgical History:  Procedure Laterality Date  . ABDOMINAL AORTAGRAM  Oct. 9, 2014   Dr. Bridgett Larsson  . ABDOMINAL AORTAGRAM N/A 03/15/2013   Procedure: ABDOMINAL Maxcine Ham;  Surgeon: Conrad [place], MD;  Location: [facility] CATH LAB;   Service: Cardiovascular;  Laterality: N/A;  . ANTERIOR CERVICAL DECOMP/DISCECTOMY FUSION  ~ 2000  . CATARACT EXTRACTION Right   . CORONARY ARTERY BYPASS GRAFT  2001   "CABG X3" (05/14/2013)  . DENTAL SURGERY     "multiple teeth removed; trmimed top of mouth so dentures would fit" (05/14/2013)  . EYE SURGERY     cataracts  . FEMORAL-POPLITEAL BYPASS GRAFT Right 04/25/2013   Procedure: RIGHT FEMORAL TO ABOVE KNEE POPLITEAL BYPASS GRAFT WITH RIGHT GREATER SAPHENOUS VEIN HARVEST; ULTRASOUND GUIDED;  Surgeon: Conrad [place], MD;  Location: [facility] OR;  Service: Vascular;  Laterality: Right;  . GUM SURGERY  [phone]   "had bone scraped; had periodontal disease" (05/14/2013)  . PALATE / UVULA BIOPSY / EXCISION  2003   Removed due to sleep apnea  . TONSILLECTOMY      Family History  Problem Relation Age of Onset  . Hyperlipidemia Mother   . Heart disease Mother   . Diabetes Mother   . Hypertension Mother   . Heart disease Father   . Asthma Father   . Arthritis Father   . Heart disease Sister   . Heart disease Sister        s/p bypass x 2  . Lupus Sister   . Heart disease Brother        MI waiting on heart transplant when died  . Heart disease Brother        massive MI  . Cancer Neg Hx     Social  History   Socioeconomic History  . Marital status: Married    Spouse name: Not on file  . Number of children: 8   . Years of education: Not on file  . Highest education level: Not on file  Occupational History    Employer: [employer]  Social Needs  . Financial resource strain: Not on file  . Food insecurity:    Worry: Not on file    Inability: Not on file  . Transportation needs:    Medical: Not on file    Non-medical: Not on file  Tobacco Use  . Smoking status: Former Smoker    Packs/day: 1.50    Years: 50.00    Pack years: 75.00    Types: Cigarettes  . Smokeless tobacco: Never Used  . Tobacco comment: 05/14/2013 "quit smoking in ~ 2004; still Uses Comit lozenges"    Substance and Sexual Activity  . Alcohol use: No    Alcohol/week: 0.0 oz  . Drug use: No  . Sexual activity: Not Currently    Comment: lives with wife, retiring end of next week, no dietary restrictions  Lifestyle  . Physical activity:    Days per week: Not on file    Minutes per session: Not on file  . Stress: Not on file  Relationships  . Social connections:    Talks on phone: Not on file    Gets together: Not on file    Attends religious service: Not on file    Active member of club or organization: Not on file    Attends meetings of clubs or organizations: Not on file    Relationship status: Not on file  . Intimate partner violence:    Fear of current or ex partner: Not on file    Emotionally abused: Not on file    Physically abused: Not on file    Forced sexual activity: Not on file  Other Topics Concern  . 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 aspirin EC 81 MG tablet Take 81 mg by mouth at bedtime.    Marland Kitchen atorvastatin (LIPITOR) 40 MG tablet TAKE ONE (1) TABLET BY MOUTH EVERY DAY AT 6PM. 30 tablet 11  . cholecalciferol (VITAMIN D) 1000 units tablet Take 1,000 Units by mouth daily.    . finasteride (PROSCAR) 5 MG tablet TAKE ONE (1) TABLET BY MOUTH EVERY DAY 30 tablet 2  . glucose blood (ONE TOUCH ULTRA TEST) test strip USE TO CHECK BLOOD SUGAR ONCE DAILY 100 each 5  . latanoprost (XALATAN) 0.005 % ophthalmic solution Place into both eyes at bedtime.    Marland Kitchen levothyroxine (SYNTHROID, LEVOTHROID) 75 MCG tablet Take 1 tablet (75 mcg total) by mouth daily. 30 tablet 3  . loratadine (ALAVERT) 10 MG tablet Take 10 mg by mouth daily as needed for allergies.    . metFORMIN (GLUCOPHAGE-XR) 500 MG 24 hr tablet TAKE TWO (2) TABLETS BY MOUTH EVERY MORNING WITH BREAKFAST 60 tablet 2  . Multiple Vitamin (MULTIVITAMIN WITH MINERALS) TABS  tablet Take 1 tablet by mouth daily.    Glory Rosebush DELICA LANCETS 85I MISC USE TO CHECK BLOOD SUGAR ONCE DAILY 100 each 1  . tamsulosin (FLOMAX) 0.4 MG CAPS capsule TAKE ONE (1) CAPSULE EACH DAY BY MOUTH 30 capsule 2  . atenolol (TENORMIN) 25 MG tablet Take 0.5 tablets (12.5  mg total) by mouth daily. 90 tablet 3  . atenolol (TENORMIN) 25 MG tablet TAKE ONE (1) TABLET BY MOUTH EVERY DAY 30 tablet 1   No facility-administered medications prior to visit.     Allergies  Allergen Reactions  . Adhesive [Tape]   . Latex     rash  . Other Hives    Plastic shopping bags Metal staples: rash, swelling    Review of Systems  Constitutional: Negative for fever and malaise/fatigue.  HENT: Negative for congestion.   Eyes: Negative for blurred vision.  Respiratory: Negative for shortness of breath.   Cardiovascular: Negative for chest pain, palpitations and leg swelling.  Gastrointestinal: Negative for abdominal pain, blood in stool and nausea.  Genitourinary: Negative for dysuria and frequency.  Musculoskeletal: Negative for falls.  Skin: Negative for rash.  Neurological: Negative for dizziness, loss of consciousness and headaches.  Endo/Heme/Allergies: Negative for environmental allergies.  Psychiatric/Behavioral: Negative for depression. The patient is not nervous/anxious.        Objective:    Physical Exam  Constitutional: He is oriented to person, place, and time. He appears well-developed and well-nourished. No distress.  HENT:  Head: Normocephalic and atraumatic.  Nose: Nose normal.  Eyes: Right eye exhibits no discharge. Left eye exhibits no discharge.  Neck: Normal range of motion. Neck supple.  Cardiovascular: Normal rate and regular rhythm.  Pulmonary/Chest: Effort normal and breath sounds normal.  Abdominal: Soft. Bowel sounds are normal. There is no tenderness.  Musculoskeletal: He exhibits no edema.  Neurological: He is alert and oriented to person, place, and time.   Skin: Skin is warm and dry.  Psychiatric: He has a normal mood and affect.  Nursing note and vitals reviewed.   BP 132/78   Pulse 65   Temp 98.1 F (36.7 C) (Oral)   Resp 18   Wt 209 lb 6.4 oz (95 kg)   SpO2 98%   BMI 28.36 kg/m  Wt Readings from Last 3 Encounters:  12/15/17 209 lb 6.4 oz (95 kg)  09/16/17 216 lb 3.2 oz (98.1 kg)  06/13/17 217 lb 12.8 oz (98.8 kg)   BP Readings from Last 3 Encounters:  12/15/17 132/78  10/18/17 (!) 143/68  09/16/17 (!) 115/40     Immunization History  Administered Date(s) Administered  . Influenza Split 03/31/2012  . Influenza, High Dose Seasonal PF 03/20/2013, 02/24/2015, 03/11/2016, 03/15/2017  . Influenza,inj,Quad PF,6+ Mos 01/29/2014  . Pneumococcal Conjugate-13 03/20/2013  . Pneumococcal Polysaccharide-23 02/24/2015  . Td 06/07/2009    Health Maintenance  Topic Date Due  . OPHTHALMOLOGY EXAM  03/27/2015  . FOOT EXAM  03/11/2017  . URINE MICROALBUMIN  03/11/2017  . INFLUENZA VACCINE  01/05/2018  . HEMOGLOBIN A1C  06/17/2018  . TETANUS/TDAP  06/08/2019  . PNA vac Low Risk Adult  Completed    Lab Results  Component Value Date   WBC 8.3 12/15/2017   HGB 12.7 (L) 12/15/2017   HCT 39.0 12/15/2017   PLT 275.0 12/15/2017   GLUCOSE 93 12/15/2017   CHOL 130 12/15/2017   TRIG 63.0 12/15/2017   HDL 41.30 12/15/2017   LDLCALC 77 12/15/2017   ALT 19 12/15/2017   AST 19 12/15/2017   NA 140 12/15/2017   K 4.7 12/15/2017   CL 104 12/15/2017   CREATININE 0.91 12/15/2017   BUN 25 (H) 12/15/2017   CO2 27 12/15/2017   TSH 2.96 12/15/2017   PSA 0.46 03/11/2016   INR 0.99 04/20/2013   HGBA1C 7.1 (H) 12/15/2017  MICROALBUR 0.7 03/11/2016    Lab Results  Component Value Date   TSH 2.96 12/15/2017   Lab Results  Component Value Date   WBC 8.3 12/15/2017   HGB 12.7 (L) 12/15/2017   HCT 39.0 12/15/2017   MCV 91.6 12/15/2017   PLT 275.0 12/15/2017   Lab Results  Component Value Date   NA 140 12/15/2017   K 4.7  12/15/2017   CO2 27 12/15/2017   GLUCOSE 93 12/15/2017   BUN 25 (H) 12/15/2017   CREATININE 0.91 12/15/2017   BILITOT 0.7 12/15/2017   ALKPHOS 73 12/15/2017   AST 19 12/15/2017   ALT 19 12/15/2017   PROT 6.9 12/15/2017   ALBUMIN 4.0 12/15/2017   CALCIUM 9.1 12/15/2017   GFR 85.63 12/15/2017   Lab Results  Component Value Date   CHOL 130 12/15/2017   Lab Results  Component Value Date   HDL 41.30 12/15/2017   Lab Results  Component Value Date   LDLCALC 77 12/15/2017   Lab Results  Component Value Date   TRIG 63.0 12/15/2017   Lab Results  Component Value Date   CHOLHDL 3 12/15/2017   Lab Results  Component Value Date   HGBA1C 7.1 (H) 12/15/2017         Assessment & Plan:   Problem List Items Addressed This Visit    Hypothyroidism    On Levothyroxine, continue to monitor      Relevant Medications   atenolol (TENORMIN) 25 MG tablet   Other Relevant Orders   TSH (Completed)   DM (diabetes mellitus) ([place])    hgba1c acceptable, minimize simple carbs. Increase exercise as tolerated. Continue current meds      Relevant Orders   Hemoglobin A1c (Completed)   Hyperlipidemia    Tolerating statin, encouraged heart healthy diet, avoid trans fats, minimize simple carbs and saturated fats. Increase exercise as tolerated      Relevant Medications   atenolol (TENORMIN) 25 MG tablet   Other Relevant Orders   Lipid panel (Completed)   Hypertension    Well controlled, no changes to meds. Encouraged heart healthy diet such as the DASH diet and exercise as tolerated.       Relevant Medications   atenolol (TENORMIN) 25 MG tablet   Other Relevant Orders   CBC (Completed)   Comprehensive metabolic panel (Completed)      I have discontinued Gwyndolyn Saxon T. Mota's atenolol. I have also changed his atenolol. Additionally, I am having him start on nystatin ointment. Lastly, I am having him maintain his multivitamin with minerals, loratadine, aspirin EC, latanoprost,  cholecalciferol, glucose blood, ONETOUCH DELICA LANCETS 16X, tamsulosin, atorvastatin, levothyroxine, finasteride, and metFORMIN.  Meds ordered this encounter  Medications  . nystatin ointment (MYCOSTATIN)    Sig: Apply 1 application topically 2 (two) times daily.    Dispense:  30 g    Refill:  1  . atenolol (TENORMIN) 25 MG tablet    Sig: Take 1 tablet (25 mg total) by mouth daily.    Dispense:  30 tablet    Refill:  5    CMA served as scribe during this visit. History, Physical and Plan performed by medical provider. Documentation and orders reviewed and attested to.  Penni Homans, MD

## 2017-12-15 NOTE — Assessment & Plan Note (Signed)
Well controlled, no changes to meds. Encouraged heart healthy diet such as the DASH diet and exercise as tolerated.  °

## 2017-12-15 NOTE — Assessment & Plan Note (Signed)
On Levothyroxine, continue to monitor 

## 2017-12-15 NOTE — Patient Instructions (Addendum)
Shingrix 2 shots over 2-6 months at pharmacy, check price at pharmacy and then have pharmacy forward [place] proof of immunizations. sugar 70-110 fasting and after eating 100 to 150 Fasting   64 oz clear fluids daily for every cup of coffee need an extra cup of clear fluids Stand up slowly Carbohydrate Counting for Diabetes Mellitus, Adult Carbohydrate counting is a method for keeping track of how many carbohydrates you eat. Eating carbohydrates naturally increases the amount of sugar (glucose) in the blood. Counting how many carbohydrates you eat helps keep your blood glucose within normal limits, which helps you manage your diabetes (diabetes mellitus). It is important to know how many carbohydrates you can safely have in each meal. This is different for every person. A diet and nutrition specialist (registered dietitian) can help you make a meal plan and calculate how many carbohydrates you should have at each meal and snack. Carbohydrates are found in the following foods:  Grains, such as breads and cereals.  Dried beans and soy products.  Starchy vegetables, such as potatoes, peas, and corn.  Fruit and fruit juices.  Milk and yogurt.  Sweets and snack foods, such as cake, cookies, candy, chips, and soft drinks.  How do I count carbohydrates? There are two ways to count carbohydrates in food. You can use either of the methods or a combination of both. Reading "Nutrition Facts" on packaged food The "Nutrition Facts" list is included on the labels of almost all packaged foods and beverages in the [place] It includes:  The serving size.  Information about nutrients in each serving, including the grams (g) of carbohydrate per serving.  To use the "Nutrition Facts":  Decide how many servings you will have.  Multiply the number of servings by the number of carbohydrates per serving.  The resulting number is the total amount of carbohydrates that you will be having.  Learning standard  serving sizes of other foods When you eat foods containing carbohydrates that are not packaged or do not include "Nutrition Facts" on the label, you need to measure the servings in order to count the amount of carbohydrates:  Measure the foods that you will eat with a food scale or measuring cup, if needed.  Decide how many standard-size servings you will eat.  Multiply the number of servings by 15. Most carbohydrate-rich foods have about 15 g of carbohydrates per serving. ? For example, if you eat 8 oz (170 g) of strawberries, you will have eaten 2 servings and 30 g of carbohydrates (2 servings x 15 g = 30 g).  For foods that have more than one food mixed, such as soups and casseroles, you must count the carbohydrates in each food that is included.  The following list contains standard serving sizes of common carbohydrate-rich foods. Each of these servings has about 15 g of carbohydrates:   hamburger bun or  English muffin.   oz (15 mL) syrup.   oz (14 g) jelly.  1 slice of bread.  1 six-inch tortilla.  3 oz (85 g) cooked rice or pasta.  4 oz (113 g) cooked dried beans.  4 oz (113 g) starchy vegetable, such as peas, corn, or potatoes.  4 oz (113 g) hot cereal.  4 oz (113 g) mashed potatoes or  of a large baked potato.  4 oz (113 g) canned or frozen fruit.  4 oz (120 mL) fruit juice.  4-6 crackers.  6 chicken nuggets.  6 oz (170 g) unsweetened dry cereal.  6 oz (170 g) plain fat-free yogurt or yogurt sweetened with artificial sweeteners.  8 oz (240 mL) milk.  8 oz (170 g) fresh fruit or one small piece of fruit.  24 oz (680 g) popped popcorn.  Example of carbohydrate counting Sample meal  3 oz (85 g) chicken breast.  6 oz (170 g) brown rice.  4 oz (113 g) corn.  8 oz (240 mL) milk.  8 oz (170 g) strawberries with sugar-free whipped topping. Carbohydrate calculation 1. Identify the foods that contain  carbohydrates: ? Rice. ? Corn. ? Milk. ? Strawberries. 2. Calculate how many servings you have of each food: ? 2 servings rice. ? 1 serving corn. ? 1 serving milk. ? 1 serving strawberries. 3. Multiply each number of servings by 15 g: ? 2 servings rice x 15 g = 30 g. ? 1 serving corn x 15 g = 15 g. ? 1 serving milk x 15 g = 15 g. ? 1 serving strawberries x 15 g = 15 g. 4. Add together all of the amounts to find the total grams of carbohydrates eaten: ? 30 g + 15 g + 15 g + 15 g = 75 g of carbohydrates total. This information is not intended to replace advice given to you by your health care provider. Make sure you discuss any questions you have with your health care provider. Document Released: 05/24/2005 Document Revised: 12/12/2015 Document Reviewed: 11/05/2015 Elsevier Interactive Patient Education  [employer].

## 2017-12-15 NOTE — Assessment & Plan Note (Signed)
hgba1c acceptable, minimize simple carbs. Increase exercise as tolerated. Continue current meds 

## 2017-12-16 LAB — COMPREHENSIVE METABOLIC PANEL
ALT: 19 U/L (ref 0–53)
AST: 19 U/L (ref 0–37)
Albumin: 4 g/dL (ref 3.5–5.2)
Alkaline Phosphatase: 73 U/L (ref 39–117)
BUN: 25 mg/dL — ABNORMAL HIGH (ref 6–23)
CO2: 27 mEq/L (ref 19–32)
Calcium: 9.1 mg/dL (ref 8.4–10.5)
Chloride: 104 mEq/L (ref 96–112)
Creatinine, Ser: 0.91 mg/dL (ref 0.40–1.50)
GFR: 85.63 mL/min (ref >60.00)
Glucose, Bld: 93 mg/dL (ref 70–99)
Potassium: 4.7 mEq/L (ref 3.5–5.1)
Sodium: 140 mEq/L (ref 135–145)
Total Bilirubin: 0.7 mg/dL (ref 0.2–1.2)
Total Protein: 6.9 g/dL (ref 6.0–8.3)

## 2017-12-16 LAB — LIPID PANEL
Cholesterol: 130 mg/dL (ref 0–200)
HDL: 41.3 mg/dL (ref >39.00)
LDL Cholesterol: 77 mg/dL (ref 0–99)
NonHDL: 89.17
Total CHOL/HDL Ratio: 3
Triglycerides: 63 mg/dL (ref 0.0–149.0)
VLDL: 12.6 mg/dL (ref 0.0–40.0)

## 2017-12-16 LAB — HEMOGLOBIN A1C: Hgb A1c MFr Bld: 7.1 % — ABNORMAL HIGH (ref 4.6–6.5)

## 2017-12-16 LAB — CBC
HCT: 39 % (ref 39.0–52.0)
Hemoglobin: 12.7 g/dL — ABNORMAL LOW (ref 13.0–17.0)
MCHC: 32.7 g/dL (ref 30.0–36.0)
MCV: 91.6 fl (ref 78.0–100.0)
Platelets: 275 10*3/uL (ref 150.0–400.0)
RBC: 4.25 Mil/uL (ref 4.22–5.81)
RDW: 14.6 % (ref 11.5–15.5)
WBC: 8.3 10*3/uL (ref 4.0–10.5)

## 2017-12-16 LAB — TSH: TSH: 2.96 u[IU]/mL (ref 0.35–4.50)

## 2018-01-07 ENCOUNTER — Other Ambulatory Visit: Payer: Self-pay | Admitting: Family Medicine

## 2018-01-13 ENCOUNTER — Other Ambulatory Visit: Payer: Self-pay | Admitting: Family Medicine

## 2018-01-31 ENCOUNTER — Other Ambulatory Visit: Payer: Self-pay | Admitting: Family Medicine

## 2018-02-03 ENCOUNTER — Other Ambulatory Visit: Payer: Self-pay | Admitting: Family Medicine

## 2018-02-03 DIAGNOSIS — E119 Type 2 diabetes mellitus without complications: Secondary | ICD-10-CM | POA: Diagnosis not present

## 2018-02-03 DIAGNOSIS — H401211 Low-tension glaucoma, right eye, mild stage: Secondary | ICD-10-CM | POA: Diagnosis not present

## 2018-02-03 DIAGNOSIS — H04123 Dry eye syndrome of bilateral lacrimal glands: Secondary | ICD-10-CM | POA: Diagnosis not present

## 2018-03-23 ENCOUNTER — Other Ambulatory Visit: Payer: Self-pay | Admitting: Family Medicine

## 2018-04-06 ENCOUNTER — Other Ambulatory Visit: Payer: Self-pay | Admitting: Family Medicine

## 2018-05-01 ENCOUNTER — Other Ambulatory Visit: Payer: Self-pay | Admitting: *Deleted

## 2018-05-01 DIAGNOSIS — I714 Abdominal aortic aneurysm, without rupture, unspecified: Secondary | ICD-10-CM

## 2018-05-02 ENCOUNTER — Encounter: Payer: Self-pay | Admitting: Family Medicine

## 2018-05-02 ENCOUNTER — Ambulatory Visit (INDEPENDENT_AMBULATORY_CARE_PROVIDER_SITE_OTHER): Payer: Medicare Other | Admitting: Family Medicine

## 2018-05-02 VITALS — BP 122/80 | HR 57 | Temp 98.2°F | Resp 16 | Ht 72.5 in | Wt 206.0 lb

## 2018-05-02 DIAGNOSIS — N400 Enlarged prostate without lower urinary tract symptoms: Secondary | ICD-10-CM | POA: Diagnosis not present

## 2018-05-02 DIAGNOSIS — E039 Hypothyroidism, unspecified: Secondary | ICD-10-CM | POA: Diagnosis not present

## 2018-05-02 DIAGNOSIS — E7800 Pure hypercholesterolemia, unspecified: Secondary | ICD-10-CM | POA: Diagnosis not present

## 2018-05-02 DIAGNOSIS — Z23 Encounter for immunization: Secondary | ICD-10-CM | POA: Diagnosis not present

## 2018-05-02 DIAGNOSIS — E1122 Type 2 diabetes mellitus with diabetic chronic kidney disease: Secondary | ICD-10-CM

## 2018-05-02 DIAGNOSIS — Z0000 Encounter for general adult medical examination without abnormal findings: Secondary | ICD-10-CM

## 2018-05-02 DIAGNOSIS — R251 Tremor, unspecified: Secondary | ICD-10-CM | POA: Insufficient documentation

## 2018-05-02 DIAGNOSIS — G4730 Sleep apnea, unspecified: Secondary | ICD-10-CM

## 2018-05-02 DIAGNOSIS — I10 Essential (primary) hypertension: Secondary | ICD-10-CM

## 2018-05-02 LAB — COMPREHENSIVE METABOLIC PANEL
ALT: 17 U/L (ref 0–53)
AST: 15 U/L (ref 0–37)
Albumin: 3.9 g/dL (ref 3.5–5.2)
Alkaline Phosphatase: 60 U/L (ref 39–117)
BUN: 29 mg/dL — ABNORMAL HIGH (ref 6–23)
CO2: 26 mEq/L (ref 19–32)
Calcium: 9 mg/dL (ref 8.4–10.5)
Chloride: 105 mEq/L (ref 96–112)
Creatinine, Ser: 0.94 mg/dL (ref 0.40–1.50)
GFR: 82.4 mL/min (ref >60.00)
Glucose, Bld: 110 mg/dL — ABNORMAL HIGH (ref 70–99)
Potassium: 4.3 mEq/L (ref 3.5–5.1)
Sodium: 139 mEq/L (ref 135–145)
Total Bilirubin: 0.8 mg/dL (ref 0.2–1.2)
Total Protein: 6.2 g/dL (ref 6.0–8.3)

## 2018-05-02 LAB — CBC
HCT: 37.6 % — ABNORMAL LOW (ref 39.0–52.0)
Hemoglobin: 12.4 g/dL — ABNORMAL LOW (ref 13.0–17.0)
MCHC: 33.1 g/dL (ref 30.0–36.0)
MCV: 91.4 fl (ref 78.0–100.0)
Platelets: 213 10*3/uL (ref 150.0–400.0)
RBC: 4.11 Mil/uL — ABNORMAL LOW (ref 4.22–5.81)
RDW: 14.5 % (ref 11.5–15.5)
WBC: 8.3 10*3/uL (ref 4.0–10.5)

## 2018-05-02 LAB — T4, FREE: Free T4: 0.81 ng/dL (ref 0.60–1.60)

## 2018-05-02 LAB — PSA: PSA: 1.47 ng/mL (ref 0.10–4.00)

## 2018-05-02 LAB — LIPID PANEL
Cholesterol: 113 mg/dL (ref 0–200)
HDL: 37.9 mg/dL — ABNORMAL LOW (ref >39.00)
LDL Cholesterol: 66 mg/dL (ref 0–99)
NonHDL: 74.76
Total CHOL/HDL Ratio: 3
Triglycerides: 45 mg/dL (ref 0.0–149.0)
VLDL: 9 mg/dL (ref 0.0–40.0)

## 2018-05-02 LAB — HEMOGLOBIN A1C: Hgb A1c MFr Bld: 6.8 % — ABNORMAL HIGH (ref 4.6–6.5)

## 2018-05-02 LAB — TSH: TSH: 3.95 u[IU]/mL (ref 0.35–4.50)

## 2018-05-02 NOTE — Assessment & Plan Note (Signed)
Well controlled, no changes to meds. Encouraged heart healthy diet such as the DASH diet and exercise as tolerated.  °

## 2018-05-02 NOTE — Patient Instructions (Addendum)
If the tremor or the walking get worse please let us know so we can set you up with neurology for [facility] Astringent wipe down and blow dry prior to bedtime  Shingrix is the new shingles 2 shots over 2-6 months at pharmacy  Consider updating your health care power of attorney and living will and providing [place] with a copy  Consider a consultation with a dermatologist for skin surveillance Preventive Care 65 Years and Older, Male Preventive care refers to lifestyle choices and visits with your health care provider that can promote health and wellness. What does preventive care include?  A yearly physical exam. This is also called an annual well check.  Dental exams once or twice a year.  Routine eye exams. Ask your health care provider how often you should have your eyes checked.  Personal lifestyle choices, including: ? Daily care of your teeth and gums. ? Regular physical activity. ? Eating a healthy diet. ? Avoiding tobacco and drug use. ? Limiting alcohol use. ? Practicing safe sex. ? Taking low doses of aspirin every day. ? Taking vitamin and mineral supplements as recommended by your health care provider. What happens during an annual well check? The services and screenings done by your health care provider during your annual well check will depend on your age, overall health, lifestyle risk factors, and family history of disease. Counseling Your health care provider may ask you questions about your:  Alcohol use.  Tobacco use.  Drug use.  Emotional well-being.  Home and relationship well-being.  Sexual activity.  Eating habits.  History of falls.  Memory and ability to understand (cognition).  Work and work Statistician.  Screening You may have the following tests or measurements:  Height, weight, and BMI.  Blood pressure.  Lipid and cholesterol levels. These may be checked every 5 years, or more frequently if you are over 5 years  old.  Skin check.  Lung cancer screening. You may have this screening every year starting at age 41 if you have a 30-pack-year history of smoking and currently smoke or have quit within the past 15 years.  Fecal occult blood test (FOBT) of the stool. You may have this test every year starting at age 34.  Flexible sigmoidoscopy or colonoscopy. You may have a sigmoidoscopy every 5 years or a colonoscopy every 10 years starting at age 81.  Prostate cancer screening. Recommendations will vary depending on your family history and other risks.  Hepatitis C blood test.  Hepatitis B blood test.  Sexually transmitted disease (STD) testing.  Diabetes screening. This is done by checking your blood sugar (glucose) after you have not eaten for a while (fasting). You may have this done every 1-3 years.  Abdominal aortic aneurysm (AAA) screening. You may need this if you are a current or former smoker.  Osteoporosis. You may be screened starting at age 56 if you are at high risk.  Talk with your health care provider about your test results, treatment options, and if necessary, the need for more tests. Vaccines Your health care provider may recommend certain vaccines, such as:  Influenza vaccine. This is recommended every year.  Tetanus, diphtheria, and acellular pertussis (Tdap, Td) vaccine. You may need a Td booster every 10 years.  Varicella vaccine. You may need this if you have not been vaccinated.  Zoster vaccine. You may need this after age 41.  Measles, mumps, and rubella (MMR) vaccine. You may need at least one dose of MMR  if you were born in 1957 or later. You may also need a second dose.  Pneumococcal 13-valent conjugate (PCV13) vaccine. One dose is recommended after age 63.  Pneumococcal polysaccharide (PPSV23) vaccine. One dose is recommended after age 55.  Meningococcal vaccine. You may need this if you have certain conditions.  Hepatitis A vaccine. You may need this if you  have certain conditions or if you travel or work in places where you may be exposed to hepatitis A.  Hepatitis B vaccine. You may need this if you have certain conditions or if you travel or work in places where you may be exposed to hepatitis B.  Haemophilus influenzae type b (Hib) vaccine. You may need this if you have certain risk factors.  Talk to your health care provider about which screenings and vaccines you need and how often you need them. This information is not intended to replace advice given to you by your health care provider. Make sure you discuss any questions you have with your health care provider. Document Released: 06/20/2015 Document Revised: 02/11/2016 Document Reviewed: 03/25/2015 Elsevier Interactive Patient Education  [employer].

## 2018-05-02 NOTE — Assessment & Plan Note (Signed)
hgba1c acceptable, minimize simple carbs. Increase exercise as tolerated. Continue current meds 

## 2018-05-02 NOTE — Assessment & Plan Note (Signed)
Tolerating statin, encouraged heart healthy diet, avoid trans fats, minimize simple carbs and saturated fats. Increase exercise as tolerated 

## 2018-05-02 NOTE — Progress Notes (Signed)
HPI: FU AAA and coronary artery disease. Had coronary artery bypassing graft in July of 2001 with a LIMA to the LAD, RIMA to the circumflex and saphenous vein graft to the PDA. Nuclear study in August of 2013 showed an ejection fraction of 57% and normal perfusion. Patient has known peripheral vascular disease followed by vascular surgery.  Normal ultrasound 12/19 showed 3.7 cm abdominal aortic aneurysm.   Since last seen the patient denies any dyspnea on exertion, orthopnea, PND, pedal edema, palpitations, syncope or chest pain.   Current Outpatient Medications  Medication Sig Dispense Refill  . aspirin EC 81 MG tablet Take 81 mg by mouth at bedtime.    Marland Kitchen atenolol (TENORMIN) 25 MG tablet Take 1 tablet (25 mg total) by mouth daily. 30 tablet 5  . atorvastatin (LIPITOR) 40 MG tablet TAKE ONE (1) TABLET BY MOUTH EVERY DAY AT 6PM. 30 tablet 11  . cholecalciferol (VITAMIN D) 1000 units tablet Take 1,000 Units by mouth daily.    . finasteride (PROSCAR) 5 MG tablet TAKE ONE (1) TABLET BY MOUTH EVERY DAY 30 tablet 2  . glucose blood (ONE TOUCH ULTRA TEST) test strip USE ONCE A DAY 100 each 12  . latanoprost (XALATAN) 0.005 % ophthalmic solution Place into both eyes at bedtime.    Marland Kitchen loratadine (ALAVERT) 10 MG tablet Take 10 mg by mouth daily as needed for allergies.    . metFORMIN (GLUCOPHAGE-XR) 500 MG 24 hr tablet TAKE TWO TABLETS BY MOUTH EVERY MORNING WITH BREAKFAST 60 tablet 2  . Multiple Vitamin (MULTIVITAMIN WITH MINERALS) TABS tablet Take 1 tablet by mouth daily.    Glory Rosebush DELICA LANCETS 74F MISC USE TO CHECK BLOOD SUGAR ONCE DAILY 100 each 1  . tamsulosin (FLOMAX) 0.4 MG CAPS capsule TAKE ONE CAPSULE BY MOUTH DAILY 30 capsule 2   No current facility-administered medications for this visit.      Past Medical History:  Diagnosis Date  . Anemia 09/24/2013  . CAD (coronary artery disease)   . Cellulitis 05/14/2013   RLE  . Diverticulitis   . ED (erectile dysfunction)   .  Glaucoma 08/18/2014  . History of sleep apnea    had surgery to correct  . Hypertension   . Hypothyroidism   . Medicare annual wellness visit, subsequent 04/13/2013  . Myocardial infarction ([place]) 11/05/1985  . Nocturia 03/15/2017  . Other and unspecified hyperlipidemia   . Penile lesion 02/24/2015  . Peripheral neuropathy 03/31/2012  . Peripheral vascular disease ([place])   . Postoperative anemia due to acute blood loss 09/24/2013  . Preventative health care 03/13/2016  . Sleep apnea 09/16/2017  . Type II diabetes mellitus (HCC)    fasting 100-120  . Urinary urgency 11/22/2012    Past Surgical History:  Procedure Laterality Date  . ABDOMINAL AORTAGRAM  Oct. 9, 2014   Dr. Bridgett Larsson  . ABDOMINAL AORTAGRAM N/A 03/15/2013   Procedure: ABDOMINAL Maxcine Ham;  Surgeon: Conrad [place], MD;  Location: [facility] CATH LAB;  Service: Cardiovascular;  Laterality: N/A;  . ANTERIOR CERVICAL DECOMP/DISCECTOMY FUSION  ~ 2000  . CATARACT EXTRACTION Right   . CORONARY ARTERY BYPASS GRAFT  2001   "CABG X3" (05/14/2013)  . DENTAL SURGERY     "multiple teeth removed; trmimed top of mouth so dentures would fit" (05/14/2013)  . EYE SURGERY     cataracts  . FEMORAL-POPLITEAL BYPASS GRAFT Right 04/25/2013   Procedure: RIGHT FEMORAL TO ABOVE KNEE POPLITEAL BYPASS GRAFT WITH RIGHT GREATER SAPHENOUS VEIN  HARVEST; ULTRASOUND GUIDED;  Surgeon: Conrad Wood-Ridge, MD;  Location: [facility] OR;  Service: Vascular;  Laterality: Right;  . GUM SURGERY  [phone]   "had bone scraped; had periodontal disease" (05/14/2013)  . PALATE / UVULA BIOPSY / EXCISION  2003   Removed due to sleep apnea  . TONSILLECTOMY      Social History   Socioeconomic History  . Marital status: Married    Spouse name: Not on file  . Number of children: 8   . Years of education: Not on file  . Highest education level: Not on file  Occupational History    Employer: [employer]  Social Needs  . Financial resource strain: Not on file  . Food insecurity:     Worry: Not on file    Inability: Not on file  . Transportation needs:    Medical: Not on file    Non-medical: Not on file  Tobacco Use  . Smoking status: Former Smoker    Packs/day: 1.50    Years: 50.00    Pack years: 75.00    Types: Cigarettes  . Smokeless tobacco: Never Used  . Tobacco comment: 05/14/2013 "quit smoking in ~ 2004; still Uses Comit lozenges"  Substance and Sexual Activity  . Alcohol use: No    Alcohol/week: 0.0 standard drinks  . Drug use: No  . Sexual activity: Yes    Comment: lives with wife, retiring end of next week, no dietary restrictions  Lifestyle  . Physical activity:    Days per week: Not on file    Minutes per session: Not on file  . Stress: Not on file  Relationships  . Social connections:    Talks on phone: Not on file    Gets together: Not on file    Attends religious service: Not on file    Active member of club or organization: Not on file    Attends meetings of clubs or organizations: Not on file    Relationship status: Not on file  . Intimate partner violence:    Fear of current or ex partner: Not on file    Emotionally abused: Not on file    Physically abused: Not on file    Forced sexual activity: Not on file  Other Topics Concern  . 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 Hyperlipidemia Mother   . Heart disease Mother   . Diabetes Mother   . Hypertension Mother   . Heart disease Father   . Asthma Father   . Arthritis Father   . Heart disease Sister   . Heart disease Sister        s/p bypass x 2  . Lupus Sister   . Heart disease Brother        MI waiting on heart transplant when died  . Heart disease Brother        massive MI  . Cancer Neg Hx     ROS: no fevers or chills, productive cough, hemoptysis, dysphasia, odynophagia, melena, hematochezia, dysuria, hematuria, rash, seizure activity,  orthopnea, PND, pedal edema, claudication. Remaining systems are negative.  Physical Exam: Well-developed well-nourished in no acute distress.  Skin is warm and dry.  HEENT is normal.  Neck is supple.  Chest is clear to auscultation with normal expansion.  Cardiovascular exam is regular rate and rhythm.  Abdominal  exam nontender or distended. No masses palpated. Extremities show no edema. neuro grossly intact  ECG-sinus rhythm with ventricular bigeminy, normal axis, no ST changes.  Personally reviewed  A/P  1 coronary artery disease status post coronary artery bypass graft-patient doing well from a symptomatic standpoint with no chest pain.  Plan to continue medical therapy with aspirin and statin.  2 abdominal aortic aneurysm-he will need follow-up abdominal ultrasound December 2020.  3 hypertension-patient's blood pressure is controlled.  Continue present medications and follow.  4 hyperlipidemia-continue statin.  Lipids from November 2019 personally reviewed.  Total cholesterol 113 with LDL 66.  Liver functions normal.  Kirk Ruths, MD

## 2018-05-02 NOTE — Assessment & Plan Note (Signed)
Does not wear a mask has not needed it since tonsils removed and uvulectomy. Snoring and sleep have been much better.

## 2018-05-02 NOTE — Assessment & Plan Note (Signed)
Check psa

## 2018-05-02 NOTE — Assessment & Plan Note (Signed)
On Levothyroxine, continue to monitor 

## 2018-05-02 NOTE — Assessment & Plan Note (Signed)
Patient encouraged to maintain heart healthy diet, regular exercise, adequate sleep. Consider daily probiotics. Take medications as prescribed 

## 2018-05-02 NOTE — Assessment & Plan Note (Signed)
Right hand with a bit of a shuffling gait. Tremor only happens once to twice a week for 1-5 minutes consider neurology consult if worsens.

## 2018-05-08 NOTE — Progress Notes (Addendum)
Subjective:   Harry Brown is a 77 y.o. male who presents for Medicare Annual/Subsequent preventive examination.  Review of Systems: No ROS.  Medicare Wellness Visit. Additional risk factors are reflected in the social history. Cardiac Risk Factors include: advanced age (>66men, >52 women);diabetes mellitus;dyslipidemia;hypertension;male gender Sleep patterns: no issues.  Home Safety/Smoke Alarms: Feels safe in home. Smoke alarms in place. Lives with wife in 1 story home.  Eye-every 3 months. Dr.Fontaine.   Male:   CCS- Pt unsure of last colonoscopy. Will discuss with PCP at next visit.  PSA-  Lab Results  Component Value Date   PSA 1.47 05/02/2018   PSA 0.46 03/11/2016   PSA 0.58 11/20/2012       Objective:    Vitals: BP (!) 142/82 (BP Location: Left Arm, Patient Position: Sitting, Cuff Size: Normal)   Pulse 65   Ht 6' (1.829 m)   Wt 208 lb 12.8 oz (94.7 kg)   SpO2 96%   BMI 28.32 kg/m   Body mass index is 28.32 kg/m.  Advanced Directives 05/09/2018 09/13/2016 03/11/2016 04/11/2015 10/04/2014 04/05/2014 02/03/2014  Does Patient Have a Medical Advance Directive? No Yes Yes No No No No  Type of Advance Directive - [place];Living will Living will - - - -  Copy of [place] in Chart? - No - copy requested No - copy requested - - - -  Would patient like information on creating a medical advance directive? Yes (MAU/Ambulatory/Procedural Areas - Information given) - - Yes - Educational materials given Yes - Scientist, clinical (histocompatibility and immunogenetics) given Yes - Scientist, clinical (histocompatibility and immunogenetics) given -  Pre-existing out of facility DNR order (yellow form or pink MOST form) - - - - - - -    Tobacco Social History   Tobacco Use  Smoking Status Former Smoker  . Packs/day: 1.50  . Years: 50.00  . Pack years: 75.00  . Types: Cigarettes  Smokeless Tobacco Never Used  Tobacco Comment   05/14/2013 "quit smoking in ~ 2004; still Uses Comit lozenges"     Counseling given:  Not Answered Comment: 05/14/2013 "quit smoking in ~ 2004; still Uses Comit lozenges"   Clinical Intake:     Pain : No/denies pain                 Past Medical History:  Diagnosis Date  . Anemia 09/24/2013  . CAD (coronary artery disease)   . Cellulitis 05/14/2013   RLE  . Diverticulitis   . ED (erectile dysfunction)   . Glaucoma 08/18/2014  . History of sleep apnea    had surgery to correct  . Hypertension   . Hypothyroidism   . Medicare annual wellness visit, subsequent 04/13/2013  . Myocardial infarction ([place]) 11/05/1985  . Nocturia 03/15/2017  . Other and unspecified hyperlipidemia   . Penile lesion 02/24/2015  . Peripheral neuropathy 03/31/2012  . Peripheral vascular disease ([place])   . Postoperative anemia due to acute blood loss 09/24/2013  . Preventative health care 03/13/2016  . Sleep apnea 09/16/2017  . Type II diabetes mellitus (HCC)    fasting 100-120  . Urinary urgency 11/22/2012   Past Surgical History:  Procedure Laterality Date  . ABDOMINAL AORTAGRAM  Oct. 9, 2014   Dr. Bridgett Larsson  . ABDOMINAL AORTAGRAM N/A 03/15/2013   Procedure: ABDOMINAL Maxcine Ham;  Surgeon: Conrad [place], MD;  Location: [facility] CATH LAB;  Service: Cardiovascular;  Laterality: N/A;  . ANTERIOR CERVICAL DECOMP/DISCECTOMY FUSION  ~ 2000  . CATARACT EXTRACTION Right   .  CORONARY ARTERY BYPASS GRAFT  2001   "CABG X3" (05/14/2013)  . DENTAL SURGERY     "multiple teeth removed; trmimed top of mouth so dentures would fit" (05/14/2013)  . EYE SURGERY     cataracts  . FEMORAL-POPLITEAL BYPASS GRAFT Right 04/25/2013   Procedure: RIGHT FEMORAL TO ABOVE KNEE POPLITEAL BYPASS GRAFT WITH RIGHT GREATER SAPHENOUS VEIN HARVEST; ULTRASOUND GUIDED;  Surgeon: Conrad [place], MD;  Location: [facility] OR;  Service: Vascular;  Laterality: Right;  . GUM SURGERY  [phone]   "had bone scraped; had periodontal disease" (05/14/2013)  . PALATE / UVULA BIOPSY / EXCISION  2003   Removed due to sleep apnea  . TONSILLECTOMY     Family  History  Problem Relation Age of Onset  . Hyperlipidemia Mother   . Heart disease Mother   . Diabetes Mother   . Hypertension Mother   . Heart disease Father   . Asthma Father   . Arthritis Father   . Heart disease Sister   . Heart disease Sister        s/p bypass x 2  . Lupus Sister   . Heart disease Brother        MI waiting on heart transplant when died  . Heart disease Brother        massive MI  . Cancer Neg Hx    Social History   Socioeconomic History  . Marital status: Married    Spouse name: Not on file  . Number of children: 8   . Years of education: Not on file  . Highest education level: Not on file  Occupational History    Employer: [employer]  Social Needs  . Financial resource strain: Not on file  . Food insecurity:    Worry: Not on file    Inability: Not on file  . Transportation needs:    Medical: Not on file    Non-medical: Not on file  Tobacco Use  . Smoking status: Former Smoker    Packs/day: 1.50    Years: 50.00    Pack years: 75.00    Types: Cigarettes  . Smokeless tobacco: Never Used  . Tobacco comment: 05/14/2013 "quit smoking in ~ 2004; still Uses Comit lozenges"  Substance and Sexual Activity  . Alcohol use: No    Alcohol/week: 0.0 standard drinks  . Drug use: No  . Sexual activity: Yes    Comment: lives with wife, retiring end of next week, no dietary restrictions  Lifestyle  . Physical activity:    Days per week: Not on file    Minutes per session: Not on file  . Stress: Not on file  Relationships  . Social connections:    Talks on phone: Not on file    Gets together: Not on file    Attends religious service: Not on file    Active member of club or organization: Not on file    Attends meetings of clubs or organizations: Not on file    Relationship status: Not on file  Other Topics Concern  . 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 aspirin EC 81 MG tablet Take 81 mg by mouth at bedtime.  Marland Kitchen atenolol (TENORMIN) 25 MG tablet Take 1 tablet (25 mg total) by mouth daily.  Marland Kitchen atorvastatin (LIPITOR) 40 MG tablet TAKE ONE (1)  TABLET BY MOUTH EVERY DAY AT 6PM.  . cholecalciferol (VITAMIN D) 1000 units tablet Take 1,000 Units by mouth daily.  . finasteride (PROSCAR) 5 MG tablet TAKE ONE (1) TABLET BY MOUTH EVERY DAY  . glucose blood (ONE TOUCH ULTRA TEST) test strip USE ONCE A DAY  . latanoprost (XALATAN) 0.005 % ophthalmic solution Place into both eyes at bedtime.  Marland Kitchen loratadine (ALAVERT) 10 MG tablet Take 10 mg by mouth daily as needed for allergies.  . metFORMIN (GLUCOPHAGE-XR) 500 MG 24 hr tablet TAKE TWO TABLETS BY MOUTH EVERY MORNING WITH BREAKFAST  . Multiple Vitamin (MULTIVITAMIN WITH MINERALS) TABS tablet Take 1 tablet by mouth daily.  Glory Rosebush DELICA LANCETS 46E MISC USE TO CHECK BLOOD SUGAR ONCE DAILY  . tamsulosin (FLOMAX) 0.4 MG CAPS capsule TAKE ONE CAPSULE BY MOUTH DAILY   No facility-administered encounter medications on file as of 05/09/2018.     Activities of Daily Living In your present state of health, do you have any difficulty performing the following activities: 05/09/2018  Hearing? N  Vision? N  Difficulty concentrating or making decisions? N  Comment Does crossword puzzles daily.  Teaches Sunday School.  Walking or climbing stairs? N  Dressing or bathing? N  Doing errands, shopping? N  Preparing Food and eating ? N  Using the Toilet? N  In the past six months, have you accidently leaked urine? N  Do you have problems with loss of bowel control? N  Managing your Medications? N  Managing your Finances? N  Housekeeping or managing your Housekeeping? N  Some recent data might be hidden    Patient Care Team: [name], MD as PCP - General (Family Medicine) Conrad Sims, MD as Consulting Physician (Vascular  Surgery) Calvert Cantor, MD as Consulting Physician (Ophthalmology) Malachy Chamber, DDS as Consulting Physician (Periodontics)   Assessment:   This is a routine wellness examination for [place]. Physical assessment deferred to PCP.  Exercise Activities and Dietary recommendations Current Exercise Habits: The patient does not participate in regular exercise at present, Exercise limited by: None identified Diet (meal preparation, eat out, water intake, caffeinated beverages, dairy products, fruits and vegetables): in general, a "healthy" diet  , well balanced, on average, 3 meals per day   Goals    . Healthy Lifestyle     Continue to eat heart healthy diet (full of fruits, vegetables, whole grains, lean protein, water--limit salt, fat, and sugar intake) and increase physical activity as tolerated. Continue doing brain stimulating activities (puzzles, reading, adult coloring books, staying active) to keep memory sharp.         Fall Risk Fall Risk  05/09/2018 09/13/2016 03/11/2016 02/24/2015 01/29/2014  Falls in the past year? 0 Yes No Yes No  Number falls in past yr: - 1 - 1 -  Injury with Fall? - No - No -  Follow up - Falls prevention discussed - - -    Depression Screen PHQ 2/9 Scores 05/09/2018 09/13/2016 03/11/2016 02/24/2015  PHQ - 2 Score 0 0 0 0   Ad8 score reviewed for issues:  Issues making decisions:no  Less interest in hobbies / activities:no  Repeats questions, stories (family complaining):no  Trouble using ordinary gadgets (microwave, computer, phone):no  Forgets the month or year: no  Mismanaging finances: no  Remembering appts:no  Daily problems with thinking and/or memory:no Ad8 score is=0   Cognitive Function MMSE - Mini Mental State Exam 09/13/2016 03/11/2016  Orientation to time 5 5  Orientation to Place 5  5  Registration 3 3  Attention/ Calculation 4 5  Recall 2 3  Language- name 2 objects 2 2  Language- repeat 1 1  Language- follow 3 step command 3 3   Language- read & follow direction 1 1  Write a sentence 1 1  Copy design 1 1  Total score 28 30        Immunization History  Administered Date(s) Administered  . Influenza Split 03/31/2012  . Influenza, High Dose Seasonal PF 03/20/2013, 02/24/2015, 03/11/2016, 03/15/2017, 05/02/2018  . Influenza,inj,Quad PF,6+ Mos 01/29/2014  . Pneumococcal Conjugate-13 03/20/2013  . Pneumococcal Polysaccharide-23 02/24/2015  . Td 06/07/2009    Screening Tests Health Maintenance  Topic Date Due  . FOOT EXAM  03/11/2017  . URINE MICROALBUMIN  03/11/2017  . HEMOGLOBIN A1C  10/31/2018  . OPHTHALMOLOGY EXAM  02/08/2019  . TETANUS/TDAP  06/08/2019  . INFLUENZA VACCINE  Completed  . PNA vac Low Risk Adult  Completed      Plan:    Please schedule your next medicare wellness visit with me in 1 yr.  Bring a copy of your living will and/or healthcare power of attorney to your next office visit.    I have personally reviewed and noted the following in the patient's chart:   . Medical and social history . Use of alcohol, tobacco or illicit drugs  . Current medications and supplements . Functional ability and status . Nutritional status . Physical activity . Advanced directives . List of other physicians . Hospitalizations, surgeries, and ER visits in previous 12 months . Vitals . Screenings to include cognitive, depression, and falls . Referrals and appointments  In addition, I have reviewed and discussed with patient certain preventive protocols, quality metrics, and best practice recommendations. A written personalized care plan for preventive services as well as general preventive health recommendations were provided to patient.     Shela Nevin, [place]  05/09/2018  Medical screening examination/treatment was performed by qualified clinical staff member and as supervising physician I was immediately available for consultation/collaboration. I have reviewed documentation and  agree with assessment and plan.  Penni Homans, MD

## 2018-05-08 NOTE — Progress Notes (Signed)
Subjective:    Patient ID: Harry Brown, male    DOB: 29-Aug-1940, 77 y.o.   MRN: [id number]  Chief Complaint  Patient presents with  . Annual Exam    flu shot    HPI Patient is in today for annual preventative exam and follow-up on chronic medical concerns including diabetes, hyperlipidemia and hypertension.  He reports generally doing well.  Is noting recent trouble with a tremor in his right hand especially when he tries to work on his computer.  Lasts a couple of minutes and then resolves.  Is not limiting his activity thus far.  Also acknowledges his family reports his gait is a little more shuffling and he does stumble at times.  No fall or injury.  Declines referral at this time but will let us know if it worsens.  Continues to struggle with pruritus and skin irritation cream prescribed was not helpful.  Blood sugars have been running 100-120.  No polyuria or polydipsia.  He is doing well with activities of daily living.  Tries to maintain a heart healthy diet and stay active. Denies CP/palp/SOB/HA/congestion/fevers/GI or GU c/o. Taking meds as prescribed  Past Medical History:  Diagnosis Date  . Anemia 09/24/2013  . CAD (coronary artery disease)   . Cellulitis 05/14/2013   RLE  . Diverticulitis   . ED (erectile dysfunction)   . Glaucoma 08/18/2014  . History of sleep apnea    had surgery to correct  . Hypertension   . Hypothyroidism   . Medicare annual wellness visit, subsequent 04/13/2013  . Myocardial infarction ([place]) 11/05/1985  . Nocturia 03/15/2017  . Other and unspecified hyperlipidemia   . Penile lesion 02/24/2015  . Peripheral neuropathy 03/31/2012  . Peripheral vascular disease ([place])   . Postoperative anemia due to acute blood loss 09/24/2013  . Preventative health care 03/13/2016  . Sleep apnea 09/16/2017  . Type II diabetes mellitus ([facility])    fasting 100-120  . Urinary urgency 11/22/2012    Past Surgical History:  Procedure Laterality Date  . ABDOMINAL AORTAGRAM   Oct. 9, 2014   Dr. Bridgett Larsson  . ABDOMINAL AORTAGRAM N/A 03/15/2013   Procedure: ABDOMINAL Maxcine Ham;  Surgeon: Conrad Rockford, MD;  Location: [facility] CATH LAB;  Service: Cardiovascular;  Laterality: N/A;  . ANTERIOR CERVICAL DECOMP/DISCECTOMY FUSION  ~ 2000  . CATARACT EXTRACTION Right   . CORONARY ARTERY BYPASS GRAFT  2001   "CABG X3" (05/14/2013)  . DENTAL SURGERY     "multiple teeth removed; trmimed top of mouth so dentures would fit" (05/14/2013)  . EYE SURGERY     cataracts  . FEMORAL-POPLITEAL BYPASS GRAFT Right 04/25/2013   Procedure: RIGHT FEMORAL TO ABOVE KNEE POPLITEAL BYPASS GRAFT WITH RIGHT GREATER SAPHENOUS VEIN HARVEST; ULTRASOUND GUIDED;  Surgeon: Conrad Challis, MD;  Location: [facility] OR;  Service: Vascular;  Laterality: Right;  . GUM SURGERY  [phone]   "had bone scraped; had periodontal disease" (05/14/2013)  . PALATE / UVULA BIOPSY / EXCISION  2003   Removed due to sleep apnea  . TONSILLECTOMY      Family History  Problem Relation Age of Onset  . Hyperlipidemia Mother   . Heart disease Mother   . Diabetes Mother   . Hypertension Mother   . Heart disease Father   . Asthma Father   . Arthritis Father   . Heart disease Sister   . Heart disease Sister        s/p bypass x 2  . Lupus Sister   .  Heart disease Brother        MI waiting on heart transplant when died  . Heart disease Brother        massive MI  . Cancer Neg Hx     Social History   Socioeconomic History  . Marital status: Married    Spouse name: Not on file  . Number of children: 8   . Years of education: Not on file  . Highest education level: Not on file  Occupational History    Employer: [employer]  Social Needs  . Financial resource strain: Not on file  . Food insecurity:    Worry: Not on file    Inability: Not on file  . Transportation needs:    Medical: Not on file    Non-medical: Not on file  Tobacco Use  . Smoking status: Former Smoker    Packs/day: 1.50    Years: 50.00    Pack  years: 75.00    Types: Cigarettes  . Smokeless tobacco: Never Used  . Tobacco comment: 05/14/2013 "quit smoking in ~ 2004; still Uses Comit lozenges"  Substance and Sexual Activity  . Alcohol use: No    Alcohol/week: 0.0 standard drinks  . Drug use: No  . Sexual activity: Not Currently    Comment: lives with wife, retiring end of next week, no dietary restrictions  Lifestyle  . Physical activity:    Days per week: Not on file    Minutes per session: Not on file  . Stress: Not on file  Relationships  . Social connections:    Talks on phone: Not on file    Gets together: Not on file    Attends religious service: Not on file    Active member of club or organization: Not on file    Attends meetings of clubs or organizations: Not on file    Relationship status: Not on file  . Intimate partner violence:    Fear of current or ex partner: Not on file    Emotionally abused: Not on file    Physically abused: Not on file    Forced sexual activity: Not on file  Other Topics Concern  . 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 aspirin EC 81 MG tablet Take 81 mg by mouth at bedtime.    Marland Kitchen atenolol (TENORMIN) 25 MG tablet Take 1 tablet (25 mg total) by mouth daily. 30 tablet 5  . atorvastatin (LIPITOR) 40 MG tablet TAKE ONE (1) TABLET BY MOUTH EVERY DAY AT 6PM. 30 tablet 11  . cholecalciferol (VITAMIN D) 1000 units tablet Take 1,000 Units by mouth daily.    . finasteride (PROSCAR) 5 MG tablet TAKE ONE (1) TABLET BY MOUTH EVERY DAY 30 tablet 2  . glucose blood (ONE TOUCH ULTRA TEST) test strip USE ONCE A DAY 100 each 12  . latanoprost (XALATAN) 0.005 % ophthalmic solution Place into both eyes at bedtime.    Marland Kitchen loratadine (ALAVERT) 10 MG tablet Take 10 mg by mouth daily as needed for allergies.    . metFORMIN (GLUCOPHAGE-XR) 500 MG 24 hr tablet TAKE  TWO TABLETS BY MOUTH EVERY MORNING WITH BREAKFAST 60 tablet 2  . Multiple Vitamin (MULTIVITAMIN WITH MINERALS) TABS tablet Take 1 tablet by mouth daily.    Glory Rosebush DELICA LANCETS 32K MISC USE TO CHECK BLOOD  SUGAR ONCE DAILY 100 each 1  . tamsulosin (FLOMAX) 0.4 MG CAPS capsule TAKE ONE CAPSULE BY MOUTH DAILY 30 capsule 2  . levothyroxine (SYNTHROID, LEVOTHROID) 75 MCG tablet TAKE ONE (1) TABLET BY MOUTH EACH DAY 90 tablet 1  . nystatin ointment (MYCOSTATIN) Apply 1 application topically 2 (two) times daily. 30 g 1   No facility-administered medications prior to visit.     Allergies  Allergen Reactions  . Adhesive [Tape]   . Latex     rash  . Other Hives    Plastic shopping bags Metal staples: rash, swelling    Review of Systems  Constitutional: Negative for chills, fever and malaise/fatigue.  HENT: Negative for congestion and hearing loss.   Eyes: Negative for discharge.  Respiratory: Negative for cough, sputum production and shortness of breath.   Cardiovascular: Negative for chest pain, palpitations and leg swelling.  Gastrointestinal: Negative for abdominal pain, blood in stool, constipation, diarrhea, heartburn, nausea and vomiting.  Genitourinary: Negative for dysuria, frequency, hematuria and urgency.  Musculoskeletal: Negative for back pain, falls and myalgias.  Skin: Positive for itching. Negative for rash.  Neurological: Positive for tremors. Negative for dizziness, sensory change, loss of consciousness, weakness and headaches.  Endo/Heme/Allergies: Negative for environmental allergies. Does not bruise/bleed easily.  Psychiatric/Behavioral: Negative for depression and suicidal ideas. The patient is not nervous/anxious and does not have insomnia.        Objective:    Physical Exam  Constitutional: He is oriented to person, place, and time. He appears well-developed and well-nourished. No distress.  HENT:  Head: Normocephalic and atraumatic.  Right Ear: External  ear normal.  Left Ear: External ear normal.  Nose: Nose normal.  Eyes: Pupils are equal, round, and reactive to light. Conjunctivae and EOM are normal. Right eye exhibits no discharge. Left eye exhibits no discharge.  Neck: Normal range of motion. Neck supple.  Cardiovascular: Normal rate and regular rhythm.  No murmur heard. Pulmonary/Chest: Effort normal and breath sounds normal. No respiratory distress. He has no wheezes.  Abdominal: Soft. Bowel sounds are normal. He exhibits no distension and no mass. There is no tenderness. There is no guarding.  Musculoskeletal: He exhibits no edema.  Neurological: He is alert and oriented to person, place, and time. He displays normal reflexes. No cranial nerve deficit or sensory deficit. He exhibits normal muscle tone. Coordination normal.  Skin: Skin is warm and dry.  Psychiatric: He has a normal mood and affect.  Nursing note and vitals reviewed.   BP 122/80 (BP Location: Left Arm, Patient Position: Sitting, Cuff Size: Large)   Pulse (!) 57   Temp 98.2 F (36.8 C) (Oral)   Resp 16   Ht 6' 0.5" (1.842 m)   Wt 206 lb (93.4 kg)   SpO2 98%   BMI 27.55 kg/m  Wt Readings from Last 3 Encounters:  05/02/18 206 lb (93.4 kg)  12/15/17 209 lb 6.4 oz (95 kg)  09/16/17 216 lb 3.2 oz (98.1 kg)     Lab Results  Component Value Date   WBC 8.3 05/02/2018   HGB 12.4 (L) 05/02/2018   HCT 37.6 (L) 05/02/2018   PLT 213.0 05/02/2018   GLUCOSE 110 (H) 05/02/2018   CHOL 113 05/02/2018   TRIG 45.0 05/02/2018   HDL 37.90 (L) 05/02/2018   LDLCALC 66 05/02/2018   ALT 17 05/02/2018   AST 15 05/02/2018   NA 139 05/02/2018   K 4.3 05/02/2018   CL 105 05/02/2018   CREATININE 0.94 05/02/2018  BUN 29 (H) 05/02/2018   CO2 26 05/02/2018   TSH 3.95 05/02/2018   PSA 1.47 05/02/2018   INR 0.99 04/20/2013   HGBA1C 6.8 (H) 05/02/2018   MICROALBUR 0.7 03/11/2016    Lab Results  Component Value Date   TSH 3.95 05/02/2018   Lab Results  Component Value  Date   WBC 8.3 05/02/2018   HGB 12.4 (L) 05/02/2018   HCT 37.6 (L) 05/02/2018   MCV 91.4 05/02/2018   PLT 213.0 05/02/2018   Lab Results  Component Value Date   NA 139 05/02/2018   K 4.3 05/02/2018   CO2 26 05/02/2018   GLUCOSE 110 (H) 05/02/2018   BUN 29 (H) 05/02/2018   CREATININE 0.94 05/02/2018   BILITOT 0.8 05/02/2018   ALKPHOS 60 05/02/2018   AST 15 05/02/2018   ALT 17 05/02/2018   PROT 6.2 05/02/2018   ALBUMIN 3.9 05/02/2018   CALCIUM 9.0 05/02/2018   GFR 82.40 05/02/2018   Lab Results  Component Value Date   CHOL 113 05/02/2018   Lab Results  Component Value Date   HDL 37.90 (L) 05/02/2018   Lab Results  Component Value Date   LDLCALC 66 05/02/2018   Lab Results  Component Value Date   TRIG 45.0 05/02/2018   Lab Results  Component Value Date   CHOLHDL 3 05/02/2018   Lab Results  Component Value Date   HGBA1C 6.8 (H) 05/02/2018       Assessment & Plan:   Problem List Items Addressed This Visit    Hypothyroidism    On Levothyroxine, continue to monitor      Relevant Orders   TSH (Completed)   T4, free (Completed)   DM (diabetes mellitus) ([place])    hgba1c acceptable, minimize simple carbs. Increase exercise as tolerated. Continue current meds      Relevant Orders   Hemoglobin A1c (Completed)   Hyperlipidemia    Tolerating statin, encouraged heart healthy diet, avoid trans fats, minimize simple carbs and saturated fats. Increase exercise as tolerated      Relevant Orders   Lipid panel (Completed)   Benign prostatic hyperplasia    Check psa      Relevant Orders   PSA (Completed)   Preventative health care    Patient encouraged to maintain heart healthy diet, regular exercise, adequate sleep. Consider daily probiotics. Take medications as prescribed      Relevant Orders   PSA (Completed)   Hypertension    Well controlled, no changes to meds. Encouraged heart healthy diet such as the DASH diet and exercise as tolerated.        Relevant Orders   CBC (Completed)   Comprehensive metabolic panel (Completed)   Sleep apnea    Does not wear a mask has not needed it since tonsils removed and uvulectomy. Snoring and sleep have been much better.       Tremor    Right hand with a bit of a shuffling gait. Tremor only happens once to twice a week for 1-5 minutes consider neurology consult if worsens.        Other Visit Diagnoses    Need for influenza vaccination    -  Primary   Relevant Orders   Flu vaccine HIGH DOSE PF (Fluzone High dose) (Completed)      I have discontinued Gwyndolyn Saxon T. Volner's nystatin ointment and levothyroxine. I am also having him maintain his multivitamin with minerals, loratadine, aspirin EC, latanoprost, cholecalciferol, ONETOUCH DELICA LANCETS 76H, atorvastatin, atenolol, metFORMIN,  finasteride, glucose blood, and tamsulosin.  No orders of the defined types were placed in this encounter.    Penni Homans, MD

## 2018-05-09 ENCOUNTER — Ambulatory Visit (HOSPITAL_BASED_OUTPATIENT_CLINIC_OR_DEPARTMENT_OTHER)
Admission: RE | Admit: 2018-05-09 | Discharge: 2018-05-09 | Disposition: A | Discharge status: Home / Self Care | Payer: Medicare Other | Source: Ambulatory Visit | Attending: Cardiology | Admitting: Cardiology

## 2018-05-09 ENCOUNTER — Encounter: Payer: Self-pay | Admitting: *Deleted

## 2018-05-09 ENCOUNTER — Ambulatory Visit (INDEPENDENT_AMBULATORY_CARE_PROVIDER_SITE_OTHER): Payer: Medicare Other | Admitting: *Deleted

## 2018-05-09 VITALS — BP 142/82 | HR 65 | Ht 72.0 in | Wt 208.8 lb

## 2018-05-09 DIAGNOSIS — I714 Abdominal aortic aneurysm, without rupture, unspecified: Secondary | ICD-10-CM

## 2018-05-09 DIAGNOSIS — Z0000 Encounter for general adult medical examination without abnormal findings: Secondary | ICD-10-CM | POA: Diagnosis not present

## 2018-05-09 NOTE — Patient Instructions (Signed)
Please schedule your next medicare wellness visit with me in 1 yr.  Bring a copy of your living will and/or healthcare power of attorney to your next office visit.    Mr. Harry Brown , Thank you for taking time to come for your Medicare Wellness Visit. I appreciate your ongoing commitment to your health goals. Please review the following plan we discussed and let me know if I can assist you in the future.   These are the goals we discussed: Goals    . Healthy Lifestyle     Continue to eat heart healthy diet (full of fruits, vegetables, whole grains, lean protein, water--limit salt, fat, and sugar intake) and increase physical activity as tolerated. Continue doing brain stimulating activities (puzzles, reading, adult coloring books, staying active) to keep memory sharp.         This is a list of the screening recommended for you and due dates:  Health Maintenance  Topic Date Due  . Complete foot exam   03/11/2017  . Urine Protein Check  03/11/2017  . Hemoglobin A1C  10/31/2018  . Eye exam for diabetics  02/08/2019  . Tetanus Vaccine  06/08/2019  . Flu Shot  Completed  . Pneumonia vaccines  Completed    Health Maintenance, Male A healthy lifestyle and preventive care is important for your health and wellness. Ask your health care provider about what schedule of regular examinations is right for you. What should I know about weight and diet? Eat a Healthy Diet  Eat plenty of vegetables, fruits, whole grains, low-fat dairy products, and lean protein.  Do not eat a lot of foods high in solid fats, added sugars, or salt.  Maintain a Healthy Weight Regular exercise can help you achieve or maintain a healthy weight. You should:  Do at least 150 minutes of exercise each week. The exercise should increase your heart rate and make you sweat (moderate-intensity exercise).  Do strength-training exercises at least twice a week.  Watch Your Levels of Cholesterol and Blood Lipids  Have  your blood tested for lipids and cholesterol every 5 years starting at 78 years of age. If you are at high risk for heart disease, you should start having your blood tested when you are 78 years old. You may need to have your cholesterol levels checked more often if: ? Your lipid or cholesterol levels are high. ? You are older than 78 years of age. ? You are at high risk for heart disease.  What should I know about cancer screening? Many types of cancers can be detected early and may often be prevented. Lung Cancer  You should be screened every year for lung cancer if: ? You are a current smoker who has smoked for at least 30 years. ? You are a former smoker who has quit within the past 15 years.  Talk to your health care provider about your screening options, when you should start screening, and how often you should be screened.  Colorectal Cancer  Routine colorectal cancer screening usually begins at 78 years of age and should be repeated every 5-10 years until you are 78 years old. You may need to be screened more often if early forms of precancerous polyps or small growths are found. Your health care provider may recommend screening at an earlier age if you have risk factors for colon cancer.  Your health care provider may recommend using home test kits to check for hidden blood in the stool.  A small camera  at the end of a tube can be used to examine your colon (sigmoidoscopy or colonoscopy). This checks for the earliest forms of colorectal cancer.  Prostate and Testicular Cancer  Depending on your age and overall health, your health care provider may do certain tests to screen for prostate and testicular cancer.  Talk to your health care provider about any symptoms or concerns you have about testicular or prostate cancer.  Skin Cancer  Check your skin from head to toe regularly.  Tell your health care provider about any new moles or changes in moles, especially if: ? There is  a change in a mole's size, shape, or color. ? You have a mole that is larger than a pencil eraser.  Always use sunscreen. Apply sunscreen liberally and repeat throughout the day.  Protect yourself by wearing long sleeves, pants, a wide-brimmed hat, and sunglasses when outside.  What should I know about heart disease, diabetes, and high blood pressure?  If you are 39-34 years of age, have your blood pressure checked every 3-5 years. If you are 96 years of age or older, have your blood pressure checked every year. You should have your blood pressure measured twice-once when you are at a hospital or clinic, and once when you are not at a hospital or clinic. Record the average of the two measurements. To check your blood pressure when you are not at a hospital or clinic, you can use: ? An automated blood pressure machine at a pharmacy. ? A home blood pressure monitor.  Talk to your health care provider about your target blood pressure.  If you are between 28-20 years old, ask your health care provider if you should take aspirin to prevent heart disease.  Have regular diabetes screenings by checking your fasting blood sugar level. ? If you are at a normal weight and have a low risk for diabetes, have this test once every three years after the age of 53. ? If you are overweight and have a high risk for diabetes, consider being tested at a younger age or more often.  A one-time screening for abdominal aortic aneurysm (AAA) by ultrasound is recommended for men aged 49-75 years who are current or former smokers. What should I know about preventing infection? Hepatitis B If you have a higher risk for hepatitis B, you should be screened for this virus. Talk with your health care provider to find out if you are at risk for hepatitis B infection. Hepatitis C Blood testing is recommended for:  Everyone born from [birth year] through 1966.  Anyone with known risk factors for hepatitis C.  Sexually  Transmitted Diseases (STDs)  You should be screened each year for STDs including gonorrhea and chlamydia if: ? You are sexually active and are younger than 78 years of age. ? You are older than 78 years of age and your health care provider tells you that you are at risk for this type of infection. ? Your sexual activity has changed since you were last screened and you are at an increased risk for chlamydia or gonorrhea. Ask your health care provider if you are at risk.  Talk with your health care provider about whether you are at high risk of being infected with HIV. Your health care provider may recommend a prescription medicine to help prevent HIV infection.  What else can I do?  Schedule regular health, dental, and eye exams.  Stay current with your vaccines (immunizations).  Do not use any tobacco products,  such as cigarettes, chewing tobacco, and e-cigarettes. If you need help quitting, ask your health care provider.  Limit alcohol intake to no more than 2 drinks per day. One drink equals 12 ounces of beer, 5 ounces of wine, or 1 ounces of hard liquor.  Do not use street drugs.  Do not share needles.  Ask your health care provider for help if you need support or information about quitting drugs.  Tell your health care provider if you often feel depressed.  Tell your health care provider if you have ever been abused or do not feel safe at home. This information is not intended to replace advice given to you by your health care provider. Make sure you discuss any questions you have with your health care provider. Document Released: 11/20/2007 Document Revised: 01/21/2016 Document Reviewed: 02/25/2015 Elsevier Interactive Patient Education  [employer].

## 2018-05-10 ENCOUNTER — Ambulatory Visit (INDEPENDENT_AMBULATORY_CARE_PROVIDER_SITE_OTHER): Payer: Medicare Other | Admitting: Cardiology

## 2018-05-10 ENCOUNTER — Encounter: Payer: Self-pay | Admitting: Cardiology

## 2018-05-10 VITALS — BP 126/70 | HR 75 | Ht 72.0 in | Wt 212.0 lb

## 2018-05-10 DIAGNOSIS — I714 Abdominal aortic aneurysm, without rupture, unspecified: Secondary | ICD-10-CM

## 2018-05-10 DIAGNOSIS — I2510 Atherosclerotic heart disease of native coronary artery without angina pectoris: Secondary | ICD-10-CM | POA: Diagnosis not present

## 2018-05-10 DIAGNOSIS — I10 Essential (primary) hypertension: Secondary | ICD-10-CM

## 2018-05-10 DIAGNOSIS — E7800 Pure hypercholesterolemia, unspecified: Secondary | ICD-10-CM

## 2018-05-10 NOTE — Patient Instructions (Signed)
Medication Instructions:   NO CHANGE  Follow-Up:  Your physician recommends that you schedule a follow-up appointment in: [place] 2 MONTHS PRIOR TO THAT APPOINTMENT TIME TO SCHEDULE

## 2018-05-26 DIAGNOSIS — H401211 Low-tension glaucoma, right eye, mild stage: Secondary | ICD-10-CM | POA: Diagnosis not present

## 2018-05-26 DIAGNOSIS — E119 Type 2 diabetes mellitus without complications: Secondary | ICD-10-CM | POA: Diagnosis not present

## 2018-05-26 DIAGNOSIS — H04123 Dry eye syndrome of bilateral lacrimal glands: Secondary | ICD-10-CM | POA: Diagnosis not present

## 2018-05-26 DIAGNOSIS — H401221 Low-tension glaucoma, left eye, mild stage: Secondary | ICD-10-CM | POA: Diagnosis not present

## 2018-05-26 LAB — HM DIABETES EYE EXAM

## 2018-05-29 ENCOUNTER — Telehealth: Payer: Self-pay | Admitting: *Deleted

## 2018-05-29 NOTE — Telephone Encounter (Signed)
Received Diabetic Eye Exam Report from [facility]; forwarded to provider/SLS 12/23

## 2018-06-06 ENCOUNTER — Encounter: Payer: Self-pay | Admitting: Family Medicine

## 2018-06-24 NOTE — Progress Notes (Signed)
HISTORY AND PHYSICAL     CC:  Follow up Requesting Provider:  [name], MD  HPI: This is a 78 y.o. male who is s/p femoral to popliteal bypass grafting with non reversed saphenous vein on 04/25/13 by Dr. Bridgett Larsson.  His post operative course was complicated by non healing wounds that eventually healed.    He does have a AAA that is being monitored by Dr. Stanford Breed.  In December, it measured 3.9cm with plans to have it scanned again in December 2020.  His CAD s/p CABG is doing well without chest pain and continued on medical tx with asa/statin.  Blood pressure is well controlled.  He denies chest pain or shortness of breath.    Pt states he gets cramping in his calves after walking about a city block but continues to walk through it and it resolves.  He denies any non healing wounds on his feet.   The pt is on a statin for cholesterol management.    The pt does have diabetes. He is on oral agent. The pt is on beta blocker  for hypertension.  He takes a daily asa.    Past Medical History:  Diagnosis Date  . Anemia 09/24/2013  . CAD (coronary artery disease)   . Cellulitis 05/14/2013   RLE  . Diverticulitis   . ED (erectile dysfunction)   . Glaucoma 08/18/2014  . History of sleep apnea    had surgery to correct  . Hypertension   . Hypothyroidism   . Medicare annual wellness visit, subsequent 04/13/2013  . Myocardial infarction ([place]) 11/05/1985  . Nocturia 03/15/2017  . Other and unspecified hyperlipidemia   . Penile lesion 02/24/2015  . Peripheral neuropathy 03/31/2012  . Peripheral vascular disease ([place])   . Postoperative anemia due to acute blood loss 09/24/2013  . Preventative health care 03/13/2016  . Sleep apnea 09/16/2017  . Type II diabetes mellitus (HCC)    fasting 100-120  . Urinary urgency 11/22/2012    Past Surgical History:  Procedure Laterality Date  . ABDOMINAL AORTAGRAM  Oct. 9, 2014   Dr. Bridgett Larsson  . ABDOMINAL AORTAGRAM N/A 03/15/2013   Procedure: ABDOMINAL  Maxcine Ham;  Surgeon: Conrad Edinburg, MD;  Location: [facility] CATH LAB;  Service: Cardiovascular;  Laterality: N/A;  . ANTERIOR CERVICAL DECOMP/DISCECTOMY FUSION  ~ 2000  . CATARACT EXTRACTION Right   . CORONARY ARTERY BYPASS GRAFT  2001   "CABG X3" (05/14/2013)  . DENTAL SURGERY     "multiple teeth removed; trmimed top of mouth so dentures would fit" (05/14/2013)  . EYE SURGERY     cataracts  . FEMORAL-POPLITEAL BYPASS GRAFT Right 04/25/2013   Procedure: RIGHT FEMORAL TO ABOVE KNEE POPLITEAL BYPASS GRAFT WITH RIGHT GREATER SAPHENOUS VEIN HARVEST; ULTRASOUND GUIDED;  Surgeon: Conrad Williston, MD;  Location: [facility] OR;  Service: Vascular;  Laterality: Right;  . GUM SURGERY  [phone]   "had bone scraped; had periodontal disease" (05/14/2013)  . PALATE / UVULA BIOPSY / EXCISION  2003   Removed due to sleep apnea  . TONSILLECTOMY      Allergies  Allergen Reactions  . Adhesive [Tape]   . Latex     rash  . Other Hives    Plastic shopping bags Metal staples: rash, swelling    Current Outpatient Medications  Medication Sig Dispense Refill  . aspirin EC 81 MG tablet Take 81 mg by mouth at bedtime.    Marland Kitchen atenolol (TENORMIN) 25 MG tablet Take 1 tablet (25 mg total)  by mouth daily. 30 tablet 5  . atorvastatin (LIPITOR) 40 MG tablet TAKE ONE (1) TABLET BY MOUTH EVERY DAY AT 6PM. 30 tablet 11  . cholecalciferol (VITAMIN D) 1000 units tablet Take 1,000 Units by mouth daily.    . finasteride (PROSCAR) 5 MG tablet TAKE ONE (1) TABLET BY MOUTH EVERY DAY 30 tablet 2  . glucose blood (ONE TOUCH ULTRA TEST) test strip USE ONCE A DAY 100 each 12  . latanoprost (XALATAN) 0.005 % ophthalmic solution Place into both eyes at bedtime.    Marland Kitchen loratadine (ALAVERT) 10 MG tablet Take 10 mg by mouth daily as needed for allergies.    . metFORMIN (GLUCOPHAGE-XR) 500 MG 24 hr tablet TAKE TWO TABLETS BY MOUTH EVERY MORNING WITH BREAKFAST 60 tablet 2  . Multiple Vitamin (MULTIVITAMIN WITH MINERALS) TABS tablet Take 1 tablet by mouth  daily.    Glory Rosebush DELICA LANCETS 76E MISC USE TO CHECK BLOOD SUGAR ONCE DAILY 100 each 1  . tamsulosin (FLOMAX) 0.4 MG CAPS capsule TAKE ONE CAPSULE BY MOUTH DAILY 30 capsule 2   No current facility-administered medications for this visit.     Family History  Problem Relation Age of Onset  . Hyperlipidemia Mother   . Heart disease Mother   . Diabetes Mother   . Hypertension Mother   . Heart disease Father   . Asthma Father   . Arthritis Father   . Heart disease Sister   . Heart disease Sister        s/p bypass x 2  . Lupus Sister   . Heart disease Brother        MI waiting on heart transplant when died  . Heart disease Brother        massive MI  . Cancer Neg Hx     Social History   Socioeconomic History  . Marital status: Married    Spouse name: Not on file  . Number of children: 8   . Years of education: Not on file  . Highest education level: Not on file  Occupational History    Employer: [employer]  Social Needs  . Financial resource strain: Not on file  . Food insecurity:    Worry: Not on file    Inability: Not on file  . Transportation needs:    Medical: Not on file    Non-medical: Not on file  Tobacco Use  . Smoking status: Former Smoker    Packs/day: 1.50    Years: 50.00    Pack years: 75.00    Types: Cigarettes  . Smokeless tobacco: Never Used  . Tobacco comment: 05/14/2013 "quit smoking in ~ 2004; still Uses Comit lozenges"  Substance and Sexual Activity  . Alcohol use: No    Alcohol/week: 0.0 standard drinks  . Drug use: No  . Sexual activity: Yes    Comment: lives with wife, retiring end of next week, no dietary restrictions  Lifestyle  . Physical activity:    Days per week: Not on file    Minutes per session: Not on file  . Stress: Not on file  Relationships  . Social connections:    Talks on phone: Not on file    Gets together: Not on file    Attends religious service: Not on file    Active member of club or  organization: Not on file    Attends meetings of clubs or organizations: Not on file    Relationship status: Not on file  .  Intimate partner violence:    Fear of current or ex partner: Not on file    Emotionally abused: Not on file    Physically abused: Not on file    Forced sexual activity: Not on file  Other Topics Concern  . 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 [employer]     REVIEW OF SYSTEMS:   [X]  denotes positive finding, [ ]  denotes negative finding Cardiac  Comments:  Chest pain or chest pressure:    Shortness of breath upon exertion:    Short of breath when lying flat:    Irregular heart rhythm:        Vascular    Pain in calf, thigh, or hip brought on by ambulation: x   Pain in feet at night that wakes you up from your sleep:     Blood clot in your veins:    Leg swelling:         Pulmonary    Oxygen at home:    Productive cough:     Wheezing:         Neurologic    Sudden weakness in arms or legs:     Sudden numbness in arms or legs:     Sudden onset of difficulty speaking or slurred speech:    Temporary loss of vision in one eye:     Problems with dizziness:         Gastrointestinal    Blood in stool:     Vomited blood:         Genitourinary    Burning when urinating:     Blood in urine:        Psychiatric    Major depression:         Hematologic    Bleeding problems:    Problems with blood clotting too easily:        Skin    Rashes or ulcers:        Constitutional    Fever or chills:      PHYSICAL EXAMINATION:  Today's Vitals   06/26/18 1453  Pulse: 82  Resp: 18  Temp: (!) 97.4 F (36.3 C)  TempSrc: Oral  SpO2: 99%  Weight: 212 lb (96.2 kg)  Height: 6' (1.829 m)   Body mass index is 28.75 kg/m.   General:  WDWN in NAD; vital signs documented above Gait: Not observed HENT: WNL, normocephalic Pulmonary: normal non-labored breathing , without Rales, rhonchi,   wheezing Cardiac: regular HR, without  Murmurs without carotid bruits Abdomen: soft, NT, no masses Skin: without rashes Vascular Exam/Pulses:  Right Left  Radial 2+ (normal) 2+ (normal)  Ulnar Unable to palpate  Unable to palpate   DP Unable to palpate  Unable to palpate   PT 2+ (normal) Unable to palpate    Extremities: without ischemic changes, without Gangrene , without cellulitis; without open wounds;  Musculoskeletal: no muscle wasting or atrophy  Neurologic: A&O X 3;  No focal weakness or paresthesias are detected Psychiatric:  The pt has Normal affect.   Non-Invasive Vascular Imaging:   RLE arterial duplex 06/26/2018: Patent right femoral to popliteal bypass graft with no evidence of restenosis.  ABI's/TBI's 06/26/2018: Right:  1.14/0.96  PT:  (B)  DP: (B) Left:  0.69/0.74  PT: (M)  DP: (M)  Previous RLE arterial duplex 05/27/17: Patent right femoral to popliteal artery bypass graftwith no significant stenosis.  All biphasic waveforms.  No significant change compared to exams on 04-11-15 and 05-21-16.   ABI's/TBI's 05/27/17: Right:  0.96/0.61  PT:  (T)  DP  (M) Left:  0.74/0.42  PT  (M)  DP  (M)  Pt meds includes: Statin:  Yes.   Beta Blocker:  Yes.   Aspirin:  Yes.   ACEI:  No. ARB:  No. CCB use:  No Other Antiplatelet/Anticoagulant:  No   ASSESSMENT/PLAN:: 78 y.o. male femoral to popliteal bypass grafting with non reversed saphenous vein on 04/25/13 by Dr. Bridgett Larsson   -PVD:  ABI's essentially unchanged from a year ago and bypass is patent by duplex. He does get mild claudication after walking ~ a city block but is able to walk though it until it resolves.  He does not have non healing wounds.  We will see him back in 1 year with ABI's and RLE arterial duplex.  -AAA:  Measured 3.9cm in December.  Continued surveillance by Dr. Stanford Breed.  Re-scan in December 2020. -continue statin/asa   Leontine Locket, PA-C [facility] [phone]  Clinic MD: Trula Slade

## 2018-06-26 ENCOUNTER — Other Ambulatory Visit: Payer: Self-pay

## 2018-06-26 ENCOUNTER — Ambulatory Visit (INDEPENDENT_AMBULATORY_CARE_PROVIDER_SITE_OTHER): Payer: Medicare Other | Admitting: Physician Assistant

## 2018-06-26 ENCOUNTER — Ambulatory Visit (HOSPITAL_COMMUNITY)
Admission: RE | Admit: 2018-06-26 | Discharge: 2018-06-26 | Disposition: A | Discharge status: Home / Self Care | Payer: Medicare Other | Source: Ambulatory Visit | Attending: Family | Admitting: Family

## 2018-06-26 ENCOUNTER — Encounter: Payer: Self-pay | Admitting: Physician Assistant

## 2018-06-26 ENCOUNTER — Ambulatory Visit (INDEPENDENT_AMBULATORY_CARE_PROVIDER_SITE_OTHER)
Admission: RE | Admit: 2018-06-26 | Discharge: 2018-06-26 | Disposition: A | Discharge status: Home / Self Care | Payer: Medicare Other | Source: Ambulatory Visit | Attending: Family | Admitting: Family

## 2018-06-26 VITALS — BP 130/72 | HR 82 | Temp 97.4°F | Resp 18 | Ht 72.0 in | Wt 212.0 lb

## 2018-06-26 DIAGNOSIS — Z95828 Presence of other vascular implants and grafts: Secondary | ICD-10-CM

## 2018-06-26 DIAGNOSIS — E1151 Type 2 diabetes mellitus with diabetic peripheral angiopathy without gangrene: Secondary | ICD-10-CM

## 2018-06-26 DIAGNOSIS — I779 Disorder of arteries and arterioles, unspecified: Secondary | ICD-10-CM | POA: Diagnosis not present

## 2018-06-26 DIAGNOSIS — Z87891 Personal history of nicotine dependence: Secondary | ICD-10-CM | POA: Diagnosis not present

## 2018-07-04 ENCOUNTER — Other Ambulatory Visit: Payer: Self-pay | Admitting: Family Medicine

## 2018-07-17 ENCOUNTER — Other Ambulatory Visit: Payer: Self-pay | Admitting: Family Medicine

## 2018-07-24 ENCOUNTER — Ambulatory Visit (INDEPENDENT_AMBULATORY_CARE_PROVIDER_SITE_OTHER): Payer: Medicare Other | Admitting: Medical

## 2018-07-24 ENCOUNTER — Encounter: Payer: Self-pay | Admitting: Medical

## 2018-07-24 VITALS — BP 147/63 | HR 90 | Temp 98.2°F | Resp 18 | Wt 206.0 lb

## 2018-07-24 DIAGNOSIS — R001 Bradycardia, unspecified: Secondary | ICD-10-CM

## 2018-07-24 DIAGNOSIS — L732 Hidradenitis suppurativa: Secondary | ICD-10-CM | POA: Diagnosis not present

## 2018-07-24 DIAGNOSIS — L089 Local infection of the skin and subcutaneous tissue, unspecified: Secondary | ICD-10-CM | POA: Diagnosis not present

## 2018-07-24 MED ORDER — DOXYCYCLINE HYCLATE 100 MG PO TABS: 100.0000 mg | ORAL_TABLET | Freq: Two times a day (BID) | ORAL | 0 refills | Status: DC

## 2018-07-24 NOTE — Progress Notes (Signed)
Subjective:    Patient ID: Harry Brown, male    DOB: [birth year], 78 y.o.   MRN: [id number]  HPI  Pt in for some bumps in axillary area. No hx of this before. He has noticed them for about one week. Pt states tried some campho phenic. But it did not help.   The area have hurt a little in the left axillary area. He noticed areas just on left side.   Pt never had blisters formed in area.   Pt initially had low pulse before I came in. Pt report No history of know low pulses. On review of past office visits he had occasionally pulse less than 60.  Most recent levels have been above 60.  Patient does not report any cardiac signs or symptoms today.   Review of Systems  Constitutional: Negative for chills, fatigue and fever.  Respiratory: Negative for cough, choking, chest tightness, shortness of breath, wheezing and stridor.   Cardiovascular: Negative for chest pain and palpitations.  Gastrointestinal: Negative for abdominal pain.  Musculoskeletal: Negative for back pain.  Skin: Negative for rash.       See hpi.  Neurological: Negative for dizziness, speech difficulty, weakness and headaches.  Hematological: Negative for adenopathy. Does not bruise/bleed easily.  Psychiatric/Behavioral: Negative for behavioral problems and confusion.    Past Medical History:  Diagnosis Date  . Anemia 09/24/2013  . CAD (coronary artery disease)   . Cellulitis 05/14/2013   RLE  . Diverticulitis   . ED (erectile dysfunction)   . Glaucoma 08/18/2014  . History of sleep apnea    had surgery to correct  . Hypertension   . Hypothyroidism   . Medicare annual wellness visit, subsequent 04/13/2013  . Myocardial infarction ([place]) 11/05/1985  . Nocturia 03/15/2017  . Other and unspecified hyperlipidemia   . Penile lesion 02/24/2015  . Peripheral neuropathy 03/31/2012  . Peripheral vascular disease ([place])   . Postoperative anemia due to acute blood loss 09/24/2013  . Preventative health care 03/13/2016  .  Sleep apnea 09/16/2017  . Type II diabetes mellitus (HCC)    fasting 100-120  . Urinary urgency 11/22/2012     Social History   Socioeconomic History  . Marital status: Married    Spouse name: Not on file  . Number of children: 8   . Years of education: Not on file  . Highest education level: Not on file  Occupational History    Employer: [employer]  Social Needs  . Financial resource strain: Not on file  . Food insecurity:    Worry: Not on file    Inability: Not on file  . Transportation needs:    Medical: Not on file    Non-medical: Not on file  Tobacco Use  . Smoking status: Former Smoker    Packs/day: 1.50    Years: 50.00    Pack years: 75.00    Types: Cigarettes  . Smokeless tobacco: Never Used  . Tobacco comment: 05/14/2013 "quit smoking in ~ 2004; still Uses Comit lozenges"  Substance and Sexual Activity  . Alcohol use: No    Alcohol/week: 0.0 standard drinks  . Drug use: No  . Sexual activity: Yes    Comment: lives with wife, retiring end of next week, no dietary restrictions  Lifestyle  . Physical activity:    Days per week: Not on file    Minutes per session: Not on file  . Stress: Not on file  Relationships  . Social connections:  Talks on phone: Not on file    Gets together: Not on file    Attends religious service: Not on file    Active member of club or organization: Not on file    Attends meetings of clubs or organizations: Not on file    Relationship status: Not on file  . Intimate partner violence:    Fear of current or ex partner: Not on file    Emotionally abused: Not on file    Physically abused: Not on file    Forced sexual activity: Not on file  Other Topics Concern  . 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 ABDOMINAL AORTAGRAM  Oct. 9, 2014   Dr. Bridgett Larsson  . ABDOMINAL AORTAGRAM N/A 03/15/2013    Procedure: ABDOMINAL Maxcine Ham;  Surgeon: Conrad Goodman, MD;  Location: [facility] CATH LAB;  Service: Cardiovascular;  Laterality: N/A;  . ANTERIOR CERVICAL DECOMP/DISCECTOMY FUSION  ~ 2000  . CATARACT EXTRACTION Right   . CORONARY ARTERY BYPASS GRAFT  2001   "CABG X3" (05/14/2013)  . DENTAL SURGERY     "multiple teeth removed; trmimed top of mouth so dentures would fit" (05/14/2013)  . EYE SURGERY     cataracts  . FEMORAL-POPLITEAL BYPASS GRAFT Right 04/25/2013   Procedure: RIGHT FEMORAL TO ABOVE KNEE POPLITEAL BYPASS GRAFT WITH RIGHT GREATER SAPHENOUS VEIN HARVEST; ULTRASOUND GUIDED;  Surgeon: Conrad Waterford, MD;  Location: [facility] OR;  Service: Vascular;  Laterality: Right;  . GUM SURGERY  [phone]   "had bone scraped; had periodontal disease" (05/14/2013)  . PALATE / UVULA BIOPSY / EXCISION  2003   Removed due to sleep apnea  . TONSILLECTOMY      Family History  Problem Relation Age of Onset  . Hyperlipidemia Mother   . Heart disease Mother   . Diabetes Mother   . Hypertension Mother   . Heart disease Father   . Asthma Father   . Arthritis Father   . Heart disease Sister   . Heart disease Sister        s/p bypass x 2  . Lupus Sister   . Heart disease Brother        MI waiting on heart transplant when died  . Heart disease Brother        massive MI  . Cancer Neg Hx     Allergies  Allergen Reactions  . Adhesive [Tape]   . Latex     rash  . Other Hives    Plastic shopping bags Metal staples: rash, swelling    Current Outpatient Medications on File Prior to Visit  Medication Sig Dispense Refill  . aspirin EC 81 MG tablet Take 81 mg by mouth at bedtime.    Marland Kitchen atenolol (TENORMIN) 25 MG tablet TAKE ONE (1) TABLET BY MOUTH EACH DAY 30 tablet 5  . atorvastatin (LIPITOR) 40 MG tablet TAKE ONE (1) TABLET BY MOUTH EVERY DAY AT 6PM. 30 tablet 11  . cholecalciferol (VITAMIN D) 1000 units tablet Take 1,000 Units by mouth daily.    . finasteride (PROSCAR) 5 MG tablet TAKE ONE (1) TABLET BY MOUTH  EVERY DAY 30 tablet 2  . glucose blood (ONE TOUCH ULTRA TEST) test strip USE ONCE A DAY 100 each 12  . latanoprost (XALATAN) 0.005 % ophthalmic solution Place into both eyes at bedtime.    Marland Kitchen loratadine (  ALAVERT) 10 MG tablet Take 10 mg by mouth daily as needed for allergies.    . metFORMIN (GLUCOPHAGE-XR) 500 MG 24 hr tablet TAKE 2 TABLETS BY MOUTH EACH MORNING WITH BREAKFAST 60 tablet 2  . Multiple Vitamin (MULTIVITAMIN WITH MINERALS) TABS tablet Take 1 tablet by mouth daily.    Glory Rosebush DELICA LANCETS 01T MISC USE TO CHECK BLOOD SUGAR ONCE DAILY 100 each 1  . tamsulosin (FLOMAX) 0.4 MG CAPS capsule TAKE ONE (1) CAPSULE EACH DAY BY MOUTH 30 capsule 2   No current facility-administered medications on file prior to visit.     BP (!) 147/63 (BP Location: Left Arm, Patient Position: Sitting, Cuff Size: Normal)   Pulse 90 Comment: walked down hall his bp went up graually to 70,80 then 90.  Temp 98.2 F (36.8 C) (Oral)   Resp 18   Wt 206 lb (93.4 kg)   SpO2 100%   BMI 27.94 kg/m       Objective:   Physical Exam  General- No acute distress. Pleasant patient. Neck- Full range of motion, no jvd Lungs- Clear, even and unlabored. Heart- regular rate and rhythm. Neurologic- CNII- XII grossly intact.  Skin- 5 scab lesions in left axillary area. One area has yellow brown discharge.  Rt axillary only one scab.        Assessment & Plan:  I do think you have some inflamed follicles and axillary regions with infection.  The area of discharge was sampled and will send out wound culture.  I am prescribing doxycycline antibiotic.  Rx advisement given.  Will follow wound culture.  If area worsens/spreads let us know.  However expect area to improve quickly.  You had initial slow heart rate resting while waiting for me.  This is happened periodically in the past but you did have good pulse on ambulating in office.  Appropriate response to exercise.  Continue to follow-up as really schedule with  your cardiologist.  Follow-up in 7 days or as needed.

## 2018-07-24 NOTE — Patient Instructions (Addendum)
I do think you have some inflamed follicles and axillary regions with infection.  The area of discharge was sampled and will send out wound culture.  I am prescribing doxycycline antibiotic.  Rx advisement given.  Will follow wound culture.  If area worsens/spreads let us know.  However expect area to improve quickly.  You had initial slow heart rate resting while waiting for me.  This is happened periodically in the past but you did have good pulse on ambulating in office.  Appropriate response to exercise.  Continue to follow-up as really schedule with your cardiologist.  Follow-up in 7 days or as needed.

## 2018-07-25 ENCOUNTER — Telehealth: Payer: Self-pay

## 2018-07-25 MED ORDER — LEVOTHYROXINE SODIUM 75 MCG PO TABS: ORAL_TABLET | ORAL | 1 refills | Status: DC

## 2018-07-25 NOTE — Telephone Encounter (Signed)
Copied from [place] [id number]. Topic: General - Other >> Jul 21, 2018  2:17 PM Carolyn Stare wrote:  Pt call to ask for a refill on LEVOTHYROXINE not show on his med list     [facility]

## 2018-07-27 LAB — WOUND CULTURE
MICRO NUMBER:: 203763
SPECIMEN QUALITY:: ADEQUATE

## 2018-07-31 ENCOUNTER — Ambulatory Visit (INDEPENDENT_AMBULATORY_CARE_PROVIDER_SITE_OTHER): Payer: Medicare Other | Admitting: Medical

## 2018-07-31 ENCOUNTER — Encounter: Payer: Self-pay | Admitting: Medical

## 2018-07-31 VITALS — BP 153/53 | HR 68 | Temp 98.1°F | Resp 16 | Ht 74.0 in | Wt 210.0 lb

## 2018-07-31 DIAGNOSIS — R001 Bradycardia, unspecified: Secondary | ICD-10-CM | POA: Diagnosis not present

## 2018-07-31 DIAGNOSIS — L089 Local infection of the skin and subcutaneous tissue, unspecified: Secondary | ICD-10-CM | POA: Diagnosis not present

## 2018-07-31 MED ORDER — DOXYCYCLINE HYCLATE 100 MG PO TABS: 100.0000 mg | ORAL_TABLET | Freq: Two times a day (BID) | ORAL | 0 refills | Status: DC

## 2018-07-31 NOTE — Patient Instructions (Addendum)
Your left axillary area skin infection looks much better.  There is only 1 of the previous areas that looks a little hyperpigmented and has some mild indurated/minimal hard feeling.  This may represent residual infection.  Will provide additional 3 days of doxycycline.  Thereafter should not need any further treatment.  You do have history of preventricular contractions on EKGs and some bradycardia recently.  Though on activity/walking in our office last week your pulse was in appropriate range.  Also when you did the EKG today your heart rate came up to 68.  You are asymptomatic but due to the intermittent bradycardia in the range of low 40s to mid 30 range, I am going to refer you back to your cardiologist.  If you were to get any cardiac type signs or symptoms and recommend ED evaluation.  To reduce PVCs potentially recommend discontinuing or cutting back on caffeine.   Follow-up as regularly scheduled with pcp or as needed

## 2018-07-31 NOTE — Progress Notes (Signed)
Subjective:    Patient ID: Harry Brown, male    DOB: 02-08-1941, 78 y.o.   MRN: [id number]  HPI Pt left  arm axillary area is improved now after antibiotics. See last note. Culture grew out staph. Doxy was on sensitive report. He feels a lot better.  Pt pulse in mid 30's initially when first came in. No cardiac symptoms. Hx of bigeminy pvc. Similar ekg today. Last time on walking down hall his pulse increased to 80 range. Today by time ekg done was 68.   Review of Systems  Constitutional: Negative for chills, fatigue and fever.  Respiratory: Negative for cough, chest tightness, shortness of breath and wheezing.   Cardiovascular: Negative for chest pain and palpitations.       See hpi.  Gastrointestinal: Negative for abdominal pain.  Musculoskeletal: Negative for back pain.  Skin: Negative for rash.       Much better.  Neurological: Negative for facial asymmetry.  Hematological: Negative for adenopathy. Does not bruise/bleed easily.  Psychiatric/Behavioral: Negative for behavioral problems.    Past Medical History:  Diagnosis Date  . Anemia 09/24/2013  . CAD (coronary artery disease)   . Cellulitis 05/14/2013   RLE  . Diverticulitis   . ED (erectile dysfunction)   . Glaucoma 08/18/2014  . History of sleep apnea    had surgery to correct  . Hypertension   . Hypothyroidism   . Medicare annual wellness visit, subsequent 04/13/2013  . Myocardial infarction ([place]) 11/05/1985  . Nocturia 03/15/2017  . Other and unspecified hyperlipidemia   . Penile lesion 02/24/2015  . Peripheral neuropathy 03/31/2012  . Peripheral vascular disease ([place])   . Postoperative anemia due to acute blood loss 09/24/2013  . Preventative health care 03/13/2016  . Sleep apnea 09/16/2017  . Type II diabetes mellitus (H[facility])    fasting 100-120  . Urinary urgency 11/22/2012     Social History   Socioeconomic History  . Marital status: Married    Spouse name: Not on file  . Number of children: 8   .  Years of education: Not on file  . Highest education level: Not on file  Occupational History    Employer: [employer]  Social Needs  . Financial resource strain: Not on file  . Food insecurity:    Worry: Not on file    Inability: Not on file  . Transportation needs:    Medical: Not on file    Non-medical: Not on file  Tobacco Use  . Smoking status: Former Smoker    Packs/day: 1.50    Years: 50.00    Pack years: 75.00    Types: Cigarettes  . Smokeless tobacco: Never Used  . Tobacco comment: 05/14/2013 "quit smoking in ~ 2004; still Uses Comit lozenges"  Substance and Sexual Activity  . Alcohol use: No    Alcohol/week: 0.0 standard drinks  . Drug use: No  . Sexual activity: Yes    Comment: lives with wife, retiring end of next week, no dietary restrictions  Lifestyle  . Physical activity:    Days per week: Not on file    Minutes per session: Not on file  . Stress: Not on file  Relationships  . Social connections:    Talks on phone: Not on file    Gets together: Not on file    Attends religious service: Not on file    Active member of club or organization: Not on file    Attends meetings of clubs or  organizations: Not on file    Relationship status: Not on file  . Intimate partner violence:    Fear of current or ex partner: Not on file    Emotionally abused: Not on file    Physically abused: Not on file    Forced sexual activity: Not on file  Other Topics Concern  . 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 ABDOMINAL AORTAGRAM  Oct. 9, 2014   Dr. Bridgett Larsson  . ABDOMINAL AORTAGRAM N/A 03/15/2013   Procedure: ABDOMINAL Maxcine Ham;  Surgeon: Conrad Somerset, MD;  Location: [facility] CATH LAB;  Service: Cardiovascular;  Laterality: N/A;  . ANTERIOR CERVICAL DECOMP/DISCECTOMY FUSION  ~ 2000  . CATARACT EXTRACTION Right   . CORONARY ARTERY BYPASS  GRAFT  2001   "CABG X3" (05/14/2013)  . DENTAL SURGERY     "multiple teeth removed; trmimed top of mouth so dentures would fit" (05/14/2013)  . EYE SURGERY     cataracts  . FEMORAL-POPLITEAL BYPASS GRAFT Right 04/25/2013   Procedure: RIGHT FEMORAL TO ABOVE KNEE POPLITEAL BYPASS GRAFT WITH RIGHT GREATER SAPHENOUS VEIN HARVEST; ULTRASOUND GUIDED;  Surgeon: Conrad Batavia, MD;  Location: [facility] OR;  Service: Vascular;  Laterality: Right;  . GUM SURGERY  [phone]   "had bone scraped; had periodontal disease" (05/14/2013)  . PALATE / UVULA BIOPSY / EXCISION  2003   Removed due to sleep apnea  . TONSILLECTOMY      Family History  Problem Relation Age of Onset  . Hyperlipidemia Mother   . Heart disease Mother   . Diabetes Mother   . Hypertension Mother   . Heart disease Father   . Asthma Father   . Arthritis Father   . Heart disease Sister   . Heart disease Sister        s/p bypass x 2  . Lupus Sister   . Heart disease Brother        MI waiting on heart transplant when died  . Heart disease Brother        massive MI  . Cancer Neg Hx     Allergies  Allergen Reactions  . Adhesive [Tape]   . Latex     rash  . Other Hives    Plastic shopping bags Metal staples: rash, swelling    Current Outpatient Medications on File Prior to Visit  Medication Sig Dispense Refill  . aspirin EC 81 MG tablet Take 81 mg by mouth at bedtime.    Marland Kitchen atenolol (TENORMIN) 25 MG tablet TAKE ONE (1) TABLET BY MOUTH EACH DAY 30 tablet 5  . atorvastatin (LIPITOR) 40 MG tablet TAKE ONE (1) TABLET BY MOUTH EVERY DAY AT 6PM. 30 tablet 11  . cholecalciferol (VITAMIN D) 1000 units tablet Take 1,000 Units by mouth daily.    . finasteride (PROSCAR) 5 MG tablet TAKE ONE (1) TABLET BY MOUTH EVERY DAY 30 tablet 2  . glucose blood (ONE TOUCH ULTRA TEST) test strip USE ONCE A DAY 100 each 12  . latanoprost (XALATAN) 0.005 % ophthalmic solution Place into both eyes at bedtime.    Marland Kitchen levothyroxine (SYNTHROID, LEVOTHROID) 75 MCG  tablet TAKE ONE (1) TABLET BY MOUTH EACH DAY 90 tablet 1  . loratadine (ALAVERT) 10 MG tablet Take 10 mg by mouth daily as needed for allergies.    . metFORMIN (GLUCOPHAGE-XR) 500 MG 24 hr  tablet TAKE 2 TABLETS BY MOUTH EACH MORNING WITH BREAKFAST 60 tablet 2  . Multiple Vitamin (MULTIVITAMIN WITH MINERALS) TABS tablet Take 1 tablet by mouth daily.    Glory Rosebush DELICA LANCETS 62E MISC USE TO CHECK BLOOD SUGAR ONCE DAILY 100 each 1  . tamsulosin (FLOMAX) 0.4 MG CAPS capsule TAKE ONE (1) CAPSULE EACH DAY BY MOUTH 30 capsule 2   No current facility-administered medications on file prior to visit.     BP (!) 153/53   Pulse (!) 34   Temp 98.1 F (36.7 C) (Oral)   Resp 16   Ht 6\' 2"  (1.88 m)   Wt 210 lb (95.3 kg)   SpO2 100%   BMI 26.96 kg/m       Objective:   Physical Exam  General- No acute distress. Pleasant patient. Neck- Full range of motion, no jvd Lungs- Clear, even and unlabored. Heart- regular but brady at rest. Neurologic- CNII- XII grossly intact.  Left axillary- much improved. Only one area mild hyperigmented with faint induration. No dc.       Assessment & Plan:  Your left axillary area skin infection looks much better.  There is only 1 of the previous areas that looks a little hyperpigmented and has some mild indurated/minimal hard feeling.  This may represent residual infection.  Will provide additional 3 days of doxycycline.  Thereafter should not need any further treatment.  You do have history of preventricular contractions on EKGs and some bradycardia recently.  Though on activity/walking in our office last week your pulse was in appropriate range.  Also when you did the EKG today your heart rate came up to 68.  You are asymptomatic but due to the intermittent bradycardia in the range of low 40s to mid 30 range, I am going to refer you back to your cardiologist.  If you were to get any cardiac type signs or symptoms and recommend ED evaluation.  To reduce PVCs  potentially recommend discontinuing or cutting back on caffeine.   Follow-up as regularly scheduled with pcp or as needed  [employer], PA-C

## 2018-08-02 ENCOUNTER — Other Ambulatory Visit: Payer: Self-pay | Admitting: Family Medicine

## 2018-08-07 ENCOUNTER — Ambulatory Visit (INDEPENDENT_AMBULATORY_CARE_PROVIDER_SITE_OTHER): Payer: Medicare Other | Admitting: Cardiology

## 2018-08-07 ENCOUNTER — Encounter: Payer: Self-pay | Admitting: Cardiology

## 2018-08-07 VITALS — BP 126/74 | HR 79 | Ht 74.0 in | Wt 205.0 lb

## 2018-08-07 DIAGNOSIS — I10 Essential (primary) hypertension: Secondary | ICD-10-CM | POA: Diagnosis not present

## 2018-08-07 DIAGNOSIS — E088 Diabetes mellitus due to underlying condition with unspecified complications: Secondary | ICD-10-CM

## 2018-08-07 DIAGNOSIS — I2510 Atherosclerotic heart disease of native coronary artery without angina pectoris: Secondary | ICD-10-CM | POA: Diagnosis not present

## 2018-08-07 DIAGNOSIS — Z951 Presence of aortocoronary bypass graft: Secondary | ICD-10-CM

## 2018-08-07 DIAGNOSIS — E782 Mixed hyperlipidemia: Secondary | ICD-10-CM

## 2018-08-07 DIAGNOSIS — I739 Peripheral vascular disease, unspecified: Secondary | ICD-10-CM | POA: Diagnosis not present

## 2018-08-07 DIAGNOSIS — E118 Type 2 diabetes mellitus with unspecified complications: Secondary | ICD-10-CM | POA: Insufficient documentation

## 2018-08-07 MED ORDER — NITROGLYCERIN 0.4 MG SL SUBL: 0.4000 mg | SUBLINGUAL_TABLET | SUBLINGUAL | 11 refills | Status: AC | PRN

## 2018-08-07 NOTE — Progress Notes (Signed)
Cardiology Office Note:    Date:  08/07/2018   ID:  Harry Brown, DOB 01/10/1941, MRN [id number]  PCP:  [name], MD  Cardiologist:  Jenean Lindau, MD   Referring MD: Mackie Pai, PA-C    ASSESSMENT:    1. Coronary artery disease involving native coronary artery of native heart without angina pectoris   2. Mixed hyperlipidemia   3. Essential hypertension   4. PVD (peripheral vascular disease) ([place])   5. Diabetes mellitus due to underlying condition with unspecified complications ([place])   6. Hx of CABG    PLAN:    In order of problems listed above:  1. Secondary prevention stressed with the patient.  Importance of compliance with diet and medication stressed and he vocalized understanding.  His blood pressure is stable.  Diet was discussed for diabetes mellitus.  The patient was recommended an exercise Cardiolite.  This will help me assess his PVCs on exertion.  On the morning of the test he will hold his atenolol medication.  Echocardiogram will be done to assess murmur heard on auscultation. 2. Sublingual nitroglycerin prescription was sent, its protocol and 911 protocol explained and the patient vocalized understanding questions were answered to the patient's satisfaction 3. Patient will be seen in follow-up appointment in a month or earlier if he has any concerns.   Medication Adjustments/Labs and Tests Ordered: Current medicines are reviewed at length with the patient today.  Concerns regarding medicines are outlined above.  No orders of the defined types were placed in this encounter.  No orders of the defined types were placed in this encounter.    History of Present Illness:    Harry Brown is a 78 y.o. male who is being seen today for the evaluation of dyspnea on exertion and bradycardia arrhythmias and PVCs at the request of Saguier, Percell Miller, [place].  Patient is a pleasant 78 year old male.  He has past medical history of coronary artery disease post  CABG surgery in the remote past, essential hypertension, diabetes mellitus and dyslipidemia.  He is referred because of some shortness of breath on exertion and fatigue.  He leads a sedentary lifestyle.  No orthopnea or PND.  At the time of my evaluation, the patient is alert awake oriented and in no distress.  Past Medical History:  Diagnosis Date  . Anemia 09/24/2013  . CAD (coronary artery disease)   . Cellulitis 05/14/2013   RLE  . Diverticulitis   . ED (erectile dysfunction)   . Glaucoma 08/18/2014  . History of sleep apnea    had surgery to correct  . Hypertension   . Hypothyroidism   . Medicare annual wellness visit, subsequent 04/13/2013  . Myocardial infarction ([place]) 11/05/1985  . Nocturia 03/15/2017  . Other and unspecified hyperlipidemia   . Penile lesion 02/24/2015  . Peripheral neuropathy 03/31/2012  . Peripheral vascular disease ([place])   . Postoperative anemia due to acute blood loss 09/24/2013  . Preventative health care 03/13/2016  . Sleep apnea 09/16/2017  . Type II diabetes mellitus (HCC)    fasting 100-120  . Urinary urgency 11/22/2012    Past Surgical History:  Procedure Laterality Date  . ABDOMINAL AORTAGRAM  Oct. 9, 2014   Dr. Bridgett Larsson  . ABDOMINAL AORTAGRAM N/A 03/15/2013   Procedure: ABDOMINAL Maxcine Ham;  Surgeon: Conrad Alma, MD;  Location: [facility] CATH LAB;  Service: Cardiovascular;  Laterality: N/A;  . ANTERIOR CERVICAL DECOMP/DISCECTOMY FUSION  ~ 2000  . CATARACT EXTRACTION Right   .  CORONARY ARTERY BYPASS GRAFT  2001   "CABG X3" (05/14/2013)  . DENTAL SURGERY     "multiple teeth removed; trmimed top of mouth so dentures would fit" (05/14/2013)  . EYE SURGERY     cataracts  . FEMORAL-POPLITEAL BYPASS GRAFT Right 04/25/2013   Procedure: RIGHT FEMORAL TO ABOVE KNEE POPLITEAL BYPASS GRAFT WITH RIGHT GREATER SAPHENOUS VEIN HARVEST; ULTRASOUND GUIDED;  Surgeon: Conrad [place], MD;  Location: [facility] OR;  Service: Vascular;  Laterality: Right;  . GUM SURGERY  [phone]   "had bone  scraped; had periodontal disease" (05/14/2013)  . PALATE / UVULA BIOPSY / EXCISION  2003   Removed due to sleep apnea  . TONSILLECTOMY      Current Medications: Current Meds  Medication Sig  . aspirin EC 81 MG tablet Take 81 mg by mouth at bedtime.  Marland Kitchen atenolol (TENORMIN) 25 MG tablet TAKE ONE (1) TABLET BY MOUTH EACH DAY  . atorvastatin (LIPITOR) 40 MG tablet TAKE ONE (1) TABLET BY MOUTH EVERY DAY AT 6PM.  . cholecalciferol (VITAMIN D) 1000 units tablet Take 1,000 Units by mouth daily.  Marland Kitchen doxycycline (VIBRA-TABS) 100 MG tablet Take 1 tablet (100 mg total) by mouth 2 (two) times daily. Can give caps or generic  . finasteride (PROSCAR) 5 MG tablet TAKE ONE (1) TABLET BY MOUTH EVERY DAY  . glucose blood (ONE TOUCH ULTRA TEST) test strip USE ONCE A DAY  . latanoprost (XALATAN) 0.005 % ophthalmic solution Place into both eyes at bedtime.  Marland Kitchen levothyroxine (SYNTHROID, LEVOTHROID) 75 MCG tablet TAKE ONE (1) TABLET BY MOUTH EACH DAY  . loratadine (ALAVERT) 10 MG tablet Take 10 mg by mouth daily as needed for allergies.  . metFORMIN (GLUCOPHAGE-XR) 500 MG 24 hr tablet TAKE 2 TABLETS BY MOUTH EACH MORNING WITH BREAKFAST  . Multiple Vitamin (MULTIVITAMIN WITH MINERALS) TABS tablet Take 1 tablet by mouth daily.  Glory Rosebush DELICA LANCETS 60Y MISC USE TO CHECK BLOOD SUGAR ONCE DAILY  . tamsulosin (FLOMAX) 0.4 MG CAPS capsule TAKE ONE (1) CAPSULE EACH DAY BY MOUTH     Allergies:   Adhesive [tape]; Latex; and Other   Social History   Socioeconomic History  . Marital status: Married    Spouse name: Not on file  . Number of children: 8   . Years of education: Not on file  . Highest education level: Not on file  Occupational History    Employer: [employer]  Social Needs  . Financial resource strain: Not on file  . Food insecurity:    Worry: Not on file    Inability: Not on file  . Transportation needs:    Medical: Not on file    Non-medical: Not on file  Tobacco Use  . Smoking  status: Former Smoker    Packs/day: 1.50    Years: 50.00    Pack years: 75.00    Types: Cigarettes  . Smokeless tobacco: Never Used  . Tobacco comment: 05/14/2013 "quit smoking in ~ 2004; still Uses Comit lozenges"  Substance and Sexual Activity  . Alcohol use: No    Alcohol/week: 0.0 standard drinks  . Drug use: No  . Sexual activity: Yes    Comment: lives with wife, retiring end of next week, no dietary restrictions  Lifestyle  . Physical activity:    Days per week: Not on file    Minutes per session: Not on file  . Stress: Not on file  Relationships  . Social connections:    Talks on  phone: Not on file    Gets together: Not on file    Attends religious service: Not on file    Active member of club or organization: Not on file    Attends meetings of clubs or organizations: Not on file    Relationship status: Not on file  Other Topics Concern  . 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 There is no history of Cancer.  ROS:   Please see the history of present illness.    All other systems reviewed and are negative.  EKGs/Labs/Other Studies Reviewed:    The following studies were reviewed today: I discussed my findings with the patient at extensive length.  EKG reveals sinus rhythm bradycardia and ventricular bigeminy.   Recent Labs: 05/02/2018: ALT 17; BUN 29; Creatinine, Ser 0.94; Hemoglobin 12.4; Platelets 213.0; Potassium 4.3; Sodium 139; TSH 3.95  Recent Lipid Panel    Component Value Date/Time   CHOL 113 05/02/2018 1053   TRIG 45.0 05/02/2018 1053   HDL 37.90 (L) 05/02/2018 1053   CHOLHDL 3 05/02/2018 1053   VLDL  9.0 05/02/2018 1053   LDLCALC 66 05/02/2018 1053    Physical Exam:    VS:  BP 126/74 (BP Location: Right Arm, Patient Position: Sitting, Cuff Size: Normal)   Pulse 79   Ht 6\' 2"  (1.88 m)   Wt 205 lb (93 kg)   SpO2 98%   BMI 26.32 kg/m     Wt Readings from Last 3 Encounters:  08/07/18 205 lb (93 kg)  07/31/18 210 lb (95.3 kg)  07/24/18 206 lb (93.4 kg)     GEN: Patient is in no acute distress HEENT: Normal NECK: No JVD; No carotid bruits LYMPHATICS: No lymphadenopathy CARDIAC: S1 S2 regular, 2/6 systolic murmur at the apex. RESPIRATORY:  Clear to auscultation without rales, wheezing or rhonchi  ABDOMEN: Soft, non-tender, non-distended MUSCULOSKELETAL:  No edema; No deformity  SKIN: Warm and dry NEUROLOGIC:  Alert and oriented x 3 PSYCHIATRIC:  Normal affect    Signed, Jenean Lindau, MD  08/07/2018 2:57 PM    [facility]

## 2018-08-07 NOTE — Patient Instructions (Signed)
Medication Instructions:  Your physician has recommended you make the following change in your medication:  Take as needed for chest pain:  Nitroglycerin 0.4 mg sublingual (under your tongue) as needed for chest pain. If experiencing chest pain, stop what you are doing and sit down. Take 1 nitroglycerin and wait 5 minutes. If chest pain continues, take another nitroglycerin and wait 5 minutes. If chest pain does not subside, take 1 more nitroglycerin and dial 911. You make take a total of 3 nitroglycerin in a 15 minute time frame.  If you need a refill on your cardiac medications before your next appointment, please call your pharmacy.   Lab work: None.  If you have labs (blood work) drawn today and your tests are completely normal, you will receive your results only by: Marland Kitchen MyChart Message (if you have MyChart) OR . A paper copy in the mail If you have any lab test that is abnormal or we need to change your treatment, we will call you to review the results.  Testing/Procedures: Your physician has requested that you have an echocardiogram. Echocardiography is a painless test that uses sound waves to create images of your heart. It provides your doctor with information about the size and shape of your heart and how well your heart's chambers and valves are working. This procedure takes approximately one hour. There are no restrictions for this procedure.  Your physician has requested that you have en exercise stress myoview. For further information please visit [email or web address]. Please follow instruction sheet, as given.    Follow-Up: At [facility], you and your health needs are our priority.  As part of our continuing mission to provide you with exceptional heart care, we have created designated Provider Care Teams.  These Care Teams include your primary Cardiologist (physician) and Advanced Practice Providers (APPs -  Physician Assistants and Nurse Practitioners) who all work together to  provide you with the care you need, when you need it. You will need a follow up appointment in 1 months.  Please call our office 2 months in advance to schedule this appointment.  You may see No primary care provider on file. or another member of our [employer] in [place]: Jenne Campus, MD . Shirlee More, MD  Any Other Special Instructions Will Be Listed Below (If Applicable).   Echocardiogram An echocardiogram is a procedure that uses painless sound waves (ultrasound) to produce an image of the heart. Images from an echocardiogram can provide important information about:  Signs of coronary artery disease (CAD).  Aneurysm detection. An aneurysm is a weak or damaged part of an artery wall that bulges out from the normal force of blood pumping through the body.  Heart size and shape. Changes in the size or shape of the heart can be associated with certain conditions, including heart failure, aneurysm, and CAD.  Heart muscle function.  Heart valve function.  Signs of a past heart attack.  Fluid buildup around the heart.  Thickening of the heart muscle.  A tumor or infectious growth around the heart valves. Tell a health care provider about:  Any allergies you have.  All medicines you are taking, including vitamins, herbs, eye drops, creams, and over-the-counter medicines.  Any blood disorders you have.  Any surgeries you have had.  Any medical conditions you have.  Whether you are pregnant or may be pregnant. What are the risks? Generally, this is a safe procedure. However, problems may occur, including:  Allergic reaction to  dye (contrast) that may be used during the procedure. What happens before the procedure? No specific preparation is needed. You may eat and drink normally. What happens during the procedure?   An IV tube may be inserted into one of your veins.  You may receive contrast through this tube. A contrast is an injection that  improves the quality of the pictures from your heart.  A gel will be applied to your chest.  A wand-like tool (transducer) will be moved over your chest. The gel will help to transmit the sound waves from the transducer.  The sound waves will harmlessly bounce off of your heart to allow the heart images to be captured in real-time motion. The images will be recorded on a computer. The procedure may vary among health care providers and hospitals. What happens after the procedure?  You may return to your normal, everyday life, including diet, activities, and medicines, unless your health care provider tells you not to do that. Summary  An echocardiogram is a procedure that uses painless sound waves (ultrasound) to produce an image of the heart.  Images from an echocardiogram can provide important information about the size and shape of your heart, heart muscle function, heart valve function, and fluid buildup around your heart.  You do not need to do anything to prepare before this procedure. You may eat and drink normally.  After the echocardiogram is completed, you may return to your normal, everyday life, unless your health care provider tells you not to do that. This information is not intended to replace advice given to you by your health care provider. Make sure you discuss any questions you have with your health care provider. Document Released: 05/21/2000 Document Revised: 06/26/2016 Document Reviewed: 06/26/2016 Elsevier Interactive Patient Education  2019 [employer].   Cardiac Nuclear Scan A cardiac nuclear scan is a test that measures blood flow to the heart when a person is resting and when he or she is exercising. The test looks for problems such as:  Not enough blood reaching a portion of the heart.  The heart muscle not working normally. You may need this test if:  You have heart disease.  You have had abnormal lab results.  You have had heart surgery or a balloon  procedure to open up blocked arteries (angioplasty).  You have chest pain.  You have shortness of breath. In this test, a radioactive dye (tracer) is injected into your bloodstream. After the tracer has traveled to your heart, an imaging device is used to measure how much of the tracer is absorbed by or distributed to various areas of your heart. This procedure is usually done at a hospital and takes 2-4 hours. Tell a health care provider about:  Any allergies you have.  All medicines you are taking, including vitamins, herbs, eye drops, creams, and over-the-counter medicines.  Any problems you or family members have had with anesthetic medicines.  Any blood disorders you have.  Any surgeries you have had.  Any medical conditions you have.  Whether you are pregnant or may be pregnant. What are the risks? Generally, this is a safe procedure. However, problems may occur, including:  Serious chest pain and heart attack. This is only a risk if the stress portion of the test is done.  Rapid heartbeat.  Sensation of warmth in your chest. This usually passes quickly.  Allergic reaction to the tracer. What happens before the procedure?  Ask your health care provider about changing or stopping  your regular medicines. This is especially important if you are taking diabetes medicines or blood thinners.  Follow instructions from your health care provider about eating or drinking restrictions.  Remove your jewelry on the day of the procedure. What happens during the procedure?  An IV will be inserted into one of your veins.  Your health care provider will inject a small amount of radioactive tracer through the IV.  You will wait for 20-40 minutes while the tracer travels through your bloodstream.  Your heart activity will be monitored with an electrocardiogram (ECG).  You will lie down on an exam table.  Images of your heart will be taken for about 15-20 minutes.  You may also  have a stress test. For this test, one of the following may be done: ? You will exercise on a treadmill or stationary bike. While you exercise, your heart's activity will be monitored with an ECG, and your blood pressure will be checked. ? You will be given medicines that will increase blood flow to parts of your heart. This is done if you are unable to exercise.  When blood flow to your heart has peaked, a tracer will again be injected through the IV.  After 20-40 minutes, you will get back on the exam table and have more images taken of your heart.  Depending on the type of tracer used, scans may need to be repeated 3-4 hours later.  Your IV line will be removed when the procedure is over. The procedure may vary among health care providers and hospitals. What happens after the procedure?  Unless your health care provider tells you otherwise, you may return to your normal schedule, including diet, activities, and medicines.  Unless your health care provider tells you otherwise, you may increase your fluid intake. This will help to flush the contrast dye from your body. Drink enough fluid to keep your urine pale yellow.  Ask your health care provider, or the department that is doing the test: ? When will my results be ready? ? How will I get my results? Summary  A cardiac nuclear scan measures the blood flow to the heart when a person is resting and when he or she is exercising.  Tell your health care provider if you are pregnant.  Before the procedure, ask your health care provider about changing or stopping your regular medicines. This is especially important if you are taking diabetes medicines or blood thinners.  After the procedure, unless your health care provider tells you otherwise, increase your fluid intake. This will help flush the contrast dye from your body.  After the procedure, unless your health care provider tells you otherwise, you may return to your normal schedule,  including diet, activities, and medicines. This information is not intended to replace advice given to you by your health care provider. Make sure you discuss any questions you have with your health care provider. Document Released: 06/18/2004 Document Revised: 11/07/2017 Document Reviewed: 11/07/2017 Elsevier Interactive Patient Education  2019 [employer].

## 2018-08-08 ENCOUNTER — Telehealth (HOSPITAL_COMMUNITY): Payer: Self-pay | Admitting: *Deleted

## 2018-08-08 NOTE — Telephone Encounter (Signed)
Patient given detailed instructions per Myocardial Perfusion Study Information Sheet for the test on 08/09/18. Patient notified to arrive 15 minutes early and that it is imperative to arrive on time for appointment to keep from having the test rescheduled.  If you need to cancel or reschedule your appointment, please call the office within 24 hours of your appointment. . Patient verbalized understanding. Kirstie Peri

## 2018-08-09 ENCOUNTER — Ambulatory Visit (HOSPITAL_BASED_OUTPATIENT_CLINIC_OR_DEPARTMENT_OTHER): Payer: Medicare Other

## 2018-08-09 VITALS — Ht 74.0 in | Wt 205.0 lb

## 2018-08-09 DIAGNOSIS — E088 Diabetes mellitus due to underlying condition with unspecified complications: Secondary | ICD-10-CM

## 2018-08-09 DIAGNOSIS — I2510 Atherosclerotic heart disease of native coronary artery without angina pectoris: Secondary | ICD-10-CM

## 2018-08-09 DIAGNOSIS — I739 Peripheral vascular disease, unspecified: Secondary | ICD-10-CM

## 2018-08-09 LAB — MYOCARDIAL PERFUSION IMAGING
Estimated workload: 4.6 METS
Exercise duration (min): 4 min
Exercise duration (sec): 39 s
MPHR: 142 {beats}/min
Peak HR: 144 {beats}/min
Percent HR: 101 %
Rest HR: 76 {beats}/min
SDS: 4
SRS: 0
SSS: 4
TID: 1.02

## 2018-08-09 MED ORDER — TECHNETIUM TC 99M TETROFOSMIN IV KIT
33.0000 | PACK | Freq: Once | INTRAVENOUS | Status: AC | PRN
  Administered 2018-08-09: 33 via INTRAVENOUS
  Filled 2018-08-09: qty 33

## 2018-08-09 MED ORDER — TECHNETIUM TC 99M TETROFOSMIN IV KIT
11.0000 | PACK | Freq: Once | INTRAVENOUS | Status: AC | PRN
  Administered 2018-08-09: 11 via INTRAVENOUS
  Filled 2018-08-09: qty 11

## 2018-08-10 ENCOUNTER — Ambulatory Visit (HOSPITAL_BASED_OUTPATIENT_CLINIC_OR_DEPARTMENT_OTHER)
Admission: RE | Admit: 2018-08-10 | Discharge: 2018-08-10 | Disposition: A | Discharge status: Home / Self Care | Payer: Medicare Other | Source: Ambulatory Visit | Attending: Cardiology | Admitting: Cardiology

## 2018-08-10 DIAGNOSIS — I2510 Atherosclerotic heart disease of native coronary artery without angina pectoris: Secondary | ICD-10-CM | POA: Insufficient documentation

## 2018-08-10 DIAGNOSIS — I739 Peripheral vascular disease, unspecified: Secondary | ICD-10-CM | POA: Diagnosis not present

## 2018-08-10 DIAGNOSIS — E088 Diabetes mellitus due to underlying condition with unspecified complications: Secondary | ICD-10-CM | POA: Insufficient documentation

## 2018-08-10 NOTE — Progress Notes (Signed)
  Echocardiogram 2D Echocardiogram has been performed.  Charletta Voight T Bleu Minerd 08/10/2018, 3:59 PM

## 2018-08-14 ENCOUNTER — Telehealth: Payer: Self-pay | Admitting: Emergency Medicine

## 2018-08-14 DIAGNOSIS — I10 Essential (primary) hypertension: Secondary | ICD-10-CM

## 2018-08-14 DIAGNOSIS — I714 Abdominal aortic aneurysm, without rupture, unspecified: Secondary | ICD-10-CM

## 2018-08-14 NOTE — Telephone Encounter (Signed)
Patient informed of results from echocardiogram. Patient advised per Dr. Geraldo Pitter to have a ct chest done and to have labs drawn 3-7 days before patient verbally understands and I have contact schedulers at [place].

## 2018-08-15 DIAGNOSIS — I10 Essential (primary) hypertension: Secondary | ICD-10-CM | POA: Diagnosis not present

## 2018-08-15 DIAGNOSIS — I714 Abdominal aortic aneurysm, without rupture: Secondary | ICD-10-CM | POA: Diagnosis not present

## 2018-08-16 ENCOUNTER — Other Ambulatory Visit: Payer: Self-pay

## 2018-08-16 ENCOUNTER — Ambulatory Visit (HOSPITAL_BASED_OUTPATIENT_CLINIC_OR_DEPARTMENT_OTHER)
Admission: RE | Admit: 2018-08-16 | Discharge: 2018-08-16 | Disposition: A | Discharge status: Home / Self Care | Payer: Medicare Other | Source: Ambulatory Visit | Attending: Cardiology | Admitting: Cardiology

## 2018-08-16 DIAGNOSIS — I714 Abdominal aortic aneurysm, without rupture: Secondary | ICD-10-CM | POA: Insufficient documentation

## 2018-08-16 DIAGNOSIS — R0602 Shortness of breath: Secondary | ICD-10-CM | POA: Diagnosis not present

## 2018-08-16 LAB — BASIC METABOLIC PANEL
BUN/Creatinine Ratio: 20 (ref 10–24)
BUN: 23 mg/dL (ref 8–27)
CO2: 22 mmol/L (ref 20–29)
Calcium: 9 mg/dL (ref 8.6–10.2)
Chloride: 105 mmol/L (ref 96–106)
Creatinine, Ser: 1.13 mg/dL (ref 0.76–1.27)
GFR calc Af Amer: 72 mL/min/{1.73_m2} (ref >59)
GFR calc non Af Amer: 62 mL/min/{1.73_m2} (ref >59)
Glucose: 115 mg/dL — ABNORMAL HIGH (ref 65–99)
Potassium: 4.6 mmol/L (ref 3.5–5.2)
Sodium: 142 mmol/L (ref 134–144)

## 2018-08-16 MED ORDER — IOPAMIDOL (ISOVUE-370) INJECTION 76%
100.0000 mL | Freq: Once | INTRAVENOUS | Status: AC | PRN
  Administered 2018-08-16: 100 mL via INTRAVENOUS

## 2018-08-17 ENCOUNTER — Telehealth: Payer: Self-pay

## 2018-08-17 NOTE — Telephone Encounter (Signed)
-----   Message from Jenean Lindau, MD sent at 08/16/2018  9:07 AM EDT ----- The results of the study is unremarkable. Please inform patient. I will discuss in detail at next appointment. Cc  primary care/referring physician Jenean Lindau, MD 08/16/2018 9:07 AM

## 2018-08-17 NOTE — Telephone Encounter (Signed)
Patient called and notified of lab results. 

## 2018-08-17 NOTE — Telephone Encounter (Signed)
The patient has been notified of the result and verbalized understanding.  All questions were answered. Patient states he and wife will discuss further on 09/18/18 if more questions arise. Copy of results sent to Dr. Frederik Pear per Dr. Docia Furl request.

## 2018-08-22 DIAGNOSIS — E782 Mixed hyperlipidemia: Secondary | ICD-10-CM

## 2018-08-23 ENCOUNTER — Other Ambulatory Visit: Payer: Self-pay | Admitting: Family Medicine

## 2018-08-23 ENCOUNTER — Telehealth: Payer: Self-pay

## 2018-08-23 NOTE — Telephone Encounter (Signed)
Charlett Nose ( pt wife) called with concerns about CT results. Information relayed and she was instructed to bring him in for follow up labs week of 09/06/18. Charlett Nose concerned about pts atenolol and possibly discontinuing this medication. RN advised her to intermittently start checking pt HR and call if lower than 60 consistently. No further questions at this time.

## 2018-09-08 DIAGNOSIS — E782 Mixed hyperlipidemia: Secondary | ICD-10-CM | POA: Diagnosis not present

## 2018-09-09 LAB — LIPID PANEL
Chol/HDL Ratio: 2.7 ratio (ref 0.0–5.0)
Cholesterol, Total: 122 mg/dL (ref 100–199)
HDL: 45 mg/dL (ref >39)
LDL Calculated: 67 mg/dL (ref 0–99)
Triglycerides: 48 mg/dL (ref 0–149)
VLDL Cholesterol Cal: 10 mg/dL (ref 5–40)

## 2018-09-11 ENCOUNTER — Other Ambulatory Visit: Payer: Self-pay

## 2018-09-11 NOTE — Progress Notes (Unsigned)
Verbal request sent

## 2018-09-12 ENCOUNTER — Telehealth: Payer: Self-pay

## 2018-09-12 NOTE — Telephone Encounter (Signed)
Left vm that all labwork was good, if patient needs further information call office. Copy of results sent to Dr.Blyth office per Dr.RRR request.

## 2018-09-14 ENCOUNTER — Telehealth: Payer: Self-pay | Admitting: Cardiology

## 2018-09-14 NOTE — Telephone Encounter (Signed)
Mychart message sent.

## 2018-09-15 ENCOUNTER — Encounter: Payer: Self-pay | Admitting: Cardiology

## 2018-09-15 ENCOUNTER — Telehealth (INDEPENDENT_AMBULATORY_CARE_PROVIDER_SITE_OTHER): Payer: Medicare Other | Admitting: Cardiology

## 2018-09-15 ENCOUNTER — Other Ambulatory Visit: Payer: Self-pay

## 2018-09-15 VITALS — BP 123/63 | HR 60 | Ht 74.0 in | Wt 203.0 lb

## 2018-09-15 DIAGNOSIS — E088 Diabetes mellitus due to underlying condition with unspecified complications: Secondary | ICD-10-CM

## 2018-09-15 DIAGNOSIS — G4730 Sleep apnea, unspecified: Secondary | ICD-10-CM

## 2018-09-15 DIAGNOSIS — I10 Essential (primary) hypertension: Secondary | ICD-10-CM | POA: Diagnosis not present

## 2018-09-15 DIAGNOSIS — I739 Peripheral vascular disease, unspecified: Secondary | ICD-10-CM

## 2018-09-15 DIAGNOSIS — E782 Mixed hyperlipidemia: Secondary | ICD-10-CM

## 2018-09-15 NOTE — Patient Instructions (Signed)
RN called and discussed information listed below on 09/15/18 at 1530  Medication Instructions: Your physician recommends that you continue on your current medications as directed. Please refer to the Current Medication list given to you today.  If you need a refill on your cardiac medications before your next appointment, please call your pharmacy.   Lab work: NONE If you have labs (blood work) drawn today and your tests are completely normal, you will receive your results only by: Marland Kitchen MyChart Message (if you have MyChart) OR . A paper copy in the mail If you have any lab test that is abnormal or we need to change your treatment, we will call you to review the results.  Testing/Procedures: NONE  Follow-Up: At [facility], you and your health needs are our priority.  As part of our continuing mission to provide you with exceptional heart care, we have created designated Provider Care Teams.  These Care Teams include your primary Cardiologist (physician) and Advanced Practice Providers (APPs -  Physician Assistants and Nurse Practitioners) who all work together to provide you with the care you need, when you need it. You will need a follow up appointment in 4 months.

## 2018-09-15 NOTE — Progress Notes (Signed)
Virtual Visit via Video Note   This visit type was conducted due to national recommendations for restrictions regarding the COVID-19 Pandemic (e.g. social distancing) in an effort to limit this patient's exposure and mitigate transmission in our community.  Due to his co-morbid illnesses, this patient is at least at moderate risk for complications without adequate follow up.  This format is felt to be most appropriate for this patient at this time.  All issues noted in this document were discussed and addressed.  A limited physical exam was performed with this format.  Please refer to the patient's chart for his consent to telehealth for [facility]   Evaluation Performed:  Follow-up visit  Date:  09/15/2018   ID:  Donatello, Kleve [birth year], MRN [id number]  Patient Location: Home  Provider Location: Home  PCP:  [name], MD  Cardiologist:  No primary care provider on file.  Electrophysiologist:  None   Chief Complaint: Coronary artery disease  History of Present Illness:    Harry Brown is a 78 y.o. male who presents via audio/video conferencing for a telehealth visit today.    Patient is a pleasant 78 year old male.  He has past medical history of coronary artery disease and peripheral vascular disease.  He denies any problems at this time and takes care of activities of daily living.  No chest pain orthopnea or PND.  He leads a fairly sedentary lifestyle and walks some but not on a regular basis.  His wife accompanies him on the video conference and is very supportive.  At the time of my evaluation, the patient is alert awake oriented and in no distress.  The patient does not have symptoms concerning for COVID-19 infection (fever, chills, cough, or new shortness of breath).    Past Medical History:  Diagnosis Date  . Anemia 09/24/2013  . CAD (coronary artery disease)   . Cellulitis 05/14/2013   RLE  . Diverticulitis   . ED (erectile dysfunction)   .  Glaucoma 08/18/2014  . History of sleep apnea    had surgery to correct  . Hypertension   . Hypothyroidism   . Medicare annual wellness visit, subsequent 04/13/2013  . Myocardial infarction ([place]) 11/05/1985  . Nocturia 03/15/2017  . Other and unspecified hyperlipidemia   . Penile lesion 02/24/2015  . Peripheral neuropathy 03/31/2012  . Peripheral vascular disease ([place])   . Postoperative anemia due to acute blood loss 09/24/2013  . Preventative health care 03/13/2016  . Sleep apnea 09/16/2017  . Type II diabetes mellitus (H[facility])    fasting 100-120  . Urinary urgency 11/22/2012   Past Surgical History:  Procedure Laterality Date  . ABDOMINAL AORTAGRAM  Oct. 9, 2014   Dr. Bridgett Larsson  . ABDOMINAL AORTAGRAM N/A 03/15/2013   Procedure: ABDOMINAL Maxcine Ham;  Surgeon: Conrad Cherryville, MD;  Location[facility] C[facility] LAB;  Service: Cardiovascular;  Laterality: N/A;  . ANTERIOR CERVICAL DECOMP/DISCECTOMY FUSION  ~ 2000  . CATARACT EXTRACTION Right   . CORONARY ARTERY BYPASS GRAFT  2001   "CABG X3" (05/14/2013)  . DENTAL SURGERY     "multiple teeth removed; trmimed top of mouth so dentures would fit" (05/14/2013)  . EYE SURGERY     cataracts  . FEMORAL-POPLITEAL BYPASS GRAFT Right 04/25/2013   Procedure: RIGHT FEMORAL TO ABOVE KNEE POPLITEAL BYPASS GRAFT WITH RIGHT GREATER SAPHENOUS VEIN HARVEST; ULTRASOUND GUIDED;  Surgeon: Conrad Mount Morris, MD;  Location: [place];  Service: Vascular;  Laterality: Right;  . GUM SURGERY  1990's   "had bone scraped; had periodontal disease" (05/14/2013)  . PALATE / UVULA BIOPSY / EXCISION  2003   Removed due to sleep apnea  . TONSILLECTOMY       Current Meds  Medication Sig  . aspirin EC 81 MG tablet Take 81 mg by mouth at bedtime.  Marland Kitchen atenolol (TENORMIN) 25 MG tablet TAKE ONE (1) TABLET BY MOUTH EACH DAY  . atorvastatin (LIPITOR) 40 MG tablet TAKE ONE (1) TABLET BY MOUTH EVERY DAY AT 6PM  . cholecalciferol (VITAMIN D) 1000 units tablet Take 1,000 Units by mouth daily.  . finasteride  (PROSCAR) 5 MG tablet TAKE ONE (1) TABLET BY MOUTH EVERY DAY  . glucose blood (ONE TOUCH ULTRA TEST) test strip USE ONCE A DAY  . latanoprost (XALATAN) 0.005 % ophthalmic solution Place into both eyes at bedtime.  Marland Kitchen levothyroxine (SYNTHROID, LEVOTHROID) 75 MCG tablet TAKE ONE (1) TABLET BY MOUTH EACH DAY  . loratadine (ALAVERT) 10 MG tablet Take 10 mg by mouth daily as needed for allergies.  . metFORMIN (GLUCOPHAGE-XR) 500 MG 24 hr tablet TAKE 2 TABLETS BY MOUTH EACH MORNING WITH BREAKFAST  . Multiple Vitamin (MULTIVITAMIN WITH MINERALS) TABS tablet Take 1 tablet by mouth daily.  . nitroGLYCERIN (NITROSTAT) 0.4 MG SL tablet Place 1 tablet (0.4 mg total) under the tongue every 5 (five) minutes as needed for chest pain.  Glory Rosebush DELICA LANCETS 31S MISC USE TO CHECK BLOOD SUGAR ONCE DAILY  . tamsulosin (FLOMAX) 0.4 MG CAPS capsule TAKE ONE (1) CAPSULE EACH DAY BY MOUTH     Allergies:   Adhesive [tape]; Latex; and Other   Social History   Tobacco Use  . Smoking status: Former Smoker    Packs/day: 1.50    Years: 50.00    Pack years: 75.00    Types: Cigarettes  . Smokeless tobacco: Never Used  . Tobacco comment: 05/14/2013 "quit smoking in ~ 2004; still Uses Comit lozenges"  Substance Use Topics  . Alcohol use: No    Alcohol/week: 0.0 standard drinks  . Drug use: No     Family Hx: The patient's family history includes Arthritis in his father; Asthma in his father; Diabetes in his mother; Heart disease in his brother, brother, father, mother, sister, and sister; Hyperlipidemia in his mother; Hypertension in his mother; Lupus in his sister. There is no history of Cancer.  ROS:   Please see the history of present illness.    No chest pain orthopnea or PND  All other systems reviewed and are negative.   Prior CV studies:   The following studies were reviewed today:  I reviewed lab work with the patient at extensive length  Labs/Other Tests and Data Reviewed:    EKG:  No ECG  reviewed.  Recent Labs: 05/02/2018: ALT 17; Hemoglobin 12.4; Platelets 213.0; TSH 3.95 08/15/2018: BUN 23; Creatinine, Ser 1.13; Potassium 4.6; Sodium 142   Recent Lipid Panel Lab Results  Component Value Date/Time   CHOL 122 09/08/2018 09:04 AM   TRIG 48 09/08/2018 09:04 AM   HDL 45 09/08/2018 09:04 AM   CHOLHDL 2.7 09/08/2018 09:04 AM   CHOLHDL 3 05/02/2018 10:53 AM   LDLCALC 67 09/08/2018 09:04 AM    Wt Readings from Last 3 Encounters:  09/15/18 203 lb (92.1 kg)  08/09/18 205 lb (93 kg)  08/07/18 205 lb (93 kg)     Objective:    Vital Signs:  BP 123/63 (BP Location: Left Arm, Patient Position: Sitting, Cuff Size: Normal)  Pulse 60   Ht 6\' 2"  (1.88 m)   Wt 203 lb (92.1 kg)   BMI 26.06 kg/m    Well nourished, well developed male in no acute distress. Denies any chest pain orthopnea or PND and does some walking on a regular basis though not optimal  ASSESSMENT & PLAN:    1. Coronary artery disease: Secondary prevention stressed with the patient.  Importance of compliance with diet and medication stressed and he vocalized understanding.  His blood pressure is stable. 2. Diet was discussed for dyslipidemia and his lipids were reviewed with him at extensive length. 3. Importance of regular walking and exercise stressed to him.  He has peripheral vascular disease and I told him the importance of this from a peripheral vascular disease standpoint also 4. Patient will be seen in follow-up appointment in 4 months or earlier if the patient has any concerns   COVID-19 Education: The signs and symptoms of COVID-19 were discussed with the patient and how to seek care for testing (follow up with PCP or arrange E-visit).  The importance of social distancing was discussed today.  Time:   Today, I have spent 17 minutes with the patient with telehealth technology discussing the above problems.     Medication Adjustments/Labs and Tests Ordered: Current medicines are reviewed at  length with the patient today.  Concerns regarding medicines are outlined above.  Tests Ordered: No orders of the defined types were placed in this encounter.  Medication Changes: No orders of the defined types were placed in this encounter.   Disposition:  Follow up in 3 month(s)  Signed, Jenean Lindau, MD  09/15/2018 2:33 PM    [place]

## 2018-09-18 ENCOUNTER — Ambulatory Visit: Payer: Medicare Other | Admitting: Cardiology

## 2018-10-02 ENCOUNTER — Other Ambulatory Visit: Payer: Self-pay | Admitting: Family Medicine

## 2018-10-12 ENCOUNTER — Telehealth: Payer: Self-pay

## 2018-10-12 NOTE — Telephone Encounter (Signed)
Left message about results on pt cell, instructed to call if he has questions. Copy of results sent to Dr. Charlett Blake per Dr.RRR request.

## 2018-10-12 NOTE — Telephone Encounter (Signed)
-----   Message from Jenean Lindau, MD sent at 10/04/2018  8:30 AM EDT ----- The results of the study is unremarkable. Please inform patient. I will discuss in detail at next appointment. Cc  primary care/referring physician Jenean Lindau, MD 10/04/2018 8:30 AM

## 2018-10-31 ENCOUNTER — Other Ambulatory Visit: Payer: Self-pay

## 2018-10-31 ENCOUNTER — Ambulatory Visit (INDEPENDENT_AMBULATORY_CARE_PROVIDER_SITE_OTHER): Payer: Medicare Other | Admitting: Family Medicine

## 2018-10-31 DIAGNOSIS — M25512 Pain in left shoulder: Secondary | ICD-10-CM

## 2018-10-31 DIAGNOSIS — I10 Essential (primary) hypertension: Secondary | ICD-10-CM

## 2018-10-31 DIAGNOSIS — E088 Diabetes mellitus due to underlying condition with unspecified complications: Secondary | ICD-10-CM

## 2018-10-31 DIAGNOSIS — M25511 Pain in right shoulder: Secondary | ICD-10-CM

## 2018-10-31 DIAGNOSIS — Z7189 Other specified counseling: Secondary | ICD-10-CM

## 2018-10-31 DIAGNOSIS — E039 Hypothyroidism, unspecified: Secondary | ICD-10-CM | POA: Diagnosis not present

## 2018-10-31 NOTE — Progress Notes (Signed)
Virtual Visit via Telephone Note  I connected with Harry Brown on 10/31/18 at  9:20 AM EDT by a telephone enabled telemedicine application and verified that I am speaking with the correct person using two identifiers. Harry Brown, CMA attempted to get patient set up on video visit but was unable so visit was completed via telephone  Location: Patient: home Provider: home   I discussed the limitations of evaluation and management by telemedicine and the availability of in person appointments. The patient expressed understanding and agreed to proceed.    Subjective:    Patient ID: Harry Brown, male    DOB: 02-26-1941, 78 y.o.   MRN: [id number]  No chief complaint on file.   HPI Patient is in today for follow-up on chronic medical concerns including hypothyroidism, hyperlipidemia, hypertension and diabetes.  He feels well.  He is maintaining quarantine well.  Goes out infrequently and wears his mask with good hand hygiene when he does.  No recent febrile illness or hospitalizations.  No acute concerns other than bilateral shoulder pain.  The pain started 2 to 3 months ago he denies any falls or trauma.  He denies any neck pain or radicular symptoms into the hands but does note the pain runs from his shoulders to almost his elbows.  It is most notable with rotation of his arms to the extent that it is difficult to get his shirts on and off at this time.  He has not taken any significant medications or sought treatment for this so far. Denies CP/palp/SOB/HA/congestion/fevers/GI or GU c/o. Taking meds as prescribed  Past Medical History:  Diagnosis Date  . Anemia 09/24/2013  . CAD (coronary artery disease)   . Cellulitis 05/14/2013   RLE  . Diverticulitis   . ED (erectile dysfunction)   . Glaucoma 08/18/2014  . History of sleep apnea    had surgery to correct  . Hypertension   . Hypothyroidism   . Medicare annual wellness visit, subsequent 04/13/2013  . Myocardial infarction  ([place]) 11/05/1985  . Nocturia 03/15/2017  . Other and unspecified hyperlipidemia   . Penile lesion 02/24/2015  . Peripheral neuropathy 03/31/2012  . Peripheral vascular disease ([place])   . Postoperative anemia due to acute blood loss 09/24/2013  . Preventative health care 03/13/2016  . Sleep apnea 09/16/2017  . Type II diabetes mellitus (HCC)    fasting 100-120  . Urinary urgency 11/22/2012    Past Surgical History:  Procedure Laterality Date  . ABDOMINAL AORTAGRAM  Oct. 9, 2014   Dr. Bridgett Larsson  . ABDOMINAL AORTAGRAM N/A 03/15/2013   Procedure: ABDOMINAL Maxcine Ham;  Surgeon: Conrad Kinnelon, MD;  Location: [facility] CATH LAB;  Service: Cardiovascular;  Laterality: N/A;  . ANTERIOR CERVICAL DECOMP/DISCECTOMY FUSION  ~ 2000  . CATARACT EXTRACTION Right   . CORONARY ARTERY BYPASS GRAFT  2001   "CABG X3" (05/14/2013)  . DENTAL SURGERY     "multiple teeth removed; trmimed top of mouth so dentures would fit" (05/14/2013)  . EYE SURGERY     cataracts  . FEMORAL-POPLITEAL BYPASS GRAFT Right 04/25/2013   Procedure: RIGHT FEMORAL TO ABOVE KNEE POPLITEAL BYPASS GRAFT WITH RIGHT GREATER SAPHENOUS VEIN HARVEST; ULTRASOUND GUIDED;  Surgeon: Conrad Ross, MD;  Location: [facility] OR;  Service: Vascular;  Laterality: Right;  . GUM SURGERY  [phone]   "had bone scraped; had periodontal disease" (05/14/2013)  . PALATE / UVULA BIOPSY / EXCISION  2003   Removed due to sleep apnea  . TONSILLECTOMY  Family History  Problem Relation Age of Onset  . Hyperlipidemia Mother   . Heart disease Mother   . Diabetes Mother   . Hypertension Mother   . Heart disease Father   . Asthma Father   . Arthritis Father   . Heart disease Sister   . Heart disease Sister        s/p bypass x 2  . Lupus Sister   . Heart disease Brother        MI waiting on heart transplant when died  . Heart disease Brother        massive MI  . Cancer Neg Hx     Social History   Socioeconomic History  . Marital status: Married    Spouse name: Not on  file  . Number of children: 8   . Years of education: Not on file  . Highest education level: Not on file  Occupational History    Employer: [employer]  Social Needs  . Financial resource strain: Not on file  . Food insecurity:    Worry: Not on file    Inability: Not on file  . Transportation needs:    Medical: Not on file    Non-medical: Not on file  Tobacco Use  . Smoking status: Former Smoker    Packs/day: 1.50    Years: 50.00    Pack years: 75.00    Types: Cigarettes  . Smokeless tobacco: Never Used  . Tobacco comment: 05/14/2013 "quit smoking in ~ 2004; still Uses Comit lozenges"  Substance and Sexual Activity  . Alcohol use: No    Alcohol/week: 0.0 standard drinks  . Drug use: No  . Sexual activity: Yes    Comment: lives with wife, retiring end of next week, no dietary restrictions  Lifestyle  . Physical activity:    Days per week: Not on file    Minutes per session: Not on file  . Stress: Not on file  Relationships  . Social connections:    Talks on phone: Not on file    Gets together: Not on file    Attends religious service: Not on file    Active member of club or organization: Not on file    Attends meetings of clubs or organizations: Not on file    Relationship status: Not on file  . Intimate partner violence:    Fear of current or ex partner: Not on file    Emotionally abused: Not on file    Physically abused: Not on file    Forced sexual activity: Not on file  Other Topics Concern  . 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 aspirin EC 81 MG tablet Take 81 mg by mouth at bedtime.    Marland Kitchen atenolol (TENORMIN) 25 MG tablet TAKE ONE (1) TABLET BY MOUTH EACH DAY 30 tablet 5  . atorvastatin (LIPITOR) 40 MG tablet TAKE ONE (1) TABLET BY MOUTH EVERY DAY AT 6PM 30 tablet 11  . cholecalciferol (VITAMIN D)  1000 units tablet Take 1,000 Units by mouth daily.    . finasteride (PROSCAR) 5 MG tablet TAKE ONE (1) TABLET BY MOUTH EVERY DAY 30 tablet 2  . glucose blood (ONE TOUCH ULTRA TEST) test strip USE ONCE A DAY 100 each 12  . latanoprost (XALATAN) 0.005 % ophthalmic solution Place into both eyes  at bedtime.    Marland Kitchen levothyroxine (SYNTHROID, LEVOTHROID) 75 MCG tablet TAKE ONE (1) TABLET BY MOUTH EACH DAY 90 tablet 1  . loratadine (ALAVERT) 10 MG tablet Take 10 mg by mouth daily as needed for allergies.    . metFORMIN (GLUCOPHAGE-XR) 500 MG 24 hr tablet TAKE 2 TABLETS BY MOUTH EACH MORNING WITH BREAKFAST 60 tablet 2  . Multiple Vitamin (MULTIVITAMIN WITH MINERALS) TABS tablet Take 1 tablet by mouth daily.    . nitroGLYCERIN (NITROSTAT) 0.4 MG SL tablet Place 1 tablet (0.4 mg total) under the tongue every 5 (five) minutes as needed for chest pain. 25 tablet 11  . ONETOUCH DELICA LANCETS 40N MISC USE TO CHECK BLOOD SUGAR ONCE DAILY 100 each 1  . tamsulosin (FLOMAX) 0.4 MG CAPS capsule TAKE 1 CAPSULE BY MOUTH DAILY 30 capsule 2   No facility-administered medications prior to visit.     Allergies  Allergen Reactions  . Adhesive [Tape]   . Latex     rash  . Other Hives    Plastic shopping bags Metal staples: rash, swelling    Review of Systems  Constitutional: Negative for fever and malaise/fatigue.  HENT: Negative for congestion.   Eyes: Negative for blurred vision.  Respiratory: Negative for shortness of breath.   Cardiovascular: Negative for chest pain, palpitations and leg swelling.  Gastrointestinal: Negative for abdominal pain, blood in stool and nausea.  Genitourinary: Negative for dysuria and frequency.  Musculoskeletal: Positive for joint pain. Negative for falls.  Skin: Negative for rash.  Neurological: Negative for dizziness, loss of consciousness and headaches.  Endo/Heme/Allergies: Negative for environmental allergies.  Psychiatric/Behavioral: Negative for depression. The patient  is not nervous/anxious.        Objective:    Physical Exam unable to obtain via telephone  There were no vitals taken for this visit. Wt Readings from Last 3 Encounters:  09/15/18 203 lb (92.1 kg)  08/09/18 205 lb (93 kg)  08/07/18 205 lb (93 kg)    Diabetic Foot Exam - Simple   No data filed     Lab Results  Component Value Date   WBC 8.3 05/02/2018   HGB 12.4 (L) 05/02/2018   HCT 37.6 (L) 05/02/2018   PLT 213.0 05/02/2018   GLUCOSE 115 (H) 08/15/2018   CHOL 122 09/08/2018   TRIG 48 09/08/2018   HDL 45 09/08/2018   LDLCALC 67 09/08/2018   ALT 20 09/08/2018   AST 18 09/08/2018   NA 142 08/15/2018   K 4.6 08/15/2018   CL 105 08/15/2018   CREATININE 1.13 08/15/2018   BUN 23 08/15/2018   CO2 22 08/15/2018   TSH 3.95 05/02/2018   PSA 1.47 05/02/2018   INR 0.99 04/20/2013   HGBA1C 6.8 (H) 05/02/2018   MICROALBUR 0.7 03/11/2016    Lab Results  Component Value Date   TSH 3.95 05/02/2018   Lab Results  Component Value Date   WBC 8.3 05/02/2018   HGB 12.4 (L) 05/02/2018   HCT 37.6 (L) 05/02/2018   MCV 91.4 05/02/2018   PLT 213.0 05/02/2018   Lab Results  Component Value Date   NA 142 08/15/2018   K 4.6 08/15/2018   CO2 22 08/15/2018   GLUCOSE 115 (H) 08/15/2018   BUN 23 08/15/2018   CREATININE 1.13 08/15/2018   BILITOT 0.5 09/08/2018   ALKPHOS 89 09/08/2018   AST 18 09/08/2018   ALT 20 09/08/2018   PROT 6.6 09/08/2018   ALBUMIN 4.3 09/08/2018   CALCIUM 9.0 08/15/2018  GFR 82.40 05/02/2018   Lab Results  Component Value Date   CHOL 122 09/08/2018   Lab Results  Component Value Date   HDL 45 09/08/2018   Lab Results  Component Value Date   LDLCALC 67 09/08/2018   Lab Results  Component Value Date   TRIG 48 09/08/2018   Lab Results  Component Value Date   CHOLHDL 2.7 09/08/2018   Lab Results  Component Value Date   HGBA1C 6.8 (H) 05/02/2018       Assessment & Plan:   Problem List Items Addressed This Visit    Hypothyroidism     On Levothyroxine, continue to monitor      Hypertension    Encouraged patient to check vitals weekly and record call us with any concerns. no changes to meds. Encouraged heart healthy diet such as the DASH diet and exercise as tolerated.       Diabetes mellitus due to underlying condition with unspecified complications ([place])    TDDU2G acceptable, minimize simple carbs. Increase exercise as tolerated. Continue current meds      Bilateral shoulder pain - Primary    Try aspercreme and Tylenol bid and referred to ortho for further evaluation. Has pain putting his shirt on and off. For about 2-3 months. No fall or injury, no neck pain or radicular pain.       Relevant Orders   Ambulatory referral to Orthopedic Surgery   Educated About Covid-19 Virus Infection    Encouraged to keep up quarantine as much as possible, mask and hand wash. Consider deep breathing exercises, zinc 50 mg daily and vitamin C 500 mg bid and call with any concerns         I am having Harry Saxon T. Chevez "Tim" maintain his multivitamin with minerals, loratadine, aspirin EC, latanoprost, cholecalciferol, finasteride, glucose blood, metFORMIN, atenolol, levothyroxine, OneTouch Delica Lancets 25K, nitroGLYCERIN, atorvastatin, and tamsulosin.  No orders of the defined types were placed in this encounter.    Penni Homans, MD   I discussed the assessment and treatment plan with the patient. The patient was provided an opportunity to ask questions and all were answered. The patient agreed with the plan and demonstrated an understanding of the instructions.   The patient was advised to call back or seek an in-person evaluation if the symptoms worsen or if the condition fails to improve as anticipated.  I provided 25 minutes of non-face-to-face time during this encounter.   Penni Homans, MD

## 2018-10-31 NOTE — Assessment & Plan Note (Signed)
On Levothyroxine, continue to monitor 

## 2018-10-31 NOTE — Assessment & Plan Note (Signed)
Encouraged patient to check vitals weekly and record call us with any concerns. no changes to meds. Encouraged heart healthy diet such as the DASH diet and exercise as tolerated.

## 2018-10-31 NOTE — Assessment & Plan Note (Signed)
hgba1c acceptable, minimize simple carbs. Increase exercise as tolerated. Continue current meds 

## 2018-10-31 NOTE — Assessment & Plan Note (Signed)
Try aspercreme and Tylenol bid and referred to ortho for further evaluation. Has pain putting his shirt on and off. For about 2-3 months. No fall or injury, no neck pain or radicular pain.

## 2018-10-31 NOTE — Assessment & Plan Note (Signed)
Encouraged to keep up quarantine as much as possible, mask and hand wash. Consider deep breathing exercises, zinc 50 mg daily and vitamin C 500 mg bid and call with any concerns

## 2018-11-04 ENCOUNTER — Other Ambulatory Visit: Payer: Self-pay | Admitting: Family Medicine

## 2018-11-07 DIAGNOSIS — H401211 Low-tension glaucoma, right eye, mild stage: Secondary | ICD-10-CM | POA: Diagnosis not present

## 2018-11-07 DIAGNOSIS — H401221 Low-tension glaucoma, left eye, mild stage: Secondary | ICD-10-CM | POA: Diagnosis not present

## 2018-11-07 DIAGNOSIS — H04123 Dry eye syndrome of bilateral lacrimal glands: Secondary | ICD-10-CM | POA: Diagnosis not present

## 2018-11-07 DIAGNOSIS — E119 Type 2 diabetes mellitus without complications: Secondary | ICD-10-CM | POA: Diagnosis not present

## 2018-11-17 LAB — HEPATIC FUNCTION PANEL
ALT: 20 IU/L (ref 0–44)
AST: 18 IU/L (ref 0–40)
Albumin: 4.3 g/dL (ref 3.7–4.7)
Alkaline Phosphatase: 89 IU/L (ref 39–117)
Bilirubin Total: 0.5 mg/dL (ref 0.0–1.2)
Bilirubin, Direct: 0.2 mg/dL (ref 0.00–0.40)
Total Protein: 6.6 g/dL (ref 6.0–8.5)

## 2018-11-17 LAB — SPECIMEN STATUS REPORT

## 2019-01-02 ENCOUNTER — Other Ambulatory Visit: Payer: Self-pay | Admitting: Family Medicine

## 2019-01-25 ENCOUNTER — Telehealth: Payer: Medicare Other | Admitting: Cardiology

## 2019-01-26 ENCOUNTER — Other Ambulatory Visit: Payer: Self-pay | Admitting: Family Medicine

## 2019-02-05 ENCOUNTER — Other Ambulatory Visit: Payer: Self-pay | Admitting: Family Medicine

## 2019-02-26 ENCOUNTER — Encounter: Payer: Self-pay | Admitting: Cardiology

## 2019-02-26 ENCOUNTER — Other Ambulatory Visit: Payer: Self-pay

## 2019-02-26 ENCOUNTER — Ambulatory Visit (INDEPENDENT_AMBULATORY_CARE_PROVIDER_SITE_OTHER): Payer: Medicare Other | Admitting: Cardiology

## 2019-02-26 ENCOUNTER — Ambulatory Visit (INDEPENDENT_AMBULATORY_CARE_PROVIDER_SITE_OTHER): Payer: Medicare Other

## 2019-02-26 VITALS — BP 130/80 | HR 38 | Temp 98.1°F | Ht 74.0 in | Wt 195.0 lb

## 2019-02-26 DIAGNOSIS — G4730 Sleep apnea, unspecified: Secondary | ICD-10-CM

## 2019-02-26 DIAGNOSIS — E088 Diabetes mellitus due to underlying condition with unspecified complications: Secondary | ICD-10-CM

## 2019-02-26 DIAGNOSIS — R002 Palpitations: Secondary | ICD-10-CM | POA: Diagnosis not present

## 2019-02-26 DIAGNOSIS — I714 Abdominal aortic aneurysm, without rupture, unspecified: Secondary | ICD-10-CM

## 2019-02-26 DIAGNOSIS — I739 Peripheral vascular disease, unspecified: Secondary | ICD-10-CM

## 2019-02-26 DIAGNOSIS — R001 Bradycardia, unspecified: Secondary | ICD-10-CM

## 2019-02-26 NOTE — Patient Instructions (Signed)
Medication Instructions:  Your physician recommends that you continue on your current medications as directed. Please refer to the Current Medication list given to you today.  If you need a refill on your cardiac medications before your next appointment, please call your pharmacy.   Lab work: Your physician recommends that you have a TSH drawn today.  If you have labs (blood work) drawn today and your tests are completely normal, you will receive your results only by: Marland Kitchen MyChart Message (if you have MyChart) OR . A paper copy in the mail If you have any lab test that is abnormal or we need to change your treatment, we will call you to review the results.  Testing/Procedures: You had an EKG performed today.  Your physician has recommended that you wear a ZIO monitor. ZIO monitors are medical devices that record the heart's electrical activity. Doctors most often use these monitors to diagnose arrhythmias. Arrhythmias are problems with the speed or rhythm of the heartbeat. The monitor is a small, portable device. You can wear one while you do your normal daily activities. This is usually used to diagnose what is causing palpitations/syncope (passing out). You will wear this device for 3 days.   Follow-Up: At [facility], you and your health needs are our priority.  As part of our continuing mission to provide you with exceptional heart care, we have created designated Provider Care Teams.  These Care Teams include your primary Cardiologist (physician) and Advanced Practice Providers (APPs -  Physician Assistants and Nurse Practitioners) who all work together to provide you with the care you need, when you need it. You will need a follow up appointment in 6 months.

## 2019-02-26 NOTE — Progress Notes (Signed)
Cardiology Office Note:    Date:  02/26/2019   ID:  [name], DOB 1940-08-19, MRN [id number]  PCP:  [name], MD  Cardiologist:  Jenean Lindau, MD   Referring MD: [name], MD    ASSESSMENT:    1. PVD (peripheral vascular disease) ([place])   2. Abdominal aortic aneurysm (AAA) without rupture ([place])   3. Sleep apnea, unspecified type   4. Diabetes mellitus due to underlying condition with unspecified complications ([place])    PLAN:    In order of problems listed above:  1. Sinus bradycardia: Patient has frequent PVCs in a pattern of bigeminy on the EKG.  For this reason I will do 3-day ZIO monitor.  I will also do a TSH to make sure he is not hypothyroid.  He has no symptoms from his bradycardia.  He denies any dizziness or any passing out spells. 2. Essential hypertension: Blood pressure is stable Mixed dyslipidemia: Lipids were reviewed with the patient at extensive length and diet was emphasized. Patient will be seen in follow-up appointment in 6 months or earlier if the patient has any concerns    Medication Adjustments/Labs and Tests Ordered: Current medicines are reviewed at length with the patient today.  Concerns regarding medicines are outlined above.  No orders of the defined types were placed in this encounter.  No orders of the defined types were placed in this encounter.    Chief Complaint  Patient presents with  . Follow-up     History of Present Illness:    Harry Brown is a 78 y.o. male.  Patient has history of peripheral vascular disease.  He denies any problems at this time and takes care of activities of daily living.  No chest pain orthopnea or PND.  No palpitations dizzy spells or any syncopal spells.  At the time of my evaluation, the patient is alert awake oriented and in no distress.  Past Medical History:  Diagnosis Date  . Anemia 09/24/2013  . CAD (coronary artery disease)   . Cellulitis 05/14/2013   RLE  .  Diverticulitis   . ED (erectile dysfunction)   . Glaucoma 08/18/2014  . History of sleep apnea    had surgery to correct  . Hypertension   . Hypothyroidism   . Medicare annual wellness visit, subsequent 04/13/2013  . Myocardial infarction ([place]) 11/05/1985  . Nocturia 03/15/2017  . Other and unspecified hyperlipidemia   . Penile lesion 02/24/2015  . Peripheral neuropathy 03/31/2012  . Peripheral vascular disease ([place])   . Postoperative anemia due to acute blood loss 09/24/2013  . Preventative health care 03/13/2016  . Sleep apnea 09/16/2017  . Type II diabetes mellitus (HCC)    fasting 100-120  . Urinary urgency 11/22/2012    Past Surgical History:  Procedure Laterality Date  . ABDOMINAL AORTAGRAM  Oct. 9, 2014   Dr. Bridgett Larsson  . ABDOMINAL AORTAGRAM N/A 03/15/2013   Procedure: ABDOMINAL Maxcine Ham;  Surgeon: Conrad [place], MD;  Location: [facility] CATH LAB;  Service: Cardiovascular;  Laterality: N/A;  . ANTERIOR CERVICAL DECOMP/DISCECTOMY FUSION  ~ 2000  . CATARACT EXTRACTION Right   . CORONARY ARTERY BYPASS GRAFT  2001   "CABG X3" (05/14/2013)  . DENTAL SURGERY     "multiple teeth removed; trmimed top of mouth so dentures would fit" (05/14/2013)  . EYE SURGERY     cataracts  . FEMORAL-POPLITEAL BYPASS GRAFT Right 04/25/2013   Procedure: RIGHT FEMORAL TO ABOVE KNEE POPLITEAL BYPASS GRAFT WITH  RIGHT GREATER SAPHENOUS VEIN HARVEST; ULTRASOUND GUIDED;  Surgeon: Conrad Har[place], MD;  Location: [facility] OR;  Service: Vascular;  Laterality: Right;  . GUM SURGERY  [phone]   "had bone scraped; had periodontal disease" (05/14/2013)  . PALATE / UVULA BIOPSY / EXCISION  2003   Removed due to sleep apnea  . TONSILLECTOMY      Current Medications: Current Meds  Medication Sig  . acetaminophen (TYLENOL) 500 MG tablet 500 mg 2 (two) times daily.  Marland Kitchen aspirin EC 81 MG tablet Take 81 mg by mouth at bedtime.  Marland Kitchen atenolol (TENORMIN) 25 MG tablet TAKE 1 TABLET BY MOUTH DAILY  . atorvastatin (LIPITOR) 40 MG tablet TAKE ONE (1)  TABLET BY MOUTH EVERY DAY AT 6PM  . cholecalciferol (VITAMIN D) 1000 units tablet Take 1,000 Units by mouth daily.  . finasteride (PROSCAR) 5 MG tablet TAKE ONE (1) TABLET BY MOUTH EVERY DAY  . glucose blood (ONE TOUCH ULTRA TEST) test strip USE ONCE A DAY  . latanoprost (XALATAN) 0.005 % ophthalmic solution Place into both eyes at bedtime.  Marland Kitchen levothyroxine (SYNTHROID) 75 MCG tablet TAKE ONE (1) TABLET BY MOUTH EVERY DAY  . loratadine (ALAVERT) 10 MG tablet Take 10 mg by mouth daily as needed for allergies.  . metFORMIN (GLUCOPHAGE-XR) 500 MG 24 hr tablet TAKE 2 TABLETS BY MOUTH EVERY MORNING WITH BREAKFAST  . Multiple Vitamin (MULTIVITAMIN WITH MINERALS) TABS tablet Take 1 tablet by mouth daily.  . nitroGLYCERIN (NITROSTAT) 0.4 MG SL tablet Place 1 tablet (0.4 mg total) under the tongue every 5 (five) minutes as needed for chest pain.  Glory Rosebush DELICA LANCETS 43X MISC USE TO CHECK BLOOD SUGAR ONCE DAILY  . tamsulosin (FLOMAX) 0.4 MG CAPS capsule TAKE 1 CAPSULE BY MOUTH DAILY     Allergies:   Adhesive [tape], Latex, and Other   Social History   Socioeconomic History  . Marital status: Married    Spouse name: Not on file  . Number of children: 8   . Years of education: Not on file  . Highest education level: Not on file  Occupational History    Employer: [employer]  Social Needs  . Financial resource strain: Not on file  . Food insecurity    Worry: Not on file    Inability: Not on file  . Transportation needs    Medical: Not on file    Non-medical: Not on file  Tobacco Use  . Smoking status: Former Smoker    Packs/day: 1.50    Years: 50.00    Pack years: 75.00    Types: Cigarettes  . Smokeless tobacco: Never Used  . Tobacco comment: 05/14/2013 "quit smoking in ~ 2004; still Uses Comit lozenges"  Substance and Sexual Activity  . Alcohol use: No    Alcohol/week: 0.0 standard drinks  . Drug use: No  . Sexual activity: Yes    Comment: lives with wife, retiring  end of next week, no dietary restrictions  Lifestyle  . Physical activity    Days per week: Not on file    Minutes per session: Not on file  . Stress: Not on file  Relationships  . Social Herbalist on phone: Not on file    Gets together: Not on file    Attends religious service: Not on file    Active member of club or organization: Not on file    Attends meetings of clubs or organizations: Not on file    Relationship  status: Not on file  Other Topics Concern  . 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 There is no history of Cancer.  ROS:   Please see the history of present illness.    All other systems reviewed and are negative.  EKGs/Labs/Other Studies Reviewed:    The following studies were reviewed today:  EKG reveals sinus rhythm with ventricular bigeminy.    IMPRESSIONS    1. The left ventricle has normal systolic function with an ejection fraction of 60-65%. The cavity size was normal. Left ventricular diastolic Doppler parameters are consistent with pseudonormalization.  2. The right ventricle has normal systolic function. The cavity was normal. There is no increase in right ventricular wall thickness.  3. The mitral valve is normal in structure. There is mild mitral annular calcification present.  4. The tricuspid valve is normal in structure.  5. The aortic valve is normal in structure. Mild sclerosis of the aortic valve.  6. The pulmonic valve was normal in structure.  7. There is moderate dilatation of the ascending aorta measuring 42 mm.   Recent Labs: 05/02/2018: Hemoglobin 12.4; Platelets 213.0; TSH  3.95 08/15/2018: BUN 23; Creatinine, Ser 1.13; Potassium 4.6; Sodium 142 09/08/2018: ALT 20  Recent Lipid Panel    Component Value Date/Time   CHOL 122 09/08/2018 0904   TRIG 48 09/08/2018 0904   HDL 45 09/08/2018 0904   CHOLHDL 2.7 09/08/2018 0904   CHOLHDL 3 05/02/2018 1053   VLDL 9.0 05/02/2018 1053   LDLCALC 67 09/08/2018 0904    Physical Exam:    VS:  BP 130/80 (BP Location: Left Arm, Patient Position: Sitting, Cuff Size: Normal)   Pulse (!) 38   Temp 98.1 F (36.7 C)   Ht 6\' 2"  (1.88 m)   Wt 195 lb (88.5 kg)   SpO2 98%   BMI 25.04 kg/m     Wt Readings from Last 3 Encounters:  02/26/19 195 lb (88.5 kg)  09/15/18 203 lb (92.1 kg)  08/09/18 205 lb (93 kg)     GEN: Patient is in no acute distress HEENT: Normal NECK: No JVD; No carotid bruits LYMPHATICS: No lymphadenopathy CARDIAC: Hear sounds regular, 2/6 systolic murmur at the apex. RESPIRATORY:  Clear to auscultation without rales, wheezing or rhonchi  ABDOMEN: Soft, non-tender, non-distended MUSCULOSKELETAL:  No edema; No deformity  SKIN: Warm and dry NEUROLOGIC:  Alert and oriented x 3 PSYCHIATRIC:  Normal affect   Signed, Jenean Lindau, MD  02/26/2019 3:32 PM    [facility]

## 2019-02-28 ENCOUNTER — Telehealth: Payer: Self-pay

## 2019-02-28 LAB — TSH: TSH: 3.52 u[IU]/mL (ref 0.450–4.500)

## 2019-02-28 NOTE — Telephone Encounter (Signed)
-----   Message from Jenean Lindau, MD sent at 02/28/2019  9:02 AM EDT ----- The results of the study is unremarkable. Please inform patient. I will discuss in detail at next appointment. Cc  primary care/referring physician Jenean Lindau, MD 02/28/2019 9:02 AM

## 2019-02-28 NOTE — Telephone Encounter (Signed)
Patient informed and copy sent to Dr.Blyth per Dr. Docia Furl.

## 2019-03-01 DIAGNOSIS — R001 Bradycardia, unspecified: Secondary | ICD-10-CM | POA: Diagnosis not present

## 2019-03-05 ENCOUNTER — Other Ambulatory Visit: Payer: Self-pay | Admitting: Family Medicine

## 2019-03-06 ENCOUNTER — Other Ambulatory Visit: Payer: Self-pay

## 2019-03-06 NOTE — Patient Outreach (Signed)
[facility]) Care Management  03/06/2019  MASSAI HANKERSON 1941-03-13 [id number]   Medication Adherence call to Mr. Keena Heesch [facility] Ident[facility] Verify spoke with patient he is past due on Metformin Er 500 mg patient explain he is  taking 2 tablets daily but he has been taking 1 tablet because his blood pressure gets too low, patient has pick up this prescription from the the pharmacy and has enough for a month. Mr. Gerrard is showing past due under [place].   [facility] Direct Dial [phone]  Fax [phone] [email or web address]

## 2019-03-09 DIAGNOSIS — H2512 Age-related nuclear cataract, left eye: Secondary | ICD-10-CM | POA: Diagnosis not present

## 2019-03-09 DIAGNOSIS — H401211 Low-tension glaucoma, right eye, mild stage: Secondary | ICD-10-CM | POA: Diagnosis not present

## 2019-03-09 DIAGNOSIS — H401221 Low-tension glaucoma, left eye, mild stage: Secondary | ICD-10-CM | POA: Diagnosis not present

## 2019-03-09 DIAGNOSIS — H04123 Dry eye syndrome of bilateral lacrimal glands: Secondary | ICD-10-CM | POA: Diagnosis not present

## 2019-03-09 DIAGNOSIS — R001 Bradycardia, unspecified: Secondary | ICD-10-CM | POA: Diagnosis not present

## 2019-03-19 ENCOUNTER — Telehealth: Payer: Self-pay

## 2019-03-19 NOTE — Telephone Encounter (Signed)
Results relayed, copy sent to Dr. Charlett Blake

## 2019-03-19 NOTE — Telephone Encounter (Signed)
-----   Message from Jenean Lindau, MD sent at 03/16/2019  5:20 PM EDT ----- The results of the study is unremarkable. Please inform patient. I will discuss in detail at next appointment. Cc  primary care/referring physician Jenean Lindau, MD 03/16/2019 5:20 PM

## 2019-04-03 ENCOUNTER — Other Ambulatory Visit: Payer: Self-pay | Admitting: Family Medicine

## 2019-04-11 ENCOUNTER — Other Ambulatory Visit: Payer: Self-pay | Admitting: Family Medicine

## 2019-04-20 ENCOUNTER — Other Ambulatory Visit: Payer: Self-pay

## 2019-04-20 ENCOUNTER — Ambulatory Visit (INDEPENDENT_AMBULATORY_CARE_PROVIDER_SITE_OTHER): Payer: Medicare Other

## 2019-04-20 DIAGNOSIS — Z23 Encounter for immunization: Secondary | ICD-10-CM | POA: Diagnosis not present

## 2019-04-20 NOTE — Progress Notes (Signed)
Pre visit review using our clinic review tool, if applicable. No additional management support is needed unless otherwise documented below in the visit note.  Patient here for flu vaccine. 0.69mL flu vaccine given in right deltoid IM. Patient tolerated well. VIS given.

## 2019-05-14 NOTE — Progress Notes (Signed)
Virtual Visit via Video Note  I connected with patient on 05/15/19 at  9:30 AM EST by audio enabled telemedicine application and verified that I am speaking with the correct person using two identifiers.   THIS ENCOUNTER IS A VIRTUAL VISIT DUE TO COVID-19 - PATIENT WAS NOT SEEN IN THE OFFICE. PATIENT HAS CONSENTED TO VIRTUAL VISIT / TELEMEDICINE VISIT   Location of patient: home  Location of provider: office  I discussed the limitations of evaluation and management by telemedicine and the availability of in person appointments. The patient expressed understanding and agreed to proceed.   Subjective:   Harry Brown is a 78 y.o. male who presents for Medicare Annual/Subsequent preventive examination.  Review of Systems:  Cardiac Risk Factors include: advanced age (>53men, >69 women);diabetes mellitus;dyslipidemia;hypertension;male gender Home Safety/Smoke Alarms: Feels safe in home. Smoke alarms in place.  Lives w/ wife in 1 story home.   Male:   CCS- declines    PSA-  Lab Results  Component Value Date   PSA 1.47 05/02/2018   PSA 0.46 03/11/2016   PSA 0.58 11/20/2012   Eye- Dr.Fontain q42months.     Objective:    Vitals: BP 124/82   Pulse 65   Wt 190 lb 9.6 oz (86.5 kg)   BMI 24.47 kg/m   Body mass index is 24.47 kg/m.  Advanced Directives 05/15/2019 06/26/2018 05/09/2018 09/13/2016 03/11/2016 04/11/2015 10/04/2014  Does Patient Have a Medical Advance Directive? No No No Yes Yes No No  Type of Advance Directive - - Public librarian;Living will Living will - -  Does patient want to make changes to medical advance directive? No - Patient declined - - - - - -  Copy of Press photographer in Chart? - - - No - copy requested No - copy requested - -  Would patient like information on creating a medical advance directive? - No - Patient declined Yes (MAU/Ambulatory/Procedural Areas - Information given) - - Yes - Educational materials given Yes - Microbiologist given  Pre-existing out of facility DNR order (yellow form or pink MOST form) - - - - - - -    Tobacco Social History   Tobacco Use  Smoking Status Former Smoker  . Packs/day: 1.50  . Years: 50.00  . Pack years: 75.00  . Types: Cigarettes  Smokeless Tobacco Never Used  Tobacco Comment   05/14/2013 "quit smoking in ~ 2004; still Uses Comit lozenges"     Counseling given: Not Answered Comment: 05/14/2013 "quit smoking in ~ 2004; still Uses Comit lozenges"   Clinical Intake: Pain : No/denies pain     Past Medical History:  Diagnosis Date  . Anemia 09/24/2013  . CAD (coronary artery disease)   . Cellulitis 05/14/2013   RLE  . Diverticulitis   . ED (erectile dysfunction)   . Glaucoma 08/18/2014  . History of sleep apnea    had surgery to correct  . Hypertension   . Hypothyroidism   . Medicare annual wellness visit, subsequent 04/13/2013  . Myocardial infarction ([place]) 11/05/1985  . Nocturia 03/15/2017  . Other and unspecified hyperlipidemia   . Penile lesion 02/24/2015  . Peripheral neuropathy 03/31/2012  . Peripheral vascular disease ([place])   . Postoperative anemia due to acute blood loss 09/24/2013  . Preventative health care 03/13/2016  . Sleep apnea 09/16/2017  . Type II diabetes mellitus ([facility])    fasting 100-120  . Urinary urgency 11/22/2012   Past Surgical History:  Procedure Laterality  Date  . ABDOMINAL AORTAGRAM  Oct. 9, 2014   Dr. Bridgett Larsson  . ABDOMINAL AORTAGRAM N/A 03/15/2013   Procedure: ABDOMINAL Maxcine Ham;  Surgeon: Conrad Spackenkill, MD;  Location[facility] CATH LAB;  Service: Cardiovascular;  Laterality: N/A;  . ANTERIOR CERVICAL DECOMP/DISCECTOMY FUSION  ~ 2000  . CATARACT EXTRACTION Right   . CORONARY ARTERY BYPASS GRAFT  2001   "CABG X3" (05/14/2013)  . DENTAL SURGERY     "multiple teeth removed; trmimed top of mouth so dentures would fit" (05/14/2013)  . EYE SURGERY     cataracts  . FEMORAL-POPLITEAL BYPASS GRAFT Right 04/25/2013   Procedure: RIGHT FEMORAL TO  ABOVE KNEE POPLITEAL BYPASS GRAFT WITH RIGHT GREATER SAPHENOUS VEIN HARVEST; ULTRASOUND GUIDED;  Surgeon: Conrad St. Martin, MD;  Location: [facility];  Service: Vascular;  Laterality: Right;  . GUM SURGERY  [phone]   "had bone scraped; had periodontal disease" (05/14/2013)  . PALATE / UVULA BIOPSY / EXCISION  2003   Removed due to sleep apnea  . TONSILLECTOMY     Family History  Problem Relation Age of Onset  . Hyperlipidemia Mother   . Heart disease Mother   . Diabetes Mother   . Hypertension Mother   . Heart disease Father   . Asthma Father   . Arthritis Father   . Heart disease Sister   . Heart disease Sister        s/p bypass x 2  . Lupus Sister   . Heart disease Brother        MI waiting on heart transplant when died  . Heart disease Brother        massive MI  . Cancer Neg Hx    Social History   Socioeconomic History  . Marital status: Married    Spouse name: Not on file  . Number of children: 8   . Years of education: Not on file  . Highest education level: Not on file  Occupational History    Employer: [employer]  Social Needs  . Financial resource strain: Not on file  . Food insecurity    Worry: Not on file    Inability: Not on file  . Transportation needs    Medical: Not on file    Non-medical: Not on file  Tobacco Use  . Smoking status: Former Smoker    Packs/day: 1.50    Years: 50.00    Pack years: 75.00    Types: Cigarettes  . Smokeless tobacco: Never Used  . Tobacco comment: 05/14/2013 "quit smoking in ~ 2004; still Uses Comit lozenges"  Substance and Sexual Activity  . Alcohol use: No    Alcohol/week: 0.0 standard drinks  . Drug use: No  . Sexual activity: Yes    Comment: lives with wife, retiring end of next week, no dietary restrictions  Lifestyle  . Physical activity    Days per week: Not on file    Minutes per session: Not on file  . Stress: Not on file  Relationships  . Social Herbalist on phone: Not on file    Gets  together: Not on file    Attends religious service: Not on file    Active member of club or organization: Not on file    Attends meetings of clubs or organizations: Not on file    Relationship status: Not on file  Other Topics Concern  . 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 acetaminophen (TYLENOL) 500 MG tablet 500 mg 2 (two) times daily.  Marland Kitchen aspirin EC 81 MG tablet Take 81 mg by mouth at bedtime.  Marland Kitchen atenolol (TENORMIN) 25 MG tablet TAKE 1 TABLET BY MOUTH DAILY  . atorvastatin (LIPITOR) 40 MG tablet TAKE ONE (1) TABLET BY MOUTH EVERY DAY AT 6PM  . cholecalciferol (VITAMIN D) 1000 units tablet Take 1,000 Units by mouth daily.  . finasteride (PROSCAR) 5 MG tablet TAKE ONE (1) TABLET BY MOUTH EVERY DAY  . latanoprost (XALATAN) 0.005 % ophthalmic solution Place into both eyes at bedtime.  Marland Kitchen levothyroxine (SYNTHROID) 75 MCG tablet TAKE ONE (1) TABLET BY MOUTH EVERY DAY  . loratadine (ALAVERT) 10 MG tablet Take 10 mg by mouth daily as needed for allergies.  . metFORMIN (GLUCOPHAGE-XR) 500 MG 24 hr tablet TAKE TWO TABLETS BY MOUTH EVERY MORNING WITH BREAKFAST  . Multiple Vitamin (MULTIVITAMIN WITH MINERALS) TABS tablet Take 1 tablet by mouth daily.  Glory Rosebush DELICA LANCETS 58N MISC USE TO CHECK BLOOD SUGAR ONCE DAILY  . ONETOUCH ULTRA test strip USE 1 STRIP DAILY  . tamsulosin (FLOMAX) 0.4 MG CAPS capsule TAKE ONE CAPSULE BY MOUTH DAILY  . nitroGLYCERIN (NITROSTAT) 0.4 MG SL tablet Place 1 tablet (0.4 mg total) under the tongue every 5 (five) minutes as needed for chest pain.   No facility-administered encounter medications on file as of 05/15/2019.     Activities of Daily Living In your present state of health, do you have any difficulty performing the following activities: 05/15/2019  Hearing? N  Vision? N  Difficulty concentrating or making  decisions? N  Walking or climbing stairs? N  Dressing or bathing? N  Doing errands, shopping? N  Preparing Food and eating ? N  Using the Toilet? N  In the past six months, have you accidently leaked urine? N  Do you have problems with loss of bowel control? N  Managing your Medications? N  Managing your Finances? N  Housekeeping or managing your Housekeeping? N  Some recent data might be hidden    Patient Care Team: [name], MD as PCP - General (Family Medicine) Conrad [place], MD as Consulting Physician (Vascular Surgery) Calvert Cantor, MD as Consulting Physician (Ophthalmology) Malachy Chamber, DDS as Consulting Physician (Periodontics)   Assessment:   This is a routine wellness examination for Daking. Physical assessment deferred to PCP.  Exercise Activities and Dietary recommendations Current Exercise Habits: The patient does not participate in regular exercise at present, Exercise limited by: None identified Diet (meal preparation, eat out, water intake, caffeinated beverages, dairy products, fruits and vegetables): in general, a "healthy" diet  , well balanced   Goals    . Healthy Lifestyle     Continue to eat heart healthy diet (full of fruits, vegetables, whole grains, lean protein, water--limit salt, fat, and sugar intake) and increase physical activity as tolerated. Continue doing brain stimulating activities (puzzles, reading, adult coloring books, staying active) to keep memory sharp.         Fall Risk Fall Risk  05/15/2019 05/09/2018 09/13/2016 03/11/2016 02/24/2015  Falls in the past year? 1 0 Yes No Yes  Number falls in past yr: 1 - 1 - 1  Injury with Fall? 0 - No - No  Follow up Education provided;Falls prevention discussed - Falls prevention discussed - -    Depression Screen PHQ 2/9 Scores  05/15/2019 05/09/2018 09/13/2016 03/11/2016  PHQ - 2 Score 0 0 0 0    Cognitive Function Ad8 score reviewed for issues:  Issues making decisions: no  Less  interest in hobbies / activities:no  Repeats questions, stories (family complaining):no  Trouble using ordinary gadgets (microwave, computer, phone):no  Forgets the month or year: no  Mismanaging finances: no  Remembering appts:no  Daily problems with thinking and/or memory:no Ad8 score is=0     MMSE - Mini Mental State Exam 09/13/2016 03/11/2016  Orientation to time 5 5  Orientation to Place 5 5  Registration 3 3  Attention/ Calculation 4 5  Recall 2 3  Language- name 2 objects 2 2  Language- repeat 1 1  Language- follow 3 step command 3 3  Language- read & follow direction 1 1  Write a sentence 1 1  Copy design 1 1  Total score 28 30        Immunization History  Administered Date(s) Administered  . Fluad Quad(high Dose 65+) 04/20/2019  . Influenza Split 03/31/2012  . Influenza, High Dose Seasonal PF 03/20/2013, 02/24/2015, 03/11/2016, 03/15/2017, 05/02/2018  . Influenza,inj,Quad PF,6+ Mos 01/29/2014  . Pneumococcal Conjugate-13 03/20/2013  . Pneumococcal Polysaccharide-23 02/24/2015  . Td 06/07/2009    Screening Tests Health Maintenance  Topic Date Due  . URINE MICROALBUMIN  03/11/2017  . HEMOGLOBIN A1C  10/31/2018  . FOOT EXAM  12/16/2018  . OPHTHALMOLOGY EXAM  05/27/2019  . TETANUS/TDAP  06/08/2019  . INFLUENZA VACCINE  Completed  . PNA vac Low Risk Adult  Completed     Plan:   See you next year!  Continue to eat heart healthy diet (full of fruits, vegetables, whole grains, lean protein, water--limit salt, fat, and sugar intake) and increase physical activity as tolerated.  Continue doing brain stimulating activities (puzzles, reading, adult coloring books, staying active) to keep memory sharp.   Bring a copy of your living will and/or healthcare power of attorney to your next office visit.   I have personally reviewed and noted the following in the patient's chart:   . Medical and social history . Use of alcohol, tobacco or illicit drugs  .  Current medications and supplements . Functional ability and status . Nutritional status . Physical activity . Advanced directives . List of other physicians . Hospitalizations, surgeries, and ER visits in previous 12 months . Vitals . Screenings to include cognitive, depression, and falls . Referrals and appointments  In addition, I have reviewed and discussed with patient certain preventive protocols, quality metrics, and best practice recommendations. A written personalized care plan for preventive services as well as general preventive health recommendations were provided to patient.     Shela Nevin, [place]  05/15/2019

## 2019-05-15 ENCOUNTER — Encounter: Payer: Self-pay | Admitting: *Deleted

## 2019-05-15 ENCOUNTER — Other Ambulatory Visit: Payer: Self-pay

## 2019-05-15 ENCOUNTER — Ambulatory Visit (INDEPENDENT_AMBULATORY_CARE_PROVIDER_SITE_OTHER): Payer: Medicare Other | Admitting: *Deleted

## 2019-05-15 VITALS — BP 124/82 | HR 65 | Wt 190.6 lb

## 2019-05-15 DIAGNOSIS — Z0000 Encounter for general adult medical examination without abnormal findings: Secondary | ICD-10-CM

## 2019-05-15 NOTE — Patient Instructions (Signed)
See you next year!  Continue to eat heart healthy diet (full of fruits, vegetables, whole grains, lean protein, water--limit salt, fat, and sugar intake) and increase physical activity as tolerated.  Continue doing brain stimulating activities (puzzles, reading, adult coloring books, staying active) to keep memory sharp.   Bring a copy of your living will and/or healthcare power of attorney to your next office visit.   Harry Brown , Thank you for taking time to come for your Medicare Wellness Visit. I appreciate your ongoing commitment to your health goals. Please review the following plan we discussed and let me know if I can assist you in the future.   These are the goals we discussed: Goals    . Healthy Lifestyle     Continue to eat heart healthy diet (full of fruits, vegetables, whole grains, lean protein, water--limit salt, fat, and sugar intake) and increase physical activity as tolerated. Continue doing brain stimulating activities (puzzles, reading, adult coloring books, staying active) to keep memory sharp.         This is a list of the screening recommended for you and due dates:  Health Maintenance  Topic Date Due  . Urine Protein Check  03/11/2017  . Hemoglobin A1C  10/31/2018  . Complete foot exam   12/16/2018  . Eye exam for diabetics  05/27/2019  . Tetanus Vaccine  06/08/2019  . Flu Shot  Completed  . Pneumonia vaccines  Completed    Preventive Care 44 Years and Older, Male Preventive care refers to lifestyle choices and visits with your health care provider that can promote health and wellness. This includes:  A yearly physical exam. This is also called an annual well check.  Regular dental and eye exams.  Immunizations.  Screening for certain conditions.  Healthy lifestyle choices, such as diet and exercise. What can I expect for my preventive care visit? Physical exam Your health care provider will check:  Height and weight. These may be used to  calculate body mass index (BMI), which is a measurement that tells if you are at a healthy weight.  Heart rate and blood pressure.  Your skin for abnormal spots. Counseling Your health care provider may ask you questions about:  Alcohol, tobacco, and drug use.  Emotional well-being.  Home and relationship well-being.  Sexual activity.  Eating habits.  History of falls.  Memory and ability to understand (cognition).  Work and work Statistician. What immunizations do I need?  Influenza (flu) vaccine  This is recommended every year. Tetanus, diphtheria, and pertussis (Tdap) vaccine  You may need a Td booster every 10 years. Varicella (chickenpox) vaccine  You may need this vaccine if you have not already been vaccinated. Zoster (shingles) vaccine  You may need this after age 78. Pneumococcal conjugate (PCV13) vaccine  One dose is recommended after age 52. Pneumococcal polysaccharide (PPSV23) vaccine  One dose is recommended after age 65. Measles, mumps, and rubella (MMR) vaccine  You may need at least one dose of MMR if you were born in 1957 or later. You may also need a second dose. Meningococcal conjugate (MenACWY) vaccine  You may need this if you have certain conditions. Hepatitis A vaccine  You may need this if you have certain conditions or if you travel or work in places where you may be exposed to hepatitis A. Hepatitis B vaccine  You may need this if you have certain conditions or if you travel or work in places where you may be exposed to  hepatitis B. Haemophilus influenzae type b (Hib) vaccine  You may need this if you have certain conditions. You may receive vaccines as individual doses or as more than one vaccine together in one shot (combination vaccines). Talk with your health care provider about the risks and benefits of combination vaccines. What tests do I need? Blood tests  Lipid and cholesterol levels. These may be checked every 5 years,  or more frequently depending on your overall health.  Hepatitis C test.  Hepatitis B test. Screening  Lung cancer screening. You may have this screening every year starting at age 49 if you have a 30-pack-year history of smoking and currently smoke or have quit within the past 15 years.  Colorectal cancer screening. All adults should have this screening starting at age 99 and continuing until age 38. Your health care provider may recommend screening at age 41 if you are at increased risk. You will have tests every 1-10 years, depending on your results and the type of screening test.  Prostate cancer screening. Recommendations will vary depending on your family history and other risks.  Diabetes screening. This is done by checking your blood sugar (glucose) after you have not eaten for a while (fasting). You may have this done every 1-3 years.  Abdominal aortic aneurysm (AAA) screening. You may need this if you are a current or former smoker.  Sexually transmitted disease (STD) testing. Follow these instructions at home: Eating and drinking  Eat a diet that includes fresh fruits and vegetables, whole grains, lean protein, and low-fat dairy products. Limit your intake of foods with high amounts of sugar, saturated fats, and salt.  Take vitamin and mineral supplements as recommended by your health care provider.  Do not drink alcohol if your health care provider tells you not to drink.  If you drink alcohol: ? Limit how much you have to 0-2 drinks a day. ? Be aware of how much alcohol is in your drink. In the [place] one drink equals one 12 oz bottle of beer (355 mL), one 5 oz glass of wine (148 mL), or one 1 oz glass of hard liquor (44 mL). Lifestyle  Take daily care of your teeth and gums.  Stay active. Exercise for at least 30 minutes on 5 or more days each week.  Do not use any products that contain nicotine or tobacco, such as cigarettes, e-cigarettes, and chewing tobacco. If you  need help quitting, ask your health care provider.  If you are sexually active, practice safe sex. Use a condom or other form of protection to prevent STIs (sexually transmitted infections).  Talk with your health care provider about taking a low-dose aspirin or statin. What's next?  Visit your health care provider once a year for a well check visit.  Ask your health care provider how often you should have your eyes and teeth checked.  Stay up to date on all vaccines. This information is not intended to replace advice given to you by your health care provider. Make sure you discuss any questions you have with your health care provider. Document Released: 06/20/2015 Document Revised: 05/18/2018 Document Reviewed: 05/18/2018 Elsevier Patient Education  2020 [employer].

## 2019-05-16 ENCOUNTER — Other Ambulatory Visit: Payer: Self-pay | Admitting: Family Medicine

## 2019-05-16 ENCOUNTER — Other Ambulatory Visit: Payer: Self-pay | Admitting: *Deleted

## 2019-05-16 DIAGNOSIS — M25512 Pain in left shoulder: Secondary | ICD-10-CM

## 2019-05-16 DIAGNOSIS — G8929 Other chronic pain: Secondary | ICD-10-CM

## 2019-05-16 DIAGNOSIS — M25511 Pain in right shoulder: Secondary | ICD-10-CM

## 2019-05-16 DIAGNOSIS — I714 Abdominal aortic aneurysm, without rupture, unspecified: Secondary | ICD-10-CM

## 2019-05-18 NOTE — Progress Notes (Signed)
HPI: FU AAA andcoronary artery disease.Had coronary artery bypassing graft in July of 2001 with a LIMA to the LAD, RIMA to the circumflex and saphenous vein graft to the PDA. Patient has known peripheral vascular disease followed by vascular surgery. Abdominal ultrasound 12/19 showed 3.7 cm abdominal aortic aneurysm.Chest CT March 2020 showed 4 cm thoracic aortic aneurysm and small pulmonary nodules.  Nuclear study March 2020 showed no ischemia.  Echocardiogram March 2020 showed normal LV function, grade 2 diastolic dysfunction, mildly dilated aortic root at 42 mm.  Monitor October 2020 showed sinus with occasional PACs and PVCs.  Since last seen the patient denies any dyspnea on exertion, orthopnea, PND, pedal edema, palpitations, syncope or chest pain.   Current Outpatient Medications  Medication Sig Dispense Refill  . acetaminophen (TYLENOL) 500 MG tablet 500 mg every 8 (eight) hours as needed for mild pain.     Marland Kitchen aspirin EC 81 MG tablet Take 81 mg by mouth at bedtime.    Marland Kitchen atenolol (TENORMIN) 25 MG tablet TAKE 1 TABLET BY MOUTH DAILY 30 tablet 5  . atorvastatin (LIPITOR) 40 MG tablet TAKE ONE (1) TABLET BY MOUTH EVERY DAY AT 6PM 30 tablet 11  . cholecalciferol (VITAMIN D) 1000 units tablet Take 1,000 Units by mouth daily.    . finasteride (PROSCAR) 5 MG tablet TAKE ONE (1) TABLET BY MOUTH EVERY DAY 30 tablet 2  . latanoprost (XALATAN) 0.005 % ophthalmic solution Place into both eyes at bedtime.    Marland Kitchen levothyroxine (SYNTHROID) 75 MCG tablet TAKE ONE (1) TABLET BY MOUTH EVERY DAY 90 tablet 1  . loratadine (ALAVERT) 10 MG tablet Take 10 mg by mouth daily as needed for allergies.    . metFORMIN (GLUCOPHAGE-XR) 500 MG 24 hr tablet TAKE TWO TABLETS BY MOUTH EVERY MORNING WITH BREAKFAST 60 tablet 2  . Multiple Vitamin (MULTIVITAMIN WITH MINERALS) TABS tablet Take 1 tablet by mouth daily.    . nitroGLYCERIN (NITROSTAT) 0.4 MG SL tablet Place 1 tablet (0.4 mg total) under the tongue every 5  (five) minutes as needed for chest pain. 25 tablet 11  . ONETOUCH DELICA LANCETS 98P MISC USE TO CHECK BLOOD SUGAR ONCE DAILY 100 each 1  . ONETOUCH ULTRA test strip USE 1 STRIP DAILY 100 each 0  . tamsulosin (FLOMAX) 0.4 MG CAPS capsule TAKE ONE CAPSULE BY MOUTH DAILY 30 capsule 2   No current facility-administered medications for this visit.     Past Medical History:  Diagnosis Date  . Anemia 09/24/2013  . CAD (coronary artery disease)   . Cellulitis 05/14/2013   RLE  . Diverticulitis   . ED (erectile dysfunction)   . Glaucoma 08/18/2014  . History of sleep apnea    had surgery to correct  . Hypertension   . Hypothyroidism   . Medicare annual wellness visit, subsequent 04/13/2013  . Myocardial infarction ([place]) 11/05/1985  . Nocturia 03/15/2017  . Other and unspecified hyperlipidemia   . Penile lesion 02/24/2015  . Peripheral neuropathy 03/31/2012  . Peripheral vascular disease ([place])   . Postoperative anemia due to acute blood loss 09/24/2013  . Preventative health care 03/13/2016  . Sleep apnea 09/16/2017  . Type II diabetes mellitus ([facility])    fasting 100-120  . Urinary urgency 11/22/2012    Past Surgical History:  Procedure Laterality Date  . ABDOMINAL AORTAGRAM  Oct. 9, 2014   Dr. Bridgett Larsson  . ABDOMINAL AORTAGRAM N/A 03/15/2013   Procedure: ABDOMINAL Maxcine Ham;  Surgeon: Conrad Edgewater,  MD;  Location: [place] CATH LAB;  Service: Cardiovascular;  Laterality: N/A;  . ANTERIOR CERVICAL DECOMP/DISCECTOMY FUSION  ~ 2000  . CATARACT EXTRACTION Right   . CORONARY ARTERY BYPASS GRAFT  2001   "CABG X3" (05/14/2013)  . DENTAL SURGERY     "multiple teeth removed; trmimed top of mouth so dentures would fit" (05/14/2013)  . EYE SURGERY     cataracts  . FEMORAL-POPLITEAL BYPASS GRAFT Right 04/25/2013   Procedure: RIGHT FEMORAL TO ABOVE KNEE POPLITEAL BYPASS GRAFT WITH RIGHT GREATER SAPHENOUS VEIN HARVEST; ULTRASOUND GUIDED;  Surgeon: Conrad Van Dyne, MD;  Location: [facility] OR;  Service: Vascular;  Laterality:  Right;  . GUM SURGERY  [phone]   "had bone scraped; had periodontal disease" (05/14/2013)  . PALATE / UVULA BIOPSY / EXCISION  2003   Removed due to sleep apnea  . TONSILLECTOMY      Social History   Socioeconomic History  . Marital status: Married    Spouse name: Not on file  . Number of children: 8   . Years of education: Not on file  . Highest education level: Not on file  Occupational History    Employer: [employer]  Tobacco Use  . Smoking status: Former Smoker    Packs/day: 1.50    Years: 50.00    Pack years: 75.00    Types: Cigarettes  . Smokeless tobacco: Never Used  . Tobacco comment: 05/14/2013 "quit smoking in ~ 2004; still Uses Comit lozenges"  Substance and Sexual Activity  . Alcohol use: No    Alcohol/week: 0.0 standard drinks  . Drug use: No  . Sexual activity: Yes    Comment: lives with wife, retiring end of next week, no dietary restrictions  Other Topics Concern  . 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 Difficulty of Paying Living Expenses: Not on file  Food Insecurity:   . Worried About Charity fundraiser in the Last Year: Not on file  . Ran Out of Food in the Last Year: Not on file  Transportation Needs:   . Lack of Transportation (Medical): Not on file  . Lack of Transportation (Non-Medical): Not on file  Physical Activity:   . Days of Exercise per Week: Not on file  . Minutes of Exercise per Session: Not on file  Stress:   . Feeling of Stress : Not on file  Social Connections:   . Frequency of Communication with Friends and Family: Not on file  . Frequency of Social Gatherings with Friends and Family: Not on file  . Attends Religious Services: Not on file  . Active Member of Clubs or Organizations: Not on file  . Attends Archivist Meetings: Not on file  . Marital Status: Not on file   Intimate Partner Violence:   . Fear of Current or Ex-Partner: Not on file  . Emotionally Abused: Not on file  . Physically Abused: Not on file  . Sexually Abused: Not on file    Family History  Problem Relation Age of Onset  . Hyperlipidemia Mother   . Heart disease Mother   . Diabetes Mother   . Hypertension Mother   . Heart disease Father   . Asthma Father   . Arthritis Father   . Heart disease Sister   . Heart disease Sister  s/p bypass x 2  . Lupus Sister   . Heart disease Brother        MI waiting on heart transplant when died  . Heart disease Brother        massive MI  . Cancer Neg Hx     ROS: no fevers or chills, productive cough, hemoptysis, dysphasia, odynophagia, melena, hematochezia, dysuria, hematuria, rash, seizure activity, orthopnea, PND, pedal edema, claudication. Remaining systems are negative.  Physical Exam: Well-developed well-nourished in no acute distress.  Skin is warm and dry.  HEENT is normal.  Neck is supple.  Chest is clear to auscultation with normal expansion.  Cardiovascular exam is regular rate and rhythm.  Abdominal exam nontender or distended. No masses palpated. Extremities show no edema. neuro grossly intact  A/P  1 coronary artery disease status post coronary artery bypass and graft-patient denies chest pain.  Continue medical therapy with aspirin and statin.  2 abdominal aortic aneurysm-Await follow-up ultrasound performed today.  3 hypertension-patient's blood pressure is controlled today.  Continue present medical regimen.  4 hyperlipidemia-continue statin.  5 thoracic aortic aneurysm-plan repeat CTA March 2021.  Kirk Ruths, MD

## 2019-05-23 ENCOUNTER — Ambulatory Visit (HOSPITAL_BASED_OUTPATIENT_CLINIC_OR_DEPARTMENT_OTHER)
Admission: RE | Admit: 2019-05-23 | Discharge: 2019-05-23 | Disposition: A | Discharge status: Home / Self Care | Payer: Medicare Other | Source: Ambulatory Visit | Attending: Cardiology | Admitting: Cardiology

## 2019-05-23 ENCOUNTER — Ambulatory Visit: Payer: Medicare Other | Admitting: Orthopaedic Surgery

## 2019-05-23 ENCOUNTER — Encounter: Payer: Self-pay | Admitting: Cardiology

## 2019-05-23 ENCOUNTER — Other Ambulatory Visit: Payer: Self-pay

## 2019-05-23 ENCOUNTER — Ambulatory Visit (INDEPENDENT_AMBULATORY_CARE_PROVIDER_SITE_OTHER): Payer: Medicare Other | Admitting: Cardiology

## 2019-05-23 VITALS — BP 124/68 | HR 52 | Ht 74.0 in | Wt 196.0 lb

## 2019-05-23 DIAGNOSIS — E782 Mixed hyperlipidemia: Secondary | ICD-10-CM

## 2019-05-23 DIAGNOSIS — I2510 Atherosclerotic heart disease of native coronary artery without angina pectoris: Secondary | ICD-10-CM

## 2019-05-23 DIAGNOSIS — I714 Abdominal aortic aneurysm, without rupture, unspecified: Secondary | ICD-10-CM

## 2019-05-23 DIAGNOSIS — I10 Essential (primary) hypertension: Secondary | ICD-10-CM

## 2019-05-23 NOTE — Patient Instructions (Signed)
Medication Instructions:  NO CHANGE *If you need a refill on your cardiac medications before your next appointment, please call your pharmacy*  Lab Work: If you have labs (blood work) drawn today and your tests are completely normal, you will receive your results only by: . MyChart Message (if you have MyChart) OR . A paper copy in the mail If you have any lab test that is abnormal or we need to change your treatment, we will call you to review the results.  Follow-Up: At [facility], you and your health needs are our priority.  As part of our continuing mission to provide you with exceptional heart care, we have created designated Provider Care Teams.  These Care Teams include your primary Cardiologist (physician) and Advanced Practice Providers (APPs -  Physician Assistants and Nurse Practitioners) who all work together to provide you with the care you need, when you need it.  Your next appointment:   6 month(s)  The format for your next appointment:   Either In Person or Virtual  Provider:   Brian Crenshaw, MD   

## 2019-05-23 NOTE — Progress Notes (Signed)
VAS [place] AAA DUPLEX COMPLETE   05/23/19 Cardell Peach RDCS, RVT

## 2019-05-24 ENCOUNTER — Encounter: Payer: Self-pay | Admitting: *Deleted

## 2019-05-24 ENCOUNTER — Other Ambulatory Visit: Payer: Self-pay | Admitting: *Deleted

## 2019-05-24 DIAGNOSIS — I714 Abdominal aortic aneurysm, without rupture, unspecified: Secondary | ICD-10-CM

## 2019-06-02 ENCOUNTER — Other Ambulatory Visit: Payer: Self-pay | Admitting: Family Medicine

## 2019-06-15 ENCOUNTER — Encounter: Payer: Self-pay | Admitting: Family Medicine

## 2019-06-15 NOTE — Telephone Encounter (Signed)
Pt has no future appt scheduled with PCP. Please advise when pt is due for office visit?

## 2019-06-23 ENCOUNTER — Other Ambulatory Visit: Payer: Self-pay | Admitting: Family Medicine

## 2019-07-02 ENCOUNTER — Other Ambulatory Visit: Payer: Self-pay | Admitting: Family Medicine

## 2019-07-05 ENCOUNTER — Telehealth: Payer: Self-pay

## 2019-07-05 ENCOUNTER — Telehealth: Payer: Self-pay | Admitting: Family Medicine

## 2019-07-05 NOTE — Telephone Encounter (Signed)
[id number]   

## 2019-07-05 NOTE — Telephone Encounter (Signed)
His most recent EKG showed sinus rhythm with frequent PVCs so would hold off on giving an anticoagulant until cardiology weighs in. If patient begins to feel ill he should seek care.

## 2019-07-05 NOTE — Telephone Encounter (Signed)
Dr.Watkins With Medicare currently at the patients home he states:  Today episode remarkable change in his pulse 72 to 40 standing still rhythm regular with gallops No anticoagulant on his med list . Dr. Chrissie Noa phone [phone].

## 2019-07-06 ENCOUNTER — Telehealth: Payer: Self-pay | Admitting: Cardiology

## 2019-07-06 NOTE — Telephone Encounter (Signed)
Called patient left detailed message about message below and for him to reach out to Cardiology  Will send this to Dr. Stanford Breed for review

## 2019-07-06 NOTE — Telephone Encounter (Signed)
Pt. Says that he was returning a call. I looked and didn't see any recent calls from anyone other that Harry Brown.

## 2019-07-06 NOTE — Telephone Encounter (Signed)
Please contact patient and make sure he feels well.  If so would not change any medications or add anticoagulation.  Follow heart rate and contact us with follow-up readings. Kirk Ruths

## 2019-07-06 NOTE — Telephone Encounter (Signed)
Left message for patient with Dr Creshaw's recommendations.   

## 2019-07-30 ENCOUNTER — Other Ambulatory Visit: Payer: Self-pay | Admitting: Family Medicine

## 2019-08-06 ENCOUNTER — Telehealth (HOSPITAL_COMMUNITY): Payer: Self-pay

## 2019-08-06 NOTE — Telephone Encounter (Signed)

## 2019-08-07 ENCOUNTER — Other Ambulatory Visit: Payer: Self-pay

## 2019-08-07 ENCOUNTER — Ambulatory Visit (INDEPENDENT_AMBULATORY_CARE_PROVIDER_SITE_OTHER)
Admission: RE | Admit: 2019-08-07 | Discharge: 2019-08-07 | Disposition: A | Discharge status: Home / Self Care | Payer: Medicare Other | Source: Ambulatory Visit | Attending: Surgery | Admitting: Surgery

## 2019-08-07 ENCOUNTER — Ambulatory Visit: Payer: Medicare Other | Admitting: Physician Assistant

## 2019-08-07 ENCOUNTER — Ambulatory Visit (HOSPITAL_COMMUNITY)
Admission: RE | Admit: 2019-08-07 | Discharge: 2019-08-07 | Disposition: A | Discharge status: Home / Self Care | Payer: Medicare Other | Source: Ambulatory Visit | Attending: Surgery | Admitting: Surgery

## 2019-08-07 VITALS — BP 151/79 | HR 68 | Temp 97.9°F | Resp 18 | Ht 72.0 in | Wt 198.1 lb

## 2019-08-07 DIAGNOSIS — I779 Disorder of arteries and arterioles, unspecified: Secondary | ICD-10-CM

## 2019-08-07 NOTE — Addendum Note (Signed)
Addended by: York Cerise C on: 08/07/2019 06:40 AM   Modules accepted: Orders

## 2019-08-07 NOTE — Progress Notes (Signed)
Office Note     CC:  follow up Requesting Provider:  [name], MD  HPI: Harry Brown is a 79 y.o. (1940-11-09) male who presents for routine follow-up peripheral arterial disease.  Since his last visit 1 year ago, he is ambulating less primarily due to the weather.  He has intermittent nighttime cramps in his legs.  He says this resolves if he gets up and stretches his muscles to walk.  His main complaint today is inability to fully externally rotate his left shoulder.  His wife participates in the visit via cordless phone.  The pt is on a statin for cholesterol management.  The pt is on a daily aspirin.   Other AC: None The pt is on atenolol for hypertension.   The pt is diabetic.  Metformin Tobacco hx: None currently  Past Medical History:  Diagnosis Date  . Anemia 09/24/2013  . CAD (coronary artery disease)   . Cellulitis 05/14/2013   RLE  . Diverticulitis   . ED (erectile dysfunction)   . Glaucoma 08/18/2014  . History of sleep apnea    had surgery to correct  . Hypertension   . Hypothyroidism   . Medicare annual wellness visit, subsequent 04/13/2013  . Myocardial infarction ([place]) 11/05/1985  . Nocturia 03/15/2017  . Other and unspecified hyperlipidemia   . Penile lesion 02/24/2015  . Peripheral neuropathy 03/31/2012  . Peripheral vascular disease ([place])   . Postoperative anemia due to acute blood loss 09/24/2013  . Preventative health care 03/13/2016  . Sleep apnea 09/16/2017  . Type II diabetes mellitus ([facility])    fasting 100-120  . Urinary urgency 11/22/2012    Past Surgical History:  Procedure Laterality Date  . ABDOMINAL AORTAGRAM  Oct. 9, 2014   Dr. Bridgett Larsson  . ABDOMINAL AORTAGRAM N/A 03/15/2013   Procedure: ABDOMINAL Maxcine Ham;  Surgeon: Conrad Lyerly, MD;  Location: [facility] CATH LAB;  Service: Cardiovascular;  Laterality: N/A;  . ANTERIOR CERVICAL DECOMP/DISCECTOMY FUSION  ~ 2000  . CATARACT EXTRACTION Right   . CORONARY ARTERY BYPASS GRAFT  2001   "CABG X3"  (05/14/2013)  . DENTAL SURGERY     "multiple teeth removed; trmimed top of mouth so dentures would fit" (05/14/2013)  . EYE SURGERY     cataracts  . FEMORAL-POPLITEAL BYPASS GRAFT Right 04/25/2013   Procedure: RIGHT FEMORAL TO ABOVE KNEE POPLITEAL BYPASS GRAFT WITH RIGHT GREATER SAPHENOUS VEIN HARVEST; ULTRASOUND GUIDED;  Surgeon: Conrad Pioneer, MD;  Location: [facility] OR;  Service: Vascular;  Laterality: Right;  . GUM SURGERY  [phone]   "had bone scraped; had periodontal disease" (05/14/2013)  . PALATE / UVULA BIOPSY / EXCISION  2003   Removed due to sleep apnea  . TONSILLECTOMY      Social History   Socioeconomic History  . Marital status: Married    Spouse name: Not on file  . Number of children: 8   . Years of education: Not on file  . Highest education level: Not on file  Occupational History    Employer: [employer]  Tobacco Use  . Smoking status: Former Smoker    Packs/day: 1.50    Years: 50.00    Pack years: 75.00    Types: Cigarettes  . Smokeless tobacco: Never Used  . Tobacco comment: 05/14/2013 "quit smoking in ~ 2004; still Uses Comit lozenges"  Substance and Sexual Activity  . Alcohol use: No    Alcohol/week: 0.0 standard drinks  . Drug use: No  . Sexual  activity: Yes    Comment: lives with wife, retiring end of next week, no dietary restrictions  Other Topics Concern  . 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 Difficulty of Paying Living Expenses: Not on file  Food Insecurity:   . Worried About Charity fundraiser in the Last Year: Not on file  . Ran Out of Food in the Last Year: Not on file  Transportation Needs:   . Lack of Transportation (Medical): Not on file  . Lack of Transportation (Non-Medical): Not on file  Physical Activity:   . Days of Exercise per Week: Not on file  . Minutes of Exercise per Session: Not  on file  Stress:   . Feeling of Stress : Not on file  Social Connections:   . Frequency of Communication with Friends and Family: Not on file  . Frequency of Social Gatherings with Friends and Family: Not on file  . Attends Religious Services: Not on file  . Active Member of Clubs or Organizations: Not on file  . Attends Archivist Meetings: Not on file  . Marital Status: Not on file  Intimate Partner Violence:   . Fear of Current or Ex-Partner: Not on file  . Emotionally Abused: Not on file  . Physically Abused: Not on file  . Sexually Abused: Not on file    Family History  Problem Relation Age of Onset  . Hyperlipidemia Mother   . Heart disease Mother   . Diabetes Mother   . Hypertension Mother   . Heart disease Father   . Asthma Father   . Arthritis Father   . Heart disease Sister   . Heart disease Sister        s/p bypass x 2  . Lupus Sister   . Heart disease Brother        MI waiting on heart transplant when died  . Heart disease Brother        massive MI  . Cancer Neg Hx     Current Outpatient Medications  Medication Sig Dispense Refill  . acetaminophen (TYLENOL) 500 MG tablet 500 mg every 8 (eight) hours as needed for mild pain.     Marland Kitchen aspirin EC 81 MG tablet Take 81 mg by mouth at bedtime.    Marland Kitchen atenolol (TENORMIN) 25 MG tablet TAKE ONE (1) TABLET BY MOUTH EVERY DAY 30 tablet 3  . atorvastatin (LIPITOR) 40 MG tablet TAKE ONE (1) TABLET BY MOUTH EVERY DAY AT 6PM 30 tablet 11  . cholecalciferol (VITAMIN D) 1000 units tablet Take 1,000 Units by mouth daily.    . finasteride (PROSCAR) 5 MG tablet TAKE ONE (1) TABLET BY MOUTH EVERY DAY 30 tablet 2  . latanoprost (XALATAN) 0.005 % ophthalmic solution Place into both eyes at bedtime.    Marland Kitchen levothyroxine (SYNTHROID) 75 MCG tablet TAKE ONE (1) TABLET BY MOUTH EVERY DAY 90 tablet 0  . loratadine (ALAVERT) 10 MG tablet Take 10 mg by mouth daily as needed for allergies.    . metFORMIN (GLUCOPHAGE-XR) 500 MG 24 hr  tablet TAKE TWO (2) TABLETS BY MOUTH EVERY MORNING WITH BREAKFAST 60 tablet 0  . Multiple Vitamin (MULTIVITAMIN WITH MINERALS) TABS tablet Take 1 tablet by mouth daily.    Glory Rosebush DELICA LANCETS 16W MISC USE TO CHECK BLOOD SUGAR ONCE DAILY 100  each 1  . ONETOUCH ULTRA test strip USE 1 STRIP DAILY 100 each 0  . tamsulosin (FLOMAX) 0.4 MG CAPS capsule TAKE ONE CAPSULE BY MOUTH DAILY 30 capsule 2  . nitroGLYCERIN (NITROSTAT) 0.4 MG SL tablet Place 1 tablet (0.4 mg total) under the tongue every 5 (five) minutes as needed for chest pain. 25 tablet 11   No current facility-administered medications for this visit.    Allergies  Allergen Reactions  . Adhesive [Tape]   . Latex     rash  . Other Hives    Plastic shopping bags Metal staples: rash, swelling     REVIEW OF SYSTEMS:   [X]  denotes positive finding, [ ]  denotes negative finding Cardiac  Comments:  Chest pain or chest pressure:    Shortness of breath upon exertion:    Short of breath when lying flat:    Irregular heart rhythm:        Vascular    Pain in calf, thigh, or hip brought on by ambulation:    Pain in feet at night that wakes you up from your sleep:     Blood clot in your veins:    Leg swelling:         Pulmonary    Oxygen at home:    Productive cough:     Wheezing:         Neurologic    Sudden weakness in arms or legs:     Sudden numbness in arms or legs:     Sudden onset of difficulty speaking or slurred speech:    Temporary loss of vision in one eye:     Problems with dizziness:         Gastrointestinal    Blood in stool:     Vomited blood:         Genitourinary    Burning when urinating:     Blood in urine:        Psychiatric    Major depression:         Hematologic    Bleeding problems:    Problems with blood clotting too easily:        Skin    Rashes or ulcers:        Constitutional    Fever or chills:      PHYSICAL EXAMINATION:  Vitals:   08/07/19 1407  BP: (!) 151/79  Pulse:  68  Resp: 18  Temp: 97.9 F (36.6 C)  TempSrc: Temporal  SpO2: 99%  Weight: 198 lb 1.6 oz (89.9 kg)  Height: 6' (1.829 m)    General:  WDWN in NAD; vital signs documented above Gait: Not observed HENT: WNL, normocephalic Pulmonary: normal non-labored breathing , without Rales, rhonchi,  wheezing Cardiac: regular HR, without  Murmurs  Abdomen: soft, NT, no masses Skin: without rashes Vascular Exam/Pulses: Extremities: with ischemic changes (shiny skin, lack of hair), without Gangrene , without cellulitis; without open wounds;  Musculoskeletal: no muscle wasting or atrophy  Neurologic: A&O X 3;  No focal weakness or paresthesias are detected Psychiatric:  The pt has Normal affect.  Pulse exam: Palpable femoral pulses.  Nonpalpable popliteal pulses or pedal pulses.  He has triphasic dorsalis pedis, peroneal and posterior tibial pulses on the right.  Monophasic left pedal pulses. He has 2+ brachial and ulnar pulses bilaterally.  Non-Invasive Vascular Imaging:   ABI's/TBI's 06/26/2018: Right:  1.14/0.96             PT:  (B)  DP: (B) Left:  0.69/0.74             PT: (M)             DP: (M)  ABIs/TBIs 08/08/2019 Right: 1.03/0.81  AT and DP are triphasic Left: 0.74/0.54    PTA and DP are monophasic Right ABIs appear essentially unchanged compared to prior study on  06/26/2018. Left ABIs and TBIs appear essentially unchanged compared to  prior study on 06/26/2018. Right TBIs appear increased compared to prior  exam on 06/26/2018.    ASSESSMENT/PLAN:: 79 y.o. male here for follow up for right femoral to popliteal artery bypass vein graft.  Graft is patent with good Doppler signals.  The patient is doing well and I do not think his leg cramps are attributable to vascular rest pain.  We discussed efforts at walking more, continuing his medications as prescribed and routine follow-up.  I recommended discussing shoulder pain with his primary care provider with possible  referral to orthopedic specialist.  Barbie Banner, PA-C [facility] [phone]  Clinic MD:   Carlis Abbott

## 2019-08-08 ENCOUNTER — Encounter: Payer: Self-pay | Admitting: *Deleted

## 2019-08-08 ENCOUNTER — Other Ambulatory Visit: Payer: Self-pay | Admitting: *Deleted

## 2019-08-08 DIAGNOSIS — Z95828 Presence of other vascular implants and grafts: Secondary | ICD-10-CM

## 2019-08-08 DIAGNOSIS — I779 Disorder of arteries and arterioles, unspecified: Secondary | ICD-10-CM

## 2019-08-10 DIAGNOSIS — E119 Type 2 diabetes mellitus without complications: Secondary | ICD-10-CM | POA: Diagnosis not present

## 2019-08-10 DIAGNOSIS — H401221 Low-tension glaucoma, left eye, mild stage: Secondary | ICD-10-CM | POA: Diagnosis not present

## 2019-08-10 DIAGNOSIS — H04123 Dry eye syndrome of bilateral lacrimal glands: Secondary | ICD-10-CM | POA: Diagnosis not present

## 2019-08-10 DIAGNOSIS — H401211 Low-tension glaucoma, right eye, mild stage: Secondary | ICD-10-CM | POA: Diagnosis not present

## 2019-08-10 DIAGNOSIS — Z961 Presence of intraocular lens: Secondary | ICD-10-CM | POA: Diagnosis not present

## 2019-08-29 DIAGNOSIS — H2512 Age-related nuclear cataract, left eye: Secondary | ICD-10-CM | POA: Diagnosis not present

## 2019-08-30 ENCOUNTER — Other Ambulatory Visit: Payer: Self-pay | Admitting: Family Medicine

## 2019-09-04 ENCOUNTER — Encounter: Payer: Self-pay | Admitting: *Deleted

## 2019-09-11 DIAGNOSIS — H2512 Age-related nuclear cataract, left eye: Secondary | ICD-10-CM | POA: Diagnosis not present

## 2019-09-11 DIAGNOSIS — H2181 Floppy iris syndrome: Secondary | ICD-10-CM | POA: Diagnosis not present

## 2019-09-11 DIAGNOSIS — H25812 Combined forms of age-related cataract, left eye: Secondary | ICD-10-CM | POA: Diagnosis not present

## 2019-10-01 ENCOUNTER — Other Ambulatory Visit: Payer: Self-pay | Admitting: Family Medicine

## 2019-10-08 ENCOUNTER — Telehealth: Payer: Self-pay | Admitting: *Deleted

## 2019-10-08 ENCOUNTER — Other Ambulatory Visit: Payer: Self-pay | Admitting: *Deleted

## 2019-10-08 DIAGNOSIS — I712 Thoracic aortic aneurysm, without rupture, unspecified: Secondary | ICD-10-CM

## 2019-10-08 NOTE — Telephone Encounter (Signed)
Spoke with pt, he is due for CTA of the aorta to be done at the [facility]. Scheduled for Friday 10/12/19 @ 1:30 pm. Patient aware he will bmp prior to CT scan. Order placed for labs to be drawn at the [place] office.

## 2019-10-10 DIAGNOSIS — I712 Thoracic aortic aneurysm, without rupture: Secondary | ICD-10-CM | POA: Diagnosis not present

## 2019-10-11 LAB — BASIC METABOLIC PANEL
BUN/Creatinine Ratio: 21 (ref 10–24)
BUN: 21 mg/dL (ref 8–27)
CO2: 24 mmol/L (ref 20–29)
Calcium: 9.3 mg/dL (ref 8.6–10.2)
Chloride: 105 mmol/L (ref 96–106)
Creatinine, Ser: 1.02 mg/dL (ref 0.76–1.27)
GFR calc Af Amer: 80 mL/min/{1.73_m2} (ref >59)
GFR calc non Af Amer: 70 mL/min/{1.73_m2} (ref >59)
Glucose: 140 mg/dL — ABNORMAL HIGH (ref 65–99)
Potassium: 4.7 mmol/L (ref 3.5–5.2)
Sodium: 141 mmol/L (ref 134–144)

## 2019-10-12 ENCOUNTER — Ambulatory Visit (HOSPITAL_BASED_OUTPATIENT_CLINIC_OR_DEPARTMENT_OTHER)
Admission: RE | Admit: 2019-10-12 | Discharge: 2019-10-12 | Disposition: A | Discharge status: Home / Self Care | Payer: Medicare Other | Source: Ambulatory Visit | Attending: Cardiology | Admitting: Cardiology

## 2019-10-12 ENCOUNTER — Other Ambulatory Visit: Payer: Self-pay

## 2019-10-12 ENCOUNTER — Encounter (HOSPITAL_BASED_OUTPATIENT_CLINIC_OR_DEPARTMENT_OTHER): Payer: Self-pay

## 2019-10-12 DIAGNOSIS — I712 Thoracic aortic aneurysm, without rupture, unspecified: Secondary | ICD-10-CM

## 2019-10-12 MED ORDER — IOHEXOL 350 MG/ML SOLN
100.0000 mL | Freq: Once | INTRAVENOUS | Status: AC | PRN
  Administered 2019-10-12: 100 mL via INTRAVENOUS

## 2019-10-29 ENCOUNTER — Other Ambulatory Visit: Payer: Self-pay | Admitting: Family Medicine

## 2019-11-10 ENCOUNTER — Emergency Department (HOSPITAL_BASED_OUTPATIENT_CLINIC_OR_DEPARTMENT_OTHER): Payer: Medicare Other

## 2019-11-10 ENCOUNTER — Other Ambulatory Visit: Payer: Self-pay

## 2019-11-10 ENCOUNTER — Emergency Department (HOSPITAL_BASED_OUTPATIENT_CLINIC_OR_DEPARTMENT_OTHER)
Admission: EM | Admit: 2019-11-10 | Discharge: 2019-11-10 | Disposition: A | Discharge status: Home / Self Care | Payer: Medicare Other | Attending: Emergency Medicine | Admitting: Emergency Medicine

## 2019-11-10 ENCOUNTER — Encounter (HOSPITAL_BASED_OUTPATIENT_CLINIC_OR_DEPARTMENT_OTHER): Payer: Self-pay | Admitting: Emergency Medicine

## 2019-11-10 DIAGNOSIS — M549 Dorsalgia, unspecified: Secondary | ICD-10-CM

## 2019-11-10 DIAGNOSIS — Z7901 Long term (current) use of anticoagulants: Secondary | ICD-10-CM | POA: Diagnosis not present

## 2019-11-10 DIAGNOSIS — I714 Abdominal aortic aneurysm, without rupture: Secondary | ICD-10-CM | POA: Diagnosis not present

## 2019-11-10 DIAGNOSIS — M545 Low back pain: Secondary | ICD-10-CM | POA: Diagnosis not present

## 2019-11-10 DIAGNOSIS — I4891 Unspecified atrial fibrillation: Secondary | ICD-10-CM | POA: Diagnosis not present

## 2019-11-10 DIAGNOSIS — M546 Pain in thoracic spine: Secondary | ICD-10-CM | POA: Diagnosis present

## 2019-11-10 LAB — CBC WITH DIFFERENTIAL/PLATELET
Abs Immature Granulocytes: 0.04 10*3/uL (ref 0.00–0.07)
Basophils Absolute: 0.1 10*3/uL (ref 0.0–0.1)
Basophils Relative: 1 %
Eosinophils Absolute: 0.4 10*3/uL (ref 0.0–0.5)
Eosinophils Relative: 4 %
HCT: 38.3 % — ABNORMAL LOW (ref 39.0–52.0)
Hemoglobin: 12.4 g/dL — ABNORMAL LOW (ref 13.0–17.0)
Immature Granulocytes: 0 %
Lymphocytes Relative: 26 %
Lymphs Abs: 2.4 10*3/uL (ref 0.7–4.0)
MCH: 30.5 pg (ref 26.0–34.0)
MCHC: 32.4 g/dL (ref 30.0–36.0)
MCV: 94.1 fL (ref 80.0–100.0)
Monocytes Absolute: 1 10*3/uL (ref 0.1–1.0)
Monocytes Relative: 11 %
Neutro Abs: 5.3 10*3/uL (ref 1.7–7.7)
Neutrophils Relative %: 58 %
Platelets: 226 10*3/uL (ref 150–400)
RBC: 4.07 MIL/uL — ABNORMAL LOW (ref 4.22–5.81)
RDW: 14 % (ref 11.5–15.5)
WBC: 9.3 10*3/uL (ref 4.0–10.5)
nRBC: 0 % (ref 0.0–0.2)

## 2019-11-10 LAB — BASIC METABOLIC PANEL
Anion gap: 8 (ref 5–15)
BUN: 26 mg/dL — ABNORMAL HIGH (ref 8–23)
CO2: 26 mmol/L (ref 22–32)
Calcium: 9.5 mg/dL (ref 8.9–10.3)
Chloride: 105 mmol/L (ref 98–111)
Creatinine, Ser: 1.05 mg/dL (ref 0.61–1.24)
GFR calc Af Amer: >60 mL/min (ref >60)
GFR calc non Af Amer: >60 mL/min (ref >60)
Glucose, Bld: 109 mg/dL — ABNORMAL HIGH (ref 70–99)
Potassium: 4.8 mmol/L (ref 3.5–5.1)
Sodium: 139 mmol/L (ref 135–145)

## 2019-11-10 LAB — HEPATIC FUNCTION PANEL
ALT: 21 U/L (ref 0–44)
AST: 21 U/L (ref 15–41)
Albumin: 4.1 g/dL (ref 3.5–5.0)
Alkaline Phosphatase: 60 U/L (ref 38–126)
Bilirubin, Direct: 0.2 mg/dL (ref 0.0–0.2)
Indirect Bilirubin: 0.6 mg/dL (ref 0.3–0.9)
Total Bilirubin: 0.8 mg/dL (ref 0.3–1.2)
Total Protein: 7.4 g/dL (ref 6.5–8.1)

## 2019-11-10 LAB — TROPONIN I (HIGH SENSITIVITY): Troponin I (High Sensitivity): 8 ng/L (ref <18)

## 2019-11-10 LAB — LIPASE, BLOOD: Lipase: 25 U/L (ref 11–51)

## 2019-11-10 MED ORDER — APIXABAN 5 MG PO TABS
5.0000 mg | ORAL_TABLET | Freq: Two times a day (BID) | ORAL | 0 refills | Status: DC
Start: 2019-11-10 — End: 2019-11-15

## 2019-11-10 MED ORDER — APIXABAN 2.5 MG PO TABS
5.0000 mg | ORAL_TABLET | Freq: Two times a day (BID) | ORAL | Status: DC
  Administered 2019-11-10: 5 mg via ORAL
  Filled 2019-11-10: qty 2

## 2019-11-10 MED ORDER — IOHEXOL 350 MG/ML SOLN
100.0000 mL | Freq: Once | INTRAVENOUS | Status: AC | PRN
  Administered 2019-11-10: 100 mL via INTRAVENOUS

## 2019-11-10 MED ORDER — LIDOCAINE 5 % EX PTCH: 1.0000 | MEDICATED_PATCH | CUTANEOUS | 0 refills | Status: DC

## 2019-11-10 MED ORDER — APIXABAN 2.5 MG PO TABS: 5.0000 mg | ORAL_TABLET | Freq: Two times a day (BID) | ORAL | Status: DC

## 2019-11-10 MED ORDER — FENTANYL CITRATE (PF) 100 MCG/2ML IJ SOLN
50.0000 ug | Freq: Once | INTRAMUSCULAR | Status: AC
  Administered 2019-11-10: 50 ug via INTRAVENOUS
  Filled 2019-11-10: qty 2

## 2019-11-10 NOTE — ED Notes (Signed)
[facility] ED nursing note: states for the past week has had back pain, rt side above rt flank area, states his pain becomes worse when taking a deep breath. Denies shortness of breath.

## 2019-11-10 NOTE — ED Provider Notes (Addendum)
[facility] EMERGENCY DEPARTMENT Provider Note   CSN: [id number] Arrival date & time: 11/10/19  1159     History Chief Complaint  Patient presents with  . Back Pain    Harry Brown is a 79 y.o. male history of CAD, AAA, diverticulitis, hypertension, hypothyroidism, peripheral neuropathy, PVD, sleep apnea, diabetes.  Patient presents today for thoracic back pain onset 7 days ago describes aching sharp pain worsened with breathing and movement, improved with rest, pain is constant nonradiating and moderate in intensity.  Denies fever/chills, cough/mopped assist, fall/injury, neck pain/sore throat, abdominal pain, nausea/vomiting, diarrhea, extremity swelling/color change, numbness/tingling, weakness, saddle paresthesias, bowel/bladder incontinence, urinary retention or any additional concerns.  HPI     Past Medical History:  Diagnosis Date  . Anemia 09/24/2013  . CAD (coronary artery disease)   . Cellulitis 05/14/2013   RLE  . Diverticulitis   . ED (erectile dysfunction)   . Glaucoma 08/18/2014  . History of sleep apnea    had surgery to correct  . Hypertension   . Hypothyroidism   . Medicare annual wellness visit, subsequent 04/13/2013  . Myocardial infarction ([place]) 11/05/1985  . Nocturia 03/15/2017  . Other and unspecified hyperlipidemia   . Penile lesion 02/24/2015  . Peripheral neuropathy 03/31/2012  . Peripheral vascular disease ([place])   . Postoperative anemia due to acute blood loss 09/24/2013  . Preventative health care 03/13/2016  . Sleep apnea 09/16/2017  . Type II diabetes mellitus (HCC)    fasting 100-120  . Urinary urgency 11/22/2012    Patient Active Problem List   Diagnosis Date Noted  . Bilateral shoulder pain 10/31/2018  . Educated about COVID-19 virus infection 10/31/2018  . Diabetes mellitus due to underlying condition with unspecified complications ([place]) 87/86/7672  . Tremor 05/02/2018  . Irregular heartbeat 09/18/2017  . Sleep apnea  09/16/2017  . Nocturia 03/15/2017  . Hypertension   . Preventative health care 03/13/2016  . Glaucoma 08/18/2014  . Need for viral immunization 09/24/2013  . Anemia 09/24/2013  . Lower back pain 05/14/2013  . Ambulatory dysfunction 05/14/2013  . Benign prostatic hyperplasia 05/14/2013  . Medicare annual wellness visit, subsequent 04/13/2013  . Abdominal aortic aneurysm ([place]) 04/03/2013  . Atherosclerosis of native arteries of extremity with intermittent claudication ([place]) 02/23/2013  . Onychomycosis 11/27/2012  . PVD (peripheral vascular disease) ([place]) 11/27/2012  . Neck pain on left side 11/22/2012  . Peripheral neuropathy 03/31/2012  . Bruit 12/29/2011  . Hypothyroidism 12/16/2011  . Hyperlipidemia 12/16/2011  . Diverticulosis 12/16/2011    Past Surgical History:  Procedure Laterality Date  . ABDOMINAL AORTAGRAM  Oct. 9, 2014   Dr. Bridgett Larsson  . ABDOMINAL AORTAGRAM N/A 03/15/2013   Procedure: ABDOMINAL Maxcine Ham;  Surgeon: Conrad Rio Grande, MD;  Location: [facility] CATH LAB;  Service: Cardiovascular;  Laterality: N/A;  . ANTERIOR CERVICAL DECOMP/DISCECTOMY FUSION  ~ 2000  . CATARACT EXTRACTION Right   . CORONARY ARTERY BYPASS GRAFT  2001   "CABG X3" (05/14/2013)  . DENTAL SURGERY     "multiple teeth removed; trmimed top of mouth so dentures would fit" (05/14/2013)  . EYE SURGERY     cataracts  . FEMORAL-POPLITEAL BYPASS GRAFT Right 04/25/2013   Procedure: RIGHT FEMORAL TO ABOVE KNEE POPLITEAL BYPASS GRAFT WITH RIGHT GREATER SAPHENOUS VEIN HARVEST; ULTRASOUND GUIDED;  Surgeon: Conrad Calmar, MD;  Location: [facility];  Service: Vascular;  Laterality: Right;  . GUM SURGERY  [phone]   "had bone scraped; had periodontal disease" (05/14/2013)  . PALATE / UVULA  BIOPSY / EXCISION  2003   Removed due to sleep apnea  . TONSILLECTOMY         Family History  Problem Relation Age of Onset  . Hyperlipidemia Mother   . Heart disease Mother   . Diabetes Mother   . Hypertension Mother   . Heart disease  Father   . Asthma Father   . Arthritis Father   . Heart disease Sister   . Heart disease Sister        s/p bypass x 2  . Lupus Sister   . Heart disease Brother        MI waiting on heart transplant when died  . Heart disease Brother        massive MI  . Cancer Neg Hx     Social History   Tobacco Use  . Smoking status: Former Smoker    Packs/day: 1.50    Years: 50.00    Pack years: 75.00    Types: Cigarettes  . Smokeless tobacco: Never Used  . Tobacco comment: 05/14/2013 "quit smoking in ~ 2004; still Uses Comit lozenges"  Substance Use Topics  . Alcohol use: No    Alcohol/week: 0.0 standard drinks  . Drug use: No    Home Medications Prior to Admission medications   Medication Sig Start Date End Date Taking? Authorizing Provider  acetaminophen (TYLENOL) 500 MG tablet 500 mg every 8 (eight) hours as needed for mild pain.     [provider]  apixaban (ELIQUIS) 5 MG TABS tablet Take 1 tablet (5 mg total) by mouth 2 (two) times daily. 11/10/19 12/10/19  Deliah Boston, PA-C  aspirin EC 81 MG tablet Take 81 mg by mouth at bedtime.    [provider]  atenolol (TENORMIN) 25 MG tablet TAKE ONE (1) TABLET BY MOUTH EVERY DAY 07/30/19   [name], MD  atorvastatin (LIPITOR) 40 MG tablet TAKE ONE (1) TABLET BY MOUTH EACH DAY AT6PM 08/30/19   [name], MD  cholecalciferol (VITAMIN D) 1000 units tablet Take 1,000 Units by mouth daily.    [provider]  finasteride (PROSCAR) 5 MG tablet TAKE ONE (1) TABLET BY MOUTH EVERY DAY 02/03/18   [name], MD  latanoprost (XALATAN) 0.005 % ophthalmic solution Place into both eyes at bedtime. 08/19/14   [provider]  levothyroxine (SYNTHROID) 75 MCG tablet TAKE ONE (1) TABLET BY MOUTH EVERY DAY 10/29/19   [name], MD  lidocaine (LIDODERM) 5 % Place 1 patch onto the skin daily. Remove & Discard patch within 12 hours or as directed by MD 11/10/19   Nuala Alpha A, PA-C  loratadine  (ALAVERT) 10 MG tablet Take 10 mg by mouth daily as needed for allergies.    [provider]  metFORMIN (GLUCOPHAGE-XR) 500 MG 24 hr tablet TAKE TWO (2) TABLETS BY MOUTH EVERY MORNING WITH BREAKFAST 10/29/19   [name], MD  Multiple Vitamin (MULTIVITAMIN WITH MINERALS) TABS tablet Take 1 tablet by mouth daily.    [provider]  nitroGLYCERIN (NITROSTAT) 0.4 MG SL tablet Place 1 tablet (0.4 mg total) under the tongue every 5 (five) minutes as needed for chest pain. 08/07/18 05/23/19  Revankar, Reita Cliche, MD  ONETOUCH DELICA LANCETS 18H MISC USE TO CHECK BLOOD SUGAR ONCE DAILY 08/02/18   [name], MD  [facility] ULTRA test strip USE 1 STRIP DAILY 06/25/19   [name], MD  tamsulosin (FLOMAX) 0.4 MG CAPS capsule TAKE ONE  CAPSULE BY MOUTH DAILY 08/30/19   [name], MD    Allergies    Adhesive [tape], Latex, and Other  Review of Systems   Review of Systems Ten systems are reviewed and are negative for acute change except as noted in the HPI  Physical Exam Updated Vital Signs BP (!) 158/70 (BP Location: Right Arm)   Pulse 78   Temp 98.2 F (36.8 C) (Oral)   Resp 18   Ht 6' (1.829 m)   Wt 86.2 kg   SpO2 98%   BMI 25.77 kg/m   Physical Exam Constitutional:      General: He is not in acute distress.    Appearance: Normal appearance. He is well-developed. He is not ill-appearing or diaphoretic.  HENT:     Head: Normocephalic and atraumatic.  Eyes:     General: Vision grossly intact. Gaze aligned appropriately.     Pupils: Pupils are equal, round, and reactive to light.  Neck:     Trachea: Trachea and phonation normal.  Cardiovascular:     Rate and Rhythm: Normal rate and regular rhythm.     Pulses: Normal pulses.  Pulmonary:     Effort: Pulmonary effort is normal. No respiratory distress.  Abdominal:     General: There is no distension.     Palpations: Abdomen is soft.     Tenderness: There is no abdominal tenderness. There is no guarding or  rebound.  Musculoskeletal:        General: Normal range of motion.     Cervical back: Normal range of motion.     Comments: No midline C/T/L spinal tenderness to palpation, no paraspinal muscle tenderness, no deformity, crepitus, or step-off noted. No sign of injury to the neck or back.  Skin:    General: Skin is warm and dry.  Neurological:     Mental Status: He is alert.     GCS: GCS eye subscore is 4. GCS verbal subscore is 5. GCS motor subscore is 6.     Comments: Speech is clear and goal oriented, follows commands Major Cranial nerves without deficit, no facial droop Moves extremities without ataxia, coordination intact  Psychiatric:        Behavior: Behavior normal.     ED Results / Procedures / Treatments   Labs (all labs ordered are listed, but only abnormal results are displayed) Labs Reviewed  CBC WITH DIFFERENTIAL/PLATELET - Abnormal; Notable for the following components:      Result Value   RBC 4.07 (*)    Hemoglobin 12.4 (*)    HCT 38.3 (*)    All other components within normal limits  BASIC METABOLIC PANEL - Abnormal; Notable for the following components:   Glucose, Bld 109 (*)    BUN 26 (*)    All other components within normal limits  LIPASE, BLOOD  HEPATIC FUNCTION PANEL  TROPONIN I (HIGH SENSITIVITY)    EKG EKG Interpretation  Date/Time:  Saturday November 10 2019 12:13:03 EDT Ventricular Rate:  71 PR Interval:    QRS Duration: 94 QT Interval:  410 QTC Calculation: 445 R Axis:   37 Text Interpretation: no clearly discernable p waves, indeterminate rhythm, suspect sinus no acute ST segment changes or new T wave inversion Reconfirmed by Madalyn Rob [phone]) on 11/10/2019 1:20:39 PM   Radiology CT Angio Chest/Abd/Pel for Dissection W and/or Wo Contrast  Result Date: 11/10/2019 CLINICAL DATA:  Mid back pain. Symptoms for 1 week. Pain in the RIGHT side above the RIGHT  flank area. Pain is worse with deep breath. EXAM: CT ANGIOGRAPHY CHEST, ABDOMEN AND  PELVIS TECHNIQUE: Non-contrast CT of the chest was initially obtained. Multidetector CT imaging through the chest, abdomen and pelvis was performed using the standard protocol during bolus administration of intravenous contrast. Multiplanar reconstructed images and MIPs were obtained and reviewed to evaluate the vascular anatomy. CONTRAST:  149mL OMNIPAQUE IOHEXOL 350 MG/ML SOLN COMPARISON:  CT angio chest on 10/12/2019, CT pelvis on 05/15/2013 FINDINGS: CTA CHEST FINDINGS Cardiovascular: Heart size is normal. There is extensive coronary artery calcification. No pericardial effusion. There is atherosclerotic calcification of the thoracic aorta. Ascending aorta is 4.4 centimeters. No dissection. The pulmonary arteries are well opacified accounting for contrast bolus timing. Mediastinum/Nodes: Esophagus is normal in appearance. The visualized portion of the thyroid gland has a normal appearance. Small mediastinal lymph nodes are present, measuring less than 1 centimeter in diameter. No significant hilar adenopathy. Lungs/Pleura: There are small bilateral pleural effusions, LEFT greater than RIGHT. 3 millimeter nodule is identified within the RIGHT middle lobe on image 42 of series 8. There is minimal dependent atelectasis and mild interstitial pulmonary edema. No focal consolidations. Musculoskeletal: Degenerative changes are present in the thoracic spine. Status post median sternotomy. Review of the MIP images confirms the above findings. CTA ABDOMEN AND PELVIS FINDINGS VASCULAR Aorta: There is atherosclerotic calcification of the abdominal aorta. Infrarenal abdominal aortic aneurysm is 2.7 x 3.0 centimeters. No dissection. Celiac: Minimal atherosclerotic calcification at the origin. No significant stenosis. SMA: Minimal atherosclerotic calcification at the origin. No significant stenosis. Renals: Single bilateral renal arteries are patent. There is atherosclerotic calcification of the origin of the RIGHT renal  artery. No significant stenosis. IMA: Patent without evidence of aneurysm, dissection, vasculitis or significant stenosis. Inflow: Patent without evidence of aneurysm, dissection, vasculitis or significant stenosis. Veins: No obvious venous abnormality within the limitations of this arterial phase study. Review of the MIP images confirms the above findings. NON-VASCULAR Hepatobiliary: No focal liver abnormality is seen. No radiopaque gallstones, biliary dilatation, or pericholecystic inflammatory changes. Pancreas: Unremarkable. No pancreatic ductal dilatation or surrounding inflammatory changes. Spleen: Normal in size without focal abnormality. Adrenals/Urinary Tract: Adrenal glands are unremarkable. Kidneys are normal, without renal calculi, focal lesion, or hydronephrosis. Bladder is unremarkable. Stomach/Bowel: Stomach and small bowel loops are normal in appearance. Moderate stool burden, particularly within the rectosigmoid. Redundant colon with descending colonic diverticula. No evidence for acute diverticulitis. Normal appendix. Lymphatic: No retroperitoneal or mesenteric adenopathy. Reproductive: Prostate is mildly enlarged, with impression on the bladder base. Other: Anterior abdominal wall is unremarkable.  No ascites. Musculoskeletal: Degenerative changes are identified in the lumbar spine. No acute osseous abnormality. Review of the MIP images confirms the above findings. IMPRESSION: 1. Infrarenal abdominal aortic aneurysm 3.0 centimeters. Recommend followup by ultrasound in 3 years. This recommendation follows ACR consensus guidelines: White Paper of the ACR Incidental Findings Committee II on Vascular Findings. J Am Coll Radiol 2013; 10:789-794. Aortic aneurysm NOS (ICD10-I71.9) 2. Ascending thoracic aortic aneurysm 4.4 centimeters. Recommend annual imaging followup by CTA or MRA. This recommendation follows 2010 ACCF/AHA/AATS/ACR/ASA/SCA/SCAI/SIR/STS/SVM Guidelines for the Diagnosis and Management of  Patients with Thoracic Aortic Disease. Circulation. 2010; 121: K270-W237. Aortic aneurysm NOS (ICD10-I71.9) 3. No dissection. 4. Mild interstitial pulmonary edema.  And small pleural effusions. 5. Coronary artery disease. 6. Redundant colon with descending colonic diverticula. No acute diverticulitis. Moderate stool burden. 7. Mildly enlarged prostate gland. 8. Degenerative changes in the thoracic and lumbar spine. 9. Aortic Atherosclerosis (ICD10-I70.0). Electronically Signed   By:  Nolon Nations M.D.   On: 11/10/2019 15:43    Procedures Procedures (including critical care time)  Medications Ordered in ED Medications  apixaban (ELIQUIS) tablet 5 mg (5 mg Oral Given 11/10/19 1816)  fentaNYL (SUBLIMAZE) injection 50 mcg (50 mcg Intravenous Given 11/10/19 1445)  iohexol (OMNIPAQUE) 350 MG/ML injection 100 mL (100 mLs Intravenous Contrast Given 11/10/19 1452)    ED Course  I have reviewed the triage vital signs and the nursing notes.  Pertinent labs & imaging results that were available during my care of the patient were reviewed by me and considered in my medical decision making (see chart for details).  Clinical Course as of Nov 09 1820  Sat Nov 10, 2019  1522 Discuss risks of a/c and document. Offer a/c for stroke prevention Elqis vs xarelto or discuss with PCP   [BM]  0175 Afib clinic referral (am ref afib)   [BM]    Clinical Course User Index [BM] Gari Crown   MDM Rules/Calculators/A&P                     Additional History Obtained: 1. Nursing notes from this visit. 2. Chart review shows patient is followed by vascular surgery for thoracic and abdominal aneurysms. - 79 year old male with history as detailed above presents today for thoracic back pain atraumatic ongoing for 1 week.  Chest pain work-up including troponin was ordered in triage.  Pain not reproducible on my exam, concern for possible dissection, CT angio dissection study was ordered.  Additionally patient  noted to be hypertensive with blood pressure in the 180s, suspect may be secondary to pain, fentanyl ordered.  Discussed case with Dr. Roslynn Amble who agrees with plan. - I ordered, reviewed and interpreted labs which include: High sensitive troponin within normal limits, with ongoing symptoms for 1 week no indication for delta troponin, no chest pain doubt ACS. Lipase within normal limits doubt pancreatitis. LFTs within normal limits. CBC shows no leukocytosis suggest infection, hemoglobin of 12.4 appears baseline.  EKG: no clearly discernable p waves, indeterminate rhythm, suspect sinus no acute ST segment changes or new T wave inversion Reconfirmed by Madalyn Rob [phone]) on 11/10/2019 1:20:39 PM  CT Angio Dissection Study:  IMPRESSION:  1. Infrarenal abdominal aortic aneurysm 3.0 centimeters. Recommend  followup by ultrasound in 3 years. This recommendation follows ACR  consensus guidelines: White Paper of the ACR Incidental Findings  Committee II on Vascular Findings. J Am Coll Radiol 2013;  10:789-794. Aortic aneurysm NOS (ICD10-I71.9)  2. Ascending thoracic aortic aneurysm 4.4 centimeters. Recommend  annual imaging followup by CTA or MRA. This recommendation follows  2010 ACCF/AHA/AATS/ACR/ASA/SCA/SCAI/SIR/STS/SVM Guidelines for the  Diagnosis and Management of Patients with Thoracic Aortic Disease.  Circulation. 2010; 121: N277-O242. Aortic aneurysm NOS (ICD10-I71.9)  3. No dissection.  4. Mild interstitial pulmonary edema. And small pleural effusions.  5. Coronary artery disease.  6. Redundant colon with descending colonic diverticula. No acute  diverticulitis. Moderate stool burden.  7. Mildly enlarged prostate gland.  8. Degenerative changes in the thoracic and lumbar spine.  9. Aortic Atherosclerosis (ICD10-I70.0).  Findings discussed with patient, advised of PCP, vascular surgery follow-up.  CHA2DS2-VASc Score = 5 5 The patient's score is based upon: CHF History: 0 HTN  History: 1 Age : 2 Diabetes History: 1 Stroke History: 0 Vascular Disease History: 1 Gender: 0      ASSESSMENT AND PLAN:  New onset Atrial Fibrillation  Signed,  Deliah Boston, PA-C    11/10/2019  6:22 PM   ======================== Patient seen and evaluated by Dr. Roslynn Amble, uspect patient with muscular etiology of his back pain today.  Will start patient on lidoderm to help with his symptoms.  Incidentally patient noted to have new atrial fibrillation with chads vas score of 5.  Shared decision making was made with patient about risks versus benefits of anticoagulation.  Patient wishes to proceed with anticoagulation, Eliquis.  He denies any history bleeding event, GI bleeding, stroke, alcohol use or other high risk factors.  Will give patient Eliquis starter pack and referred to A. fib clinic. Education packet provided by nursing staff. Return precautions discussed at length with patient regarding the increased risk of bleeding with Eliquis and he states understanding.  At this time there does not appear to be any evidence of an acute emergency medical condition and the patient appears stable for discharge with appropriate outpatient follow up. Diagnosis was discussed with patient who verbalizes understanding of care plan and is agreeable to discharge. I have discussed return precautions with patient who verbalizes understanding. Patient encouraged to follow-up with their PCP and Afib clinic. All questions answered.  Patient seen and evaluated by Dr. Roslynn Amble during this visit agrees with discharge with muscle relaxer, Eliquis and Afib clinic follow-up.  Note: Portions of this report may have been transcribed using voice recognition software. Every effort was made to ensure accuracy; however, inadvertent computerized transcription errors may still be present. Final Clinical Impression(s) / ED Diagnoses Final diagnoses:  Atrial fibrillation, new onset [facility])  Musculoskeletal back pain     Rx / DC Orders ED Discharge Orders         Ordered    Amb Referral to AFIB Clinic     11/10/19 1758    lidocaine (LIDODERM) 5 %  Every 24 hours     11/10/19 1817    apixaban (ELIQUIS) 5 MG TABS tablet  2 times daily     11/10/19 1817           Deliah Boston, PA-C 11/10/19 1821    Deliah Boston, PA-C 11/10/19 Stanton Kidney, MD 11/11/19 [phone]

## 2019-11-10 NOTE — ED Triage Notes (Signed)
Thoracic back x 1 week, hurts worse with inspiration.

## 2019-11-10 NOTE — Discharge Instructions (Addendum)
At this time there does not appear to be the presence of an emergent medical condition, however there is always the potential for conditions to change. Please read and follow the below instructions.  Please return to the Emergency Department immediately for any new or worsening symptoms. Please be sure to follow up with your Primary Care Provider within one week regarding your visit today; please call their office to schedule an appointment even if you are feeling better for a follow-up visit. You may use the muscle relaxer Robaxin as prescribed to help with your symptoms.  Do not drive or operate heavy machinery while taking Robaxin as it will make you drowsy.  Do not drink alcohol or take other sedating medications while taking Robaxin as this will worsen side effects. You are being started on a new medication called Eliquis today.  This medication is a blood thinner.  If you have any injury, such as hitting your head, you must immediately call 911 and return to the emergency department for evaluation as you are at a high risk of bleeding. You will be called by the A. fib clinic at [facility] health for a follow-up visit regarding your new atrial fibrillation diagnosis.  If you do not receive a call from them in the next 2 days call the number on your discharge paperwork to schedule an appointment. Your CT scan today showed coronary artery calcifications, a sending aorta of 4.4 cm, small mediastinal lymph nodes, small bilateral pleural effusions, nodules within your lungs, atelectasis, degenerative changes of the spine, abdominal aorta of 2.7 x 3 cm, atherosclerosis, diverticula, mild prostate enlargement.  Please discuss these findings with your primary care provider at your follow-up visit.  You will need repeat imaging of your aorta by CT scan annually.  Discussed this with your vascular surgeon at your follow-up visit.  Call 911 and get help right away if: You have pain in your chest or your belly  (abdomen). You have trouble breathing. You have side effects of blood thinners, such as blood in your vomit, poop (stool), or pee (urine), or bleeding that cannot stop. You have any signs of a stroke. "BE FAST" is an easy way to remember the main warning signs: B - Balance. Signs are dizziness, sudden trouble walking, or loss of balance. E - Eyes. Signs are trouble seeing or a change in how you see. F - Face. Signs are sudden weakness or loss of feeling in the face, or the face or eyelid drooping on one side. A - Arms. Signs are weakness or loss of feeling in an arm. This happens suddenly and usually on one side of the body. S - Speech. Signs are sudden trouble speaking, slurred speech, or trouble understanding what people say. T - Time. Time to call emergency services. Write down what time symptoms started. You have other signs of a stroke, such as: A sudden, very bad headache with no known cause. Feeling like you may vomit (nausea). Vomiting. A seizure. You have any new/concerning or worsening of symptoms  Please read the additional information packets attached to your discharge summary.  Do not take your medicine if  develop an itchy rash, swelling in your mouth or lips, or difficulty breathing; call 911 and seek immediate emergency medical attention if this occurs.  Note: Portions of this text may have been transcribed using voice recognition software. Every effort was made to ensure accuracy; however, inadvertent computerized transcription errors may still be present.

## 2019-11-13 ENCOUNTER — Other Ambulatory Visit: Payer: Self-pay

## 2019-11-13 ENCOUNTER — Ambulatory Visit (HOSPITAL_COMMUNITY)
Admission: RE | Admit: 2019-11-13 | Discharge: 2019-11-13 | Disposition: A | Discharge status: Home / Self Care | Payer: Medicare Other | Source: Ambulatory Visit | Attending: Nurse Practitioner | Admitting: Nurse Practitioner

## 2019-11-13 VITALS — BP 190/80 | HR 70 | Ht 72.0 in | Wt 200.2 lb

## 2019-11-13 DIAGNOSIS — Z833 Family history of diabetes mellitus: Secondary | ICD-10-CM | POA: Diagnosis not present

## 2019-11-13 DIAGNOSIS — Z7901 Long term (current) use of anticoagulants: Secondary | ICD-10-CM | POA: Insufficient documentation

## 2019-11-13 DIAGNOSIS — Z7984 Long term (current) use of oral hypoglycemic drugs: Secondary | ICD-10-CM | POA: Insufficient documentation

## 2019-11-13 DIAGNOSIS — I4819 Other persistent atrial fibrillation: Secondary | ICD-10-CM | POA: Insufficient documentation

## 2019-11-13 DIAGNOSIS — Z8249 Family history of ischemic heart disease and other diseases of the circulatory system: Secondary | ICD-10-CM | POA: Diagnosis not present

## 2019-11-13 DIAGNOSIS — I252 Old myocardial infarction: Secondary | ICD-10-CM | POA: Diagnosis not present

## 2019-11-13 DIAGNOSIS — E1151 Type 2 diabetes mellitus with diabetic peripheral angiopathy without gangrene: Secondary | ICD-10-CM | POA: Diagnosis not present

## 2019-11-13 DIAGNOSIS — Z791 Long term (current) use of non-steroidal anti-inflammatories (NSAID): Secondary | ICD-10-CM | POA: Diagnosis not present

## 2019-11-13 DIAGNOSIS — Z87891 Personal history of nicotine dependence: Secondary | ICD-10-CM | POA: Insufficient documentation

## 2019-11-13 DIAGNOSIS — I714 Abdominal aortic aneurysm, without rupture: Secondary | ICD-10-CM | POA: Insufficient documentation

## 2019-11-13 DIAGNOSIS — Z79899 Other long term (current) drug therapy: Secondary | ICD-10-CM | POA: Insufficient documentation

## 2019-11-13 DIAGNOSIS — E039 Hypothyroidism, unspecified: Secondary | ICD-10-CM | POA: Diagnosis not present

## 2019-11-13 DIAGNOSIS — E7849 Other hyperlipidemia: Secondary | ICD-10-CM | POA: Diagnosis not present

## 2019-11-13 DIAGNOSIS — I2510 Atherosclerotic heart disease of native coronary artery without angina pectoris: Secondary | ICD-10-CM | POA: Insufficient documentation

## 2019-11-13 DIAGNOSIS — I4891 Unspecified atrial fibrillation: Secondary | ICD-10-CM | POA: Diagnosis not present

## 2019-11-13 DIAGNOSIS — Z8349 Family history of other endocrine, nutritional and metabolic diseases: Secondary | ICD-10-CM | POA: Insufficient documentation

## 2019-11-13 DIAGNOSIS — I10 Essential (primary) hypertension: Secondary | ICD-10-CM | POA: Diagnosis not present

## 2019-11-13 DIAGNOSIS — Z79890 Hormone replacement therapy (postmenopausal): Secondary | ICD-10-CM | POA: Diagnosis not present

## 2019-11-13 DIAGNOSIS — D6869 Other thrombophilia: Secondary | ICD-10-CM

## 2019-11-13 DIAGNOSIS — E1142 Type 2 diabetes mellitus with diabetic polyneuropathy: Secondary | ICD-10-CM | POA: Diagnosis not present

## 2019-11-13 MED ORDER — LOSARTAN POTASSIUM 25 MG PO TABS
25.0000 mg | ORAL_TABLET | Freq: Every day | ORAL | 3 refills | Status: DC
Start: 2019-11-13 — End: 2020-02-07

## 2019-11-13 NOTE — Patient Instructions (Signed)
Eliquis 5mg  twice a day  Start Losartan 25mg  once a day

## 2019-11-14 ENCOUNTER — Encounter (HOSPITAL_COMMUNITY): Payer: Self-pay | Admitting: Nurse Practitioner

## 2019-11-14 NOTE — Progress Notes (Signed)
Primary Care Physician: [name], MD Referring Physician: [facility] f/u Cardiologist: Dr. Levy Pupa Harry Brown is a 79 y.o. male with a h/o HTN, AAA, hypothyroidism, CAD, DM2, PVD, that was recently seen in the ER for sharp positive chest pain worse with movement or deep breathing. He was incidentally found to be in asymptomatic new onset afib. Back pain was thought to be musculoskeletal in origin. He was started on an Eliquis  5 mg bid starter pack. Since he was prescribed a starter pack(for DVT/PE pt's,  NOT afib, he has received double amount DOAC than needed. He will drop back to 5 mg bid starting tonight. He is not really symptomatic with afib. He is rate controlled but his BP is elevated today. Will probably ned a cardioversion after 21 days of anticoagulation.   Today, he denies symptoms of palpitations, chest pain, shortness of breath, orthopnea, PND, lower extremity edema, dizziness, presyncope, syncope, or neurologic sequela. The patient is tolerating medications without difficulties and is otherwise without complaint today.   Past Medical History:  Diagnosis Date  . Anemia 09/24/2013  . CAD (coronary artery disease)   . Cellulitis 05/14/2013   RLE  . Diverticulitis   . ED (erectile dysfunction)   . Glaucoma 08/18/2014  . History of sleep apnea    had surgery to correct  . Hypertension   . Hypothyroidism   . Medicare annual wellness visit, subsequent 04/13/2013  . Myocardial infarction ([place]) 11/05/1985  . Nocturia 03/15/2017  . Other and unspecified hyperlipidemia   . Penile lesion 02/24/2015  . Peripheral neuropathy 03/31/2012  . Peripheral vascular disease ([place])   . Postoperative anemia due to acute blood loss 09/24/2013  . Preventative health care 03/13/2016  . Sleep apnea 09/16/2017  . Type II diabetes mellitus ([facility])    fasting 100-120  . Urinary urgency 11/22/2012   Past Surgical History:  Procedure Laterality Date  . ABDOMINAL AORTAGRAM  Oct. 9, 2014   Dr.  Bridgett Larsson  . ABDOMINAL AORTAGRAM N/A 03/15/2013   Procedure: ABDOMINAL Maxcine Ham;  Surgeon: Conrad [place], MD;  Location: [facility] CATH LAB;  Service: Cardiovascular;  Laterality: N/A;  . ANTERIOR CERVICAL DECOMP/DISCECTOMY FUSION  ~ 2000  . CATARACT EXTRACTION Right   . CORONARY ARTERY BYPASS GRAFT  2001   "CABG X3" (05/14/2013)  . DENTAL SURGERY     "multiple teeth removed; trmimed top of mouth so dentures would fit" (05/14/2013)  . EYE SURGERY     cataracts  . FEMORAL-POPLITEAL BYPASS GRAFT Right 04/25/2013   Procedure: RIGHT FEMORAL TO ABOVE KNEE POPLITEAL BYPASS GRAFT WITH RIGHT GREATER SAPHENOUS VEIN HARVEST; ULTRASOUND GUIDED;  Surgeon: Conrad Point, MD;  Location: [facility] OR;  Service: Vascular;  Laterality: Right;  . GUM SURGERY  [phone]   "had bone scraped; had periodontal disease" (05/14/2013)  . PALATE / UVULA BIOPSY / EXCISION  2003   Removed due to sleep apnea  . TONSILLECTOMY      Current Outpatient Medications  Medication Sig Dispense Refill  . apixaban (ELIQUIS) 5 MG TABS tablet Take 1 tablet (5 mg total) by mouth 2 (two) times daily. 60 tablet 0  . atenolol (TENORMIN) 25 MG tablet TAKE ONE (1) TABLET BY MOUTH EVERY DAY 30 tablet 3  . atorvastatin (LIPITOR) 40 MG tablet TAKE ONE (1) TABLET BY MOUTH EACH DAY AT6PM 30 tablet 11  . cholecalciferol (VITAMIN D) 1000 units tablet Take 1,000 Units by mouth daily.    . finasteride (PROSCAR) 5 MG tablet  TAKE ONE (1) TABLET BY MOUTH EVERY DAY 30 tablet 2  . latanoprost (XALATAN) 0.005 % ophthalmic solution Place 1 drop into both eyes at bedtime.     Marland Kitchen levothyroxine (SYNTHROID) 75 MCG tablet TAKE ONE (1) TABLET BY MOUTH EVERY DAY 90 tablet 0  . loratadine (ALAVERT) 10 MG tablet Take 10 mg by mouth daily as needed for allergies.    . metFORMIN (GLUCOPHAGE-XR) 500 MG 24 hr tablet TAKE TWO (2) TABLETS BY MOUTH EVERY MORNING WITH BREAKFAST 60 tablet 0  . Multiple Vitamin (MULTIVITAMIN WITH MINERALS) TABS tablet Take 1 tablet by mouth daily.    . naproxen  sodium (ALEVE) 220 MG tablet Take 220 mg by mouth as needed.    . nitroGLYCERIN (NITROSTAT) 0.4 MG SL tablet Place 1 tablet (0.4 mg total) under the tongue every 5 (five) minutes as needed for chest pain. 25 tablet 11  . ONETOUCH DELICA LANCETS 54M MISC USE TO CHECK BLOOD SUGAR ONCE DAILY 100 each 1  . ONETOUCH ULTRA test strip USE 1 STRIP DAILY 100 each 0  . tamsulosin (FLOMAX) 0.4 MG CAPS capsule TAKE ONE CAPSULE BY MOUTH DAILY 30 capsule 2  . lidocaine (LIDODERM) 5 % Place 1 patch onto the skin daily. Remove & Discard patch within 12 hours or as directed by MD (Patient not taking: Reported on 11/13/2019) 15 patch 0  . losartan (COZAAR) 25 MG tablet Take 1 tablet (25 mg total) by mouth daily. 30 tablet 3   No current facility-administered medications for this encounter.    Allergies  Allergen Reactions  . Adhesive [Tape]   . Latex     rash  . Other Hives    Plastic shopping bags Metal staples: rash, swelling    Social History   Socioeconomic History  . Marital status: Married    Spouse name: Not on file  . Number of children: 8   . Years of education: Not on file  . Highest education level: Not on file  Occupational History    Employer: [employer]  Tobacco Use  . Smoking status: Former Smoker    Packs/day: 1.50    Years: 50.00    Pack years: 75.00    Types: Cigarettes  . Smokeless tobacco: Never Used  . Tobacco comment: 05/14/2013 "quit smoking in ~ 2004; still Uses Comit lozenges"  Substance and Sexual Activity  . Alcohol use: No    Alcohol/week: 0.0 standard drinks  . Drug use: No  . Sexual activity: Yes    Comment: lives with wife, retiring end of next week, no dietary restrictions  Other Topics Concern  . 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 Difficulty of Paying Living Expenses:   Food Insecurity:   . Worried  About Charity fundraiser in the Last Year:   . Arboriculturist in the Last Year:   Transportation Needs:   . Film/video editor (Medical):   Marland Kitchen Lack of Transportation (Non-Medical):   Physical Activity:   . Days of Exercise per Week:   . Minutes of Exercise per Session:   Stress:   . Feeling of Stress :   Social Connections:   . Frequency of Communication with Friends and Family:   . Frequency of Social Gatherings with Friends and Family:   . Attends Religious Services:   .  Active Member of Clubs or Organizations:   . Attends Archivist Meetings:   Marland Kitchen Marital Status:   Intimate Partner Violence:   . Fear of Current or Ex-Partner:   . Emotionally Abused:   Marland Kitchen Physically Abused:   . Sexually Abused:     Family History  Problem Relation Age of Onset  . Hyperlipidemia Mother   . Heart disease Mother   . Diabetes Mother   . Hypertension Mother   . Heart disease Father   . Asthma Father   . Arthritis Father   . Heart disease Sister   . Heart disease Sister        s/p bypass x 2  . Lupus Sister   . Heart disease Brother        MI waiting on heart transplant when died  . Heart disease Brother        massive MI  . Cancer Neg Hx     ROS- All systems are reviewed and negative except as per the HPI above  Physical Exam: Vitals:   11/13/19 1413  BP: (!) 190/80  Pulse: 70  Weight: 90.8 kg  Height: 6' (1.829 m)   Wt Readings from Last 3 Encounters:  11/13/19 90.8 kg  11/10/19 86.2 kg  08/07/19 89.9 kg    Labs: Lab Results  Component Value Date   NA 139 11/10/2019   K 4.8 11/10/2019   CL 105 11/10/2019   CO2 26 11/10/2019   GLUCOSE 109 (H) 11/10/2019   BUN 26 (H) 11/10/2019   CREATININE 1.05 11/10/2019   CALCIUM 9.5 11/10/2019   PHOS 3.7 07/24/2014   Lab Results  Component Value Date   INR 0.99 04/20/2013   Lab Results  Component Value Date   CHOL 122 09/08/2018   HDL 45 09/08/2018   LDLCALC 67 09/08/2018   TRIG 48 09/08/2018     GEN-  The patient is well appearing, alert and oriented x 3 today.   Head- normocephalic, atraumatic Eyes-  Sclera clear, conjunctiva pink Ears- hearing intact Oropharynx- clear Neck- supple, no JVP Lymph- no cervical lymphadenopathy Lungs- Clear to ausculation bilaterally, normal work of breathing Heart- irregular rate and rhythm, no murmurs, rubs or gallops, PMI not laterally displaced GI- soft, NT, ND, + BS Extremities- no clubbing, cyanosis, or edema MS- no significant deformity or atrophy Skin- no rash or lesion Psych- euthymic mood, full affect Neuro- strength and sensation are intact  EKG-afib at 70 bpm  Echo-08/10/18-1. The left ventricle has normal systolic function with an ejection  fraction of 60-65%. The cavity size was normal. Left ventricular diastolic  Doppler parameters are consistent with pseudonormalization.  2. The right ventricle has normal systolic function. The cavity was  normal. There is no increase in right ventricular wall thickness.  3. The mitral valve is normal in structure. There is mild mitral annular  calcification present.  4. The tricuspid valve is normal in structure.  5. The aortic valve is normal in structure. Mild sclerosis of the aortic  valve.  6. The pulmonic valve was normal in structure.  7. There is moderate dilatation of the ascending aorta measuring 42 mm.     Assessment and Plan: 1.New onset persistent afib Unknown duration Asymptomatic and rate controlled Continue  atenolol 25 mg a day General education and triggers discussed   2. CHA2DS2VASc score of at least 6  Pt was given a Eliquis started kit and has been taking eliquis 10 mg bid This is meant for the  DVT/PE pt, they  require a higher loading dose of drug up front He has been instructed to reduce to 5 mg bid  After 3 weeks of anticoagulation, can discuss cardioversion if remains in persistent afib  Bleeding  precautions discussed   3.HTN Poorly controlled today  Will  start losartan  25 mg today He sees his PCP later this week and can be uptitrated if needed  I will see back in 10 days   Butch Penny C. Haidynn Almendarez, [facility] [address] [place], [place] [address] [phone]

## 2019-11-15 ENCOUNTER — Other Ambulatory Visit: Payer: Self-pay

## 2019-11-15 ENCOUNTER — Ambulatory Visit (INDEPENDENT_AMBULATORY_CARE_PROVIDER_SITE_OTHER): Payer: Medicare Other | Admitting: Family Medicine

## 2019-11-15 VITALS — BP 121/52 | HR 72 | Temp 98.5°F | Resp 12 | Ht 72.0 in | Wt 197.8 lb

## 2019-11-15 DIAGNOSIS — R350 Frequency of micturition: Secondary | ICD-10-CM

## 2019-11-15 DIAGNOSIS — E088 Diabetes mellitus due to underlying condition with unspecified complications: Secondary | ICD-10-CM

## 2019-11-15 DIAGNOSIS — E782 Mixed hyperlipidemia: Secondary | ICD-10-CM | POA: Diagnosis not present

## 2019-11-15 DIAGNOSIS — E039 Hypothyroidism, unspecified: Secondary | ICD-10-CM | POA: Diagnosis not present

## 2019-11-15 DIAGNOSIS — E118 Type 2 diabetes mellitus with unspecified complications: Secondary | ICD-10-CM | POA: Diagnosis not present

## 2019-11-15 DIAGNOSIS — I10 Essential (primary) hypertension: Secondary | ICD-10-CM | POA: Diagnosis not present

## 2019-11-15 DIAGNOSIS — D649 Anemia, unspecified: Secondary | ICD-10-CM

## 2019-11-15 DIAGNOSIS — I4891 Unspecified atrial fibrillation: Secondary | ICD-10-CM

## 2019-11-15 LAB — URINALYSIS
Bilirubin Urine: NEGATIVE
Hgb urine dipstick: NEGATIVE
Ketones, ur: NEGATIVE
Leukocytes,Ua: NEGATIVE
Nitrite: NEGATIVE
Specific Gravity, Urine: 1.01 (ref 1.000–1.030)
Total Protein, Urine: NEGATIVE
Urine Glucose: NEGATIVE
Urobilinogen, UA: 1 (ref 0.0–1.0)
pH: 6 (ref 5.0–8.0)

## 2019-11-15 LAB — LIPID PANEL
Cholesterol: 116 mg/dL (ref 0–200)
HDL: 40.6 mg/dL (ref >39.00)
LDL Cholesterol: 67 mg/dL (ref 0–99)
NonHDL: 75.76
Total CHOL/HDL Ratio: 3
Triglycerides: 46 mg/dL (ref 0.0–149.0)
VLDL: 9.2 mg/dL (ref 0.0–40.0)

## 2019-11-15 LAB — CBC WITH DIFFERENTIAL/PLATELET
Basophils Absolute: 0.1 10*3/uL (ref 0.0–0.1)
Basophils Relative: 0.6 % (ref 0.0–3.0)
Eosinophils Absolute: 0.3 10*3/uL (ref 0.0–0.7)
Eosinophils Relative: 3.3 % (ref 0.0–5.0)
HCT: 37.2 % — ABNORMAL LOW (ref 39.0–52.0)
Hemoglobin: 12.2 g/dL — ABNORMAL LOW (ref 13.0–17.0)
Lymphocytes Relative: 23.1 % (ref 12.0–46.0)
Lymphs Abs: 1.9 10*3/uL (ref 0.7–4.0)
MCHC: 32.9 g/dL (ref 30.0–36.0)
MCV: 93.5 fl (ref 78.0–100.0)
Monocytes Absolute: 0.8 10*3/uL (ref 0.1–1.0)
Monocytes Relative: 10.2 % (ref 3.0–12.0)
Neutro Abs: 5.1 10*3/uL (ref 1.4–7.7)
Neutrophils Relative %: 62.8 % (ref 43.0–77.0)
Platelets: 226 10*3/uL (ref 150.0–400.0)
RBC: 3.98 Mil/uL — ABNORMAL LOW (ref 4.22–5.81)
RDW: 14.6 % (ref 11.5–15.5)
WBC: 8.1 10*3/uL (ref 4.0–10.5)

## 2019-11-15 LAB — COMPREHENSIVE METABOLIC PANEL
ALT: 15 U/L (ref 0–53)
AST: 15 U/L (ref 0–37)
Albumin: 4 g/dL (ref 3.5–5.2)
Alkaline Phosphatase: 63 U/L (ref 39–117)
BUN: 25 mg/dL — ABNORMAL HIGH (ref 6–23)
CO2: 30 mEq/L (ref 19–32)
Calcium: 9.5 mg/dL (ref 8.4–10.5)
Chloride: 105 mEq/L (ref 96–112)
Creatinine, Ser: 1.03 mg/dL (ref 0.40–1.50)
GFR: 69.49 mL/min (ref >60.00)
Glucose, Bld: 112 mg/dL — ABNORMAL HIGH (ref 70–99)
Potassium: 4.6 mEq/L (ref 3.5–5.1)
Sodium: 138 mEq/L (ref 135–145)
Total Bilirubin: 0.6 mg/dL (ref 0.2–1.2)
Total Protein: 6.5 g/dL (ref 6.0–8.3)

## 2019-11-15 LAB — TSH: TSH: 5.31 u[IU]/mL — ABNORMAL HIGH (ref 0.35–4.50)

## 2019-11-15 MED ORDER — APIXABAN 5 MG PO TABS: 5.0000 mg | ORAL_TABLET | Freq: Two times a day (BID) | ORAL | 0 refills | Status: DC

## 2019-11-15 NOTE — Patient Instructions (Signed)
Acute Back Pain, Adult Acute back pain is sudden and usually short-lived. It is often caused by an injury to the muscles and tissues in the back. The injury may result from:  A muscle or ligament getting overstretched or torn (strained). Ligaments are tissues that connect bones to each other. Lifting something improperly can cause a back strain.  Wear and tear (degeneration) of the spinal disks. Spinal disks are circular tissue that provides cushioning between the bones of the spine (vertebrae).  Twisting motions, such as while playing sports or doing yard work.  A hit to the back.  Arthritis. You may have a physical exam, lab tests, and imaging tests to find the cause of your pain. Acute back pain usually goes away with rest and home care. Follow these instructions at home: Managing pain, stiffness, and swelling  Take over-the-counter and prescription medicines only as told by your health care provider.  Your health care provider may recommend applying ice during the first 24-48 hours after your pain starts. To do this: ? Put ice in a plastic bag. ? Place a towel between your skin and the bag. ? Leave the ice on for 20 minutes, 2-3 times a day.  If directed, apply heat to the affected area as often as told by your health care provider. Use the heat source that your health care provider recommends, such as a moist heat pack or a heating pad. ? Place a towel between your skin and the heat source. ? Leave the heat on for 20-30 minutes. ? Remove the heat if your skin turns bright red. This is especially important if you are unable to feel pain, heat, or cold. You have a greater risk of getting burned. Activity   Do not stay in bed. Staying in bed for more than 1-2 days can delay your recovery.  Sit up and stand up straight. Avoid leaning forward when you sit, or hunching over when you stand. ? If you work at a desk, sit close to it so you do not need to lean over. Keep your chin tucked  in. Keep your neck drawn back, and keep your elbows bent at a right angle. Your arms should look like the letter "L." ? Sit high and close to the steering wheel when you drive. Add lower back (lumbar) support to your car seat, if needed.  Take short walks on even surfaces as soon as you are able. Try to increase the length of time you walk each day.  Do not sit, drive, or stand in one place for more than 30 minutes at a time. Sitting or standing for long periods of time can put stress on your back.  Do not drive or use heavy machinery while taking prescription pain medicine.  Use proper lifting techniques. When you bend and lift, use positions that put less stress on your back: ? Bend your knees. ? Keep the load close to your body. ? Avoid twisting.  Exercise regularly as told by your health care provider. Exercising helps your back heal faster and helps prevent back injuries by keeping muscles strong and flexible.  Work with a physical therapist to make a safe exercise program, as recommended by your health care provider. Do any exercises as told by your physical therapist. Lifestyle  Maintain a healthy weight. Extra weight puts stress on your back and makes it difficult to have good posture.  Avoid activities or situations that make you feel anxious or stressed. Stress and anxiety increase muscle   tension and can make back pain worse. Learn ways to manage anxiety and stress, such as through exercise. General instructions  Sleep on a firm mattress in a comfortable position. Try lying on your side with your knees slightly bent. If you lie on your back, put a pillow under your knees.  Follow your treatment plan as told by your health care provider. This may include: ? Cognitive or behavioral therapy. ? Acupuncture or massage therapy. ? Meditation or yoga. Contact a health care provider if:  You have pain that is not relieved with rest or medicine.  You have increasing pain going down  into your legs or buttocks.  Your pain does not improve after 2 weeks.  You have pain at night.  You lose weight without trying.  You have a fever or chills. Get help right away if:  You develop new bowel or bladder control problems.  You have unusual weakness or numbness in your arms or legs.  You develop nausea or vomiting.  You develop abdominal pain.  You feel faint. Summary  Acute back pain is sudden and usually short-lived.  Use proper lifting techniques. When you bend and lift, use positions that put less stress on your back.  Take over-the-counter and prescription medicines and apply heat or ice as directed by your health care provider. This information is not intended to replace advice given to you by your health care provider. Make sure you discuss any questions you have with your health care provider. Document Revised: 09/12/2018 Document Reviewed: 01/05/2017 Elsevier Patient Education  2020 [employer]  

## 2019-11-16 LAB — URINE CULTURE
MICRO NUMBER:: 10576180
Result:: NO GROWTH
SPECIMEN QUALITY:: ADEQUATE

## 2019-11-16 LAB — IRON,TIBC AND FERRITIN PANEL
%SAT: 23 % (calc) (ref 20–48)
Ferritin: 38 ng/mL (ref 24–380)
Iron: 66 ug/dL (ref 50–180)
TIBC: 283 mcg/dL (calc) (ref 250–425)

## 2019-11-16 LAB — HEMOGLOBIN A1C: Hgb A1c MFr Bld: 6.5 % (ref 4.6–6.5)

## 2019-11-19 DIAGNOSIS — R350 Frequency of micturition: Secondary | ICD-10-CM | POA: Insufficient documentation

## 2019-11-19 DIAGNOSIS — I4891 Unspecified atrial fibrillation: Secondary | ICD-10-CM | POA: Insufficient documentation

## 2019-11-19 NOTE — Assessment & Plan Note (Signed)
Check urinalysis and culture 

## 2019-11-19 NOTE — Assessment & Plan Note (Addendum)
RRR today and tolerating meds

## 2019-11-19 NOTE — Progress Notes (Addendum)
Subjective:    Patient ID: Harry Brown, male    DOB: 1941/05/26, 79 y.o.   MRN: [id number]  Chief Complaint  Patient presents with  . Back Pain    lower back    HPI Patient is in today for follow up on chronic medical concerns. No recent febrile illness or hospitalizations. No acute concerns. Is noting back pain and fatigue as well as left hip pain. No recent fall or trauma. No incontinence. Denies CP/palp/SOB/HA/congestion/fevers/GI or GU c/o. Taking meds as prescribed  Past Medical History:  Diagnosis Date  . Anemia 09/24/2013  . CAD (coronary artery disease)   . Cellulitis 05/14/2013   RLE  . Diverticulitis   . ED (erectile dysfunction)   . Glaucoma 08/18/2014  . History of sleep apnea    had surgery to correct  . Hypertension   . Hypothyroidism   . Medicare annual wellness visit, subsequent 04/13/2013  . Myocardial infarction ([place]) 11/05/1985  . Nocturia 03/15/2017  . Other and unspecified hyperlipidemia   . Penile lesion 02/24/2015  . Peripheral neuropathy 03/31/2012  . Peripheral vascular disease ([place])   . Postoperative anemia due to acute blood loss 09/24/2013  . Preventative health care 03/13/2016  . Sleep apnea 09/16/2017  . Type II diabetes mellitus (HCC)    fasting 100-120  . Urinary urgency 11/22/2012    Past Surgical History:  Procedure Laterality Date  . ABDOMINAL AORTAGRAM  Oct. 9, 2014   Dr. Bridgett Larsson  . ABDOMINAL AORTAGRAM N/A 03/15/2013   Procedure: ABDOMINAL Maxcine Ham;  Surgeon: Conrad Raytown, MD;  Location: [facility] CATH LAB;  Service: Cardiovascular;  Laterality: N/A;  . ANTERIOR CERVICAL DECOMP/DISCECTOMY FUSION  ~ 2000  . CATARACT EXTRACTION Right   . CORONARY ARTERY BYPASS GRAFT  2001   "CABG X3" (05/14/2013)  . DENTAL SURGERY     "multiple teeth removed; trmimed top of mouth so dentures would fit" (05/14/2013)  . EYE SURGERY     cataracts  . FEMORAL-POPLITEAL BYPASS GRAFT Right 04/25/2013   Procedure: RIGHT FEMORAL TO ABOVE KNEE POPLITEAL BYPASS GRAFT WITH  RIGHT GREATER SAPHENOUS VEIN HARVEST; ULTRASOUND GUIDED;  Surgeon: Conrad Corsica, MD;  Location: [facility] OR;  Service: Vascular;  Laterality: Right;  . GUM SURGERY  [phone]   "had bone scraped; had periodontal disease" (05/14/2013)  . PALATE / UVULA BIOPSY / EXCISION  2003   Removed due to sleep apnea  . TONSILLECTOMY      Family History  Problem Relation Age of Onset  . Hyperlipidemia Mother   . Heart disease Mother   . Diabetes Mother   . Hypertension Mother   . Heart disease Father   . Asthma Father   . Arthritis Father   . Heart disease Sister   . Heart disease Sister        s/p bypass x 2  . Lupus Sister   . Heart disease Brother        MI waiting on heart transplant when died  . Heart disease Brother        massive MI  . Cancer Neg Hx     Social History   Socioeconomic History  . Marital status: Married    Spouse name: Not on file  . Number of children: 8   . Years of education: Not on file  . Highest education level: Not on file  Occupational History    Employer: [employer]  Tobacco Use  . Smoking status: Former Smoker    Packs/day: 1.50  Years: 50.00    Pack years: 75.00    Types: Cigarettes  . Smokeless tobacco: Never Used  . Tobacco comment: 05/14/2013 "quit smoking in ~ 2004; still Uses Comit lozenges"  Substance and Sexual Activity  . Alcohol use: No    Alcohol/week: 0.0 standard drinks  . Drug use: No  . Sexual activity: Yes    Comment: lives with wife, retiring end of next week, no dietary restrictions  Other Topics Concern  . 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 Difficulty of Paying Living Expenses:   Food Insecurity:   . Worried About Charity fundraiser in the Last Year:   . Arboriculturist in the Last Year:   Transportation Needs:   . Film/video editor (Medical):   Marland Kitchen Lack of  Transportation (Non-Medical):   Physical Activity:   . Days of Exercise per Week:   . Minutes of Exercise per Session:   Stress:   . Feeling of Stress :   Social Connections:   . Frequency of Communication with Friends and Family:   . Frequency of Social Gatherings with Friends and Family:   . Attends Religious Services:   . Active Member of Clubs or Organizations:   . Attends Archivist Meetings:   Marland Kitchen Marital Status:   Intimate Partner Violence:   . Fear of Current or Ex-Partner:   . Emotionally Abused:   Marland Kitchen Physically Abused:   . Sexually Abused:     Outpatient Medications Prior to Visit  Medication Sig Dispense Refill  . atorvastatin (LIPITOR) 40 MG tablet TAKE ONE (1) TABLET BY MOUTH EACH DAY AT6PM (Patient taking differently: Take 40 mg by mouth daily. ) 30 tablet 11  . cholecalciferol (VITAMIN D) 1000 units tablet Take 1,000 Units by mouth daily.    Marland Kitchen latanoprost (XALATAN) 0.005 % ophthalmic solution Place 1 drop into both eyes at bedtime.     Marland Kitchen levothyroxine (SYNTHROID) 75 MCG tablet TAKE ONE (1) TABLET BY MOUTH EVERY DAY (Patient taking differently: Take 75 mcg by mouth daily before breakfast. ) 90 tablet 0  . loratadine (ALAVERT) 10 MG tablet Take 10 mg by mouth daily as needed for allergies.    Marland Kitchen losartan (COZAAR) 25 MG tablet Take 1 tablet (25 mg total) by mouth daily. 30 tablet 3  . ONETOUCH DELICA LANCETS 81W MISC USE TO CHECK BLOOD SUGAR ONCE DAILY 100 each 1  . ONETOUCH ULTRA test strip USE 1 STRIP DAILY 100 each 0  . tamsulosin (FLOMAX) 0.4 MG CAPS capsule TAKE ONE CAPSULE BY MOUTH DAILY (Patient taking differently: Take 0.4 mg by mouth daily. ) 30 capsule 2  . apixaban (ELIQUIS) 5 MG TABS tablet Take 1 tablet (5 mg total) by mouth 2 (two) times daily. 60 tablet 0  . atenolol (TENORMIN) 25 MG tablet TAKE ONE (1) TABLET BY MOUTH EVERY DAY (Patient taking differently: Take 25 mg by mouth daily. ) 30 tablet 3  . finasteride (PROSCAR) 5 MG tablet TAKE ONE (1)  TABLET BY MOUTH EVERY DAY (Patient not taking: Reported on 11/23/2019) 30 tablet 2  . lidocaine (LIDODERM) 5 % Place 1 patch onto the skin daily. Remove & Discard patch within 12 hours or as directed by MD 15 patch 0  . metFORMIN (GLUCOPHAGE-XR) 500 MG 24 hr tablet TAKE TWO (2) TABLETS BY  MOUTH EVERY MORNING WITH BREAKFAST (Patient taking differently: Take 1,000 mg by mouth daily with breakfast. ) 60 tablet 0  . Multiple Vitamin (MULTIVITAMIN WITH MINERALS) TABS tablet Take 1 tablet by mouth daily.    . naproxen sodium (ALEVE) 220 MG tablet Take 220 mg by mouth as needed.    . nitroGLYCERIN (NITROSTAT) 0.4 MG SL tablet Place 1 tablet (0.4 mg total) under the tongue every 5 (five) minutes as needed for chest pain. 25 tablet 11   No facility-administered medications prior to visit.    Allergies  Allergen Reactions  . Adhesive [Tape] Rash  . Latex Rash  . Other Hives, Swelling and Rash    Plastic shopping bags Metal staples: rash, swelling    Review of Systems  Constitutional: Positive for malaise/fatigue. Negative for fever.  HENT: Negative for congestion.   Eyes: Negative for blurred vision.  Respiratory: Negative for shortness of breath.   Cardiovascular: Negative for chest pain, palpitations and leg swelling.  Gastrointestinal: Negative for abdominal pain, blood in stool and nausea.  Genitourinary: Negative for dysuria and frequency.  Musculoskeletal: Positive for back pain and joint pain. Negative for falls.  Skin: Negative for rash.  Neurological: Negative for dizziness, loss of consciousness and headaches.  Endo/Heme/Allergies: Negative for environmental allergies.  Psychiatric/Behavioral: Negative for depression. The patient is not nervous/anxious.        Objective:    Physical Exam Vitals and nursing note reviewed.  Constitutional:      General: He is not in acute distress.    Appearance: He is well-developed.  HENT:     Head: Normocephalic and atraumatic.     Nose:  Nose normal.  Eyes:     General:        Right eye: No discharge.        Left eye: No discharge.  Cardiovascular:     Rate and Rhythm: Normal rate and regular rhythm.     Heart sounds: No murmur heard.   Pulmonary:     Effort: Pulmonary effort is normal.     Breath sounds: Normal breath sounds.  Abdominal:     General: Bowel sounds are normal.     Palpations: Abdomen is soft.     Tenderness: There is no abdominal tenderness.  Musculoskeletal:     Cervical back: Normal range of motion and neck supple.  Skin:    General: Skin is warm and dry.  Neurological:     Mental Status: He is alert and oriented to person, place, and time.     BP (!) 121/52 (BP Location: Right Arm, Cuff Size: Large)   Pulse 72   Temp 98.5 F (36.9 C) (Temporal)   Resp 12   Ht 6' (1.829 m)   Wt 197 lb 12.8 oz (89.7 kg)   SpO2 99%   BMI 26.83 kg/m  Wt Readings from Last 3 Encounters:  12/03/19 197 lb (89.4 kg)  11/28/19 197 lb (89.4 kg)  11/22/19 199 lb (90.3 kg)    Diabetic Foot Exam - Simple   No data filed     Lab Results  Component Value Date   WBC 9.1 11/22/2019   HGB 12.6 (L) 11/22/2019   HCT 39.6 11/22/2019   PLT 239 11/22/2019   GLUCOSE 104 (H) 11/22/2019   CHOL 116 11/15/2019   TRIG 46.0 11/15/2019   HDL 40.60 11/15/2019   LDLCALC 67 11/15/2019   ALT 15 11/15/2019   AST 15 11/15/2019   NA 137 11/22/2019   K 4.6  11/22/2019   CL 105 11/22/2019   CREATININE 1.04 11/22/2019   BUN 23 11/22/2019   CO2 23 11/22/2019   TSH 5.31 (H) 11/15/2019   PSA 1.47 05/02/2018   INR 0.99 04/20/2013   HGBA1C 6.5 11/15/2019   MICROALBUR 0.7 03/11/2016    Lab Results  Component Value Date   TSH 5.31 (H) 11/15/2019   Lab Results  Component Value Date   WBC 9.1 11/22/2019   HGB 12.6 (L) 11/22/2019   HCT 39.6 11/22/2019   MCV 95.7 11/22/2019   PLT 239 11/22/2019   Lab Results  Component Value Date   NA 137 11/22/2019   K 4.6 11/22/2019   CO2 23 11/22/2019   GLUCOSE 104 (H)  11/22/2019   BUN 23 11/22/2019   CREATININE 1.04 11/22/2019   BILITOT 0.6 11/15/2019   ALKPHOS 63 11/15/2019   AST 15 11/15/2019   ALT 15 11/15/2019   PROT 6.5 11/15/2019   ALBUMIN 4.0 11/15/2019   CALCIUM 9.1 11/22/2019   ANIONGAP 9 11/22/2019   GFR 69.49 11/15/2019   Lab Results  Component Value Date   CHOL 116 11/15/2019   Lab Results  Component Value Date   HDL 40.60 11/15/2019   Lab Results  Component Value Date   LDLCALC 67 11/15/2019   Lab Results  Component Value Date   TRIG 46.0 11/15/2019   Lab Results  Component Value Date   CHOLHDL 3 11/15/2019   Lab Results  Component Value Date   HGBA1C 6.5 11/15/2019       Assessment & Plan:   Problem List Items Addressed This Visit    Hypothyroidism    On Levothyroxine, continue to monitor      Relevant Orders   TSH (Completed)   Hyperlipidemia    Encouraged heart healthy diet, increase exercise, avoid trans fats, consider a krill oil cap daily      Relevant Medications   apixaban (ELIQUIS) 5 MG TABS tablet   Other Relevant Orders   Lipid panel (Completed)   Anemia    Increase leafy greens, consider increased lean red meat and using cast iron cookware. Continue to monitor, report any concerns      Relevant Orders   Iron, TIBC and Ferritin Panel (Completed)   Hypertension    Well controlled, no changes to meds. Encouraged heart healthy diet such as the DASH diet and exercise as tolerated.       Relevant Medications   apixaban (ELIQUIS) 5 MG TABS tablet   Other Relevant Orders   CBC with Differential/Platelet (Completed)   Comprehensive metabolic panel (Completed)   DM (diabetes mellitus) with complications (HCC)    [id number] acceptable, minimize simple carbs. Increase exercise as tolerated. Continue current meds      Urinary frequency - Primary    Check urinalysis and culture.       Relevant Orders   Urine Culture (Completed)   Urinalysis (Completed)   Atrial fibrillation (HCC)    RRR  today and tolerating meds      Relevant Medications   apixaban (ELIQUIS) 5 MG TABS tablet      I am having Harry Brown "Tim" maintain his loratadine, latanoprost, cholecalciferol, OneTouch Delica Lancets 29H, nitroGLYCERIN, OneTouch Ultra, atorvastatin, tamsulosin, levothyroxine, losartan, and apixaban.  Meds ordered this encounter  Medications  . apixaban (ELIQUIS) 5 MG TABS tablet    Sig: Take 1 tablet (5 mg total) by mouth 2 (two) times daily.    Dispense:  60 tablet    Refill:  0     Penni Homans, MD

## 2019-11-19 NOTE — Assessment & Plan Note (Signed)
Increase leafy greens, consider increased lean red meat and using cast iron cookware. Continue to monitor, report any concerns 

## 2019-11-19 NOTE — Assessment & Plan Note (Signed)
Well controlled, no changes to meds. Encouraged heart healthy diet such as the DASH diet and exercise as tolerated.  °

## 2019-11-19 NOTE — Assessment & Plan Note (Signed)
hgba1c acceptable, minimize simple carbs. Increase exercise as tolerated. Continue current meds 

## 2019-11-19 NOTE — Assessment & Plan Note (Signed)
On Levothyroxine, continue to monitor 

## 2019-11-19 NOTE — Assessment & Plan Note (Signed)
Encouraged heart healthy diet, increase exercise, avoid trans fats, consider a krill oil cap daily 

## 2019-11-22 ENCOUNTER — Encounter (HOSPITAL_COMMUNITY): Payer: Self-pay | Admitting: Nurse Practitioner

## 2019-11-22 ENCOUNTER — Ambulatory Visit (HOSPITAL_COMMUNITY)
Admission: RE | Admit: 2019-11-22 | Discharge: 2019-11-22 | Disposition: A | Discharge status: Home / Self Care | Payer: Medicare Other | Source: Ambulatory Visit | Attending: Nurse Practitioner | Admitting: Nurse Practitioner

## 2019-11-22 ENCOUNTER — Other Ambulatory Visit: Payer: Self-pay

## 2019-11-22 VITALS — BP 148/66 | HR 81 | Ht 72.0 in | Wt 199.0 lb

## 2019-11-22 DIAGNOSIS — I4819 Other persistent atrial fibrillation: Secondary | ICD-10-CM | POA: Insufficient documentation

## 2019-11-22 DIAGNOSIS — Z8349 Family history of other endocrine, nutritional and metabolic diseases: Secondary | ICD-10-CM | POA: Diagnosis not present

## 2019-11-22 DIAGNOSIS — Z833 Family history of diabetes mellitus: Secondary | ICD-10-CM | POA: Diagnosis not present

## 2019-11-22 DIAGNOSIS — E1151 Type 2 diabetes mellitus with diabetic peripheral angiopathy without gangrene: Secondary | ICD-10-CM | POA: Insufficient documentation

## 2019-11-22 DIAGNOSIS — Z825 Family history of asthma and other chronic lower respiratory diseases: Secondary | ICD-10-CM | POA: Diagnosis not present

## 2019-11-22 DIAGNOSIS — E039 Hypothyroidism, unspecified: Secondary | ICD-10-CM | POA: Diagnosis not present

## 2019-11-22 DIAGNOSIS — E1142 Type 2 diabetes mellitus with diabetic polyneuropathy: Secondary | ICD-10-CM | POA: Diagnosis not present

## 2019-11-22 DIAGNOSIS — Z79899 Other long term (current) drug therapy: Secondary | ICD-10-CM | POA: Insufficient documentation

## 2019-11-22 DIAGNOSIS — I252 Old myocardial infarction: Secondary | ICD-10-CM | POA: Diagnosis not present

## 2019-11-22 DIAGNOSIS — Z7984 Long term (current) use of oral hypoglycemic drugs: Secondary | ICD-10-CM | POA: Diagnosis not present

## 2019-11-22 DIAGNOSIS — Z8261 Family history of arthritis: Secondary | ICD-10-CM | POA: Diagnosis not present

## 2019-11-22 DIAGNOSIS — I2510 Atherosclerotic heart disease of native coronary artery without angina pectoris: Secondary | ICD-10-CM | POA: Insufficient documentation

## 2019-11-22 DIAGNOSIS — I4891 Unspecified atrial fibrillation: Secondary | ICD-10-CM | POA: Diagnosis not present

## 2019-11-22 DIAGNOSIS — Z79890 Hormone replacement therapy (postmenopausal): Secondary | ICD-10-CM | POA: Insufficient documentation

## 2019-11-22 DIAGNOSIS — Z87891 Personal history of nicotine dependence: Secondary | ICD-10-CM | POA: Insufficient documentation

## 2019-11-22 DIAGNOSIS — D6869 Other thrombophilia: Secondary | ICD-10-CM

## 2019-11-22 DIAGNOSIS — Z91040 Latex allergy status: Secondary | ICD-10-CM | POA: Insufficient documentation

## 2019-11-22 DIAGNOSIS — Z8249 Family history of ischemic heart disease and other diseases of the circulatory system: Secondary | ICD-10-CM | POA: Insufficient documentation

## 2019-11-22 DIAGNOSIS — Z7901 Long term (current) use of anticoagulants: Secondary | ICD-10-CM | POA: Diagnosis not present

## 2019-11-22 DIAGNOSIS — H409 Unspecified glaucoma: Secondary | ICD-10-CM | POA: Diagnosis not present

## 2019-11-22 DIAGNOSIS — I10 Essential (primary) hypertension: Secondary | ICD-10-CM | POA: Diagnosis not present

## 2019-11-22 LAB — BASIC METABOLIC PANEL
Anion gap: 9 (ref 5–15)
BUN: 23 mg/dL (ref 8–23)
CO2: 23 mmol/L (ref 22–32)
Calcium: 9.1 mg/dL (ref 8.9–10.3)
Chloride: 105 mmol/L (ref 98–111)
Creatinine, Ser: 1.04 mg/dL (ref 0.61–1.24)
GFR calc Af Amer: >60 mL/min (ref >60)
GFR calc non Af Amer: >60 mL/min (ref >60)
Glucose, Bld: 104 mg/dL — ABNORMAL HIGH (ref 70–99)
Potassium: 4.6 mmol/L (ref 3.5–5.1)
Sodium: 137 mmol/L (ref 135–145)

## 2019-11-22 LAB — CBC
HCT: 39.6 % (ref 39.0–52.0)
Hemoglobin: 12.6 g/dL — ABNORMAL LOW (ref 13.0–17.0)
MCH: 30.4 pg (ref 26.0–34.0)
MCHC: 31.8 g/dL (ref 30.0–36.0)
MCV: 95.7 fL (ref 80.0–100.0)
Platelets: 239 10*3/uL (ref 150–400)
RBC: 4.14 MIL/uL — ABNORMAL LOW (ref 4.22–5.81)
RDW: 13.8 % (ref 11.5–15.5)
WBC: 9.1 10*3/uL (ref 4.0–10.5)
nRBC: 0 % (ref 0.0–0.2)

## 2019-11-22 NOTE — Progress Notes (Signed)
Primary Care Physician: [name], MD Referring Physician: [facility] f/u Cardiologist: Dr. Levy Pupa Harry Brown is a 79 y.o. male with a h/o HTN, AAA, hypothyroidism, CAD, DM2, PVD, that was recently seen in the ER for sharp posterior  chest pain worse with movement or deep breathing. He was incidentally found to be in asymptomatic new onset afib. Back pain was thought to be musculoskeletal in origin. He was started on an Eliquis  5 mg bid starter pack. Since he was prescribed a starter pack(for DVT/PE pt's,  NOT afib, he has received double amount DOAC than needed. He will drop back to 5 mg bid starting tonight. He is not really symptomatic with afib. He is rate controlled but his BP is elevated today. Will probably need a cardioversion after 21 days of anticoagulation.  F/u in afib clinic, 6/17. He remains in rate controlled asymptomatic afib . He has not  missed any anticoagulation since starting  6/6. He is currently on the correct dose of 5 mg eliquis  Bid. His BP is much improved since starting losartan 25 mg daily. The wife reports that the pt had a few minutes of garbled speech a week ago. He refused to go to the ER. He felt that he got overheated/dehydrated as he had been working in the heat. He has  not had any further symptoms.    Today, he denies symptoms of palpitations, chest pain, shortness of breath, orthopnea, PND, lower extremity edema, dizziness, presyncope, syncope, or neurologic sequela. The patient is tolerating medications without difficulties and is otherwise without complaint today.   Past Medical History:  Diagnosis Date   Anemia 09/24/2013   CAD (coronary artery disease)    Cellulitis 05/14/2013   RLE   Diverticulitis    ED (erectile dysfunction)    Glaucoma 08/18/2014   History of sleep apnea    had surgery to correct   Hypertension    Hypothyroidism    Medicare annual wellness visit, subsequent 04/13/2013   Myocardial infarction ([place]) 11/05/1985    Nocturia 03/15/2017   Other and unspecified hyperlipidemia    Penile lesion 02/24/2015   Peripheral neuropathy 03/31/2012   Peripheral vascular disease ([place])    Postoperative anemia due to acute blood loss 09/24/2013   Preventative health care 03/13/2016   Sleep apnea 09/16/2017   Type II diabetes mellitus ([place])    fasting 100-120   Urinary urgency 11/22/2012   Past Surgical History:  Procedure Laterality Date   ABDOMINAL AORTAGRAM  Oct. 9, 2014   Dr. Bridgett Larsson   ABDOMINAL Maxcine Ham N/A 03/15/2013   Procedure: ABDOMINAL Maxcine Ham;  Surgeon: Conrad Aneth, MD;  Location: [facility] CATH LAB;  Service: Cardiovascular;  Laterality: N/A;   ANTERIOR CERVICAL DECOMP/DISCECTOMY FUSION  ~ 2000   CATARACT EXTRACTION Right    CORONARY ARTERY BYPASS GRAFT  2001   "CABG X3" (05/14/2013)   DENTAL SURGERY     "multiple teeth removed; trmimed top of mouth so dentures would fit" (05/14/2013)   EYE SURGERY     cataracts   FEMORAL-POPLITEAL BYPASS GRAFT Right 04/25/2013   Procedure: RIGHT FEMORAL TO ABOVE KNEE POPLITEAL BYPASS GRAFT WITH RIGHT GREATER SAPHENOUS VEIN HARVEST; ULTRASOUND GUIDED;  Surgeon: Conrad Corwin, MD;  Location: [facility] OR;  Service: Vascular;  Laterality: Right;   GUM SURGERY  [phone]   "had bone scraped; had periodontal disease" (05/14/2013)   PALATE / UVULA BIOPSY / EXCISION  2003   Removed due to sleep apnea   TONSILLECTOMY  Current Outpatient Medications  Medication Sig Dispense Refill   apixaban (ELIQUIS) 5 MG TABS tablet Take 1 tablet (5 mg total) by mouth 2 (two) times daily. 60 tablet 0   atenolol (TENORMIN) 25 MG tablet TAKE ONE (1) TABLET BY MOUTH EVERY DAY 30 tablet 3   atorvastatin (LIPITOR) 40 MG tablet TAKE ONE (1) TABLET BY MOUTH EACH DAY AT6PM 30 tablet 11   cholecalciferol (VITAMIN D) 1000 units tablet Take 1,000 Units by mouth daily.     finasteride (PROSCAR) 5 MG tablet TAKE ONE (1) TABLET BY MOUTH EVERY DAY 30 tablet 2   latanoprost (XALATAN) 0.005  % ophthalmic solution Place 1 drop into both eyes at bedtime.      levothyroxine (SYNTHROID) 75 MCG tablet TAKE ONE (1) TABLET BY MOUTH EVERY DAY 90 tablet 0   loratadine (ALAVERT) 10 MG tablet Take 10 mg by mouth daily as needed for allergies.     losartan (COZAAR) 25 MG tablet Take 1 tablet (25 mg total) by mouth daily. 30 tablet 3   metFORMIN (GLUCOPHAGE-XR) 500 MG 24 hr tablet TAKE TWO (2) TABLETS BY MOUTH EVERY MORNING WITH BREAKFAST 60 tablet 0   Multiple Vitamin (MULTIVITAMIN WITH MINERALS) TABS tablet Take 1 tablet by mouth daily.     nitroGLYCERIN (NITROSTAT) 0.4 MG SL tablet Place 1 tablet (0.4 mg total) under the tongue every 5 (five) minutes as needed for chest pain. 25 tablet 11   ONETOUCH DELICA LANCETS 56P MISC USE TO CHECK BLOOD SUGAR ONCE DAILY 100 each 1   ONETOUCH ULTRA test strip USE 1 STRIP DAILY 100 each 0   tamsulosin (FLOMAX) 0.4 MG CAPS capsule TAKE ONE CAPSULE BY MOUTH DAILY 30 capsule 2   No current facility-administered medications for this encounter.    Allergies  Allergen Reactions   Adhesive [Tape]    Latex     rash   Other Hives    Plastic shopping bags Metal staples: rash, swelling    Social History   Socioeconomic History   Marital status: Married    Spouse name: Not on file   Number of children: 8    Years of education: Not on file   Highest education level: Not on file  Occupational History    Employer: [employer]  Tobacco Use   Smoking status: Former Smoker    Packs/day: 1.50    Years: 50.00    Pack years: 75.00    Types: Cigarettes   Smokeless tobacco: Never Used   Tobacco comment: 05/14/2013 "quit smoking in ~ 2004; still Uses Comit lozenges"  Substance and Sexual Activity   Alcohol use: No    Alcohol/week: 0.0 standard drinks   Drug use: No   Sexual activity: Yes    Comment: lives with wife, retiring end of next week, no dietary restrictions  Other Topics Concern   Not on file  Social History  Narrative   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, 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 [employer]   Social Determinants of Radio broadcast assistant Strain:    Difficulty of Paying Living Expenses:   Food Insecurity:    Worried About Charity fundraiser in the Last Year:    Arboriculturist in the Last Year:   Transportation Needs:    Film/video editor (Medical):    Lack of Transportation (Non-Medical):   Physical Activity:    Days of Exercise per Week:    Minutes of Exercise per Session:   Stress:  Feeling of Stress :   Social Connections:    Frequency of Communication with Friends and Family:    Frequency of Social Gatherings with Friends and Family:    Attends Religious Services:    Active Member of Clubs or Organizations:    Attends Music therapist:    Marital Status:   Intimate Partner Violence:    Fear of Current or Ex-Partner:    Emotionally Abused:    Physically Abused:    Sexually Abused:     Family History  Problem Relation Age of Onset   Hyperlipidemia Mother    Heart disease Mother    Diabetes Mother    Hypertension Mother    Heart disease Father    Asthma Father    Arthritis Father    Heart disease Sister    Heart disease Sister        s/p bypass x 2   Lupus Sister    Heart disease Brother        MI waiting on heart transplant when died   Heart disease Brother        massive MI   Cancer Neg Hx     ROS- All systems are reviewed and negative except as per the HPI above  Physical Exam: Vitals:   11/22/19 1515  BP: (!) 148/66  Pulse: 81  Weight: 90.3 kg  Height: 6' (1.829 m)   Wt Readings from Last 3 Encounters:  11/22/19 90.3 kg  11/15/19 89.7 kg  11/13/19 90.8 kg    Labs: Lab Results  Component Value Date   NA 138 11/15/2019   K 4.6 11/15/2019   CL 105 11/15/2019   CO2 30 11/15/2019   GLUCOSE 112 (H) 11/15/2019   BUN 25 (H) 11/15/2019   CREATININE 1.03 11/15/2019   CALCIUM 9.5 11/15/2019    PHOS 3.7 07/24/2014   Lab Results  Component Value Date   INR 0.99 04/20/2013   Lab Results  Component Value Date   CHOL 116 11/15/2019   HDL 40.60 11/15/2019   LDLCALC 67 11/15/2019   TRIG 46.0 11/15/2019     GEN- The patient is well appearing, alert and oriented x 3 today.   Head- normocephalic, atraumatic Eyes-  Sclera clear, conjunctiva pink Ears- hearing intact Oropharynx- clear Neck- supple, no JVP Lymph- no cervical lymphadenopathy Lungs- Clear to ausculation bilaterally, normal work of breathing Heart- irregular rate and rhythm, no murmurs, rubs or gallops, PMI not laterally displaced GI- soft, NT, ND, + BS Extremities- no clubbing, cyanosis, or edema MS- no significant deformity or atrophy Skin- no rash or lesion Psych- euthymic mood, full affect Neuro- strength and sensation are intact  EKG-afib at 81 bpm with PVC's   Echo-08/10/18-1. The left ventricle has normal systolic function with an ejection  fraction of 60-65%. The cavity size was normal. Left ventricular diastolic  Doppler parameters are consistent with pseudonormalization.  2. The right ventricle has normal systolic function. The cavity was  normal. There is no increase in right ventricular wall thickness.  3. The mitral valve is normal in structure. There is mild mitral annular  calcification present.  4. The tricuspid valve is normal in structure.  5. The aortic valve is normal in structure. Mild sclerosis of the aortic  valve.  6. The pulmonic valve was normal in structure.  7. There is moderate dilatation of the ascending aorta measuring 42 mm.    Assessment and Plan: 1.New onset persistent afib Unknown duration Asymptomatic and rate controlled Continue  atenolol 25 mg a day  2. CHA2DS2VASc score of at least 6  Continue eliquis 5 mg bid  He will be on anticoagulation x 3 weeks as of 6/27 and scheduled 6/28 for DCCV Reminded again no to miss any doses of anticoagulation  Bleeding   precautions discussed  bmet/cbc  3.HTN Improved since start of  losartan 25 mg   I will see back one week after cardioversion  F/u with Dr. Stanford Breed 6/23  Geroge Baseman. Malaiyah Achorn, [facility] [address] [place], [place] [phone]

## 2019-11-22 NOTE — Patient Instructions (Signed)
Cardioversion scheduled for Monday, June 28th  - Arrive at the [facility] and go to admitting at 10:30AM  - Do not eat or drink anything after midnight the night prior to your procedure.  - Take all your morning medication (except diabetic medications) with a sip of water prior to arrival.  - You will not be able to drive home after your procedure.  - Do NOT miss any doses of your blood thinner - if you should miss a dose please notify our office immediately.

## 2019-11-24 NOTE — Progress Notes (Signed)
HPI: FUAtrial fibrillation, AAA andcoronary artery disease. Hadcoronary artery bypassing graft in July of 2001 with a LIMA to the LAD, RIMA to the circumflex and saphenous vein graft to the PDA. Patient has known peripheral vascular disease followed by vascular surgery.Nuclear study March 2020 showed no ischemia. Echocardiogram March 2020 showed normal LV function, grade 2 diastolic dysfunction, mildly dilated aortic root at 42 mm.  Monitor October 2020 showed sinus with occasional PACs and PVCs.  Abdominal ultrasound 12/20 showed 3.1 cm AAA. ABIs 3/21 showed moderate decrease on the left. Chest CT 5/21 showed 4 cm thoracic aortic aneurysm. Recently noted to be in atrial fibrillation; seen in afib clinic and DCCV has been scheduled. Since last seen  patient denies dyspnea, chest pain, palpitations or syncope.  No bleeding.  Current Outpatient Medications  Medication Sig Dispense Refill  . apixaban (ELIQUIS) 5 MG TABS tablet Take 1 tablet (5 mg total) by mouth 2 (two) times daily. 60 tablet 0  . atenolol (TENORMIN) 25 MG tablet TAKE ONE (1) TABLET BY MOUTH EVERY DAY 90 tablet 1  . atorvastatin (LIPITOR) 40 MG tablet TAKE ONE (1) TABLET BY MOUTH EACH DAY AT6PM (Patient taking differently: Take 40 mg by mouth daily. ) 30 tablet 11  . cholecalciferol (VITAMIN D) 1000 units tablet Take 1,000 Units by mouth daily.    Marland Kitchen latanoprost (XALATAN) 0.005 % ophthalmic solution Place 1 drop into both eyes at bedtime.     Marland Kitchen levothyroxine (SYNTHROID) 75 MCG tablet TAKE ONE (1) TABLET BY MOUTH EVERY DAY (Patient taking differently: Take 75 mcg by mouth daily before breakfast. ) 90 tablet 0  . loratadine (ALAVERT) 10 MG tablet Take 10 mg by mouth daily as needed for allergies.    Marland Kitchen losartan (COZAAR) 25 MG tablet Take 1 tablet (25 mg total) by mouth daily. 30 tablet 3  . metFORMIN (GLUCOPHAGE-XR) 500 MG 24 hr tablet TAKE TWO (2) TABLETS BY MOUTH EVERY MORNING WITH BREAKFAST 180 tablet 1  . nitroGLYCERIN  (NITROSTAT) 0.4 MG SL tablet Place 1 tablet (0.4 mg total) under the tongue every 5 (five) minutes as needed for chest pain. 25 tablet 11  . ONETOUCH DELICA LANCETS 00P MISC USE TO CHECK BLOOD SUGAR ONCE DAILY 100 each 1  . ONETOUCH ULTRA test strip USE 1 STRIP DAILY 100 each 0  . tamsulosin (FLOMAX) 0.4 MG CAPS capsule TAKE ONE CAPSULE BY MOUTH DAILY (Patient taking differently: Take 0.4 mg by mouth daily. ) 30 capsule 2  . finasteride (PROSCAR) 5 MG tablet TAKE ONE (1) TABLET BY MOUTH EVERY DAY (Patient not taking: Reported on 11/23/2019) 30 tablet 2   No current facility-administered medications for this visit.     Past Medical History:  Diagnosis Date  . Anemia 09/24/2013  . CAD (coronary artery disease)   . Cellulitis 05/14/2013   RLE  . Diverticulitis   . ED (erectile dysfunction)   . Glaucoma 08/18/2014  . History of sleep apnea    had surgery to correct  . Hypertension   . Hypothyroidism   . Medicare annual wellness visit, subsequent 04/13/2013  . Myocardial infarction ([place]) 11/05/1985  . Nocturia 03/15/2017  . Other and unspecified hyperlipidemia   . Penile lesion 02/24/2015  . Peripheral neuropathy 03/31/2012  . Peripheral vascular disease ([place])   . Postoperative anemia due to acute blood loss 09/24/2013  . Preventative health care 03/13/2016  . Sleep apnea 09/16/2017  . Type II diabetes mellitus (HCC)    fasting 100-120  .  Urinary urgency 11/22/2012    Past Surgical History:  Procedure Laterality Date  . ABDOMINAL AORTAGRAM  Oct. 9, 2014   Dr. Bridgett Larsson  . ABDOMINAL AORTAGRAM N/A 03/15/2013   Procedure: ABDOMINAL Maxcine Ham;  Surgeon: Conrad Woburn, MD;  Location: [facility] CATH LAB;  Service: Cardiovascular;  Laterality: N/A;  . ANTERIOR CERVICAL DECOMP/DISCECTOMY FUSION  ~ 2000  . CATARACT EXTRACTION Right   . CORONARY ARTERY BYPASS GRAFT  2001   "CABG X3" (05/14/2013)  . DENTAL SURGERY     "multiple teeth removed; trmimed top of mouth so dentures would fit" (05/14/2013)  . EYE  SURGERY     cataracts  . FEMORAL-POPLITEAL BYPASS GRAFT Right 04/25/2013   Procedure: RIGHT FEMORAL TO ABOVE KNEE POPLITEAL BYPASS GRAFT WITH RIGHT GREATER SAPHENOUS VEIN HARVEST; ULTRASOUND GUIDED;  Surgeon: Conrad , MD;  Location: [facility] OR;  Service: Vascular;  Laterality: Right;  . GUM SURGERY  [phone]   "had bone scraped; had periodontal disease" (05/14/2013)  . PALATE / UVULA BIOPSY / EXCISION  2003   Removed due to sleep apnea  . TONSILLECTOMY      Social History   Socioeconomic History  . Marital status: Married    Spouse name: Not on file  . Number of children: 8   . Years of education: Not on file  . Highest education level: Not on file  Occupational History    Employer: [employer]  Tobacco Use  . Smoking status: Former Smoker    Packs/day: 1.50    Years: 50.00    Pack years: 75.00    Types: Cigarettes  . Smokeless tobacco: Never Used  . Tobacco comment: 05/14/2013 "quit smoking in ~ 2004; still Uses Comit lozenges"  Substance and Sexual Activity  . Alcohol use: No    Alcohol/week: 0.0 standard drinks  . Drug use: No  . Sexual activity: Yes    Comment: lives with wife, retiring end of next week, no dietary restrictions  Other Topics Concern  . 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 Difficulty of Paying Living Expenses:   Food Insecurity:   . Worried About Charity fundraiser in the Last Year:   . Arboriculturist in the Last Year:   Transportation Needs:   . Film/video editor (Medical):   Marland Kitchen Lack of Transportation (Non-Medical):   Physical Activity:   . Days of Exercise per Week:   . Minutes of Exercise per Session:   Stress:   . Feeling of Stress :   Social Connections:   . Frequency of Communication with Friends and Family:   . Frequency of Social Gatherings with Friends and Family:   . Attends  Religious Services:   . Active Member of Clubs or Organizations:   . Attends Archivist Meetings:   Marland Kitchen Marital Status:   Intimate Partner Violence:   . Fear of Current or Ex-Partner:   . Emotionally Abused:   Marland Kitchen Physically Abused:   . Sexually Abused:     Family History  Problem Relation Age of Onset  . Hyperlipidemia Mother   . Heart disease Mother   . Diabetes Mother   . Hypertension Mother   . Heart disease Father   . Asthma Father   . Arthritis Father   . Heart disease Sister   . Heart disease  Sister        s/p bypass x 2  . Lupus Sister   . Heart disease Brother        MI waiting on heart transplant when died  . Heart disease Brother        massive MI  . Cancer Neg Hx     ROS: no fevers or chills, productive cough, hemoptysis, dysphasia, odynophagia, melena, hematochezia, dysuria, hematuria, rash, seizure activity, orthopnea, PND, pedal edema, claudication. Remaining systems are negative.  Physical Exam: Well-developed well-nourished in no acute distress.  Skin is warm and dry.  HEENT is normal.  Neck is supple.  Chest is clear to auscultation with normal expansion.  Cardiovascular exam is irregular Abdominal exam nontender or distended. No masses palpated. Extremities show no edema. neuro grossly intact  ECG-atrial fibrillation at a rate of 64, cannot rule out prior inferior infarct.  Personally reviewed  A/P  1 PAF-pt remains in atrial fibrillation; continue apixaban and atenolol; DCCV has been scheduled; if he does not hold sinus, rate control and anticoagulation likely best option as he is asymptomatic. Will repeat echo.    2 CAD-no CP; continue statin; no ASA given need for apixaban.   3 AAA-fu ultrasound 12/21.  4 TAA-FU CTA 5/22.  5 hyperlipidemia-continue statin.  6 hypertension-BP controlled; continue present meds and follow.   Kirk Ruths, MD

## 2019-11-24 NOTE — H&P (View-Only) (Signed)
HPI: FUAtrial fibrillation, AAA andcoronary artery disease. Hadcoronary artery bypassing graft in July of 2001 with a LIMA to the LAD, RIMA to the circumflex and saphenous vein graft to the PDA. Patient has known peripheral vascular disease followed by vascular surgery.Nuclear study March 2020 showed no ischemia. Echocardiogram March 2020 showed normal LV function, grade 2 diastolic dysfunction, mildly dilated aortic root at 42 mm.  Monitor October 2020 showed sinus with occasional PACs and PVCs.  Abdominal ultrasound 12/20 showed 3.1 cm AAA. ABIs 3/21 showed moderate decrease on the left. Chest CT 5/21 showed 4 cm thoracic aortic aneurysm. Recently noted to be in atrial fibrillation; seen in afib clinic and DCCV has been scheduled. Since last seen  patient denies dyspnea, chest pain, palpitations or syncope.  No bleeding.  Current Outpatient Medications  Medication Sig Dispense Refill  . apixaban (ELIQUIS) 5 MG TABS tablet Take 1 tablet (5 mg total) by mouth 2 (two) times daily. 60 tablet 0  . atenolol (TENORMIN) 25 MG tablet TAKE ONE (1) TABLET BY MOUTH EVERY DAY 90 tablet 1  . atorvastatin (LIPITOR) 40 MG tablet TAKE ONE (1) TABLET BY MOUTH EACH DAY AT6PM (Patient taking differently: Take 40 mg by mouth daily. ) 30 tablet 11  . cholecalciferol (VITAMIN D) 1000 units tablet Take 1,000 Units by mouth daily.    Marland Kitchen latanoprost (XALATAN) 0.005 % ophthalmic solution Place 1 drop into both eyes at bedtime.     Marland Kitchen levothyroxine (SYNTHROID) 75 MCG tablet TAKE ONE (1) TABLET BY MOUTH EVERY DAY (Patient taking differently: Take 75 mcg by mouth daily before breakfast. ) 90 tablet 0  . loratadine (ALAVERT) 10 MG tablet Take 10 mg by mouth daily as needed for allergies.    Marland Kitchen losartan (COZAAR) 25 MG tablet Take 1 tablet (25 mg total) by mouth daily. 30 tablet 3  . metFORMIN (GLUCOPHAGE-XR) 500 MG 24 hr tablet TAKE TWO (2) TABLETS BY MOUTH EVERY MORNING WITH BREAKFAST 180 tablet 1  . nitroGLYCERIN  (NITROSTAT) 0.4 MG SL tablet Place 1 tablet (0.4 mg total) under the tongue every 5 (five) minutes as needed for chest pain. 25 tablet 11  . ONETOUCH DELICA LANCETS 37T MISC USE TO CHECK BLOOD SUGAR ONCE DAILY 100 each 1  . ONETOUCH ULTRA test strip USE 1 STRIP DAILY 100 each 0  . tamsulosin (FLOMAX) 0.4 MG CAPS capsule TAKE ONE CAPSULE BY MOUTH DAILY (Patient taking differently: Take 0.4 mg by mouth daily. ) 30 capsule 2  . finasteride (PROSCAR) 5 MG tablet TAKE ONE (1) TABLET BY MOUTH EVERY DAY (Patient not taking: Reported on 11/23/2019) 30 tablet 2   No current facility-administered medications for this visit.     Past Medical History:  Diagnosis Date  . Anemia 09/24/2013  . CAD (coronary artery disease)   . Cellulitis 05/14/2013   RLE  . Diverticulitis   . ED (erectile dysfunction)   . Glaucoma 08/18/2014  . History of sleep apnea    had surgery to correct  . Hypertension   . Hypothyroidism   . Medicare annual wellness visit, subsequent 04/13/2013  . Myocardial infarction ([place]) 11/05/1985  . Nocturia 03/15/2017  . Other and unspecified hyperlipidemia   . Penile lesion 02/24/2015  . Peripheral neuropathy 03/31/2012  . Peripheral vascular disease ([place])   . Postoperative anemia due to acute blood loss 09/24/2013  . Preventative health care 03/13/2016  . Sleep apnea 09/16/2017  . Type II diabetes mellitus (HCC)    fasting 100-120  .  Urinary urgency 11/22/2012    Past Surgical History:  Procedure Laterality Date  . ABDOMINAL AORTAGRAM  Oct. 9, 2014   Dr. Bridgett Larsson  . ABDOMINAL AORTAGRAM N/A 03/15/2013   Procedure: ABDOMINAL Maxcine Ham;  Surgeon: Conrad [place], MD;  Location: [facility] CATH LAB;  Service: Cardiovascular;  Laterality: N/A;  . ANTERIOR CERVICAL DECOMP/DISCECTOMY FUSION  ~ 2000  . CATARACT EXTRACTION Right   . CORONARY ARTERY BYPASS GRAFT  2001   "CABG X3" (05/14/2013)  . DENTAL SURGERY     "multiple teeth removed; trmimed top of mouth so dentures would fit" (05/14/2013)  . EYE  SURGERY     cataracts  . FEMORAL-POPLITEAL BYPASS GRAFT Right 04/25/2013   Procedure: RIGHT FEMORAL TO ABOVE KNEE POPLITEAL BYPASS GRAFT WITH RIGHT GREATER SAPHENOUS VEIN HARVEST; ULTRASOUND GUIDED;  Surgeon: Conrad [place], MD;  Location: [facility] OR;  Service: Vascular;  Laterality: Right;  . GUM SURGERY  [phone]   "had bone scraped; had periodontal disease" (05/14/2013)  . PALATE / UVULA BIOPSY / EXCISION  2003   Removed due to sleep apnea  . TONSILLECTOMY      Social History   Socioeconomic History  . Marital status: Married    Spouse name: Not on file  . Number of children: 8   . Years of education: Not on file  . Highest education level: Not on file  Occupational History    Employer: [employer]  Tobacco Use  . Smoking status: Former Smoker    Packs/day: 1.50    Years: 50.00    Pack years: 75.00    Types: Cigarettes  . Smokeless tobacco: Never Used  . Tobacco comment: 05/14/2013 "quit smoking in ~ 2004; still Uses Comit lozenges"  Substance and Sexual Activity  . Alcohol use: No    Alcohol/week: 0.0 standard drinks  . Drug use: No  . Sexual activity: Yes    Comment: lives with wife, retiring end of next week, no dietary restrictions  Other Topics Concern  . 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 Difficulty of Paying Living Expenses:   Food Insecurity:   . Worried About Charity fundraiser in the Last Year:   . Arboriculturist in the Last Year:   Transportation Needs:   . Film/video editor (Medical):   Marland Kitchen Lack of Transportation (Non-Medical):   Physical Activity:   . Days of Exercise per Week:   . Minutes of Exercise per Session:   Stress:   . Feeling of Stress :   Social Connections:   . Frequency of Communication with Friends and Family:   . Frequency of Social Gatherings with Friends and Family:   . Attends  Religious Services:   . Active Member of Clubs or Organizations:   . Attends Archivist Meetings:   Marland Kitchen Marital Status:   Intimate Partner Violence:   . Fear of Current or Ex-Partner:   . Emotionally Abused:   Marland Kitchen Physically Abused:   . Sexually Abused:     Family History  Problem Relation Age of Onset  . Hyperlipidemia Mother   . Heart disease Mother   . Diabetes Mother   . Hypertension Mother   . Heart disease Father   . Asthma Father   . Arthritis Father   . Heart disease Sister   . Heart disease  Sister        s/p bypass x 2  . Lupus Sister   . Heart disease Brother        MI waiting on heart transplant when died  . Heart disease Brother        massive MI  . Cancer Neg Hx     ROS: no fevers or chills, productive cough, hemoptysis, dysphasia, odynophagia, melena, hematochezia, dysuria, hematuria, rash, seizure activity, orthopnea, PND, pedal edema, claudication. Remaining systems are negative.  Physical Exam: Well-developed well-nourished in no acute distress.  Skin is warm and dry.  HEENT is normal.  Neck is supple.  Chest is clear to auscultation with normal expansion.  Cardiovascular exam is irregular Abdominal exam nontender or distended. No masses palpated. Extremities show no edema. neuro grossly intact  ECG-atrial fibrillation at a rate of 64, cannot rule out prior inferior infarct.  Personally reviewed  A/P  1 PAF-pt remains in atrial fibrillation; continue apixaban and atenolol; DCCV has been scheduled; if he does not hold sinus, rate control and anticoagulation likely best option as he is asymptomatic. Will repeat echo.    2 CAD-no CP; continue statin; no ASA given need for apixaban.   3 AAA-fu ultrasound 12/21.  4 TAA-FU CTA 5/22.  5 hyperlipidemia-continue statin.  6 hypertension-BP controlled; continue present meds and follow.   Kirk Ruths, MD

## 2019-11-26 ENCOUNTER — Other Ambulatory Visit: Payer: Self-pay | Admitting: Family Medicine

## 2019-11-28 ENCOUNTER — Ambulatory Visit: Payer: Medicare Other | Admitting: Cardiology

## 2019-11-28 ENCOUNTER — Encounter: Payer: Self-pay | Admitting: Cardiology

## 2019-11-28 ENCOUNTER — Other Ambulatory Visit: Payer: Self-pay

## 2019-11-28 VITALS — BP 122/86 | HR 64 | Ht 72.0 in | Wt 197.0 lb

## 2019-11-28 DIAGNOSIS — I714 Abdominal aortic aneurysm, without rupture, unspecified: Secondary | ICD-10-CM

## 2019-11-28 DIAGNOSIS — I712 Thoracic aortic aneurysm, without rupture, unspecified: Secondary | ICD-10-CM

## 2019-11-28 DIAGNOSIS — I2510 Atherosclerotic heart disease of native coronary artery without angina pectoris: Secondary | ICD-10-CM | POA: Diagnosis not present

## 2019-11-28 DIAGNOSIS — I4891 Unspecified atrial fibrillation: Secondary | ICD-10-CM

## 2019-11-28 NOTE — Patient Instructions (Signed)
Medication Instructions:  NO CHANGE *If you need a refill on your cardiac medications before your next appointment, please call your pharmacy*   Lab Work: If you have labs (blood work) drawn today and your tests are completely normal, you will receive your results only by: Marland Kitchen MyChart Message (if you have MyChart) OR . A paper copy in the mail If you have any lab test that is abnormal or we need to change your treatment, we will call you to review the results.   Testing/Procedures: Your physician has requested that you have an echocardiogram. Echocardiography is a painless test that uses sound waves to create images of your heart. It provides your doctor with information about the size and shape of your heart and how well your heart's chambers and valves are working. This procedure takes approximately one hour. There are no restrictions for this procedure.[facility] [facility]     Follow-Up: At [facility], you and your health needs are our priority.  As part of our continuing mission to provide you with exceptional heart care, we have created designated Provider Care Teams.  These Care Teams include your primary Cardiologist (physician) and Advanced Practice Providers (APPs -  Physician Assistants and Nurse Practitioners) who all work together to provide you with the care you need, when you need it.  We recommend signing up for the patient portal called "MyChart".  Sign up information is provided on this After Visit Summary.  MyChart is used to connect with patients for Virtual Visits (Telemedicine).  Patients are able to view lab/test results, encounter notes, upcoming appointments, etc.  Non-urgent messages can be sent to your provider as well.   To learn more about what you can do with MyChart, go to [email or web address].    Your next appointment:   8 week(s)  The format for your next appointment:   In Person  Provider:   Kirk Ruths, MD

## 2019-11-30 ENCOUNTER — Other Ambulatory Visit (HOSPITAL_COMMUNITY)
Admission: RE | Admit: 2019-11-30 | Discharge: 2019-11-30 | Disposition: A | Discharge status: Home / Self Care | Payer: Medicare Other | Source: Ambulatory Visit | Attending: Cardiovascular Disease | Admitting: Cardiovascular Disease

## 2019-11-30 DIAGNOSIS — Z01812 Encounter for preprocedural laboratory examination: Secondary | ICD-10-CM | POA: Insufficient documentation

## 2019-11-30 DIAGNOSIS — Z20822 Contact with and (suspected) exposure to covid-19: Secondary | ICD-10-CM | POA: Diagnosis not present

## 2019-11-30 LAB — SARS CORONAVIRUS 2 (TAT 6-24 HRS): SARS Coronavirus 2: NEGATIVE

## 2019-12-03 ENCOUNTER — Other Ambulatory Visit: Payer: Self-pay

## 2019-12-03 ENCOUNTER — Ambulatory Visit (HOSPITAL_COMMUNITY): Payer: Medicare Other | Admitting: Anesthesiology

## 2019-12-03 ENCOUNTER — Ambulatory Visit (HOSPITAL_COMMUNITY)
Admission: RE | Admit: 2019-12-03 | Discharge: 2019-12-03 | Disposition: A | Discharge status: Home / Self Care | Payer: Medicare Other | Attending: Cardiovascular Disease | Admitting: Cardiovascular Disease

## 2019-12-03 ENCOUNTER — Encounter (HOSPITAL_COMMUNITY)
Admission: RE | Disposition: A | Discharge status: Home / Self Care | Payer: Self-pay | Source: Home / Self Care | Attending: Cardiovascular Disease

## 2019-12-03 DIAGNOSIS — I714 Abdominal aortic aneurysm, without rupture: Secondary | ICD-10-CM | POA: Diagnosis not present

## 2019-12-03 DIAGNOSIS — I252 Old myocardial infarction: Secondary | ICD-10-CM | POA: Insufficient documentation

## 2019-12-03 DIAGNOSIS — I10 Essential (primary) hypertension: Secondary | ICD-10-CM | POA: Diagnosis not present

## 2019-12-03 DIAGNOSIS — Z87891 Personal history of nicotine dependence: Secondary | ICD-10-CM | POA: Insufficient documentation

## 2019-12-03 DIAGNOSIS — Z7901 Long term (current) use of anticoagulants: Secondary | ICD-10-CM | POA: Diagnosis not present

## 2019-12-03 DIAGNOSIS — I480 Paroxysmal atrial fibrillation: Secondary | ICD-10-CM | POA: Diagnosis not present

## 2019-12-03 DIAGNOSIS — I4819 Other persistent atrial fibrillation: Secondary | ICD-10-CM

## 2019-12-03 DIAGNOSIS — Z951 Presence of aortocoronary bypass graft: Secondary | ICD-10-CM | POA: Insufficient documentation

## 2019-12-03 DIAGNOSIS — I2510 Atherosclerotic heart disease of native coronary artery without angina pectoris: Secondary | ICD-10-CM | POA: Insufficient documentation

## 2019-12-03 DIAGNOSIS — I712 Thoracic aortic aneurysm, without rupture: Secondary | ICD-10-CM | POA: Insufficient documentation

## 2019-12-03 DIAGNOSIS — I4891 Unspecified atrial fibrillation: Secondary | ICD-10-CM | POA: Diagnosis not present

## 2019-12-03 DIAGNOSIS — G4730 Sleep apnea, unspecified: Secondary | ICD-10-CM | POA: Diagnosis not present

## 2019-12-03 DIAGNOSIS — E1151 Type 2 diabetes mellitus with diabetic peripheral angiopathy without gangrene: Secondary | ICD-10-CM | POA: Insufficient documentation

## 2019-12-03 DIAGNOSIS — E785 Hyperlipidemia, unspecified: Secondary | ICD-10-CM | POA: Diagnosis not present

## 2019-12-03 DIAGNOSIS — Z79890 Hormone replacement therapy (postmenopausal): Secondary | ICD-10-CM | POA: Diagnosis not present

## 2019-12-03 DIAGNOSIS — Z79899 Other long term (current) drug therapy: Secondary | ICD-10-CM | POA: Insufficient documentation

## 2019-12-03 DIAGNOSIS — E1140 Type 2 diabetes mellitus with diabetic neuropathy, unspecified: Secondary | ICD-10-CM | POA: Insufficient documentation

## 2019-12-03 DIAGNOSIS — E039 Hypothyroidism, unspecified: Secondary | ICD-10-CM | POA: Insufficient documentation

## 2019-12-03 DIAGNOSIS — D638 Anemia in other chronic diseases classified elsewhere: Secondary | ICD-10-CM | POA: Diagnosis not present

## 2019-12-03 DIAGNOSIS — Z7984 Long term (current) use of oral hypoglycemic drugs: Secondary | ICD-10-CM | POA: Insufficient documentation

## 2019-12-03 HISTORY — PX: CARDIOVERSION: SHX1299

## 2019-12-03 SURGERY — CARDIOVERSION
Anesthesia: General

## 2019-12-03 MED ORDER — PROPOFOL 10 MG/ML IV BOLUS
INTRAVENOUS | Status: DC | PRN
  Administered 2019-12-03: 80 mg via INTRAVENOUS

## 2019-12-03 MED ORDER — SODIUM CHLORIDE 0.9 % IV SOLN
INTRAVENOUS | Status: AC | PRN
  Administered 2019-12-03: 500 mL via INTRAVENOUS

## 2019-12-03 MED ORDER — LIDOCAINE 2% (20 MG/ML) 5 ML SYRINGE
INTRAMUSCULAR | Status: DC | PRN
  Administered 2019-12-03: 80 mg via INTRAVENOUS

## 2019-12-03 NOTE — Transfer of Care (Signed)
Immediate Anesthesia Transfer of Care Note  Patient: Harry Brown  Procedure(s) Performed: CARDIOVERSION (N/A )  Patient Location: PACU and Endoscopy Unit  Anesthesia Type:General  Level of Consciousness: drowsy and patient cooperative  Airway & Oxygen Therapy: Patient Spontanous Breathing and Patient connected to nasal cannula oxygen  Post-op Assessment: Report given to RN and Post -op Vital signs reviewed and stable  Post vital signs: Reviewed and stable  Last Vitals:  Vitals Value Taken Time  BP    Temp    Pulse    Resp    SpO2      Last Pain:  Vitals:   12/03/19 1043  TempSrc: Oral  PainSc: 0-No pain         Complications: No complications documented.

## 2019-12-03 NOTE — Anesthesia Preprocedure Evaluation (Signed)
Anesthesia Evaluation  Patient identified by MRN, date of birth, ID band Patient awake    Reviewed: Allergy & Precautions, NPO status , Patient's Chart, lab work & pertinent test results  Airway Mallampati: I  TM Distance: >3 FB Neck ROM: Full    Dental no notable dental hx. (+) Edentulous Upper, Edentulous Lower   Pulmonary former smoker,    Pulmonary exam normal breath sounds clear to auscultation       Cardiovascular hypertension, + CAD, + Past MI and + Peripheral Vascular Disease  Normal cardiovascular exam+ dysrhythmias Atrial Fibrillation  Rhythm:Irregular Rate:Normal  Echo 3/2020Left Ventricle: The left ventricle has normal systolic function, with an  ejection fraction of 60-65%. The cavity size was normal. There is no  increase in left ventricular wall thickness. Left ventricular diastolic  Doppler parameters are consistent with  pseudonormalization    Neuro/Psych negative neurological ROS     GI/Hepatic negative GI ROS, Neg liver ROS,   Endo/Other  diabetes, Type 2Hypothyroidism   Renal/GU      Musculoskeletal   Abdominal   Peds  Hematology   Anesthesia Other Findings   Reproductive/Obstetrics                            Anesthesia Physical Anesthesia Plan  ASA: III  Anesthesia Plan: General   Post-op Pain Management:    Induction: Intravenous  PONV Risk Score and Plan: Treatment may vary due to age or medical condition  Airway Management Planned: Nasal Cannula and Natural Airway  Additional Equipment:   Intra-op Plan:   Post-operative Plan:   Informed Consent: I have reviewed the patients History and Physical, chart, labs and discussed the procedure including the risks, benefits and alternatives for the proposed anesthesia with the patient or authorized representative who has indicated his/her understanding and acceptance.     Dental advisory given  Plan  Discussed with:   Anesthesia Plan Comments:         Anesthesia Quick Evaluation

## 2019-12-03 NOTE — Anesthesia Postprocedure Evaluation (Signed)
Anesthesia Post Note  Patient: Harry Brown  Procedure(s) Performed: CARDIOVERSION (N/A )     Patient location during evaluation: Endoscopy Anesthesia Type: General Level of consciousness: awake and alert Pain management: pain level controlled Vital Signs Assessment: post-procedure vital signs reviewed and stable Respiratory status: spontaneous breathing, nonlabored ventilation, respiratory function stable and patient connected to nasal cannula oxygen Cardiovascular status: blood pressure returned to baseline and stable Postop Assessment: no apparent nausea or vomiting Anesthetic complications: no   No complications documented.  Last Vitals:  Vitals:   12/03/19 1043  BP: (!) 165/80  Resp: 16  Temp: 36.9 C  SpO2: 100%    Last Pain:  Vitals:   12/03/19 1043  TempSrc: Oral  PainSc: 0-No pain                 Barnet Glasgow

## 2019-12-03 NOTE — Discharge Instructions (Signed)
Electrical Cardioversion   What can I expect after the procedure?  Your blood pressure, heart rate, breathing rate, and blood oxygen level will be monitored until you leave the hospital or clinic.  Your heart rhythm will be watched to make sure it does not change.  You may have some redness on the skin where the shocks were given. Follow these instructions at home:  Do not drive for 24 hours if you were given a sedative during your procedure.  Take over-the-counter and prescription medicines only as told by your health care provider.  Ask your health care provider how to check your pulse. Check it often.  Rest for 48 hours after the procedure or as told by your health care provider.  Avoid or limit your caffeine use as told by your health care provider.  Keep all follow-up visits as told by your health care provider. This is important. Contact a health care provider if:  You feel like your heart is beating too quickly or your pulse is not regular.  You have a serious muscle cramp that does not go away. Get help right away if:  You have discomfort in your chest.  You are dizzy or you feel faint.  You have trouble breathing or you are short of breath.  Your speech is slurred.  You have trouble moving an arm or leg on one side of your body.  Your fingers or toes turn cold or blue. Summary  Electrical cardioversion is the delivery of a jolt of electricity to restore a normal rhythm to the heart.  This procedure may be done right away in an emergency or may be a scheduled procedure if the condition is not an emergency.  Generally, this is a safe procedure.  After the procedure, check your pulse often as told by your health care provider. This information is not intended to replace advice given to you by your health care provider. Make sure you discuss any questions you have with your health care provider. Document Revised: 12/25/2018 Document Reviewed:  12/25/2018 Elsevier Patient Education  2020 Elsevier Inc.  

## 2019-12-03 NOTE — CV Procedure (Signed)
    Cardioversion Note  Harry Brown [id number] [birth year]  Procedure: DC Cardioversion Indications:  Atrial fib   Procedure Details Consent: Obtained Time Out: Verified patient identification, verified procedure, site/side was marked, verified correct patient position, special equipment/implants available, Radiology Safety Procedures followed,  medications/allergies/relevent history reviewed, required imaging and test results available.  Performed  The patient has been on adequate anticoagulation.  The patient received Lidocaine 80 mg IV followed by Propfol 80 mg IV  for sedation.  Synchronous cardioversion was performed at 200  joules.  The cardioversion was successful.     Complications: No apparent complications Patient did tolerate procedure well.   Thayer Headings, Brooke Bonito., MD, [facility] 12/03/2019, 11:30 AM

## 2019-12-03 NOTE — Anesthesia Procedure Notes (Signed)
Procedure Name: General with mask airway Performed by: Renato Shin, CRNA Pre-anesthesia Checklist: Patient identified, Emergency Drugs available, Suction available and Patient being monitored Patient Re-evaluated:Patient Re-evaluated prior to induction Oxygen Delivery Method: Ambu bag Preoxygenation: Pre-oxygenation with 100% oxygen Induction Type: IV induction Ventilation: Mask ventilation without difficulty Placement Confirmation: positive ETCO2 and breath sounds checked- equal and bilateral Dental Injury: Teeth and Oropharynx as per pre-operative assessment

## 2019-12-03 NOTE — Interval H&P Note (Signed)
History and Physical Interval Note:  12/03/2019 10:54 AM  [name]  has presented today for surgery, with the diagnosis of A-FIB.  The various methods of treatment have been discussed with the patient and family. After consideration of risks, benefits and other options for treatment, the patient has consented to  Procedure(s): CARDIOVERSION (N/A) as a surgical intervention.  The patient's history has been reviewed, patient examined, no change in status, stable for surgery.  I have reviewed the patient's chart and labs.  Questions were answered to the patient's satisfaction.     Mertie Moores

## 2019-12-05 ENCOUNTER — Encounter (HOSPITAL_COMMUNITY): Payer: Self-pay | Admitting: Cardiovascular Disease

## 2019-12-11 ENCOUNTER — Other Ambulatory Visit: Payer: Self-pay

## 2019-12-11 ENCOUNTER — Ambulatory Visit (HOSPITAL_COMMUNITY)
Admission: RE | Admit: 2019-12-11 | Discharge: 2019-12-11 | Disposition: A | Discharge status: Home / Self Care | Payer: Medicare Other | Source: Ambulatory Visit | Attending: Nurse Practitioner | Admitting: Nurse Practitioner

## 2019-12-11 VITALS — BP 148/60 | HR 74 | Ht 72.0 in | Wt 199.4 lb

## 2019-12-11 DIAGNOSIS — Z8349 Family history of other endocrine, nutritional and metabolic diseases: Secondary | ICD-10-CM | POA: Diagnosis not present

## 2019-12-11 DIAGNOSIS — I2510 Atherosclerotic heart disease of native coronary artery without angina pectoris: Secondary | ICD-10-CM | POA: Insufficient documentation

## 2019-12-11 DIAGNOSIS — Z79899 Other long term (current) drug therapy: Secondary | ICD-10-CM | POA: Diagnosis not present

## 2019-12-11 DIAGNOSIS — E039 Hypothyroidism, unspecified: Secondary | ICD-10-CM | POA: Insufficient documentation

## 2019-12-11 DIAGNOSIS — D6869 Other thrombophilia: Secondary | ICD-10-CM

## 2019-12-11 DIAGNOSIS — I252 Old myocardial infarction: Secondary | ICD-10-CM | POA: Diagnosis not present

## 2019-12-11 DIAGNOSIS — E1142 Type 2 diabetes mellitus with diabetic polyneuropathy: Secondary | ICD-10-CM | POA: Insufficient documentation

## 2019-12-11 DIAGNOSIS — E1151 Type 2 diabetes mellitus with diabetic peripheral angiopathy without gangrene: Secondary | ICD-10-CM | POA: Insufficient documentation

## 2019-12-11 DIAGNOSIS — Z7901 Long term (current) use of anticoagulants: Secondary | ICD-10-CM | POA: Insufficient documentation

## 2019-12-11 DIAGNOSIS — Z79890 Hormone replacement therapy (postmenopausal): Secondary | ICD-10-CM | POA: Diagnosis not present

## 2019-12-11 DIAGNOSIS — I4819 Other persistent atrial fibrillation: Secondary | ICD-10-CM | POA: Insufficient documentation

## 2019-12-11 DIAGNOSIS — I10 Essential (primary) hypertension: Secondary | ICD-10-CM | POA: Insufficient documentation

## 2019-12-11 DIAGNOSIS — Z87891 Personal history of nicotine dependence: Secondary | ICD-10-CM | POA: Diagnosis not present

## 2019-12-11 DIAGNOSIS — E7849 Other hyperlipidemia: Secondary | ICD-10-CM | POA: Insufficient documentation

## 2019-12-11 DIAGNOSIS — Z95820 Peripheral vascular angioplasty status with implants and grafts: Secondary | ICD-10-CM | POA: Diagnosis not present

## 2019-12-11 DIAGNOSIS — Z8249 Family history of ischemic heart disease and other diseases of the circulatory system: Secondary | ICD-10-CM | POA: Insufficient documentation

## 2019-12-11 DIAGNOSIS — Z7984 Long term (current) use of oral hypoglycemic drugs: Secondary | ICD-10-CM | POA: Diagnosis not present

## 2019-12-11 DIAGNOSIS — I700 Atherosclerosis of aorta: Secondary | ICD-10-CM | POA: Insufficient documentation

## 2019-12-11 DIAGNOSIS — Z833 Family history of diabetes mellitus: Secondary | ICD-10-CM | POA: Insufficient documentation

## 2019-12-11 DIAGNOSIS — I4891 Unspecified atrial fibrillation: Secondary | ICD-10-CM

## 2019-12-11 DIAGNOSIS — I714 Abdominal aortic aneurysm, without rupture: Secondary | ICD-10-CM | POA: Diagnosis not present

## 2019-12-11 NOTE — Progress Notes (Signed)
Primary Care Physician: [name], MD Referring Physician: [facility] f/u Cardiologist: Dr. Levy Pupa Harry Brown is a 79 y.o. male with a h/o HTN, AAA, hypothyroidism, CAD, DM2, PVD, that was recently seen in the ER for sharp posterior  chest pain worse with movement or deep breathing. He was incidentally found to be in asymptomatic new onset afib. Back pain was thought to be musculoskeletal in origin. He was started on an Eliquis  5 mg bid starter pack. Since he was prescribed a starter pack(for DVT/PE pt's,  NOT afib, he has received double amount DOAC than needed. He will drop back to 5 mg bid starting tonight. He is not really symptomatic with afib. He is rate controlled but his BP is elevated today. Will probably need a cardioversion after 21 days of anticoagulation.  F/u in afib clinic, 6/17. He remains in rate controlled asymptomatic afib . He has not  missed any anticoagulation since starting  6/6. He is currently on the correct dose of 5 mg eliquis bid.  His BP is much improved since starting losartan 25 mg daily. The wife reports that the pt had a few minutes of garbled speech a week ago. He refused to go to the ER. He felt that he got overheated/dehydrated as he had been working in the heat. He has  not had any further symptoms.   F/u in afib clinic, 12/11/19, he had a successful cardioversion, and remains in SR. He does not feel any different in SR. Per Dr. Jacalyn Lefevre last note if he goes back into afib, he will likely live in afib as he is asymptomatic.  Today, he denies symptoms of palpitations, chest pain, shortness of breath, orthopnea, PND, lower extremity edema, dizziness, presyncope, syncope, or neurologic sequela. The patient is tolerating medications without difficulties and is otherwise without complaint today.   Past Medical History:  Diagnosis Date  . Anemia 09/24/2013  . CAD (coronary artery disease)   . Cellulitis 05/14/2013   RLE  . Diverticulitis   . ED  (erectile dysfunction)   . Glaucoma 08/18/2014  . History of sleep apnea    had surgery to correct  . Hypertension   . Hypothyroidism   . Medicare annual wellness visit, subsequent 04/13/2013  . Myocardial infarction ([place]) 11/05/1985  . Nocturia 03/15/2017  . Other and unspecified hyperlipidemia   . Penile lesion 02/24/2015  . Peripheral neuropathy 03/31/2012  . Peripheral vascular disease ([place])   . Postoperative anemia due to acute blood loss 09/24/2013  . Preventative health care 03/13/2016  . Sleep apnea 09/16/2017  . Type II diabetes mellitus (HCC)    fasting 100-120  . Urinary urgency 11/22/2012   Past Surgical History:  Procedure Laterality Date  . ABDOMINAL AORTAGRAM  Oct. 9, 2014   Dr. Bridgett Larsson  . ABDOMINAL AORTAGRAM N/A 03/15/2013   Procedure: ABDOMINAL Maxcine Ham;  Surgeon: Conrad Celada, MD;  Location: [facility] CATH LAB;  Service: Cardiovascular;  Laterality: N/A;  . ANTERIOR CERVICAL DECOMP/DISCECTOMY FUSION  ~ 2000  . CARDIOVERSION N/A 12/03/2019   Procedure: CARDIOVERSION;  Surgeon: Acie Fredrickson Wonda Cheng, MD;  Location: [place];  Service: Cardiovascular;  Laterality: N/A;  . CATARACT EXTRACTION Right   . CORONARY ARTERY BYPASS GRAFT  2001   "CABG X3" (05/14/2013)  . DENTAL SURGERY     "multiple teeth removed; trmimed top of mouth so dentures would fit" (05/14/2013)  . EYE SURGERY     cataracts  . FEMORAL-POPLITEAL BYPASS GRAFT Right 04/25/2013  Procedure: RIGHT FEMORAL TO ABOVE KNEE POPLITEAL BYPASS GRAFT WITH RIGHT GREATER SAPHENOUS VEIN HARVEST; ULTRASOUND GUIDED;  Surgeon: Conrad [place], MD;  Location: [facility] OR;  Service: Vascular;  Laterality: Right;  . GUM SURGERY  [phone]   "had bone scraped; had periodontal disease" (05/14/2013)  . PALATE / UVULA BIOPSY / EXCISION  2003   Removed due to sleep apnea  . TONSILLECTOMY      Current Outpatient Medications  Medication Sig Dispense Refill  . apixaban (ELIQUIS) 5 MG TABS tablet Take 1 tablet (5 mg total) by mouth 2 (two) times daily. 60  tablet 0  . atenolol (TENORMIN) 25 MG tablet TAKE ONE (1) TABLET BY MOUTH EVERY DAY 90 tablet 1  . atorvastatin (LIPITOR) 40 MG tablet TAKE ONE (1) TABLET BY MOUTH EACH DAY AT6PM (Patient taking differently: Take 40 mg by mouth daily. ) 30 tablet 11  . cholecalciferol (VITAMIN D) 1000 units tablet Take 1,000 Units by mouth daily.    Marland Kitchen latanoprost (XALATAN) 0.005 % ophthalmic solution Place 1 drop into both eyes at bedtime.     Marland Kitchen levothyroxine (SYNTHROID) 75 MCG tablet TAKE ONE (1) TABLET BY MOUTH EVERY DAY (Patient taking differently: Take 75 mcg by mouth daily before breakfast. ) 90 tablet 0  . loratadine (ALAVERT) 10 MG tablet Take 10 mg by mouth daily as needed for allergies.    Marland Kitchen losartan (COZAAR) 25 MG tablet Take 1 tablet (25 mg total) by mouth daily. 30 tablet 3  . metFORMIN (GLUCOPHAGE-XR) 500 MG 24 hr tablet TAKE TWO (2) TABLETS BY MOUTH EVERY MORNING WITH BREAKFAST 180 tablet 1  . nitroGLYCERIN (NITROSTAT) 0.4 MG SL tablet Place 1 tablet (0.4 mg total) under the tongue every 5 (five) minutes as needed for chest pain. 25 tablet 11  . ONETOUCH DELICA LANCETS 16R MISC USE TO CHECK BLOOD SUGAR ONCE DAILY 100 each 1  . ONETOUCH ULTRA test strip USE 1 STRIP DAILY 100 each 0  . tamsulosin (FLOMAX) 0.4 MG CAPS capsule TAKE ONE CAPSULE BY MOUTH DAILY (Patient taking differently: Take 0.4 mg by mouth daily. ) 30 capsule 2   No current facility-administered medications for this encounter.    Allergies  Allergen Reactions  . Adhesive [Tape] Rash  . Latex Rash  . Other Hives, Swelling and Rash    Plastic shopping bags Metal staples: rash, swelling    Social History   Socioeconomic History  . Marital status: Married    Spouse name: Not on file  . Number of children: 8   . Years of education: Not on file  . Highest education level: Not on file  Occupational History    Employer: [employer]  Tobacco Use  . Smoking status: Former Smoker    Packs/day: 1.50    Years: 50.00      Pack years: 75.00    Types: Cigarettes  . Smokeless tobacco: Never Used  . Tobacco comment: 05/14/2013 "quit smoking in ~ 2004; still Uses Comit lozenges"  Substance and Sexual Activity  . Alcohol use: No    Alcohol/week: 0.0 standard drinks  . Drug use: No  . Sexual activity: Yes    Comment: lives with wife, retiring end of next week, no dietary restrictions  Other Topics Concern  . 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 Difficulty of Paying Living Expenses:  Food Insecurity:   . Worried About Charity fundraiser in the Last Year:   . Arboriculturist in the Last Year:   Transportation Needs:   . Film/video editor (Medical):   Marland Kitchen Lack of Transportation (Non-Medical):   Physical Activity:   . Days of Exercise per Week:   . Minutes of Exercise per Session:   Stress:   . Feeling of Stress :   Social Connections:   . Frequency of Communication with Friends and Family:   . Frequency of Social Gatherings with Friends and Family:   . Attends Religious Services:   . Active Member of Clubs or Organizations:   . Attends Archivist Meetings:   Marland Kitchen Marital Status:   Intimate Partner Violence:   . Fear of Current or Ex-Partner:   . Emotionally Abused:   Marland Kitchen Physically Abused:   . Sexually Abused:     Family History  Problem Relation Age of Onset  . Hyperlipidemia Mother   . Heart disease Mother   . Diabetes Mother   . Hypertension Mother   . Heart disease Father   . Asthma Father   . Arthritis Father   . Heart disease Sister   . Heart disease Sister        s/p bypass x 2  . Lupus Sister   . Heart disease Brother        MI waiting on heart transplant when died  . Heart disease Brother        massive MI  . Cancer Neg Hx     ROS- All systems are reviewed and negative except as per the HPI above  Physical Exam: Vitals:    12/11/19 1447  BP: (!) 148/60  Pulse: 74  Weight: 90.4 kg  Height: 6' (1.829 m)   Wt Readings from Last 3 Encounters:  12/11/19 90.4 kg  12/03/19 89.4 kg  11/28/19 89.4 kg    Labs: Lab Results  Component Value Date   NA 137 11/22/2019   K 4.6 11/22/2019   CL 105 11/22/2019   CO2 23 11/22/2019   GLUCOSE 104 (H) 11/22/2019   BUN 23 11/22/2019   CREATININE 1.04 11/22/2019   CALCIUM 9.1 11/22/2019   PHOS 3.7 07/24/2014   Lab Results  Component Value Date   INR 0.99 04/20/2013   Lab Results  Component Value Date   CHOL 116 11/15/2019   HDL 40.60 11/15/2019   LDLCALC 67 11/15/2019   TRIG 46.0 11/15/2019     GEN- The patient is well appearing, alert and oriented x 3 today.   Head- normocephalic, atraumatic Eyes-  Sclera clear, conjunctiva pink Ears- hearing intact Oropharynx- clear Neck- supple, no JVP Lymph- no cervical lymphadenopathy Lungs- Clear to ausculation bilaterally, normal work of breathing Heart-  regular rate and rhythm, no murmurs, rubs or gallops, PMI not laterally displaced GI- soft, NT, ND, + BS Extremities- no clubbing, cyanosis, or edema MS- no significant deformity or atrophy Skin- no rash or lesion Psych- euthymic mood, full affect Neuro- strength and sensation are intact  EKG-SR  at 74 bpm with PVC's ( PVC's chronic )  Echo-08/10/18-1. The left ventricle has normal systolic function with an ejection  fraction of 60-65%. The cavity size was normal. Left ventricular diastolic  Doppler parameters are consistent with pseudonormalization.  2. The right ventricle has normal systolic function. The cavity was  normal. There is no increase in right ventricular wall thickness.  3. The mitral valve is normal in  structure. There is mild mitral annular  calcification present.  4. The tricuspid valve is normal in structure.  5. The aortic valve is normal in structure. Mild sclerosis of the aortic  valve.  6. The pulmonic valve was normal in  structure.  7. There is moderate dilatation of the ascending aorta measuring 42 mm.    Assessment and Plan: 1.New onset persistent afib Unknown duration Asymptomatic and rate controlled Successful cardioversion and in rhythm today  Continue  atenolol 25 mg a day  2. CHA2DS2VASc score of at least 6  Continue eliquis 5 mg bid   3.HTN Improved since start of  losartan 25 mg   F/u with Dr. Stanford Breed as scheduled  afib clinic as needed   Geroge Baseman. Darrin Apodaca, [facility] [address] [place], [place] [address] [phone]

## 2019-12-18 ENCOUNTER — Other Ambulatory Visit: Payer: Self-pay | Admitting: Family Medicine

## 2019-12-28 ENCOUNTER — Other Ambulatory Visit: Payer: Self-pay | Admitting: Family Medicine

## 2020-01-04 ENCOUNTER — Ambulatory Visit (HOSPITAL_BASED_OUTPATIENT_CLINIC_OR_DEPARTMENT_OTHER)
Admission: RE | Admit: 2020-01-04 | Discharge: 2020-01-04 | Disposition: A | Discharge status: Home / Self Care | Payer: Medicare Other | Source: Ambulatory Visit | Attending: Cardiology | Admitting: Cardiology

## 2020-01-04 ENCOUNTER — Other Ambulatory Visit: Payer: Self-pay

## 2020-01-04 DIAGNOSIS — I4891 Unspecified atrial fibrillation: Secondary | ICD-10-CM | POA: Diagnosis not present

## 2020-01-08 ENCOUNTER — Other Ambulatory Visit: Payer: Self-pay | Admitting: Family Medicine

## 2020-01-08 NOTE — Telephone Encounter (Signed)
Medication:apixaban (ELIQUIS) 5 MG TABS tablet [[id number]] ENDED   Has the patient contacted their pharmacy? No. (If no, request that the patient contact the pharmacy for the refill.) (If yes, when and what did the pharmacy advise?)  Preferred Pharmacy (with phone number or street name): [facility] [place] - [address]  [address], [place] [address]  Phone:  [phone] Fax:  [phone]  DEA #:  --  Agent: Please be advised that RX refills may take up to 3 business days. We ask that you follow-up with your pharmacy.

## 2020-01-08 NOTE — Telephone Encounter (Signed)
Patient notified that rx was sent in  

## 2020-01-09 LAB — ECHOCARDIOGRAM COMPLETE
AR max vel: 2.72 cm2
AV Area VTI: 2.83 cm2
AV Area mean vel: 2.54 cm2
AV Mean grad: 4 mmHg
AV Peak grad: 7.5 mmHg
Ao pk vel: 1.37 m/s
Area-P 1/2: 3.37 cm2
MV M vel: 3.91 m/s
MV Peak grad: 61.2 mmHg
P 1/2 time: 670 msec
S' Lateral: 3.2 cm

## 2020-01-11 ENCOUNTER — Other Ambulatory Visit: Payer: Self-pay | Admitting: *Deleted

## 2020-01-11 DIAGNOSIS — I359 Nonrheumatic aortic valve disorder, unspecified: Secondary | ICD-10-CM

## 2020-01-11 NOTE — Progress Notes (Signed)
echo

## 2020-01-24 ENCOUNTER — Other Ambulatory Visit: Payer: Self-pay | Admitting: Family Medicine

## 2020-01-29 NOTE — Progress Notes (Signed)
HPI: FUAtrial fibrillation, TAA, AAA andcoronary artery disease. Hadcoronary artery bypassing graft in July of 2001 with a LIMA to the LAD, RIMA to the circumflex and saphenous vein graft to the PDA. Patient has known peripheral vascular disease followed by vascular surgery.Nuclear study March 2020 showed no ischemia. Monitor October 2020 showed sinus with occasional PACs and PVCs. Abdominal ultrasound 12/20 showed 3.1 cm AAA. ABIs 3/21 showed moderate decrease on the left. Echocardiogram July 2021 showed normal LV function, mild left atrial enlargement, mild mitral regurgitation, mild to moderate aortic insufficiency, mildly dilated a sending aorta at 40 mm and moderate pulmonary hypertension.  CTA June 2021 showed 3 cm abdominal aortic aneurysm, 4.4 cm thoracic aortic aneurysm.  Had cardioversion of atrial fibrillation December 03, 2019.  Since last seenpatient denies dyspnea, chest pain, palpitations or syncope.  No bleeding.  Current Outpatient Medications  Medication Sig Dispense Refill  . apixaban (ELIQUIS) 5 MG TABS tablet Take 1 tablet (5 mg total) by mouth 2 (two) times daily. 60 tablet 3  . atenolol (TENORMIN) 25 MG tablet TAKE ONE (1) TABLET BY MOUTH EVERY DAY 90 tablet 1  . atorvastatin (LIPITOR) 40 MG tablet TAKE ONE (1) TABLET BY MOUTH EACH DAY AT6PM (Patient taking differently: Take 40 mg by mouth daily. ) 30 tablet 11  . cholecalciferol (VITAMIN D) 1000 units tablet Take 1,000 Units by mouth daily.    Marland Kitchen latanoprost (XALATAN) 0.005 % ophthalmic solution Place 1 drop into both eyes at bedtime.     Marland Kitchen levothyroxine (SYNTHROID) 75 MCG tablet TAKE ONE (1) TABLET BY MOUTH EVERY DAY 90 tablet 0  . loratadine (ALAVERT) 10 MG tablet Take 10 mg by mouth daily as needed for allergies.    Marland Kitchen losartan (COZAAR) 25 MG tablet Take 1 tablet (25 mg total) by mouth daily. 30 tablet 3  . metFORMIN (GLUCOPHAGE-XR) 500 MG 24 hr tablet TAKE TWO (2) TABLETS BY MOUTH EVERY MORNING WITH BREAKFAST 180  tablet 1  . nitroGLYCERIN (NITROSTAT) 0.4 MG SL tablet Place 1 tablet (0.4 mg total) under the tongue every 5 (five) minutes as needed for chest pain. 25 tablet 11  . ONETOUCH DELICA LANCETS 30Q MISC USE TO CHECK BLOOD SUGAR ONCE DAILY 100 each 1  . ONETOUCH ULTRA test strip USE 1 STRIP DAILY 100 each 0  . tamsulosin (FLOMAX) 0.4 MG CAPS capsule TAKE ONE CAPSULE BY MOUTH DAILY 30 capsule 2   No current facility-administered medications for this visit.     Past Medical History:  Diagnosis Date  . Anemia 09/24/2013  . CAD (coronary artery disease)   . Cellulitis 05/14/2013   RLE  . Diverticulitis   . ED (erectile dysfunction)   . Glaucoma 08/18/2014  . History of sleep apnea    had surgery to correct  . Hypertension   . Hypothyroidism   . Medicare annual wellness visit, subsequent 04/13/2013  . Myocardial infarction ([place]) 11/05/1985  . Nocturia 03/15/2017  . Other and unspecified hyperlipidemia   . Penile lesion 02/24/2015  . Peripheral neuropathy 03/31/2012  . Peripheral vascular disease ([place])   . Postoperative anemia due to acute blood loss 09/24/2013  . Preventative health care 03/13/2016  . Sleep apnea 09/16/2017  . Type II diabetes mellitus (HCC)    fasting 100-120  . Urinary urgency 11/22/2012    Past Surgical History:  Procedure Laterality Date  . ABDOMINAL AORTAGRAM  Oct. 9, 2014   Dr. Bridgett Larsson  . ABDOMINAL AORTAGRAM N/A 03/15/2013   Procedure: ABDOMINAL  Maxcine Ham;  Surgeon: Conrad Atwood, MD;  [facility] C[facility] LAB;  Service: Cardiovascular;  Laterality: N/A;  . ANTERIOR CERVICAL DECOMP/DISCECTOMY FUSION  ~ 2000  . CARDIOVERSION N/A 12/03/2019   Procedure: CARDIOVERSION;  Surgeon: Acie [place]rickson Wonda Cheng, MD;  Location: [place];  Service: Cardiovascular;  Laterality: N/A;  . CATARACT EXTRACTION Right   . CORONARY ARTERY BYPASS GRAFT  2001   "CABG X3" (05/14/2013)  . DENTAL SURGERY     "multiple teeth removed; trmimed top of mouth so dentures would fit" (05/14/2013)  . EYE SURGERY      cataracts  . FEMORAL-POPLITEAL BYPASS GRAFT Right 04/25/2013   Procedure: RIGHT FEMORAL TO ABOVE KNEE POPLITEAL BYPASS GRAFT WITH RIGHT GREATER SAPHENOUS VEIN HARVEST; ULTRASOUND GUIDED;  Surgeon: Conrad Fayette, MD;  Location: [facility] OR;  Service: Vascular;  Laterality: Right;  . GUM SURGERY  [phone]   "had bone scraped; had periodontal disease" (05/14/2013)  . PALATE / UVULA BIOPSY / EXCISION  2003   Removed due to sleep apnea  . TONSILLECTOMY      Social History   Socioeconomic History  . Marital status: Married    Spouse name: Not on file  . Number of children: 8   . Years of education: Not on file  . Highest education level: Not on file  Occupational History    Employer: [employer]  Tobacco Use  . Smoking status: Former Smoker    Packs/day: 1.50    Years: 50.00    Pack years: 75.00    Types: Cigarettes  . Smokeless tobacco: Never Used  . Tobacco comment: 05/14/2013 "quit smoking in ~ 2004; still Uses Comit lozenges"  Substance and Sexual Activity  . Alcohol use: No    Alcohol/week: 0.0 standard drinks  . Drug use: No  . Sexual activity: Yes    Comment: lives with wife, retiring end of next week, no dietary restrictions  Other Topics Concern  . 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 Difficulty of Paying Living Expenses: Not on file  Food Insecurity:   . Worried About Charity fundraiser in the Last Year: Not on file  . Ran Out of Food in the Last Year: Not on file  Transportation Needs:   . Lack of Transportation (Medical): Not on file  . Lack of Transportation (Non-Medical): Not on file  Physical Activity:   . Days of Exercise per Week: Not on file  . Minutes of Exercise per Session: Not on file  Stress:   . Feeling of Stress : Not on file  Social Connections:   . Frequency of Communication with Friends and Family:  Not on file  . Frequency of Social Gatherings with Friends and Family: Not on file  . Attends Religious Services: Not on file  . Active Member of Clubs or Organizations: Not on file  . Attends Archivist Meetings: Not on file  . Marital Status: Not on file  Intimate Partner Violence:   . Fear of Current or Ex-Partner: Not on file  . Emotionally Abused: Not on file  . Physically Abused: Not on file  . Sexually Abused: Not on file    Family History  Problem Relation Age of Onset  . Hyperlipidemia Mother   . Heart disease Mother   . Diabetes Mother   . Hypertension Mother   .  Heart disease Father   . Asthma Father   . Arthritis Father   . Heart disease Sister   . Heart disease Sister        s/p bypass x 2  . Lupus Sister   . Heart disease Brother        MI waiting on heart transplant when died  . Heart disease Brother        massive MI  . Cancer Neg Hx     ROS: no fevers or chills, productive cough, hemoptysis, dysphasia, odynophagia, melena, hematochezia, dysuria, hematuria, rash, seizure activity, orthopnea, PND, pedal edema, claudication. Remaining systems are negative.  Physical Exam: Well-developed well-nourished in no acute distress.  Skin is warm and dry.  HEENT is normal.  Neck is supple.  Chest is clear to auscultation with normal expansion.  Cardiovascular exam is irregular Abdominal exam nontender or distended. No masses palpated. Extremities show no edema. neuro grossly intact  ECG-atrial fibrillation with PVCs or aberrantly conducted beats.  Personally reviewed  A/P  1 paroxysmal atrial fibrillation-patient is back in atrial fibrillation today.  However he is asymptomatic.  We will therefore plan rate control and anticoagulation.  Continue beta-blocker and apixaban.   2 coronary artery disease-patient denies chest pain.  Continue statin.  He is not on aspirin given need for apixaban.  3 hypertension-blood pressure controlled.  Continue present  medical regimen.  4 hyperlipidemia-continue statin.  5 thoracic aortic aneurysm-patient will need follow-up CTA June 2022.  6 abdominal aortic aneurysm-plan follow-up ultrasound June 2022.  Kirk Ruths, MD

## 2020-02-06 ENCOUNTER — Encounter: Payer: Self-pay | Admitting: Cardiology

## 2020-02-06 ENCOUNTER — Other Ambulatory Visit: Payer: Self-pay

## 2020-02-06 ENCOUNTER — Ambulatory Visit: Payer: Medicare Other | Admitting: Cardiology

## 2020-02-06 VITALS — BP 122/60 | HR 77 | Ht 72.0 in | Wt 197.4 lb

## 2020-02-06 DIAGNOSIS — I2510 Atherosclerotic heart disease of native coronary artery without angina pectoris: Secondary | ICD-10-CM | POA: Diagnosis not present

## 2020-02-06 DIAGNOSIS — I714 Abdominal aortic aneurysm, without rupture, unspecified: Secondary | ICD-10-CM

## 2020-02-06 DIAGNOSIS — I4821 Permanent atrial fibrillation: Secondary | ICD-10-CM | POA: Diagnosis not present

## 2020-02-06 DIAGNOSIS — I712 Thoracic aortic aneurysm, without rupture, unspecified: Secondary | ICD-10-CM

## 2020-02-06 DIAGNOSIS — I359 Nonrheumatic aortic valve disorder, unspecified: Secondary | ICD-10-CM | POA: Diagnosis not present

## 2020-02-06 NOTE — Patient Instructions (Signed)
Medication Instructions:  NO CHANGE *If you need a refill on your cardiac medications before your next appointment, please call your pharmacy*   Lab Work: If you have labs (blood work) drawn today and your tests are completely normal, you will receive your results only by: Marland Kitchen MyChart Message (if you have MyChart) OR . A paper copy in the mail If you have any lab test that is abnormal or we need to change your treatment, we will call you to review the results   Follow-Up: At [facility], you and your health needs are our priority.  As part of our continuing mission to provide you with exceptional heart care, we have created designated Provider Care Teams.  These Care Teams include your primary Cardiologist (physician) and Advanced Practice Providers (APPs -  Physician Assistants and Nurse Practitioners) who all work together to provide you with the care you need, when you need it.  We recommend signing up for the patient portal called "MyChart".  Sign up information is provided on this After Visit Summary.  MyChart is used to connect with patients for Virtual Visits (Telemedicine).  Patients are able to view lab/test results, encounter notes, upcoming appointments, etc.  Non-urgent messages can be sent to your provider as well.   To learn more about what you can do with MyChart, go to [email or web address].    Your next appointment:   6 month(s)  The format for your next appointment:   In Person  Provider:   Kirk Ruths, MD

## 2020-02-07 ENCOUNTER — Other Ambulatory Visit (HOSPITAL_COMMUNITY): Payer: Self-pay | Admitting: Nurse Practitioner

## 2020-02-21 ENCOUNTER — Telehealth: Payer: Self-pay | Admitting: Family Medicine

## 2020-02-21 NOTE — Progress Notes (Signed)
  Chronic Care Management   Note  02/21/2020 Name: Harry Brown MRN: [id number] DOB: 1941/04/30  Harry Brown is a 79 y.o. year old male who is a primary care patient of [name], MD. I reached out to [name] by phone today in response to a referral sent by Harry Brown's PCP, [name], MD.   Harry Brown was given information about Chronic Care Management services today including:  1. CCM service includes personalized support from designated clinical staff supervised by his physician, including individualized plan of care and coordination with other care providers 2. 24/7 contact phone numbers for assistance for urgent and routine care needs. 3. Service will only be billed when office clinical staff spend 20 minutes or more in a month to coordinate care. 4. Only one practitioner may furnish and bill the service in a calendar month. 5. The patient may stop CCM services at any time (effective at the end of the month) by phone call to the office staff.   Patient agreed to services and verbal consent obtained.   Follow up plan:   Carley Perdue [employer] Scheduler

## 2020-02-29 DIAGNOSIS — H04123 Dry eye syndrome of bilateral lacrimal glands: Secondary | ICD-10-CM | POA: Diagnosis not present

## 2020-02-29 DIAGNOSIS — Z961 Presence of intraocular lens: Secondary | ICD-10-CM | POA: Diagnosis not present

## 2020-02-29 DIAGNOSIS — H401221 Low-tension glaucoma, left eye, mild stage: Secondary | ICD-10-CM | POA: Diagnosis not present

## 2020-02-29 DIAGNOSIS — H401211 Low-tension glaucoma, right eye, mild stage: Secondary | ICD-10-CM | POA: Diagnosis not present

## 2020-03-14 ENCOUNTER — Other Ambulatory Visit: Payer: Self-pay

## 2020-03-14 ENCOUNTER — Ambulatory Visit: Payer: Medicare Other | Attending: Internal Medicine

## 2020-03-14 ENCOUNTER — Other Ambulatory Visit (HOSPITAL_BASED_OUTPATIENT_CLINIC_OR_DEPARTMENT_OTHER): Payer: Self-pay | Admitting: Internal Medicine

## 2020-03-14 DIAGNOSIS — Z23 Encounter for immunization: Secondary | ICD-10-CM

## 2020-03-14 NOTE — Progress Notes (Signed)
   Covid-19 Vaccination Clinic  Name:  Harry Brown    MRN: [id number] DOB: Dec 01, 1940  03/14/2020  [name] was observed post Covid-19 immunization for 15 minutes without incident. He was provided with Vaccine Information Sheet and instruction to access the V-Safe system. Vaccinated by [employer].  [name] was instructed to call 911 with any severe reactions post vaccine: Marland Kitchen Difficulty breathing  . Swelling of face and throat  . A fast heartbeat  . A bad rash all over body  . Dizziness and weakness

## 2020-03-21 MED FILL — PFIZER-BIONTECH COVID-19 VA: 30 | 1 days supply | Qty: 0 | Fill #0

## 2020-03-31 ENCOUNTER — Other Ambulatory Visit: Payer: Self-pay | Admitting: Family Medicine

## 2020-04-03 ENCOUNTER — Other Ambulatory Visit: Payer: Self-pay

## 2020-04-03 ENCOUNTER — Ambulatory Visit (INDEPENDENT_AMBULATORY_CARE_PROVIDER_SITE_OTHER): Payer: Medicare Other | Admitting: Family Medicine

## 2020-04-03 ENCOUNTER — Ambulatory Visit (HOSPITAL_BASED_OUTPATIENT_CLINIC_OR_DEPARTMENT_OTHER)
Admission: RE | Admit: 2020-04-03 | Discharge: 2020-04-03 | Disposition: A | Discharge status: Home / Self Care | Payer: Medicare Other | Source: Ambulatory Visit | Attending: Family Medicine | Admitting: Family Medicine

## 2020-04-03 ENCOUNTER — Encounter: Payer: Self-pay | Admitting: Family Medicine

## 2020-04-03 VITALS — BP 130/78 | HR 57 | Temp 96.5°F | Wt 198.4 lb

## 2020-04-03 DIAGNOSIS — E039 Hypothyroidism, unspecified: Secondary | ICD-10-CM | POA: Diagnosis not present

## 2020-04-03 DIAGNOSIS — Z23 Encounter for immunization: Secondary | ICD-10-CM

## 2020-04-03 DIAGNOSIS — I700 Atherosclerosis of aorta: Secondary | ICD-10-CM

## 2020-04-03 DIAGNOSIS — I4821 Permanent atrial fibrillation: Secondary | ICD-10-CM

## 2020-04-03 DIAGNOSIS — M545 Low back pain, unspecified: Secondary | ICD-10-CM | POA: Diagnosis not present

## 2020-04-03 DIAGNOSIS — E782 Mixed hyperlipidemia: Secondary | ICD-10-CM

## 2020-04-03 DIAGNOSIS — D649 Anemia, unspecified: Secondary | ICD-10-CM

## 2020-04-03 DIAGNOSIS — D539 Nutritional anemia, unspecified: Secondary | ICD-10-CM

## 2020-04-03 DIAGNOSIS — I10 Essential (primary) hypertension: Secondary | ICD-10-CM

## 2020-04-03 DIAGNOSIS — E1122 Type 2 diabetes mellitus with diabetic chronic kidney disease: Secondary | ICD-10-CM | POA: Diagnosis not present

## 2020-04-03 DIAGNOSIS — R202 Paresthesia of skin: Secondary | ICD-10-CM | POA: Insufficient documentation

## 2020-04-03 DIAGNOSIS — E1151 Type 2 diabetes mellitus with diabetic peripheral angiopathy without gangrene: Secondary | ICD-10-CM

## 2020-04-03 DIAGNOSIS — E118 Type 2 diabetes mellitus with unspecified complications: Secondary | ICD-10-CM

## 2020-04-03 NOTE — Assessment & Plan Note (Signed)
On Levothyroxine, continue to monitor 

## 2020-04-03 NOTE — Assessment & Plan Note (Signed)
Tolerating Eliquis and rate controlled

## 2020-04-03 NOTE — Assessment & Plan Note (Signed)
Macrocytic anemia, check labs

## 2020-04-03 NOTE — Assessment & Plan Note (Signed)
Well controlled, no changes to meds. Encouraged heart healthy diet such as the DASH diet and exercise as tolerated.  °

## 2020-04-03 NOTE — Assessment & Plan Note (Signed)
hgba1c acceptable, minimize simple carbs. Increase exercise as tolerated. Continue current meds. Flu shot given today

## 2020-04-03 NOTE — Progress Notes (Signed)
Subjective:    Patient ID: Harry Brown, male    DOB: 08-11-1940, 79 y.o.   MRN: [id number]  Chief Complaint  Patient presents with  . Follow-up    3 month    HPI Patient is in today for follow up on chronic medical concerns. No recent febrile illness or hospitalizations. He is accompanied by his wife. Overall he feels well. He is trying to eat well and stay active. Denies CP/palp/SOB/HA/congestion/fevers/GI or GU c/o. Taking meds as prescribed  Past Medical History:  Diagnosis Date  . Anemia 09/24/2013  . CAD (coronary artery disease)   . Cellulitis 05/14/2013   RLE  . Diverticulitis   . ED (erectile dysfunction)   . Glaucoma 08/18/2014  . History of sleep apnea    had surgery to correct  . Hypertension   . Hypothyroidism   . Medicare annual wellness visit, subsequent 04/13/2013  . Myocardial infarction ([place]) 11/05/1985  . Nocturia 03/15/2017  . Other and unspecified hyperlipidemia   . Penile lesion 02/24/2015  . Peripheral neuropathy 03/31/2012  . Peripheral vascular disease ([place])   . Postoperative anemia due to acute blood loss 09/24/2013  . Preventative health care 03/13/2016  . Sleep apnea 09/16/2017  . Type II diabetes mellitus (HCC)    fasting 100-120  . Urinary urgency 11/22/2012    Past Surgical History:  Procedure Laterality Date  . ABDOMINAL AORTAGRAM  Oct. 9, 2014   Dr. Bridgett Larsson  . ABDOMINAL AORTAGRAM N/A 03/15/2013   Procedure: ABDOMINAL Maxcine Ham;  Surgeon: Conrad Cross Anchor, MD;  Location: [facility] CATH LAB;  Service: Cardiovascular;  Laterality: N/A;  . ANTERIOR CERVICAL DECOMP/DISCECTOMY FUSION  ~ 2000  . CARDIOVERSION N/A 12/03/2019   Procedure: CARDIOVERSION;  Surgeon: Acie Fredrickson Wonda Cheng, MD;  Location: [place];  Service: Cardiovascular;  Laterality: N/A;  . CATARACT EXTRACTION Right   . CORONARY ARTERY BYPASS GRAFT  2001   "CABG X3" (05/14/2013)  . DENTAL SURGERY     "multiple teeth removed; trmimed top of mouth so dentures would fit" (05/14/2013)  . EYE SURGERY      cataracts  . FEMORAL-POPLITEAL BYPASS GRAFT Right 04/25/2013   Procedure: RIGHT FEMORAL TO ABOVE KNEE POPLITEAL BYPASS GRAFT WITH RIGHT GREATER SAPHENOUS VEIN HARVEST; ULTRASOUND GUIDED;  Surgeon: Conrad Ridgeland, MD;  Location: [facility] OR;  Service: Vascular;  Laterality: Right;  . GUM SURGERY  [phone]   "had bone scraped; had periodontal disease" (05/14/2013)  . PALATE / UVULA BIOPSY / EXCISION  2003   Removed due to sleep apnea  . TONSILLECTOMY      Family History  Problem Relation Age of Onset  . Hyperlipidemia Mother   . Heart disease Mother   . Diabetes Mother   . Hypertension Mother   . Heart disease Father   . Asthma Father   . Arthritis Father   . Heart disease Sister   . Heart disease Sister        s/p bypass x 2  . Lupus Sister   . Heart disease Brother        MI waiting on heart transplant when died  . Heart disease Brother        massive MI  . Cancer Neg Hx     Social History   Socioeconomic History  . Marital status: Married    Spouse name: Not on file  . Number of children: 8   . Years of education: Not on file  . Highest education level: Not on file  Occupational History  Employer: [employer]  Tobacco Use  . Smoking status: Former Smoker    Packs/day: 1.50    Years: 50.00    Pack years: 75.00    Types: Cigarettes  . Smokeless tobacco: Never Used  . Tobacco comment: 05/14/2013 "quit smoking in ~ 2004; still Uses Comit lozenges"  Substance and Sexual Activity  . Alcohol use: No    Alcohol/week: 0.0 standard drinks  . Drug use: No  . Sexual activity: Yes    Comment: lives with wife, retiring end of next week, no dietary restrictions  Other Topics Concern  . 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 Difficulty of Paying Living Expenses: Not on file  Food Insecurity:   . Worried About Charity fundraiser  in the Last Year: Not on file  . Ran Out of Food in the Last Year: Not on file  Transportation Needs:   . Lack of Transportation (Medical): Not on file  . Lack of Transportation (Non-Medical): Not on file  Physical Activity:   . Days of Exercise per Week: Not on file  . Minutes of Exercise per Session: Not on file  Stress:   . Feeling of Stress : Not on file  Social Connections:   . Frequency of Communication with Friends and Family: Not on file  . Frequency of Social Gatherings with Friends and Family: Not on file  . Attends Religious Services: Not on file  . Active Member of Clubs or Organizations: Not on file  . Attends Archivist Meetings: Not on file  . Marital Status: Not on file  Intimate Partner Violence:   . Fear of Current or Ex-Partner: Not on file  . Emotionally Abused: Not on file  . Physically Abused: Not on file  . Sexually Abused: Not on file    Outpatient Medications Prior to Visit  Medication Sig Dispense Refill  . apixaban (ELIQUIS) 5 MG TABS tablet Take 1 tablet (5 mg total) by mouth 2 (two) times daily. 60 tablet 3  . atenolol (TENORMIN) 25 MG tablet TAKE ONE (1) TABLET BY MOUTH EVERY DAY 90 tablet 1  . atorvastatin (LIPITOR) 40 MG tablet TAKE ONE (1) TABLET BY MOUTH EACH DAY AT6PM (Patient taking differently: Take 40 mg by mouth daily. ) 30 tablet 11  . cholecalciferol (VITAMIN D) 1000 units tablet Take 1,000 Units by mouth daily.    Marland Kitchen latanoprost (XALATAN) 0.005 % ophthalmic solution Place 1 drop into both eyes at bedtime.     Marland Kitchen levothyroxine (SYNTHROID) 75 MCG tablet TAKE ONE (1) TABLET BY MOUTH EVERY DAY 90 tablet 0  . loratadine (ALAVERT) 10 MG tablet Take 10 mg by mouth daily as needed for allergies.    Marland Kitchen losartan (COZAAR) 25 MG tablet TAKE ONE (1) TABLET BY MOUTH EVERY DAY 30 tablet 11  . metFORMIN (GLUCOPHAGE-XR) 500 MG 24 hr tablet TAKE TWO (2) TABLETS BY MOUTH EVERY MORNING WITH BREAKFAST 180 tablet 1  . ONETOUCH DELICA LANCETS 99B MISC USE  TO CHECK BLOOD SUGAR ONCE DAILY 100 each 1  . ONETOUCH ULTRA test strip USE 1 STRIP DAILY 100 each 0  . tamsulosin (FLOMAX) 0.4 MG CAPS capsule TAKE ONE CAPSULE BY MOUTH DAILY 30 capsule 2  . nitroGLYCERIN (NITROSTAT) 0.4 MG SL tablet Place 1 tablet (0.4 mg total) under the tongue every 5 (five)  minutes as needed for chest pain. 25 tablet 11   No facility-administered medications prior to visit.    Allergies  Allergen Reactions  . Adhesive [Tape] Rash  . Latex Rash  . Other Hives, Swelling and Rash    Plastic shopping bags Metal staples: rash, swelling    Review of Systems  Constitutional: Negative for fever and malaise/fatigue.  HENT: Positive for ear discharge. Negative for congestion.   Eyes: Negative for blurred vision.  Respiratory: Negative for shortness of breath.   Cardiovascular: Negative for chest pain, palpitations and leg swelling.  Gastrointestinal: Negative for abdominal pain, blood in stool and nausea.  Genitourinary: Negative for dysuria and frequency.  Musculoskeletal: Negative for falls.  Skin: Negative for rash.  Neurological: Negative for dizziness, loss of consciousness and headaches.  Endo/Heme/Allergies: Negative for environmental allergies.  Psychiatric/Behavioral: Negative for depression. The patient is not nervous/anxious.        Objective:    Physical Exam Vitals and nursing note reviewed.  Constitutional:      General: He is not in acute distress.    Appearance: He is well-developed.  HENT:     Head: Normocephalic and atraumatic.     Nose: Nose normal.  Eyes:     General:        Right eye: No discharge.        Left eye: No discharge.  Cardiovascular:     Rate and Rhythm: Normal rate and regular rhythm.     Heart sounds: No murmur heard.   Pulmonary:     Effort: Pulmonary effort is normal.     Breath sounds: Normal breath sounds.  Abdominal:     General: Bowel sounds are normal.     Palpations: Abdomen is soft.     Tenderness: There  is no abdominal tenderness.  Musculoskeletal:     Cervical back: Normal range of motion and neck supple.  Skin:    General: Skin is warm and dry.  Neurological:     Mental Status: He is alert and oriented to person, place, and time.     BP 130/78 (BP Location: Left Arm, Patient Position: Sitting, Cuff Size: Large)   Pulse (!) 57   Temp (!) 96.5 F (35.8 C) (Temporal)   Wt 198 lb 6.4 oz (90 kg)   SpO2 98%   BMI 26.91 kg/m  Wt Readings from Last 3 Encounters:  04/03/20 198 lb 6.4 oz (90 kg)  02/06/20 197 lb 6.4 oz (89.5 kg)  12/11/19 199 lb 6.4 oz (90.4 kg)    Diabetic Foot Exam - Simple   No data filed     Lab Results  Component Value Date   WBC 9.2 04/03/2020   HGB 12.5 (L) 04/03/2020   HCT 37.6 (L) 04/03/2020   PLT 258 04/03/2020   GLUCOSE 104 (H) 11/22/2019   CHOL 116 11/15/2019   TRIG 46.0 11/15/2019   HDL 40.60 11/15/2019   LDLCALC 67 11/15/2019   ALT 15 11/15/2019   AST 15 11/15/2019   NA 137 11/22/2019   K 4.6 11/22/2019   CL 105 11/22/2019   CREATININE 1.04 11/22/2019   BUN 23 11/22/2019   CO2 23 11/22/2019   TSH 5.31 (H) 11/15/2019   PSA 1.47 05/02/2018   INR 0.99 04/20/2013   HGBA1C 6.5 11/15/2019   MICROALBUR 0.7 03/11/2016    Lab Results  Component Value Date   TSH 5.31 (H) 11/15/2019   Lab Results  Component Value Date   WBC 9.2 04/03/2020  HGB 12.5 (L) 04/03/2020   HCT 37.6 (L) 04/03/2020   MCV 93.3 04/03/2020   PLT 258 04/03/2020   Lab Results  Component Value Date   NA 137 11/22/2019   K 4.6 11/22/2019   CO2 23 11/22/2019   GLUCOSE 104 (H) 11/22/2019   BUN 23 11/22/2019   CREATININE 1.04 11/22/2019   BILITOT 0.6 11/15/2019   ALKPHOS 63 11/15/2019   AST 15 11/15/2019   ALT 15 11/15/2019   PROT 6.5 11/15/2019   ALBUMIN 4.0 11/15/2019   CALCIUM 9.1 11/22/2019   ANIONGAP 9 11/22/2019   GFR 69.49 11/15/2019   Lab Results  Component Value Date   CHOL 116 11/15/2019   Lab Results  Component Value Date   HDL 40.60  11/15/2019   Lab Results  Component Value Date   LDLCALC 67 11/15/2019   Lab Results  Component Value Date   TRIG 46.0 11/15/2019   Lab Results  Component Value Date   CHOLHDL 3 11/15/2019   Lab Results  Component Value Date   HGBA1C 6.5 11/15/2019       Assessment & Plan:   Problem List Items Addressed This Visit    Hypothyroidism    On Levothyroxine, continue to monitor      Relevant Orders   TSH   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 Increase exercise as tolerated. Continue current meds. Sugar this am was 113 which is a typical number      Hyperlipidemia    Tolerating statin, encouraged heart healthy diet, avoid trans fats, minimize simple carbs and saturated fats. Increase exercise as tolerated      Relevant Orders   Lipid panel   Anemia    Macrocytic anemia, check labs       Hypertension    Well controlled, no changes to meds. Encouraged heart healthy diet such as the DASH diet and exercise as tolerated.       DM (diabetes mellitus) with complications (HCC)    [id number] acceptable, minimize simple carbs. Increase exercise as tolerated. Continue current meds. Flu shot given today      Atrial fibrillation ([place])    Tolerating Eliquis and rate controlled      Paresthesia of right lower extremity   Relevant Orders   DG Lumbar Spine 2-3 Views    Other Visit Diagnoses    Influenza vaccine administered    -  Primary   Relevant Orders   Flu Vaccine QUAD High Dose(Fluad) (Completed)   Macrocytic anemia       Relevant Orders   CBC (Completed)   Vitamin B12      I am having Gwyndolyn Saxon T. Hur "Tim" maintain his loratadine, latanoprost, cholecalciferol, OneTouch Delica Lancets 76P, nitroGLYCERIN, atorvastatin, atenolol, metFORMIN, OneTouch Ultra, apixaban, levothyroxine, losartan, and tamsulosin.  No orders of the defined types were placed in this encounter.    Penni Homans, MD

## 2020-04-03 NOTE — Assessment & Plan Note (Signed)
Tolerating statin, encouraged heart healthy diet, avoid trans fats, minimize simple carbs and saturated fats. Increase exercise as tolerated 

## 2020-04-03 NOTE — Assessment & Plan Note (Signed)
hgba1c acceptable, minimize simple carbs. Increase exercise as tolerated. Continue current meds. Sugar this am was 113 which is a typical number

## 2020-04-03 NOTE — Patient Instructions (Addendum)
Shingrix is the new shingles shot 2 shots over 2-6 months Carbohydrate Counting for Diabetes Mellitus, Adult  Carbohydrate counting is a method of keeping track of how many carbohydrates you eat. Eating carbohydrates naturally increases the amount of sugar (glucose) in the blood. Counting how many carbohydrates you eat helps keep your blood glucose within normal limits, which helps you manage your diabetes (diabetes mellitus). It is important to know how many carbohydrates you can safely have in each meal. This is different for every person. A diet and nutrition specialist (registered dietitian) can help you make a meal plan and calculate how many carbohydrates you should have at each meal and snack. Carbohydrates are found in the following foods:  Grains, such as breads and cereals.  Dried beans and soy products.  Starchy vegetables, such as potatoes, peas, and corn.  Fruit and fruit juices.  Milk and yogurt.  Sweets and snack foods, such as cake, cookies, candy, chips, and soft drinks. How do I count carbohydrates? There are two ways to count carbohydrates in food. You can use either of the methods or a combination of both. Reading "Nutrition Facts" on packaged food The "Nutrition Facts" list is included on the labels of almost all packaged foods and beverages in the [place] It includes:  The serving size.  Information about nutrients in each serving, including the grams (g) of carbohydrate per serving. To use the "Nutrition Facts":  Decide how many servings you will have.  Multiply the number of servings by the number of carbohydrates per serving.  The resulting number is the total amount of carbohydrates that you will be having. Learning standard serving sizes of other foods When you eat carbohydrate foods that are not packaged or do not include "Nutrition Facts" on the label, you need to measure the servings in order to count the amount of carbohydrates:  Measure the foods that  you will eat with a food scale or measuring cup, if needed.  Decide how many standard-size servings you will eat.  Multiply the number of servings by 15. Most carbohydrate-rich foods have about 15 g of carbohydrates per serving. ? For example, if you eat 8 oz (170 g) of strawberries, you will have eaten 2 servings and 30 g of carbohydrates (2 servings x 15 g = 30 g).  For foods that have more than one food mixed, such as soups and casseroles, you must count the carbohydrates in each food that is included. The following list contains standard serving sizes of common carbohydrate-rich foods. Each of these servings has about 15 g of carbohydrates:   hamburger bun or  English muffin.   oz (15 mL) syrup.   oz (14 g) jelly.  1 slice of bread.  1 six-inch tortilla.  3 oz (85 g) cooked rice or pasta.  4 oz (113 g) cooked dried beans.  4 oz (113 g) starchy vegetable, such as peas, corn, or potatoes.  4 oz (113 g) hot cereal.  4 oz (113 g) mashed potatoes or  of a large baked potato.  4 oz (113 g) canned or frozen fruit.  4 oz (120 mL) fruit juice.  4-6 crackers.  6 chicken nuggets.  6 oz (170 g) unsweetened dry cereal.  6 oz (170 g) plain fat-free yogurt or yogurt sweetened with artificial sweeteners.  8 oz (240 mL) milk.  8 oz (170 g) fresh fruit or one small piece of fruit.  24 oz (680 g) popped popcorn. Example of carbohydrate counting Sample meal  3 oz (85 g) chicken breast.  6 oz (170 g) brown rice.  4 oz (113 g) corn.  8 oz (240 mL) milk.  8 oz (170 g) strawberries with sugar-free whipped topping. Carbohydrate calculation 1. Identify the foods that contain carbohydrates: ? Rice. ? Corn. ? Milk. ? Strawberries. 2. Calculate how many servings you have of each food: ? 2 servings rice. ? 1 serving corn. ? 1 serving milk. ? 1 serving strawberries. 3. Multiply each number of servings by 15 g: ? 2 servings rice x 15 g = 30 g. ? 1 serving corn x 15 g  = 15 g. ? 1 serving milk x 15 g = 15 g. ? 1 serving strawberries x 15 g = 15 g. 4. Add together all of the amounts to find the total grams of carbohydrates eaten: ? 30 g + 15 g + 15 g + 15 g = 75 g of carbohydrates total. Summary  Carbohydrate counting is a method of keeping track of how many carbohydrates you eat.  Eating carbohydrates naturally increases the amount of sugar (glucose) in the blood.  Counting how many carbohydrates you eat helps keep your blood glucose within normal limits, which helps you manage your diabetes.  A diet and nutrition specialist (registered dietitian) can help you make a meal plan and calculate how many carbohydrates you should have at each meal and snack. This information is not intended to replace advice given to you by your health care provider. Make sure you discuss any questions you have with your health care provider. Document Revised: 12/16/2016 Document Reviewed: 11/05/2015 Elsevier Patient Education  [place].

## 2020-04-04 ENCOUNTER — Telehealth: Payer: Self-pay

## 2020-04-04 LAB — CBC
HCT: 37.6 % — ABNORMAL LOW (ref 38.5–50.0)
Hemoglobin: 12.5 g/dL — ABNORMAL LOW (ref 13.2–17.1)
MCH: 31 pg (ref 27.0–33.0)
MCHC: 33.2 g/dL (ref 32.0–36.0)
MCV: 93.3 fL (ref 80.0–100.0)
MPV: 10.2 fL (ref 7.5–12.5)
Platelets: 258 10*3/uL (ref 140–400)
RBC: 4.03 10*6/uL — ABNORMAL LOW (ref 4.20–5.80)
RDW: 12.5 % (ref 11.0–15.0)
WBC: 9.2 10*3/uL (ref 3.8–10.8)

## 2020-04-04 LAB — COMPREHENSIVE METABOLIC PANEL
AG Ratio: 1.5 (calc) (ref 1.0–2.5)
ALT: 21 U/L (ref 9–46)
AST: 20 U/L (ref 10–35)
Albumin: 4 g/dL (ref 3.6–5.1)
Alkaline phosphatase (APISO): 69 U/L (ref 35–144)
BUN/Creatinine Ratio: 16 (calc) (ref 6–22)
BUN: 19 mg/dL (ref 7–25)
CO2: 25 mmol/L (ref 20–32)
Calcium: 9.4 mg/dL (ref 8.6–10.3)
Chloride: 107 mmol/L (ref 98–110)
Creat: 1.16 mg/dL — ABNORMAL HIGH (ref 0.70–1.11)
Globulin: 2.6 g/dL (calc) (ref 1.9–3.7)
Glucose, Bld: 116 mg/dL — ABNORMAL HIGH (ref 65–99)
Potassium: 5.1 mmol/L (ref 3.5–5.3)
Sodium: 140 mmol/L (ref 135–146)
Total Bilirubin: 0.6 mg/dL (ref 0.2–1.2)
Total Protein: 6.6 g/dL (ref 6.1–8.1)

## 2020-04-04 LAB — LIPID PANEL
Cholesterol: 117 mg/dL (ref <200)
HDL: 37 mg/dL — ABNORMAL LOW (ref >=40)
LDL Cholesterol (Calc): 67 mg/dL (calc)
Non-HDL Cholesterol (Calc): 80 mg/dL (calc) (ref <130)
Total CHOL/HDL Ratio: 3.2 (calc) (ref <5.0)
Triglycerides: 53 mg/dL (ref <150)

## 2020-04-04 LAB — TSH: TSH: 3.7 mIU/L (ref 0.40–4.50)

## 2020-04-04 LAB — VITAMIN B12: Vitamin B-12: 613 pg/mL (ref 200–1100)

## 2020-04-04 LAB — HEMOGLOBIN A1C
Hgb A1c MFr Bld: 6.5 % of total Hgb — ABNORMAL HIGH (ref <5.7)
Mean Plasma Glucose: 140 (calc)
eAG (mmol/L): 7.7 (calc)

## 2020-04-04 NOTE — Telephone Encounter (Signed)
-----   Message from [name], MD sent at 04/04/2020  3:09 PM EDT ----- Notify labs look good, no new concerns, sugar stable. Mild anemia is stable. No changes

## 2020-04-04 NOTE — Telephone Encounter (Signed)
Pt called informed labs look good. No changes to care

## 2020-04-07 NOTE — Progress Notes (Signed)
Attempted to call pt and LVM to call back

## 2020-04-08 ENCOUNTER — Telehealth: Payer: Self-pay | Admitting: Pharmacist

## 2020-04-08 NOTE — Progress Notes (Signed)
Chronic Care Management Pharmacy Assistant   Name: Harry Brown  MRN: [id number] DOB: 1941/01/22  Reason for Encounter: Medication Review/ Initial Questions for Pharmacist appointment on   Patient Questions:  1.  Have you seen any other providers since your last visit?   2.  Any changes in your medicines or health?    PCP : [name], MD  Allergies:   Allergies  Allergen Reactions  . Adhesive [Tape] Rash  . Latex Rash  . Other Hives, Swelling and Rash    Plastic shopping bags Metal staples: rash, swelling    Medications: Outpatient Encounter Medications as of 04/08/2020  Medication Sig  . apixaban (ELIQUIS) 5 MG TABS tablet Take 1 tablet (5 mg total) by mouth 2 (two) times daily.  Marland Kitchen atenolol (TENORMIN) 25 MG tablet TAKE ONE (1) TABLET BY MOUTH EVERY DAY  . atorvastatin (LIPITOR) 40 MG tablet TAKE ONE (1) TABLET BY MOUTH EACH DAY AT6PM (Patient taking differently: Take 40 mg by mouth daily. )  . cholecalciferol (VITAMIN D) 1000 units tablet Take 1,000 Units by mouth daily.  Marland Kitchen latanoprost (XALATAN) 0.005 % ophthalmic solution Place 1 drop into both eyes at bedtime.   Marland Kitchen levothyroxine (SYNTHROID) 75 MCG tablet TAKE ONE (1) TABLET BY MOUTH EVERY DAY  . loratadine (ALAVERT) 10 MG tablet Take 10 mg by mouth daily as needed for allergies.  Marland Kitchen losartan (COZAAR) 25 MG tablet TAKE ONE (1) TABLET BY MOUTH EVERY DAY  . metFORMIN (GLUCOPHAGE-XR) 500 MG 24 hr tablet TAKE TWO (2) TABLETS BY MOUTH EVERY MORNING WITH BREAKFAST  . nitroGLYCERIN (NITROSTAT) 0.4 MG SL tablet Place 1 tablet (0.4 mg total) under the tongue every 5 (five) minutes as needed for chest pain.  Glory Rosebush DELICA LANCETS 11H MISC USE TO CHECK BLOOD SUGAR ONCE DAILY  . ONETOUCH ULTRA test strip USE 1 STRIP DAILY  . tamsulosin (FLOMAX) 0.4 MG CAPS capsule TAKE ONE CAPSULE BY MOUTH DAILY   No facility-administered encounter medications on file as of 04/08/2020.    Current Diagnosis: Patient Active Problem List    Diagnosis Date Noted  . Paresthesia of right lower extremity 04/03/2020  . Urinary frequency 11/19/2019  . Atrial fibrillation ([place]) 11/19/2019  . Bilateral shoulder pain 10/31/2018  . Educated about COVID-19 virus infection 10/31/2018  . DM (diabetes mellitus) with complications ([place]) 41/74/0814  . Tremor 05/02/2018  . Sleep apnea 09/16/2017  . Nocturia 03/15/2017  . Hypertension   . Preventative health care 03/13/2016  . Glaucoma 08/18/2014  . Need for viral immunization 09/24/2013  . Anemia 09/24/2013  . Lower back pain 05/14/2013  . Ambulatory dysfunction 05/14/2013  . Benign prostatic hyperplasia 05/14/2013  . Medicare annual wellness visit, subsequent 04/13/2013  . Abdominal aortic aneurysm ([place]) 04/03/2013  . Atherosclerosis of native arteries of extremity with intermittent claudication ([place]) 02/23/2013  . Onychomycosis 11/27/2012  . PVD (peripheral vascular disease) ([place]) 11/27/2012  . Neck pain on left side 11/22/2012  . Peripheral neuropathy 03/31/2012  . Bruit 12/29/2011  . Hypothyroidism 12/16/2011  . Type 2 diabetes mellitus with atherosclerosis of aorta ([place]) 12/16/2011  . Hyperlipidemia 12/16/2011  . Diverticulosis 12/16/2011    Goals Addressed   None    Chronic Care Management   Outreach Note  04/09/2020 Name: Harry Brown MRN: [id number] DOB: 1941/04/16  Referred by: [name], MD Reason for referral : Chronic Care Management   A second unsuccessful telephone outreach was attempted today. The patient was referred to pharmacist for assistance  with care management and care coordination.  Follow Up Plan:   SIGNATURE  [place] Shannon, [place] [facility] [place] Pharmacist Assistant [phone]

## 2020-04-09 ENCOUNTER — Ambulatory Visit: Payer: Medicare Other | Admitting: Pharmacist

## 2020-04-09 ENCOUNTER — Other Ambulatory Visit: Payer: Self-pay

## 2020-04-09 VITALS — BP 141/75 | HR 70 | Temp 98.3°F | Wt 200.2 lb

## 2020-04-09 DIAGNOSIS — E1122 Type 2 diabetes mellitus with diabetic chronic kidney disease: Secondary | ICD-10-CM

## 2020-04-09 DIAGNOSIS — I10 Essential (primary) hypertension: Secondary | ICD-10-CM

## 2020-04-09 DIAGNOSIS — E782 Mixed hyperlipidemia: Secondary | ICD-10-CM

## 2020-04-09 DIAGNOSIS — I4821 Permanent atrial fibrillation: Secondary | ICD-10-CM

## 2020-04-09 NOTE — Chronic Care Management (AMB) (Signed)
Chronic Care Management Pharmacy  Name: Harry Brown  MRN: [id number] DOB: [birth year]  Chief Complaint/ HPI  Harry Brown,  79 y.o. , male presents for their Initial CCM visit with the clinical pharmacist In office.  PCP : [name], MD  Their chronic conditions include: Hypertension, Hyperlipidemia/Atherosclerosis/PVD, Diabetes, Afib, Hypothyroidism, Allergic Rhinitis, BPH, Glaucoma  Office Visits: 04/03/20: Visit w/ Dr. Charlett Blake -   No med changes noted  Consult Visit: 02/06/20: Cardio visit w/ Dr. Stanford Breed - Pt in afib today, but asymptomatic. No med changes noted.  Medications: Outpatient Encounter Medications as of 04/09/2020  Medication Sig  . apixaban (ELIQUIS) 5 MG TABS tablet Take 1 tablet (5 mg total) by mouth 2 (two) times daily.  Marland Kitchen atenolol (TENORMIN) 25 MG tablet TAKE ONE (1) TABLET BY MOUTH EVERY Harry Brown  . atorvastatin (LIPITOR) 40 MG tablet TAKE ONE (1) TABLET BY MOUTH EACH Harry Brown AT6PM (Patient taking differently: Take 40 mg by mouth daily. )  . Cholecalciferol (VITAMIN D) 50 MCG (2000 UT) tablet Take 2,000 Units by mouth daily.   Marland Kitchen latanoprost (XALATAN) 0.005 % ophthalmic solution Place 1 drop into both eyes at bedtime.   Marland Kitchen levothyroxine (SYNTHROID) 75 MCG tablet TAKE ONE (1) TABLET BY MOUTH EVERY Harry Brown  . loratadine (ALAVERT) 10 MG tablet Take 10 mg by mouth daily as needed for allergies.  Marland Kitchen losartan (COZAAR) 25 MG tablet TAKE ONE (1) TABLET BY MOUTH EVERY Harry Brown  . metFORMIN (GLUCOPHAGE-XR) 500 MG 24 hr tablet TAKE TWO (2) TABLETS BY MOUTH EVERY Harry Brown  . nitroGLYCERIN (NITROSTAT) 0.4 MG SL tablet Place 1 tablet (0.4 mg total) under the tongue every 5 (five) minutes as needed for chest pain.  Glory Rosebush DELICA LANCETS 88F MISC USE TO CHECK BLOOD SUGAR ONCE DAILY  . ONETOUCH ULTRA test strip USE 1 STRIP DAILY  . tamsulosin (FLOMAX) 0.4 MG CAPS capsule TAKE ONE CAPSULE BY MOUTH DAILY   No facility-administered encounter medications on file as of  04/09/2020.   SDOH Screenings   Alcohol Screen:   . Last Alcohol Screening Score (AUDIT): Not on file  Depression (PHQ2-9): Low Risk   . PHQ-2 Score: 0  Financial Resource Strain: Medium Risk  . Difficulty of Paying Living Expenses: Somewhat hard  Food Insecurity:   . Worried About Charity fundraiser in the Last Year: Not on file  . Ran Out of Food in the Last Year: Not on file  Housing:   . Last Housing Risk Score: Not on file  Physical Activity:   . Days of Exercise per Week: Not on file  . Minutes of Exercise per Session: Not on file  Social Connections:   . Frequency of Communication with Friends and Family: Not on file  . Frequency of Social Gatherings with Friends and Family: Not on file  . Attends Religious Services: Not on file  . Active Member of Clubs or Organizations: Not on file  . Attends Archivist Meetings: Not on file  . Marital Status: Not on file  Stress:   . Feeling of Stress : Not on file  Tobacco Use: Medium Risk  . Smoking Tobacco Use: Former Smoker  . Smokeless Tobacco Use: Never Used  Transportation Needs:   . Film/video editor (Medical): Not on file  . Lack of Transportation (Non-Medical): Not on file     Current Diagnosis/Assessment:  Goals Addressed            This Visit's Progress   .  Chronic Care Management Pharmacy Care Plan       CARE PLAN ENTRY (see longitudinal plan of care for additional care plan information)  Current Barriers:  . Chronic Disease Management support, education, and care coordination needs related to Hypertension, Hyperlipidemia/Atherosclerosis/PVD, Diabetes, Afib, Hypothyroidism, Allergic Rhinitis, BPH, Glaucoma   Hypertension BP Readings from Last 3 Encounters:  04/09/20 (!) 141/75  04/03/20 130/78  02/06/20 122/60   . Pharmacist Clinical Goal(s): o Over the next 90 days, patient will work with PharmD and providers to maintain BP goal <130/80 . Current regimen:  . Atenolol 60m daily   . Losartan 234mdaily . Interventions: o Discussed BP goal o Requested patient to check BP 2-3 times per week and record . Patient self care activities - Over the next 90 days, patient will: o Check BP 2-3 times per week, document, and provide at future appointments o Ensure daily salt intake < 2300 mg/Christ Fullenwider  Hyperlipidemia/Atherosclerosis/PVD Lab Results  Component Value Date/Time   LDLCALC 67 04/03/2020 10:44 AM   . Pharmacist Clinical Goal(s): o Over the next 90 days, patient will work with PharmD and providers to maintain LDL goal < 70 . Current regimen:  o Atorvastatin 4040maily . Interventions: o Discussed diet and exercise o Discussed LDL goal . Patient self care activities - Over the next 90 days, patient will: o Maintain cholesterol medication regimen.   Diabetes Lab Results  Component Value Date/Time   HGBA1C 6.5 (H) 04/03/2020 10:44 AM   HGBA1C 6.5 11/15/2019 11:50 AM   . Pharmacist Clinical Goal(s): o Over the next 90 days, patient will work with PharmD and providers to maintain A1c goal <7% . Current regimen:  o Metformin XR 500m69m daily . Interventions: o Discussed diet and exercise o Discussed DM goal . Patient self care activities - Over the next 90 days, patient will: o Check blood sugar once daily, document, and provide at future appointments o Contact provider with any episodes of hypoglycemia  AFib . Pharmacist Clinical Goal(s) o Over the next 90 days, patient will work with PharmD and providers to reduce risk of stroke associated with Afib and reduce barrier to obtaining medication . Current regimen:   Eliquis 5mg 103mce daily  Atenolol 25mg 49my  . Interventions: o Start patient assistance application for Eliquis . Patient self care activities - Over the next 90 days, patient will: o Return out of pocket spend and proof of income for Eliquis patient assistance application  Medication management . Pharmacist Clinical Goal(s): o Over the  next 90 days, patient will work with PharmD and providers to maintain optimal medication adherence . Current pharmacy: [facility] . Interventions o Comprehensive medication review performed. o Continue current medication management strategy . Patient self care activities - Over the next 90 days, patient will: o Focus on medication adherence by filling and taking medications appropriately o Take medications as prescribed o Report any questions or concerns to PharmD and/or provider(s)  Initial goal documentation       Social Hx:  Married (3rd marriage) for 35 years. Two previous wives of 10 years each.  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  TVirginiaical Brecken Dewoody:  Wife sits with a person, so he does work around the house Still does insurance for 4 customers  Hypertension   BP goal is:  <130/80  Office blood pressures are  BP Readings from Last 3 Encounters:  04/09/20 (!) 141/75  04/03/20 130/78  02/06/20 122/60   Patient checks BP at home infrequently Patient home BP readings are ranging: Unable to assess  Patient has failed these meds in the past: None noted  Patient is currently controlled on the following medications:  . Atenolol 60m daily  . Losartan 223mdaily  We discussed diet and exercise extensively and BP goal  Plan -Consider checking BP 2-3 times per week and record -Continue current medications    Hyperlipidemia/Atherosclerosis/PVD   LDL goal < 70  Last lipids Lab Results  Component Value Date   CHOL 117 04/03/2020   HDL 37 (L) 04/03/2020   LDLCALC 67 04/03/2020   TRIG 53 04/03/2020   CHOLHDL 3.2 04/03/2020   Hepatic Function Latest Ref Rng & Units 04/03/2020 11/15/2019 11/10/2019  Total Protein 6.1 - 8.1 g/dL 6.6 6.5 7.4  Albumin 3.5 - 5.2 g/dL - 4.0 4.1  AST 10 - 35 U/L _0 ALT 9 - 46 U/L  _1 Alk Phosphatase 39 - 117 U/L - 63 60  Total Bilirubin 0.2 - 1.2 mg/dL 0.6 0.6 0.8  Bilirubin, Direct 0.0 - 0.2 mg/dL - - 0.2     The ASCVD Risk score (GoPaxtonia et al., 2013) failed to calculate for the following reasons:   The 2013 ASCVD risk score is only valid for ages 4055o 795 Patient has failed these meds in past: simvastatin (inefficacy) Patient is currently controlled on the following medications:  . Atorvastatin 4042maily . 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   We discussed:  diet and exercise extensively and LDL goal  Plan -Continue current medications  Diabetes   A1c goal <7%  Recent Relevant Labs: Lab Results  Component Value Date/Time   HGBA1C 6.5 (H) 04/03/2020 10:44 AM   HGBA1C 6.5 11/15/2019 11:50 AM   GFR 69.49 11/15/2019 11:50 AM   GFR 82.40 05/02/2018 10:53 AM   MICROALBUR 0.7 03/11/2016 02:47 PM   MICROALBUR 0.7 02/24/2015 09:59 AM    Last diabetic Eye exam:  Lab Results  Component Value Date/Time   HMDIABEYEEXA No Retinopathy 05/26/2018 12:00 AM    Last diabetic Foot exam: No results found for: HMDIABFOOTEX   Checking BG: Daily  Recent FBG Readings: 114 this AM; Highest 135, Lowest 99  Patient has failed these meds in past: None noted  Patient is currently controlled on the following medications: . MMarland Kitchentformin XR 500m2m daily  Diet B - honey nut cheerios with 1% milk L - PB and ritz cracker (~1/2 sleeve) D - BBQ ribs, fried okra, salad Snacks - minimal; cookies Drinks - 2-3 cups of black coffee  Exercise Limited by leg pain About 3 week ago [place] [facility] and foot went to sleep. He couldn't find the break, and almost got into a wreck. He states the episode lasted less than a minute, and has not happened since.  Feels it may have been a pinched nerve.  We discussed: diet  and exercise extensively and DM goal  Plan -Continue current medications   AFIB   Patient is currently rate controlled.  Patient has failed these meds in past: None noted  Patient is currently controlled  on the following medications:   Eliquis 49m twice daily  Atenolol 214mdaily   He is paying $47/month and states this stretches his budget.  He's not sure if he would qualify for patient assistance, but would like to attempt.  Pt signed portion of his paper in office today.  Will get Dr. BlCharlett Blakeo sign her portion. Asked patient to return proof of income and out of pocket pharmacy spend for 2021 to include with completed application.  Plan -Continue current medications  -Return proof of income and out of pocket pharmacy spend for Eliquis patient assistance application  Hypothyroidism   Lab Results  Component Value Date/Time   TSH 3.70 04/03/2020 10:44 AM   TSH 5.31 (H) 11/15/2019 11:50 AM   FREET4 0.81 05/02/2018 10:53 AM   FREET4 1.08 12/16/2011 03:25 PM    Patient has failed these meds in past: None noted  Patient is currently controlled on the following medications:  . Levothyroxine 7552mdaily  Plan -Continue current medications   Allergic Rhinitis    Patient has failed these meds in past: None noted  Patient is currently controlled on the following medications: . Loratadine 58m54m needed for allergies   Plan -Continue current medications   BPH   PSA  Date Value Ref Range Status  05/02/2018 1.47 0.10 - 4.00 ng/mL Final    Comment:    Test performed using Access Hybritech PSA Assay, a parmagnetic partical, chemiluminecent immunoassay.  03/11/2016 0.46 0.10 - 4.00 ng/mL Final  11/20/2012 0.58 <=4.00 ng/mL Final    Comment:    Test Methodology: ECLIA PSA (Electrochemiluminescence Immunoassay)   For PSA values from 2.5-4.0, particularly in younger men <60 y73rs old, the AUA and NCCN suggest testing for % Free PSA (3515) and evaluation of the rate  of increase in PSA (PSA velocity).     Patient has failed these meds in past: dutasteride (insurance coverage), finasteride (listed in D/C meds. No apparent reason for D/C) Patient is currently controlled on the following medications:  . Tamsulosin 0.4mg 50mly  Overnight urination: 1-3 times per night Reports a stream and bladder emptying   Plan -Continue current medications   Glaucoma   [facility] these meds in past: None noted  Patient is currently controlled on the following medications: . Latanoprost 0.005% 1 drop both eyes  Plan -Continue current medications  Vaccines   Reviewed and discussed patient's vaccination history.    Immunization History  Administered Date(s) Administered  . Fluad Quad(high Dose 65+) 04/20/2019, 04/03/2020  . Influenza Split 03/31/2012  . Influenza, High Dose Seasonal PF 03/20/2013, 02/24/2015, 03/11/2016, 03/15/2017, 05/02/2018  . Influenza,inj,Quad PF,6+ Mos 01/29/2014  . Influenza,trivalent, recombinat, inj, PF 04/06/2011  . PFIZER SARS-COV-2 Vaccination 06/27/2019, 07/18/2019, 03/14/2020  . Pneumococcal Conjugate-13 03/20/2013  . Pneumococcal Polysaccharide-23 10/11/2004, 02/24/2015  . Td 10/12/2003, 06/07/2009    Plan -Recommended patient receive Shingrix vaccine in pharmacy.  -Recommended patient receive Tetanus vaccine in pharmacy.  Medication Management   [facility] Uses pill box? Yes Pt endorses 100% compliance  Miscellaneous Meds Vitamin D 2000 units daily  We discussed: Discussed benefits of medication synchronization, packaging and delivery as well as enhanced pharmacist oversight with [employer]. Pt states he is "set in my ways" and would like to continue current management of medications  Plan -Continue current medication management strategy    Follow up:  1 month PAP check in 3 month phone visit  De Blanch,  PharmD Clinical Pharmacist [facility] [phone]

## 2020-04-11 NOTE — Progress Notes (Signed)
Pt  informed of providers results. No further concern

## 2020-04-14 ENCOUNTER — Other Ambulatory Visit: Payer: Self-pay

## 2020-04-14 DIAGNOSIS — E039 Hypothyroidism, unspecified: Secondary | ICD-10-CM

## 2020-04-14 DIAGNOSIS — E1122 Type 2 diabetes mellitus with diabetic chronic kidney disease: Secondary | ICD-10-CM

## 2020-04-22 NOTE — Patient Instructions (Addendum)
Visit Information  Goals Addressed            This Visit's Progress   . Chronic Care Management Pharmacy Care Plan       CARE PLAN ENTRY (see longitudinal plan of care for additional care plan information)  Current Barriers:  . Chronic Disease Management support, education, and care coordination needs related to Hypertension, Hyperlipidemia/Atherosclerosis/PVD, Diabetes, Afib, Hypothyroidism, Allergic Rhinitis, BPH, Glaucoma   Hypertension BP Readings from Last 3 Encounters:  04/09/20 (!) 141/75  04/03/20 130/78  02/06/20 122/60   . Pharmacist Clinical Goal(s): o Over the next 90 days, patient will work with PharmD and providers to maintain BP goal <130/80 . Current regimen:  . Atenolol 25mg  daily  . Losartan 25mg  daily . Interventions: o Discussed BP goal o Requested patient to check BP 2-3 times per week and record . Patient self care activities - Over the next 90 days, patient will: o Check BP 2-3 times per week, document, and provide at future appointments o Ensure daily salt intake < 2300 mg/Blayke Pinera  Hyperlipidemia/Atherosclerosis/PVD Lab Results  Component Value Date/Time   LDLCALC 67 04/03/2020 10:44 AM   . Pharmacist Clinical Goal(s): o Over the next 90 days, patient will work with PharmD and providers to maintain LDL goal < 70 . Current regimen:  o Atorvastatin 40mg  daily . Interventions: o Discussed diet and exercise o Discussed LDL goal . Patient self care activities - Over the next 90 days, patient will: o Maintain cholesterol medication regimen.   Diabetes Lab Results  Component Value Date/Time   HGBA1C 6.5 (H) 04/03/2020 10:44 AM   HGBA1C 6.5 11/15/2019 11:50 AM   . Pharmacist Clinical Goal(s): o Over the next 90 days, patient will work with PharmD and providers to maintain A1c goal <7% . Current regimen:  o Metformin XR 500mg  #2 daily . Interventions: o Discussed diet and exercise o Discussed DM goal . Patient self care activities - Over the  next 90 days, patient will: o Check blood sugar once daily, document, and provide at future appointments o Contact provider with any episodes of hypoglycemia  AFib . Pharmacist Clinical Goal(s) o Over the next 90 days, patient will work with PharmD and providers to reduce risk of stroke associated with Afib and reduce barrier to obtaining medication . Current regimen:   Eliquis 5mg  twice daily  Atenolol 25mg  daily  . Interventions: o Start patient assistance application for Eliquis . Patient self care activities - Over the next 90 days, patient will: o Return out of pocket spend and proof of income for Eliquis patient assistance application  Medication management . Pharmacist Clinical Goal(s): o Over the next 90 days, patient will work with PharmD and providers to maintain optimal medication adherence . Current pharmacy: [facility] . Interventions o Comprehensive medication review performed. o Continue current medication management strategy . Patient self care activities - Over the next 90 days, patient will: o Focus on medication adherence by filling and taking medications appropriately o Take medications as prescribed o Report any questions or concerns to PharmD and/or provider(s)  Initial goal documentation        Mr. Minion was given information about Chronic Care Management services today including:  1. CCM service includes personalized support from designated clinical staff supervised by his physician, including individualized plan of care and coordination with other care providers 2. 24/7 contact phone numbers for assistance for urgent and routine care needs. 3. Standard insurance, coinsurance, copays and deductibles apply for chronic care  management only during months in which we provide at least 20 minutes of these services. Most insurances cover these services at 100%, however patients may be responsible for any copay, coinsurance and/or deductible if applicable.  This service may help you avoid the need for more expensive face-to-face services. 4. Only one practitioner may furnish and bill the service in a calendar month. 5. The patient may stop CCM services at any time (effective at the end of the month) by phone call to the office staff.  Patient agreed to services and verbal consent obtained.   The patient verbalized understanding of instructions, educational materials, and care plan provided today and agreed to receive a mailed copy of patient instructions, educational materials, and care plan.  Telephone follow up appointment with pharmacy team member scheduled for: 07/10/2020  Melvenia Beam Garan Frappier, PharmD Clinical Pharmacist [facility] [phone]   How to Take Your Blood Pressure You can take your blood pressure at home with a machine. You may need to check your blood pressure at home:  To check if you have high blood pressure (hypertension).  To check your blood pressure over time.  To make sure your blood pressure medicine is working. Supplies needed: You will need a blood pressure machine, or monitor. You can buy one at a drugstore or online. When choosing one:  Choose one with an arm cuff.  Choose one that wraps around your upper arm. Only one finger should fit between your arm and the cuff.  Do not choose one that measures your blood pressure from your wrist or finger. Your doctor can suggest a monitor. How to prepare Avoid these things for 30 minutes before checking your blood pressure:  Drinking caffeine.  Drinking alcohol.  Eating.  Smoking.  Exercising. Five minutes before checking your blood pressure:  Pee.  Sit in a dining chair. Avoid sitting in a soft couch or armchair.  Be quiet. Do not talk. How to take your blood pressure Follow the instructions that came with your machine. If you have a digital blood pressure monitor, these may be the instructions: 1. Sit up  straight. 2. Place your feet on the floor. Do not cross your ankles or legs. 3. Rest your left arm at the level of your heart. You may rest it on a table, desk, or chair. 4. Pull up your shirt sleeve. 5. Wrap the blood pressure cuff around the upper part of your left arm. The cuff should be 1 inch (2.5 cm) above your elbow. It is best to wrap the cuff around bare skin. 6. Fit the cuff snugly around your arm. You should be able to place only one finger between the cuff and your arm. 7. Put the cord inside the groove of your elbow. 8. Press the power button. 9. Sit quietly while the cuff fills with air and loses air. 10. Write down the numbers on the screen. 11. Wait 2-3 minutes and then repeat steps 1-10. What do the numbers mean? Two numbers make up your blood pressure. The first number is called systolic pressure. The second is called diastolic pressure. An example of a blood pressure reading is "120 over 80" (or 120/80). If you are an adult and do not have a medical condition, use this guide to find out if your blood pressure is normal: Normal  First number: below 120.  Second number: below 80. Elevated  First number: 120-129.  Second number: below 80. Hypertension stage 1  First number: 130-139.  Second number: 80-89. Hypertension stage 2  First number: 140 or above.  Second number: 51 or above. Your blood pressure is above normal even if only the top or bottom number is above normal. Follow these instructions at home:  Check your blood pressure as often as your doctor tells you to.  Take your monitor to your next doctor's appointment. Your doctor will: ? Make sure you are using it correctly. ? Make sure it is working right.  Make sure you understand what your blood pressure numbers should be.  Tell your doctor if your medicines are causing side effects. Contact a doctor if:  Your blood pressure keeps being high. Get help right away if:  Your first blood pressure  number is higher than 180.  Your second blood pressure number is higher than 120. This information is not intended to replace advice given to you by your health care provider. Make sure you discuss any questions you have with your health care provider. Document Revised: 05/06/2017 Document Reviewed: 10/31/2015 Elsevier Patient Education  2020 [employer].

## 2020-04-24 ENCOUNTER — Other Ambulatory Visit: Payer: Self-pay | Admitting: Family Medicine

## 2020-04-28 ENCOUNTER — Other Ambulatory Visit: Payer: Self-pay | Admitting: Family Medicine

## 2020-04-30 ENCOUNTER — Telehealth: Payer: Self-pay | Admitting: Pharmacist

## 2020-04-30 NOTE — Progress Notes (Signed)
Chronic Care Management Pharmacy Assistant   Name: Harry Brown  MRN: [id number] DOB: [birth year]  Reason for Encounter: Medication Review/ Patient Assistance Coordination Call  Patient Questions:  1.  Have you seen any other providers since your last visit? No  2.  Any changes in your medicines or health? No  PCP : [name], MD   Their chronic conditions include: Hypertension, Hyperlipidemia/Atherosclerosis/PVD, Diabetes, Afib, Hypothyroidism, Allergic Rhinitis, BPH, Glaucoma  Allergies:   Allergies  Allergen Reactions  . Adhesive [Tape] Rash  . Latex Rash  . Other Hives, Swelling and Rash    Plastic shopping bags Metal staples: rash, swelling    Medications: Outpatient Encounter Medications as of 04/30/2020  Medication Sig  . atenolol (TENORMIN) 25 MG tablet TAKE ONE (1) TABLET BY MOUTH EVERY DAY  . atorvastatin (LIPITOR) 40 MG tablet TAKE ONE (1) TABLET BY MOUTH EACH DAY AT6PM (Patient taking differently: Take 40 mg by mouth daily. )  . Cholecalciferol (VITAMIN D) 50 MCG (2000 UT) tablet Take 2,000 Units by mouth daily.   Marland Kitchen ELIQUIS 5 MG TABS tablet TAKE ONE (1) TABLET BY MOUTH TWO (2) TIMES DAILY  . latanoprost (XALATAN) 0.005 % ophthalmic solution Place 1 drop into both eyes at bedtime.   Marland Kitchen levothyroxine (SYNTHROID) 75 MCG tablet Take 1 tablet (75 mcg total) by mouth daily before breakfast.  . loratadine (ALAVERT) 10 MG tablet Take 10 mg by mouth daily as needed for allergies.  Marland Kitchen losartan (COZAAR) 25 MG tablet TAKE ONE (1) TABLET BY MOUTH EVERY DAY  . metFORMIN (GLUCOPHAGE-XR) 500 MG 24 hr tablet TAKE TWO (2) TABLETS BY MOUTH EVERY MORNING WITH BREAKFAST  . nitroGLYCERIN (NITROSTAT) 0.4 MG SL tablet Place 1 tablet (0.4 mg total) under the tongue every 5 (five) minutes as needed for chest pain.  Glory Rosebush DELICA LANCETS 01V MISC USE TO CHECK BLOOD SUGAR ONCE DAILY  . ONETOUCH ULTRA test strip USE 1 STRIP DAILY  . tamsulosin (FLOMAX) 0.4 MG CAPS capsule TAKE  ONE CAPSULE BY MOUTH DAILY   No facility-administered encounter medications on file as of 04/30/2020.    Current Diagnosis: Patient Active Problem List   Diagnosis Date Noted  . Paresthesia of right lower extremity 04/03/2020  . Urinary frequency 11/19/2019  . Atrial fibrillation ([place]) 11/19/2019  . Bilateral shoulder pain 10/31/2018  . Educated about COVID-19 virus infection 10/31/2018  . DM (diabetes mellitus) with complications ([place]) 49/44/9675  . Tremor 05/02/2018  . Sleep apnea 09/16/2017  . Nocturia 03/15/2017  . Hypertension   . Preventative health care 03/13/2016  . Glaucoma 08/18/2014  . Need for viral immunization 09/24/2013  . Anemia 09/24/2013  . Lower back pain 05/14/2013  . Ambulatory dysfunction 05/14/2013  . Benign prostatic hyperplasia 05/14/2013  . Medicare annual wellness visit, subsequent 04/13/2013  . Abdominal aortic aneurysm ([place]) 04/03/2013  . Atherosclerosis of native arteries of extremity with intermittent claudication ([place]) 02/23/2013  . Onychomycosis 11/27/2012  . PVD (peripheral vascular disease) ([place]) 11/27/2012  . Neck pain on left side 11/22/2012  . Peripheral neuropathy 03/31/2012  . Bruit 12/29/2011  . Hypothyroidism 12/16/2011  . Type 2 diabetes mellitus with atherosclerosis of aorta ([place]) 12/16/2011  . Hyperlipidemia 12/16/2011  . Diverticulosis 12/16/2011    Goals Addressed   None    Spoke with patient to review patient assistance with Eliquis 5 mg.  Patient reports he has not sent the information to PCP office.  Advised patient application is placed on hold at PCP office until  the finacial information is received.  Patient verbalizes understanding and reports he will get information together.  Follow-Up:  Pharmacist Review   [place], [facility] Pharmacist Assistant [phone]

## 2020-05-15 ENCOUNTER — Ambulatory Visit: Payer: Self-pay | Admitting: *Deleted

## 2020-05-22 ENCOUNTER — Other Ambulatory Visit: Payer: Self-pay

## 2020-05-22 ENCOUNTER — Other Ambulatory Visit (HOSPITAL_COMMUNITY): Payer: Self-pay | Admitting: Cardiology

## 2020-05-22 ENCOUNTER — Ambulatory Visit (HOSPITAL_COMMUNITY)
Admission: RE | Admit: 2020-05-22 | Discharge: 2020-05-22 | Disposition: A | Discharge status: Home / Self Care | Payer: Medicare Other | Source: Ambulatory Visit | Attending: Cardiology | Admitting: Cardiology

## 2020-05-22 DIAGNOSIS — I714 Abdominal aortic aneurysm, without rupture, unspecified: Secondary | ICD-10-CM

## 2020-05-23 ENCOUNTER — Other Ambulatory Visit: Payer: Self-pay | Admitting: Family Medicine

## 2020-05-26 ENCOUNTER — Encounter: Payer: Self-pay | Admitting: *Deleted

## 2020-05-28 ENCOUNTER — Other Ambulatory Visit: Payer: Self-pay | Admitting: Family Medicine

## 2020-06-08 ENCOUNTER — Encounter (HOSPITAL_COMMUNITY): Payer: Self-pay | Admitting: Emergency Medicine

## 2020-06-08 ENCOUNTER — Emergency Department (HOSPITAL_COMMUNITY): Payer: Medicare Other

## 2020-06-08 ENCOUNTER — Emergency Department (HOSPITAL_COMMUNITY)
Admission: EM | Admit: 2020-06-08 | Discharge: 2020-06-08 | Disposition: A | Discharge status: Home / Self Care | Payer: Medicare Other | Source: Home / Self Care

## 2020-06-08 DIAGNOSIS — E785 Hyperlipidemia, unspecified: Secondary | ICD-10-CM | POA: Diagnosis not present

## 2020-06-08 DIAGNOSIS — E1151 Type 2 diabetes mellitus with diabetic peripheral angiopathy without gangrene: Secondary | ICD-10-CM | POA: Diagnosis not present

## 2020-06-08 DIAGNOSIS — Z87891 Personal history of nicotine dependence: Secondary | ICD-10-CM | POA: Diagnosis not present

## 2020-06-08 DIAGNOSIS — I700 Atherosclerosis of aorta: Secondary | ICD-10-CM | POA: Diagnosis not present

## 2020-06-08 DIAGNOSIS — I739 Peripheral vascular disease, unspecified: Secondary | ICD-10-CM | POA: Diagnosis not present

## 2020-06-08 DIAGNOSIS — I252 Old myocardial infarction: Secondary | ICD-10-CM | POA: Diagnosis not present

## 2020-06-08 DIAGNOSIS — R531 Weakness: Secondary | ICD-10-CM | POA: Diagnosis not present

## 2020-06-08 DIAGNOSIS — R410 Disorientation, unspecified: Secondary | ICD-10-CM | POA: Insufficient documentation

## 2020-06-08 DIAGNOSIS — Z7984 Long term (current) use of oral hypoglycemic drugs: Secondary | ICD-10-CM | POA: Diagnosis not present

## 2020-06-08 DIAGNOSIS — Z8249 Family history of ischemic heart disease and other diseases of the circulatory system: Secondary | ICD-10-CM | POA: Diagnosis not present

## 2020-06-08 DIAGNOSIS — Z951 Presence of aortocoronary bypass graft: Secondary | ICD-10-CM | POA: Diagnosis not present

## 2020-06-08 DIAGNOSIS — Z5321 Procedure and treatment not carried out due to patient leaving prior to being seen by health care provider: Secondary | ICD-10-CM | POA: Insufficient documentation

## 2020-06-08 DIAGNOSIS — E1142 Type 2 diabetes mellitus with diabetic polyneuropathy: Secondary | ICD-10-CM | POA: Diagnosis not present

## 2020-06-08 DIAGNOSIS — I340 Nonrheumatic mitral (valve) insufficiency: Secondary | ICD-10-CM | POA: Diagnosis not present

## 2020-06-08 DIAGNOSIS — R0689 Other abnormalities of breathing: Secondary | ICD-10-CM | POA: Diagnosis not present

## 2020-06-08 DIAGNOSIS — I4821 Permanent atrial fibrillation: Secondary | ICD-10-CM | POA: Diagnosis not present

## 2020-06-08 DIAGNOSIS — I4891 Unspecified atrial fibrillation: Secondary | ICD-10-CM | POA: Diagnosis not present

## 2020-06-08 DIAGNOSIS — R0902 Hypoxemia: Secondary | ICD-10-CM | POA: Diagnosis not present

## 2020-06-08 DIAGNOSIS — I10 Essential (primary) hypertension: Secondary | ICD-10-CM | POA: Diagnosis not present

## 2020-06-08 DIAGNOSIS — G4730 Sleep apnea, unspecified: Secondary | ICD-10-CM | POA: Diagnosis not present

## 2020-06-08 DIAGNOSIS — Z20822 Contact with and (suspected) exposure to covid-19: Secondary | ICD-10-CM | POA: Diagnosis not present

## 2020-06-08 DIAGNOSIS — N400 Enlarged prostate without lower urinary tract symptoms: Secondary | ICD-10-CM | POA: Diagnosis present

## 2020-06-08 DIAGNOSIS — A419 Sepsis, unspecified organism: Secondary | ICD-10-CM | POA: Diagnosis not present

## 2020-06-08 DIAGNOSIS — I2510 Atherosclerotic heart disease of native coronary artery without angina pectoris: Secondary | ICD-10-CM | POA: Diagnosis not present

## 2020-06-08 DIAGNOSIS — Z79890 Hormone replacement therapy (postmenopausal): Secondary | ICD-10-CM | POA: Diagnosis not present

## 2020-06-08 DIAGNOSIS — Z7901 Long term (current) use of anticoagulants: Secondary | ICD-10-CM | POA: Diagnosis not present

## 2020-06-08 DIAGNOSIS — Z79899 Other long term (current) drug therapy: Secondary | ICD-10-CM | POA: Diagnosis not present

## 2020-06-08 DIAGNOSIS — E118 Type 2 diabetes mellitus with unspecified complications: Secondary | ICD-10-CM | POA: Diagnosis not present

## 2020-06-08 DIAGNOSIS — E039 Hypothyroidism, unspecified: Secondary | ICD-10-CM | POA: Diagnosis not present

## 2020-06-08 DIAGNOSIS — R7881 Bacteremia: Secondary | ICD-10-CM | POA: Diagnosis not present

## 2020-06-08 DIAGNOSIS — I959 Hypotension, unspecified: Secondary | ICD-10-CM | POA: Diagnosis not present

## 2020-06-08 DIAGNOSIS — Z83438 Family history of other disorder of lipoprotein metabolism and other lipidemia: Secondary | ICD-10-CM | POA: Diagnosis not present

## 2020-06-08 DIAGNOSIS — R000 Tachycardia, unspecified: Secondary | ICD-10-CM | POA: Insufficient documentation

## 2020-06-08 DIAGNOSIS — N390 Urinary tract infection, site not specified: Secondary | ICD-10-CM | POA: Diagnosis not present

## 2020-06-08 DIAGNOSIS — A408 Other streptococcal sepsis: Secondary | ICD-10-CM | POA: Diagnosis not present

## 2020-06-08 DIAGNOSIS — B351 Tinea unguium: Secondary | ICD-10-CM | POA: Diagnosis not present

## 2020-06-08 DIAGNOSIS — B9561 Methicillin susceptible Staphylococcus aureus infection as the cause of diseases classified elsewhere: Secondary | ICD-10-CM | POA: Diagnosis not present

## 2020-06-08 DIAGNOSIS — Z91040 Latex allergy status: Secondary | ICD-10-CM | POA: Diagnosis not present

## 2020-06-08 DIAGNOSIS — M7989 Other specified soft tissue disorders: Secondary | ICD-10-CM | POA: Diagnosis not present

## 2020-06-08 DIAGNOSIS — J984 Other disorders of lung: Secondary | ICD-10-CM | POA: Diagnosis not present

## 2020-06-08 DIAGNOSIS — Z452 Encounter for adjustment and management of vascular access device: Secondary | ICD-10-CM | POA: Diagnosis not present

## 2020-06-08 DIAGNOSIS — E1169 Type 2 diabetes mellitus with other specified complication: Secondary | ICD-10-CM | POA: Diagnosis not present

## 2020-06-08 DIAGNOSIS — E7849 Other hyperlipidemia: Secondary | ICD-10-CM | POA: Diagnosis not present

## 2020-06-08 DIAGNOSIS — D649 Anemia, unspecified: Secondary | ICD-10-CM | POA: Diagnosis not present

## 2020-06-08 LAB — CBC WITH DIFFERENTIAL/PLATELET
Abs Immature Granulocytes: 0.21 10*3/uL — ABNORMAL HIGH (ref 0.00–0.07)
Basophils Absolute: 0.1 10*3/uL (ref 0.0–0.1)
Basophils Relative: 0 %
Eosinophils Absolute: 0 10*3/uL (ref 0.0–0.5)
Eosinophils Relative: 0 %
HCT: 38.3 % — ABNORMAL LOW (ref 39.0–52.0)
Hemoglobin: 12.5 g/dL — ABNORMAL LOW (ref 13.0–17.0)
Immature Granulocytes: 1 %
Lymphocytes Relative: 5 %
Lymphs Abs: 1.3 10*3/uL (ref 0.7–4.0)
MCH: 30.6 pg (ref 26.0–34.0)
MCHC: 32.6 g/dL (ref 30.0–36.0)
MCV: 93.9 fL (ref 80.0–100.0)
Monocytes Absolute: 1.4 10*3/uL — ABNORMAL HIGH (ref 0.1–1.0)
Monocytes Relative: 5 %
Neutro Abs: 23.2 10*3/uL — ABNORMAL HIGH (ref 1.7–7.7)
Neutrophils Relative %: 89 %
Platelets: 190 10*3/uL (ref 150–400)
RBC: 4.08 MIL/uL — ABNORMAL LOW (ref 4.22–5.81)
RDW: 14.6 % (ref 11.5–15.5)
WBC: 26.2 10*3/uL — ABNORMAL HIGH (ref 4.0–10.5)
nRBC: 0 % (ref 0.0–0.2)

## 2020-06-08 LAB — COMPREHENSIVE METABOLIC PANEL
ALT: 21 U/L (ref 0–44)
AST: 21 U/L (ref 15–41)
Albumin: 4.2 g/dL (ref 3.5–5.0)
Alkaline Phosphatase: 66 U/L (ref 38–126)
Anion gap: 11 (ref 5–15)
BUN: 30 mg/dL — ABNORMAL HIGH (ref 8–23)
CO2: 23 mmol/L (ref 22–32)
Calcium: 9.3 mg/dL (ref 8.9–10.3)
Chloride: 104 mmol/L (ref 98–111)
Creatinine, Ser: 1.18 mg/dL (ref 0.61–1.24)
GFR, Estimated: >60 mL/min (ref >60)
Glucose, Bld: 121 mg/dL — ABNORMAL HIGH (ref 70–99)
Potassium: 4 mmol/L (ref 3.5–5.1)
Sodium: 138 mmol/L (ref 135–145)
Total Bilirubin: 1.3 mg/dL — ABNORMAL HIGH (ref 0.3–1.2)
Total Protein: 7.5 g/dL (ref 6.5–8.1)

## 2020-06-08 LAB — URINALYSIS, ROUTINE W REFLEX MICROSCOPIC
Bacteria, UA: NONE SEEN
Bilirubin Urine: NEGATIVE
Glucose, UA: NEGATIVE mg/dL
Hgb urine dipstick: NEGATIVE
Ketones, ur: 5 mg/dL — AB
Nitrite: NEGATIVE
Protein, ur: 30 mg/dL — AB
Specific Gravity, Urine: 1.027 (ref 1.005–1.030)
pH: 6 (ref 5.0–8.0)

## 2020-06-08 LAB — PROTIME-INR
INR: 1.5 — ABNORMAL HIGH (ref 0.8–1.2)
Prothrombin Time: 17.8 seconds — ABNORMAL HIGH (ref 11.4–15.2)

## 2020-06-08 LAB — LACTIC ACID, PLASMA: Lactic Acid, Venous: 1.5 mmol/L (ref 0.5–1.9)

## 2020-06-08 NOTE — ED Triage Notes (Signed)
Arrives via EMS from home, C/C generalized weakness and confusion since yesterday. Temp of 102 at home, has taken Tylenol at home. Orthostatic w/ EMS. CBG 124. 95% RA. Patient is tachycardic with EMS, has gotten about 1 L of NS and after pressure was 113/68.

## 2020-06-08 NOTE — ED Notes (Signed)
Patient daughter arrived to transport patient home. Patient reports he is feeling better and they will follow up with PCP tomorrow. Patient ambulatory without difficulty out of department.

## 2020-06-08 NOTE — ED Notes (Signed)
Pt wants to leave and removed left forearm IV

## 2020-06-09 ENCOUNTER — Telehealth: Payer: Self-pay | Admitting: Family Medicine

## 2020-06-09 ENCOUNTER — Inpatient Hospital Stay (HOSPITAL_COMMUNITY)
Admission: EM | Admit: 2020-06-09 | Discharge: 2020-06-13 | DRG: 872 | Disposition: A | Discharge status: Home / Self Care | Payer: Medicare Other | Attending: Internal Medicine | Admitting: Internal Medicine

## 2020-06-09 ENCOUNTER — Telehealth: Payer: Self-pay | Admitting: Internal Medicine

## 2020-06-09 ENCOUNTER — Encounter (HOSPITAL_COMMUNITY): Payer: Self-pay | Admitting: Emergency Medicine

## 2020-06-09 DIAGNOSIS — E1151 Type 2 diabetes mellitus with diabetic peripheral angiopathy without gangrene: Secondary | ICD-10-CM | POA: Diagnosis present

## 2020-06-09 DIAGNOSIS — G4730 Sleep apnea, unspecified: Secondary | ICD-10-CM | POA: Diagnosis present

## 2020-06-09 DIAGNOSIS — I739 Peripheral vascular disease, unspecified: Secondary | ICD-10-CM | POA: Diagnosis present

## 2020-06-09 DIAGNOSIS — E785 Hyperlipidemia, unspecified: Secondary | ICD-10-CM | POA: Diagnosis present

## 2020-06-09 DIAGNOSIS — Z7901 Long term (current) use of anticoagulants: Secondary | ICD-10-CM

## 2020-06-09 DIAGNOSIS — Z79899 Other long term (current) drug therapy: Secondary | ICD-10-CM

## 2020-06-09 DIAGNOSIS — D649 Anemia, unspecified: Secondary | ICD-10-CM | POA: Diagnosis present

## 2020-06-09 DIAGNOSIS — R7881 Bacteremia: Principal | ICD-10-CM | POA: Diagnosis present

## 2020-06-09 DIAGNOSIS — Z8249 Family history of ischemic heart disease and other diseases of the circulatory system: Secondary | ICD-10-CM

## 2020-06-09 DIAGNOSIS — N400 Enlarged prostate without lower urinary tract symptoms: Secondary | ICD-10-CM | POA: Diagnosis present

## 2020-06-09 DIAGNOSIS — B9561 Methicillin susceptible Staphylococcus aureus infection as the cause of diseases classified elsewhere: Secondary | ICD-10-CM | POA: Diagnosis present

## 2020-06-09 DIAGNOSIS — Z91040 Latex allergy status: Secondary | ICD-10-CM

## 2020-06-09 DIAGNOSIS — Z20822 Contact with and (suspected) exposure to covid-19: Secondary | ICD-10-CM | POA: Diagnosis present

## 2020-06-09 DIAGNOSIS — I252 Old myocardial infarction: Secondary | ICD-10-CM

## 2020-06-09 DIAGNOSIS — Z87891 Personal history of nicotine dependence: Secondary | ICD-10-CM

## 2020-06-09 DIAGNOSIS — E039 Hypothyroidism, unspecified: Secondary | ICD-10-CM | POA: Diagnosis present

## 2020-06-09 DIAGNOSIS — I700 Atherosclerosis of aorta: Secondary | ICD-10-CM | POA: Diagnosis present

## 2020-06-09 DIAGNOSIS — I4891 Unspecified atrial fibrillation: Secondary | ICD-10-CM | POA: Diagnosis present

## 2020-06-09 DIAGNOSIS — Z951 Presence of aortocoronary bypass graft: Secondary | ICD-10-CM

## 2020-06-09 DIAGNOSIS — N390 Urinary tract infection, site not specified: Secondary | ICD-10-CM | POA: Diagnosis present

## 2020-06-09 DIAGNOSIS — Z95828 Presence of other vascular implants and grafts: Secondary | ICD-10-CM

## 2020-06-09 DIAGNOSIS — E1142 Type 2 diabetes mellitus with diabetic polyneuropathy: Secondary | ICD-10-CM | POA: Diagnosis present

## 2020-06-09 DIAGNOSIS — Z7984 Long term (current) use of oral hypoglycemic drugs: Secondary | ICD-10-CM

## 2020-06-09 DIAGNOSIS — E1159 Type 2 diabetes mellitus with other circulatory complications: Secondary | ICD-10-CM | POA: Diagnosis present

## 2020-06-09 DIAGNOSIS — I4821 Permanent atrial fibrillation: Secondary | ICD-10-CM | POA: Diagnosis present

## 2020-06-09 DIAGNOSIS — B351 Tinea unguium: Secondary | ICD-10-CM | POA: Diagnosis present

## 2020-06-09 DIAGNOSIS — I2510 Atherosclerotic heart disease of native coronary artery without angina pectoris: Secondary | ICD-10-CM | POA: Diagnosis present

## 2020-06-09 DIAGNOSIS — Z79890 Hormone replacement therapy (postmenopausal): Secondary | ICD-10-CM

## 2020-06-09 DIAGNOSIS — E118 Type 2 diabetes mellitus with unspecified complications: Secondary | ICD-10-CM | POA: Diagnosis present

## 2020-06-09 DIAGNOSIS — I10 Essential (primary) hypertension: Secondary | ICD-10-CM | POA: Diagnosis present

## 2020-06-09 DIAGNOSIS — Z83438 Family history of other disorder of lipoprotein metabolism and other lipidemia: Secondary | ICD-10-CM

## 2020-06-09 LAB — BLOOD CULTURE ID PANEL (REFLEXED) - BCID2

## 2020-06-09 NOTE — Telephone Encounter (Signed)
Thanks for following up with him. He definitely needs to be at the hospital

## 2020-06-09 NOTE — ED Triage Notes (Addendum)
Pt sent over by ID doctor who advised pt that his blood cultures were positive for staph aureus after he was seen at [facility] yesterday for gen weakness and confusion. A/ox4, resp e/u, nad.

## 2020-06-09 NOTE — Telephone Encounter (Signed)
Just wanted you to see this.  Would you like for [place] to see if Harry Brown can see him?

## 2020-06-09 NOTE — Telephone Encounter (Signed)
I called Harry Brown ED to fyi them about Harry Brown returning to the hospital for admission, so he can be triaged appropriately in setting of mssa bacteremia  Spoke with Chelsey from [facility] [place] triage   Blood Culture    Component Value Date/Time   SDES  06/08/2020 1342    BLOOD RIGHT ANTECUBITAL Performed at [facility], [place] [address], [place], [place] [address]    [place]  06/08/2020 1342    BOTTLES DRAWN AEROBIC AND ANAEROBIC Blood Culture adequate volume Performed at [facility] [address], [place], [place] [address]    CULT GRAM POSITIVE COCCI 06/08/2020 1342   REPTSTATUS PENDING 06/08/2020 1342

## 2020-06-09 NOTE — Telephone Encounter (Signed)
I can see him tomorrow afternoon but if he gets sicker he will need to be seen sooner.

## 2020-06-09 NOTE — Telephone Encounter (Signed)
Caller: mark, micro lab at [facility]    Aerobic bottle: MSSA

## 2020-06-09 NOTE — Telephone Encounter (Signed)
Pt's wife states that pt was in the ER on yesterday,  Wife states that she was told from the ER that they was going to contact Dr. Charlett Blake directly. Pt's wife is wanting to know if Dr.Blyth was contacted or not regarding husband visit.

## 2020-06-09 NOTE — Telephone Encounter (Signed)
Will attempt to recall patient to admission for mssa bsi

## 2020-06-09 NOTE — Telephone Encounter (Signed)
Nurse Assessment Nurse: Waymon Budge, RN, Vaughan Basta Date/Time (Eastern Time): 06/08/2020 2:53:31 PM Confirm and document reason for call. If symptomatic, describe symptoms. ---Caller states she was in the ER with her husband. States he had a spell in the night, was freezing. Then woke up this morning very weak, unable to get up Called 911, temp 102.3. They were waiting for 3 hours in the ER and she is concerned because of the Covid patients at the ER and she wants him to just been seen at the office. States they are on their way back home now. Home Covid test negative. Does the patient have any new or worsening symptoms? ---Yes Will a triage be completed? ---Yes Related visit to physician within the last 2 weeks? ---No Does the PT have any chronic conditions? (i.e. diabetes, asthma, this includes High risk factors for pregnancy, etc.) ---Yes List chronic conditions. ---A-fib, aortic aneurysm. Is this a behavioral health or substance abuse call? ---No Guidelines Guideline Title Affirmed Question Affirmed Notes Nurse Date/Time (Eastern Time) Fever [1] Fever > 101 F (38.3 C) AND [2] age > 34 years Gomez Cleverly 06/08/2020 2:59:13 PM Disp. Time Eilene Ghazi Time) Disposition Final User PLEASE NOTE: All timestamps contained within this report are represented as [place] Standard Time. CONFIDENTIALTY NOTICE: This fax transmission is intended only for the addressee. It contains information that is legally privileged, confidential or otherwise protected from use or disclosure. If you are not the intended recipient, you are strictly prohibited from reviewing, disclosing, copying using or disseminating any of this information or taking any action in reliance on or regarding this information. If you have received this fax in error, please notify us immediately by telephone so that we can arrange for its return to [place]. Phone: [phone], Toll-Free: [phone], Fax: [phone] Page: 2 of 2 Call Id:  [id number] 06/08/2020 3:03:36 PM See HCP within 4 Hours (or PCP triage) Yes Waymon Budge, RN, Phineas Semen Disagree/Comply Disagree Caller Understands Yes PreDisposition Call Doctor Care Advice Given Per Guideline SEE HCP (OR PCP TRIAGE) WITHIN 4 HOURS: * IF OFFICE WILL BE CLOSED AND NO PCP (PRIMARY CARE PROVIDER) SECOND-LEVEL TRIAGE: You need to be seen within the next 3 or 4 hours. A nearby Urgent Care Center [facility]) is often a good source of care. Another choice is to go to the ED. Go sooner if you become worse. FEVER MEDICINES: * For fevers above 101 F (38.3 C) take either acetaminophen or ibuprofen. CALL BACK IF: * You become worse CARE ADVICE given per Fever (Adult) guideline. Comments User: Roosevelt Locks, RN Date/Time Eilene Ghazi Time): 06/08/2020 3:04:51 PM Caller states she would like her husband to be seen in the office in the morning. Advised to call at 8 am. Referrals GO TO FACILITY REFUSED

## 2020-06-09 NOTE — Telephone Encounter (Signed)
FYI  The hospital called him about his lab results and advised him to come back and they will have a room for him.  He is there now currently waiting on a room.  Advised that he stay this time.  He stated that he will cause it scared him.  He could not remember what results they called about or why he had to go back.

## 2020-06-10 ENCOUNTER — Encounter (HOSPITAL_COMMUNITY): Payer: Self-pay | Admitting: Family Medicine

## 2020-06-10 ENCOUNTER — Emergency Department (HOSPITAL_COMMUNITY): Payer: Medicare Other

## 2020-06-10 ENCOUNTER — Other Ambulatory Visit: Payer: Self-pay

## 2020-06-10 ENCOUNTER — Inpatient Hospital Stay (HOSPITAL_COMMUNITY): Payer: Medicare Other

## 2020-06-10 DIAGNOSIS — I252 Old myocardial infarction: Secondary | ICD-10-CM | POA: Diagnosis not present

## 2020-06-10 DIAGNOSIS — I739 Peripheral vascular disease, unspecified: Secondary | ICD-10-CM

## 2020-06-10 DIAGNOSIS — I340 Nonrheumatic mitral (valve) insufficiency: Secondary | ICD-10-CM | POA: Diagnosis not present

## 2020-06-10 DIAGNOSIS — Z79890 Hormone replacement therapy (postmenopausal): Secondary | ICD-10-CM | POA: Diagnosis not present

## 2020-06-10 DIAGNOSIS — I10 Essential (primary) hypertension: Secondary | ICD-10-CM | POA: Diagnosis present

## 2020-06-10 DIAGNOSIS — I2510 Atherosclerotic heart disease of native coronary artery without angina pectoris: Secondary | ICD-10-CM | POA: Diagnosis not present

## 2020-06-10 DIAGNOSIS — E039 Hypothyroidism, unspecified: Secondary | ICD-10-CM | POA: Diagnosis not present

## 2020-06-10 DIAGNOSIS — I4821 Permanent atrial fibrillation: Secondary | ICD-10-CM | POA: Diagnosis not present

## 2020-06-10 DIAGNOSIS — Z20822 Contact with and (suspected) exposure to covid-19: Secondary | ICD-10-CM | POA: Diagnosis not present

## 2020-06-10 DIAGNOSIS — N390 Urinary tract infection, site not specified: Secondary | ICD-10-CM | POA: Diagnosis not present

## 2020-06-10 DIAGNOSIS — R7881 Bacteremia: Secondary | ICD-10-CM | POA: Diagnosis present

## 2020-06-10 DIAGNOSIS — E118 Type 2 diabetes mellitus with unspecified complications: Secondary | ICD-10-CM

## 2020-06-10 DIAGNOSIS — G4730 Sleep apnea, unspecified: Secondary | ICD-10-CM | POA: Diagnosis present

## 2020-06-10 DIAGNOSIS — E1142 Type 2 diabetes mellitus with diabetic polyneuropathy: Secondary | ICD-10-CM | POA: Diagnosis not present

## 2020-06-10 DIAGNOSIS — E785 Hyperlipidemia, unspecified: Secondary | ICD-10-CM | POA: Diagnosis present

## 2020-06-10 DIAGNOSIS — Z87891 Personal history of nicotine dependence: Secondary | ICD-10-CM | POA: Diagnosis not present

## 2020-06-10 DIAGNOSIS — B351 Tinea unguium: Secondary | ICD-10-CM | POA: Diagnosis present

## 2020-06-10 DIAGNOSIS — Z83438 Family history of other disorder of lipoprotein metabolism and other lipidemia: Secondary | ICD-10-CM | POA: Diagnosis not present

## 2020-06-10 DIAGNOSIS — Z7901 Long term (current) use of anticoagulants: Secondary | ICD-10-CM | POA: Diagnosis not present

## 2020-06-10 DIAGNOSIS — Z79899 Other long term (current) drug therapy: Secondary | ICD-10-CM | POA: Diagnosis not present

## 2020-06-10 DIAGNOSIS — Z91040 Latex allergy status: Secondary | ICD-10-CM | POA: Diagnosis not present

## 2020-06-10 DIAGNOSIS — B9561 Methicillin susceptible Staphylococcus aureus infection as the cause of diseases classified elsewhere: Secondary | ICD-10-CM

## 2020-06-10 DIAGNOSIS — Z7984 Long term (current) use of oral hypoglycemic drugs: Secondary | ICD-10-CM | POA: Diagnosis not present

## 2020-06-10 DIAGNOSIS — N400 Enlarged prostate without lower urinary tract symptoms: Secondary | ICD-10-CM | POA: Diagnosis not present

## 2020-06-10 DIAGNOSIS — E1151 Type 2 diabetes mellitus with diabetic peripheral angiopathy without gangrene: Secondary | ICD-10-CM | POA: Diagnosis present

## 2020-06-10 DIAGNOSIS — Z8249 Family history of ischemic heart disease and other diseases of the circulatory system: Secondary | ICD-10-CM | POA: Diagnosis not present

## 2020-06-10 DIAGNOSIS — D649 Anemia, unspecified: Secondary | ICD-10-CM | POA: Diagnosis not present

## 2020-06-10 DIAGNOSIS — Z951 Presence of aortocoronary bypass graft: Secondary | ICD-10-CM | POA: Diagnosis not present

## 2020-06-10 LAB — LACTIC ACID, PLASMA
Lactic Acid, Venous: 1.3 mmol/L (ref 0.5–1.9)
Lactic Acid, Venous: 1.2 mmol/L (ref 0.5–1.9)

## 2020-06-10 LAB — COMPREHENSIVE METABOLIC PANEL
ALT: 20 U/L (ref 0–44)
AST: 20 U/L (ref 15–41)
Albumin: 3.4 g/dL — ABNORMAL LOW (ref 3.5–5.0)
Alkaline Phosphatase: 58 U/L (ref 38–126)
Anion gap: 9 (ref 5–15)
BUN: 21 mg/dL (ref 8–23)
CO2: 22 mmol/L (ref 22–32)
Calcium: 9 mg/dL (ref 8.9–10.3)
Chloride: 104 mmol/L (ref 98–111)
Creatinine, Ser: 1.06 mg/dL (ref 0.61–1.24)
GFR, Estimated: >60 mL/min (ref >60)
Glucose, Bld: 114 mg/dL — ABNORMAL HIGH (ref 70–99)
Potassium: 3.7 mmol/L (ref 3.5–5.1)
Sodium: 135 mmol/L (ref 135–145)
Total Bilirubin: 1.3 mg/dL — ABNORMAL HIGH (ref 0.3–1.2)
Total Protein: 6.6 g/dL (ref 6.5–8.1)

## 2020-06-10 LAB — CBC WITH DIFFERENTIAL/PLATELET
Abs Immature Granulocytes: 0.1 10*3/uL — ABNORMAL HIGH (ref 0.00–0.07)
Basophils Absolute: 0 10*3/uL (ref 0.0–0.1)
Basophils Relative: 0 %
Eosinophils Absolute: 0.2 10*3/uL (ref 0.0–0.5)
Eosinophils Relative: 1 %
HCT: 35.8 % — ABNORMAL LOW (ref 39.0–52.0)
Hemoglobin: 11.7 g/dL — ABNORMAL LOW (ref 13.0–17.0)
Immature Granulocytes: 1 %
Lymphocytes Relative: 11 %
Lymphs Abs: 1.8 10*3/uL (ref 0.7–4.0)
MCH: 30.3 pg (ref 26.0–34.0)
MCHC: 32.7 g/dL (ref 30.0–36.0)
MCV: 92.7 fL (ref 80.0–100.0)
Monocytes Absolute: 1.3 10*3/uL — ABNORMAL HIGH (ref 0.1–1.0)
Monocytes Relative: 8 %
Neutro Abs: 13.7 10*3/uL — ABNORMAL HIGH (ref 1.7–7.7)
Neutrophils Relative %: 79 %
Platelets: 193 10*3/uL (ref 150–400)
RBC: 3.86 MIL/uL — ABNORMAL LOW (ref 4.22–5.81)
RDW: 14.6 % (ref 11.5–15.5)
WBC: 17.2 10*3/uL — ABNORMAL HIGH (ref 4.0–10.5)
nRBC: 0 % (ref 0.0–0.2)

## 2020-06-10 LAB — GLUCOSE, CAPILLARY
Glucose-Capillary: 142 mg/dL — ABNORMAL HIGH (ref 70–99)
Glucose-Capillary: 135 mg/dL — ABNORMAL HIGH (ref 70–99)

## 2020-06-10 LAB — RESP PANEL BY RT-PCR (FLU A&B, COVID) ARPGX2
Influenza A by PCR: NEGATIVE
Influenza B by PCR: NEGATIVE
SARS Coronavirus 2 by RT PCR: NEGATIVE

## 2020-06-10 MED ORDER — TAMSULOSIN HCL 0.4 MG PO CAPS
0.4000 mg | ORAL_CAPSULE | Freq: Every day | ORAL | Status: DC
  Administered 2020-06-10 – 2020-06-13 (×4): 0.4 mg via ORAL
  Filled 2020-06-10 (×4): qty 1

## 2020-06-10 MED ORDER — LEVOTHYROXINE SODIUM 75 MCG PO TABS
75.0000 ug | ORAL_TABLET | Freq: Every day | ORAL | Status: DC
  Administered 2020-06-10 – 2020-06-13 (×4): 75 ug via ORAL
  Filled 2020-06-10 (×4): qty 1

## 2020-06-10 MED ORDER — LATANOPROST 0.005 % OP SOLN
1.0000 [drp] | Freq: Every day | OPHTHALMIC | Status: DC
  Administered 2020-06-10 – 2020-06-12 (×3): 1 [drp] via OPHTHALMIC
  Filled 2020-06-10 (×2): qty 2.5

## 2020-06-10 MED ORDER — ACETAMINOPHEN 650 MG RE SUPP: 650.0000 mg | Freq: Four times a day (QID) | RECTAL | Status: DC | PRN

## 2020-06-10 MED ORDER — LOSARTAN POTASSIUM 25 MG PO TABS
25.0000 mg | ORAL_TABLET | Freq: Every day | ORAL | Status: DC
Start: 2020-06-10 — End: 2020-06-14
  Administered 2020-06-10 – 2020-06-13 (×4): 25 mg via ORAL
  Filled 2020-06-10 (×4): qty 1

## 2020-06-10 MED ORDER — ONDANSETRON HCL 4 MG PO TABS: 4.0000 mg | ORAL_TABLET | Freq: Four times a day (QID) | ORAL | Status: DC | PRN

## 2020-06-10 MED ORDER — INSULIN ASPART 100 UNIT/ML ~~LOC~~ SOLN
0.0000 [IU] | Freq: Two times a day (BID) | SUBCUTANEOUS | Status: DC
  Administered 2020-06-10: 2 [IU] via SUBCUTANEOUS

## 2020-06-10 MED ORDER — ATENOLOL 25 MG PO TABS
25.0000 mg | ORAL_TABLET | Freq: Every day | ORAL | Status: DC
  Administered 2020-06-10 – 2020-06-13 (×4): 25 mg via ORAL
  Filled 2020-06-10 (×4): qty 1

## 2020-06-10 MED ORDER — ATORVASTATIN CALCIUM 40 MG PO TABS
40.0000 mg | ORAL_TABLET | Freq: Every day | ORAL | Status: DC
Start: 2020-06-10 — End: 2020-06-14
  Administered 2020-06-10 – 2020-06-13 (×4): 40 mg via ORAL
  Filled 2020-06-10 (×4): qty 1

## 2020-06-10 MED ORDER — APIXABAN 5 MG PO TABS
5.0000 mg | ORAL_TABLET | Freq: Two times a day (BID) | ORAL | Status: DC
  Administered 2020-06-10 – 2020-06-13 (×7): 5 mg via ORAL
  Filled 2020-06-10 (×7): qty 1

## 2020-06-10 MED ORDER — ONDANSETRON HCL 4 MG/2ML IJ SOLN: 4.0000 mg | Freq: Four times a day (QID) | INTRAMUSCULAR | Status: DC | PRN

## 2020-06-10 MED ORDER — ACETAMINOPHEN 325 MG PO TABS: 650.0000 mg | ORAL_TABLET | Freq: Four times a day (QID) | ORAL | Status: DC | PRN

## 2020-06-10 MED ORDER — CEFAZOLIN SODIUM-DEXTROSE 2-4 GM/100ML-% IV SOLN
2.0000 g | Freq: Three times a day (TID) | INTRAVENOUS | Status: DC
  Administered 2020-06-10 – 2020-06-13 (×11): 2 g via INTRAVENOUS
  Filled 2020-06-10 (×14): qty 100

## 2020-06-10 NOTE — ED Provider Notes (Signed)
[facility] EMERGENCY DEPARTMENT Provider Note   CSN: [id number] Arrival date & time: 06/09/20  1335     History Chief Complaint  Patient presents with  . Abnormal Lab  . positive blood culture    Harry Brown is a 80 y.o. male.   80 y/o male with hx of CAD, HTN, DM, MI, PVD, hypothyroid presents to the emergency department for further evaluation following positive blood cultures drawn on 06/08/2020.  Went to [facility], ED on that day for generalized weakness and transient confusion.  Had experienced a fever and chills two days prior.  States that his fever has not been known to be recurrent since Friday.  Currently states he feels well.  No cough, chest pain, abdominal pain, vomiting, urinary symptoms.  Has been vaccinated and boosted for COVID.  Advised to come to the ED for admission by ID physician Dr. Gale Journey who reviewed blood culture results; positive for MSSA.        Past Medical History:  Diagnosis Date  . Anemia 09/24/2013  . CAD (coronary artery disease)   . Cellulitis 05/14/2013   RLE  . Diverticulitis   . ED (erectile dysfunction)   . Glaucoma 08/18/2014  . History of sleep apnea    had surgery to correct  . Hypertension   . Hypothyroidism   . Medicare annual wellness visit, subsequent 04/13/2013  . Myocardial infarction ([place]) 11/05/1985  . Nocturia 03/15/2017  . Other and unspecified hyperlipidemia   . Penile lesion 02/24/2015  . Peripheral neuropathy 03/31/2012  . Peripheral vascular disease ([place])   . Postoperative anemia due to acute blood loss 09/24/2013  . Preventative health care 03/13/2016  . Sleep apnea 09/16/2017  . Type II diabetes mellitus ([facility])    fasting 100-120  . Urinary urgency 11/22/2012    Patient Active Problem List   Diagnosis Date Noted  . Paresthesia of right lower extremity 04/03/2020  . Urinary frequency 11/19/2019  . Atrial fibrillation ([place]) 11/19/2019  . Bilateral shoulder pain 10/31/2018  . Educated about  COVID-19 virus infection 10/31/2018  . DM (diabetes mellitus) with complications ([place]) 67/61/9509  . Tremor 05/02/2018  . Sleep apnea 09/16/2017  . Nocturia 03/15/2017  . Hypertension   . Preventative health care 03/13/2016  . Glaucoma 08/18/2014  . Need for viral immunization 09/24/2013  . Anemia 09/24/2013  . Lower back pain 05/14/2013  . Ambulatory dysfunction 05/14/2013  . Benign prostatic hyperplasia 05/14/2013  . Medicare annual wellness visit, subsequent 04/13/2013  . Abdominal aortic aneurysm ([place]) 04/03/2013  . Atherosclerosis of native arteries of extremity with intermittent claudication ([place]) 02/23/2013  . Onychomycosis 11/27/2012  . PVD (peripheral vascular disease) ([place]) 11/27/2012  . Neck pain on left side 11/22/2012  . Peripheral neuropathy 03/31/2012  . Bruit 12/29/2011  . Hypothyroidism 12/16/2011  . Type 2 diabetes mellitus with atherosclerosis of aorta ([place]) 12/16/2011  . Hyperlipidemia 12/16/2011  . Diverticulosis 12/16/2011    Past Surgical History:  Procedure Laterality Date  . ABDOMINAL AORTAGRAM  Oct. 9, 2014   Dr. Bridgett Larsson  . ABDOMINAL AORTAGRAM N/A 03/15/2013   Procedure: ABDOMINAL Maxcine Ham;  Surgeon: Conrad Sanborn, MD;  Location: [facility] CATH LAB;  Service: Cardiovascular;  Laterality: N/A;  . ANTERIOR CERVICAL DECOMP/DISCECTOMY FUSION  ~ 2000  . CARDIOVERSION N/A 12/03/2019   Procedure: CARDIOVERSION;  Surgeon: Acie Fredrickson Wonda Cheng, MD;  Location: [facility];  Service: Cardiovascular;  Laterality: N/A;  . CATARACT EXTRACTION Right   . CORONARY ARTERY BYPASS GRAFT  2001   "  CABG X3" (05/14/2013)  . DENTAL SURGERY     "multiple teeth removed; trmimed top of mouth so dentures would fit" (05/14/2013)  . EYE SURGERY     cataracts  . FEMORAL-POPLITEAL BYPASS GRAFT Right 04/25/2013   Procedure: RIGHT FEMORAL TO ABOVE KNEE POPLITEAL BYPASS GRAFT WITH RIGHT GREATER SAPHENOUS VEIN HARVEST; ULTRASOUND GUIDED;  Surgeon: Conrad Montague, MD;  [facility];  Service:  Vascular;  Laterality: Right;  . GUM SURGERY  [phone]   "had bone scraped; had periodontal disease" (05/14/2013)  . PALATE / UVULA BIOPSY / EXCISION  2003   Removed due to sleep apnea  . TONSILLECTOMY         Family History  Problem Relation Age of Onset  . Hyperlipidemia Mother   . Heart disease Mother   . Diabetes Mother   . Hypertension Mother   . Heart disease Father   . Asthma Father   . Arthritis Father   . Heart disease Sister   . Heart disease Sister        s/p bypass x 2  . Lupus Sister   . Heart disease Brother        MI waiting on heart transplant when died  . Heart disease Brother        massive MI  . Cancer Neg Hx     Social History   Tobacco Use  . Smoking status: Former Smoker    Packs/day: 1.50    Years: 50.00    Pack years: 75.00    Types: Cigarettes  . Smokeless tobacco: Never Used  . Tobacco comment: 05/14/2013 "quit smoking in ~ 2004; still Uses Comit lozenges"  Substance Use Topics  . Alcohol use: No    Alcohol/week: 0.0 standard drinks  . Drug use: No    Home Medications Prior to Admission medications   Medication Sig Start Date End Date Taking? Authorizing Provider  atenolol (TENORMIN) 25 MG tablet TAKE ONE (1) TABLET BY MOUTH EVERY DAY 05/28/20   [name], MD  atorvastatin (LIPITOR) 40 MG tablet TAKE ONE (1) TABLET BY MOUTH EACH DAY AT6PM Patient taking differently: Take 40 mg by mouth daily.  08/30/19   [name], MD  Cholecalciferol (VITAMIN D) 50 MCG (2000 UT) tablet Take 2,000 Units by mouth daily.     [provider]  ELIQUIS 5 MG TABS tablet TAKE ONE (1) TABLET BY MOUTH TWO (2) TIMES DAILY 04/28/20   [name], MD  latanoprost (XALATAN) 0.005 % ophthalmic solution Place 1 drop into both eyes at bedtime.  08/19/14   [provider]  levothyroxine (SYNTHROID) 75 MCG tablet Take 1 tablet (75 mcg total) by mouth daily before breakfast. 04/24/20   [name], MD  loratadine (ALAVERT) 10 MG tablet  Take 10 mg by mouth daily as needed for allergies.    [provider]  losartan (COZAAR) 25 MG tablet TAKE ONE (1) TABLET BY MOUTH EVERY DAY 02/07/20   Sherran Needs, NP  metFORMIN (GLUCOPHAGE-XR) 500 MG 24 hr tablet TAKE TWO (2) TABLETS BY MOUTH EVERY MORNING WITH BREAKFAST 05/23/20   [name], MD  nitroGLYCERIN (NITROSTAT) 0.4 MG SL tablet Place 1 tablet (0.4 mg total) under the tongue every 5 (five) minutes as needed for chest pain. 08/07/18 02/06/20  Revankar, Reita Cliche, MD  ONETOUCH DELICA LANCETS 40J MISC USE TO CHECK BLOOD SUGAR ONCE DAILY 08/02/18   [name], MD  [facility] ULTRA test strip USE 1 STRIP DAILY  12/18/19   [name], MD  tamsulosin (FLOMAX) 0.4 MG CAPS capsule TAKE ONE CAPSULE BY MOUTH DAILY 03/31/20   [name], MD    Allergies    Adhesive [tape], Latex, and Other  Review of Systems   Review of Systems  Ten systems reviewed and are negative for acute change, except as noted in the HPI.    Physical Exam Updated Vital Signs BP 104/71   Pulse 67   Temp 99.3 F (37.4 C) (Oral)   Resp 18   SpO2 97%   Physical Exam Vitals and nursing note reviewed.  Constitutional:      General: He is not in acute distress.    Appearance: He is well-developed and well-nourished. He is not diaphoretic.     Comments: Alert, pleasant. Well appearing.  HENT:     Head: Normocephalic and atraumatic.  Eyes:     General: No scleral icterus.    Extraocular Movements: EOM normal.     Conjunctiva/sclera: Conjunctivae normal.  Cardiovascular:     Rate and Rhythm: Normal rate and regular rhythm.     Pulses: Normal pulses.  Pulmonary:     Effort: Pulmonary effort is normal. No respiratory distress.     Breath sounds: No stridor. No wheezing.     Comments: Respirations even and unlabored Musculoskeletal:        General: Normal range of motion.     Cervical back: Normal range of motion.  Skin:    General: Skin is warm and dry.     Coloration: Skin is not  pale.     Findings: No erythema or rash.  Neurological:     General: No focal deficit present.     Mental Status: He is alert and oriented to person, place, and time.     Coordination: Coordination normal.  Psychiatric:        Mood and Affect: Mood and affect normal.        Behavior: Behavior normal.     ED Results / Procedures / Treatments   Labs (all labs ordered are listed, but only abnormal results are displayed) Labs Reviewed  CBC WITH DIFFERENTIAL/PLATELET - Abnormal; Notable for the following components:      Result Value   WBC 17.2 (*)    RBC 3.86 (*)    Hemoglobin 11.7 (*)    HCT 35.8 (*)    Neutro Abs 13.7 (*)    Monocytes Absolute 1.3 (*)    Abs Immature Granulocytes 0.10 (*)    All other components within normal limits  COMPREHENSIVE METABOLIC PANEL - Abnormal; Notable for the following components:   Glucose, Bld 114 (*)    Albumin 3.4 (*)    Total Bilirubin 1.3 (*)    All other components within normal limits  CULTURE, BLOOD (ROUTINE X 2)  CULTURE, BLOOD (ROUTINE X 2)  URINE CULTURE  RESP PANEL BY RT-PCR (FLU A&B, COVID) ARPGX2  LACTIC ACID, PLASMA  LACTIC ACID, PLASMA  URINALYSIS, ROUTINE W REFLEX MICROSCOPIC    EKG None  Radiology DG Chest 2 View  Result Date: 06/08/2020 CLINICAL DATA:  Suspected sepsis EXAM: CHEST - 2 VIEW COMPARISON:  CT a chest 11/10/2019 FINDINGS: Cardiac and mediastinal contours are within normal limits. Patient is status post median sternotomy with evidence of multivessel CABG. Trace atherosclerotic calcifications present in the transverse aorta. No focal airspace opacity to suggest the presence of pneumonia. Minimal scarring at the right costophrenic angle. No pleural effusion or pneumothorax. No acute osseous abnormality.  IMPRESSION: No active cardiopulmonary disease. Electronically Signed   By: Jacqulynn Cadet M.D.   On: 06/08/2020 12:48   DG Chest Portable 1 View  Result Date: 06/10/2020 CLINICAL DATA:  Bacteremia EXAM:  PORTABLE CHEST 1 VIEW COMPARISON:  Two days ago FINDINGS: Normal heart size and stable mediastinal contours.  CABG. Prominent infrahilar markings but similar to previous studies. No Kerley lines, effusion, or pneumothorax. IMPRESSION: Stable compared to priors.  No convincing pneumonia. Electronically Signed   By: Monte Fantasia M.D.   On: 06/10/2020 04:16    Procedures .Critical Care Performed by: Antonietta Breach, PA-C Authorized by: Antonietta Breach, PA-C   Critical care provider statement:    Critical care time (minutes):  45   Critical care was necessary to treat or prevent imminent or life-threatening deterioration of the following conditions: bacteremia.   Critical care was time spent personally by me on the following activities:  Discussions with consultants, evaluation of patient's response to treatment, examination of patient, ordering and performing treatments and interventions, ordering and review of laboratory studies, ordering and review of radiographic studies, pulse oximetry, re-evaluation of patient's condition, obtaining history from patient or surrogate and review of old charts   (including critical care time)  Medications Ordered in ED Medications  ceFAZolin (ANCEF) IVPB 2g/100 mL premix (0 g Intravenous Stopped 06/10/20 0514)    ED Course  I have reviewed the triage vital signs and the nursing notes.  Pertinent labs & imaging results that were available during my care of the patient were reviewed by me and considered in my medical decision making (see chart for details).  Clinical Course as of 06/10/20 0537  Tue Jun 10, 2020  0534 Dr. Cyd Silence of [facility] to admit [KH]    Clinical Course User Index [KH] Beverely Pace   MDM Rules/Calculators/A&P                          80 year old male advised to return to the ED for admission after blood cultures drawn on 06/08/2020 resulted positive for MSSA.  Patient presently afebrile with stable, reassuring vital signs.  Overall,  states he feels well.  His leukocytosis is downtrending.  Started on Ancef based on blood culture results.  No clear source of bacteremia at this time.  Will be admitted by hospitalist service for continued management.   Final Clinical Impression(s) / ED Diagnoses Final diagnoses:  Bacteremia due to Staphylococcus aureus    Rx / DC Orders ED Discharge Orders    None       Antonietta Breach, PA-C 06/10/20 9741    Merryl Hacker, MD 06/11/20 [phone]

## 2020-06-10 NOTE — Consult Note (Signed)
[facility]    Date of Admission:  06/09/2020     Reason for Consult: MSSA bacteremia     Referring Physician: Dr. Loleta Books  Current antibiotics: Day 2 cefazolin (06/09/2020--present)  ASSESSMENT:    #MSSA bacteremia of unclear source #Peripheral vascular disease status post femoropopliteal bypass graft #Coronary artery disease status post CABG #Diabetes #Atrial fibrillation on anticoagulation  PLAN:    --Continue cefazolin --TTE --Follow-up blood culture results --Will follow --Glycemic control   MEDICATIONS:    Scheduled Meds:   Continuous Infusions: .  ceFAZolin (ANCEF) IV Stopped (06/10/20 0514)    PRN Meds: acetaminophen **OR** acetaminophen, ondansetron **OR** ondansetron (ZOFRAN) IV  HPI:    Harry Brown is a 80 y.o. male with past medical history of coronary artery disease status post CABG, atrial fibrillation on anticoagulation, peripheral vascular disease status post right femoropopliteal bypass graft (2014), abdominal aortic aneurysm, hypertension, and diabetes who presents back to the [facility] emergency room after blood cultures from a recent ER visit were positive for MSSA.  Patient was in his normal state of health until approximately 3 days prior to admission when he awoke with high fevers, malaise, shaking chills, and weakness.  He was brought to the emergency department where blood cultures were drawn and lab work-up was initiated.  This was notable for WBC of 26.2, normal lactic acid, no significant findings on CMP.  Blood cultures from January 2 are positive for staph aureus with no methicillin resistance detected on BC ID.  He reports feeling fine in the day following his ER visit, however, returned after learning of the positive blood culture result.  He has been started on cefazolin.  Repeat blood cultures have been drawn.  He has had no recent surgeries or lower extremity wounds.  He has no prosthetic joints and reports no  new joint pain.  He does have a history of bypass graft in 2014.  He reports some itching of his legs over the weekend and was scratching them.  He also has a history of prior neck surgery with fusion, however, reports no hardware in place.   Past Medical History:  Diagnosis Date  . Anemia 09/24/2013  . CAD (coronary artery disease)   . Cellulitis 05/14/2013   RLE  . Diverticulitis   . ED (erectile dysfunction)   . Glaucoma 08/18/2014  . History of sleep apnea    had surgery to correct  . Hypertension   . Hypothyroidism   . Medicare annual wellness visit, subsequent 04/13/2013  . Myocardial infarction ([place]) 11/05/1985  . Nocturia 03/15/2017  . Other and unspecified hyperlipidemia   . Penile lesion 02/24/2015  . Peripheral neuropathy 03/31/2012  . Peripheral vascular disease ([place])   . Postoperative anemia due to acute blood loss 09/24/2013  . Preventative health care 03/13/2016  . Sleep apnea 09/16/2017  . Type II diabetes mellitus ([facility])    fasting 100-120  . Urinary urgency 11/22/2012    Social History   Tobacco Use  . Smoking status: Former Smoker    Packs/day: 1.50    Years: 50.00    Pack years: 75.00    Types: Cigarettes  . Smokeless tobacco: Never Used  . Tobacco comment: 05/14/2013 "quit smoking in ~ 2004; still Uses Comit lozenges"  Substance Use Topics  . Alcohol use: No    Alcohol/week: 0.0 standard drinks  . Drug use: No    Family History  Problem Relation Age of Onset  . Hyperlipidemia Mother   .  Heart disease Mother   . Diabetes Mother   . Hypertension Mother   . Heart disease Father   . Asthma Father   . Arthritis Father   . Heart disease Sister   . Heart disease Sister        s/p bypass x 2  . Lupus Sister   . Heart disease Brother        MI waiting on heart transplant when died  . Heart disease Brother        massive MI  . Cancer Neg Hx     Allergies  Allergen Reactions  . Adhesive [Tape] Rash  . Latex Rash  . Other Hives, Swelling and Rash     Plastic shopping bags Metal staples: rash, swelling    Review of Systems  Constitutional: Positive for chills, fever and malaise/fatigue.  Respiratory: Negative for cough and shortness of breath.   Cardiovascular: Negative for chest pain and leg swelling.  Gastrointestinal: Negative.   Musculoskeletal: Negative for back pain and joint pain.  Skin: Positive for itching and rash.  Neurological: Negative.   Psychiatric/Behavioral: Negative.   All other systems reviewed and are negative.   OBJECTIVE:   Blood pressure 131/70, pulse (!) 57, temperature 99 F (37.2 C), temperature source Oral, resp. rate 18, SpO2 97 %. There is no height or weight on file to calculate BMI.  Physical Exam Constitutional:      General: He is not in acute distress.    Appearance: Normal appearance.  HENT:     Head: Normocephalic and atraumatic.  Eyes:     General: No scleral icterus.    Extraocular Movements: Extraocular movements intact.     Conjunctiva/sclera: Conjunctivae normal.  Cardiovascular:     Rate and Rhythm: Normal rate and regular rhythm.     Heart sounds: No murmur heard.   Pulmonary:     Effort: Pulmonary effort is normal. No respiratory distress.     Breath sounds: Normal breath sounds.  Skin:    General: Skin is warm and dry.     Comments: Bilateral onychomycosis and dry feet. Left lower extremity with mild excoriations.  Neurological:     General: No focal deficit present.     Mental Status: He is alert and oriented to person, place, and time.  Psychiatric:        Mood and Affect: Mood normal.        Behavior: Behavior normal.     Lab Results & Microbiology Lab Results  Component Value Date   WBC 17.2 (H) 06/10/2020   HGB 11.7 (L) 06/10/2020   HCT 35.8 (L) 06/10/2020   MCV 92.7 06/10/2020   PLT 193 06/10/2020    Lab Results  Component Value Date   NA 135 06/10/2020   K 3.7 06/10/2020   CO2 22 06/10/2020   GLUCOSE 114 (H) 06/10/2020   BUN 21 06/10/2020    CREATININE 1.06 06/10/2020   CALCIUM 9.0 06/10/2020   GFRNONAA >60 06/10/2020   GFRAA >60 11/22/2019    Lab Results  Component Value Date   ALT 20 06/10/2020   AST 20 06/10/2020   ALKPHOS 58 06/10/2020   BILITOT 1.3 (H) 06/10/2020    C-Reactive Protein  No results found for: CRP  Erythrocyte Sedimentation Rate  No results found for: ESRSEDRATE    I have reviewed the micro and lab results in Epic.  Imaging DG Chest 2 View  Result Date: 06/08/2020 CLINICAL DATA:  Suspected sepsis EXAM: CHEST - 2 VIEW COMPARISON:  CT a chest 11/10/2019 FINDINGS: Cardiac and mediastinal contours are within normal limits. Patient is status post median sternotomy with evidence of multivessel CABG. Trace atherosclerotic calcifications present in the transverse aorta. No focal airspace opacity to suggest the presence of pneumonia. Minimal scarring at the right costophrenic angle. No pleural effusion or pneumothorax. No acute osseous abnormality. IMPRESSION: No active cardiopulmonary disease. Electronically Signed   By: Jacqulynn Cadet M.D.   On: 06/08/2020 12:48   DG Chest Portable 1 View  Result Date: 06/10/2020 CLINICAL DATA:  Bacteremia EXAM: PORTABLE CHEST 1 VIEW COMPARISON:  Two days ago FINDINGS: Normal heart size and stable mediastinal contours.  CABG. Prominent infrahilar markings but similar to previous studies. No Kerley lines, effusion, or pneumothorax. IMPRESSION: Stable compared to priors.  No convincing pneumonia. Electronically Signed   By: Monte Fantasia M.D.   On: 06/10/2020 04:16      [facility] [phone] pager 06/10/2020, 8:47 AM

## 2020-06-10 NOTE — H&P (Signed)
History and Physical  Patient Name: Harry Brown     HUD:[id number]    DOB: December 24, 1940    DOA: 06/09/2020 PCP: [name], MD  Patient coming from: Home  Chief Complaint: Fever then abnormal blood culture      HPI: Harry Brown is a 80 y.o. M with hx CAD s/p CABG, Afib on Eliquis, PVD s/p R fem-pop, HTN, DM, and hypothyroidism who presented with fever and malaise for 2 days, then abnormal blood culture.  Patient was in his normal state of health until 3 days prior to admission when he woke with fever to 102, malaise, chills, rigors, significant weakness and inability to stand.  Family brought him to the ER, where blood cultures were drawn, but the rest of his exam, vital signs, and laboratory studies were reassuring and so he was discharged home.  In the last 24 hours, his blood pressure is turned positive for MSSA and so he was called and recommended to return to the ER.  In the ER, he remained afebrile with normal heart rate and blood pressure.  He was started on cefazolin, and the hospitalist service were asked to evaluate for staph bacteremia           ROS: Review of Systems  Constitutional: Positive for chills, fever and malaise/fatigue.  Genitourinary: Positive for frequency and urgency. Negative for dysuria, flank pain and hematuria.  Musculoskeletal: Positive for joint pain.  Neurological: Positive for weakness. Negative for focal weakness.  All other systems reviewed and are negative.         Past Medical History:  Diagnosis Date  . Anemia 09/24/2013  . CAD (coronary artery disease)   . Cellulitis 05/14/2013   RLE  . Diverticulitis   . ED (erectile dysfunction)   . Glaucoma 08/18/2014  . History of sleep apnea    had surgery to correct  . Hypertension   . Hypothyroidism   . Medicare annual wellness visit, subsequent 04/13/2013  . Myocardial infarction ([place]) 11/05/1985  . Nocturia 03/15/2017  . Other and unspecified hyperlipidemia   . Penile lesion  02/24/2015  . Peripheral neuropathy 03/31/2012  . Peripheral vascular disease ([place])   . Postoperative anemia due to acute blood loss 09/24/2013  . Preventative health care 03/13/2016  . Sleep apnea 09/16/2017  . Type II diabetes mellitus ([facility])    fasting 100-120  . Urinary urgency 11/22/2012    Past Surgical History:  Procedure Laterality Date  . ABDOMINAL AORTAGRAM  Oct. 9, 2014   Dr. Bridgett Larsson  . ABDOMINAL AORTAGRAM N/A 03/15/2013   Procedure: ABDOMINAL Maxcine Ham;  Surgeon: Conrad Cotati, MD;  Location: [facility] CATH LAB;  Service: Cardiovascular;  Laterality: N/A;  . ANTERIOR CERVICAL DECOMP/DISCECTOMY FUSION  ~ 2000  . CARDIOVERSION N/A 12/03/2019   Procedure: CARDIOVERSION;  Surgeon: Acie Fredrickson Wonda Cheng, MD;  Location: [facility];  Service: Cardiovascular;  Laterality: N/A;  . CATARACT EXTRACTION Right   . CORONARY ARTERY BYPASS GRAFT  2001   "CABG X3" (05/14/2013)  . DENTAL SURGERY     "multiple teeth removed; trmimed top of mouth so dentures would fit" (05/14/2013)  . EYE SURGERY     cataracts  . FEMORAL-POPLITEAL BYPASS GRAFT Right 04/25/2013   Procedure: RIGHT FEMORAL TO ABOVE KNEE POPLITEAL BYPASS GRAFT WITH RIGHT GREATER SAPHENOUS VEIN HARVEST; ULTRASOUND GUIDED;  Surgeon: Conrad Maggie Valley, MD;  Location: [facility] OR;  Service: Vascular;  Laterality: Right;  . GUM SURGERY  [phone]   "had bone scraped; had periodontal disease" (05/14/2013)  .  PALATE / UVULA BIOPSY / EXCISION  2003   Removed due to sleep apnea  . TONSILLECTOMY      Social History: Patient lives with his wife.  The patient walks unassisted.  Remote former smoker. Retired from [employer] under[employer].  Allergies  Allergen Reactions  . Adhesive [Tape] Rash  . Latex Rash  . Other Hives, Swelling and Rash    Plastic shopping bags Metal staples: rash, swelling    Family history: family history includes Arthritis in his father; Asthma in his father; Diabetes in his mother; Heart disease in his brother, brother, father, mother, sister, and sister;  Hyperlipidemia in his mother; Hypertension in his mother; Lupus in his sister.  Prior to Admission medications   Medication Sig Start Date End Date Taking? Authorizing Provider  atenolol (TENORMIN) 25 MG tablet TAKE ONE (1) TABLET BY MOUTH EVERY DAY Patient taking differently: Take 25 mg by mouth daily. 05/28/20  Yes [name], MD  atorvastatin (LIPITOR) 40 MG tablet TAKE ONE (1) TABLET BY MOUTH EACH DAY AT6PM Patient taking differently: Take 40 mg by mouth daily. 08/30/19  Yes [name], MD  Cholecalciferol (VITAMIN D) 50 MCG (2000 UT) tablet Take 2,000 Units by mouth daily.    Yes [provider]  ELIQUIS 5 MG TABS tablet TAKE ONE (1) TABLET BY MOUTH TWO (2) TIMES DAILY Patient taking differently: Take 5 mg by mouth 2 (two) times daily. 04/28/20  Yes [name], MD  latanoprost (XALATAN) 0.005 % ophthalmic solution Place 1 drop into both eyes at bedtime.  08/19/14  Yes [provider]  levothyroxine (SYNTHROID) 75 MCG tablet Take 1 tablet (75 mcg total) by mouth daily before breakfast. 04/24/20  Yes [name], MD  loratadine (CLARITIN) 10 MG tablet Take 10 mg by mouth daily as needed for allergies.   Yes [provider]  losartan (COZAAR) 25 MG tablet TAKE ONE (1) TABLET BY MOUTH EVERY DAY Patient taking differently: Take 25 mg by mouth daily. 02/07/20  Yes Sherran Needs, NP  metFORMIN (GLUCOPHAGE-XR) 500 MG 24 hr tablet TAKE TWO (2) TABLETS BY MOUTH EVERY MORNING WITH BREAKFAST Patient taking differently: Take 1,000 mg by mouth in the morning and at bedtime. 05/23/20  Yes [name], MD  nitroGLYCERIN (NITROSTAT) 0.4 MG SL tablet Place 1 tablet (0.4 mg total) under the tongue every 5 (five) minutes as needed for chest pain. 08/07/18 02/06/20 Yes Revankar, Reita Cliche, MD  tamsulosin (FLOMAX) 0.4 MG CAPS capsule TAKE ONE CAPSULE BY MOUTH DAILY Patient taking differently: Take 0.4 mg by mouth daily. 03/31/20  Yes [name], MD  [facility] DELICA  LANCETS 90Z MISC USE TO CHECK BLOOD SUGAR ONCE DAILY 08/02/18   [name], MD  [facility] ULTRA test strip USE 1 STRIP DAILY 12/18/19   [name], MD       Physical Exam: BP 131/70   Pulse (!) 57   Temp 99 F (37.2 C) (Oral)   Resp 18   SpO2 97%  General appearance: Well-developed, elderly adult male, alert and in no acute distress, sitting in bed, eating breakfast, wearing [employer] facemask.   Eyes: Anicteric, conjunctiva pink, lids and lashes normal. PERRL.    ENT: No nasal deformity, discharge, epistaxis.  Hearing normal. OP moist without lesions.  Dentures in place, lips normal. Neck: No neck masses.  Trachea midline.  No thyromegaly/tenderness. Lymph: No cervical or supraclavicular lymphadenopathy. Skin: Warm and dry.  No jaundice.  No suspicious rashes or lesions.  Cardiac: RRR, nl S1-S2, no murmurs appreciated.  Capillary refill is brisk.  JVP normal.  No LE edema.  Radial pulses 2+ and symmetric. Respiratory: Normal respiratory rate and rhythm.  CTAB without rales or wheezes. Abdomen: Abdomen soft.  No TTP or guarding. No ascites, distension, hepatosplenomegaly.   MSK: No deformities or effusions of the large joints of the upper or lower extremities bilaterally.  No cyanosis or clubbing. Neuro: Cranial nerves normal.  Sensation intact to light touch. Speech is fluent.  Muscle strength 5/5 and symmetric.    Psych: Sensorium intact and responding to questions, attention normal.  Behavior appropriate.  Affect normal.  Judgment and insight appear normal.     Labs on Admission:  I have personally reviewed following labs and imaging studies: CBC: Recent Labs  Lab 06/08/20 1343 06/10/20 0428  WBC 26.2* 17.2*  NEUTROABS 23.2* 13.7*  HGB 12.5* 11.7*  HCT 38.3* 35.8*  MCV 93.9 92.7  PLT 190 627   Basic Metabolic Panel: Recent Labs  Lab 06/08/20 1343 06/10/20 0428  NA 138 135  K 4.0 3.7  CL 104 104  CO2 23 22  GLUCOSE 121* 114*  BUN 30* 21  CREATININE 1.18 1.06   CALCIUM 9.3 9.0   GFR: Estimated Creatinine Clearance: 61 mL/min (by C-G formula based on SCr of 1.06 mg/dL).  Liver Function Tests: Recent Labs  Lab 06/08/20 1343 06/10/20 0428  AST 21 20  ALT 21 20  ALKPHOS 66 58  BILITOT 1.3* 1.3*  PROT 7.5 6.6  ALBUMIN 4.2 3.4*   No results for input(s): LIPASE, AMYLASE in the last 168 hours. No results for input(s): AMMONIA in the last 168 hours. Coagulation Profile: Recent Labs  Lab 06/08/20 1343  INR 1.5*       Recent Results (from the past 240 hour(s))  Culture, blood (Routine x 2)     Status: Abnormal (Preliminary result)   Collection Time: 06/08/20  1:42 PM   Specimen: BLOOD  Result Value Ref Range Status   Specimen Description   Final    BLOOD RIGHT ANTECUBITAL Performed at [facility] [address], [place], [place] [address]    Special Requests   Final    BOTTLES DRAWN AEROBIC AND ANAEROBIC Blood Culture adequate volume Performed at [facility] [address] [place], [place] [address]    Culture  Setup Time   Final    GRAM POSITIVE COCCI IN CLUSTERS AEROBIC BOTTLE ONLY CRITICAL RESULT CALLED TO, READ BACK BY AND VERIFIED WITH: H. Hopkins 11:40 06/09/20 ([email or web address])    Culture (A)  Final    STAPHYLOCOCCUS AUREUS SUSCEPTIBILITIES TO FOLLOW Performed at [facility], [place] [address] [place], [place] [address]    Report Status PENDING  Incomplete  Blood Culture ID Panel (Reflexed)     Status: Abnormal   Collection Time: 06/08/20  1:42 PM  Result Value Ref Range Status   Enterococcus faecalis NOT DETECTED NOT DETECTED Final   Enterococcus Faecium NOT DETECTED NOT DETECTED Final   Listeria monocytogenes NOT DETECTED NOT DETECTED Final   Staphylococcus species DETECTED (A) NOT DETECTED Final    Comment: CRITICAL RESULT CALLED TO, READ BACK BY AND VERIFIED WITH: H. Hopkins 11:40 06/09/20 ([email or web address])    Staphylococcus aureus (BCID) DETECTED (A) NOT DETECTED Final    Comment:  CRITICAL RESULT CALLED TO, READ BACK BY AND VERIFIED WITH: H. Hopkins 11:40 06/09/20 ([email or web address])    Staphylococcus epidermidis NOT DETECTED NOT DETECTED Final   Staphylococcus lugdunensis  NOT DETECTED NOT DETECTED Final   Streptococcus species NOT DETECTED NOT DETECTED Final   Streptococcus agalactiae NOT DETECTED NOT DETECTED Final   Streptococcus pneumoniae NOT DETECTED NOT DETECTED Final   Streptococcus pyogenes NOT DETECTED NOT DETECTED Final   A.calcoaceticus-baumannii NOT DETECTED NOT DETECTED Final   Bacteroides fragilis NOT DETECTED NOT DETECTED Final   Enterobacterales NOT DETECTED NOT DETECTED Final   Enterobacter cloacae complex NOT DETECTED NOT DETECTED Final   Escherichia coli NOT DETECTED NOT DETECTED Final   Klebsiella aerogenes NOT DETECTED NOT DETECTED Final   Klebsiella oxytoca NOT DETECTED NOT DETECTED Final   Klebsiella pneumoniae NOT DETECTED NOT DETECTED Final   Proteus species NOT DETECTED NOT DETECTED Final   Salmonella species NOT DETECTED NOT DETECTED Final   Serratia marcescens NOT DETECTED NOT DETECTED Final   Haemophilus influenzae NOT DETECTED NOT DETECTED Final   Neisseria meningitidis NOT DETECTED NOT DETECTED Final   Pseudomonas aeruginosa NOT DETECTED NOT DETECTED Final   Stenotrophomonas maltophilia NOT DETECTED NOT DETECTED Final   Candida albicans NOT DETECTED NOT DETECTED Final   Candida auris NOT DETECTED NOT DETECTED Final   Candida glabrata NOT DETECTED NOT DETECTED Final   Candida krusei NOT DETECTED NOT DETECTED Final   Candida parapsilosis NOT DETECTED NOT DETECTED Final   Candida tropicalis NOT DETECTED NOT DETECTED Final   Cryptococcus neoformans/gattii NOT DETECTED NOT DETECTED Final   Meth resistant mecA/C and MREJ NOT DETECTED NOT DETECTED Final    Comment: Performed at [facility], [address], [place], [place] [address]  Resp Panel by RT-PCR (Flu A&B, Covid) Nasopharyngeal Swab     Status: None   Collection Time: 06/10/20   5:30 AM   Specimen: Nasopharyngeal Swab; Nasopharyngeal(NP) swabs in vial transport medium  Result Value Ref Range Status   SARS Coronavirus 2 by RT PCR NEGATIVE NEGATIVE Final    Comment: (NOTE) SARS-CoV-2 target nucleic acids are NOT DETECTED.  The SARS-CoV-2 RNA is generally detectable in upper respiratory specimens during the acute phase of infection. The lowest concentration of SARS-CoV-2 viral copies this assay can detect is 138 copies/mL. A negative result does not preclude SARS-Cov-2 infection and should not be used as the sole basis for treatment or other patient management decisions. A negative result may occur with  improper specimen collection/handling, submission of specimen other than nasopharyngeal swab, presence of viral mutation(s) within the areas targeted by this assay, and inadequate number of viral copies(<138 copies/mL). A negative result must be combined with clinical observations, patient history, and epidemiological information. The expected result is Negative.  Fact Sheet for Patients:  EntrepreneurPulse.com.au  Fact Sheet for Healthcare Providers:  IncredibleEmployment.be  This test is no t yet approved or cleared by the [place] FDA and  has been authorized for detection and/or diagnosis of SARS-CoV-2 by FDA under an Emergency Use Authorization (EUA). This EUA will remain  in effect (meaning this test can be used) for the duration of the COVID-19 declaration under Section 564(b)(1) of the Act, 21 U.S.C.section 360bbb-3(b)(1), unless the authorization is terminated  or revoked sooner.       Influenza A by PCR NEGATIVE NEGATIVE Final   Influenza B by PCR NEGATIVE NEGATIVE Final    Comment: (NOTE) The Xpert Xpress SARS-CoV-2/FLU/RSV plus assay is intended as an aid in the diagnosis of influenza from Nasopharyngeal swab specimens and should not be used as a sole basis for treatment. Nasal washings and aspirates  are unacceptable for Xpert Xpress SARS-CoV-2/FLU/RSV testing.  Fact  Sheet for Patients: EntrepreneurPulse.com.au  Fact Sheet for Healthcare Providers: IncredibleEmployment.be  This test is not yet approved or cleared by the [place] FDA and has been authorized for detection and/or diagnosis of SARS-CoV-2 by FDA under an Emergency Use Authorization (EUA). This EUA will remain in effect (meaning this test can be used) for the duration of the COVID-19 declaration under Section 564(b)(1) of the Act, 21 U.S.C. section 360bbb-3(b)(1), unless the authorization is terminated or revoked.  Performed at [facility], [place] [address], [place], [place] [address]            Radiological Exams on Admission: Personally reviewed CXR from today, poor quality AP portable; 2V from other day shows no effusion, cardiomegaly, opacity: DG Chest 2 View  Result Date: 06/08/2020 CLINICAL DATA:  Suspected sepsis EXAM: CHEST - 2 VIEW COMPARISON:  CT a chest 11/10/2019 FINDINGS: Cardiac and mediastinal contours are within normal limits. Patient is status post median sternotomy with evidence of multivessel CABG. Trace atherosclerotic calcifications present in the transverse aorta. No focal airspace opacity to suggest the presence of pneumonia. Minimal scarring at the right costophrenic angle. No pleural effusion or pneumothorax. No acute osseous abnormality. IMPRESSION: No active cardiopulmonary disease. Electronically Signed   By: Jacqulynn Cadet M.D.   On: 06/08/2020 12:48   DG Chest Portable 1 View  Result Date: 06/10/2020 CLINICAL DATA:  Bacteremia EXAM: PORTABLE CHEST 1 VIEW COMPARISON:  Two days ago FINDINGS: Normal heart size and stable mediastinal contours.  CABG. Prominent infrahilar markings but similar to previous studies. No Kerley lines, effusion, or pneumothorax. IMPRESSION: Stable compared to priors.  No convincing pneumonia. Electronically Signed   By:  Monte Fantasia M.D.   On: 06/10/2020 04:16    EKG: Independently reviewed. ECG from 2 days ago showed Afib, no ST change, controlled rate.       Assessment/Plan   MSSA bacteremia Patient without indwelling devices No metastases on my exam  -Consult ID  -Continue cefazolin -Follow initial blood culture sensitivities (it looks like there is only 1?) -Repeat cultures obtained  -Echo pending      Urinary symptoms Patient describes urinary incontinence, urinary urgency in last few days, new.  UA two days ago showed some pyuria, very mild. -Follow urine culture  Coronary artery disease Hypertension Peripheral vascular disease -Continue atenolol, atorvastatin  Diabetes, well controlled, with peripheral neuropathy Glucoses here really good -Hold home metformin -BID AC checks and SSI for now => escalate if hyperglycemia  Permanent atrial fibrillation -Continue atenolol, Eliquis  Hypothyroidism -Continue levothyorinxe  BPH -Continue Flomax       DVT prophylaxis: N/A on apixaban  Code Status: FULL  Family Communication: Wife at bedside  Disposition Plan: Anticipate IV antibiotics, ID consult.  If TTE and TEE negative, then PICC and home to complete Ancef Consults called: ID Admission status: INPATIENT      Medical decision making: Patient seen at 9:31 AM on 06/10/2020.  What exists of the patient's chart was reviewed in depth and summarized above.  Clinical condition: stable.        [facility] Please page though [place] or Epic secure chat:  For password, contact charge nurse

## 2020-06-10 NOTE — Progress Notes (Signed)
Echo was attempted at 2:30pm but unable to get to patient due to toileting. Will re-try tomorrow

## 2020-06-10 NOTE — Progress Notes (Addendum)
Pharmacy Antibiotic Note  Harry Brown is a 80 y.o. male admitted on 06/09/2020 with MSSA bacteremia.  Pharmacy has been consulted for Cefazolin dosing. WBC is markedly elevated. Renal function good. Tmax 99.3. Had some fevers at home.   Plan: Cefazolin 2g IV q8h Trend WBC, temp, renal function  F/U infectious work-up  Temp (24hrs), Avg:98.8 F (37.1 C), Min:98.5 F (36.9 C), Max:99.3 F (37.4 C)  Recent Labs  Lab 06/08/20 1343  WBC 26.2*  CREATININE 1.18  LATICACIDVEN 1.5    Estimated Creatinine Clearance: 54.8 mL/min (by C-G formula based on SCr of 1.18 mg/dL).    Allergies  Allergen Reactions  . Adhesive [Tape] Rash  . Latex Rash  . Other Hives, Swelling and Rash    Plastic shopping bags Metal staples: rash, swelling    Narda Bonds, PharmD, BCPS Clinical Pharmacist Phone: [phone]

## 2020-06-11 ENCOUNTER — Inpatient Hospital Stay (HOSPITAL_COMMUNITY): Payer: Medicare Other

## 2020-06-11 DIAGNOSIS — E118 Type 2 diabetes mellitus with unspecified complications: Secondary | ICD-10-CM | POA: Diagnosis not present

## 2020-06-11 DIAGNOSIS — I739 Peripheral vascular disease, unspecified: Secondary | ICD-10-CM | POA: Diagnosis not present

## 2020-06-11 DIAGNOSIS — I4821 Permanent atrial fibrillation: Secondary | ICD-10-CM | POA: Diagnosis not present

## 2020-06-11 DIAGNOSIS — G4730 Sleep apnea, unspecified: Secondary | ICD-10-CM

## 2020-06-11 DIAGNOSIS — I700 Atherosclerosis of aorta: Secondary | ICD-10-CM

## 2020-06-11 DIAGNOSIS — D649 Anemia, unspecified: Secondary | ICD-10-CM

## 2020-06-11 DIAGNOSIS — R7881 Bacteremia: Secondary | ICD-10-CM

## 2020-06-11 DIAGNOSIS — E1151 Type 2 diabetes mellitus with diabetic peripheral angiopathy without gangrene: Secondary | ICD-10-CM

## 2020-06-11 DIAGNOSIS — I10 Essential (primary) hypertension: Secondary | ICD-10-CM

## 2020-06-11 LAB — CBC
HCT: 32.2 % — ABNORMAL LOW (ref 39.0–52.0)
Hemoglobin: 11 g/dL — ABNORMAL LOW (ref 13.0–17.0)
MCH: 30.8 pg (ref 26.0–34.0)
MCHC: 34.2 g/dL (ref 30.0–36.0)
MCV: 90.2 fL (ref 80.0–100.0)
Platelets: 190 10*3/uL (ref 150–400)
RBC: 3.57 MIL/uL — ABNORMAL LOW (ref 4.22–5.81)
RDW: 14.3 % (ref 11.5–15.5)
WBC: 12.9 10*3/uL — ABNORMAL HIGH (ref 4.0–10.5)
nRBC: 0 % (ref 0.0–0.2)

## 2020-06-11 LAB — CULTURE, BLOOD (ROUTINE X 2): Special Requests: ADEQUATE

## 2020-06-11 LAB — ECHOCARDIOGRAM COMPLETE: S' Lateral: 3.6 cm

## 2020-06-11 LAB — BASIC METABOLIC PANEL
Anion gap: 11 (ref 5–15)
BUN: 20 mg/dL (ref 8–23)
CO2: 23 mmol/L (ref 22–32)
Calcium: 8.8 mg/dL — ABNORMAL LOW (ref 8.9–10.3)
Chloride: 104 mmol/L (ref 98–111)
Creatinine, Ser: 1.01 mg/dL (ref 0.61–1.24)
GFR, Estimated: >60 mL/min (ref >60)
Glucose, Bld: 130 mg/dL — ABNORMAL HIGH (ref 70–99)
Potassium: 3.6 mmol/L (ref 3.5–5.1)
Sodium: 138 mmol/L (ref 135–145)

## 2020-06-11 LAB — GLUCOSE, CAPILLARY
Glucose-Capillary: 120 mg/dL — ABNORMAL HIGH (ref 70–99)
Glucose-Capillary: 104 mg/dL — ABNORMAL HIGH (ref 70–99)
Glucose-Capillary: 110 mg/dL — ABNORMAL HIGH (ref 70–99)

## 2020-06-11 NOTE — Discharge Instructions (Signed)

## 2020-06-11 NOTE — Telephone Encounter (Signed)
FYI  I think that they mean MRSA maybe and that they were just notifying us.

## 2020-06-11 NOTE — Progress Notes (Signed)
[facility] for Infectious Disease  Date of Admission:  06/09/2020           Reason for visit: Follow up on MSSA bacteremia  Current antibiotics: Day 3 cefazolin (06/09/2020--present)   ASSESSMENT:    #MSSA bacteremia of unclear etiology  #Peripheral vascular disease status post femoropopliteal bypass graft  #CAD status post CABG  #Diabetes  #Atrial fibrillation on anticoagulation  PLAN:    --Continue cefazolin --Await TTE --Follow-up susceptibilities from admission blood cultures (appears only 1 set was drawn in the ED) --Glycemic control --Monitor for further foci of infection, however, no metastatic sites elicited on exam or by patient history  MEDICATIONS:    Scheduled Meds: . apixaban  5 mg Oral BID  . atenolol  25 mg Oral Daily  . atorvastatin  40 mg Oral Daily  . insulin aspart  0-15 Units Subcutaneous BID AC  . latanoprost  1 drop Both Eyes QHS  . levothyroxine  75 mcg Oral QAC breakfast  . losartan  25 mg Oral Daily  . tamsulosin  0.4 mg Oral Daily    Continuous Infusions: .  ceFAZolin (ANCEF) IV 2 g (06/11/20 0538)    PRN Meds: acetaminophen **OR** acetaminophen, ondansetron **OR** ondansetron (ZOFRAN) IV   INTERVAL EVENTS:   No acute events noted overnight Repeat blood cultures obtained Afebrile Improving leukocytosis   SUBJECTIVE:   Patient has no new complaints this morning.  Tolerating antibiotics.  Review of Systems  Constitutional: Negative for chills and fever.  Respiratory: Negative for cough and shortness of breath.   Cardiovascular: Negative for chest pain and leg swelling.  Gastrointestinal: Negative.   Musculoskeletal: Negative for back pain and joint pain.     OBJECTIVE:   Allergies  Allergen Reactions  . Adhesive [Tape] Rash  . Latex Rash  . Other Hives, Swelling and Rash    Plastic shopping bags Metal staples: rash, swelling    Blood pressure 138/78, pulse 70, temperature 99.3 F (37.4 C), temperature  source Oral, resp. rate 18, SpO2 98 %. There is no height or weight on file to calculate BMI.  Physical Exam Constitutional:      General: He is not in acute distress.    Appearance: Normal appearance.  Pulmonary:     Effort: Pulmonary effort is normal. No respiratory distress.  Musculoskeletal:        General: No swelling or deformity.  Neurological:     General: No focal deficit present.     Mental Status: He is alert and oriented to person, place, and time.  Psychiatric:        Mood and Affect: Mood normal.        Behavior: Behavior normal.       Lab Results & Microbiology Lab Results  Component Value Date   WBC 12.9 (H) 06/11/2020   HGB 11.0 (L) 06/11/2020   HCT 32.2 (L) 06/11/2020   MCV 90.2 06/11/2020   PLT 190 06/11/2020    Lab Results  Component Value Date   NA 138 06/11/2020   K 3.6 06/11/2020   CO2 23 06/11/2020   GLUCOSE 130 (H) 06/11/2020   BUN 20 06/11/2020   CREATININE 1.01 06/11/2020   CALCIUM 8.8 (L) 06/11/2020   GFRNONAA >60 06/11/2020   GFRAA >60 11/22/2019    Lab Results  Component Value Date   ALT 20 06/10/2020   AST 20 06/10/2020   ALKPHOS 58 06/10/2020   BILITOT 1.3 (H) 06/10/2020     I have  reviewed the micro and lab results in [place].  Imaging DG Chest Portable 1 View  Result Date: 06/10/2020 CLINICAL DATA:  Bacteremia EXAM: PORTABLE CHEST 1 VIEW COMPARISON:  Two days ago FINDINGS: Normal heart size and stable mediastinal contours.  CABG. Prominent infrahilar markings but similar to previous studies. No Kerley lines, effusion, or pneumothorax. IMPRESSION: Stable compared to priors.  No convincing pneumonia. Electronically Signed   By: Monte Fantasia M.D.   On: 06/10/2020 04:16       [facility] [phone] pager 06/11/2020, 7:22 AM

## 2020-06-11 NOTE — Progress Notes (Signed)
  Echocardiogram 2D Echocardiogram has been performed.  Harry Brown 06/11/2020, 4:42 PM

## 2020-06-11 NOTE — Plan of Care (Signed)
  Problem: Education: Goal: Knowledge of General Education information will improve Description Including pain rating scale, medication(s)/side effects and non-pharmacologic comfort measures Outcome: Progressing   

## 2020-06-11 NOTE — Progress Notes (Signed)
PROGRESS NOTE  Harry Brown ZOX:[id number] DOB: 11-Apr-1941 DOA: 06/09/2020 PCP: [name], MD   LOS: 1 day   Brief narrative: As per HPI  Harry Brown is a 80 y.o. male with hx CAD s/p CABG, Afib on Eliquis, PVD s/p Right fem-pop in 2014, HTN, DM, and hypothyroidism who presented  to the hospital with with fever and malaise for 2 days, then abnormal blood culture positive for MSSA and so he was called and recommended to return to the ER.In the ER, he remained afebrile with normal heart rate and blood pressure.  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 On IV cefazolin.  ID on board.  With blood cultures.  Check 2D echocardiogram.  Mild leukocytosis.  T-max of 99.3 F.  Urinary symptoms History of incontinence urgency.  UA with pyuria.  Follow urine culture already on cefazolin   Coronary artery disease status CABG/Hypertension/Peripheral vascular disease status post femoropopliteal bypass No acute issues.  Continue  atenolol, atorvastatin  Diabetes mellitus type II, well controlled, with peripheral neuropathy Continue to hold home metformin.  Continue sliding scale insulin, Accu-Cheks, diabetic diet.  Permanent atrial fibrillation -Continue atenolol.continue Eliquis for anticoagulation.  Rate controlled at this time  Hypothyroidism Continue Synthroid  BP -Continue Flomax  DVT prophylaxis:  apixaban (ELIQUIS) tablet 5 mg    Code Status: Full code  Family Communication: spoke with the patient's spouse at bedside.  Status is: Inpatient  Remains  inpatient appropriate because:IV treatments appropriate due to intensity of illness or inability to take PO and MSSA bacteremia   Dispo: The patient is from: Home              Anticipated d/c is to: Home              Anticipated d/c date is: 2 days              Patient currently is not medically stable to d/c.   Consultants:  Infectious disease  Procedures:  None  Antibiotics:  . cefazolin  Anti-infectives (From admission, onward)   Start     Dose/Rate Route Frequency Ordered Stop   06/10/20 0400  ceFAZolin (ANCEF) IVPB 2g/100 mL premix        2 g 200 mL/hr over 30 Minutes Intravenous Every 8 hours 06/10/20 0347       Subjective: Today, patient was seen and examined at bedside.  Patient denies any nausea vomiting fever chills cough or shortness of breath.  Objective: Vitals:   06/10/20 2048 06/11/20 0534  BP: (!) 153/77 138/78  Pulse: 67 70  Resp: 17 18  Temp: 98.4 F (36.9 C) 99.3 F (37.4 C)  SpO2: 97% 98%   No intake or output data in the 24 hours ending 06/11/20 0709 There were no vitals filed for this visit. There is no height or weight on file to calculate BMI.   Physical Exam:  GENERAL: Patient is alert awake and oriented. Not in obvious distress. HENT: No scleral pallor or icterus. Pupils equally reactive to light. Oral mucosa is moist.  Dentures in place NECK: is  supple, no gross swelling noted. CHEST: Clear to auscultation. No crackles or wheezes.  Diminished breath sounds bilaterally. CVS: S1 and S2 heard, no murmur. Regular rate and rhythm.  ABDOMEN: Soft, non-tender, bowel sounds are present. EXTREMITIES: No edema.  Bilateral onychomycosis with mild excoriation of the leg. CNS: Cranial nerves are intact. No focal motor deficits. SKIN: warm and dry without rashes.  Data Review: I have personally reviewed the following laboratory data and studies,  CBC: Recent Labs  Lab 06/08/20 1343 06/10/20 0428 06/11/20 0122  WBC 26.2* 17.2* 12.9*   NEUTROABS 23.2* 13.7*  --   HGB 12.5* 11.7* 11.0*  HCT 38.3* 35.8* 32.2*  MCV 93.9 92.7 90.2  PLT 190 193 350   Basic Metabolic Panel: Recent Labs  Lab 06/08/20 1343 06/10/20 0428 06/11/20 0122  NA 138 135 138  K 4.0 3.7 3.6  CL 104 104 104  CO2 23 22 23   GLUCOSE 121* 114* 130*  BUN 30* 21 20  CREATININE 1.18 1.06 1.01  CALCIUM 9.3 9.0 8.8*   Liver Function Tests: Recent Labs  Lab 06/08/20 1343 06/10/20 0428  AST 21 20  ALT 21 20  ALKPHOS 66 58  BILITOT 1.3* 1.3*  PROT 7.5 6.6  ALBUMIN 4.2 3.4*   No results for input(s): LIPASE, AMYLASE in the last 168 hours. No results for input(s): AMMONIA in the last 168 hours. Cardiac Enzymes: No results for input(s): CKTOTAL, CKMB, CKMBINDEX, TROPONINI in the last 168 hours. BNP (last 3 results) No results for input(s): BNP in the last 8760 hours.  ProBNP (last 3 results) No results for input(s): PROBNP in the last 8760 hours.  CBG: Recent Labs  Lab 06/10/20 1622 06/10/20 2044 06/11/20 0613  GLUCAP 142* 135* 120*   Recent Results (from the past 240 hour(s))  Culture, blood (Routine x 2)     Status: Abnormal (Preliminary result)   Collection Time: 06/08/20  1:42 PM   Specimen: BLOOD  Result Value Ref Range Status   Specimen Description   Final    BLOOD RIGHT ANTECUBITAL Performed at [facility] [address], [place], [place] [address]    Special Requests   Final    BOTTLES DRAWN AEROBIC AND ANAEROBIC Blood Culture adequate volume Performed at [facility] [address] [place], [place] [address]    Culture  Setup Time   Final    GRAM POSITIVE COCCI IN CLUSTERS AEROBIC BOTTLE ONLY CRITICAL RESULT CALLED TO, READ BACK BY AND VERIFIED WITH: H. Hopkins 11:40 06/09/20 ([email or web address])    Culture (A)  Final    STAPHYLOCOCCUS AUREUS SUSCEPTIBILITIES TO FOLLOW Performed at [facility], [place] [address] [place], [place] [address]    Report Status PENDING  Incomplete   Blood Culture ID Panel (Reflexed)     Status: Abnormal   Collection Time: 06/08/20  1:42 PM  Result Value Ref Range Status   Enterococcus faecalis NOT DETECTED NOT DETECTED Final   Enterococcus Faecium NOT DETECTED NOT DETECTED Final   Listeria monocytogenes NOT DETECTED NOT DETECTED Final   Staphylococcus species DETECTED (A) NOT DETECTED Final    Comment: CRITICAL RESULT CALLED TO, READ BACK BY AND VERIFIED WITH: H. Hopkins 11:40 06/09/20 ([email or web address])    Staphylococcus aureus (BCID) DETECTED (A) NOT DETECTED Final    Comment: CRITICAL RESULT CALLED TO, READ BACK BY AND VERIFIED WITH: H. Hopkins 11:40 06/09/20 ([email or web address])    Staphylococcus epidermidis NOT DETECTED NOT DETECTED Final   Staphylococcus lugdunensis NOT DETECTED NOT DETECTED  Final   Streptococcus species NOT DETECTED NOT DETECTED Final   Streptococcus agalactiae NOT DETECTED NOT DETECTED Final   Streptococcus pneumoniae NOT DETECTED NOT DETECTED Final   Streptococcus pyogenes NOT DETECTED NOT DETECTED Final   A.calcoaceticus-baumannii NOT DETECTED NOT DETECTED Final   Bacteroides fragilis NOT DETECTED NOT DETECTED Final   Enterobacterales NOT DETECTED NOT DETECTED Final   Enterobacter cloacae complex NOT DETECTED NOT DETECTED Final   Escherichia coli NOT DETECTED NOT DETECTED Final   Klebsiella aerogenes NOT DETECTED NOT DETECTED Final   Klebsiella oxytoca NOT DETECTED NOT DETECTED Final   Klebsiella pneumoniae NOT DETECTED NOT DETECTED Final   Proteus species NOT DETECTED NOT DETECTED Final   Salmonella species NOT DETECTED NOT DETECTED Final   Serratia marcescens NOT DETECTED NOT DETECTED Final   Haemophilus influenzae NOT DETECTED NOT DETECTED Final   Neisseria meningitidis NOT DETECTED NOT DETECTED Final   Pseudomonas aeruginosa NOT DETECTED NOT DETECTED Final   Stenotrophomonas maltophilia NOT DETECTED NOT DETECTED Final   Candida albicans NOT DETECTED NOT DETECTED Final   Candida auris NOT DETECTED NOT DETECTED Final    Candida glabrata NOT DETECTED NOT DETECTED Final   Candida krusei NOT DETECTED NOT DETECTED Final   Candida parapsilosis NOT DETECTED NOT DETECTED Final   Candida tropicalis NOT DETECTED NOT DETECTED Final   Cryptococcus neoformans/gattii NOT DETECTED NOT DETECTED Final   Meth resistant mecA/C and MREJ NOT DETECTED NOT DETECTED Final    Comment: Performed at [facility], [address] [place], [place] [address]  Resp Panel by RT-PCR (Flu A&B, Covid) Nasopharyngeal Swab     Status: None   Collection Time: 06/10/20  5:30 AM   Specimen: Nasopharyngeal Swab; Nasopharyngeal(NP) swabs in vial transport medium  Result Value Ref Range Status   SARS Coronavirus 2 by RT PCR NEGATIVE NEGATIVE Final    Comment: (NOTE) SARS-CoV-2 target nucleic acids are NOT DETECTED.  The SARS-CoV-2 RNA is generally detectable in upper respiratory specimens during the acute phase of infection. The lowest concentration of SARS-CoV-2 viral copies this assay can detect is 138 copies/mL. A negative result does not preclude SARS-Cov-2 infection and should not be used as the sole basis for treatment or other patient management decisions. A negative result may occur with  improper specimen collection/handling, submission of specimen other than nasopharyngeal swab, presence of viral mutation(s) within the areas targeted by this assay, and inadequate number of viral copies(<138 copies/mL). A negative result must be combined with clinical observations, patient history, and epidemiological information. The expected result is Negative.  Fact Sheet for Patients:  EntrepreneurPulse.com.au  Fact Sheet for Healthcare Providers:  IncredibleEmployment.be  This test is no t yet approved or cleared by the [place] FDA and  has been authorized for detection and/or diagnosis of SARS-CoV-2 by FDA under an Emergency Use Authorization (EUA). This EUA will remain  in effect (meaning  this test can be used) for the duration of the COVID-19 declaration under Section 564(b)(1) of the Act, 21 U.S.C.section 360bbb-3(b)(1), unless the authorization is terminated  or revoked sooner.       Influenza A by PCR NEGATIVE NEGATIVE Final   Influenza B by PCR NEGATIVE NEGATIVE Final    Comment: (NOTE) The Xpert Xpress SARS-CoV-2/FLU/RSV plus assay is intended as an aid in the diagnosis of influenza from Nasopharyngeal swab specimens and should not be used as a sole basis for treatment. Nasal washings and aspirates are unacceptable for Xpert Xpress SARS-CoV-2/FLU/RSV testing.  Fact Sheet for Patients: EntrepreneurPulse.com.au  Fact Sheet for Healthcare Providers: IncredibleEmployment.be  This test is not yet approved or cleared by the [place] FDA and has been authorized for detection and/or diagnosis of SARS-CoV-2 by FDA under an Emergency Use Authorization (EUA). This EUA will remain in effect (meaning this test can be used) for the duration of the COVID-19 declaration under Section 564(b)(1) of the Act, 21 U.S.C. section 360bbb-3(b)(1), unless the authorization is terminated or revoked.  Performed at [facility], [place] [address] [place], [place] [address]      Studies: DG Chest Portable 1 View  Result Date: 06/10/2020 CLINICAL DATA:  Bacteremia EXAM: PORTABLE CHEST 1 VIEW COMPARISON:  Two days ago FINDINGS: Normal heart size and stable mediastinal contours.  CABG. Prominent infrahilar markings but similar to previous studies. No Kerley lines, effusion, or pneumothorax. IMPRESSION: Stable compared to priors.  No convincing pneumonia. Electronically Signed   By: Monte Fantasia M.D.   On: 06/10/2020 04:16      Flora Lipps, MD  [facility] 06/11/2020  If 7PM-7AM, please contact night-coverage

## 2020-06-12 ENCOUNTER — Telehealth: Payer: Self-pay | Admitting: Emergency Medicine

## 2020-06-12 ENCOUNTER — Ambulatory Visit: Payer: Self-pay

## 2020-06-12 DIAGNOSIS — I2510 Atherosclerotic heart disease of native coronary artery without angina pectoris: Secondary | ICD-10-CM

## 2020-06-12 DIAGNOSIS — B9561 Methicillin susceptible Staphylococcus aureus infection as the cause of diseases classified elsewhere: Secondary | ICD-10-CM | POA: Diagnosis not present

## 2020-06-12 DIAGNOSIS — M7989 Other specified soft tissue disorders: Secondary | ICD-10-CM

## 2020-06-12 DIAGNOSIS — I739 Peripheral vascular disease, unspecified: Secondary | ICD-10-CM | POA: Diagnosis not present

## 2020-06-12 DIAGNOSIS — I4891 Unspecified atrial fibrillation: Secondary | ICD-10-CM

## 2020-06-12 DIAGNOSIS — R7881 Bacteremia: Secondary | ICD-10-CM | POA: Diagnosis not present

## 2020-06-12 DIAGNOSIS — E1169 Type 2 diabetes mellitus with other specified complication: Secondary | ICD-10-CM

## 2020-06-12 LAB — URINE CULTURE: Culture: >=100000 — AB

## 2020-06-12 LAB — GLUCOSE, CAPILLARY
Glucose-Capillary: 110 mg/dL — ABNORMAL HIGH (ref 70–99)
Glucose-Capillary: 97 mg/dL (ref 70–99)

## 2020-06-12 MED ORDER — SODIUM CHLORIDE 0.9 % IV SOLN: INTRAVENOUS | Status: DC

## 2020-06-12 NOTE — Progress Notes (Addendum)
PROGRESS NOTE  Harry Brown NLG:[id number] DOB: 02/04/1941 DOA: 06/09/2020 PCP: [name], MD   LOS: 2 days   Brief narrative: Harry Brown is a 80 y.o. male with hx CAD s/p CABG, Afib on Eliquis, PVD s/p Right fem-pop in 2014, HTN, DM, and hypothyroidism who presented  to the hospital with with fever and malaise for 2 days, then abnormal blood culture positive for MSSA and so he was called and recommended to return to the ER.In the ER, he remained afebrile with normal heart rate and blood pressure.  He was started on cefazolin, and the hospitalist service were asked to evaluate for staph bacteremia.    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)   MSSA bacteremia-unclear source. On IV cefazolin.  ID on board.  Follow up with repeat blood cultures, negative in 1 day..   2D echocardiogram performed on 06/11/2020 showed normal LV function with no mention of vegetation.  Mild leukocytosis.  T-max of 100 F.  Patient will likely need PICC line placement and prolonged antibiotics.   Urinary symptoms, likely UTI Urine culture showing more than 100,000 colonies of staph aureus.  History of incontinence, urgency.   already on cefazolin   Coronary artery disease status CABG/Hypertension/Peripheral vascular disease status post femoropopliteal bypass No acute issues.  Continue  atenolol, atorvastatin   Diabetes mellitus type II, well controlled, with peripheral neuropathy Continue to hold home metformin.  Continue sliding scale insulin, Accu-Cheks, diabetic diet.  Latest POC glucose of 110   Permanent atrial fibrillation -Continue atenolol. Eliquis.  Rate controlled at this time   Hypothyroidism Continue Synthroid    BP -Continue Flomax  DVT prophylaxis:  apixaban (ELIQUIS) tablet 5 mg    Code Status: Full code  Family Communication:   None Today.  Status is: Inpatient  Remains inpatient appropriate because:IV treatments appropriate due to intensity of illness or inability to take PO and MSSA bacteremia  IV antibiotic, ID consultation and follow-up   Dispo: The patient is from: Home              Anticipated d/c is to: Home              Anticipated d/c date is: 2 days, follow ID recommendations              Patient currently is not medically stable to d/c.   Consultants: Infectious disease  Procedures: None  Antibiotics:  cefazolin  Anti-infectives (From admission, onward)    Start     Dose/Rate Route Frequency Ordered Stop   06/10/20 0400  ceFAZolin (ANCEF) IVPB 2g/100 mL premix        2 g 200 mL/hr over 30 Minutes Intravenous Every 8 hours 06/10/20 0347        Subjective: Today, patient was seen and examined at bedside.  Denies any fever, chills or rigor.  Denies any urinary urgency frequency or dysuria.  Denies cough congestion or shortness of breath  Objective: Vitals:   06/11/20 2202 06/12/20 0500  BP: (!) 163/73 (!) 157/65  Pulse: 68 67  Resp: (!) 22 18  Temp: 100 F (37.8 C) 99.5 F (37.5 C)  SpO2: 99% 97%    Intake/Output Summary (Last 24 hours) at  06/12/2020 0829 Last data filed at 06/12/2020 0506 Gross per 24 hour  Intake 300 ml  Output --  Net 300 ml   There were no vitals filed for this visit. There is no height or weight on file to calculate BMI.   Physical Exam: GENERAL: Patient is alert awake and oriented. Not in obvious distress.  On room air. HENT: No scleral pallor or icterus. Pupils equally reactive to light. Oral mucosa is moist.  Dentures in place NECK: is supple, no gross swelling noted. CHEST: Clear to auscultation. No crackles or wheezes.  Diminished breath sounds bilaterally. CVS: S1 and S2 heard, no murmur. Regular rate and rhythm.   ABDOMEN: Soft, non-tender, bowel sounds are present. EXTREMITIES: No edema.  Bilateral onychomycosis with mild excoriation of the leg. CNS: Cranial nerves are intact. No focal motor deficits. SKIN: warm and dry without rashes.  Data Review: I have personally reviewed the following laboratory data and studies,  CBC: Recent Labs  Lab 06/08/20 1343 06/10/20 0428 06/11/20 0122  WBC 26.2* 17.2* 12.9*  NEUTROABS 23.2* 13.7*  --   HGB 12.5* 11.7* 11.0*  HCT 38.3* 35.8* 32.2*  MCV 93.9 92.7 90.2  PLT 190 193 423   Basic Metabolic Panel: Recent Labs  Lab 06/08/20 1343 06/10/20 0428 06/11/20 0122  NA 138 135 138  K 4.0 3.7 3.6  CL 104 104 104  CO2 23 22 23   GLUCOSE 121* 114* 130*  BUN 30* 21 20  CREATININE 1.18 1.06 1.01  CALCIUM 9.3 9.0 8.8*   Liver Function Tests: Recent Labs  Lab 06/08/20 1343 06/10/20 0428  AST 21 20  ALT 21 20  ALKPHOS 66 58  BILITOT 1.3* 1.3*  PROT 7.5 6.6  ALBUMIN 4.2 3.4*   No results for input(s): LIPASE, AMYLASE in the last 168 hours. No results for input(s): AMMONIA in the last 168 hours. Cardiac Enzymes: No results for input(s): CKTOTAL, CKMB, CKMBINDEX, TROPONINI in the last 168 hours. BNP (last 3 results) No results for input(s): BNP in the last 8760 hours.  ProBNP (last 3 results) No results for input(s): PROBNP in the last 8760 hours.  CBG: Recent Labs  Lab 06/10/20 2044 06/11/20 0613 06/11/20 0758 06/11/20 1650 06/12/20 0734  GLUCAP 135* 120* 104* 110* 110*   Recent Results (from the past 240 hour(s))  Culture, blood (Routine x 2)     Status: Abnormal   Collection Time: 06/08/20  1:42 PM   Specimen: BLOOD  Result Value Ref Range Status   Specimen Description   Final    BLOOD RIGHT ANTECUBITAL Performed at [facility], [place] [address] [place], [place] [address]    Special Requests   Final    BOTTLES DRAWN AEROBIC AND ANAEROBIC Blood Culture adequate volume Performed at [facility] [address], [place], [place] [address]    Culture  Setup Time   Final    GRAM POSITIVE COCCI IN CLUSTERS AEROBIC BOTTLE ONLY CRITICAL RESULT CALLED TO, READ BACK BY AND VERIFIED WITH: H. Hopkins 11:40 06/09/20 ([email or web address]) Performed at [facility], [address], [place], [place] [address]    Culture STAPHYLOCOCCUS AUREUS (A)  Final   Report Status 06/11/2020 FINAL  Final   Organism ID, Bacteria STAPHYLOCOCCUS AUREUS  Final      Susceptibility   Staphylococcus aureus - MIC*    CIPROFLOXACIN <=0.5 SENSITIVE Sensitive     ERYTHROMYCIN >=8 RESISTANT Resistant     GENTAMICIN <=0.5 SENSITIVE Sensitive  OXACILLIN 0.5 SENSITIVE Sensitive     TETRACYCLINE <=1 SENSITIVE Sensitive     VANCOMYCIN 1 SENSITIVE Sensitive     TRIMETH/SULFA <=10 SENSITIVE Sensitive     CLINDAMYCIN <=0.25 SENSITIVE Sensitive     RIFAMPIN <=0.5 SENSITIVE Sensitive     Inducible Clindamycin NEGATIVE Sensitive     * STAPHYLOCOCCUS AUREUS  Blood Culture ID Panel (Reflexed)     Status: Abnormal   Collection Time: 06/08/20  1:42 PM  Result Value Ref Range Status   Enterococcus faecalis NOT DETECTED NOT DETECTED Final   Enterococcus Faecium NOT DETECTED NOT DETECTED Final   Listeria monocytogenes NOT DETECTED NOT DETECTED Final   Staphylococcus species DETECTED (A) NOT DETECTED Final    Comment: CRITICAL RESULT CALLED TO, READ BACK BY AND VERIFIED WITH: H. Hopkins 11:40 06/09/20 ([email or web address])    Staphylococcus aureus (BCID) DETECTED (A) NOT DETECTED Final    Comment: CRITICAL RESULT CALLED TO, READ BACK BY AND VERIFIED WITH: H. Hopkins 11:40 06/09/20 ([email or web address])    Staphylococcus epidermidis NOT DETECTED NOT DETECTED Final   Staphylococcus lugdunensis NOT DETECTED NOT DETECTED Final   Streptococcus species NOT DETECTED NOT DETECTED Final   Streptococcus agalactiae NOT DETECTED NOT DETECTED Final   Streptococcus pneumoniae NOT DETECTED NOT DETECTED Final   Streptococcus pyogenes NOT DETECTED NOT DETECTED  Final   A.calcoaceticus-baumannii NOT DETECTED NOT DETECTED Final   Bacteroides fragilis NOT DETECTED NOT DETECTED Final   Enterobacterales NOT DETECTED NOT DETECTED Final   Enterobacter cloacae complex NOT DETECTED NOT DETECTED Final   Escherichia coli NOT DETECTED NOT DETECTED Final   Klebsiella aerogenes NOT DETECTED NOT DETECTED Final   Klebsiella oxytoca NOT DETECTED NOT DETECTED Final   Klebsiella pneumoniae NOT DETECTED NOT DETECTED Final   Proteus species NOT DETECTED NOT DETECTED Final   Salmonella species NOT DETECTED NOT DETECTED Final   Serratia marcescens NOT DETECTED NOT DETECTED Final   Haemophilus influenzae NOT DETECTED NOT DETECTED Final   Neisseria meningitidis NOT DETECTED NOT DETECTED Final   Pseudomonas aeruginosa NOT DETECTED NOT DETECTED Final   Stenotrophomonas maltophilia NOT DETECTED NOT DETECTED Final   Candida albicans NOT DETECTED NOT DETECTED Final   Candida auris NOT DETECTED NOT DETECTED Final   Candida glabrata NOT DETECTED NOT DETECTED Final   Candida krusei NOT DETECTED NOT DETECTED Final   Candida parapsilosis NOT DETECTED NOT DETECTED Final   Candida tropicalis NOT DETECTED NOT DETECTED Final   Cryptococcus neoformans/gattii NOT DETECTED NOT DETECTED Final   Meth resistant mecA/C and MREJ NOT DETECTED NOT DETECTED Final    Comment: Performed at [facility], [address] [place], [place] [address]  Blood culture (routine x 2)     Status: None (Preliminary result)   Collection Time: 06/10/20  4:28 AM   Specimen: BLOOD  Result Value Ref Range Status   Specimen Description BLOOD LEFT ARM  Final   Special Requests   Final    BOTTLES DRAWN AEROBIC AND ANAEROBIC Blood Culture results may not be optimal due to an excessive volume of blood received in culture bottles   Culture   Final    NO GROWTH 1 DAY Performed at [facility], [place] [address] [place], [place] [address]    Report Status PENDING  Incomplete  Blood culture (routine x  2)     Status: None (Preliminary result)   Collection Time: 06/10/20  4:34 AM   Specimen: BLOOD  Result Value Ref Range Status   Specimen Description BLOOD RIGHT ARM  Final   Special Requests   Final    BOTTLES DRAWN AEROBIC AND ANAEROBIC Blood Culture results may not be optimal due to an inadequate volume of blood received in culture bottles   Culture   Final    NO GROWTH 1 DAY Performed at [facility] [address] [place], [place] [address]    Report Status PENDING  Incomplete  Resp Panel by RT-PCR (Flu A&B, Covid) Nasopharyngeal Swab     Status: None   Collection Time: 06/10/20  5:30 AM   Specimen: Nasopharyngeal Swab; Nasopharyngeal(NP) swabs in vial transport medium  Result Value Ref Range Status   SARS Coronavirus 2 by RT PCR NEGATIVE NEGATIVE Final    Comment: (NOTE) SARS-CoV-2 target nucleic acids are NOT DETECTED.  The SARS-CoV-2 RNA is generally detectable in upper respiratory specimens during the acute phase of infection. The lowest concentration of SARS-CoV-2 viral copies this assay can detect is 138 copies/mL. A negative result does not preclude SARS-Cov-2 infection and should not be used as the sole basis for treatment or other patient management decisions. A negative result may occur with  improper specimen collection/handling, submission of specimen other than nasopharyngeal swab, presence of viral mutation(s) within the areas targeted by this assay, and inadequate number of viral copies(<138 copies/mL). A negative result must be combined with clinical observations, patient history, and epidemiological information. The expected result is Negative.  Fact Sheet for Patients:  EntrepreneurPulse.com.au  Fact Sheet for Healthcare Providers:  IncredibleEmployment.be  This test is no t yet approved or cleared by the [place] FDA and  has been authorized for detection and/or diagnosis of SARS-CoV-2 by FDA under an  Emergency Use Authorization (EUA). This EUA will remain  in effect (meaning this test can be used) for the duration of the COVID-19 declaration under Section 564(b)(1) of the Act, 21 U.S.C.section 360bbb-3(b)(1), unless the authorization is terminated  or revoked sooner.       Influenza A by PCR NEGATIVE NEGATIVE Final   Influenza B by PCR NEGATIVE NEGATIVE Final    Comment: (NOTE) The Xpert Xpress SARS-CoV-2/FLU/RSV plus assay is intended as an aid in the diagnosis of influenza from Nasopharyngeal swab specimens and should not be used as a sole basis for treatment. Nasal washings and aspirates are unacceptable for Xpert Xpress SARS-CoV-2/FLU/RSV testing.  Fact Sheet for Patients: EntrepreneurPulse.com.au  Fact Sheet for Healthcare Providers: IncredibleEmployment.be  This test is not yet approved or cleared by the [place] FDA and has been authorized for detection and/or diagnosis of SARS-CoV-2 by FDA under an Emergency Use Authorization (EUA). This EUA will remain in effect (meaning this test can be used) for the duration of the COVID-19 declaration under Section 564(b)(1) of the Act, 21 U.S.C. section 360bbb-3(b)(1), unless the authorization is terminated or revoked.  Performed at [facility], [place] [address] [place], [place] [address]   Urine culture     Status: Abnormal (Preliminary result)   Collection Time: 06/10/20  8:53 AM   Specimen: Urine, Random  Result Value Ref Range Status   Specimen Description URINE, RANDOM  Final   Special Requests NONE  Final   Culture (A)  Final    >=100,000 COLONIES/mL STAPHYLOCOCCUS AUREUS SUSCEPTIBILITIES TO FOLLOW Performed at [facility], [place] [address], [place], [place] [address]    Report Status PENDING  Incomplete     Studies: ECHOCARDIOGRAM COMPLETE  Result Date: 06/11/2020    ECHOCARDIOGRAM REPORT   Patient Name:   TAYVION LAUDER  Kopke Date of Exam: 06/11/2020 Medical Rec #:   [id number]        Height:       72.0 in Accession #:    [id number]       Weight:       190.0 lb Date of Birth:  Jan 18, 1941        BSA:          2.085 m Patient Age:    24 years         BP:           124/53 mmHg Patient Gender: M                HR:           54 bpm. Exam Location:  Inpatient Procedure: 2D Echo Indications:    bacteremia  History:        Patient has prior history of Echocardiogram examinations, most                 recent 01/04/2020. Prior CABG, Arrythmias:Atrial Fibrillation;                 Risk Factors:Diabetes, Hypertension and Sleep Apnea.  Sonographer:    Johny Chess Referring Phys: [id number] CHRISTOPHER P DANFORD IMPRESSIONS  1. Left ventricular ejection fraction, by estimation, is 60 to 65%. The left ventricle has normal function. The left ventricle has no regional wall motion abnormalities. Left ventricular diastolic function could not be evaluated.  2. Right ventricular systolic function was not well visualized. The right ventricular size is normal. There is normal pulmonary artery systolic pressure.  3. The mitral valve is degenerative. Mild mitral valve regurgitation. No evidence of mitral stenosis.  4. Tricuspid valve regurgitation is mild to moderate.  5. The aortic valve is tricuspid. There is moderate calcification of the aortic valve. There is moderate thickening of the aortic valve. Aortic valve regurgitation is mild to moderate. Mild to moderate aortic valve sclerosis/calcification is present, without any evidence of aortic stenosis.  6. Aortic dilatation noted. There is mild dilatation of the ascending aorta, measuring 40 mm.  7. The inferior vena cava is dilated in size with >50% respiratory variability, suggesting right atrial pressure of 8 mmHg.  8. Suboptimol images limits ability to rule out vegetation. Consider TEE if clinically indicated. FINDINGS  Left Ventricle: Left ventricular ejection fraction, by estimation, is 60 to 65%. The left ventricle has normal function. The  left ventricle has no regional wall motion abnormalities. The left ventricular internal cavity size was normal in size. There is  no left ventricular hypertrophy. Left ventricular diastolic function could not be evaluated due to atrial fibrillation. Left ventricular diastolic function could not be evaluated. Right Ventricle: The right ventricular size is normal. No increase in right ventricular wall thickness. Right ventricular systolic function was not well visualized. There is normal pulmonary artery systolic pressure. The tricuspid regurgitant velocity is  2.57 m/s, and with an assumed right atrial pressure of 8 mmHg, the estimated right ventricular systolic pressure is 54.6 mmHg. Left Atrium: Left atrial size was normal in size. Right Atrium: Right atrial size was normal in size. Pericardium: There is no evidence of pericardial effusion. Mitral Valve: The mitral valve is degenerative in appearance. There is mild thickening of the mitral valve leaflet(s). Mild to moderate mitral annular calcification. Mild mitral valve regurgitation. No evidence of mitral valve stenosis. Tricuspid Valve: The tricuspid valve is normal in structure. Tricuspid valve regurgitation is mild to moderate. No evidence of  tricuspid stenosis. Aortic Valve: The aortic valve is tricuspid. There is moderate calcification of the aortic valve. There is moderate thickening of the aortic valve. Aortic valve regurgitation is mild to moderate. Mild to moderate aortic valve sclerosis/calcification is present, without any evidence of aortic stenosis. Pulmonic Valve: The pulmonic valve was normal in structure. Pulmonic valve regurgitation is not visualized. No evidence of pulmonic stenosis. Aorta: Aortic dilatation noted. There is mild dilatation of the ascending aorta, measuring 40 mm. Venous: The inferior vena cava is dilated in size with greater than 50% respiratory variability, suggesting right atrial pressure of 8 mmHg. IAS/Shunts: No atrial level  shunt detected by color flow Doppler.  LEFT VENTRICLE PLAX 2D LVIDd:         5.10 cm LVIDs:         3.60 cm LV PW:         1.10 cm LV IVS:        0.90 cm LVOT diam:     2.00 cm LV SV:         58 LV SV Index:   28 LVOT Area:     3.14 cm  RIGHT VENTRICLE             IVC RV S prime:     10.70 cm/s  IVC diam: 2.50 cm TAPSE (M-mode): 1.3 cm LEFT ATRIUM             Index       RIGHT ATRIUM           Index LA diam:        4.20 cm 2.01 cm/m  RA Area:     13.80 cm LA Vol (A2C):   54.2 ml 26.00 ml/m RA Volume:   31.50 ml  15.11 ml/m LA Vol (A4C):   78.8 ml 37.80 ml/m LA Biplane Vol: 68.3 ml 32.76 ml/m  AORTIC VALVE LVOT Vmax:   75.40 cm/s LVOT Vmean:  56.600 cm/s LVOT VTI:    0.186 m  AORTA Ao Root diam: 3.30 cm Ao Asc diam:  4.00 cm TRICUSPID VALVE TR Peak grad:   26.4 mmHg TR Vmax:        257.00 cm/s  SHUNTS Systemic VTI:  0.19 m Systemic Diam: 2.00 cm Fransico Him MD Electronically signed by Fransico Him MD Signature Date/Time: 06/11/2020/5:20:21 PM    Final       Flora Lipps, MD  [facility] 06/12/2020  If 7PM-7AM, please contact night-coverage

## 2020-06-12 NOTE — H&P (View-Only) (Signed)
[facility] for Infectious Disease  Date of Admission:  06/09/2020           Reason for visit: Follow up on MSSA bacteremia  Current antibiotics: Day 4 cefazolin (06/09/2020--present)  ASSESSMENT:    # MSSA bacteremia Bacteremia is of unclear etiology given lack of symptoms and no metastatic sites elicited by exam or history.  Urine culture has also grown MSSA, likely seeded from bloodstream.  Repeat cultures obtained 06/10/20 are NGTD  # Peripheral vascular disease Status post femoropopliteal bypass graft  # CAD Status post CABG  # Diabetes A1c of 6.5 in October 2021  # Atrial fibrillation On anticoagulation   PLAN:    --Continue Cefazolin 2gm q8h --Recommend TEE to define course of treatment especially since TTE had sub optimal images --If blood cultures remain negative tomorrow morning, okay to place PICC --Glycemic control, anticoagulation, rate control per primary --Will follow  MEDICATIONS:    Scheduled Meds:  apixaban  5 mg Oral BID   atenolol  25 mg Oral Daily   atorvastatin  40 mg Oral Daily   insulin aspart  0-15 Units Subcutaneous BID AC   latanoprost  1 drop Both Eyes QHS   levothyroxine  75 mcg Oral QAC breakfast   losartan  25 mg Oral Daily   tamsulosin  0.4 mg Oral Daily    Continuous Infusions:   ceFAZolin (ANCEF) IV 2 g (06/12/20 0515)    PRN Meds: acetaminophen **OR** acetaminophen, ondansetron **OR** ondansetron (ZOFRAN) IV   INTERVAL EVENTS:   No acute events noted TTE negative  SUBJECTIVE:   Patient up out of bed this morning.  Reports IV infiltrated right forearm, but not painful.  Reports a fever blister of lip  Review of Systems  Constitutional: Negative for chills and fever.  Respiratory: Negative.   Cardiovascular: Negative.   Skin:       + Swelling of right arm at site of prior PIV.  No warmth or erythema.   All other systems reviewed and are negative.    OBJECTIVE:   Allergies  Allergen Reactions    Adhesive [Tape] Rash   Latex Rash   Other Hives, Swelling and Rash    Plastic shopping bags Metal staples: rash, swelling    Blood pressure (!) 157/65, pulse 67, temperature 99.5 F (37.5 C), temperature source Oral, resp. rate 18, SpO2 97 %. There is no height or weight on file to calculate BMI.  Physical Exam Constitutional:      General: He is not in acute distress.    Appearance: Normal appearance.  HENT:     Head: Normocephalic and atraumatic.     Mouth/Throat:     Comments: Fever blister of upper lip. Musculoskeletal:        General: Swelling present.     Comments: Swelling of right arm at site of prior PIV.  No warmth or erythema.   Neurological:     General: No focal deficit present.     Mental Status: He is alert and oriented to person, place, and time.  Psychiatric:        Mood and Affect: Mood normal.        Behavior: Behavior normal.       Lab Results & Microbiology Lab Results  Component Value Date   WBC 12.9 (H) 06/11/2020   HGB 11.0 (L) 06/11/2020   HCT 32.2 (L) 06/11/2020   MCV 90.2 06/11/2020   PLT 190 06/11/2020    Lab Results  Component Value Date   NA 138 06/11/2020   K 3.6 06/11/2020   CO2 23 06/11/2020   GLUCOSE 130 (H) 06/11/2020   BUN 20 06/11/2020   CREATININE 1.01 06/11/2020   CALCIUM 8.8 (L) 06/11/2020   GFRNONAA >60 06/11/2020   GFRAA >60 11/22/2019    Lab Results  Component Value Date   ALT 20 06/10/2020   AST 20 06/10/2020   ALKPHOS 58 06/10/2020   BILITOT 1.3 (H) 06/10/2020     I have reviewed the micro and lab results in Epic.  Imaging ECHOCARDIOGRAM COMPLETE  Result Date: 06/11/2020    ECHOCARDIOGRAM REPORT   Patient Name:   Harry Brown Date of Exam: 06/11/2020 Medical Rec #:  [id number]        Height:       72.0 in Accession #:    [id number]       Weight:       190.0 lb Date of Birth:  [birth year]        BSA:          2.085 m Patient Age:    80 years         BP:           124/53 mmHg Patient Gender: M                 HR:           54 bpm. Exam Location:  Inpatient Procedure: 2D Echo Indications:    bacteremia  History:        Patient has prior history of Echocardiogram examinations, most                 recent 01/04/2020. Prior CABG, Arrythmias:Atrial Fibrillation;                 Risk Factors:Diabetes, Hypertension and Sleep Apnea.  Sonographer:    Johny Chess Referring Phys: [id number] CHRISTOPHER P DANFORD IMPRESSIONS  1. Left ventricular ejection fraction, by estimation, is 60 to 65%. The left ventricle has normal function. The left ventricle has no regional wall motion abnormalities. Left ventricular diastolic function could not be evaluated.  2. Right ventricular systolic function was not well visualized. The right ventricular size is normal. There is normal pulmonary artery systolic pressure.  3. The mitral valve is degenerative. Mild mitral valve regurgitation. No evidence of mitral stenosis.  4. Tricuspid valve regurgitation is mild to moderate.  5. The aortic valve is tricuspid. There is moderate calcification of the aortic valve. There is moderate thickening of the aortic valve. Aortic valve regurgitation is mild to moderate. Mild to moderate aortic valve sclerosis/calcification is present, without any evidence of aortic stenosis.  6. Aortic dilatation noted. There is mild dilatation of the ascending aorta, measuring 40 mm.  7. The inferior vena cava is dilated in size with >50% respiratory variability, suggesting right atrial pressure of 8 mmHg.  8. Suboptimol images limits ability to rule out vegetation. Consider TEE if clinically indicated. FINDINGS  Left Ventricle: Left ventricular ejection fraction, by estimation, is 60 to 65%. The left ventricle has normal function. The left ventricle has no regional wall motion abnormalities. The left ventricular internal cavity size was normal in size. There is  no left ventricular hypertrophy. Left ventricular diastolic function could not be evaluated due to atrial  fibrillation. Left ventricular diastolic function could not be evaluated. Right Ventricle: The right ventricular size is normal. No increase in right ventricular wall thickness. Right ventricular systolic function was not  well visualized. There is normal pulmonary artery systolic pressure. The tricuspid regurgitant velocity is  2.57 m/s, and with an assumed right atrial pressure of 8 mmHg, the estimated right ventricular systolic pressure is 90.2 mmHg. Left Atrium: Left atrial size was normal in size. Right Atrium: Right atrial size was normal in size. Pericardium: There is no evidence of pericardial effusion. Mitral Valve: The mitral valve is degenerative in appearance. There is mild thickening of the mitral valve leaflet(s). Mild to moderate mitral annular calcification. Mild mitral valve regurgitation. No evidence of mitral valve stenosis. Tricuspid Valve: The tricuspid valve is normal in structure. Tricuspid valve regurgitation is mild to moderate. No evidence of tricuspid stenosis. Aortic Valve: The aortic valve is tricuspid. There is moderate calcification of the aortic valve. There is moderate thickening of the aortic valve. Aortic valve regurgitation is mild to moderate. Mild to moderate aortic valve sclerosis/calcification is present, without any evidence of aortic stenosis. Pulmonic Valve: The pulmonic valve was normal in structure. Pulmonic valve regurgitation is not visualized. No evidence of pulmonic stenosis. Aorta: Aortic dilatation noted. There is mild dilatation of the ascending aorta, measuring 40 mm. Venous: The inferior vena cava is dilated in size with greater than 50% respiratory variability, suggesting right atrial pressure of 8 mmHg. IAS/Shunts: No atrial level shunt detected by color flow Doppler.  LEFT VENTRICLE PLAX 2D LVIDd:         5.10 cm LVIDs:         3.60 cm LV PW:         1.10 cm LV IVS:        0.90 cm LVOT diam:     2.00 cm LV SV:         58 LV SV Index:   28 LVOT Area:     3.14  cm  RIGHT VENTRICLE             IVC RV S prime:     10.70 cm/s  IVC diam: 2.50 cm TAPSE (M-mode): 1.3 cm LEFT ATRIUM             Index       RIGHT ATRIUM           Index LA diam:        4.20 cm 2.01 cm/m  RA Area:     13.80 cm LA Vol (A2C):   54.2 ml 26.00 ml/m RA Volume:   31.50 ml  15.11 ml/m LA Vol (A4C):   78.8 ml 37.80 ml/m LA Biplane Vol: 68.3 ml 32.76 ml/m  AORTIC VALVE LVOT Vmax:   75.40 cm/s LVOT Vmean:  56.600 cm/s LVOT VTI:    0.186 m  AORTA Ao Root diam: 3.30 cm Ao Asc diam:  4.00 cm TRICUSPID VALVE TR Peak grad:   26.4 mmHg TR Vmax:        257.00 cm/s  SHUNTS Systemic VTI:  0.19 m Systemic Diam: 2.00 cm Fransico Him MD Electronically signed by Fransico Him MD Signature Date/Time: 06/11/2020/5:20:21 PM    Final       [facility] for Infectious Disease [facility] [phone] pager 06/12/2020, 10:45 AM

## 2020-06-12 NOTE — Telephone Encounter (Signed)
Post ED Visit - Positive Culture Follow-up  Culture report reviewed by antimicrobial stewardship pharmacist: [place] [facility] []  Elenor Quinones, Pharm.D. []  Heide Guile, Pharm.D., BCPS AQ-ID []  Parks Neptune, Pharm.D., BCPS []  Alycia Rossetti, Pharm.D., BCPS []  [place], [place].D., BCPS, AAHIVP []  Legrand Como, Pharm.D., BCPS, AAHIVP []  Salome Arnt, PharmD, BCPS []  Johnnette Gourd, PharmD, BCPS []  Hughes Better, PharmD, BCPS []  Leeroy Cha, PharmD []  Laqueta Linden, PharmD, BCPS []  Albertina Parr, PharmD  [place] Team []  Leodis Sias, PharmD []  Lindell Spar, PharmD []  Royetta Asal, PharmD []  Graylin Shiver, Rph []  Rema Fendt) Glennon Mac, PharmD []  Arlyn Dunning, PharmD []  Netta Cedars, PharmD []  Dia Sitter, PharmD []  Leone Haven, PharmD []  Gretta Arab, PharmD []  Theodis Shove, PharmD []  Peggyann Juba, PharmD []  Reuel Boom, PharmD Jimmy Footman PharmD   Positive blood culture Currently at [facility] receiving treatment, no further patient follow-up is required at this time.  Hazle Nordmann 06/12/2020, 9:40 AM

## 2020-06-12 NOTE — TOC Initial Note (Addendum)
Transition of Care [facility]) - Initial/Assessment Note    Patient Details  Name: Harry Brown MRN: [id number] Date of Birth: [birth year]  Transition of [facility] CM/SW Contact:    Marilu Favre, RN Phone Number: 06/12/2020, 12:09 PM  Clinical Narrative:                 Patient from home with wife.   Facesheet information confirmed.   Patient possible discharging home on IV ABX. Explained he will have an infusion company [facility]. Pam with [facility] will teach him and his wife how to administer ABX prior to discharge home. He will have a HHRN however HHRN will not be present every time a dose is due. Patient voiced understanding.   Made referral for [facility] to [facility] with [facility] awaiting call back. Cory with [facility] unable to accept.   Pam will arrange [facility]   Expected Discharge Plan: [place]     Patient Goals and CMS Choice Patient states their goals for this hospitalization and ongoing recovery are:: to return to home CMS Medicare.gov Compare Post Acute Care list provided to:: Patient Choice offered to / list presented to : Patient  Expected Discharge Plan and Services Expected Discharge Plan: [place]   Discharge Planning Services: CM Consult Post Acute Care Choice: [place] arrangements for the past 2 months: Single Family Home                   DME Agency: NA       HH Arranged: RN [place] Agency: [place] Date Hall: 06/12/20 Time [address]: 1208 Representative spoke with at [place]: Tommi Rumps , waiting on determination  Prior Living Arrangements/Services Living arrangements for the past 2 months: Single Family Home Lives with:: Spouse Patient language and need for interpreter reviewed:: Yes Do you feel safe going back to the place where you live?: Yes      Need for Family Participation in Patient Care: Yes (Comment) Care giver support system in place?:  Yes (comment)   Criminal Activity/Legal Involvement Pertinent to Current Situation/Hospitalization: No - Comment as needed  Activities of Daily Living Home Assistive Devices/Equipment: Eyeglasses ADL Screening (condition at time of admission) Patient's cognitive ability adequate to safely complete daily activities?: Yes Is the patient deaf or have difficulty hearing?: No Does the patient have difficulty seeing, even when wearing glasses/contacts?: No Does the patient have difficulty concentrating, remembering, or making decisions?: No Patient able to express need for assistance with ADLs?: Yes Does the patient have difficulty dressing or bathing?: No Independently performs ADLs?: Yes (appropriate for developmental age) Does the patient have difficulty walking or climbing stairs?: Yes Weakness of Legs: Both Weakness of Arms/Hands: None  Permission Sought/Granted   Permission granted to share information with : Yes, Verbal Permission Granted     Permission granted to share info w AGENCY: [facility], [facility]        Emotional Assessment Appearance:: Appears younger than stated age Attitude/Demeanor/Rapport: Engaged,Self-Confident Affect (typically observed): Accepting Orientation: : Oriented to Self,Oriented to Place,Oriented to  Time,Oriented to Situation Alcohol / Substance Use: Not Applicable Psych Involvement: No (comment)  Admission diagnosis:  Bacteremia due to Staphylococcus aureus [R78.81, B95.61] MSSA bacteremia [R78.81, B95.61] Patient Active Problem List   Diagnosis Date Noted  . MSSA bacteremia 06/10/2020  . Paresthesia of right lower extremity 04/03/2020  . Urinary frequency 11/19/2019  . Atrial fibrillation ([place]) 11/19/2019  .  Bilateral shoulder pain 10/31/2018  . Educated about COVID-19 virus infection 10/31/2018  . DM (diabetes mellitus) with complications ([place]) 38/93/7342  . Tremor 05/02/2018  . Sleep apnea 09/16/2017  . Nocturia 03/15/2017  .  Hypertension   . Preventative health care 03/13/2016  . Glaucoma 08/18/2014  . Need for viral immunization 09/24/2013  . Anemia 09/24/2013  . Lower back pain 05/14/2013  . Ambulatory dysfunction 05/14/2013  . Benign prostatic hyperplasia 05/14/2013  . Medicare annual wellness visit, subsequent 04/13/2013  . Abdominal aortic aneurysm ([place]) 04/03/2013  . Atherosclerosis of native arteries of extremity with intermittent claudication ([place]) 02/23/2013  . Onychomycosis 11/27/2012  . PVD (peripheral vascular disease) ([place]) 11/27/2012  . Neck pain on left side 11/22/2012  . Peripheral neuropathy 03/31/2012  . Bruit 12/29/2011  . Hypothyroidism 12/16/2011  . 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 Hyperlipidemia 12/16/2011  . Diverticulosis 12/16/2011   PCP:  [name], MD Pharmacy:   [place], [place] - [address] [place] [address] Phone: [phone] Fax: [phone]     Social Determinants of Health (SDOH) Interventions    Readmission Risk Interventions No flowsheet data found.

## 2020-06-12 NOTE — Progress Notes (Signed)
[place] for Infectious Disease  Date of Admission:  06/09/2020           Reason for visit: Follow up on MSSA bacteremia  Current antibiotics: Day 4 cefazolin (06/09/2020--present)  ASSESSMENT:    # MSSA bacteremia Bacteremia is of unclear etiology given lack of symptoms and no metastatic sites elicited by exam or history.  Urine culture has also grown MSSA, likely seeded from bloodstream.  Repeat cultures obtained 06/10/20 are NGTD  # Peripheral vascular disease Status post femoropopliteal bypass graft  # CAD Status post CABG  # Diabetes A1c of 6.5 in October 2021  # Atrial fibrillation On anticoagulation   PLAN:    --Continue Cefazolin 2gm q8h --Recommend TEE to define course of treatment especially since TTE had sub optimal images --If blood cultures remain negative tomorrow morning, okay to place PICC --Glycemic control, anticoagulation, rate control per primary --Will follow  MEDICATIONS:    Scheduled Meds: . apixaban  5 mg Oral BID  . atenolol  25 mg Oral Daily  . atorvastatin  40 mg Oral Daily  . insulin aspart  0-15 Units Subcutaneous BID AC  . latanoprost  1 drop Both Eyes QHS  . levothyroxine  75 mcg Oral QAC breakfast  . losartan  25 mg Oral Daily  . tamsulosin  0.4 mg Oral Daily    Continuous Infusions: .  ceFAZolin (ANCEF) IV 2 g (06/12/20 0515)    PRN Meds: acetaminophen **OR** acetaminophen, ondansetron **OR** ondansetron (ZOFRAN) IV   INTERVAL EVENTS:   No acute events noted TTE negative  SUBJECTIVE:   Patient up out of bed this morning.  Reports IV infiltrated right forearm, but not painful.  Reports a fever blister of lip  Review of Systems  Constitutional: Negative for chills and fever.  Respiratory: Negative.   Cardiovascular: Negative.   Skin:       + Swelling of right arm at site of prior PIV.  No warmth or erythema.   All other systems reviewed and are negative.    OBJECTIVE:   Allergies  Allergen Reactions   . Adhesive [Tape] Rash  . Latex Rash  . Other Hives, Swelling and Rash    Plastic shopping bags Metal staples: rash, swelling    Blood pressure (!) 157/65, pulse 67, temperature 99.5 F (37.5 C), temperature source Oral, resp. rate 18, SpO2 97 %. There is no height or weight on file to calculate BMI.  Physical Exam Constitutional:      General: He is not in acute distress.    Appearance: Normal appearance.  HENT:     Head: Normocephalic and atraumatic.     Mouth/Throat:     Comments: Fever blister of upper lip. Musculoskeletal:        General: Swelling present.     Comments: Swelling of right arm at site of prior PIV.  No warmth or erythema.   Neurological:     General: No focal deficit present.     Mental Status: He is alert and oriented to person, place, and time.  Psychiatric:        Mood and Affect: Mood normal.        Behavior: Behavior normal.       Lab Results & Microbiology Lab Results  Component Value Date   WBC 12.9 (H) 06/11/2020   HGB 11.0 (L) 06/11/2020   HCT 32.2 (L) 06/11/2020   MCV 90.2 06/11/2020   PLT 190 06/11/2020    Lab Results  Component Value Date   NA 138 06/11/2020   K 3.6 06/11/2020   CO2 23 06/11/2020   GLUCOSE 130 (H) 06/11/2020   BUN 20 06/11/2020   CREATININE 1.01 06/11/2020   CALCIUM 8.8 (L) 06/11/2020   GFRNONAA >60 06/11/2020   GFRAA >60 11/22/2019    Lab Results  Component Value Date   ALT 20 06/10/2020   AST 20 06/10/2020   ALKPHOS 58 06/10/2020   BILITOT 1.3 (H) 06/10/2020     I have reviewed the micro and lab results in Epic.  Imaging ECHOCARDIOGRAM COMPLETE  Result Date: 06/11/2020    ECHOCARDIOGRAM REPORT   Patient Name:   Harry Brown Date of Exam: 06/11/2020 Medical Rec #:  [id number]        Height:       72.0 in Accession #:    [id number]       Weight:       190.0 lb Date of Birth:  Feb 05, 1941        BSA:          2.085 m Patient Age:    80 years         BP:           124/53 mmHg Patient Gender: M                 HR:           54 bpm. Exam Location:  Inpatient Procedure: 2D Echo Indications:    bacteremia  History:        Patient has prior history of Echocardiogram examinations, most                 recent 01/04/2020. Prior CABG, Arrythmias:Atrial Fibrillation;                 Risk Factors:Diabetes, Hypertension and Sleep Apnea.  Sonographer:    Johny Chess Referring Phys: [id number] CHRISTOPHER P DANFORD IMPRESSIONS  1. Left ventricular ejection fraction, by estimation, is 60 to 65%. The left ventricle has normal function. The left ventricle has no regional wall motion abnormalities. Left ventricular diastolic function could not be evaluated.  2. Right ventricular systolic function was not well visualized. The right ventricular size is normal. There is normal pulmonary artery systolic pressure.  3. The mitral valve is degenerative. Mild mitral valve regurgitation. No evidence of mitral stenosis.  4. Tricuspid valve regurgitation is mild to moderate.  5. The aortic valve is tricuspid. There is moderate calcification of the aortic valve. There is moderate thickening of the aortic valve. Aortic valve regurgitation is mild to moderate. Mild to moderate aortic valve sclerosis/calcification is present, without any evidence of aortic stenosis.  6. Aortic dilatation noted. There is mild dilatation of the ascending aorta, measuring 40 mm.  7. The inferior vena cava is dilated in size with >50% respiratory variability, suggesting right atrial pressure of 8 mmHg.  8. Suboptimol images limits ability to rule out vegetation. Consider TEE if clinically indicated. FINDINGS  Left Ventricle: Left ventricular ejection fraction, by estimation, is 60 to 65%. The left ventricle has normal function. The left ventricle has no regional wall motion abnormalities. The left ventricular internal cavity size was normal in size. There is  no left ventricular hypertrophy. Left ventricular diastolic function could not be evaluated due to atrial  fibrillation. Left ventricular diastolic function could not be evaluated. Right Ventricle: The right ventricular size is normal. No increase in right ventricular wall thickness. Right ventricular systolic function was not  well visualized. There is normal pulmonary artery systolic pressure. The tricuspid regurgitant velocity is  2.57 m/s, and with an assumed right atrial pressure of 8 mmHg, the estimated right ventricular systolic pressure is 85.0 mmHg. Left Atrium: Left atrial size was normal in size. Right Atrium: Right atrial size was normal in size. Pericardium: There is no evidence of pericardial effusion. Mitral Valve: The mitral valve is degenerative in appearance. There is mild thickening of the mitral valve leaflet(s). Mild to moderate mitral annular calcification. Mild mitral valve regurgitation. No evidence of mitral valve stenosis. Tricuspid Valve: The tricuspid valve is normal in structure. Tricuspid valve regurgitation is mild to moderate. No evidence of tricuspid stenosis. Aortic Valve: The aortic valve is tricuspid. There is moderate calcification of the aortic valve. There is moderate thickening of the aortic valve. Aortic valve regurgitation is mild to moderate. Mild to moderate aortic valve sclerosis/calcification is present, without any evidence of aortic stenosis. Pulmonic Valve: The pulmonic valve was normal in structure. Pulmonic valve regurgitation is not visualized. No evidence of pulmonic stenosis. Aorta: Aortic dilatation noted. There is mild dilatation of the ascending aorta, measuring 40 mm. Venous: The inferior vena cava is dilated in size with greater than 50% respiratory variability, suggesting right atrial pressure of 8 mmHg. IAS/Shunts: No atrial level shunt detected by color flow Doppler.  LEFT VENTRICLE PLAX 2D LVIDd:         5.10 cm LVIDs:         3.60 cm LV PW:         1.10 cm LV IVS:        0.90 cm LVOT diam:     2.00 cm LV SV:         58 LV SV Index:   28 LVOT Area:     3.14  cm  RIGHT VENTRICLE             IVC RV S prime:     10.70 cm/s  IVC diam: 2.50 cm TAPSE (M-mode): 1.3 cm LEFT ATRIUM             Index       RIGHT ATRIUM           Index LA diam:        4.20 cm 2.01 cm/m  RA Area:     13.80 cm LA Vol (A2C):   54.2 ml 26.00 ml/m RA Volume:   31.50 ml  15.11 ml/m LA Vol (A4C):   78.8 ml 37.80 ml/m LA Biplane Vol: 68.3 ml 32.76 ml/m  AORTIC VALVE LVOT Vmax:   75.40 cm/s LVOT Vmean:  56.600 cm/s LVOT VTI:    0.186 m  AORTA Ao Root diam: 3.30 cm Ao Asc diam:  4.00 cm TRICUSPID VALVE TR Peak grad:   26.4 mmHg TR Vmax:        257.00 cm/s  SHUNTS Systemic VTI:  0.19 m Systemic Diam: 2.00 cm Fransico Him MD Electronically signed by Fransico Him MD Signature Date/Time: 06/11/2020/5:20:21 PM    Final       [facility]ious [facility] [phone] pager 06/12/2020, 10:45 AM

## 2020-06-12 NOTE — Progress Notes (Signed)
    [facility] has been requested to perform a transesophageal echocardiogram on Harry Brown for Bacteremia.  After careful review of history and examination, the risks and benefits of transesophageal echocardiogram have been explained including risks of esophageal damage, perforation (1:10,000 risk), bleeding, pharyngeal hematoma as well as other potential complications associated with conscious sedation including aspiration, arrhythmia, respiratory failure and death. Alternatives to treatment were discussed, questions were answered. Patient is willing to proceed.  TEE - Dr. Audie Box @ 1030 tomorrow. NPO after midnight. Meds with sips.   Leanor Kail, PA-C 06/12/2020 4:06 PM

## 2020-06-13 ENCOUNTER — Inpatient Hospital Stay: Payer: Self-pay

## 2020-06-13 ENCOUNTER — Inpatient Hospital Stay (HOSPITAL_COMMUNITY): Payer: Medicare Other | Admitting: Anesthesiology

## 2020-06-13 ENCOUNTER — Encounter (HOSPITAL_COMMUNITY)
Admission: EM | Disposition: A | Discharge status: Home / Self Care | Payer: Self-pay | Source: Home / Self Care | Attending: Internal Medicine

## 2020-06-13 ENCOUNTER — Inpatient Hospital Stay (HOSPITAL_COMMUNITY): Payer: Medicare Other

## 2020-06-13 ENCOUNTER — Encounter (HOSPITAL_COMMUNITY): Payer: Self-pay | Admitting: Family Medicine

## 2020-06-13 ENCOUNTER — Inpatient Hospital Stay (HOSPITAL_COMMUNITY)
Admit: 2020-06-13 | Discharge: 2020-06-13 | Disposition: A | Discharge status: Home / Self Care | Payer: Medicare Other | Attending: Physician Assistant | Admitting: Physician Assistant

## 2020-06-13 DIAGNOSIS — I340 Nonrheumatic mitral (valve) insufficiency: Secondary | ICD-10-CM | POA: Diagnosis not present

## 2020-06-13 DIAGNOSIS — R7881 Bacteremia: Secondary | ICD-10-CM | POA: Diagnosis not present

## 2020-06-13 DIAGNOSIS — B9561 Methicillin susceptible Staphylococcus aureus infection as the cause of diseases classified elsewhere: Secondary | ICD-10-CM | POA: Diagnosis not present

## 2020-06-13 DIAGNOSIS — E039 Hypothyroidism, unspecified: Secondary | ICD-10-CM

## 2020-06-13 DIAGNOSIS — A408 Other streptococcal sepsis: Secondary | ICD-10-CM | POA: Diagnosis not present

## 2020-06-13 HISTORY — PX: TEE WITHOUT CARDIOVERSION: SHX5443

## 2020-06-13 LAB — BASIC METABOLIC PANEL
Anion gap: 6 (ref 5–15)
BUN: 19 mg/dL (ref 8–23)
CO2: 25 mmol/L (ref 22–32)
Calcium: 8.7 mg/dL — ABNORMAL LOW (ref 8.9–10.3)
Chloride: 107 mmol/L (ref 98–111)
Creatinine, Ser: 1 mg/dL (ref 0.61–1.24)
GFR, Estimated: >60 mL/min (ref >60)
Glucose, Bld: 107 mg/dL — ABNORMAL HIGH (ref 70–99)
Potassium: 3.8 mmol/L (ref 3.5–5.1)
Sodium: 138 mmol/L (ref 135–145)

## 2020-06-13 LAB — CBC WITH DIFFERENTIAL/PLATELET
Abs Immature Granulocytes: 0.11 10*3/uL — ABNORMAL HIGH (ref 0.00–0.07)
Basophils Absolute: 0.1 10*3/uL (ref 0.0–0.1)
Basophils Relative: 1 %
Eosinophils Absolute: 0.4 10*3/uL (ref 0.0–0.5)
Eosinophils Relative: 4 %
HCT: 31.4 % — ABNORMAL LOW (ref 39.0–52.0)
Hemoglobin: 10.7 g/dL — ABNORMAL LOW (ref 13.0–17.0)
Immature Granulocytes: 1 %
Lymphocytes Relative: 17 %
Lymphs Abs: 1.9 10*3/uL (ref 0.7–4.0)
MCH: 30.6 pg (ref 26.0–34.0)
MCHC: 34.1 g/dL (ref 30.0–36.0)
MCV: 89.7 fL (ref 80.0–100.0)
Monocytes Absolute: 1 10*3/uL (ref 0.1–1.0)
Monocytes Relative: 9 %
Neutro Abs: 7.6 10*3/uL (ref 1.7–7.7)
Neutrophils Relative %: 68 %
Platelets: 218 10*3/uL (ref 150–400)
RBC: 3.5 MIL/uL — ABNORMAL LOW (ref 4.22–5.81)
RDW: 14.3 % (ref 11.5–15.5)
WBC: 11 10*3/uL — ABNORMAL HIGH (ref 4.0–10.5)
nRBC: 0 % (ref 0.0–0.2)

## 2020-06-13 LAB — GLUCOSE, CAPILLARY
Glucose-Capillary: 107 mg/dL — ABNORMAL HIGH (ref 70–99)
Glucose-Capillary: 120 mg/dL — ABNORMAL HIGH (ref 70–99)

## 2020-06-13 LAB — ECHO TEE: P 1/2 time: 773 msec

## 2020-06-13 SURGERY — ECHOCARDIOGRAM, TRANSESOPHAGEAL
Anesthesia: Monitor Anesthesia Care

## 2020-06-13 MED ORDER — PROPOFOL 10 MG/ML IV BOLUS
INTRAVENOUS | Status: DC | PRN
  Administered 2020-06-13: 30 mg via INTRAVENOUS

## 2020-06-13 MED ORDER — LACTATED RINGERS IV SOLN: INTRAVENOUS | Status: DC | PRN

## 2020-06-13 MED ORDER — BUTAMBEN-TETRACAINE-BENZOCAINE 2-2-14 % EX AERO
INHALATION_SPRAY | CUTANEOUS | Status: DC | PRN
  Administered 2020-06-13: 2 via TOPICAL

## 2020-06-13 MED ORDER — PROPOFOL 500 MG/50ML IV EMUL
INTRAVENOUS | Status: DC | PRN
  Administered 2020-06-13: 125 ug/kg/min via INTRAVENOUS

## 2020-06-13 MED ORDER — CEFAZOLIN IV (FOR PTA / DISCHARGE USE ONLY): 2.0000 g | Freq: Three times a day (TID) | INTRAVENOUS | 0 refills | Status: AC

## 2020-06-13 MED ORDER — LIDOCAINE HCL (CARDIAC) PF 100 MG/5ML IV SOSY
PREFILLED_SYRINGE | INTRAVENOUS | Status: DC | PRN
  Administered 2020-06-13: 60 mg via INTRATRACHEAL

## 2020-06-13 NOTE — Discharge Summary (Signed)
Physician Discharge Summary  Harry Brown ZOX:[id number] DOB: 07-Sep-1940 DOA: 06/09/2020  PCP: [name], MD  Admit date: 06/09/2020 Discharge date: 06/13/2020  Admitted From: Home  Discharge disposition: Home  Recommendations for Outpatient Follow-Up:   Follow up with your primary care provider in one week.  Check CBC, BMP, magnesium in the next visit Follow-up with infectious disease clinic as scheduled by the clinic.  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  Diet recommendation: Low sodium, heart healthy.  Carbohydrate-modified.    Wound care: None.  Code status: Full.   History of Present Illness:   Harry Brown is a 80 y.o. male with hx CAD s/p CABG, Afib on Eliquis, PVD s/p Right fem-pop in 2014, HTN, DM, and hypothyroidism who presented  to the hospital with with fever and malaise for 2 days, then abnormal blood culture positive for MSSA and so he was called and recommended to return to the ER.In the ER, he remained afebrile with normal heart rate and blood pressure.  He was started on cefazolin, and the hospitalist service were asked to evaluate for staph bacteremia.   Hospital Course:   Following conditions were addressed during hospitalization as listed below,  MSSA bacteremia-unclear source. Received  IV cefazolin.  ID on board.  Follow up with repeat blood cultures, negative in 2 days..   2D echocardiogram performed on 06/11/2020 showed normal LV function with no mention of vegetation.  TEE was performed on 06/13/20 without any vegetation. Mild leukocytosis.  T-max of 99.8 F.  Patient will receive PICC line placement for IV Ancef, plan for  total 4 weeks duration.  ID on board and arrangements will be made on discharge. Communicated with ID prior to discharge.    Urinary symptoms, likely UTI Urine culture showing more than 100,000 colonies of staph aureus.  History of incontinence, urgency.   already on cefazolin and will continue on discharge.   Coronary artery disease status CABG/Hypertension/Peripheral vascular disease status post femoropopliteal bypass No acute issues during hospitalization.  Continue  atenolol, atorvastatin   Diabetes mellitus type II, well controlled, with peripheral neuropathy Resumed Metformin on discharge  Permanent atrial fibrillation -Continue atenolol, Eliquis on discharge.   Hypothyroidism Continue Synthroid   BPH -Continue Flomax   Disposition.  At this time, patient is stable for disposition home with outpatient PCP and ID follow-up.  Spoke with the patient's spouse at bedside.  Medical Consultants:   Infectious disease  Procedures:    TEE PICC line placement  Subjective:   Today, patient was seen and examined after TEE.  Patient denies any fever chills, fever cough or shortness of breath.  Discharge Exam:   Vitals:   06/13/20 1130 06/13/20 1233  BP: (!) 131/50 (!) 142/78  Pulse: 67 75  Resp: 10 20  Temp:  99 F (37.2 C)  SpO2: 100%    Vitals:   06/13/20 1110 06/13/20 1120 06/13/20 1130 06/13/20 1233  BP: (!) 84/47 (!) 103/55 (!) 131/50 (!) 142/78  Pulse: (!) 48 67 67 75  Resp: 15 14 10 20   Temp: 98.3 F (36.8 C)   99 F (37.2 C)  TempSrc: Oral   Oral  SpO2:  97% 97% 100%   Weight:      Height:       General: Alert awake, not in obvious distress, on room air HENT: pupils equally reacting to light,  No scleral pallor or icterus noted. Oral mucosa is moist.  Chest:  Clear breath sounds.  Diminished breath sounds bilaterally. CVS: S1 &S2 heard. No murmur.  Regular rate and rhythm. Abdomen: Soft, nontender, nondistended.  Bowel sounds are heard.   Extremities:  No cyanosis, clubbing or edema.  Bilateral onychomycosis with excoriation of the leg. peripheral pulses are palpable. Psych: Alert, awake and oriented, normal mood CNS:  No cranial nerve deficits.  Power equal in all extremities.   Skin: Warm and dry.    The results of significant diagnostics from this hospitalization (including imaging, microbiology, ancillary and laboratory) are listed below for reference.     Diagnostic Studies:   ECHO TEE  Result Date: 06/13/2020    TRANSESOPHOGEAL ECHO REPORT   Patient Name:   Harry Brown Date of Exam: 06/13/2020 Medical Rec #:  [id number]        Height:       72.0 in Accession #:    [id number]       Weight:       190.0 lb Date of Birth:  1940-07-04        BSA:          2.085 m Patient Age:    58 years         BP:           142/78 mmHg Patient Gender: M                HR:           75 bpm. Exam Location:  Inpatient Procedure: Transesophageal Echo, Cardiac Doppler, Color Doppler and 3D Echo Indications:     Bacteremia  History:         Patient has prior history of Echocardiogram examinations, most                  recent 06/11/2020. Risk Factors:Diabetes, Dyslipidemia,                  Hypertension, Sleep Apnea and Former Smoker. PVD.  Sonographer:     Clayton Lefort RDCS (AE) Referring Phys:  [id number] Leanor Kail Diagnosing Phys: Eleonore Chiquito MD PROCEDURE: After discussion of the risks and benefits of a TEE, an informed consent was obtained from the patient. TEE procedure time was 21 minutes. The transesophogeal probe was passed without difficulty through the esophogus of the patient. Imaged were obtained with the patient in a left lateral decubitus position. Local oropharyngeal anesthetic was provided with Cetacaine. Sedation performed by different physician. The patient was monitored while under deep sedation. Anesthestetic sedation was provided intravenously by Anesthesiology: 266m of Propofol, 662mof Lidocaine. Image quality was excellent. The patient's  vital signs; including heart rate, blood pressure, and oxygen saturation; remained stable throughout the procedure. The patient developed no complications during the procedure. IMPRESSIONS  1. Left ventricular ejection fraction, by estimation, is 55 to 60%. The left ventricle has normal function.  2. Right ventricular systolic function is normal. The right ventricular size is normal.  3. Left atrial size was mildly dilated. No left atrial/left atrial appendage thrombus was detected.  4. The mitral valve is grossly normal. Mild mitral valve regurgitation. No evidence of mitral stenosis.  5. The aortic valve is tricuspid. There is mild calcification of the aortic  valve. There is mild thickening of the aortic valve. Aortic valve regurgitation is mild. Mild aortic valve sclerosis is present, with no evidence of aortic valve stenosis.  6. There is Severe (Grade IV) layered plaque involving the descending aorta and transverse aorta. Conclusion(s)/Recommendation(s): No evidence of vegetation/infective endocarditis on this transesophageal echocardiogram. FINDINGS  Left Ventricle: Left ventricular ejection fraction, by estimation, is 55 to 60%. The left ventricle has normal function. The left ventricular internal cavity size was normal in size. Right Ventricle: The right ventricular size is normal. No increase in right ventricular wall thickness. Right ventricular systolic function is normal. Left Atrium: Left atrial size was mildly dilated. No left atrial/left atrial appendage thrombus was detected. Right Atrium: Right atrial size was normal in size. Pericardium: There is no evidence of pericardial effusion. Mitral Valve: The mitral valve is grossly normal. Mild mitral valve regurgitation. No evidence of mitral valve stenosis. There is no evidence of mitral valve vegetation. Tricuspid Valve: The tricuspid valve is grossly normal. Tricuspid valve regurgitation is mild . No evidence of tricuspid stenosis. There is no evidence  of tricuspid valve vegetation. Aortic Valve: The aortic valve is tricuspid. There is mild calcification of the aortic valve. There is mild thickening of the aortic valve. Aortic valve regurgitation is mild. Aortic regurgitation PHT measures 773 msec. Mild aortic valve sclerosis is present, with no evidence of aortic valve stenosis. There is no evidence of aortic valve vegetation. Pulmonic Valve: The pulmonic valve was grossly normal. Pulmonic valve regurgitation is trivial. No evidence of pulmonic stenosis. There is no evidence of pulmonic valve vegetation. Aorta: The aortic root and ascending aorta are structurally normal, with no evidence of dilitation. There is severe (Grade IV) layered plaque involving the descending aorta and transverse aorta. Venous: The right upper pulmonary vein, right lower pulmonary vein, left lower pulmonary vein and left upper pulmonary vein are normal. IAS/Shunts: The atrial septum is grossly normal.  AORTIC VALVE AI PHT:      773 msec  AORTA Ao Root diam: 3.25 cm Ao Asc diam:  3.60 cm TRICUSPID VALVE TR Peak grad:   32.3 mmHg TR Vmax:        284.00 cm/s Eleonore Chiquito MD Electronically signed by Eleonore Chiquito MD Signature Date/Time: 06/13/2020/2:11:35 PM    Final    [place] EKG SITE RITE  Result Date: 06/13/2020 If Site Rite image not attached, placement could not be confirmed due to current cardiac rhythm.    Labs:   Basic Metabolic Panel: Recent Labs  Lab 06/08/20 1343 06/10/20 0428 06/11/20 0122 06/13/20 0455  NA 138 135 138 138  K 4.0 3.7 3.6 3.8  CL 104 104 104 107  CO2 23 22 23 25   GLUCOSE 121* 114* 130* 107*  BUN 30* 21 20 19   CREATININE 1.18 1.06 1.01 1.00  CALCIUM 9.3 9.0 8.8* 8.7*   GFR Estimated Creatinine Clearance: 64.7 mL/min (by C-G formula based on SCr of 1 mg/dL). Liver Function Tests: Recent Labs  Lab 06/08/20 1343 06/10/20 0428  AST 21 20  ALT 21 20  ALKPHOS 66 58  BILITOT 1.3* 1.3*  PROT 7.5 6.6  ALBUMIN 4.2 3.4*   No results for  input(s): LIPASE, AMYLASE in the last 168 hours. No results for input(s): AMMONIA in the last 168 hours. Coagulation profile Recent Labs  Lab 06/08/20 1343  INR 1.5*    CBC: Recent Labs  Lab 06/08/20 1343 06/10/20 0428 06/11/20 0122 06/13/20 0455  WBC 26.2* 17.2* 12.9* 11.0*  NEUTROABS 23.2* 13.7*  --  7.6  HGB 12.5* 11.7* 11.0* 10.7*  HCT 38.3* 35.8* 32.2* 31.4*  MCV 93.9 92.7 90.2 89.7  PLT 190 193 190 218   Cardiac Enzymes: No results for input(s): CKTOTAL, CKMB, CKMBINDEX, TROPONINI in the last 168 hours. BNP: Invalid input(s): POCBNP CBG: Recent Labs  Lab 06/11/20 0758 06/11/20 1650 06/12/20 0734 06/12/20 1727 06/13/20 0705  GLUCAP 104* 110* 110* 97 107*   D-Dimer No results for input(s): DDIMER in the last 72 hours. Hgb A1c No results for input(s): HGBA1C in the last 72 hours. Lipid Profile No results for input(s): CHOL, HDL, LDLCALC, TRIG, CHOLHDL, LDLDIRECT in the last 72 hours. Thyroid function studies No results for input(s): TSH, T4TOTAL, T3FREE, THYROIDAB in the last 72 hours.  Invalid input(s): FREET3 Anemia work up No results for input(s): VITAMINB12, FOLATE, FERRITIN, TIBC, IRON, RETICCTPCT in the last 72 hours. Microbiology Recent Results (from the past 240 hour(s))  Culture, blood (Routine x 2)     Status: Abnormal   Collection Time: 06/08/20  1:42 PM   Specimen: BLOOD  Result Value Ref Range Status   Specimen Description   Final    BLOOD RIGHT ANTECUBITAL Performed at [facility] [address], [place], [place] [address]    Special Requests   Final    BOTTLES DRAWN AEROBIC AND ANAEROBIC Blood Culture adequate volume Performed at [facility] [address], [place], [place] [address]    Culture  Setup Time   Final    GRAM POSITIVE COCCI IN CLUSTERS AEROBIC BOTTLE ONLY CRITICAL RESULT CALLED TO, READ BACK BY AND VERIFIED WITH: H. Hopkins 11:40 06/09/20 ([email or web address]) Performed at [facility], [address], [place], [place] [address]    Culture STAPHYLOCOCCUS AUREUS (A)  Final   Report Status 06/11/2020 FINAL  Final   Organism ID, Bacteria STAPHYLOCOCCUS AUREUS  Final      Susceptibility   Staphylococcus aureus - MIC*    CIPROFLOXACIN <=0.5 SENSITIVE Sensitive     ERYTHROMYCIN >=8 RESISTANT Resistant     GENTAMICIN <=0.5 SENSITIVE Sensitive     OXACILLIN 0.5 SENSITIVE Sensitive     TETRACYCLINE <=1 SENSITIVE Sensitive     VANCOMYCIN 1 SENSITIVE Sensitive     TRIMETH/SULFA <=10 SENSITIVE Sensitive     CLINDAMYCIN <=0.25 SENSITIVE Sensitive     RIFAMPIN <=0.5 SENSITIVE Sensitive     Inducible Clindamycin NEGATIVE Sensitive     * STAPHYLOCOCCUS AUREUS  Blood Culture ID Panel (Reflexed)     Status: Abnormal   Collection Time: 06/08/20  1:42 PM  Result Value Ref Range Status   Enterococcus faecalis NOT DETECTED NOT DETECTED Final   Enterococcus Faecium NOT DETECTED NOT DETECTED Final   Listeria monocytogenes NOT DETECTED NOT DETECTED Final   Staphylococcus species DETECTED (A) NOT DETECTED Final    Comment: CRITICAL RESULT CALLED TO, READ BACK BY AND VERIFIED WITH: H. Hopkins 11:40 06/09/20 ([email or web address])    Staphylococcus aureus (BCID) DETECTED (A) NOT DETECTED Final    Comment: CRITICAL RESULT CALLED TO, READ BACK BY AND VERIFIED WITH: H. Hopkins 11:40 06/09/20 ([email or web address])    Staphylococcus epidermidis NOT DETECTED NOT DETECTED Final   Staphylococcus lugdunensis NOT DETECTED NOT DETECTED Final   Streptococcus species NOT DETECTED NOT DETECTED Final   Streptococcus agalactiae NOT DETECTED NOT DETECTED Final   Streptococcus pneumoniae NOT DETECTED NOT DETECTED Final   Streptococcus pyogenes NOT DETECTED NOT DETECTED Final   A.calcoaceticus-baumannii NOT DETECTED NOT DETECTED Final   Bacteroides fragilis NOT DETECTED NOT  DETECTED Final   Enterobacterales NOT DETECTED NOT DETECTED Final   Enterobacter cloacae complex NOT DETECTED NOT DETECTED Final   Escherichia coli NOT DETECTED  NOT DETECTED Final   Klebsiella aerogenes NOT DETECTED NOT DETECTED Final   Klebsiella oxytoca NOT DETECTED NOT DETECTED Final   Klebsiella pneumoniae NOT DETECTED NOT DETECTED Final   Proteus species NOT DETECTED NOT DETECTED Final   Salmonella species NOT DETECTED NOT DETECTED Final   Serratia marcescens NOT DETECTED NOT DETECTED Final   Haemophilus influenzae NOT DETECTED NOT DETECTED Final   Neisseria meningitidis NOT DETECTED NOT DETECTED Final   Pseudomonas aeruginosa NOT DETECTED NOT DETECTED Final   Stenotrophomonas maltophilia NOT DETECTED NOT DETECTED Final   Candida albicans NOT DETECTED NOT DETECTED Final   Candida auris NOT DETECTED NOT DETECTED Final   Candida glabrata NOT DETECTED NOT DETECTED Final   Candida krusei NOT DETECTED NOT DETECTED Final   Candida parapsilosis NOT DETECTED NOT DETECTED Final   Candida tropicalis NOT DETECTED NOT DETECTED Final   Cryptococcus neoformans/gattii NOT DETECTED NOT DETECTED Final   Meth resistant mecA/C and MREJ NOT DETECTED NOT DETECTED Final    Comment: Performed at [facility], [place] [address], [place], [place] [address]  Blood culture (routine x 2)     Status: None (Preliminary result)   Collection Time: 06/10/20  4:28 AM   Specimen: BLOOD  Result Value Ref Range Status   Specimen Description BLOOD LEFT ARM  Final   Special Requests   Final    BOTTLES DRAWN AEROBIC AND ANAEROBIC Blood Culture results may not be optimal due to an excessive volume of blood received in culture bottles   Culture   Final    NO GROWTH 2 DAYS Performed at [facility], [place] [address], [place], [place] [address]    Report Status PENDING  Incomplete  Blood culture (routine x 2)     Status: None (Preliminary result)   Collection Time: 06/10/20  4:34 AM   Specimen: BLOOD  Result Value Ref Range Status   Specimen Description BLOOD RIGHT ARM  Final   Special Requests   Final    BOTTLES DRAWN AEROBIC AND ANAEROBIC Blood Culture results may  not be optimal due to an inadequate volume of blood received in culture bottles   Culture   Final    NO GROWTH 2 DAYS Performed at [facility], [place] [address], [place], [place] [address]    Report Status PENDING  Incomplete  Resp Panel by RT-PCR (Flu A&B, Covid) Nasopharyngeal Swab     Status: None   Collection Time: 06/10/20  5:30 AM   Specimen: Nasopharyngeal Swab; Nasopharyngeal(NP) swabs in vial transport medium  Result Value Ref Range Status   SARS Coronavirus 2 by RT PCR NEGATIVE NEGATIVE Final    Comment: (NOTE) SARS-CoV-2 target nucleic acids are NOT DETECTED.  The SARS-CoV-2 RNA is generally detectable in upper respiratory specimens during the acute phase of infection. The lowest concentration of SARS-CoV-2 viral copies this assay can detect is 138 copies/mL. A negative result does not preclude SARS-Cov-2 infection and should not be used as the sole basis for treatment or other patient management decisions. A negative result may occur with  improper specimen collection/handling, submission of specimen other than nasopharyngeal swab, presence of viral mutation(s) within the areas targeted by this assay, and inadequate number of viral copies(<138 copies/mL). A negative result must be combined with clinical observations, patient history, and epidemiological information. The expected result  is Negative.  Fact Sheet for Patients:  EntrepreneurPulse.com.au  Fact Sheet for Healthcare Providers:  IncredibleEmployment.be  This test is no t yet approved or cleared by the [place] FDA and  has been authorized for detection and/or diagnosis of SARS-CoV-2 by FDA under an Emergency Use Authorization (EUA). This EUA will remain  in effect (meaning this test can be used) for the duration of the COVID-19 declaration under Section 564(b)(1) of the Act, 21 U.S.C.section 360bbb-3(b)(1), unless the authorization is terminated  or revoked  sooner.       Influenza A by PCR NEGATIVE NEGATIVE Final   Influenza B by PCR NEGATIVE NEGATIVE Final    Comment: (NOTE) The Xpert Xpress SARS-CoV-2/FLU/RSV plus assay is intended as an aid in the diagnosis of influenza from Nasopharyngeal swab specimens and should not be used as a sole basis for treatment. Nasal washings and aspirates are unacceptable for Xpert Xpress SARS-CoV-2/FLU/RSV testing.  Fact Sheet for Patients: EntrepreneurPulse.com.au  Fact Sheet for Healthcare Providers: IncredibleEmployment.be  This test is not yet approved or cleared by the [place] FDA and has been authorized for detection and/or diagnosis of SARS-CoV-2 by FDA under an Emergency Use Authorization (EUA). This EUA will remain in effect (meaning this test can be used) for the duration of the COVID-19 declaration under Section 564(b)(1) of the Act, 21 U.S.C. section 360bbb-3(b)(1), unless the authorization is terminated or revoked.  Performed at [facility], [place] [address], [place], [place] [address]   Urine culture     Status: Abnormal   Collection Time: 06/10/20  8:53 AM   Specimen: Urine, Random  Result Value Ref Range Status   Specimen Description URINE, RANDOM  Final   Special Requests   Final    NONE Performed at [facility], [place] [address], [place], [place] [address]    Culture >=100,000 COLONIES/mL STAPHYLOCOCCUS AUREUS (A)  Final   Report Status 06/12/2020 FINAL  Final   Organism ID, Bacteria STAPHYLOCOCCUS AUREUS (A)  Final      Susceptibility   Staphylococcus aureus - MIC*    CIPROFLOXACIN <=0.5 SENSITIVE Sensitive     GENTAMICIN <=0.5 SENSITIVE Sensitive     NITROFURANTOIN <=16 SENSITIVE Sensitive     OXACILLIN 0.5 SENSITIVE Sensitive     TETRACYCLINE <=1 SENSITIVE Sensitive     VANCOMYCIN <=0.5 SENSITIVE Sensitive     TRIMETH/SULFA <=10 SENSITIVE Sensitive     CLINDAMYCIN <=0.25 SENSITIVE Sensitive     RIFAMPIN <=0.5  SENSITIVE Sensitive     Inducible Clindamycin NEGATIVE Sensitive     * >=100,000 COLONIES/mL STAPHYLOCOCCUS AUREUS     Discharge Instructions:   Discharge Instructions     [facility] pharmacist to adjust dose for Vancomycin, Aminoglycosides and other anti-infective therapies as requested by physician.   Complete by: As directed    [facility] to provide Cath Flo 2mg    Complete by: As directed    Administer for PICC line occlusion and as ordered by physician for other access device issues.   Anaphylaxis Kit: Provided to treat any anaphylactic reaction to the medication being provided to the patient if First Dose or when requested by physician   Complete by: As directed    Epinephrine 1mg /ml vial / amp: Administer 0.3mg  (0.40ml) subcutaneously once for moderate to severe anaphylaxis, nurse to call physician and pharmacy when reaction occurs and call 911 if needed for immediate care   Diphenhydramine 50mg /ml IV vial: Administer 25-50mg  IV/IM PRN for first dose reaction, rash,  itching, mild reaction, nurse to call physician and pharmacy when reaction occurs   Sodium Chloride 0.9% NS 591m IV: Administer if needed for hypovolemic blood pressure drop or as ordered by physician after call to physician with anaphylactic reaction   Change dressing on IV access line weekly and PRN   Complete by: As directed    Diet Carb Modified   Complete by: As directed    Discharge instructions   Complete by: As directed    Follow-up with infectious disease as has been scheduled.  Follow-up with your primary care physician in 1 to 2 weeks.  Continue to take antibiotics as prescribed.   Flush IV access with Sodium Chloride 0.9% and Heparin 10 units/ml or 100 units/ml   Complete by: As directed    Home infusion instructions - [facility]   Complete by: As directed    Instructions: Flush IV access with Sodium Chloride 0.9% and Heparin 10units/ml or 100units/ml   Change dressing  on IV access line: Weekly and PRN   Instructions Cath Flo 269m Administer for PICC Line occlusion and as ordered by physician for other access device   [facility] pharmacist to adjust dose for: Vancomycin, Aminoglycosides and other anti-infective therapies as requested by physician   Increase activity slowly   Complete by: As directed    Method of administration may be changed at the discretion of home infusion pharmacist based upon assessment of the patient and/or caregiver's ability to self-administer the medication ordered   Complete by: As directed    No wound care   Complete by: As directed       Allergies as of 06/13/2020       Reactions   Adhesive [tape] Rash   Latex Rash   Other Hives, Swelling, Rash   Plastic shopping bags Metal staples: rash, swelling        Medication List     TAKE these medications    atenolol 25 MG tablet Commonly known as: TENORMIN TAKE ONE (1) TABLET BY MOUTH EVERY DAY What changed: See the new instructions.   atorvastatin 40 MG tablet Commonly known as: LIPITOR TAKE ONE (1) TABLET BY MOUTH EACH DAY AT6PM What changed: See the new instructions.   ceFAZolin  IVPB Commonly known as: ANCEF Inject 2 g into the vein every 8 (eight) hours for 25 days. Indication:  MSSA bacteremia First Dose: Yes Last Day of Therapy:  07/08/20 Labs - Once weekly:  CBC/D and BMP, Labs - Every other week:  ESR and CRP Method of administration: IV Push Method of administration may be changed at the discretion of home infusion pharmacist based upon assessment of the patient and/or caregiver's ability to self-administer the medication ordered.   Eliquis 5 MG Tabs tablet Generic drug: apixaban TAKE ONE (1) TABLET BY MOUTH TWO (2) TIMES DAILY What changed: See the new instructions.   latanoprost 0.005 % ophthalmic solution Commonly known as: XALATAN Place 1 drop into both eyes at bedtime.   levothyroxine 75 MCG tablet Commonly known as:  SYNTHROID Take 1 tablet (75 mcg total) by mouth daily before breakfast.   loratadine 10 MG tablet Commonly known as: CLARITIN Take 10 mg by mouth daily as needed for allergies.   losartan 25 MG tablet Commonly known as: COZAAR TAKE ONE (1) TABLET BY MOUTH EVERY DAY What changed: See the new instructions.   metFORMIN 500 MG 24 hr tablet Commonly known as: GLUCOPHAGE-XR TAKE TWO (2) TABLETS BY MOUTH EVERY MORNING WITH BREAKFAST What  changed: See the new instructions.   nitroGLYCERIN 0.4 MG SL tablet Commonly known as: NITROSTAT Place 1 tablet (0.4 mg total) under the tongue every 5 (five) minutes as needed for chest pain.   OneTouch Delica Lancets 12F Misc USE TO CHECK BLOOD SUGAR ONCE DAILY   OneTouch Ultra test strip Generic drug: glucose blood USE 1 STRIP DAILY   tamsulosin 0.4 MG Caps capsule Commonly known as: FLOMAX TAKE ONE CAPSULE BY MOUTH DAILY   Vitamin D 50 MCG (2000 UT) tablet Take 2,000 Units by mouth daily.               Discharge Care Instructions  (From admission, onward)           Start     Ordered   06/13/20 0000  Change dressing on IV access line weekly and PRN  (Home infusion instructions - [facility] )        06/13/20 1241            Follow-up Information     [name], MD. Schedule an appointment as soon as possible for a visit in 1 week(s).   Specialty: Family Medicine Why: regular followup Contact information: [place] [address] [phone]                 Time coordinating discharge: 39 minutes  Signed:  Renly Roots  [facility] 06/13/2020, 2:33 PM

## 2020-06-13 NOTE — CV Procedure (Signed)
    TRANSESOPHAGEAL ECHOCARDIOGRAM   NAME:  Harry Brown    MRN: [id number] DOB:  1940-11-16    ADMIT DATE: 06/09/2020  INDICATIONS: Bacteremia   PROCEDURE:   Informed consent was obtained prior to the procedure. The risks, benefits and alternatives for the procedure were discussed and the patient comprehended these risks.  Risks include, but are not limited to, cough, sore throat, vomiting, nausea, somnolence, esophageal and stomach trauma or perforation, bleeding, low blood pressure, aspiration, pneumonia, infection, trauma to the teeth and death.    Procedural time out performed. The oropharynx was anesthetized with topical 1% benzocaine.    Anesthesia was administered by Dr. Doroteo Glassman.  265 mcg propofol and 60 mg lidocaine. The patient's heart rate, blood pressure, and oxygen saturation are monitored continuously during the procedure. The period of conscious sedation is 21 minutes, of which I was present face-to-face 100% of this time.   The transesophageal probe was inserted in the esophagus and stomach without difficulty and multiple views were obtained.   COMPLICATIONS:    There were no immediate complications.  KEY FINDINGS:  1. No evidence of infective endocarditis. 2. Low normal LVEF ~50-55%.  3. Full report to follow. 4. Further management per primary team.   Addison Naegeli. Audie Box, [place]  [address], [place], [place] [address] [phone]  10:52 AM

## 2020-06-13 NOTE — Interval H&P Note (Signed)
History and Physical Interval Note:  06/13/2020 10:09 AM  Harry Brown  has presented today for surgery, with the diagnosis of BACTEREMIA.  The various methods of treatment have been discussed with the patient and family. After consideration of risks, benefits and other options for treatment, the patient has consented to  Procedure(s): TRANSESOPHAGEAL ECHOCARDIOGRAM (TEE) (N/A) as a surgical intervention.  The patient's history has been reviewed, patient examined, no change in status, stable for surgery.  I have reviewed the patient's chart and labs.  Questions were answered to the patient's satisfaction.    TEE for bacteremia. NPO since midnight. No issues swallowing.   Harry Brown, [place]  [address] [place], [place] [address] [phone]  10:10 AM

## 2020-06-13 NOTE — Anesthesia Procedure Notes (Signed)
Procedure Name: MAC Date/Time: 06/13/2020 9:18 AM Performed by: Kathryne Hitch, CRNA Pre-anesthesia Checklist: Patient identified, Emergency Drugs available, Suction available and Patient being monitored Patient Re-evaluated:Patient Re-evaluated prior to induction Oxygen Delivery Method: Nasal cannula Preoxygenation: Pre-oxygenation with 100% oxygen Induction Type: IV induction Dental Injury: Teeth and Oropharynx as per pre-operative assessment

## 2020-06-13 NOTE — Transfer of Care (Signed)
Immediate Anesthesia Transfer of Care Note  Patient: Harry Brown  Procedure(s) Performed: TRANSESOPHAGEAL ECHOCARDIOGRAM (TEE) (N/A )  Patient Location: Endoscopy Unit  Anesthesia Type:MAC  Level of Consciousness: drowsy and patient cooperative  Airway & Oxygen Therapy: Patient Spontanous Breathing and Patient connected to face mask oxygen  Post-op Assessment: Report given to RN and Post -op Vital signs reviewed and stable  Post vital signs: Reviewed and stable  Last Vitals:  Vitals Value Taken Time  BP 91/39 06/13/20 1059  Temp    Pulse 57 06/13/20 1100  Resp 18 06/13/20 1100  SpO2 96 % 06/13/20 1100  Vitals shown include unvalidated device data.  Last Pain:  Vitals:   06/13/20 0953  TempSrc:   PainSc: 0-No pain         Complications: No complications documented.

## 2020-06-13 NOTE — Progress Notes (Signed)
  Echocardiogram Echocardiogram Transesophageal has been performed.  Harry Brown 06/13/2020, 1:26 PM

## 2020-06-13 NOTE — Anesthesia Procedure Notes (Signed)
Procedure Name: MAC Date/Time: 06/13/2020 10:27 AM Performed by: Kathryne Hitch, CRNA Pre-anesthesia Checklist: Patient identified, Emergency Drugs available, Suction available and Patient being monitored Patient Re-evaluated:Patient Re-evaluated prior to induction Oxygen Delivery Method: Nasal cannula Preoxygenation: Pre-oxygenation with 100% oxygen Dental Injury: Teeth and Oropharynx as per pre-operative assessment

## 2020-06-13 NOTE — Anesthesia Preprocedure Evaluation (Addendum)
Anesthesia Evaluation  Patient identified by MRN, date of birth, ID band Patient awake    Reviewed: Allergy & Precautions, NPO status , Patient's Chart, lab work & pertinent test results  Airway Mallampati: I  TM Distance: >3 FB Neck ROM: Full    Dental  (+) Edentulous Upper, Edentulous Lower   Pulmonary sleep apnea (s/p palatoplasty) , former smoker,  75 pack year history   Pulmonary exam normal breath sounds clear to auscultation       Cardiovascular hypertension, Pt. on medications + CAD, + Past MI, + CABG (2001) and + Peripheral Vascular Disease  + dysrhythmias Atrial Fibrillation + Valvular Problems/Murmurs (mild MR, mild-mod AI) AI and MR  Rhythm:Irregular Rate:Normal  TTE 06/11/20: 1. Left ventricular ejection fraction, by estimation, is 60 to 65%. The  left ventricle has normal function. The left ventricle has no regional  wall motion abnormalities. Left ventricular diastolic function could not  be evaluated.  2. Right ventricular systolic function was not well visualized. The right  ventricular size is normal. There is normal pulmonary artery systolic  pressure.  3. The mitral valve is degenerative. Mild mitral valve regurgitation. No  evidence of mitral stenosis.  4. Tricuspid valve regurgitation is mild to moderate.  5. The aortic valve is tricuspid. There is moderate calcification of the  aortic valve. There is moderate thickening of the aortic valve. Aortic  valve regurgitation is mild to moderate. Mild to moderate aortic valve  sclerosis/calcification is present,  without any evidence of aortic stenosis.  6. Aortic dilatation noted. There is mild dilatation of the ascending  aorta, measuring 40 mm.  7. The inferior vena cava is dilated in size with >50% respiratory  variability, suggesting right atrial pressure of 8 mmHg.  8. Suboptimol images limits ability to rule out vegetation. Consider TEE  if  clinically indicated.    Neuro/Psych negative neurological ROS  negative psych ROS   GI/Hepatic Neg liver ROS, Diverticulosis    Endo/Other  diabetes, Well Controlled, Type 2, Oral Hypoglycemic AgentsHypothyroidism Last a1c 6.5  Renal/GU negative Renal ROS  negative genitourinary   Musculoskeletal negative musculoskeletal ROS (+)   Abdominal   Peds  Hematology  (+) Blood dyscrasia, anemia , hct 31.4, plt 218   Anesthesia Other Findings MSSA bactermia, unknown source  Reproductive/Obstetrics negative OB ROS                            Anesthesia Physical Anesthesia Plan  ASA: III  Anesthesia Plan: MAC   Post-op Pain Management:    Induction:   PONV Risk Score and Plan: 2 and Propofol infusion and TIVA  Airway Management Planned: Natural Airway and Simple Face Mask  Additional Equipment: None  Intra-op Plan:   Post-operative Plan:   Informed Consent:   Plan Discussed with:   Anesthesia Plan Comments:         Anesthesia Quick Evaluation

## 2020-06-13 NOTE — Care Management Important Message (Signed)
Important Message  Patient Details  Name: Harry Brown MRN: [id number] Date of Birth: 07-15-1940   Medicare Important Message Given:  Yes     Orbie Pyo 06/13/2020, 1:54 PM

## 2020-06-13 NOTE — Progress Notes (Signed)
Nsg Discharge Note  Admit Date:  06/09/2020 Discharge date: 06/13/2020   Harry Brown to be D/C'd home with home health per MD order.  AVS completed.  Copy for chart, and copy for patient signed, and dated. Patient/caregiver able to verbalize understanding.  Discharge Medication: Allergies as of 06/13/2020      Reactions   Adhesive [tape] Rash   Latex Rash   Other Hives, Swelling, Rash   Plastic shopping bags Metal staples: rash, swelling      Medication List    TAKE these medications   atenolol 25 MG tablet Commonly known as: TENORMIN TAKE ONE (1) TABLET BY MOUTH EVERY DAY What changed: See the new instructions.   atorvastatin 40 MG tablet Commonly known as: LIPITOR TAKE ONE (1) TABLET BY MOUTH EACH DAY AT6PM What changed: See the new instructions.   ceFAZolin  IVPB Commonly known as: ANCEF Inject 2 g into the vein every 8 (eight) hours for 25 days. Indication:  MSSA bacteremia First Dose: Yes Last Day of Therapy:  07/08/20 Labs - Once weekly:  CBC/D and BMP, Labs - Every other week:  ESR and CRP Method of administration: IV Push Method of administration may be changed at the discretion of home infusion pharmacist based upon assessment of the patient and/or caregiver's ability to self-administer the medication ordered.   Eliquis 5 MG Tabs tablet Generic drug: apixaban TAKE ONE (1) TABLET BY MOUTH TWO (2) TIMES DAILY What changed: See the new instructions.   latanoprost 0.005 % ophthalmic solution Commonly known as: XALATAN Place 1 drop into both eyes at bedtime.   levothyroxine 75 MCG tablet Commonly known as: SYNTHROID Take 1 tablet (75 mcg total) by mouth daily before breakfast.   loratadine 10 MG tablet Commonly known as: CLARITIN Take 10 mg by mouth daily as needed for allergies.   losartan 25 MG tablet Commonly known as: COZAAR TAKE ONE (1) TABLET BY MOUTH EVERY DAY What changed: See the new instructions.   metFORMIN 500 MG 24 hr tablet Commonly known  as: GLUCOPHAGE-XR TAKE TWO (2) TABLETS BY MOUTH EVERY MORNING WITH BREAKFAST What changed: See the new instructions.   nitroGLYCERIN 0.4 MG SL tablet Commonly known as: NITROSTAT Place 1 tablet (0.4 mg total) under the tongue every 5 (five) minutes as needed for chest pain.   OneTouch Delica Lancets 26Z Misc USE TO CHECK BLOOD SUGAR ONCE DAILY   OneTouch Ultra test strip Generic drug: glucose blood USE 1 STRIP DAILY   tamsulosin 0.4 MG Caps capsule Commonly known as: FLOMAX TAKE ONE CAPSULE BY MOUTH DAILY   Vitamin D 50 MCG (2000 UT) tablet Take 2,000 Units by mouth daily.            Discharge Care Instructions  (From admission, onward)         Start     Ordered   06/13/20 0000  Change dressing on IV access line weekly and PRN  (Home infusion instructions - [facility] )        06/13/20 1241          Discharge Assessment: Vitals:   06/13/20 1130 06/13/20 1233  BP: (!) 131/50 (!) 142/78  Pulse: 67 75  Resp: 10 20  Temp:  99 F (37.2 C)  SpO2: 100% 99%   Skin clean, dry and intact without evidence of skin break down, no evidence of skin tears noted. IV catheter discontinued intact. Site without signs and symptoms of complications - no redness or edema noted at  insertion site, patient denies c/o pain - only slight tenderness at site.  Dressing with slight pressure applied.  D/c Instructions-Education: Discharge instructions given to patient/family with verbalized understanding. D/c education completed with patient/family including follow up instructions, medication list, d/c activities limitations if indicated, with other d/c instructions as indicated by MD - patient able to verbalize understanding, all questions fully answered. Patient instructed to return to ED, call 911, or call MD for any changes in condition.  Patient escorted via [place], and D/C home via private auto.  Atilano Ina, RN 06/13/2020 6:46 PM

## 2020-06-13 NOTE — TOC Progression Note (Addendum)
Transition of Care [facility]) - Progression Note    Patient Details  Name: Harry Brown MRN: [id number] Date of Birth: 1940/12/29  Transition of Care [facility]) CM/SW Contact  Jacalyn Lefevre Edson Snowball, RN Phone Number: 06/13/2020, 1:07 PM  Clinical Narrative:     If PICC placed today patient can be discharged. Messaged Pam with [facility]. She is arranging [facility] RN with [facility].   Pam has spoken to patient and wife and will provide education today in patient's hospital room at 4 pm.  If PICC placed today and patient discharged today [facility] will will see tomorrow at 2 pm. But if discharged tomorrow try to get home by 2pm to keep the 2 pm visit , otherwise RN not available until Monday.   Expected Discharge Plan: [place]    Expected Discharge Plan and Services Expected Discharge Plan: [place]   Discharge Planning Services: CM Consult Post Acute Care Choice: [place] arrangements for the past 2 months: Single Family Home                   DME Agency: NA       HH Arranged: RN [place] Agency: [place] Date [facility] Agency Contacted: 06/12/20 Time [place]: 1208 Representative spoke with at [place]: Tommi Rumps , waiting on determination   Social Determinants of Health (SDOH) Interventions    Readmission Risk Interventions No flowsheet data found.

## 2020-06-13 NOTE — Progress Notes (Signed)
[facility] for Infectious Disease  Date of Admission:  06/09/2020           Reason for visit: Follow up on MSSA bacteremia  Current antibiotics: Day 4 cefazolin (06/09/2020--present)  ASSESSMENT:    # MSSA bacteremia Unclear etiology of bacteremia given lack of sx and no metastatic sites by exam or history.  Repeat Cx 1/4 NGTD.  Will plan for 4 weeks abx given no IE on TEE.   PLAN:    --See OPAT note below.  Okay from ID standpoint to DC once PICC and OPAT set up   . Diagnosis: MSSA Bacteremia  Culture Result: MSSA  Allergies  Allergen Reactions  . Adhesive [Tape] Rash  . Latex Rash  . Other Hives, Swelling and Rash    Plastic shopping bags Metal staples: rash, swelling    OPAT Orders Discharge antibiotics to be given via PICC line Discharge antibiotics: Cefazolin 2gm q8h  Per pharmacy protocol   Duration: 4 weeks  End Date: 07/08/20  [facility] Care Per Protocol:  Home health RN for IV administration and teaching; PICC line care and labs.    Labs weekly while on IV antibiotics: _x_ CBC with differential _x_ BMP __ CMP __ CRP __ ESR __ Vancomycin trough __ CK  _x_ Please pull PIC at completion of IV antibiotics __ Please leave PIC in place until doctor has seen patient or been notified  Fax weekly labs to [phone]  Clinic Follow Up Appt: 07/01/20 @ 145pm with Dr Juleen China  MEDICATIONS:    Scheduled Meds: . apixaban  5 mg Oral BID  . atenolol  25 mg Oral Daily  . atorvastatin  40 mg Oral Daily  . insulin aspart  0-15 Units Subcutaneous BID AC  . latanoprost  1 drop Both Eyes QHS  . levothyroxine  75 mcg Oral QAC breakfast  . losartan  25 mg Oral Daily  . tamsulosin  0.4 mg Oral Daily    Continuous Infusions: .  ceFAZolin (ANCEF) IV 2 g (06/13/20 0622)    PRN Meds: acetaminophen **OR** acetaminophen, ondansetron **OR** ondansetron (ZOFRAN) IV   INTERVAL EVENTS:   No events noted.  TEE negative.   SUBJECTIVE:   No new  complaints.  Tolerated TEE without issues. Looking forward to going home.   Review of Systems  Constitutional: Negative.   Respiratory: Negative.   Cardiovascular: Negative.   Gastrointestinal: Negative.   All other systems reviewed and are negative.    OBJECTIVE:   Allergies  Allergen Reactions  . Adhesive [Tape] Rash  . Latex Rash  . Other Hives, Swelling and Rash    Plastic shopping bags Metal staples: rash, swelling    Blood pressure (!) 142/78, pulse 75, temperature 99 F (37.2 C), temperature source Oral, resp. rate 20, height 6' (1.829 m), weight 88.5 kg, SpO2 100 %. Body mass index is 26.45 kg/m.  Physical Exam Constitutional:      General: He is not in acute distress.    Appearance: Normal appearance.  HENT:     Head: Normocephalic and atraumatic.     Mouth/Throat:     Comments: Fever blister of upper lip. Musculoskeletal:        General: Swelling present.     Comments: Swelling of right arm at site of prior PIV.  No warmth or erythema.   Neurological:     General: No focal deficit present.     Mental Status: He is alert and oriented to person, place, and  time.  Psychiatric:        Mood and Affect: Mood normal.        Behavior: Behavior normal.       Lab Results & Microbiology Lab Results  Component Value Date   WBC 11.0 (H) 06/13/2020   HGB 10.7 (L) 06/13/2020   HCT 31.4 (L) 06/13/2020   MCV 89.7 06/13/2020   PLT 218 06/13/2020    Lab Results  Component Value Date   NA 138 06/13/2020   K 3.8 06/13/2020   CO2 25 06/13/2020   GLUCOSE 107 (H) 06/13/2020   BUN 19 06/13/2020   CREATININE 1.00 06/13/2020   CALCIUM 8.7 (L) 06/13/2020   GFRNONAA >60 06/13/2020   GFRAA >60 11/22/2019    Lab Results  Component Value Date   ALT 20 06/10/2020   AST 20 06/10/2020   ALKPHOS 58 06/10/2020   BILITOT 1.3 (H) 06/10/2020     I have reviewed the micro and lab results in Epic.  Imaging ECHOCARDIOGRAM COMPLETE  Result Date: 06/11/2020     ECHOCARDIOGRAM REPORT   Patient Name:   Harry Brown Date of Exam: 06/11/2020 Medical Rec #:  [id number]        Height:       72.0 in Accession #:    [id number]       Weight:       190.0 lb Date of Birth:  1941-05-19        BSA:          2.085 m Patient Age:    80 years         BP:           124/53 mmHg Patient Gender: M                HR:           54 bpm. Exam Location:  Inpatient Procedure: 2D Echo Indications:    bacteremia  History:        Patient has prior history of Echocardiogram examinations, most                 recent 01/04/2020. Prior CABG, Arrythmias:Atrial Fibrillation;                 Risk Factors:Diabetes, Hypertension and Sleep Apnea.  Sonographer:    Johny Chess Referring Phys: [id number] CHRISTOPHER P DANFORD IMPRESSIONS  1. Left ventricular ejection fraction, by estimation, is 60 to 65%. The left ventricle has normal function. The left ventricle has no regional wall motion abnormalities. Left ventricular diastolic function could not be evaluated.  2. Right ventricular systolic function was not well visualized. The right ventricular size is normal. There is normal pulmonary artery systolic pressure.  3. The mitral valve is degenerative. Mild mitral valve regurgitation. No evidence of mitral stenosis.  4. Tricuspid valve regurgitation is mild to moderate.  5. The aortic valve is tricuspid. There is moderate calcification of the aortic valve. There is moderate thickening of the aortic valve. Aortic valve regurgitation is mild to moderate. Mild to moderate aortic valve sclerosis/calcification is present, without any evidence of aortic stenosis.  6. Aortic dilatation noted. There is mild dilatation of the ascending aorta, measuring 40 mm.  7. The inferior vena cava is dilated in size with >50% respiratory variability, suggesting right atrial pressure of 8 mmHg.  8. Suboptimol images limits ability to rule out vegetation. Consider TEE if clinically indicated. FINDINGS  Left Ventricle: Left  ventricular ejection fraction, by estimation, is 60  to 65%. The left ventricle has normal function. The left ventricle has no regional wall motion abnormalities. The left ventricular internal cavity size was normal in size. There is  no left ventricular hypertrophy. Left ventricular diastolic function could not be evaluated due to atrial fibrillation. Left ventricular diastolic function could not be evaluated. Right Ventricle: The right ventricular size is normal. No increase in right ventricular wall thickness. Right ventricular systolic function was not well visualized. There is normal pulmonary artery systolic pressure. The tricuspid regurgitant velocity is  2.57 m/s, and with an assumed right atrial pressure of 8 mmHg, the estimated right ventricular systolic pressure is 34.4 mmHg. Left Atrium: Left atrial size was normal in size. Right Atrium: Right atrial size was normal in size. Pericardium: There is no evidence of pericardial effusion. Mitral Valve: The mitral valve is degenerative in appearance. There is mild thickening of the mitral valve leaflet(s). Mild to moderate mitral annular calcification. Mild mitral valve regurgitation. No evidence of mitral valve stenosis. Tricuspid Valve: The tricuspid valve is normal in structure. Tricuspid valve regurgitation is mild to moderate. No evidence of tricuspid stenosis. Aortic Valve: The aortic valve is tricuspid. There is moderate calcification of the aortic valve. There is moderate thickening of the aortic valve. Aortic valve regurgitation is mild to moderate. Mild to moderate aortic valve sclerosis/calcification is present, without any evidence of aortic stenosis. Pulmonic Valve: The pulmonic valve was normal in structure. Pulmonic valve regurgitation is not visualized. No evidence of pulmonic stenosis. Aorta: Aortic dilatation noted. There is mild dilatation of the ascending aorta, measuring 40 mm. Venous: The inferior vena cava is dilated in size with greater  than 50% respiratory variability, suggesting right atrial pressure of 8 mmHg. IAS/Shunts: No atrial level shunt detected by color flow Doppler.  LEFT VENTRICLE PLAX 2D LVIDd:         5.10 cm LVIDs:         3.60 cm LV PW:         1.10 cm LV IVS:        0.90 cm LVOT diam:     2.00 cm LV SV:         58 LV SV Index:   28 LVOT Area:     3.14 cm  RIGHT VENTRICLE             IVC RV S prime:     10.70 cm/s  IVC diam: 2.50 cm TAPSE (M-mode): 1.3 cm LEFT ATRIUM             Index       RIGHT ATRIUM           Index LA diam:        4.20 cm 2.01 cm/m  RA Area:     13.80 cm LA Vol (A2C):   54.2 ml 26.00 ml/m RA Volume:   31.50 ml  15.11 ml/m LA Vol (A4C):   78.8 ml 37.80 ml/m LA Biplane Vol: 68.3 ml 32.76 ml/m  AORTIC VALVE LVOT Vmax:   75.40 cm/s LVOT Vmean:  56.600 cm/s LVOT VTI:    0.186 m  AORTA Ao Root diam: 3.30 cm Ao Asc diam:  4.00 cm TRICUSPID VALVE TR Peak grad:   26.4 mmHg TR Vmax:        257.00 cm/s  SHUNTS Systemic VTI:  0.19 m Systemic Diam: 2.00 cm Armanda Magic MD Electronically signed by Armanda Magic MD Signature Date/Time: 06/11/2020/5:20:21 PM    Final    [place] EKG SITE RITE  Result Date: 06/13/2020 If Site Rite image not attached, placement could not be confirmed due to current cardiac rhythm.     Rayn[facility] [phone] pager 06/13/2020, 12:39 PM

## 2020-06-13 NOTE — Progress Notes (Signed)
PHARMACY CONSULT NOTE FOR:  OUTPATIENT  PARENTERAL ANTIBIOTIC THERAPY (OPAT)  Indication: MSSA bacteremia Regimen: Cefazolin 2g q8hr End date: 07/08/20  IV antibiotic discharge orders are pended. To discharging provider:  please sign these orders via discharge navigator,  Select New Orders & click on the button choice - Manage This Unsigned Work.     Thank you for allowing pharmacy to be a part of this patient's care.  Esmond Plants 06/13/2020, 11:38 AM

## 2020-06-13 NOTE — Progress Notes (Addendum)
Peripherally Inserted Central Catheter Placement  The IV Nurse has discussed with the patient and/or persons authorized to consent for the patient, the purpose of this procedure and the potential benefits and risks involved with this procedure.  The benefits include less needle sticks, lab draws from the catheter, and the patient may be discharged home with the catheter. Risks include, but not limited to, infection, bleeding, blood clot (thrombus formation), and puncture of an artery; nerve damage and irregular heartbeat and possibility to perform a PICC exchange if needed/ordered by physician.  Alternatives to this procedure were also discussed.  Bard Power PICC patient education guide, fact sheet on infection prevention and patient information card has been provided to patient /or left at bedside.  Telephone consent obtained from daughter, Arlington Calix, due to patient receiving anesthesia <24 hours ago.  Explanation given at bedside in front of patient and wife.  Questions answered.  PICC Placement Documentation  PICC Single Lumen 98/33/82 PICC Right Basilic 42 cm 0 cm (Active)  Indication for Insertion or Continuance of Line Home intravenous therapies (PICC only) 06/13/20 1843  Exposed Catheter (cm) 0 cm 06/13/20 1843  Site Assessment Clean;Dry;Intact 06/13/20 1843  Line Status Flushed;Saline locked;Blood return noted 06/13/20 1843  Dressing Type Transparent 06/13/20 1843  Dressing Status Clean;Dry;Intact 06/13/20 1843  Antimicrobial disc in place? Yes 06/13/20 1843  Safety Lock Not Applicable 50/53/97 6734  Line Care Connections checked and tightened 06/13/20 1843  Dressing Intervention New dressing 06/13/20 1843  Dressing Change Due 06/20/20 06/13/20 1843       Chinedu Agustin, Nicolette Bang 06/13/2020, 6:43 PM

## 2020-06-13 NOTE — Anesthesia Postprocedure Evaluation (Signed)
Anesthesia Post Note  Patient: Harry Brown  Procedure(s) Performed: TRANSESOPHAGEAL ECHOCARDIOGRAM (TEE) (N/A )     Patient location during evaluation: PACU Anesthesia Type: MAC Level of consciousness: awake and alert Pain management: pain level controlled Vital Signs Assessment: post-procedure vital signs reviewed and stable Respiratory status: spontaneous breathing, nonlabored ventilation and respiratory function stable Cardiovascular status: blood pressure returned to baseline and stable Postop Assessment: no apparent nausea or vomiting Anesthetic complications: no   No complications documented.  Last Vitals:  Vitals:   06/13/20 1120 06/13/20 1130  BP: (!) 103/55 (!) 131/50  Pulse: 67 67  Resp: 14 10  Temp:    SpO2: 97% 100%    Last Pain:  Vitals:   06/13/20 1130  TempSrc:   PainSc: 0-No pain                 Pervis Hocking

## 2020-06-14 ENCOUNTER — Encounter (HOSPITAL_COMMUNITY): Payer: Self-pay | Admitting: Cardiovascular Disease

## 2020-06-14 DIAGNOSIS — A408 Other streptococcal sepsis: Secondary | ICD-10-CM | POA: Diagnosis not present

## 2020-06-14 DIAGNOSIS — R7881 Bacteremia: Secondary | ICD-10-CM | POA: Diagnosis not present

## 2020-06-14 DIAGNOSIS — R202 Paresthesia of skin: Secondary | ICD-10-CM | POA: Diagnosis not present

## 2020-06-15 DIAGNOSIS — A408 Other streptococcal sepsis: Secondary | ICD-10-CM | POA: Diagnosis not present

## 2020-06-15 LAB — CULTURE, BLOOD (ROUTINE X 2)
Culture: NO GROWTH
Culture: NO GROWTH

## 2020-06-16 ENCOUNTER — Telehealth: Payer: Self-pay

## 2020-06-16 DIAGNOSIS — A408 Other streptococcal sepsis: Secondary | ICD-10-CM | POA: Diagnosis not present

## 2020-06-16 NOTE — Telephone Encounter (Signed)
Transition Care Management Follow-up Telephone Call  Date of discharge and from where: 06/13/2020-[place]  How have you been since you were released from the hospital? Good  Any questions or concerns? No  Items Reviewed:  Did the pt receive and understand the discharge instructions provided? Yes   Medications obtained and verified? Yes   Other? Yes   Any new allergies since your discharge? No   Dietary orders reviewed? Yes  Do you have support at home? Yes   Home Care and Equipment/Supplies: Were home health services ordered? Yes If so, what is the name of the agency? [facility] Has the agency set up a time to come to the patient's home? yes Were any new equipment or medical supplies ordered?  No What is the name of the medical supply agency? n/a Were you able to get the supplies/equipment? not applicable Do you have any questions related to the use of the equipment or supplies? N/A  Functional Questionnaire: (I = Independent and D = Dependent) ADLs: I  Bathing/Dressing- I  Meal Prep- I  Eating- I  Maintaining continence- I  Transferring/Ambulation- I  Managing Meds- I  Follow up appointments reviewed:   PCP Hospital f/u appt confirmed? Yes  Scheduled to see Mackie Pai on 06/18/2020 @ 10:40.  [facility] f/u appt confirmed? Yes  Scheduled to see Dr. Juleen China on 07/01/20 @ 1:40.  Are transportation arrangements needed? No   If their condition worsens, is the pt aware to call PCP or go to the Emergency Dept.? Yes  Was the patient provided with contact information for the PCP's office or ED? Yes  Was to pt encouraged to call back with questions or concerns? Yes

## 2020-06-17 DIAGNOSIS — R202 Paresthesia of skin: Secondary | ICD-10-CM | POA: Diagnosis not present

## 2020-06-17 DIAGNOSIS — E118 Type 2 diabetes mellitus with unspecified complications: Secondary | ICD-10-CM | POA: Diagnosis not present

## 2020-06-17 DIAGNOSIS — I4891 Unspecified atrial fibrillation: Secondary | ICD-10-CM | POA: Diagnosis not present

## 2020-06-17 DIAGNOSIS — R7881 Bacteremia: Secondary | ICD-10-CM | POA: Diagnosis not present

## 2020-06-17 DIAGNOSIS — A408 Other streptococcal sepsis: Secondary | ICD-10-CM | POA: Diagnosis not present

## 2020-06-18 ENCOUNTER — Other Ambulatory Visit: Payer: Self-pay

## 2020-06-18 ENCOUNTER — Ambulatory Visit (INDEPENDENT_AMBULATORY_CARE_PROVIDER_SITE_OTHER): Payer: Medicare Other | Admitting: Medical

## 2020-06-18 VITALS — BP 135/62 | HR 91 | Resp 18 | Ht 72.0 in | Wt 199.2 lb

## 2020-06-18 DIAGNOSIS — Z862 Personal history of diseases of the blood and blood-forming organs and certain disorders involving the immune mechanism: Secondary | ICD-10-CM

## 2020-06-18 DIAGNOSIS — E1122 Type 2 diabetes mellitus with diabetic chronic kidney disease: Secondary | ICD-10-CM

## 2020-06-18 DIAGNOSIS — R7881 Bacteremia: Secondary | ICD-10-CM

## 2020-06-18 DIAGNOSIS — B9561 Methicillin susceptible Staphylococcus aureus infection as the cause of diseases classified elsewhere: Secondary | ICD-10-CM

## 2020-06-18 DIAGNOSIS — I10 Essential (primary) hypertension: Secondary | ICD-10-CM | POA: Diagnosis not present

## 2020-06-18 DIAGNOSIS — I4821 Permanent atrial fibrillation: Secondary | ICD-10-CM | POA: Diagnosis not present

## 2020-06-18 DIAGNOSIS — N390 Urinary tract infection, site not specified: Secondary | ICD-10-CM

## 2020-06-18 DIAGNOSIS — A408 Other streptococcal sepsis: Secondary | ICD-10-CM | POA: Diagnosis not present

## 2020-06-18 LAB — CBC WITH DIFFERENTIAL/PLATELET
Basophils Absolute: 0.1 10*3/uL (ref 0.0–0.1)
Basophils Relative: 0.5 % (ref 0.0–3.0)
Eosinophils Absolute: 0.4 10*3/uL (ref 0.0–0.7)
Eosinophils Relative: 3.7 % (ref 0.0–5.0)
HCT: 36 % — ABNORMAL LOW (ref 39.0–52.0)
Hemoglobin: 11.6 g/dL — ABNORMAL LOW (ref 13.0–17.0)
Lymphocytes Relative: 15.6 % (ref 12.0–46.0)
Lymphs Abs: 1.8 10*3/uL (ref 0.7–4.0)
MCHC: 32.1 g/dL (ref 30.0–36.0)
MCV: 91.8 fl (ref 78.0–100.0)
Monocytes Absolute: 0.8 10*3/uL (ref 0.1–1.0)
Monocytes Relative: 7.1 % (ref 3.0–12.0)
Neutro Abs: 8.7 10*3/uL — ABNORMAL HIGH (ref 1.4–7.7)
Neutrophils Relative %: 73.1 % (ref 43.0–77.0)
Platelets: 272 10*3/uL (ref 150.0–400.0)
RBC: 3.93 Mil/uL — ABNORMAL LOW (ref 4.22–5.81)
RDW: 14.6 % (ref 11.5–15.5)
WBC: 11.9 10*3/uL — ABNORMAL HIGH (ref 4.0–10.5)

## 2020-06-18 LAB — MAGNESIUM: Magnesium: 2 mg/dL (ref 1.5–2.5)

## 2020-06-18 LAB — COMPREHENSIVE METABOLIC PANEL
ALT: 11 U/L (ref 0–53)
AST: 27 U/L (ref 0–37)
Albumin: 3.9 g/dL (ref 3.5–5.2)
Alkaline Phosphatase: 88 U/L (ref 39–117)
BUN: 23 mg/dL (ref 6–23)
CO2: 28 mEq/L (ref 19–32)
Calcium: 9.2 mg/dL (ref 8.4–10.5)
Chloride: 104 mEq/L (ref 96–112)
Creatinine, Ser: 0.88 mg/dL (ref 0.40–1.50)
GFR: 81.18 mL/min (ref >60.00)
Glucose, Bld: 114 mg/dL — ABNORMAL HIGH (ref 70–99)
Potassium: 4.3 mEq/L (ref 3.5–5.1)
Sodium: 138 mEq/L (ref 135–145)
Total Bilirubin: 0.5 mg/dL (ref 0.2–1.2)
Total Protein: 6.7 g/dL (ref 6.0–8.3)

## 2020-06-18 NOTE — Progress Notes (Signed)
Subjective:    Patient ID: REYMUNDO WINSHIP, male    DOB: 09/24/1940, 80 y.o.   MRN: [id number]  HPI  Pt in for follow up.  Admit date: 06/09/2020 Discharge date: 06/13/2020  Admitted From: Home  Discharge disposition: Home  Recommendations for Outpatient Follow-Up:    Follow up with your primary care provider in one week.   Check CBC, BMP, magnesium in the next visit  Follow-up with infectious disease clinic as scheduled by the clinic.  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  Diet recommendation: Low sodium, heart healthy.  Carbohydrate-modified.    Wound care: None.  Code status: Full.   History of Present Illness:   Dov Dill Cooperis a 80 y.o.malewith hx CAD s/p CABG, Afib on Eliquis, PVD s/p Right fem-pop in 2014, HTN, DM, and hypothyroidismwho presentedto the hospital with with fever and malaisefor 2 days, then abnormal blood culture positive for MSSA and so he was called and recommended to return to the ER.In the ER,he remained afebrile with normal heart rate and blood pressure. He was started on cefazolin, and the hospitalist service were asked to evaluatefor staph bacteremia.  Hospital Course:   Following conditions were addressed during hospitalization as listed below,  MSSA bacteremia-unclear source. Received  IV cefazolin. ID on board. Follow up with repeatblood cultures,negative in 2 days.. 2D echocardiogramperformed on 06/11/2020 showed normal LV function with no mention of vegetation.  TEE was performed on 06/13/20 without any vegetation. Mild leukocytosis. T-max of 99.8 F. Patient will receive PICC line placement for  IV Ancef, plan for total 4 weeks duration.  ID on board and arrangements will be made on discharge. Communicated with ID prior to discharge.  Urinary symptoms, likely UTI Urine culture showing more than 100,000 colonies of staph aureus.History of incontinence,urgency. already on cefazolin and will continue on discharge.  Coronary artery disease status CABG/Hypertension/Peripheral vascular disease status post femoropopliteal bypass No acute issues during hospitalization. Continue atenolol, atorvastatin  Diabetes mellitus type II, well controlled, with peripheral neuropathy Resumed Metformin on discharge  Permanent atrial fibrillation -Continue atenolol,Eliquis on discharge.   Hypothyroidism Continue Synthroid  BPH -Continue Flomax  Disposition.  At this time, patient is stable for disposition home with outpatient PCP and ID follow-up.  Spoke with the patient's spouse at bedside.  Pt states since DC no fever, no chills or sweats. He has good energy.  Pt states ID plans on giving antibiotic for 3.5 weeks.   Has ID follow up on 07-01-2020.  DC instuction regarding antibiotics. [facility] pharmacist to adjust dose for Vancomycin, Aminoglycosides and other anti-infective therapies as requested by physician.   Complete by: As directed     [facility] to provide Cath Flo 76m   Complete by: As directed    Administer for PICC line occlusion and as ordered by physician for other access device issues.   Anaphylaxis Kit: Provided to treat any anaphylactic reaction to the medication being provided to the patient if First Dose or when requested by physician        Medical Consultants:    Infectious disease  Procedures:     TEE  PICC line placement  Subjective:  Today, patient was seen and examined after TEE.  Patient denies any fever chills, fever cough or shortness of breath.   Review of Systems  Constitutional: Negative for  chills, fatigue and fever.  HENT: Negative for congestion, drooling, ear discharge and ear pain.   Respiratory: Negative for cough, chest tightness, shortness of breath and wheezing.   Cardiovascular: Negative for chest pain and palpitations.  Gastrointestinal: Negative for abdominal pain, blood in stool and nausea.  Genitourinary: Negative for dysuria, frequency, hematuria and penile pain.  Musculoskeletal: Negative for back pain, gait problem and myalgias.  Skin: Negative for rash.  Neurological: Negative for dizziness, speech difficulty, weakness, numbness and headaches.  Hematological: Negative for adenopathy. Does not bruise/bleed easily.  Psychiatric/Behavioral: Negative for behavioral problems and confusion.       Objective:   Physical Exam  General Mental Status- Alert. General Appearance- Not in acute distress.   Skin General: Color- Normal Color. Moisture- Normal Moisture.  Neck Carotid Arteries- Normal color. Moisture- Normal Moisture. No carotid bruits. No JVD.  Chest and Lung Exam Auscultation: Breath Sounds:-Normal.  Cardiovascular Auscultation:Rythm- Regular. Murmurs & Other Heart Sounds:Auscultation of the heart reveals- No Murmurs.  Abdomen Inspection:-Inspeection Normal. Palpation/Percussion:Note:No mass. Palpation and Percussion of the abdomen reveal- Non Tender, Non Distended + BS, no rebound or guarding.   Neurologic Cranial Nerve exam:- CN III-XII intact(No nystagmus), symmetric smile. Strength:- 5/5 equal and symmetric strength both upper and lower extremities.      Assessment & Plan:  History of MSSA bacteremia and UTI.  Clinically doing well presently.  Infectious disease MD set you up for vancomycin and aminoglycosides infusion.  In addition appears that cefazolin has been given as well for your UTI.  Keep the follow-up appointment with ID as scheduled.  Repeating CBC, CMP and magnesium per discharge instructions.  For diabetes continue  metformin.  For history of permanent atrial fibrillation continue atenolol and Eliquis.  For hypertension well-controlled I continue losartan.  For BPH continue Flomax.  Follow-up as regular scheduled with PCP or as needed.  We will update you on lab results when those are in.  Mackie Pai, PA-C

## 2020-06-18 NOTE — Patient Instructions (Addendum)
History of MSSA bacteremia and UTI.  Clinically doing well presently.  Infectious disease MD set you up for vancomycin and aminoglycosides infusion.  In addition appears that cefazolin has been given as well for your UTI.  Keep the follow-up appointment with ID as scheduled.  Repeating CBC, CMP and magnesium per discharge instructions.  For diabetes continue metformin.  For history of permanent atrial fibrillation continue atenolol and Eliquis.  For hypertension well-controlled I continue losartan.  For BPH continue Flomax.  Follow-up as regular scheduled with PCP or as needed.  We will update you on lab results when those are in.

## 2020-06-19 DIAGNOSIS — A408 Other streptococcal sepsis: Secondary | ICD-10-CM | POA: Diagnosis not present

## 2020-06-20 DIAGNOSIS — A408 Other streptococcal sepsis: Secondary | ICD-10-CM | POA: Diagnosis not present

## 2020-06-21 DIAGNOSIS — A408 Other streptococcal sepsis: Secondary | ICD-10-CM | POA: Diagnosis not present

## 2020-06-22 DIAGNOSIS — A408 Other streptococcal sepsis: Secondary | ICD-10-CM | POA: Diagnosis not present

## 2020-06-23 DIAGNOSIS — A408 Other streptococcal sepsis: Secondary | ICD-10-CM | POA: Diagnosis not present

## 2020-06-24 DIAGNOSIS — A408 Other streptococcal sepsis: Secondary | ICD-10-CM | POA: Diagnosis not present

## 2020-06-24 DIAGNOSIS — R202 Paresthesia of skin: Secondary | ICD-10-CM | POA: Diagnosis not present

## 2020-06-24 DIAGNOSIS — I4891 Unspecified atrial fibrillation: Secondary | ICD-10-CM | POA: Diagnosis not present

## 2020-06-24 DIAGNOSIS — E118 Type 2 diabetes mellitus with unspecified complications: Secondary | ICD-10-CM | POA: Diagnosis not present

## 2020-06-24 DIAGNOSIS — R7881 Bacteremia: Secondary | ICD-10-CM | POA: Diagnosis not present

## 2020-06-25 DIAGNOSIS — A408 Other streptococcal sepsis: Secondary | ICD-10-CM | POA: Diagnosis not present

## 2020-06-26 DIAGNOSIS — A408 Other streptococcal sepsis: Secondary | ICD-10-CM | POA: Diagnosis not present

## 2020-06-27 DIAGNOSIS — A408 Other streptococcal sepsis: Secondary | ICD-10-CM | POA: Diagnosis not present

## 2020-06-28 DIAGNOSIS — A408 Other streptococcal sepsis: Secondary | ICD-10-CM | POA: Diagnosis not present

## 2020-06-29 DIAGNOSIS — A408 Other streptococcal sepsis: Secondary | ICD-10-CM | POA: Diagnosis not present

## 2020-06-30 DIAGNOSIS — A408 Other streptococcal sepsis: Secondary | ICD-10-CM | POA: Diagnosis not present

## 2020-07-01 ENCOUNTER — Other Ambulatory Visit: Payer: Self-pay

## 2020-07-01 ENCOUNTER — Encounter: Payer: Self-pay | Admitting: Internal Medicine

## 2020-07-01 ENCOUNTER — Ambulatory Visit (INDEPENDENT_AMBULATORY_CARE_PROVIDER_SITE_OTHER): Payer: Medicare Other | Admitting: Internal Medicine

## 2020-07-01 VITALS — BP 158/93 | HR 64 | Temp 98.3°F | Ht 72.0 in | Wt 194.0 lb

## 2020-07-01 DIAGNOSIS — R202 Paresthesia of skin: Secondary | ICD-10-CM | POA: Diagnosis not present

## 2020-07-01 DIAGNOSIS — A408 Other streptococcal sepsis: Secondary | ICD-10-CM | POA: Diagnosis not present

## 2020-07-01 DIAGNOSIS — R7881 Bacteremia: Secondary | ICD-10-CM | POA: Diagnosis not present

## 2020-07-01 DIAGNOSIS — Z5181 Encounter for therapeutic drug level monitoring: Secondary | ICD-10-CM

## 2020-07-01 DIAGNOSIS — B9561 Methicillin susceptible Staphylococcus aureus infection as the cause of diseases classified elsewhere: Secondary | ICD-10-CM

## 2020-07-01 DIAGNOSIS — I4891 Unspecified atrial fibrillation: Secondary | ICD-10-CM | POA: Diagnosis not present

## 2020-07-01 DIAGNOSIS — E118 Type 2 diabetes mellitus with unspecified complications: Secondary | ICD-10-CM | POA: Diagnosis not present

## 2020-07-01 NOTE — Assessment & Plan Note (Signed)
MSSA bacteremia of unclear etiology.  No evidence of endocarditis by transesophageal echocardiogram and his follow-up blood cultures cleared immediately.  He will complete 4 weeks of cefazolin on July 08, 2020.  I have reviewed his most recent OPAT labs and WBC is normal, BMP stable.   PLAN: . Continue cefazolin through 07/08/2020 along with OPAT lab monitoring . Okay to pull PICC line at end of therapy . No need for further ID follow-up at this time

## 2020-07-01 NOTE — Assessment & Plan Note (Signed)
Labs from OPAT are stable with normal renal function and WBC has normalized.  PLAN: . Continue OPAT labs while on cefazolin

## 2020-07-01 NOTE — Patient Instructions (Signed)
Thank you for coming to see me today. It was a pleasure seeing you.  To Do: Marland Kitchen Complete antibiotics as planned on February 1 . Your nurse will remove your PICC line at end of treatment . You can follow up with me as needed  If you have any questions or concerns, please do not hesitate to call the office at [phone].  Take Care,   Jule Ser, DO

## 2020-07-01 NOTE — Progress Notes (Signed)
[facility] for Infectious Disease  Reason for Consult: Hospital follow-up for MSSA bacteremia  Referring Provider: Hospital follow-up   HPI:    Harry Brown is a 80 y.o. male who presents to clinic for follow-up after recent hospitalization for MSSA bacteremia of unclear etiology.     Patient was recently hospitalized from June 09, 2020 through June 13, 2020 after presenting to the emergency department with fever and malaise for approximately 2 days.  Blood cultures obtained during his ER visit were positive for methicillin sensitive staph aureus.  He was called back to the hospital at that time where he remained afebrile with normal heart rate and normal blood pressure.  He was started on cefazolin.  Transthoracic echocardiogram on January 5 showed normal LV function with no mention of vegetation.  Transesophageal echo was performed January 7 without any vegetation.  Home antibiotics were arranged and PICC line was placed after repeat blood cultures were negative.  Plan is for 4 weeks duration of antibiotics given no evidence of endocarditis and community onset of bacteremia in addition to his medical comorbidities.  He had a hospital follow-up appointment on January 12 with his primary care office.  Appeared to be doing well at that visit with no new concerns raised.  Their note indicates that we set him up with vancomycin and aminoglycosides and fusion, however, this is not the case and he has not been receiving these medications.  He is only on cefazolin to treat his MSSA infection.  He is doing well today.  Tolerating cefazolin without issues.  No fevers, chills, n/v/d.  Picc line has been functioning well.  Patient's Medications  New Prescriptions   No medications on file  Previous Medications   ATENOLOL (TENORMIN) 25 MG TABLET    TAKE ONE (1) TABLET BY MOUTH EVERY DAY   ATORVASTATIN (LIPITOR) 40 MG TABLET    TAKE ONE (1) TABLET BY MOUTH EACH DAY AT6PM   CEFAZOLIN  (ANCEF) IVPB    Inject 2 g into the vein every 8 (eight) hours for 25 days. Indication:  MSSA bacteremia First Dose: Yes Last Day of Therapy:  07/08/20 Labs - Once weekly:  CBC/D and BMP, Labs - Every other week:  ESR and CRP Method of administration: IV Push Method of administration may be changed at the discretion of home infusion pharmacist based upon assessment of the patient and/or caregiver's ability to self-administer the medication ordered.   CHOLECALCIFEROL (VITAMIN D) 50 MCG (2000 UT) TABLET    Take 2,000 Units by mouth daily.    ELIQUIS 5 MG TABS TABLET    TAKE ONE (1) TABLET BY MOUTH TWO (2) TIMES DAILY   LATANOPROST (XALATAN) 0.005 % OPHTHALMIC SOLUTION    Place 1 drop into both eyes at bedtime.    LEVOTHYROXINE (SYNTHROID) 75 MCG TABLET    Take 1 tablet (75 mcg total) by mouth daily before breakfast.   LORATADINE (CLARITIN) 10 MG TABLET    Take 10 mg by mouth daily as needed for allergies.   LOSARTAN (COZAAR) 25 MG TABLET    TAKE ONE (1) TABLET BY MOUTH EVERY DAY   METFORMIN (GLUCOPHAGE-XR) 500 MG 24 HR TABLET    TAKE TWO (2) TABLETS BY MOUTH EVERY MORNING WITH BREAKFAST   NITROGLYCERIN (NITROSTAT) 0.4 MG SL TABLET    Place 1 tablet (0.4 mg total) under the tongue every 5 (five) minutes as needed for chest pain.   ONETOUCH DELICA LANCETS 15P MISC  USE TO CHECK BLOOD SUGAR ONCE DAILY   ONETOUCH ULTRA TEST STRIP    USE 1 STRIP DAILY   TAMSULOSIN (FLOMAX) 0.4 MG CAPS CAPSULE    TAKE ONE CAPSULE BY MOUTH DAILY  Modified Medications   No medications on file  Discontinued Medications   No medications on file      Past Medical History:  Diagnosis Date  . Anemia 09/24/2013  . CAD (coronary artery disease)   . Cellulitis 05/14/2013   RLE  . Diverticulitis   . ED (erectile dysfunction)   . Glaucoma 08/18/2014  . History of sleep apnea    had surgery to correct  . Hypertension   . Hypothyroidism   . Medicare annual wellness visit, subsequent 04/13/2013  . Myocardial infarction  ([place]) 11/05/1985  . Nocturia 03/15/2017  . Other and unspecified hyperlipidemia   . Penile lesion 02/24/2015  . Peripheral neuropathy 03/31/2012  . Peripheral vascular disease ([place])   . Postoperative anemia due to acute blood loss 09/24/2013  . Preventative health care 03/13/2016  . Sleep apnea 09/16/2017  . Type II diabetes mellitus (HCC)    fasting 100-120  . Urinary urgency 11/22/2012    Social History   Tobacco Use  . Smoking status: Former Smoker    Packs/day: 1.50    Years: 50.00    Pack years: 75.00    Types: Cigarettes  . Smokeless tobacco: Never Used  . Tobacco comment: 05/14/2013 "quit smoking in ~ 2004; still Uses Comit lozenges"  Vaping Use  . Vaping Use: Never used  Substance Use Topics  . Alcohol use: No    Alcohol/week: 0.0 standard drinks  . Drug use: No    Family History  Problem Relation Age of Onset  . Hyperlipidemia Mother   . Heart disease Mother   . Diabetes Mother   . Hypertension Mother   . Heart disease Father   . Asthma Father   . Arthritis Father   . Heart disease Sister   . Heart disease Sister        s/p bypass x 2  . Lupus Sister   . Heart disease Brother        MI waiting on heart transplant when died  . Heart disease Brother        massive MI  . Cancer Neg Hx     Allergies  Allergen Reactions  . Adhesive [Tape] Rash  . Latex Rash  . Other Hives, Swelling and Rash    Plastic shopping bags Metal staples: rash, swelling    Review of Systems  Constitutional: Negative for chills and fever.  Respiratory: Negative.   Cardiovascular: Negative.   Gastrointestinal: Negative.       OBJECTIVE:    Vitals:   07/01/20 1338  BP: (!) 158/93  Pulse: 64  Temp: 98.3 F (36.8 C)  TempSrc: Oral  SpO2: 99%  Weight: 194 lb (88 kg)  Height: 6' (1.829 m)     Body mass index is 26.31 kg/m.  Physical Exam Constitutional:      General: He is not in acute distress.    Appearance: Normal appearance.  Cardiovascular:     Rate and  Rhythm: Normal rate and regular rhythm.  Pulmonary:     Effort: Pulmonary effort is normal.     Breath sounds: Normal breath sounds.  Neurological:     General: No focal deficit present.     Mental Status: He is alert and oriented to person, place, and time.  Psychiatric:  Mood and Affect: Mood normal.        Behavior: Behavior normal.      Labs and Microbiology:  CBC Latest Ref Rng & Units 06/18/2020 06/13/2020 06/11/2020  WBC 4.0 - 10.5 K/uL 11.9(H) 11.0(H) 12.9(H)  Hemoglobin 13.0 - 17.0 g/dL 11.6(L) 10.7(L) 11.0(L)  Hematocrit 39.0 - 52.0 % 36.0(L) 31.4(L) 32.2(L)  Platelets 150.0 - 400.0 K/uL 272.0 218 190   CMP Latest Ref Rng & Units 06/18/2020 06/13/2020 06/11/2020  Glucose 70 - 99 mg/dL 114(H) 107(H) 130(H)  BUN 6 - 23 mg/dL _0 Creatinine 0.40 - 1.50 mg/dL 0.88 1.00 1.01  Sodium 135 - 145 mEq/L 138 138 138  Potassium 3.5 - 5.1 mEq/L 4.3 3.8 3.6  Chloride 96 - 112 mEq/L 104 107 104  CO2 19 - 32 mEq/L _1 Calcium 8.4 - 10.5 mg/dL 9.2 8.7(L) 8.8(L)  Total Protein 6.0 - 8.3 g/dL 6.7 - -  Total Bilirubin 0.2 - 1.2 mg/dL 0.5 - -  Alkaline Phos 39 - 117 U/L 88 - -  AST 0 - 37 U/L 27 - -  ALT 0 - 53 U/L 11 - -      ASSESSMENT & PLAN:    MSSA bacteremia MSSA bacteremia of unclear etiology.  No evidence of endocarditis by transesophageal echocardiogram and his follow-up blood cultures cleared immediately.  He will complete 4 weeks of cefazolin on July 08, 2020.  I have reviewed his most recent OPAT labs and WBC is normal, BMP stable.   PLAN: . Continue cefazolin through 07/08/2020 along with OPAT lab monitoring . Okay to pull PICC line at end of therapy . No need for further ID follow-up at this time  Encounter for therapeutic drug monitoring Labs from OPAT are stable with normal renal function and WBC has normalized.  PLAN: . Continue OPAT labs while on cefazolin     Raynelle Highland for Infectious Disease [place]  [facility] 07/01/2020, 2:05 PM

## 2020-07-02 ENCOUNTER — Other Ambulatory Visit: Payer: Self-pay | Admitting: Family Medicine

## 2020-07-02 DIAGNOSIS — A408 Other streptococcal sepsis: Secondary | ICD-10-CM | POA: Diagnosis not present

## 2020-07-03 DIAGNOSIS — A408 Other streptococcal sepsis: Secondary | ICD-10-CM | POA: Diagnosis not present

## 2020-07-04 DIAGNOSIS — H04123 Dry eye syndrome of bilateral lacrimal glands: Secondary | ICD-10-CM | POA: Diagnosis not present

## 2020-07-04 DIAGNOSIS — H401211 Low-tension glaucoma, right eye, mild stage: Secondary | ICD-10-CM | POA: Diagnosis not present

## 2020-07-04 DIAGNOSIS — A408 Other streptococcal sepsis: Secondary | ICD-10-CM | POA: Diagnosis not present

## 2020-07-04 DIAGNOSIS — Z961 Presence of intraocular lens: Secondary | ICD-10-CM | POA: Diagnosis not present

## 2020-07-04 DIAGNOSIS — E119 Type 2 diabetes mellitus without complications: Secondary | ICD-10-CM | POA: Diagnosis not present

## 2020-07-05 DIAGNOSIS — A408 Other streptococcal sepsis: Secondary | ICD-10-CM | POA: Diagnosis not present

## 2020-07-06 DIAGNOSIS — A408 Other streptococcal sepsis: Secondary | ICD-10-CM | POA: Diagnosis not present

## 2020-07-07 DIAGNOSIS — A408 Other streptococcal sepsis: Secondary | ICD-10-CM | POA: Diagnosis not present

## 2020-07-08 DIAGNOSIS — R202 Paresthesia of skin: Secondary | ICD-10-CM | POA: Diagnosis not present

## 2020-07-08 DIAGNOSIS — R7881 Bacteremia: Secondary | ICD-10-CM | POA: Diagnosis not present

## 2020-07-08 DIAGNOSIS — A408 Other streptococcal sepsis: Secondary | ICD-10-CM | POA: Diagnosis not present

## 2020-07-10 ENCOUNTER — Ambulatory Visit (INDEPENDENT_AMBULATORY_CARE_PROVIDER_SITE_OTHER): Payer: Medicare Other

## 2020-07-10 DIAGNOSIS — I4821 Permanent atrial fibrillation: Secondary | ICD-10-CM | POA: Diagnosis not present

## 2020-07-10 DIAGNOSIS — I10 Essential (primary) hypertension: Secondary | ICD-10-CM

## 2020-07-10 DIAGNOSIS — E782 Mixed hyperlipidemia: Secondary | ICD-10-CM

## 2020-07-10 DIAGNOSIS — E1151 Type 2 diabetes mellitus with diabetic peripheral angiopathy without gangrene: Secondary | ICD-10-CM | POA: Diagnosis not present

## 2020-07-10 DIAGNOSIS — I700 Atherosclerosis of aorta: Secondary | ICD-10-CM

## 2020-07-10 NOTE — Chronic Care Management (AMB) (Signed)
Chronic Care Management Pharmacy  Name: Harry Brown  MRN: [id number] DOB: 11-13-1940  Chief Complaint/ HPI  Harry Brown,  80 y.o. , male presents for their Follow-Up CCM visit with the clinical pharmacist In office.  PCP : [name], MD  Their chronic conditions include: Hypertension, Hyperlipidemia/Atherosclerosis/PVD, Diabetes, Afib, Hypothyroidism, Allergic Rhinitis, BPH, Glaucoma  Office Visits: 06/18/20 ([place]) - follow up, doing well overall, ID set up an infusion of vancomycin and aminoglycosides  04/03/20: Visit w/ Dr. Charlett Blake -   No med changes noted  Consult Visit:  06/09/20 ([facility]) - MSSA bacteremia, given IV cefazolin.  He has a PICC line placed for 4 total weeks of IV Ancef.  02/06/20: Cardio visit w/ Dr. Stanford Breed - Pt in afib today, but asymptomatic. No med changes noted.  Medications: Outpatient Encounter Medications as of 07/10/2020  Medication Sig  . atenolol (TENORMIN) 25 MG tablet TAKE ONE (1) TABLET BY MOUTH EVERY DAY (Patient taking differently: Take 25 mg by mouth daily.)  . atorvastatin (LIPITOR) 40 MG tablet TAKE ONE (1) TABLET BY MOUTH EACH DAY AT6PM (Patient taking differently: Take 40 mg by mouth daily.)  . Cholecalciferol (VITAMIN D) 50 MCG (2000 UT) tablet Take 2,000 Units by mouth daily.   Marland Kitchen ELIQUIS 5 MG TABS tablet TAKE ONE (1) TABLET BY MOUTH TWO (2) TIMES DAILY (Patient taking differently: Take 5 mg by mouth 2 (two) times daily.)  . latanoprost (XALATAN) 0.005 % ophthalmic solution Place 1 drop into both eyes at bedtime.   Marland Kitchen levothyroxine (SYNTHROID) 75 MCG tablet Take 1 tablet (75 mcg total) by mouth daily before breakfast.  . loratadine (CLARITIN) 10 MG tablet Take 10 mg by mouth daily as needed for allergies.  Marland Kitchen losartan (COZAAR) 25 MG tablet TAKE ONE (1) TABLET BY MOUTH EVERY DAY (Patient taking differently: Take 25 mg by mouth daily.)  . metFORMIN (GLUCOPHAGE-XR) 500 MG 24 hr tablet TAKE TWO (2) TABLETS BY MOUTH EVERY MORNING WITH  BREAKFAST (Patient taking differently: Take 1,000 mg by mouth in the morning and at bedtime.)  . ONETOUCH DELICA LANCETS 84X MISC USE TO CHECK BLOOD SUGAR ONCE DAILY  . ONETOUCH ULTRA test strip USE 1 STRIP DAILY  . tamsulosin (FLOMAX) 0.4 MG CAPS capsule TAKE ONE CAPSULE BY MOUTH DAILY  . nitroGLYCERIN (NITROSTAT) 0.4 MG SL tablet Place 1 tablet (0.4 mg total) under the tongue every 5 (five) minutes as needed for chest pain.   No facility-administered encounter medications on file as of 07/10/2020.   SDOH Screenings   Alcohol Screen: Not on file  Depression (PHQ2-9): Low Risk   . PHQ-2 Score: 0  Financial Resource Strain: Medium Risk  . Difficulty of Paying Living Expenses: Somewhat hard  Food Insecurity: Not on file  Housing: Not on file  Physical Activity: Not on file  Social Connections: Not on file  Stress: Not on file  Tobacco Use: Medium Risk  . Smoking Tobacco Use: Former Smoker  . Smokeless Tobacco Use: Never Used  Transportation Needs: Not on file     Current Diagnosis/Assessment:  Goals Addressed            This Visit's Progress   . Chronic Care Management Pharmacy Care Plan       CARE PLAN ENTRY (see longitudinal plan of care for additional care plan information)  Current Barriers:  . Chronic Disease Management support, education, and care coordination needs related to Hypertension, Hyperlipidemia/Atherosclerosis/PVD, Diabetes, Afib, Hypothyroidism, Allergic Rhinitis, BPH, Glaucoma   Hypertension BP Readings  from Last 3 Encounters:  07/01/20 (!) 158/93  06/18/20 135/62  06/13/20 (!) 142/78   . Pharmacist Clinical Goal(s): o Over the next 90 days, patient will work with PharmD and providers to maintain BP goal <130/80 . Current regimen:  . Atenolol 55m daily  . Losartan 22mdaily . Interventions: o Discussed BP goal o Requested patient to check BP 2-3 times per week and record . Patient self care activities - Over the next 90 days, patient  will: o Check BP 2-3 times per week, document, and provide at future appointments o Ensure daily salt intake < 2300 mg/day  Hyperlipidemia/Atherosclerosis/PVD Lab Results  Component Value Date/Time   LDLCALC 67 04/03/2020 10:44 AM   . Pharmacist Clinical Goal(s): o Over the next 90 days, patient will work with PharmD and providers to maintain LDL goal < 70 . Current regimen:  o Atorvastatin 4063maily . Interventions: o Discussed diet and exercise o Discussed LDL goal . Patient self care activities - Over the next 90 days, patient will: o Maintain cholesterol medication regimen.   Diabetes Lab Results  Component Value Date/Time   HGBA1C 6.5 (H) 04/03/2020 10:44 AM   HGBA1C 6.5 11/15/2019 11:50 AM   . Pharmacist Clinical Goal(s): o Over the next 90 days, patient will work with PharmD and providers to maintain A1c goal <7% . Current regimen:  o Metformin XR 500m26m daily . Interventions: o Discussed diet and exercise o Discussed DM goal o Reviewed home glucose readings  . Patient self care activities - Over the next 90 days, patient will: o Check blood sugar once daily, document, and provide at future appointments o Contact provider with any episodes of hypoglycemia  AFib . Pharmacist Clinical Goal(s) o Over the next 90 days, patient will work with PharmD and providers to reduce risk of stroke associated with Afib and reduce barrier to obtaining medication . Current regimen:   Eliquis 5mg 55mce daily  Atenolol 25mg 59my  . Interventions: o Reminded him of patient assistance application for Eliquis . Patient self care activities - Over the next 90 days, patient will: o Contact providers if copay on Eliquis becomes a burden  Medication management . Pharmacist Clinical Goal(s): o Over the next 90 days, patient will work with PharmD and providers to maintain optimal medication adherence . Current pharmacy: [facility] . Interventions o Comprehensive medication  review performed. o Continue current medication management strategy . Patient self care activities - Over the next 90 days, patient will: o Focus on medication adherence by filling and taking medications appropriately o Take medications as prescribed o Report any questions or concerns to PharmD and/or provider(s)  Please see past updates related to this goal by clicking on the "Past Updates" button in the selected goal        Social Hx:  Married (3rd marriage) for 35 years. Two previous wives of 10 years each.  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  TVirginiaical Day:  Wife sits with a person, so he does work around the house Still does insurance for 4 customers  Hypertension   BP goal is:  <130/80  Office blood pressures are  BP Readings from Last 3 Encounters:  07/01/20 (!) 158/93  06/18/20 135/62  06/13/20 (!) 142/78   Patient checks BP at home infrequently Patient home BP readings  are ranging: Unable to assess  Patient has failed these meds in the past: None noted  Patient is currently controlled on the following medications:  . Atenolol 58m daily  . Losartan 231mdaily   We discussed diet and exercise extensively and BP goal  Update 07/10/20 No specific readings Reports home nurse that came to take PICC line out took his BP and it was "normal" Reports adherence with medications Asked him to continue to monitor and record  Plan -Consider checking BP 2-3 times per week and record -Continue current medications    Hyperlipidemia/Atherosclerosis/PVD   LDL goal < 70  Last lipids Lab Results  Component Value Date   CHOL 117 04/03/2020   HDL 37 (L) 04/03/2020   LDLCALC 67 04/03/2020   TRIG 53 04/03/2020   CHOLHDL 3.2 04/03/2020   Hepatic Function Latest Ref Rng & Units 06/18/2020 06/10/2020 06/08/2020  Total  Protein 6.0 - 8.3 g/dL 6.7 6.6 7.5  Albumin 3.5 - 5.2 g/dL 3.9 3.4(L) 4.2  AST 0 - 37 U/L _0 ALT 0 - 53 U/L _1 Alk Phosphatase 39 - 117 U/L 88 58 66  Total Bilirubin 0.2 - 1.2 mg/dL 0.5 1.3(H) 1.3(H)  Bilirubin, Direct 0.0 - 0.2 mg/dL - - -     The ASCVD Risk score (GMikey BussingC Jr., et al., 2013) failed to calculate for the following reasons:   The 2013 ASCVD risk score is only valid for ages 4026o 7965 Patient has failed these meds in past: simvastatin (inefficacy) Patient is currently controlled on the following medications:  . Atorvastatin 408maily . 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   We discussed:  diet and exercise extensively and LDL goal  Update 07/10/20 Most recent lipids controlled Denies myalgias Continues to report adherence  Plan -Continue current medications  Diabetes   A1c goal <7%  Recent Relevant Labs: Lab Results  Component Value Date/Time   HGBA1C 6.5 (H) 04/03/2020 10:44 AM   HGBA1C 6.5 11/15/2019 11:50 AM   GFR 81.18 06/18/2020 11:39 AM   GFR 69.49 11/15/2019 11:50 AM   MICROALBUR 0.7 03/11/2016 02:47 PM   MICROALBUR 0.7 02/24/2015 09:59 AM    Last diabetic Eye exam:  Lab Results  Component Value Date/Time   HMDIABEYEEXA No Retinopathy 05/26/2018 12:00 AM    Last diabetic Foot exam: No results found for: HMDIABFOOTEX   Checking BG: Daily  Recent FBG Readings: 114 this AM; Highest 135, Lowest 99  Patient has failed these meds in past: None noted  Patient is currently controlled on the following medications: . MMarland Kitchentformin XR 500m1m daily  Diet B - honey nut cheerios with 1% milk L - PB and ritz cracker (~1/2 sleeve) D - BBQ ribs, fried okra, salad Snacks - minimal; cookies Drinks - 2-3 cups of black coffee  Exercise Limited by leg pain About 3 week ago [place] [facility] [place] [place] and  foot went to sleep. He couldn't find the break, and almost got into a wreck. He states the episode lasted less than a minute, and has not happened since.  Feels it may have been a pinched nerve.  We discussed: diet and exercise extensively and DM goal  Update 07/10/20 105-128 - all of his glucose readings are in this range  Most recent A1c is controlled He has had no episodes of hypoglycemia Eats occasional ice cream and his numbers are still controlled  Plan -Continue current medications   AFIB   Patient is currently rate controlled.  Patient has failed these meds in past: None noted  Patient is currently controlled on the following medications:   Eliquis 36m twice daily  Atenolol 243mdaily   He is paying $47/month and states this stretches his budget.  He's not sure if he would qualify for patient assistance, but would like to attempt.  Pt signed portion of his paper in office today.  Will get Dr. BlCharlett Blakeo sign her portion. Asked patient to return proof of income and out of pocket pharmacy spend for 2021 to include with completed application.  Update 07/10/20 Reminded patient of Eliquis PAP He says copay is affordable right now  Plan -Continue current medications    Hypothyroidism   Lab Results  Component Value Date/Time   TSH 3.70 04/03/2020 10:44 AM   TSH 5.31 (H) 11/15/2019 11:50 AM   FREET4 0.81 05/02/2018 10:53 AM   FREET4 1.08 12/16/2011 03:25 PM    Patient has failed these meds in past: None noted  Patient is currently controlled on the following medications:  . Levothyroxine 7567mdaily  Update 07/10/20 TSH normal  Plan -Continue current medications   Allergic Rhinitis    Patient has failed these meds in past: None noted  Patient is currently controlled on the following medications: . Loratadine 35m31m needed for allergies   Plan -Continue current medications   BPH   PSA  Date Value Ref Range Status  05/02/2018 1.47 0.10 - 4.00  ng/mL Final    Comment:    Test performed using Access Hybritech PSA Assay, a parmagnetic partical, chemiluminecent immunoassay.  03/11/2016 0.46 0.10 - 4.00 ng/mL Final  11/20/2012 0.58 <=4.00 ng/mL Final    Comment:    Test Methodology: ECLIA PSA (Electrochemiluminescence Immunoassay)   For PSA values from 2.5-4.0, particularly in younger men <60 y21rs old, the AUA and NCCN suggest testing for % Free PSA (3515) and evaluation of the rate of increase in PSA (PSA velocity).     Patient has failed these meds in past: dutasteride (insurance coverage), finasteride (listed in D/C meds. No apparent reason for D/C) Patient is currently controlled on the following medications:  . Tamsulosin 0.4mg 28mly  Overnight urination: 1-3 times per night Reports a stream and bladder emptying   Update 07/10/20 Denies bladder symptoms Stream is sufficient and is emptying bladder Plan -Continue current medications   Glaucoma   [facility] failed these meds in past: None noted  Patient is currently controlled on the following medications: . Latanoprost 0.005% 1 drop both eyes  Plan -Continue current medications  Vaccines   Reviewed and discussed patient's vaccination history.    Immunization History  Administered Date(s) Administered  . Fluad Quad(high Dose 65+) 04/20/2019, 04/03/2020  . Influenza Split 03/31/2012  . Influenza, High Dose Seasonal PF 03/20/2013, 02/24/2015, 03/11/2016, 03/15/2017, 05/02/2018  . Influenza,inj,Quad PF,6+ Mos 01/29/2014  . Influenza,trivalent, recombinat, inj, PF 04/06/2011  . PFIZER(Purple Top)SARS-COV-2 Vaccination 06/27/2019, 07/18/2019, 03/14/2020  . Pneumococcal Conjugate-13 03/20/2013  . Pneumococcal Polysaccharide-23 10/11/2004, 02/24/2015  . Td 10/12/2003, 06/07/2009    Plan -Recommended patient receive Shingrix vaccine in pharmacy.  -Recommended patient receive Tetanus vaccine in pharmacy.  Medication  Management   [facility] Uses pill box?  Yes Pt endorses 100% compliance  Miscellaneous Meds Vitamin D 2000 units daily  We discussed: Discussed benefits of medication synchronization, packaging and delivery as well as enhanced pharmacist oversight with [employer]. Pt states he is "set in my ways" and would like to continue current management of medications  Plan -Continue current medication management strategy    Follow up:  3 month phone visit  Beverly Milch, PharmD Clinical Pharmacist [phone]

## 2020-07-10 NOTE — Patient Instructions (Addendum)
Visit Information  Goals Addressed            This Visit's Progress   . Chronic Care Management Pharmacy Care Plan       CARE PLAN ENTRY (see longitudinal plan of care for additional care plan information)  Current Barriers:  . Chronic Disease Management support, education, and care coordination needs related to Hypertension, Hyperlipidemia/Atherosclerosis/PVD, Diabetes, Afib, Hypothyroidism, Allergic Rhinitis, BPH, Glaucoma   Hypertension BP Readings from Last 3 Encounters:  07/01/20 (!) 158/93  06/18/20 135/62  06/13/20 (!) 142/78   . Pharmacist Clinical Goal(s): o Over the next 90 days, patient will work with PharmD and providers to maintain BP goal <130/80 . Current regimen:  . Atenolol 25mg  daily  . Losartan 25mg  daily . Interventions: o Discussed BP goal o Requested patient to check BP 2-3 times per week and record . Patient self care activities - Over the next 90 days, patient will: o Check BP 2-3 times per week, document, and provide at future appointments o Ensure daily salt intake < 2300 mg/day  Hyperlipidemia/Atherosclerosis/PVD Lab Results  Component Value Date/Time   LDLCALC 67 04/03/2020 10:44 AM   . Pharmacist Clinical Goal(s): o Over the next 90 days, patient will work with PharmD and providers to maintain LDL goal < 70 . Current regimen:  o Atorvastatin 40mg  daily . Interventions: o Discussed diet and exercise o Discussed LDL goal . Patient self care activities - Over the next 90 days, patient will: o Maintain cholesterol medication regimen.   Diabetes Lab Results  Component Value Date/Time   HGBA1C 6.5 (H) 04/03/2020 10:44 AM   HGBA1C 6.5 11/15/2019 11:50 AM   . Pharmacist Clinical Goal(s): o Over the next 90 days, patient will work with PharmD and providers to maintain A1c goal <7% . Current regimen:  o Metformin XR 500mg  #2 daily . Interventions: o Discussed diet and exercise o Discussed DM goal o Reviewed home glucose  readings  . Patient self care activities - Over the next 90 days, patient will: o Check blood sugar once daily, document, and provide at future appointments o Contact provider with any episodes of hypoglycemia  AFib . Pharmacist Clinical Goal(s) o Over the next 90 days, patient will work with PharmD and providers to reduce risk of stroke associated with Afib and reduce barrier to obtaining medication . Current regimen:   Eliquis 5mg  twice daily  Atenolol 25mg  daily  . Interventions: o Reminded him of patient assistance application for Eliquis . Patient self care activities - Over the next 90 days, patient will: o Contact providers if copay on Eliquis becomes a burden  Medication management . Pharmacist Clinical Goal(s): o Over the next 90 days, patient will work with PharmD and providers to maintain optimal medication adherence . Current pharmacy: [facility] . Interventions o Comprehensive medication review performed. o Continue current medication management strategy . Patient self care activities - Over the next 90 days, patient will: o Focus on medication adherence by filling and taking medications appropriately o Take medications as prescribed o Report any questions or concerns to PharmD and/or provider(s)  Please see past updates related to this goal by clicking on the "Past Updates" button in the selected goal         The patient verbalized understanding of instructions, educational materials, and care plan provided today and agreed to receive a mailed copy of patient instructions, educational materials, and care plan.   Telephone follow up appointment with pharmacy team member scheduled for: 3  months  Edythe Clarity, [facility]  Preventing Atrial Fibrillation-Related Stroke Atrial fibrillation (AFib) is a common type of irregular or rapid heartbeat (arrhythmia) that increases the risk for a stroke. In AFib, the top portions of the heart (atria) beat out of sync with  the lower portions of the heart. When the muscles of the atria tighten in an uncoordinated way (fibrillating), blood can pool in the heart and form clots. A stroke can be caused by a blood clot that travels to the brain. This type of stroke is preventable. Understanding AFib and knowing how to properly manage it can prevent a stroke from happening. How can this condition affect me? Having AFib can increase your risk for a stroke. A stroke is a medical emergency. It can lead to brain damage and can sometimes be life-threatening. A stroke can be a life-changing event. What can increase my risk? Having AFib can increase your risk of stroke. Other risk factors include:  Having heart failure.  Having high blood pressure.  Being older than age 46.  Having diabetes.  Having a history of vascular disease, such as heart attack or stroke.  Being male.  Having a history of transient ischemic attacks (TIAs). This is sometimes called a ministroke.  Having a family history of stroke. Risk factors that you can change include:  Smoking.  High cholesterol.  Being inactive (sedentary lifestyle).  Eating a diet that is high in fat, cholesterol, and salt. What actions can I take to prevent this?  Maintain a healthy weight.  Get regular exercise. This can include at least 40 minutes of moderate exercise on most days, such as walking at a fast pace, or vigorous exercise, such as jogging.  Get evaluated for obstructive sleep apnea. Talk to your health care provider about getting a sleep evaluation if you snore a lot or have excessive sleepiness.  Manage any other medical conditions you have, such as hypertension or diabetes. Medicines  Take over-the-counter and prescription medicines only as told by your health care provider.  If your health care provider prescribed an anticoagulant, take it exactly as told. Taking too much blood-thinning medicine can cause bleeding. Taking too little may not  give you the protection that you need against stroke and other problems. Eating and drinking  Eat healthy foods, including at least 5 servings of fruits and vegetables a day.  Do not drink alcohol.  Do not drink beverages that have caffeine, such as coffee, soda, and tea.  Follow instructions about your diet as told by your health care provider. Questions to ask your health care provider Contact a health care provider if:  You notice a change in the rate, rhythm, or strength of your heartbeat.  You feel dizzy.  You are taking an anticoagulant and you have more bruises than usual.  You get tired more easily when you exercise or do similar activities. Get help right away if:  You have chest pain.  You have trouble breathing.  You have pain in your abdomen.  You experience unusual sweating or weakness.  You take anticoagulants and you: ? Have severe headaches or confusion. ? Have blood in your vomit, bowel movement, or urine. ? Have bleeding that will not stop. ? Fall or injure your head.  You have any symptoms of a stroke. "BE FAST" is an easy way to remember the main warning signs of a stroke: ? B - Balance. Signs are dizziness, trouble walking, or loss of balance. ? E - Eyes. Signs are  trouble seeing or a sudden change in vision. ? F - Face. Signs are sudden weakness or numbness of the face, or the face or eyelid drooping on one side. ? A - Arms. Signs are weakness or numbness in an arm. This happens suddenly and usually on one side of the body. ? S - Speech. Signs are sudden trouble speaking, slurred speech, or trouble understanding what people say. ? T - Time. Time to call emergency services. Write down what time symptoms started.  You have other signs of a stroke, such as: ? A sudden, severe headache with no known cause. ? Nausea or vomiting. ? Seizure. These symptoms may represent a serious problem that is an emergency. Do not wait to see if the symptoms will go  away. Get medical help right away. Call your local emergency services (911 in the [place] Do not drive yourself to the hospital.   Summary  Having atrial fibrillation (AFib) can increase the risk for a stroke. Talk with your health care provider about what symptoms to watch for.  AFib-related stroke is preventable. Proper management of AFib can prevent you from having a stroke.  Talk with your health care provider about whether anticoagulant medicine is right for you.  Learn the warning signs of a stroke and remember "BE FAST." This information is not intended to replace advice given to you by your health care provider. Make sure you discuss any questions you have with your health care provider. Document Revised: 01/31/2020 Document Reviewed: 01/31/2020 Elsevier Patient Education  [place].

## 2020-07-16 ENCOUNTER — Other Ambulatory Visit: Payer: Self-pay | Admitting: Family Medicine

## 2020-07-26 ENCOUNTER — Other Ambulatory Visit: Payer: Self-pay | Admitting: Family Medicine

## 2020-08-13 ENCOUNTER — Telehealth: Payer: Self-pay | Admitting: Pharmacist

## 2020-08-13 NOTE — Progress Notes (Addendum)
° ° °  Chronic Care Management Pharmacy Assistant   Name: Harry Brown  MRN: [id number] DOB: 12/01/1940  Reason for Encounter: PAP Review    Medications: Outpatient Encounter Medications as of 08/13/2020  Medication Sig   ONETOUCH ULTRA test strip USE 1 STRIP DAILY   atenolol (TENORMIN) 25 MG tablet TAKE ONE (1) TABLET BY MOUTH EVERY DAY (Patient taking differently: Take 25 mg by mouth daily.)   atorvastatin (LIPITOR) 40 MG tablet TAKE ONE (1) TABLET BY MOUTH EACH DAY AT6PM (Patient taking differently: Take 40 mg by mouth daily.)   Cholecalciferol (VITAMIN D) 50 MCG (2000 UT) tablet Take 2,000 Units by mouth daily.    ELIQUIS 5 MG TABS tablet TAKE ONE (1) TABLET BY MOUTH TWO (2) TIMES DAILY (Patient taking differently: Take 5 mg by mouth 2 (two) times daily.)   latanoprost (XALATAN) 0.005 % ophthalmic solution Place 1 drop into both eyes at bedtime.    levothyroxine (SYNTHROID) 75 MCG tablet TAKE ONE (1) TABLET BY MOUTH EVERY DAY   loratadine (CLARITIN) 10 MG tablet Take 10 mg by mouth daily as needed for allergies.   losartan (COZAAR) 25 MG tablet TAKE ONE (1) TABLET BY MOUTH EVERY DAY (Patient taking differently: Take 25 mg by mouth daily.)   metFORMIN (GLUCOPHAGE-XR) 500 MG 24 hr tablet TAKE TWO (2) TABLETS BY MOUTH EVERY MORNING WITH BREAKFAST (Patient taking differently: Take 1,000 mg by mouth in the morning and at bedtime.)   nitroGLYCERIN (NITROSTAT) 0.4 MG SL tablet Place 1 tablet (0.4 mg total) under the tongue every 5 (five) minutes as needed for chest pain.   ONETOUCH DELICA LANCETS 66A MISC USE TO CHECK BLOOD SUGAR ONCE DAILY   tamsulosin (FLOMAX) 0.4 MG CAPS capsule TAKE ONE CAPSULE BY MOUTH DAILY   No facility-administered encounter medications on file as of 08/13/2020.   Called [facility] about the patients application status. They informed me that there was not a active application for Harry Brown. Called Harry Brown and he informed me that he decided not to go through with the  application process. I explained to him my understanding and if at any time he changed his mind he could call and we will restart his application for him he voiced understanding.    Follow-UP: Pharmacist Review  Charlann Lange, RMA Clinical Pharmacist Assistant [phone]  5 minutes spent in review, coordination, and documentation.  Reviewed by: Beverly Milch, PharmD Clinical Pharmacist [facility] [phone]

## 2020-09-01 ENCOUNTER — Other Ambulatory Visit: Payer: Self-pay | Admitting: Family Medicine

## 2020-09-01 ENCOUNTER — Other Ambulatory Visit: Payer: Self-pay

## 2020-09-01 ENCOUNTER — Encounter: Payer: Self-pay | Admitting: Family Medicine

## 2020-09-01 ENCOUNTER — Ambulatory Visit (INDEPENDENT_AMBULATORY_CARE_PROVIDER_SITE_OTHER): Payer: Medicare Other | Admitting: Family Medicine

## 2020-09-01 VITALS — BP 114/66 | HR 90 | Temp 98.2°F | Resp 16 | Wt 202.6 lb

## 2020-09-01 DIAGNOSIS — I700 Atherosclerosis of aorta: Secondary | ICD-10-CM

## 2020-09-01 DIAGNOSIS — I4821 Permanent atrial fibrillation: Secondary | ICD-10-CM

## 2020-09-01 DIAGNOSIS — I10 Essential (primary) hypertension: Secondary | ICD-10-CM | POA: Diagnosis not present

## 2020-09-01 DIAGNOSIS — T7840XD Allergy, unspecified, subsequent encounter: Secondary | ICD-10-CM

## 2020-09-01 DIAGNOSIS — R2681 Unsteadiness on feet: Secondary | ICD-10-CM | POA: Insufficient documentation

## 2020-09-01 DIAGNOSIS — R251 Tremor, unspecified: Secondary | ICD-10-CM

## 2020-09-01 DIAGNOSIS — T7840XA Allergy, unspecified, initial encounter: Secondary | ICD-10-CM | POA: Insufficient documentation

## 2020-09-01 DIAGNOSIS — E782 Mixed hyperlipidemia: Secondary | ICD-10-CM | POA: Diagnosis not present

## 2020-09-01 DIAGNOSIS — E039 Hypothyroidism, unspecified: Secondary | ICD-10-CM | POA: Diagnosis not present

## 2020-09-01 DIAGNOSIS — E1151 Type 2 diabetes mellitus with diabetic peripheral angiopathy without gangrene: Secondary | ICD-10-CM | POA: Diagnosis not present

## 2020-09-01 DIAGNOSIS — H6593 Unspecified nonsuppurative otitis media, bilateral: Secondary | ICD-10-CM | POA: Insufficient documentation

## 2020-09-01 DIAGNOSIS — E118 Type 2 diabetes mellitus with unspecified complications: Secondary | ICD-10-CM | POA: Diagnosis not present

## 2020-09-01 LAB — CBC
HCT: 36.2 % — ABNORMAL LOW (ref 39.0–52.0)
Hemoglobin: 11.8 g/dL — ABNORMAL LOW (ref 13.0–17.0)
MCHC: 32.7 g/dL (ref 30.0–36.0)
MCV: 91.3 fl (ref 78.0–100.0)
Platelets: 190 10*3/uL (ref 150.0–400.0)
RBC: 3.96 Mil/uL — ABNORMAL LOW (ref 4.22–5.81)
RDW: 14.9 % (ref 11.5–15.5)
WBC: 7.2 10*3/uL (ref 4.0–10.5)

## 2020-09-01 LAB — LIPID PANEL
Cholesterol: 121 mg/dL (ref 0–200)
HDL: 40.4 mg/dL (ref >39.00)
LDL Cholesterol: 73 mg/dL (ref 0–99)
NonHDL: 81.07
Total CHOL/HDL Ratio: 3
Triglycerides: 40 mg/dL (ref 0.0–149.0)
VLDL: 8 mg/dL (ref 0.0–40.0)

## 2020-09-01 LAB — COMPREHENSIVE METABOLIC PANEL
ALT: 15 U/L (ref 0–53)
AST: 14 U/L (ref 0–37)
Albumin: 4.1 g/dL (ref 3.5–5.2)
Alkaline Phosphatase: 66 U/L (ref 39–117)
BUN: 26 mg/dL — ABNORMAL HIGH (ref 6–23)
CO2: 26 mEq/L (ref 19–32)
Calcium: 9.4 mg/dL (ref 8.4–10.5)
Chloride: 106 mEq/L (ref 96–112)
Creatinine, Ser: 1 mg/dL (ref 0.40–1.50)
GFR: 70.95 mL/min (ref >60.00)
Glucose, Bld: 121 mg/dL — ABNORMAL HIGH (ref 70–99)
Potassium: 4.4 mEq/L (ref 3.5–5.1)
Sodium: 139 mEq/L (ref 135–145)
Total Bilirubin: 0.7 mg/dL (ref 0.2–1.2)
Total Protein: 6.5 g/dL (ref 6.0–8.3)

## 2020-09-01 LAB — HEMOGLOBIN A1C: Hgb A1c MFr Bld: 6.7 % — ABNORMAL HIGH (ref 4.6–6.5)

## 2020-09-01 LAB — TSH: TSH: 4.47 u[IU]/mL (ref 0.35–4.50)

## 2020-09-01 MED ORDER — FLUTICASONE PROPIONATE 50 MCG/ACT NA SUSP: 2.0000 | Freq: Every day | NASAL | 3 refills | Status: DC

## 2020-09-01 NOTE — Assessment & Plan Note (Signed)
hgba1c acceptable, minimize simple carbs. Increase exercise as tolerated. Continue current meds 

## 2020-09-01 NOTE — Patient Instructions (Signed)

## 2020-09-01 NOTE — Progress Notes (Signed)
Subjective:    Patient ID: Harry Brown, male    DOB: Apr 26, 1941, 80 y.o.   MRN: [id number]  Chief Complaint  Patient presents with  . Follow-up    HPI Patient is in today for follow up on chronic medical concerns. No recent febrile illness although he was Hospitalized with bacteremia in January. He reports he has done well at home since then and has not had any sign of illness or fevers or chills since then. He continues to have pressure and echoing in both ears and sometimes feels they are draining liquid but not today. They are considering cardioversion for his afib. He is noting worsening tremors only in the right had especially when he is on the computer. He endorses more trouble with unsteady gait and a sense of staggering at times. Denies CP/palp/SOB/HA/congestion/fevers/GI or GU c/o. Taking meds as prescribed  Past Medical History:  Diagnosis Date  . Anemia 09/24/2013  . CAD (coronary artery disease)   . Cellulitis 05/14/2013   RLE  . Diverticulitis   . ED (erectile dysfunction)   . Glaucoma 08/18/2014  . History of sleep apnea    had surgery to correct  . Hypertension   . Hypothyroidism   . Medicare annual wellness visit, subsequent 04/13/2013  . Myocardial infarction ([place]) 11/05/1985  . Nocturia 03/15/2017  . Other and unspecified hyperlipidemia   . Penile lesion 02/24/2015  . Peripheral neuropathy 03/31/2012  . Peripheral vascular disease ([place])   . Postoperative anemia due to acute blood loss 09/24/2013  . Preventative health care 03/13/2016  . Sleep apnea 09/16/2017  . Type II diabetes mellitus (HCC)    fasting 100-120  . Urinary urgency 11/22/2012    Past Surgical History:  Procedure Laterality Date  . ABDOMINAL AORTAGRAM  Oct. 9, 2014   Dr. Bridgett Larsson  . ABDOMINAL AORTAGRAM N/A 03/15/2013   Procedure: ABDOMINAL Maxcine Ham;  Surgeon: Conrad Patmos, MD;  Location: [facility] CATH LAB;  Service: Cardiovascular;  Laterality: N/A;  . ANTERIOR CERVICAL DECOMP/DISCECTOMY FUSION  ~ 2000   . CARDIOVERSION N/A 12/03/2019   Procedure: CARDIOVERSION;  Surgeon: Acie Fredrickson Wonda Cheng, MD;  Location: [place];  Service: Cardiovascular;  Laterality: N/A;  . CATARACT EXTRACTION Right   . CORONARY ARTERY BYPASS GRAFT  2001   "CABG X3" (05/14/2013)  . DENTAL SURGERY     "multiple teeth removed; trmimed top of mouth so dentures would fit" (05/14/2013)  . EYE SURGERY     cataracts  . FEMORAL-POPLITEAL BYPASS GRAFT Right 04/25/2013   Procedure: RIGHT FEMORAL TO ABOVE KNEE POPLITEAL BYPASS GRAFT WITH RIGHT GREATER SAPHENOUS VEIN HARVEST; ULTRASOUND GUIDED;  Surgeon: Conrad , MD;  Location: [facility] OR;  Service: Vascular;  Laterality: Right;  . GUM SURGERY  [id number]   "had bone scraped; had periodontal disease" (05/14/2013)  . PALATE / UVULA BIOPSY / EXCISION  2003   Removed due to sleep apnea  . TEE WITHOUT CARDIOVERSION N/A 06/13/2020   Procedure: TRANSESOPHAGEAL ECHOCARDIOGRAM (TEE);  Surgeon: Geralynn Rile, MD;  Location: [place];  Service: Cardiovascular;  Laterality: N/A;  . TONSILLECTOMY      Family History  Problem Relation Age of Onset  . Hyperlipidemia Mother   . Heart disease Mother   . Diabetes Mother   . Hypertension Mother   . Heart disease Father   . Asthma Father   . Arthritis Father   . Heart disease Sister   . Heart disease Sister        s/p bypass x  2  . Lupus Sister   . Heart disease Brother        MI waiting on heart transplant when died  . Heart disease Brother        massive MI  . Cancer Neg Hx     Social History   Socioeconomic History  . Marital status: Married    Spouse name: Not on file  . Number of children: 8   . Years of education: Not on file  . Highest education level: Not on file  Occupational History    Employer: [employer]  Tobacco Use  . Smoking status: Former Smoker    Packs/day: 1.50    Years: 50.00    Pack years: 75.00    Types: Cigarettes  . Smokeless tobacco: Never Used  . Tobacco comment: 05/14/2013  "quit smoking in ~ 2004; still Uses Comit lozenges"  Vaping Use  . Vaping Use: Never used  Substance and Sexual Activity  . Alcohol use: No    Alcohol/week: 0.0 standard drinks  . Drug use: No  . Sexual activity: Yes    Comment: lives with wife, retiring end of next week, no dietary restrictions  Other Topics Concern  . 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 Difficulty of Paying Living Expenses: Somewhat hard  Food Insecurity: Not on file  Transportation Needs: Not on file  Physical Activity: Not on file  Stress: Not on file  Social Connections: Not on file  Intimate Partner Violence: Not on file    Outpatient Medications Prior to Visit  Medication Sig Dispense Refill  . atenolol (TENORMIN) 25 MG tablet TAKE ONE (1) TABLET BY MOUTH EVERY DAY (Patient taking differently: Take 25 mg by mouth daily.) 90 tablet 1  . Cholecalciferol (VITAMIN D) 50 MCG (2000 UT) tablet Take 2,000 Units by mouth daily.     Marland Kitchen ELIQUIS 5 MG TABS tablet TAKE ONE (1) TABLET BY MOUTH TWO (2) TIMES DAILY (Patient taking differently: Take 5 mg by mouth 2 (two) times daily.) 60 tablet 3  . latanoprost (XALATAN) 0.005 % ophthalmic solution Place 1 drop into both eyes at bedtime.     Marland Kitchen levothyroxine (SYNTHROID) 75 MCG tablet TAKE ONE (1) TABLET BY MOUTH EVERY DAY 90 tablet 1  . loratadine (CLARITIN) 10 MG tablet Take 10 mg by mouth daily as needed for allergies.    Marland Kitchen losartan (COZAAR) 25 MG tablet TAKE ONE (1) TABLET BY MOUTH EVERY DAY (Patient taking differently: Take 25 mg by mouth daily.) 30 tablet 11  . metFORMIN (GLUCOPHAGE-XR) 500 MG 24 hr tablet TAKE TWO (2) TABLETS BY MOUTH EVERY MORNING WITH BREAKFAST (Patient taking differently: Take 1,000 mg by mouth in the morning and at bedtime.) 180 tablet 1  . ONETOUCH DELICA LANCETS 77O MISC USE TO CHECK BLOOD SUGAR ONCE  DAILY 100 each 1  . ONETOUCH ULTRA test strip USE 1 STRIP DAILY 100 each 12  . tamsulosin (FLOMAX) 0.4 MG CAPS capsule TAKE ONE CAPSULE BY MOUTH DAILY 30 capsule 2  . atorvastatin (LIPITOR) 40 MG tablet TAKE ONE (1) TABLET BY MOUTH EACH DAY AT6PM (Patient taking differently: Take 40 mg by mouth daily.) 30 tablet 11  . nitroGLYCERIN (NITROSTAT) 0.4 MG SL tablet Place 1 tablet (0.4 mg total) under the tongue every 5 (five) minutes as needed for chest pain. 25  tablet 11   No facility-administered medications prior to visit.    Allergies  Allergen Reactions  . Adhesive [Tape] Rash  . Latex Rash  . Other Hives, Swelling and Rash    Plastic shopping bags Metal staples: rash, swelling    Review of Systems  Constitutional: Positive for malaise/fatigue. Negative for fever.  HENT: Positive for congestion, ear discharge and ear pain.   Eyes: Negative for blurred vision.  Respiratory: Negative for shortness of breath.   Cardiovascular: Negative for chest pain, palpitations and leg swelling.  Gastrointestinal: Negative for abdominal pain, blood in stool and nausea.  Genitourinary: Negative for dysuria and frequency.  Musculoskeletal: Negative for falls.  Skin: Negative for rash.  Neurological: Positive for tremors. Negative for dizziness, loss of consciousness and headaches.  Endo/Heme/Allergies: Negative for environmental allergies.  Psychiatric/Behavioral: Negative for depression. The patient is not nervous/anxious.        Objective:    Physical Exam Vitals and nursing note reviewed.  Constitutional:      General: He is not in acute distress.    Appearance: He is well-developed.  HENT:     Head: Normocephalic and atraumatic.     Nose: Nose normal.  Eyes:     General:        Right eye: No discharge.        Left eye: No discharge.  Cardiovascular:     Rate and Rhythm: Normal rate. Rhythm irregular.     Heart sounds: No murmur heard.   Pulmonary:     Effort: Pulmonary effort  is normal.     Breath sounds: Normal breath sounds.  Abdominal:     General: Bowel sounds are normal.     Palpations: Abdomen is soft.     Tenderness: There is no abdominal tenderness.  Musculoskeletal:     Cervical back: Normal range of motion and neck supple.  Skin:    General: Skin is warm and dry.  Neurological:     Mental Status: He is alert and oriented to person, place, and time.     BP 114/66   Pulse 90   Temp 98.2 F (36.8 C)   Resp 16   Wt 202 lb 9.6 oz (91.9 kg)   SpO2 99%   BMI 27.48 kg/m  Wt Readings from Last 3 Encounters:  09/01/20 202 lb 9.6 oz (91.9 kg)  07/01/20 194 lb (88 kg)  06/18/20 199 lb 3.2 oz (90.4 kg)    Diabetic Foot Exam - Simple   Simple Foot Form Diabetic Foot exam was performed with the following findings: Yes 09/01/2020 12:12 PM  Visual Inspection No deformities, no ulcerations, no other skin breakdown bilaterally: Yes Sensation Testing Intact to touch and monofilament testing bilaterally: Yes Pulse Check Comments Does have thick toenails bilaterally    Lab Results  Component Value Date   WBC 11.9 (H) 06/18/2020   HGB 11.6 (L) 06/18/2020   HCT 36.0 (L) 06/18/2020   PLT 272.0 06/18/2020   GLUCOSE 114 (H) 06/18/2020   CHOL 117 04/03/2020   TRIG 53 04/03/2020   HDL 37 (L) 04/03/2020   LDLCALC 67 04/03/2020   ALT 11 06/18/2020   AST 27 06/18/2020   NA 138 06/18/2020   K 4.3 06/18/2020   CL 104 06/18/2020   CREATININE 0.88 06/18/2020   BUN 23 06/18/2020   CO2 28 06/18/2020   TSH 3.70 04/03/2020   PSA 1.47 05/02/2018   INR 1.5 (H) 06/08/2020   HGBA1C 6.5 (H) 04/03/2020   MICROALBUR  0.7 03/11/2016    Lab Results  Component Value Date   TSH 3.70 04/03/2020   Lab Results  Component Value Date   WBC 11.9 (H) 06/18/2020   HGB 11.6 (L) 06/18/2020   HCT 36.0 (L) 06/18/2020   MCV 91.8 06/18/2020   PLT 272.0 06/18/2020   Lab Results  Component Value Date   NA 138 06/18/2020   K 4.3 06/18/2020   CO2 28 06/18/2020    GLUCOSE 114 (H) 06/18/2020   BUN 23 06/18/2020   CREATININE 0.88 06/18/2020   BILITOT 0.5 06/18/2020   ALKPHOS 88 06/18/2020   AST 27 06/18/2020   ALT 11 06/18/2020   PROT 6.7 06/18/2020   ALBUMIN 3.9 06/18/2020   CALCIUM 9.2 06/18/2020   ANIONGAP 6 06/13/2020   GFR 81.18 06/18/2020   Lab Results  Component Value Date   CHOL 117 04/03/2020   Lab Results  Component Value Date   HDL 37 (L) 04/03/2020   Lab Results  Component Value Date   LDLCALC 67 04/03/2020   Lab Results  Component Value Date   TRIG 53 04/03/2020   Lab Results  Component Value Date   CHOLHDL 3.2 04/03/2020   Lab Results  Component Value Date   HGBA1C 6.5 (H) 04/03/2020       Assessment & Plan:   Problem List Items Addressed This Visit    Hypothyroidism    On Levothyroxine, continue to monitor      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 Increase exercise as tolerated. Continue current meds      Relevant Orders   Hemoglobin A1c   Hyperlipidemia    Encouraged heart healthy diet, increase exercise, avoid trans fats, consider a krill oil cap daily      Relevant Orders   Lipid panel   Hypertension    Well controlled, no changes to meds. Encouraged heart healthy diet such as the DASH diet and exercise as tolerated.       Relevant Orders   CBC   Comprehensive metabolic panel   TSH   Tremor of right hand - Primary    Worsening over months and also noting an increase in unsteady gait. Will refer to neurology for evaluation. Minimize caffeine and stay well rested and well hydrated      Relevant Orders   Ambulatory referral to Neurology   DM (diabetes mellitus) with complications ([place])    QAST4H acceptable, minimize simple carbs. Increase exercise as tolerated. Continue current meds      Atrial fibrillation ([place])    Tried to cardiovert but it did not hold. He is now on Eliquis and rate controlled      Unsteady gait    Relevant Orders   Ambulatory referral to Neurology   Allergies   Relevant Orders   Ambulatory referral to ENT   [facility] (secretory otitis media), bilateral    Patient complains of persistent sense of pressure in his ears. Sometimes feels some liquid and it sounds like an echo at times. Started on Flonase for allergies and ETD. His ears look fairly clear today but will refer to ENT for further evaluation       Other Visit Diagnoses    Bilateral otitis media with effusion       Relevant Orders   Ambulatory referral to ENT      I am having Gwyndolyn Saxon T. Ingerson "Tim" start on fluticasone. I am also having him maintain his loratadine,  latanoprost, Vitamin D, OneTouch Delica Lancets 52W, nitroGLYCERIN, losartan, Eliquis, metFORMIN, atenolol, tamsulosin, OneTouch Ultra, and levothyroxine.  Meds ordered this encounter  Medications  . fluticasone (FLONASE) 50 MCG/ACT nasal spray    Sig: Place 2 sprays into both nostrils daily.    Dispense:  16 g    Refill:  3     Penni Homans, MD

## 2020-09-01 NOTE — Assessment & Plan Note (Signed)
Well controlled, no changes to meds. Encouraged heart healthy diet such as the DASH diet and exercise as tolerated.  °

## 2020-09-01 NOTE — Assessment & Plan Note (Signed)
Patient complains of persistent sense of pressure in his ears. Sometimes feels some liquid and it sounds like an echo at times. Started on Flonase for allergies and ETD. His ears look fairly clear today but will refer to ENT for further evaluation

## 2020-09-01 NOTE — Assessment & Plan Note (Signed)
On Levothyroxine, continue to monitor 

## 2020-09-01 NOTE — Assessment & Plan Note (Signed)
Worsening over months and also noting an increase in unsteady gait. Will refer to neurology for evaluation. Minimize caffeine and stay well rested and well hydrated

## 2020-09-01 NOTE — Assessment & Plan Note (Signed)
Encouraged heart healthy diet, increase exercise, avoid trans fats, consider a krill oil cap daily 

## 2020-09-01 NOTE — Assessment & Plan Note (Signed)
Tried to cardiovert but it did not hold. He is now on Eliquis and rate controlled

## 2020-09-03 NOTE — Progress Notes (Signed)
Assessment/Plan:   1.  Tremor  -Long discussion with patient today.  I actually did not see any tremor today.  Certainly, I reassured him that he had no evidence of Parkinson's disease, and that really is the reassurance that he was looking for.  -Saw no evidence of essential tremor.  No evidence of dystonic tremor.  He admits that he had no tremor today.  We tried multiple things, including handwriting samples, pouring liquids, using weights and none of these generated any tremor.  Regardless, I told the patient that I would not recommend any medication for tremor.  He is on Eliquis, which would interact with primidone.  He is already on a beta-blocker.  The patient agrees, and really just wanted to make sure that he did not have Parkinson's disease.  If tremor gets worse in the future, I am happy to reevaluate him.  Subjective:   Harry Brown was seen today in the movement disorders clinic for neurologic consultation at the request of [name], MD.  The consultation is for the evaluation of tremor and gait instability.  Medical records made available to me have been reviewed.  Tremor is noted in his records dating back to a note in November, 2019.  At that point in time, he was noting right hand tremor when he was working on the computer and acknowledged "his gait is a little more shuffling and he does stumble at times."  Tremor: Yes.     How long has it been going on? Nov 2019  At rest or with activation?  activation  When is it noted the most?  Using mouse  Fam hx of tremor?  No.  Located where?  Right hand "mostly"  Affected by caffeine:  unknown (3 cups coffee/day)  Affected by alcohol:  Doesn't drink any  Affected by stress:  No.  Affected by fatigue:  No.  Spills soup if on spoon:  No.  Affects ADL's (tying shoes, brushing teeth, etc):  No.  Tremor inducing meds:  No.  Other Specific Symptoms:  Voice: yes some change Sleep: trouble staying asleep  Vivid Dreams:  Yes.     Acting out dreams:  No. Wet Pillows: No. Postural symptoms:  Yes.    Falls?  No. Bradykinesia symptoms: difficulty getting out of a chair and difficulty regaining balance Loss of smell:  No. Loss of taste:  No. Difficulty Swallowing:  No. Memory changes:  Yes.   (no troubles with pills; no trouble with driving; trouble remembering conversations) N/V:  No. Lightheaded:  No.  Syncope: No. Diplopia:  No. Dyskinesia:  No.  Neuroimaging of the brain has not previously been performed.      ALLERGIES:   Allergies  Allergen Reactions  . Adhesive [Tape] Rash  . Latex Rash  . Other Hives, Swelling and Rash    Plastic shopping bags Metal staples: rash, swelling    CURRENT MEDICATIONS:  Current Outpatient Medications  Medication Instructions  . atenolol (TENORMIN) 25 MG tablet TAKE ONE (1) TABLET BY MOUTH EVERY DAY  . atorvastatin (LIPITOR) 40 MG tablet TAKE ONE (1) TABLET BY MOUTH EACH DAY AT6PM  . ELIQUIS 5 MG TABS tablet TAKE ONE (1) TABLET BY MOUTH TWO (2) TIMES DAILY  . fluticasone (FLONASE) 50 MCG/ACT nasal spray 2 sprays, Each Nare, Daily  . latanoprost (XALATAN) 0.005 % ophthalmic solution 1 drop, Both Eyes, Daily at bedtime  . levothyroxine (SYNTHROID) 75 MCG tablet TAKE ONE (1) TABLET BY MOUTH EVERY DAY  .  loratadine (CLARITIN) 10 mg, Oral, Daily PRN  . losartan (COZAAR) 25 MG tablet TAKE ONE (1) TABLET BY MOUTH EVERY DAY  . metFORMIN (GLUCOPHAGE-XR) 500 MG 24 hr tablet TAKE TWO (2) TABLETS BY MOUTH EVERY MORNING WITH BREAKFAST  . nitroGLYCERIN (NITROSTAT) 0.4 mg, Sublingual, Every 5 min PRN  . ONETOUCH DELICA LANCETS 07P MISC USE TO CHECK BLOOD SUGAR ONCE DAILY  . ONETOUCH ULTRA test strip USE 1 STRIP DAILY  . tamsulosin (FLOMAX) 0.4 MG CAPS capsule TAKE ONE CAPSULE BY MOUTH DAILY  . Vitamin D 2,000 Units, Oral, Daily    Objective:   PHYSICAL EXAMINATION:    VITALS:   Vitals:   09/05/20 0928  BP: 130/80  Pulse: 88  SpO2: 99%  Weight: 201 lb 12.8 oz (91.5  kg)  Height: 6' (1.829 m)    GEN:  The patient appears stated age and is in NAD. HEENT:  Normocephalic, atraumatic.  The mucous membranes are moist. The superficial temporal arteries are without ropiness or tenderness. CV:  RRR Lungs:  CTAB Neck/HEME:  There are no carotid bruits bilaterally.  Neurological examination:  Orientation: The patient is alert and oriented x3.  Cranial nerves: There is good facial symmetry.  Extraocular muscles are intact. The visual fields are full to confrontational testing. The speech is fluent and clear. Soft palate rises symmetrically and there is no tongue deviation. Hearing is intact to conversational tone. Sensation: Sensation is intact to light touch throughout (facial, trunk, extremities). Vibration is intact at the bilateral big toe. There is no extinction with double simultaneous stimulation.  Motor: Strength is 5/5 in the bilateral upper and lower extremities.   Shoulder shrug is equal and symmetric.  There is no pronator drift. Deep tendon reflexes: Deep tendon reflexes are 0-1/4 at the bilateral biceps, triceps, brachioradialis, patella and achilles. Plantar responses are downgoing bilaterally.  Movement examination: Tone: There is normal tone in the bilateral upper extremities.  The tone in the lower extremities is normal.  Abnormal movements: No rest tremor.  No postural tremor.  No intention tremor.  No trouble with Archimedes spirals bilaterally.  No trouble with pouring water from 1 glass to another.  No difficulty with a handwriting sample.  No evidence of dystonia with this.  No trouble when given a weight and asked to hold it. Coordination:  There is no decremation with RAM's, with any form of RAMS, including alternating supination and pronation of the forearm, hand opening and closing, finger taps, heel taps and toe taps. Gait and Station: The patient has no difficulty arising out of a deep-seated chair without the use of the hands. The  patient's stride length is good.   I have reviewed and interpreted the following labs independently   Chemistry      Component Value Date/Time   NA 139 09/01/2020 1057   NA 141 10/10/2019 1325   K 4.4 09/01/2020 1057   CL 106 09/01/2020 1057   CO2 26 09/01/2020 1057   BUN 26 (H) 09/01/2020 1057   BUN 21 10/10/2019 1325   CREATININE 1.00 09/01/2020 1057   CREATININE 1.16 (H) 04/03/2020 1044      Component Value Date/Time   CALCIUM 9.4 09/01/2020 1057   ALKPHOS 66 09/01/2020 1057   AST 14 09/01/2020 1057   ALT 15 09/01/2020 1057   BILITOT 0.7 09/01/2020 1057   BILITOT 0.5 09/08/2018 0904      Lab Results  Component Value Date   TSH 4.47 09/01/2020   Lab Results  Component  Value Date   WBC 7.2 09/01/2020   HGB 11.8 (L) 09/01/2020   HCT 36.2 (L) 09/01/2020   MCV 91.3 09/01/2020   PLT 190.0 09/01/2020      Total time spent on today's visit was 45 minutes, including both face-to-face time and nonface-to-face time.  Time included that spent on review of records (prior notes available to me/labs/imaging if pertinent), discussing treatment and goals, answering patient's questions and coordinating care.  Cc:  [name], MD

## 2020-09-05 ENCOUNTER — Ambulatory Visit: Payer: Medicare Other | Admitting: Neurology

## 2020-09-05 ENCOUNTER — Encounter: Payer: Self-pay | Admitting: Neurology

## 2020-09-05 ENCOUNTER — Other Ambulatory Visit: Payer: Self-pay

## 2020-09-05 VITALS — BP 130/80 | HR 88 | Ht 72.0 in | Wt 201.8 lb

## 2020-09-05 DIAGNOSIS — R251 Tremor, unspecified: Secondary | ICD-10-CM

## 2020-09-05 NOTE — Patient Instructions (Signed)
The physicians and staff at [facility] are committed to providing excellent care. You may receive a survey requesting feedback about your experience at our office. We strive to receive "very good" responses to the survey questions. If you feel that your experience would prevent you from giving the office a "very good " response, please contact our office to try to remedy the situation. We may be reached at [phone]. Thank you for taking the time out of your busy day to complete the survey.  

## 2020-09-09 DIAGNOSIS — M9903 Segmental and somatic dysfunction of lumbar region: Secondary | ICD-10-CM | POA: Diagnosis not present

## 2020-09-09 DIAGNOSIS — M545 Low back pain, unspecified: Secondary | ICD-10-CM | POA: Diagnosis not present

## 2020-09-11 ENCOUNTER — Other Ambulatory Visit: Payer: Self-pay | Admitting: Family Medicine

## 2020-09-16 DIAGNOSIS — H903 Sensorineural hearing loss, bilateral: Secondary | ICD-10-CM | POA: Diagnosis not present

## 2020-09-16 DIAGNOSIS — H9190 Unspecified hearing loss, unspecified ear: Secondary | ICD-10-CM | POA: Diagnosis not present

## 2020-09-16 DIAGNOSIS — H6983 Other specified disorders of Eustachian tube, bilateral: Secondary | ICD-10-CM | POA: Diagnosis not present

## 2020-09-16 DIAGNOSIS — L299 Pruritus, unspecified: Secondary | ICD-10-CM | POA: Diagnosis not present

## 2020-09-23 NOTE — Progress Notes (Signed)
HPI: FUAtrial fibrillation,TAA, AAA andcoronary artery disease. Hadcoronary artery bypassing graft in July of 2001 with a LIMA to the LAD, RIMA to the circumflex and saphenous vein graft to the PDA. Patient has known peripheral vascular disease followed by vascular surgery.Nuclear study March 2020 showed no ischemia. ABIs 3/21 showed moderate decrease on the left. CTA June 2021 showed 3 cm abdominal aortic aneurysm, 4.4 cm thoracic aortic aneurysm.  Had cardioversion of atrial fibrillation December 03, 2019. Abdominal ultrasound December 2021 showed 3.5 cm abdominal aortic aneurysm.  Echocardiogram January 2022 showed normal LV function with ejection fraction 60 to 65%, mild mitral regurgitation, mild to moderate tricuspid regurgitation, mild to moderate aortic insufficiency and mildly dilated ascending aorta at 40 mm.  Follow-up TEE showed normal LV function, mild left atrial enlargement, mild mitral regurgitation, mild aortic insufficiency and no vegetation.  Since last seenthere is no chest pain, dyspnea, palpitations, syncope or bleeding.  Current Outpatient Medications  Medication Sig Dispense Refill  . atenolol (TENORMIN) 25 MG tablet TAKE ONE (1) TABLET BY MOUTH EVERY DAY (Patient taking differently: Take 25 mg by mouth daily.) 90 tablet 1  . atorvastatin (LIPITOR) 40 MG tablet TAKE ONE (1) TABLET BY MOUTH EACH DAY AT6PM 30 tablet 11  . Cholecalciferol (VITAMIN D) 50 MCG (2000 UT) tablet Take 2,000 Units by mouth daily.     Marland Kitchen COVID-19 mRNA vaccine, Pfizer, 30 MCG/0.3ML injection INJECT AS DIRECTED .3 mL 0  . ELIQUIS 5 MG TABS tablet TAKE ONE (1) TABLET BY MOUTH TWO (2) TIMES DAILY 60 tablet 3  . fluticasone (FLONASE) 50 MCG/ACT nasal spray Place 2 sprays into both nostrils daily. 16 g 3  . latanoprost (XALATAN) 0.005 % ophthalmic solution Place 1 drop into both eyes at bedtime.     Marland Kitchen levothyroxine (SYNTHROID) 75 MCG tablet TAKE ONE (1) TABLET BY MOUTH EVERY DAY 90 tablet 1  .  loratadine (CLARITIN) 10 MG tablet Take 10 mg by mouth daily as needed for allergies.    Marland Kitchen losartan (COZAAR) 25 MG tablet TAKE ONE (1) TABLET BY MOUTH EVERY DAY (Patient taking differently: Take 25 mg by mouth daily.) 30 tablet 11  . metFORMIN (GLUCOPHAGE-XR) 500 MG 24 hr tablet TAKE TWO (2) TABLETS BY MOUTH EVERY MORNING WITH BREAKFAST (Patient taking differently: Take 1,000 mg by mouth in the morning and at bedtime.) 180 tablet 1  . nitroGLYCERIN (NITROSTAT) 0.4 MG SL tablet Place 1 tablet (0.4 mg total) under the tongue every 5 (five) minutes as needed for chest pain. 25 tablet 11  . ONETOUCH DELICA LANCETS 09O MISC USE TO CHECK BLOOD SUGAR ONCE DAILY 100 each 1  . ONETOUCH ULTRA test strip USE 1 STRIP DAILY 100 each 12  . tamsulosin (FLOMAX) 0.4 MG CAPS capsule TAKE ONE CAPSULE BY MOUTH DAILY 30 capsule 2   No current facility-administered medications for this visit.     Past Medical History:  Diagnosis Date  . Anemia 09/24/2013  . CAD (coronary artery disease)   . Cellulitis 05/14/2013   RLE  . Diverticulitis   . ED (erectile dysfunction)   . Glaucoma 08/18/2014  . History of sleep apnea    had surgery to correct  . Hypertension   . Hypothyroidism   . Medicare annual wellness visit, subsequent 04/13/2013  . Myocardial infarction ([place]) 11/05/1985  . Nocturia 03/15/2017  . Other and unspecified hyperlipidemia   . Penile lesion 02/24/2015  . Peripheral neuropathy 03/31/2012  . Peripheral vascular disease ([place])   .  Postoperative anemia due to acute blood loss 09/24/2013  . Preventative health care 03/13/2016  . Sleep apnea 09/16/2017  . Type II diabetes mellitus ([facility])    fasting 100-120  . Urinary urgency 11/22/2012    Past Surgical History:  Procedure Laterality Date  . ABDOMINAL AORTAGRAM  Oct. 9, 2014   Dr. Bridgett Larsson  . ABDOMINAL AORTAGRAM N/A 03/15/2013   Procedure: ABDOMINAL Maxcine Ham;  Surgeon: Conrad Oglesby, MD;  Location: [facility] CATH LAB;  Service: Cardiovascular;  Laterality: N/A;  .  ANTERIOR CERVICAL DECOMP/DISCECTOMY FUSION  ~ 2000  . CARDIOVERSION N/A 12/03/2019   Procedure: CARDIOVERSION;  Surgeon: Acie Fredrickson Wonda Cheng, MD;  Location: [facility];  Service: Cardiovascular;  Laterality: N/A;  . CATARACT EXTRACTION Right   . CORONARY ARTERY BYPASS GRAFT  2001   "CABG X3" (05/14/2013)  . DENTAL SURGERY     "multiple teeth removed; trmimed top of mouth so dentures would fit" (05/14/2013)  . EYE SURGERY     cataracts  . FEMORAL-POPLITEAL BYPASS GRAFT Right 04/25/2013   Procedure: RIGHT FEMORAL TO ABOVE KNEE POPLITEAL BYPASS GRAFT WITH RIGHT GREATER SAPHENOUS VEIN HARVEST; ULTRASOUND GUIDED;  Surgeon: Conrad [place], MD;  Location: [facility] OR;  Service: Vascular;  Laterality: Right;  . GUM SURGERY  [phone]   "had bone scraped; had periodontal disease" (05/14/2013)  . PALATE / UVULA BIOPSY / EXCISION  2003   Removed due to sleep apnea  . TEE WITHOUT CARDIOVERSION N/A 06/13/2020   Procedure: TRANSESOPHAGEAL ECHOCARDIOGRAM (TEE);  Surgeon: Geralynn Rile, MD;  Location: [facility];  Service: Cardiovascular;  Laterality: N/A;  . TONSILLECTOMY      Social History   Socioeconomic History  . Marital status: Married    Spouse name: Not on file  . Number of children: 8   . Years of education: Not on file  . Highest education level: Not on file  Occupational History    Employer: [employer]  Tobacco Use  . Smoking status: Former Smoker    Packs/day: 1.50    Years: 50.00    Pack years: 75.00    Types: Cigarettes  . Smokeless tobacco: Never Used  . Tobacco comment: 05/14/2013 "quit smoking in ~ 2004; still Uses Comit lozenges"  Vaping Use  . Vaping Use: Never used  Substance and Sexual Activity  . Alcohol use: No    Alcohol/week: 0.0 standard drinks  . Drug use: No  . Sexual activity: Yes    Comment: lives with wife, retiring end of next week, no dietary restrictions  Other Topics Concern  . 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 Difficulty of Paying Living Expenses: Somewhat hard  Food Insecurity: Not on file  Transportation Needs: Not on file  Physical Activity: Not on file  Stress: Not on file  Social Connections: Not on file  Intimate Partner Violence: Not on file    Family History  Problem Relation Age of Onset  . Hyperlipidemia Mother   . Heart disease Mother   . Diabetes Mother   . Hypertension Mother   . Heart disease Father   . Asthma Father   . Arthritis Father   . Heart disease Sister   . Heart disease Sister        s/p bypass x 2  . Lupus Sister   . Heart disease Brother  MI waiting on heart transplant when died  . Heart disease Brother        massive MI  . Cancer Neg Hx     ROS: no fevers or chills, productive cough, hemoptysis, dysphasia, odynophagia, melena, hematochezia, dysuria, hematuria, rash, seizure activity, orthopnea, PND, pedal edema, claudication. Remaining systems are negative.  Physical Exam: Well-developed well-nourished in no acute distress.  Skin is warm and dry.  HEENT is normal.  Neck is supple.  Chest is clear to auscultation with normal expansion.  Cardiovascular exam is irregular Abdominal exam nontender or distended. No masses palpated. Extremities show no edema. neuro grossly intact  ECG-atrial fibrillation with PVCs or aberrantly conducted beats, no ST changes.  Personally reviewed  A/P  1 permanent atrial fibrillation-plan is rate control and anticoagulation.  Continue beta-blocker and apixaban.  2 coronary artery disease-he denies chest pain.  Continue statin.  No aspirin given need for anticoagulation.  3 thoracic aortic aneurysm-plan follow-up CTA June 2022.  4 abdominal aortic aneurysm-plan follow-up ultrasound December 2022.  5 hypertension-blood pressure controlled.  Continue present medications and follow.  6  hyperlipidemia-continue statin.  7 mild to moderate aortic insufficiency-he will need follow-up ultrasounds in the future.  Kirk Ruths, MD

## 2020-09-29 ENCOUNTER — Other Ambulatory Visit: Payer: Self-pay | Admitting: Family Medicine

## 2020-10-01 ENCOUNTER — Ambulatory Visit: Payer: Medicare Other | Admitting: Cardiology

## 2020-10-01 ENCOUNTER — Encounter: Payer: Self-pay | Admitting: Cardiology

## 2020-10-01 ENCOUNTER — Other Ambulatory Visit: Payer: Self-pay

## 2020-10-01 VITALS — BP 124/76 | HR 77 | Ht 72.0 in | Wt 199.1 lb

## 2020-10-01 DIAGNOSIS — I712 Thoracic aortic aneurysm, without rupture, unspecified: Secondary | ICD-10-CM

## 2020-10-01 DIAGNOSIS — I359 Nonrheumatic aortic valve disorder, unspecified: Secondary | ICD-10-CM

## 2020-10-01 DIAGNOSIS — I714 Abdominal aortic aneurysm, without rupture, unspecified: Secondary | ICD-10-CM

## 2020-10-01 DIAGNOSIS — I2510 Atherosclerotic heart disease of native coronary artery without angina pectoris: Secondary | ICD-10-CM | POA: Diagnosis not present

## 2020-10-01 DIAGNOSIS — I4821 Permanent atrial fibrillation: Secondary | ICD-10-CM | POA: Diagnosis not present

## 2020-10-01 NOTE — Patient Instructions (Signed)
  Testing/Procedures:  CTA OF THE CHEST AT THE [facility] [facility] [facility]-1 ST FLOOR IMAGING DEPARTMENT   Follow-Up: At [facility], you and your health needs are our priority.  As part of our continuing mission to provide you with exceptional heart care, we have created designated Provider Care Teams.  These Care Teams include your primary Cardiologist (physician) and Advanced Practice Providers (APPs -  Physician Assistants and Nurse Practitioners) who all work together to provide you with the care you need, when you need it.  We recommend signing up for the patient portal called "MyChart".  Sign up information is provided on this After Visit Summary.  MyChart is used to connect with patients for Virtual Visits (Telemedicine).  Patients are able to view lab/test results, encounter notes, upcoming appointments, etc.  Non-urgent messages can be sent to your provider as well.   To learn more about what you can do with MyChart, go to [email or web address].    Your next appointment:   12 month(s)  The format for your next appointment:   In Person  Provider:   Kirk Ruths, MD

## 2020-10-07 ENCOUNTER — Ambulatory Visit (INDEPENDENT_AMBULATORY_CARE_PROVIDER_SITE_OTHER): Payer: Medicare Other | Admitting: Pharmacist

## 2020-10-07 DIAGNOSIS — I700 Atherosclerosis of aorta: Secondary | ICD-10-CM | POA: Diagnosis not present

## 2020-10-07 DIAGNOSIS — I10 Essential (primary) hypertension: Secondary | ICD-10-CM | POA: Diagnosis not present

## 2020-10-07 DIAGNOSIS — I739 Peripheral vascular disease, unspecified: Secondary | ICD-10-CM

## 2020-10-07 DIAGNOSIS — I4821 Permanent atrial fibrillation: Secondary | ICD-10-CM

## 2020-10-07 DIAGNOSIS — E1151 Type 2 diabetes mellitus with diabetic peripheral angiopathy without gangrene: Secondary | ICD-10-CM | POA: Diagnosis not present

## 2020-10-07 DIAGNOSIS — E782 Mixed hyperlipidemia: Secondary | ICD-10-CM

## 2020-10-07 DIAGNOSIS — R2681 Unsteadiness on feet: Secondary | ICD-10-CM

## 2020-10-07 NOTE — Chronic Care Management (AMB) (Signed)
Chronic Care Management Pharmacy Note  10/07/2020 Name:  Harry Brown MRN:  [id number] DOB:  [birth year]  Subjective: Harry Brown is an 80 y.o. year old male who is a primary patient of [name], MD.  The CCM team was consulted for assistance with disease management and care coordination needs.    Engaged with patient by telephone for follow up visit in response to provider referral for pharmacy case management and/or care coordination services.   Consent to Services:  The patient was given information about Chronic Care Management services, agreed to services, and gave verbal consent prior to initiation of services.  Please see initial visit note for detailed documentation.   Patient Care Team: [name], MD as PCP - General (Family Medicine) Conrad San German, MD as Consulting Physician (Vascular Surgery) Calvert Cantor, MD as Consulting Physician (Ophthalmology) Malachy Chamber, DDS as Consulting Physician (Periodontics) Cherre Robins, PharmD (Pharmacist)  Recent office visits: 09/01/2020 - PCP (Dr Charlett Blake) - f/u chronic med conditions; Started FLonase Nasal spray for secretory otitis media and referred to ENT.   Recent consult visits: 10/01/2020 - Cardio (Dr Stanford Breed) - f/u a fib, CAD and AAA. CABG 12/2019. No med change; CTA planned for June 2022 for thoracic aortic aneurysm.  09/16/2020 - Audiology [facility]) seen for hearing loss and ear fullness. No med changes but advised could use mineral or olive oil prn for ear cleaning; no Qtips 09/05/2020 - Neuro (Dr Tat) Tremor; no med changes.   Hospital visits: Medication Reconciliation was completed by comparing discharge summary, patient's EMR and Pharmacy list, and upon discussion with patient.  Admitted to the hospital on 06/09/2020 due to MSSA bacteremia. Discharge date was 06/13/2020. Discharged from  [place]?Medications Started at [facility] Discharge:?? PICC line placement for IV Ancef, plan  for total 4 weeks duration  Medication Changes at Hospital Discharge: -Changed - metformin held during hospitalization but restarted at discharge  Medications Discontinued at Hospital Discharge: -Stopped none  Medications that remain the same after Hospital Discharge:??  -All other medications will remain the same.    Objective:  Lab Results  Component Value Date   CREATININE 1.00 09/01/2020   CREATININE 0.88 06/18/2020   CREATININE 1.00 06/13/2020    Lab Results  Component Value Date   HGBA1C 6.7 (H) 09/01/2020   Last diabetic Eye exam:  Lab Results  Component Value Date/Time   HMDIABEYEEXA No Retinopathy 05/26/2018 12:00 AM    Last diabetic Foot exam: No results found for: HMDIABFOOTEX      Component Value Date/Time   CHOL 121 09/01/2020 1057   CHOL 122 09/08/2018 0904   TRIG 40.0 09/01/2020 1057   HDL 40.40 09/01/2020 1057   HDL 45 09/08/2018 0904   CHOLHDL 3 09/01/2020 1057   VLDL 8.0 09/01/2020 1057   LDLCALC 73 09/01/2020 1057   LDLCALC 67 04/03/2020 1044    Hepatic Function Latest Ref Rng & Units 09/01/2020 06/18/2020 06/10/2020  Total Protein 6.0 - 8.3 g/dL 6.5 6.7 6.6  Albumin 3.5 - 5.2 g/dL 4.1 3.9 3.4(L)  AST 0 - 37 U/L _0 ALT 0 - 53 U/L _1 Alk Phosphatase 39 - 117 U/L 66 88 58  Total Bilirubin 0.2 - 1.2 mg/dL 0.7 0.5 1.3(H)  Bilirubin, Direct 0.0 - 0.2 mg/dL - - -    Lab Results  Component Value Date/Time   TSH 4.47 09/01/2020 10:57 AM   TSH 3.70 04/03/2020 10:44 AM  FREET4 0.81 05/02/2018 10:53 AM   FREET4 1.08 12/16/2011 03:25 PM    CBC Latest Ref Rng & Units 09/01/2020 06/18/2020 06/13/2020  WBC 4.0 - 10.5 K/uL 7.2 11.9(H) 11.0(H)  Hemoglobin 13.0 - 17.0 g/dL 11.8(L) 11.6(L) 10.7(L)  Hematocrit 39.0 - 52.0 % 36.2(L) 36.0(L) 31.4(L)  Platelets 150.0 - 400.0 K/uL 190.0 272.0 218    No results found for: VD25OH  Clinical ASCVD: Yes  The ASCVD Risk score Mikey Bussing DC Jr., et al., 2013) failed to calculate for the following  reasons:   The 2013 ASCVD risk score is only valid for ages 44 to 2     Social History   Tobacco Use  Smoking Status Former Smoker  . Packs/day: 1.50  . Years: 50.00  . Pack years: 75.00  . Types: Cigarettes  Smokeless Tobacco Never Used  Tobacco Comment   05/14/2013 "quit smoking in ~ 2004; still Uses Comit lozenges"   BP Readings from Last 3 Encounters:  10/01/20 124/76  09/05/20 130/80  09/01/20 114/66   Pulse Readings from Last 3 Encounters:  10/01/20 77  09/05/20 88  09/01/20 90   Wt Readings from Last 3 Encounters:  10/01/20 199 lb 1.9 oz (90.3 kg)  09/05/20 201 lb 12.8 oz (91.5 kg)  09/01/20 202 lb 9.6 oz (91.9 kg)    Assessment: Review of patient past medical history, allergies, medications, health status, including review of consultants reports, laboratory and other test data, was performed as part of comprehensive evaluation and provision of chronic care management services.   SDOH:  (Social Determinants of Health) assessments and interventions performed:  SDOH Interventions   Flowsheet Row Most Recent Value  SDOH Interventions   Financial Strain Interventions --  [Plan to apply for patient assistance for Eliquis once he reaches 3% OOP spend.]  Physical Activity Interventions Patient Refused  [limited by unsteady gait]      CCM Care Plan  Allergies  Allergen Reactions  . Adhesive [Tape] Rash  . Latex Rash  . Other Hives, Swelling and Rash    Plastic shopping bags Metal staples: rash, swelling    Medications Reviewed Today    Reviewed by Lelon Perla, MD (Physician) on 10/01/20 at [phone]  Med List Status: <None>  Medication Order Taking? Sig Documenting Provider Last Dose Status Informant  atenolol (TENORMIN) 25 MG tablet [id number] Yes TAKE ONE (1) TABLET BY MOUTH EVERY DAY  Patient taking differently: Take 25 mg by mouth daily.   [name], MD Taking Active Self  atorvastatin (LIPITOR) 40 MG tablet [id number] Yes TAKE ONE (1) TABLET BY  MOUTH EACH DAY AT6PM [name], MD Taking Active   Cholecalciferol (VITAMIN D) 50 MCG (2000 UT) tablet [id number] Yes Take 2,000 Units by mouth daily.  [provider] Taking Active Self  COVID-19 mRNA vaccine, Pfizer, 30 MCG/0.3ML injection [id number] Yes INJECT AS DIRECTED Carlyle Basques, MD Taking Active   ELIQUIS 5 MG TABS tablet [id number] Yes TAKE ONE (1) TABLET BY MOUTH TWO (2) TIMES DAILY [name], MD Taking Active   fluticasone (FLONASE) 50 MCG/ACT nasal spray [id number] Yes Place 2 sprays into both nostrils daily. [name], MD Taking Active   latanoprost (XALATAN) 0.005 % ophthalmic solution [id number] Yes Place 1 drop into both eyes at bedtime.  [provider] Taking Active Self           Med Note Kenton Kingfisher, ABBIE R   Mon Feb 26, 2019  3:04 PM)    levothyroxine (SYNTHROID) 75 MCG  tablet [id number] Yes TAKE ONE (1) TABLET BY MOUTH EVERY DAY [name], MD Taking Active   loratadine (CLARITIN) 10 MG tablet [id number] Yes Take 10 mg by mouth daily as needed for allergies. [provider] Taking Active Self  losartan (COZAAR) 25 MG tablet [id number] Yes TAKE ONE (1) TABLET BY MOUTH EVERY DAY  Patient taking differently: Take 25 mg by mouth daily.   Sherran Needs, NP Taking Active Self  metFORMIN (GLUCOPHAGE-XR) 500 MG 24 hr tablet [id number] Yes TAKE TWO (2) [place] BREAKFAST  Patient taking differently: Take 1,000 mg by mouth in the morning and at bedtime.   [name], MD Taking Active Self  nitroGLYCERIN (NITROSTAT) 0.4 MG SL tablet [id number] Yes Place 1 tablet (0.4 mg total) under the tongue every 5 (five) minutes as needed for chest pain. Jenean Lindau, MD Taking Expired 02/06/20 2359 Self  Jonetta Speak LANCETS 38B MISC [id number] Yes USE TO CHECK BLOOD SUGAR ONCE DAILY [name], MD Taking Active Self  [facility] ULTRA test strip [id number] Yes USE 1 STRIP DAILY [name], MD Taking Active    tamsulosin (FLOMAX) 0.4 MG CAPS capsule [id number] Yes TAKE ONE CAPSULE BY MOUTH DAILY [name], MD Taking Active           Patient Active Problem List   Diagnosis Date Noted  . Unsteady gait 09/01/2020  . Allergies 09/01/2020  . SOM (secretory otitis media), bilateral 09/01/2020  . Encounter for therapeutic drug monitoring 07/01/2020  . Paresthesia of right lower extremity 04/03/2020  . Urinary frequency 11/19/2019  . Atrial fibrillation ([place]) 11/19/2019  . Bilateral shoulder pain 10/31/2018  . Educated about COVID-19 virus infection 10/31/2018  . DM (diabetes mellitus) with complications ([place]) 40/01/6760  . Tremor of right hand 05/02/2018  . Sleep apnea 09/16/2017  . Nocturia 03/15/2017  . Hypertension   . Preventative health care 03/13/2016  . Glaucoma 08/18/2014  . Need for viral immunization 09/24/2013  . Anemia 09/24/2013  . Lower back pain 05/14/2013  . Ambulatory dysfunction 05/14/2013  . Benign prostatic hyperplasia 05/14/2013  . Medicare annual wellness visit, subsequent 04/13/2013  . Abdominal aortic aneurysm ([place]) 04/03/2013  . Atherosclerosis of native arteries of extremity with intermittent claudication ([place]) 02/23/2013  . Onychomycosis 11/27/2012  . PVD (peripheral vascular disease) ([place]) 11/27/2012  . Neck pain on left side 11/22/2012  . Peripheral neuropathy 03/31/2012  . Bruit 12/29/2011  . Hypothyroidism 12/16/2011  . Type 2 diabetes mellitus with atherosclerosis of aorta ([place]) 12/16/2011  . Hyperlipidemia 12/16/2011  . Diverticulosis 12/16/2011    Immunization History  Administered Date(s) Administered  . Fluad Quad(high Dose 65+) 04/20/2019, 04/03/2020  . Influenza Split 03/31/2012  . Influenza, High Dose Seasonal PF 03/20/2013, 02/24/2015, 03/11/2016, 03/15/2017, 05/02/2018  . Influenza,inj,Quad PF,6+ Mos 01/29/2014  . Influenza,trivalent, recombinat, inj, PF 04/06/2011  . PFIZER(Purple Top)SARS-COV-2 Vaccination 06/27/2019, 07/18/2019,  03/14/2020  . Pneumococcal Conjugate-13 03/20/2013  . Pneumococcal Polysaccharide-23 10/11/2004, 02/24/2015  . Td 10/12/2003, 06/07/2009    Conditions to be addressed/monitored: Atrial Fibrillation, CAD, HTN, HLD, DMII and BPH; unsteady gait; OSA; hypothyroidism; PVD; AAA  Care Plan : General Pharmacy (Adult)  Updates made by Cherre Robins, PHARMD since 10/07/2020 12:00 AM    Problem: Chronic Disease Management support, education, and care coordination needs related to Hypertension, Hyperlipidemia/Atherosclerosis/PVD, Diabetes, Afib, Hypothyroidism, Allergic Rhinitis, BPH, Glaucoma   Priority: High  Onset Date: 10/07/2020  Note:   Current Barriers:  . Unable to independently afford treatment  regimen . Unable to maintain control of LDL at goal of <70 . Chronic Disease Management support, education, and care coordination needs related to Hypertension, Hyperlipidemia/Atherosclerosis/PVD, Diabetes, Afib, Hypothyroidism, Allergic Rhinitis, BPH, Glaucoma  Pharmacist Clinical Goal(s):  Marland Kitchen Over the next 180 days, patient will achieve control of hyperlipidemia as evidenced by LDL <70 . maintain control of BP and diabetes as evidenced by BP <130/80 and A1c <7.0%  . Apply for patient assistance for Eliquis 62m  through collaboration with PharmD and provider.   Interventions: . 1:1 collaboration with BMosie Lukes MD regarding development and update of comprehensive plan of care as evidenced by provider attestation and co-signature . Inter-disciplinary care team collaboration (see longitudinal plan of care) . Comprehensive medication review performed; medication list updated in electronic medical record  Hypertension . BP goal <130/80; Currently controlled . Current regimen:  . Atenolol 239mdaily  . Losartan 2557maily . Interventions: o Reviewed BP goal o Requested patient to check blood pressure once weekly and when symptomptic and record o Continue current medication regimen o Ensure  daily salt intake < 2300 mg/day  Hyperlipidemia/Atherosclerosis/PVD Lab Results  Component Value Date/Time   LDLCALC 73 09/01/2020 10:57 AM   LDLCALC 67 04/03/2020 10:44 AM   . LDL goal < 70; Last LDL was just a little above goal . Denies/reports hypotensive/hypertensive symptoms . Current regimen:  o Atorvastatin 10m41mily . Interventions: o Discussed diet and exercise o Discussed LDL goal o Encouraged patient to be as active as possible - even if its walking in home 2 or 3 times per day for 5 to 10 minutes.  o Maintain cholesterol medication regimen.   Diabetes Lab Results  Component Value Date/Time   HGBA1C 6.7 (H) 09/01/2020 10:57 AM   HGBA1C 6.5 (H) 04/03/2020 10:44 AM   . A1c goal <7%; Currently at goal . Current glucose readings: fasting glucose: 100 to 150 . Denies/reports hypoglycemic/hyperglycemic symptoms . Current regimen:  o Metformin XR 500mg39make 2 tablets = 1000mg 33m morning . Interventions: o Discussed diet and exercise o Reviewed home blood glucose readings and reviewed goals  - Fasting blood glucose goal (before meals) = 80 to 130 - Blood glucose goal after a meal = less than 180  o Continue to check blood sugar once daily, document, and provide at future appointments o Continue current regimen for diabetes  Artial fibrillation . CHADS2VASc score:  6 . Previous cardioversion failed . Current regimen:   Eliquis 5mg tw31m daily  Atenolol 25mg da42m . Interventions: o Screened for patient assistance program for Eliquis. Patient will need to spend 3% of income on medications prior to applying for program (Called [employer] currently spent $196 / 3% is about $860) o $33ed patient assistance application to patient   Medication management . Pharmacist Clinical Goal(s): o Over the next 90 days, patient will work with PharmD and providers to maintain optimal medication adherence . Current pharmacy: [facility] . Interventions o Comprehensive medication review performed. o Continue current medication management strategy . Patient self care activities - Over the next 90 days, patient will: o Focus on medication adherence by filling and taking medications appropriately o Take medications as prescribed o Report any questions or concerns to PharmD and/or provider(s)  Patient Goals/Self-Care Activities . Over the next 180 days, patient will:  take medications as prescribed, check glucose daily, document, and provide at future appointments, check blood pressure once a week, document, and provide at  future appointments, collaborate with provider on medication access solutions, and increase exercise as able  Follow Up Plan: Telephone follow up appointment with care management team member scheduled for:  6 months ; Will have pharmacy assistant follow up with patient in June or July to see if in Medicare coverage gap and OOP amount.       Please see past updates related to this goal by clicking on the "Past Updates" button in the selected goal       Medication Assistance: Mailed application for Eliquis. Will need to spend about $860 OOP before will qualify. Current OOP is $196. Plan to continue to follow.   Patient's preferred pharmacy is:  [facility], [place] - [address] [place] [address] Phone: [phone] Fax: [phone]   Follow Up:  Patient agrees to Care Plan and Follow-up.  Plan: Telephone follow up appointment with care management team member scheduled for:  6 months wiht clinical pharmacist; 2 months - pharmacy assistant will check in regarding Patient assistance for Eliquis.  Cherre Robins, PharmD Clinical Pharmacist [facility]

## 2020-10-07 NOTE — Patient Instructions (Signed)
Visit Information Mr. Harry Brown It was a pleasure speaking with you today. Marland Kitchen Below is information regarding you health goals. I have also included the application for Eliquis patient assistance. Please feel free to contact me if you have any questions or concerns  Keep up the good work!  Cherre Robins, PharmD Clinical Pharmacist [facility] [phone]  PATIENT GOALS: Goals Addressed            This Visit's Progress   . Chronic Care Management Pharmacy Care Plan       CARE PLAN ENTRY (see longitudinal plan of care for additional care plan information)  Current Barriers:  . Chronic Disease Management support, education, and care coordination needs related to Hypertension, Hyperlipidemia/Atherosclerosis/PVD, Diabetes, Afib, Hypothyroidism, Allergic Rhinitis, BPH, Glaucoma   Hypertension BP Readings from Last 3 Encounters:  10/01/20 124/76  09/05/20 130/80  09/01/20 114/66   . Pharmacist Clinical Goal(s): o Over the next 90 days, patient will work with PharmD and providers to maintain BP goal <130/80 . Current regimen:  . Atenolol 25mg  daily  . Losartan 25mg  daily . Interventions: o Reviewed goal o Requested patient to check blood pressure once weekly and when symptomptic and record . Patient self care activities - Over the next 90 days, patient will: o Check blood pressure once per week, document, and provide at future appointments o Ensure daily salt intake < 2300 mg/day  Hyperlipidemia/Atherosclerosis/PVD Lab Results  Component Value Date/Time   LDLCALC 73 09/01/2020 10:57 AM   LDLCALC 67 04/03/2020 10:44 AM   . Pharmacist Clinical Goal(s): o Over the next 90 days, patient will work with PharmD and providers to achieve LDL goal < 70 . Current regimen:  o Atorvastatin 40mg  daily . Interventions: o Discussed diet and exercise o Discussed LDL goal . Patient self care activities - Over the next 90 days, patient will: o Encourage you  to be as active as possible - even if its walking in home 2 or 3 times per day for 5 to 10 minutes.  o Maintain cholesterol medication regimen.   Diabetes Lab Results  Component Value Date/Time   HGBA1C 6.7 (H) 09/01/2020 10:57 AM   HGBA1C 6.5 (H) 04/03/2020 10:44 AM   . Pharmacist Clinical Goal(s): o Over the next 90 days, patient will work with PharmD and providers to maintain A1c goal <7% . Current regimen:  o Metformin XR 500mg  - take 2 tablets = 1000mg  each morning . Interventions: o Discussed diet and exercise o Reviewed home blood glucose readings and reviewed goals  - Fasting blood glucose goal (before meals) = 80 to 130 - Blood glucose goal after a meal = less than 180  . Patient self care activities - Over the next 90 days, patient will: o Check blood sugar once daily, document, and provide at future appointments o Contact provider with any episodes of hypoglycemia  Artial fibrillation . Pharmacist Clinical Goal(s) o Over the next 90 days, patient will work with PharmD and providers to reduce risk of stroke associated with Atrial fibrillaiton and reduce barrier to obtaining medication . Current regimen:   Eliquis 5mg  twice daily  Atenolol 25mg  daily  . Interventions: o Screened for patient assistance program for Eliquis. Patient will need to spend 3% of income on medications prior to applying for program ([place] and he has currently spent $196 / 3% is about $35) o Mailed patient assistance application to patient  . Patient self care activities - Over the next  90 days, patient will: o Contact providers if copay on Eliquis becomes a burden o Complete patient assistance application and we will send off once you reach yearly out of pocket spend of $860)  Medication management . Pharmacist Clinical Goal(s): o Over the next 90 days, patient will work with PharmD and providers to maintain optimal medication adherence . Current pharmacy: [facility] . Interventions o Comprehensive medication review performed. o Continue current medication management strategy . Patient self care activities - Over the next 90 days, patient will: o Focus on medication adherence by filling and taking medications appropriately o Take medications as prescribed o Report any questions or concerns to PharmD and/or provider(s)  Please see past updates related to this goal by clicking on the "Past Updates" button in the selected goal         The patient verbalized understanding of instructions, educational materials, and care plan provided today and agreed to receive a mailed copy of patient instructions, educational materials, and care plan.   Telephone follow up appointment with care management team member scheduled for: 6 months

## 2020-10-08 ENCOUNTER — Telehealth: Payer: Self-pay | Admitting: *Deleted

## 2020-10-08 DIAGNOSIS — I712 Thoracic aortic aneurysm, without rupture, unspecified: Secondary | ICD-10-CM

## 2020-10-08 NOTE — Telephone Encounter (Signed)
Spoke with pt, he is scheduled for the CTA of his chest to follow up on thoracic aneurysm 11/13/20 @ 1 pm in the [facility]. He is aware to be NPO 4 hours prior and the paperwork for bmp mailed to the patient.

## 2020-10-31 DIAGNOSIS — H401211 Low-tension glaucoma, right eye, mild stage: Secondary | ICD-10-CM | POA: Diagnosis not present

## 2020-10-31 DIAGNOSIS — E119 Type 2 diabetes mellitus without complications: Secondary | ICD-10-CM | POA: Diagnosis not present

## 2020-10-31 DIAGNOSIS — Z961 Presence of intraocular lens: Secondary | ICD-10-CM | POA: Diagnosis not present

## 2020-10-31 DIAGNOSIS — H401221 Low-tension glaucoma, left eye, mild stage: Secondary | ICD-10-CM | POA: Diagnosis not present

## 2020-11-07 DIAGNOSIS — I712 Thoracic aortic aneurysm, without rupture: Secondary | ICD-10-CM | POA: Diagnosis not present

## 2020-11-08 LAB — BASIC METABOLIC PANEL
BUN/Creatinine Ratio: 26 — ABNORMAL HIGH (ref 10–24)
BUN: 28 mg/dL — ABNORMAL HIGH (ref 8–27)
CO2: 22 mmol/L (ref 20–29)
Calcium: 9.2 mg/dL (ref 8.6–10.2)
Chloride: 105 mmol/L (ref 96–106)
Creatinine, Ser: 1.08 mg/dL (ref 0.76–1.27)
Glucose: 104 mg/dL — ABNORMAL HIGH (ref 65–99)
Potassium: 4.8 mmol/L (ref 3.5–5.2)
Sodium: 141 mmol/L (ref 134–144)
eGFR: 69 mL/min/{1.73_m2} (ref >59)

## 2020-11-11 ENCOUNTER — Ambulatory Visit (HOSPITAL_BASED_OUTPATIENT_CLINIC_OR_DEPARTMENT_OTHER)
Admission: RE | Admit: 2020-11-11 | Discharge: 2020-11-11 | Disposition: A | Discharge status: Home / Self Care | Payer: Medicare Other | Source: Ambulatory Visit | Attending: Cardiology | Admitting: Cardiology

## 2020-11-11 ENCOUNTER — Other Ambulatory Visit: Payer: Self-pay

## 2020-11-11 ENCOUNTER — Encounter (HOSPITAL_BASED_OUTPATIENT_CLINIC_OR_DEPARTMENT_OTHER): Payer: Self-pay

## 2020-11-11 DIAGNOSIS — J90 Pleural effusion, not elsewhere classified: Secondary | ICD-10-CM | POA: Diagnosis not present

## 2020-11-11 DIAGNOSIS — I712 Thoracic aortic aneurysm, without rupture, unspecified: Secondary | ICD-10-CM

## 2020-11-11 MED ORDER — IOHEXOL 350 MG/ML SOLN
100.0000 mL | Freq: Once | INTRAVENOUS | Status: AC | PRN
  Administered 2020-11-11: 100 mL via INTRAVENOUS

## 2020-11-13 ENCOUNTER — Other Ambulatory Visit (HOSPITAL_BASED_OUTPATIENT_CLINIC_OR_DEPARTMENT_OTHER): Payer: Medicare Other

## 2020-11-25 ENCOUNTER — Other Ambulatory Visit: Payer: Self-pay | Admitting: Family Medicine

## 2020-12-30 ENCOUNTER — Other Ambulatory Visit: Payer: Self-pay | Admitting: Family Medicine

## 2021-01-06 ENCOUNTER — Other Ambulatory Visit: Payer: Self-pay

## 2021-01-06 ENCOUNTER — Ambulatory Visit (HOSPITAL_COMMUNITY): Payer: Medicare Other | Attending: Cardiology

## 2021-01-06 DIAGNOSIS — I359 Nonrheumatic aortic valve disorder, unspecified: Secondary | ICD-10-CM | POA: Insufficient documentation

## 2021-01-06 LAB — ECHOCARDIOGRAM COMPLETE
Area-P 1/2: 3.96 cm2
P 1/2 time: 707 msec
S' Lateral: 3.8 cm

## 2021-01-13 ENCOUNTER — Other Ambulatory Visit: Payer: Self-pay | Admitting: Family Medicine

## 2021-02-11 ENCOUNTER — Other Ambulatory Visit (HOSPITAL_COMMUNITY): Payer: Self-pay | Admitting: Nurse Practitioner

## 2021-02-23 ENCOUNTER — Other Ambulatory Visit: Payer: Self-pay | Admitting: Family Medicine

## 2021-02-26 ENCOUNTER — Other Ambulatory Visit: Payer: Self-pay | Admitting: Family Medicine

## 2021-03-02 ENCOUNTER — Telehealth: Payer: Self-pay | Admitting: Family Medicine

## 2021-03-02 NOTE — Telephone Encounter (Signed)
Pt scheduled  

## 2021-03-02 NOTE — Telephone Encounter (Signed)
Patient picked up meds from his pharmacy and was advised to sched an appt with Charlett Blake. Patient is unsure what the appt is regarding. Please advise.

## 2021-03-25 ENCOUNTER — Ambulatory Visit (INDEPENDENT_AMBULATORY_CARE_PROVIDER_SITE_OTHER): Payer: Medicare Other

## 2021-03-25 VITALS — Ht 72.0 in | Wt 199.0 lb

## 2021-03-25 DIAGNOSIS — Z0000 Encounter for general adult medical examination without abnormal findings: Secondary | ICD-10-CM

## 2021-03-25 NOTE — Progress Notes (Addendum)
Subjective:   Harry Brown is a 80 y.o. male who presents for Medicare Annual/Subsequent preventive examination.   I connected with Hever today by telephone and verified that I am speaking with the correct person using two identifiers. Location patient: home Location provider: work Persons participating in the virtual visit: patient, Marine scientist.    I discussed the limitations, risks, security and privacy concerns of performing an evaluation and management service by telephone and the availability of in person appointments. I also discussed with the patient that there may be a patient responsible charge related to this service. The patient expressed understanding and verbally consented to this telephonic visit.    Interactive audio and video telecommunications were attempted between this provider and patient, however failed, due to patient having technical difficulties OR patient did not have access to video capability.  We continued and completed visit with audio only.  Some vital signs may be absent or patient reported.   Time Spent with patient on telephone encounter: 20 minutes   Review of Systems     Cardiac Risk Factors include: male gender;advanced age (>72men, >8 women);diabetes mellitus;dyslipidemia;hypertension;sedentary lifestyle     Objective:    Today's Vitals   03/25/21 1501  Weight: 199 lb (90.3 kg)  Height: 6' (1.829 m)   Body mass index is 26.99 kg/m.  Advanced Directives 03/25/2021 09/05/2020 06/13/2020 06/10/2020 06/08/2020 12/03/2019 11/10/2019  Does Patient Have a Medical Advance Directive? No No No No No Yes No  Type of Advance Directive - - - - - Press photographer;Living will -  Does patient want to make changes to medical advance directive? - - - - - - -  Copy of [place] in Chart? - - - - - No - copy requested -  Would patient like information on creating a medical advance directive? Yes (MAU/Ambulatory/Procedural Areas -  Information given) - Yes (Inpatient - patient defers creating a medical advance directive and declines information at this time) No - Patient declined - - -  Pre-existing out of facility DNR order (yellow form or pink MOST form) - - - - - - -    Current Medications (verified) Outpatient Encounter Medications as of 03/25/2021  Medication Sig   atenolol (TENORMIN) 25 MG tablet TAKE ONE (1) TABLET BY MOUTH EVERY DAY   atorvastatin (LIPITOR) 40 MG tablet TAKE ONE (1) TABLET BY MOUTH EACH DAY AT6PM   Cholecalciferol (VITAMIN D) 50 MCG (2000 UT) tablet Take 2,000 Units by mouth daily.    ELIQUIS 5 MG TABS tablet TAKE ONE (1) TABLET BY MOUTH TWO (2) TIMES DAILY   fluticasone (FLONASE) 50 MCG/ACT nasal spray Place 2 sprays into both nostrils daily.   latanoprost (XALATAN) 0.005 % ophthalmic solution Place 1 drop into both eyes at bedtime.    levothyroxine (SYNTHROID) 75 MCG tablet TAKE ONE (1) TABLET BY MOUTH EVERY DAY   loratadine (CLARITIN) 10 MG tablet Take 10 mg by mouth daily as needed for allergies.   losartan (COZAAR) 25 MG tablet TAKE ONE (1) TABLET BY MOUTH EVERY DAY   metFORMIN (GLUCOPHAGE-XR) 500 MG 24 hr tablet TAKE TWO (2) TABLETS BY MOUTH EVERY MORNING WITH BREAKFAST   ONETOUCH DELICA LANCETS 92E MISC USE TO CHECK BLOOD SUGAR ONCE DAILY   ONETOUCH ULTRA test strip USE 1 STRIP DAILY   tamsulosin (FLOMAX) 0.4 MG CAPS capsule TAKE ONE CAPSULE BY MOUTH DAILY   nitroGLYCERIN (NITROSTAT) 0.4 MG SL tablet Place 1 tablet (0.4 mg total) under the  tongue every 5 (five) minutes as needed for chest pain.   No facility-administered encounter medications on file as of 03/25/2021.    Allergies (verified) Adhesive [tape], Latex, and Other   History: Past Medical History:  Diagnosis Date   Anemia 09/24/2013   CAD (coronary artery disease)    Cellulitis 05/14/2013   RLE   Diverticulitis    ED (erectile dysfunction)    Glaucoma 08/18/2014   History of sleep apnea    had surgery to correct    Hypertension    Hypothyroidism    Medicare annual wellness visit, subsequent 04/13/2013   Myocardial infarction ([place]) 11/05/1985   Nocturia 03/15/2017   Other and unspecified hyperlipidemia    Penile lesion 02/24/2015   Peripheral neuropathy 03/31/2012   Peripheral vascular disease ([place])    Postoperative anemia due to acute blood loss 09/24/2013   Preventative health care 03/13/2016   Sleep apnea 09/16/2017   Type II diabetes mellitus ([place])    fasting 100-120   Urinary urgency 11/22/2012   Past Surgical History:  Procedure Laterality Date   ABDOMINAL AORTAGRAM  Oct. 9, 2014   Dr. Bridgett Larsson   ABDOMINAL Maxcine Ham N/A 03/15/2013   Procedure: ABDOMINAL Maxcine Ham;  Surgeon: Conrad [place], MD;  Location: [facility] CATH LAB;  Service: Cardiovascular;  Laterality: N/A;   ANTERIOR CERVICAL DECOMP/DISCECTOMY FUSION  ~ 2000   CARDIOVERSION N/A 12/03/2019   Procedure: CARDIOVERSION;  Surgeon: Acie Fredrickson, Wonda Cheng, MD;  Location: [facility] ENDOSCOPY;  Service: Cardiovascular;  Laterality: N/A;   CATARACT EXTRACTION Right    CORONARY ARTERY BYPASS GRAFT  2001   "CABG X3" (05/14/2013)   DENTAL SURGERY     "multiple teeth removed; trmimed top of mouth so dentures would fit" (05/14/2013)   EYE SURGERY     cataracts   FEMORAL-POPLITEAL BYPASS GRAFT Right 04/25/2013   Procedure: RIGHT FEMORAL TO ABOVE KNEE POPLITEAL BYPASS GRAFT WITH RIGHT [place]; ULTRASOUND GUIDED;  Surgeon: Conrad , MD;  Location: [facility] OR;  Service: Vascular;  Laterality: Right;   GUM SURGERY  [phone]   "had bone scraped; had periodontal disease" (05/14/2013)   PALATE / UVULA BIOPSY / EXCISION  2003   Removed due to sleep apnea   TEE WITHOUT CARDIOVERSION N/A 06/13/2020   Procedure: TRANSESOPHAGEAL ECHOCARDIOGRAM (TEE);  Surgeon: Geralynn Rile, MD;  Location: [place];  Service: Cardiovascular;  Laterality: N/A;   TONSILLECTOMY     Family History  Problem Relation Age of Onset   Hyperlipidemia Mother    Heart disease Mother     Diabetes Mother    Hypertension Mother    Heart disease Father    Asthma Father    Arthritis Father    Heart disease Sister    Heart disease Sister        s/p bypass x 2   Lupus Sister    Heart disease Brother        MI waiting on heart transplant when died   Heart disease Brother        massive MI   Cancer Neg Hx    Social History   Socioeconomic History   Marital status: Married    Spouse name: Not on file   Number of children: 8    Years of education: Not on file   Highest education level: Not on file  Occupational History    Employer: [employer]  Tobacco Use   Smoking status: Former    Packs/day: 1.50    Years: 50.00  Pack years: 75.00    Types: Cigarettes   Smokeless tobacco: Never   Tobacco comments:    05/14/2013 "quit smoking in ~ 2004; still Uses Comit lozenges"  Vaping Use   Vaping Use: Never used  Substance and Sexual Activity   Alcohol use: No    Alcohol/week: 0.0 standard drinks   Drug use: No   Sexual activity: Yes    Comment: lives with wife, retiring end of next week, no dietary restrictions  Other Topics Concern   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 [employer]   Social Determinants of Radio broadcast assistant Strain: Low Risk    Difficulty of Paying Living Expenses: Not very hard  Food Insecurity: No Food Insecurity   Worried About Charity fundraiser in the Last Year: Never true   Arboriculturist in the Last Year: Never true  Transportation Needs: No Transportation Needs   Lack of Transportation (Medical): No   Lack of Transportation (Non-Medical): No  Physical Activity: Inactive   Days of Exercise per Week: 0 days   Minutes of Exercise per Session: 0 min  Stress: No Stress Concern Present   Feeling of Stress : Not at all  Social Connections: Socially Integrated   Frequency of Communication with Friends and Family: More than three times a week    Frequency of Social Gatherings with Friends and Family: More than three times a week   Attends Religious Services: More than 4 times per year   Active Member of [employer] or Organizations: Yes   Attends Archivist Meetings: More than 4 times per year   Marital Status: Married    Tobacco Counseling Counseling given: Not Answered Tobacco comments: 05/14/2013 "quit smoking in ~ 2004; still Uses Comit lozenges"   Clinical Intake:  Pre-visit preparation completed: Yes  Pain : No/denies pain     BMI - recorded: 26.99 Nutritional Status: BMI 25 -29 Overweight Nutritional Risks: None Diabetes: Yes CBG done?: No Did pt. bring in CBG monitor from home?: No (phone visit)  How often do you need to have someone help you when you read instructions, pamphlets, or other written materials from your doctor or pharmacy?: 1 - Never  Diabetes:  Is the patient diabetic?  No  If diabetic, was a CBG obtained today?  No  Did the patient bring in their glucometer from home?  No phone visit How often do you monitor your CBG's? daily.   Financial Strains and Diabetes Management:  Are you having any financial strains with the device, your supplies or your medication? No .  Does the patient want to be seen by Chronic Care Management for management of their diabetes?  No  Would the patient like to be referred to a Nutritionist or for Diabetic Management?  No   Diabetic Exams:  Diabetic Eye Exam: Completed 02/2021. Per patient-awaiting notes.  Diabetic Foot Exam: Completed 09/01/2020.   Interpreter Needed?: No  Comments: Caroleen Hamman LPN   Activities of Daily Living In your present state of health, do you have any difficulty performing the following activities: 03/25/2021 06/10/2020  Hearing? N -  Vision? N -  Difficulty concentrating or making decisions? N -  Walking or climbing stairs? N -  Dressing or bathing? N -  Doing errands, shopping? N N  Preparing Food and eating ? N -   Using the Toilet? N -  In the past six  months, have you accidently leaked urine? N -  Do you have problems with loss of bowel control? N -  Managing your Medications? N -  Managing your Finances? N -  Housekeeping or managing your Housekeeping? N -  Some recent data might be hidden    Patient Care Team: [name], MD as PCP - General (Family Medicine) Conrad [place], MD as Consulting Physician (Vascular Surgery) Calvert Cantor, MD as Consulting Physician (Ophthalmology) Malachy Chamber, DDS as Consulting Physician (Periodontics) Cherre Robins, PharmD (Pharmacist)  Indicate any recent Medical Services you may have received from other than [facility] providers in the past year (date may be approximate).     Assessment:   This is a routine wellness examination for Harry Brown.  Hearing/Vision screen Hearing Screening - Comments:: No issues Vision Screening - Comments:: Last eye exam-02/2021-Dr. Eulas Post  Dietary issues and exercise activities discussed: Current Exercise Habits: The patient does not participate in regular exercise at present, Exercise limited by: None identified   Goals Addressed             This Visit's Progress    Patient Stated       Maintain current health       Depression Screen PHQ 2/9 Scores 03/25/2021 07/01/2020 05/15/2019 05/09/2018 09/13/2016 03/11/2016 02/24/2015  PHQ - 2 Score 0 0 0 0 0 0 0    Fall Risk Fall Risk  03/25/2021 10/07/2020 09/05/2020 07/01/2020 05/15/2019  Falls in the past year? 0 0 0 0 1  Number falls in past yr: 0 0 0 - 1  Injury with Fall? 0 - 0 - 0  Risk for fall due to : - Impaired balance/gait - No Fall Risks -  Follow up Falls prevention discussed Falls evaluation completed - Falls evaluation completed Education provided;Falls prevention discussed    FALL RISK PREVENTION PERTAINING TO THE HOME:  Any stairs in or around the home? Yes  If so, are there any without handrails? No  Home free of loose throw rugs in walkways, pet beds,  electrical cords, etc? Yes  Adequate lighting in your home to reduce risk of falls? Yes   ASSISTIVE DEVICES UTILIZED TO PREVENT FALLS:  Life alert? No  Use of a cane, walker or w/c? No  Grab bars in the bathroom? Yes  Shower chair or bench in shower? No  Elevated toilet seat or a handicapped toilet? No   TIMED UP AND GO:  Was the test performed? No . Phone visit   Cognitive Function:Normal cognitive status assessed by  this Nurse Health Advisor. No abnormalities found.   MMSE - Mini Mental State Exam 09/13/2016 03/11/2016  Orientation to time 5 5  Orientation to Place 5 5  Registration 3 3  Attention/ Calculation 4 5  Recall 2 3  Language- name 2 objects 2 2  Language- repeat 1 1  Language- follow 3 step command 3 3  Language- read & follow direction 1 1  Write a sentence 1 1  Copy design 1 1  Total score 28 30        Immunizations Immunization History  Administered Date(s) Administered   Fluad Quad(high Dose 65+) 04/20/2019, 04/03/2020   Influenza Split 03/31/2012   Influenza, High Dose Seasonal PF 03/20/2013, 02/24/2015, 03/11/2016, 03/15/2017, 05/02/2018   Influenza,inj,Quad PF,6+ Mos 01/29/2014   Influenza,trivalent, recombinat, inj, PF 04/06/2011   PFIZER(Purple Top)SARS-COV-2 Vaccination 06/27/2019, 07/18/2019, 03/14/2020   Pneumococcal Conjugate-13 03/20/2013   Pneumococcal Polysaccharide-23 10/11/2004, 02/24/2015   Td 10/12/2003, 06/07/2009    TDAP  status: Due, Education has been provided regarding the importance of this vaccine. Advised may receive this vaccine at local pharmacy or Health Dept. Aware to provide a copy of the vaccination record if obtained from local pharmacy or Health Dept. Verbalized acceptance and understanding.  Flu Vaccine status: Due, Education has been provided regarding the importance of this vaccine. Advised may receive this vaccine at local pharmacy or Health Dept. Aware to provide a copy of the vaccination record if obtained from  local pharmacy or Health Dept. Verbalized acceptance and understanding.  Pneumococcal vaccine status: Up to date  Covid-19 vaccine status: Information provided on how to obtain vaccines. Booster due  Qualifies for Shingles Vaccine? Yes   Zostavax completed No   Shingrix Completed?: No.    Education has been provided regarding the importance of this vaccine. Patient has been advised to call insurance company to determine out of pocket expense if they have not yet received this vaccine. Advised may also receive vaccine at local pharmacy or Health Dept. Verbalized acceptance and understanding.  Screening Tests Health Maintenance  Topic Date Due   Zoster Vaccines- Shingrix (1 of 2) Never done   OPHTHALMOLOGY EXAM  05/27/2019   TETANUS/TDAP  06/08/2019   COVID-19 Vaccine (4 - Booster for Pfizer series) 06/06/2020   INFLUENZA VACCINE  01/05/2021   HEMOGLOBIN A1C  03/04/2021   FOOT EXAM  09/01/2021   Pneumonia Vaccine 5+ Years old  Completed   HPV VACCINES  Aged Out    Health Maintenance  Health Maintenance Due  Topic Date Due   Zoster Vaccines- Shingrix (1 of 2) Never done   OPHTHALMOLOGY EXAM  05/27/2019   TETANUS/TDAP  06/08/2019   COVID-19 Vaccine (4 - Booster for Pfizer series) 06/06/2020   INFLUENZA VACCINE  01/05/2021   HEMOGLOBIN A1C  03/04/2021    Colorectal cancer screening: No longer required.   Lung Cancer Screening: (Low Dose CT Chest recommended if Age 28-80 years, 30 pack-year currently smoking OR have quit w/in 15years.) does not qualify.     Additional Screening:  Hepatitis C Screening: does not qualify  Vision Screening: Recommended annual ophthalmology exams for early detection of glaucoma and other disorders of the eye. Is the patient up to date with their annual eye exam?  Yes  Who is the provider or what is the name of the office in which the patient attends annual eye exams? Dr. Eulas Post   Dental Screening: Recommended annual dental exams for proper  oral hygiene  Community Resource Referral / Chronic Care Management: CRR required this visit?  No   CCM required this visit?  No      Plan:     I have personally reviewed and noted the following in the patient's chart:   Medical and social history Use of alcohol, tobacco or illicit drugs  Current medications and supplements including opioid prescriptions. Patient is not currently taking opioid prescriptions. Functional ability and status Nutritional status Physical activity Advanced directives List of other physicians Hospitalizations, surgeries, and ER visits in previous 12 months Vitals Screenings to include cognitive, depression, and falls Referrals and appointments  In addition, I have reviewed and discussed with patient certain preventive protocols, quality metrics, and best practice recommendations. A written personalized care plan for preventive services as well as general preventive health recommendations were provided to patient.   Due to this being a telephonic visit, the after visit summary with patients personalized plan was offered to patient via mail or my-chart. Patient would like to access on  my-chart.   Marta Antu, LPN   [id number]  Nurse Health Advisor  Nurse Notes: None    I have reviewed and agree with Health Coaches documentation.  Kathlene November, MD

## 2021-03-25 NOTE — Patient Instructions (Signed)
Harry Brown , Thank you for taking time to complete your Medicare Wellness Visit. I appreciate your ongoing commitment to your health goals. Please review the following plan we discussed and let me know if I can assist you in the future.   Screening recommendations/referrals: Colonoscopy: No longer required Recommended yearly ophthalmology/optometry visit for glaucoma screening and checkup Recommended yearly dental visit for hygiene and checkup  Vaccinations: Influenza vaccine:Due-May obtain vaccine at our office or your local pharmacy. Pneumococcal vaccine: Up to date Tdap vaccine: Discuss with pharmacy Shingles vaccine: Discuss with pharmacy   Covid-19: Covid booster available at the pharmacy.   Advanced directives: Information mailed today.  Conditions/risks identified: See problem list  Next appointment: Follow up in one year for your annual wellness visit. 03/26/2022 @ 3:40  Preventive Care 65 Years and Older, Male Preventive care refers to lifestyle choices and visits with your health care provider that can promote health and wellness. What does preventive care include? A yearly physical exam. This is also called an annual well check. Dental exams once or twice a year. Routine eye exams. Ask your health care provider how often you should have your eyes checked. Personal lifestyle choices, including: Daily care of your teeth and gums. Regular physical activity. Eating a healthy diet. Avoiding tobacco and drug use. Limiting alcohol use. Practicing safe sex. Taking low doses of aspirin every day. Taking vitamin and mineral supplements as recommended by your health care provider. What happens during an annual well check? The services and screenings done by your health care provider during your annual well check will depend on your age, overall health, lifestyle risk factors, and family history of disease. Counseling  Your health care provider may ask you questions about  your: Alcohol use. Tobacco use. Drug use. Emotional well-being. Home and relationship well-being. Sexual activity. Eating habits. History of falls. Memory and ability to understand (cognition). Work and work Statistician. Screening  You may have the following tests or measurements: Height, weight, and BMI. Blood pressure. Lipid and cholesterol levels. These may be checked every 5 years, or more frequently if you are over 21 years old. Skin check. Lung cancer screening. You may have this screening every year starting at age 64 if you have a 30-pack-year history of smoking and currently smoke or have quit within the past 15 years. Fecal occult blood test (FOBT) of the stool. You may have this test every year starting at age 44. Flexible sigmoidoscopy or colonoscopy. You may have a sigmoidoscopy every 5 years or a colonoscopy every 10 years starting at age 57. Prostate cancer screening. Recommendations will vary depending on your family history and other risks. Hepatitis C blood test. Hepatitis B blood test. Sexually transmitted disease (STD) testing. Diabetes screening. This is done by checking your blood sugar (glucose) after you have not eaten for a while (fasting). You may have this done every 1-3 years. Abdominal aortic aneurysm (AAA) screening. You may need this if you are a current or former smoker. Osteoporosis. You may be screened starting at age 27 if you are at high risk. Talk with your health care provider about your test results, treatment options, and if necessary, the need for more tests. Vaccines  Your health care provider may recommend certain vaccines, such as: Influenza vaccine. This is recommended every year. Tetanus, diphtheria, and acellular pertussis (Tdap, Td) vaccine. You may need a Td booster every 10 years. Zoster vaccine. You may need this after age 24. Pneumococcal 13-valent conjugate (PCV13) vaccine. One dose is  recommended after age 66. Pneumococcal  polysaccharide (PPSV23) vaccine. One dose is recommended after age 43. Talk to your health care provider about which screenings and vaccines you need and how often you need them. This information is not intended to replace advice given to you by your health care provider. Make sure you discuss any questions you have with your health care provider. Document Released: 06/20/2015 Document Revised: 02/11/2016 Document Reviewed: 03/25/2015 Elsevier Interactive Patient Education  2017 [place] Prevention in the Home Falls can cause injuries. They can happen to people of all ages. There are many things you can do to make your home safe and to help prevent falls. What can I do on the outside of my home? Regularly fix the edges of walkways and driveways and fix any cracks. Remove anything that might make you trip as you walk through a door, such as a raised step or threshold. Trim any bushes or trees on the path to your home. Use bright outdoor lighting. Clear any walking paths of anything that might make someone trip, such as rocks or tools. Regularly check to see if handrails are loose or broken. Make sure that both sides of any steps have handrails. Any raised decks and porches should have guardrails on the edges. Have any leaves, snow, or ice cleared regularly. Use sand or salt on walking paths during winter. Clean up any spills in your garage right away. This includes oil or grease spills. What can I do in the bathroom? Use night lights. Install grab bars by the toilet and in the tub and shower. Do not use towel bars as grab bars. Use non-skid mats or decals in the tub or shower. If you need to sit down in the shower, use a plastic, non-slip stool. Keep the floor dry. Clean up any water that spills on the floor as soon as it happens. Remove soap buildup in the tub or shower regularly. Attach bath mats securely with double-sided non-slip rug tape. Do not have throw rugs and other  things on the floor that can make you trip. What can I do in the bedroom? Use night lights. Make sure that you have a light by your bed that is easy to reach. Do not use any sheets or blankets that are too big for your bed. They should not hang down onto the floor. Have a firm chair that has side arms. You can use this for support while you get dressed. Do not have throw rugs and other things on the floor that can make you trip. What can I do in the kitchen? Clean up any spills right away. Avoid walking on wet floors. Keep items that you use a lot in easy-to-reach places. If you need to reach something above you, use a strong step stool that has a grab bar. Keep electrical cords out of the way. Do not use floor polish or wax that makes floors slippery. If you must use wax, use non-skid floor wax. Do not have throw rugs and other things on the floor that can make you trip. What can I do with my stairs? Do not leave any items on the stairs. Make sure that there are handrails on both sides of the stairs and use them. Fix handrails that are broken or loose. Make sure that handrails are as long as the stairways. Check any carpeting to make sure that it is firmly attached to the stairs. Fix any carpet that is loose or worn. Avoid having  throw rugs at the top or bottom of the stairs. If you do have throw rugs, attach them to the floor with carpet tape. Make sure that you have a light switch at the top of the stairs and the bottom of the stairs. If you do not have them, ask someone to add them for you. What else can I do to help prevent falls? Wear shoes that: Do not have high heels. Have rubber bottoms. Are comfortable and fit you well. Are closed at the toe. Do not wear sandals. If you use a stepladder: Make sure that it is fully opened. Do not climb a closed stepladder. Make sure that both sides of the stepladder are locked into place. Ask someone to hold it for you, if possible. Clearly  mark and make sure that you can see: Any grab bars or handrails. First and last steps. Where the edge of each step is. Use tools that help you move around (mobility aids) if they are needed. These include: Canes. Walkers. Scooters. Crutches. Turn on the lights when you go into a dark area. Replace any light bulbs as soon as they burn out. Set up your furniture so you have a clear path. Avoid moving your furniture around. If any of your floors are uneven, fix them. If there are any pets around you, be aware of where they are. Review your medicines with your doctor. Some medicines can make you feel dizzy. This can increase your chance of falling. Ask your doctor what other things that you can do to help prevent falls. This information is not intended to replace advice given to you by your health care provider. Make sure you discuss any questions you have with your health care provider. Document Released: 03/20/2009 Document Revised: 10/30/2015 Document Reviewed: 06/28/2014 Elsevier Interactive Patient Education  2017 [employer].

## 2021-03-30 ENCOUNTER — Other Ambulatory Visit: Payer: Self-pay | Admitting: Family Medicine

## 2021-04-09 ENCOUNTER — Ambulatory Visit (INDEPENDENT_AMBULATORY_CARE_PROVIDER_SITE_OTHER): Payer: Medicare Other | Admitting: Pharmacist

## 2021-04-09 DIAGNOSIS — I4821 Permanent atrial fibrillation: Secondary | ICD-10-CM

## 2021-04-09 DIAGNOSIS — E1151 Type 2 diabetes mellitus with diabetic peripheral angiopathy without gangrene: Secondary | ICD-10-CM

## 2021-04-09 DIAGNOSIS — E782 Mixed hyperlipidemia: Secondary | ICD-10-CM

## 2021-04-09 NOTE — Patient Instructions (Signed)
CARE PLAN ENTRY (see longitudinal plan of care for additional care plan information)  Current Barriers:  Chronic Disease Management support, education, and care coordination needs related to Hypertension, Hyperlipidemia/Atherosclerosis/PVD, Diabetes, Afib, Hypothyroidism, Allergic Rhinitis, BPH, Glaucoma   Hypertension BP Readings from Last 3 Encounters:  10/01/20 124/76  09/05/20 130/80  09/01/20 114/66   Pharmacist Clinical Goal(s): Over the next 90 days, patient will work with PharmD and providers to maintain BP goal <130/80 Current regimen:  Atenolol 25mg  daily  Losartan 25mg  daily Interventions: Reviewed goal Requested patient to check blood pressure once weekly and when symptomptic and record Patient self care activities - Over the next 90 days, patient will: Check blood pressure once per week, document, and provide at future appointments Ensure daily salt intake < 2300 mg/day  Hyperlipidemia/Atherosclerosis/PVD Lab Results  Component Value Date/Time   LDLCALC 73 09/01/2020 10:57 AM   LDLCALC 67 04/03/2020 10:44 AM   Pharmacist Clinical Goal(s): Over the next 90 days, patient will work with PharmD and providers to achieve LDL goal < 70 Current regimen:  Atorvastatin 40mg  daily Interventions: Discussed diet and exercise Discussed LDL goal Patient self care activities - Over the next 90 days, patient will: Encourage you to be as active as possible - even if its walking in home 2 or 3 times per day for 5 to 10 minutes.  Maintain cholesterol medication regimen.   Diabetes Lab Results  Component Value Date/Time   HGBA1C 6.7 (H) 09/01/2020 10:57 AM   HGBA1C 6.5 (H) 04/03/2020 10:44 AM   Pharmacist Clinical Goal(s): Over the next 90 days, patient will work with PharmD and providers to maintain A1c goal <7% Current regimen:  Metformin XR 500mg  - take 2 tablets = 1000mg  each morning Interventions: Discussed diet and exercise Reviewed home blood glucose readings and  reviewed goals  Fasting blood glucose goal (before meals) = 80 to 130 Blood glucose goal after a meal = less than 180  Patient self care activities - Over the next 90 days, patient will: Check blood sugar once daily, document, and provide at future appointments Contact provider with any episodes of hypoglycemia  Artial fibrillation Pharmacist Clinical Goal(s) Over the next 90 days, patient will work with PharmD and providers to reduce risk of stroke associated with Atrial fibrillaiton and reduce barrier to obtaining medication Current regimen:  Eliquis 5mg  twice daily Atenolol 25mg  daily  Interventions: Screened for patient assistance program for Eliquis. Patient will need to spend 3% of income on medications prior to applying for program ([place] and he has currently spent $196 / 3% is about $54) Left patient assistance application at front desk Patient self care activities - Over the next 90 days, patient will: Contact providers if copay on Eliquis becomes a burden Complete patient assistance application and we will send off once you reach yearly out of pocket spend of $860  Medication management Pharmacist Clinical Goal(s): Over the next 90 days, patient will work with PharmD and providers to maintain optimal medication adherence Current pharmacy: [facility] Comprehensive medication review performed. Continue current medication management strategy Patient self care activities - Over the next 90 days, patient will: Focus on medication adherence by filling and taking medications appropriately Take medications as prescribed Report any questions or concerns to PharmD and/or provider(s)  The patient verbalized understanding of instructions, educational materials, and care plan provided today and declined offer to receive copy of patient instructions, educational materials, and care plan.   Telephone follow up appointment with  care management team  member scheduled for: 1 month to check medication assistance program for Eliquis  Cherre Robins, PharmD Clinical Pharmacist [place]

## 2021-04-09 NOTE — Chronic Care Management (AMB) (Signed)
Chronic Care Management Pharmacy Note  04/09/2021 Name:  Harry Brown MRN:  [id number] DOB:  [birth year]  Subjective: Harry Brown is an 80 y.o. year old male who is a primary patient of [name], MD.  The CCM team was consulted for assistance with disease management and care coordination needs.    Engaged with patient by telephone for follow up visit in response to provider referral for pharmacy case management and/or care coordination services.   Consent to Services:  The patient was given information about Chronic Care Management services, agreed to services, and gave verbal consent prior to initiation of services.  Please see initial visit note for detailed documentation.   Patient Care Team: [name], MD as PCP - General (Family Medicine) Conrad [place], MD as Consulting Physician (Vascular Surgery) Calvert Cantor, MD as Consulting Physician (Ophthalmology) Malachy Chamber, DDS as Consulting Physician (Periodontics) Cherre Robins, RPH-CPP (Pharmacist)  Recent office visits: 09/01/2020 - PCP (Dr Charlett Blake) - f/u chronic med conditions; Started FLonase Nasal spray for secretory otitis media and referred to ENT.   Recent consult visits: 10/01/2020 - Cardio (Dr Stanford Breed) - f/u a fib, CAD and AAA. CABG 12/2019. No med change; CTA planned for June 2022 for thoracic aortic aneurysm.  09/16/2020 - Audiology [facility]) seen for hearing loss and ear fullness. No med changes but advised could use mineral or olive oil prn for ear cleaning; no Qtips 09/05/2020 - Neuro (Dr Tat) Tremor; no med changes.   Hospital visits: No hospitalizations in last 6 months    Objective:  Lab Results  Component Value Date   CREATININE 1.08 11/07/2020   CREATININE 1.00 09/01/2020   CREATININE 0.88 06/18/2020    Lab Results  Component Value Date   HGBA1C 6.7 (H) 09/01/2020   Last diabetic Eye exam:  Lab Results  Component Value Date/Time   HMDIABEYEEXA No Retinopathy 05/26/2018  12:00 AM    Last diabetic Foot exam: No results found for: HMDIABFOOTEX      Component Value Date/Time   CHOL 121 09/01/2020 1057   CHOL 122 09/08/2018 0904   TRIG 40.0 09/01/2020 1057   HDL 40.40 09/01/2020 1057   HDL 45 09/08/2018 0904   CHOLHDL 3 09/01/2020 1057   VLDL 8.0 09/01/2020 1057   LDLCALC 73 09/01/2020 1057   LDLCALC 67 04/03/2020 1044    Hepatic Function Latest Ref Rng & Units 09/01/2020 06/18/2020 06/10/2020  Total Protein 6.0 - 8.3 g/dL 6.5 6.7 6.6  Albumin 3.5 - 5.2 g/dL 4.1 3.9 3.4(L)  AST 0 - 37 U/L 14 27 20   ALT 0 - 53 U/L 15 11 20   Alk Phosphatase 39 - 117 U/L 66 88 58  Total Bilirubin 0.2 - 1.2 mg/dL 0.7 0.5 1.3(H)  Bilirubin, Direct 0.0 - 0.2 mg/dL - - -    Lab Results  Component Value Date/Time   TSH 4.47 09/01/2020 10:57 AM   TSH 3.70 04/03/2020 10:44 AM   FREET4 0.81 05/02/2018 10:53 AM   FREET4 1.08 12/16/2011 03:25 PM    CBC Latest Ref Rng & Units 09/01/2020 06/18/2020 06/13/2020  WBC 4.0 - 10.5 K/uL 7.2 11.9(H) 11.0(H)  Hemoglobin 13.0 - 17.0 g/dL 11.8(L) 11.6(L) 10.7(L)  Hematocrit 39.0 - 52.0 % 36.2(L) 36.0(L) 31.4(L)  Platelets 150.0 - 400.0 K/uL 190.0 272.0 218    No results found for: VD25OH  Clinical ASCVD: Yes  The ASCVD Risk score (Arnett DK, et al., 2019) failed to calculate for the following reasons:   The  2019 ASCVD risk score is only valid for ages 16 to 31     Social History   Tobacco Use  Smoking Status Former   Packs/day: 1.50   Years: 50.00   Pack years: 75.00   Types: Cigarettes  Smokeless Tobacco Never  Tobacco Comments   05/14/2013 "quit smoking in ~ 2004; still Uses Comit lozenges"   BP Readings from Last 3 Encounters:  10/01/20 124/76  09/05/20 130/80  09/01/20 114/66   Pulse Readings from Last 3 Encounters:  10/01/20 77  09/05/20 88  09/01/20 90   Wt Readings from Last 3 Encounters:  03/25/21 199 lb (90.3 kg)  10/01/20 199 lb 1.9 oz (90.3 kg)  09/05/20 201 lb 12.8 oz (91.5 kg)    Assessment: Review  of patient past medical history, allergies, medications, health status, including review of consultants reports, laboratory and other test data, was performed as part of comprehensive evaluation and provision of chronic care management services.   SDOH:  (Social Determinants of Health) assessments and interventions performed:  SDOH Interventions    Flowsheet Row Most Recent Value  SDOH Interventions   Financial Strain Interventions Other (Comment)  [provided patient assistance application]        CCM Care Plan  Allergies  Allergen Reactions   Adhesive [Tape] Rash   Latex Rash   Other Hives, Swelling and Rash    Plastic shopping bags Metal staples: rash, swelling    Medications Reviewed Today     Reviewed by Cherre Robins, RPH-CPP (Pharmacist) on 04/09/21 at 1430  Med List Status: <None>   Medication Order Taking? Sig Documenting Provider Last Dose Status Informant  atenolol (TENORMIN) 25 MG tablet [id number] Yes TAKE ONE (1) TABLET BY MOUTH EVERY DAY [name], MD Taking Active   atorvastatin (LIPITOR) 40 MG tablet [id number] Yes TAKE ONE (1) TABLET BY MOUTH EACH DAY AT6PM [name], MD Taking Active   Cholecalciferol (VITAMIN D) 50 MCG (2000 UT) tablet [id number] Yes Take 2,000 Units by mouth daily.  [provider] Taking Active Self  ELIQUIS 5 MG TABS tablet [id number] Yes TAKE ONE (1) TABLET BY MOUTH TWO (2) TIMES DAILY [name], MD Taking Active   fluticasone (FLONASE) 50 MCG/ACT nasal spray [id number] Yes Place 2 sprays into both nostrils daily. [name], MD Taking Active   latanoprost (XALATAN) 0.005 % ophthalmic solution [id number] Yes Place 1 drop into both eyes at bedtime.  [provider] Taking Active Self           Med Note Alric Ran Feb 26, 2019  3:04 PM)    levothyroxine (SYNTHROID) 75 MCG tablet [id number] Yes TAKE ONE (1) TABLET BY MOUTH EVERY DAY [name], MD Taking Active   loratadine (CLARITIN) 10 MG  tablet [id number] Yes Take 10 mg by mouth daily as needed for allergies. [provider] Taking Active Self  losartan (COZAAR) 25 MG tablet [id number] Yes TAKE ONE (1) TABLET BY MOUTH EVERY DAY Crenshaw, Denice Bors, MD Taking Active   metFORMIN (GLUCOPHAGE-XR) 500 MG 24 hr tablet [id number] Yes TAKE TWO (2) TABLETS BY MOUTH EVERY MORNING WITH BREAKFAST [name], MD Taking Active   nitroGLYCERIN (NITROSTAT) 0.4 MG SL tablet [id number]  Place 1 tablet (0.4 mg total) under the tongue every 5 (five) minutes as needed for chest pain. Jenean Lindau, MD  Expired 02/06/20 2359 Self  ONETOUCH DELICA LANCETS 14G MISC [id number] Yes USE TO CHECK BLOOD SUGAR ONCE DAILY Charlett Blake,  Bonnita Levan, MD Taking Active Self  Donald Siva test strip [id number] Yes USE 1 STRIP DAILY [name], MD Taking Active   tamsulosin (FLOMAX) 0.4 MG CAPS capsule [id number] Yes TAKE ONE CAPSULE BY MOUTH DAILY [name], MD Taking Active             Patient Active Problem List   Diagnosis Date Noted   Unsteady gait 09/01/2020   Allergies 09/01/2020   SOM (secretory otitis media), bilateral 09/01/2020   Encounter for therapeutic drug monitoring 07/01/2020   Paresthesia of right lower extremity 04/03/2020   Urinary frequency 11/19/2019   Atrial fibrillation ([place]) 11/19/2019   Bilateral shoulder pain 10/31/2018   Educated about COVID-19 virus infection 10/31/2018   DM (diabetes mellitus) with complications ([place]) 25/49/8264   Tremor of right hand 05/02/2018   Sleep apnea 09/16/2017   Nocturia 03/15/2017   Hypertension    Preventative health care 03/13/2016   Glaucoma 08/18/2014   Need for viral immunization 09/24/2013   Anemia 09/24/2013   Lower back pain 05/14/2013   Ambulatory dysfunction 05/14/2013   Benign prostatic hyperplasia 05/14/2013   Medicare annual wellness visit, subsequent 04/13/2013   Abdominal aortic aneurysm 04/03/2013   Atherosclerosis of native arteries of extremity with intermittent  claudication ([place]) 02/23/2013   Onychomycosis 11/27/2012   PVD (peripheral vascular disease) ([place]) 11/27/2012   Neck pain on left side 11/22/2012   Peripheral neuropathy 03/31/2012   Bruit 12/29/2011   Hypothyroidism 12/16/2011   Type 2 diabetes mellitus with atherosclerosis of aorta ([place]) 12/16/2011   Hyperlipidemia 12/16/2011   Diverticulosis 12/16/2011    Immunization History  Administered Date(s) Administered   Fluad Quad(high Dose 65+) 04/20/2019, 04/03/2020   Influenza Split 03/31/2012   Influenza, High Dose Seasonal PF 03/20/2013, 02/24/2015, 03/11/2016, 03/15/2017, 05/02/2018   Influenza,inj,Quad PF,6+ Mos 01/29/2014   Influenza,trivalent, recombinat, inj, PF 04/06/2011   PFIZER(Purple Top)SARS-COV-2 Vaccination 06/27/2019, 07/18/2019, 03/14/2020   Pneumococcal Conjugate-13 03/20/2013   Pneumococcal Polysaccharide-23 10/11/2004, 02/24/2015   Td 10/12/2003, 06/07/2009    Conditions to be addressed/monitored: Atrial Fibrillation, CAD, HTN, HLD, DMII and BPH; unsteady gait; OSA; hypothyroidism; PVD; AAA  Care Plan : General Pharmacy (Adult)  Updates made by Cherre Robins, RPH-CPP since 04/09/2021 12:00 AM     Problem: Chronic Disease Management support, education, and care coordination needs related to Hypertension, Hyperlipidemia/Atherosclerosis/PVD, Diabetes, Afib, Hypothyroidism, Allergic Rhinitis, BPH, Glaucoma   Priority: High  Onset Date: 10/07/2020  Note:   Current Barriers:  Unable to independently afford treatment regimen Unable to maintain control of LDL at goal of <70 Chronic Disease Management support, education, and care coordination needs related to Hypertension, Hyperlipidemia/Atherosclerosis/PVD, Diabetes, Afib, Hypothyroidism, Allergic Rhinitis, BPH, Glaucoma  Pharmacist Clinical Goal(s):  Over the next 180 days, patient will achieve control of hyperlipidemia as evidenced by LDL <70 maintain control of BP and diabetes as evidenced by BP <130/80 and A1c  <7.0%  Apply for patient assistance for Eliquis 23m  through collaboration with PharmD and provider.   Interventions: 1:1 collaboration with BMosie Lukes MD regarding development and update of comprehensive plan of care as evidenced by provider attestation and co-signature Inter-disciplinary care team collaboration (see longitudinal plan of care) Comprehensive medication review performed; medication list updated in electronic medical record  Hypertension BP goal <130/80; Currently controlled Current regimen:  Atenolol 236mdaily  Losartan 2535maily Interventions: Reviewed BP goal Requested patient to check blood pressure once weekly and when symptomptic and record Continue current medication regimen Ensure daily salt intake < 2300 mg/day  Hyperlipidemia/Atherosclerosis/PVD Lab Results  Component Value Date/Time   LDLCALC 73 09/01/2020 10:57 AM   LDLCALC 67 04/03/2020 10:44 AM  LDL goal < 70; Last LDL was just a little above goal Denies/reports hypotensive/hypertensive symptoms Current regimen:  Atorvastatin 38m daily Interventions: Discussed diet and exercise Discussed LDL goal Will have LDL checked next week at appt with PCP Encouraged patient to be as active as possible - even if its walking in home 2 or 3 times per day for 5 to 10 minutes.  Maintain cholesterol medication regimen.   Diabetes Lab Results  Component Value Date/Time   HGBA1C 6.7 (H) 09/01/2020 10:57 AM   HGBA1C 6.5 (H) 04/03/2020 10:44 AM  A1c goal <7%; Currently at goal Current glucose readings: fasting glucose: 100 to 150 Denies/reports hypoglycemic/hyperglycemic symptoms Current regimen:  Metformin XR 506m- take 2 tablets = 100071mach morning Interventions: Discussed diet and exercise Reviewed home blood glucose readings and reviewed goals  Fasting blood glucose goal (before meals) = 80 to 130 Blood glucose goal after a meal = less than 180  Continue to check blood sugar once daily,  document, and provide at future appointments Continue current regimen for diabetes  Artial fibrillation CHADS2VASc score:  6 Previous cardioversion failed Current regimen:  Eliquis 5mg65mice daily Atenolol 25mg33mly  Interventions: Screened for patient assistance program for Eliquis. Patient will need to spend 3% of income on medications prior to applying for program ([place]196 / 3% is about $860) Left patient assistance application at front desk for patient.   Medication management Pharmacist Clinical Goal(s): Over the next 90 days, patient will work with PharmD and providers to maintain optimal medication adherence Current pharmacy: [facility] Comprehensive medication review performed. Continue current medication management strategy Patient self care activities - Over the next 90 days, patient will: Focus on medication adherence by filling and taking medications appropriately Take medications as prescribed Report any questions or concerns to PharmD and/or provider(s)  Patient Goals/Self-Care Activities Over the next 180 days, patient will:  take medications as prescribed, check glucose daily, document, and provide at future appointments, check blood pressure once a week, document, and provide at future appointments, collaborate with provider on medication access solutions, and increase exercise as able  Follow Up Plan: Telephone follow up appointment with care management team member scheduled for:  1 month to f/u PAP ;       Medication Assistance:  Mailed application for Eliquis. Will need to spend about $860 OOP before will qualify.  Patient's preferred pharmacy is:  [facility]- [id number]: [phone] Up:  Patient agrees to Care Plan and Follow-up.  Plan: Telephone follow up appointment with  care management team member scheduled for:  1 month to check on medication assistance program for EliqiClinton [facility] Outpatient Surgery IncmyCherre RobinsrmD Clinical Pharmacist [facility]

## 2021-04-10 ENCOUNTER — Telehealth: Payer: Medicare Other

## 2021-04-13 ENCOUNTER — Other Ambulatory Visit: Payer: Self-pay

## 2021-04-13 ENCOUNTER — Ambulatory Visit: Payer: Medicare Other | Attending: Internal Medicine

## 2021-04-13 ENCOUNTER — Ambulatory Visit (INDEPENDENT_AMBULATORY_CARE_PROVIDER_SITE_OTHER): Payer: Medicare Other | Admitting: Family Medicine

## 2021-04-13 ENCOUNTER — Encounter: Payer: Self-pay | Admitting: Family Medicine

## 2021-04-13 VITALS — BP 116/64 | HR 49 | Temp 98.0°F | Resp 16 | Wt 196.2 lb

## 2021-04-13 DIAGNOSIS — I4821 Permanent atrial fibrillation: Secondary | ICD-10-CM

## 2021-04-13 DIAGNOSIS — E039 Hypothyroidism, unspecified: Secondary | ICD-10-CM

## 2021-04-13 DIAGNOSIS — D649 Anemia, unspecified: Secondary | ICD-10-CM | POA: Diagnosis not present

## 2021-04-13 DIAGNOSIS — E1151 Type 2 diabetes mellitus with diabetic peripheral angiopathy without gangrene: Secondary | ICD-10-CM

## 2021-04-13 DIAGNOSIS — Z23 Encounter for immunization: Secondary | ICD-10-CM

## 2021-04-13 DIAGNOSIS — R2681 Unsteadiness on feet: Secondary | ICD-10-CM | POA: Diagnosis not present

## 2021-04-13 DIAGNOSIS — E782 Mixed hyperlipidemia: Secondary | ICD-10-CM | POA: Diagnosis not present

## 2021-04-13 DIAGNOSIS — I700 Atherosclerosis of aorta: Secondary | ICD-10-CM | POA: Diagnosis not present

## 2021-04-13 DIAGNOSIS — E1159 Type 2 diabetes mellitus with other circulatory complications: Secondary | ICD-10-CM

## 2021-04-13 DIAGNOSIS — R42 Dizziness and giddiness: Secondary | ICD-10-CM | POA: Diagnosis not present

## 2021-04-13 DIAGNOSIS — I10 Essential (primary) hypertension: Secondary | ICD-10-CM

## 2021-04-13 NOTE — Patient Instructions (Addendum)
If you want to see urology secondary to getting up many times a night to urinate let us know   Paxlovid and Molnupiravir are the new COVID medication we can give you if you get COVID so make sure you test if you have symptoms because we have to treat by day 5 of symptoms for it to be effective. If you are positive let us know so we can treat. If a home test is negative and your symptoms are persistent get a PCR test. Can check testing locations at [email or web address].com If you are positive we will make an appointment with [place] and we will send in Paxlovid or Molnupiravir if you would like it. Check with your pharmacy before we meet to confirm they have it in stock, if they do not then we can get the prescription at the [place]   Shingrix is the new shingles shot, 2 shots over 2-6 months, confirm coverage with insurance and document, then can return here for shots with nurse appt or at pharmacy

## 2021-04-13 NOTE — Progress Notes (Signed)
Subjective:   By signing my name below, I, Harry Brown, attest that this documentation has been prepared under the direction and in the presence of [name], MD. 04/13/2021     Patient ID: Harry Brown, male    DOB: 1940-07-08, 80 y.o.   MRN: [id number]  No chief complaint on file.   HPI Patient is in today for an office visit and follow-up. He is accompanied by his wife today.  He mentions he was having problems with his feet because he was having muscle cramps in his legs and feet. He has to get up at night to walk around and is now using revitive circulation booster which he says is providing great relief.  He reports sometimes he has loss of balance when he first stands up and is unsteady on his feet. He likens it to walking like a drunk and it happens more than a couple of times in a week. His wife mentions it does not happen all day and he tries to hide it and compensate. The unsteadiness is more common when he first stands up and occurs at random times during the day. Denies falls, vertigo and the gait is never focused on one side more than the other. He has never noticed that one leg is weaker than the other. He has check-ups with his vascular surgeon every 6 months.  He reports he has nocturia and has to wake up 2-4 times in the night to urinate. Denies dysuria. He adds that this does not happen every night.  He is UTD on vision care.   He received the flu and Covid-19 booster vaccines today.  Past Medical History:  Diagnosis Date   Anemia 09/24/2013   CAD (coronary artery disease)    Cellulitis 05/14/2013   RLE   Diverticulitis    ED (erectile dysfunction)    Glaucoma 08/18/2014   History of sleep apnea    had surgery to correct   Hypertension    Hypothyroidism    Medicare annual wellness visit, subsequent 04/13/2013   Myocardial infarction ([place]) 11/05/1985   Nocturia 03/15/2017   Other and unspecified hyperlipidemia    Penile lesion 02/24/2015   Peripheral  neuropathy 03/31/2012   Peripheral vascular disease ([place])    Postoperative anemia due to acute blood loss 09/24/2013   Preventative health care 03/13/2016   Sleep apnea 09/16/2017   Type II diabetes mellitus ([place])    fasting 100-120   Urinary urgency 11/22/2012    Past Surgical History:  Procedure Laterality Date   ABDOMINAL AORTAGRAM  Oct. 9, 2014   Dr. Bridgett Larsson   ABDOMINAL Maxcine Ham N/A 03/15/2013   Procedure: ABDOMINAL Maxcine Ham;  Surgeon: Conrad Hamlet, MD;  Location: [facility] CATH LAB;  Service: Cardiovascular;  Laterality: N/A;   ANTERIOR CERVICAL DECOMP/DISCECTOMY FUSION  ~ 2000   CARDIOVERSION N/A 12/03/2019   Procedure: CARDIOVERSION;  Surgeon: Acie Fredrickson, Wonda Cheng, MD;  Location: [facility] ENDOSCOPY;  Service: Cardiovascular;  Laterality: N/A;   CATARACT EXTRACTION Right    CORONARY ARTERY BYPASS GRAFT  2001   "CABG X3" (05/14/2013)   DENTAL SURGERY     "multiple teeth removed; trmimed top of mouth so dentures would fit" (05/14/2013)   EYE SURGERY     cataracts   FEMORAL-POPLITEAL BYPASS GRAFT Right 04/25/2013   Procedure: RIGHT FEMORAL TO ABOVE KNEE POPLITEAL BYPASS GRAFT WITH RIGHT [place]; ULTRASOUND GUIDED;  Surgeon: Conrad Letona, MD;  Location: [place];  Service: Vascular;  Laterality: Right;  GUM SURGERY  1990's   "had bone scraped; had periodontal disease" (05/14/2013)   PALATE / UVULA BIOPSY / EXCISION  2003   Removed due to sleep apnea   TEE WITHOUT CARDIOVERSION N/A 06/13/2020   Procedure: TRANSESOPHAGEAL ECHOCARDIOGRAM (TEE);  Surgeon: Geralynn Rile, MD;  Location: [place];  Service: Cardiovascular;  Laterality: N/A;   TONSILLECTOMY      Family History  Problem Relation Age of Onset   Hyperlipidemia Mother    Heart disease Mother    Diabetes Mother    Hypertension Mother    Heart disease Father    Asthma Father    Arthritis Father    Heart disease Sister    Heart disease Sister        s/p bypass x 2   Lupus Sister    Heart disease Brother         MI waiting on heart transplant when died   Heart disease Brother        massive MI   Cancer Neg Hx     Social History   Socioeconomic History   Marital status: Married    Spouse name: Not on file   Number of children: 8    Years of education: Not on file   Highest education level: Not on file  Occupational History    Employer: [employer]  Tobacco Use   Smoking status: Former    Packs/day: 1.50    Years: 50.00    Pack years: 75.00    Types: Cigarettes   Smokeless tobacco: Never   Tobacco comments:    05/14/2013 "quit smoking in ~ 2004; still Uses Comit lozenges"  Vaping Use   Vaping Use: Never used  Substance and Sexual Activity   Alcohol use: No    Alcohol/week: 0.0 standard drinks   Drug use: No   Sexual activity: Yes    Comment: lives with wife, retiring end of next week, no dietary restrictions  Other Topics Concern   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 [employer]   Social Determinants of Radio broadcast assistant Strain: Medium Risk   Difficulty of Paying Living Expenses: Somewhat hard  Food Insecurity: No Food Insecurity   Worried About Charity fundraiser in the Last Year: Never true   Arboriculturist in the Last Year: Never true  Transportation Needs: No Transportation Needs   Lack of Transportation (Medical): No   Lack of Transportation (Non-Medical): No  Physical Activity: Inactive   Days of Exercise per Week: 0 days   Minutes of Exercise per Session: 0 min  Stress: No Stress Concern Present   Feeling of Stress : Not at all  Social Connections: Socially Integrated   Frequency of Communication with Friends and Family: More than three times a week   Frequency of Social Gatherings with Friends and Family: More than three times a week   Attends Religious Services: More than 4 times per year   Active Member of [employer] or Organizations: Yes   Attends Arts administrator: More than 4 times per year   Marital Status: Married  Human resources officer Violence: Not At Risk   Fear of Current or Ex-Partner: No   Emotionally Abused: No   Physically Abused: No   Sexually Abused: No    Outpatient Medications Prior to Visit  Medication Sig Dispense Refill   atenolol (TENORMIN) 25 MG tablet TAKE  ONE (1) TABLET BY MOUTH EVERY DAY 30 tablet 0   atorvastatin (LIPITOR) 40 MG tablet TAKE ONE (1) TABLET BY MOUTH EACH DAY AT6PM 30 tablet 11   Cholecalciferol (VITAMIN D) 50 MCG (2000 UT) tablet Take 2,000 Units by mouth daily.      ELIQUIS 5 MG TABS tablet TAKE ONE (1) TABLET BY MOUTH TWO (2) TIMES DAILY 60 tablet 3   latanoprost (XALATAN) 0.005 % ophthalmic solution Place 1 drop into both eyes at bedtime.      levothyroxine (SYNTHROID) 75 MCG tablet TAKE ONE (1) TABLET BY MOUTH EVERY DAY 90 tablet 1   loratadine (CLARITIN) 10 MG tablet Take 10 mg by mouth daily as needed for allergies.     losartan (COZAAR) 25 MG tablet TAKE ONE (1) TABLET BY MOUTH EVERY DAY 30 tablet 5   metFORMIN (GLUCOPHAGE-XR) 500 MG 24 hr tablet TAKE TWO (2) TABLETS BY MOUTH EVERY MORNING WITH BREAKFAST 60 tablet 5   ONETOUCH DELICA LANCETS 08Q MISC USE TO CHECK BLOOD SUGAR ONCE DAILY 100 each 1   ONETOUCH ULTRA test strip USE 1 STRIP DAILY 100 each 12   tamsulosin (FLOMAX) 0.4 MG CAPS capsule TAKE ONE CAPSULE BY MOUTH DAILY 30 capsule 5   fluticasone (FLONASE) 50 MCG/ACT nasal spray Place 2 sprays into both nostrils daily. 16 g 3   nitroGLYCERIN (NITROSTAT) 0.4 MG SL tablet Place 1 tablet (0.4 mg total) under the tongue every 5 (five) minutes as needed for chest pain. 25 tablet 11   No facility-administered medications prior to visit.    Allergies  Allergen Reactions   Adhesive [Tape] Rash   Latex Rash   Other Hives, Swelling and Rash    Plastic shopping bags Metal staples: rash, swelling    Review of Systems  Constitutional:  Negative for fever and malaise/fatigue.  HENT:  Negative  for congestion.   Eyes:  Negative for redness.  Respiratory:  Negative for shortness of breath.   Cardiovascular:  Negative for chest pain, palpitations and leg swelling.  Gastrointestinal:  Negative for abdominal pain, blood in stool and nausea.  Genitourinary:  Negative for dysuria and frequency.       (+) nocturia  Musculoskeletal:  Negative for falls.  Skin:  Negative for rash.  Neurological:  Negative for dizziness, loss of consciousness and headaches.       (+) loss of balance  Endo/Heme/Allergies:  Negative for polydipsia.  Psychiatric/Behavioral:  Negative for depression. The patient is not nervous/anxious.       Objective:    Physical Exam Constitutional:      Appearance: Normal appearance. He is not ill-appearing.  HENT:     Head: Normocephalic and atraumatic.     Right Ear: Tympanic membrane, ear canal and external ear normal.     Left Ear: Tympanic membrane, ear canal and external ear normal.  Eyes:     Conjunctiva/sclera: Conjunctivae normal.  Cardiovascular:     Rate and Rhythm: Normal rate. Rhythm irregular.     Heart sounds: Normal heart sounds. No murmur heard. Pulmonary:     Breath sounds: Normal breath sounds. No wheezing.  Abdominal:     General: Bowel sounds are normal. There is no distension.     Palpations: Abdomen is soft.     Tenderness: There is no abdominal tenderness.     Hernia: No hernia is present.  Musculoskeletal:     Cervical back: Neck supple.  Lymphadenopathy:     Cervical: No cervical adenopathy.  Skin:  General: Skin is warm and dry.  Neurological:     Mental Status: He is alert and oriented to person, place, and time.  Psychiatric:        Behavior: Behavior normal.    BP 116/64   Pulse (!) 49   Temp 98 F (36.7 C)   Resp 16   Wt 196 lb 3.2 oz (89 kg)   SpO2 99%   BMI 26.61 kg/m  Wt Readings from Last 3 Encounters:  04/13/21 196 lb 3.2 oz (89 kg)  03/25/21 199 lb (90.3 kg)  10/01/20 199 lb 1.9 oz (90.3 kg)     Diabetic Foot Exam - Simple   No data filed    Lab Results  Component Value Date   WBC 7.2 09/01/2020   HGB 11.8 (L) 09/01/2020   HCT 36.2 (L) 09/01/2020   PLT 190.0 09/01/2020   GLUCOSE 104 (H) 11/07/2020   CHOL 121 09/01/2020   TRIG 40.0 09/01/2020   HDL 40.40 09/01/2020   LDLCALC 73 09/01/2020   ALT 15 09/01/2020   AST 14 09/01/2020   NA 141 11/07/2020   K 4.8 11/07/2020   CL 105 11/07/2020   CREATININE 1.08 11/07/2020   BUN 28 (H) 11/07/2020   CO2 22 11/07/2020   TSH 4.47 09/01/2020   PSA 1.47 05/02/2018   INR 1.5 (H) 06/08/2020   HGBA1C 6.7 (H) 09/01/2020   MICROALBUR 0.7 03/11/2016    Lab Results  Component Value Date   TSH 4.47 09/01/2020   Lab Results  Component Value Date   WBC 7.2 09/01/2020   HGB 11.8 (L) 09/01/2020   HCT 36.2 (L) 09/01/2020   MCV 91.3 09/01/2020   PLT 190.0 09/01/2020   Lab Results  Component Value Date   NA 141 11/07/2020   K 4.8 11/07/2020   CO2 22 11/07/2020   GLUCOSE 104 (H) 11/07/2020   BUN 28 (H) 11/07/2020   CREATININE 1.08 11/07/2020   BILITOT 0.7 09/01/2020   ALKPHOS 66 09/01/2020   AST 14 09/01/2020   ALT 15 09/01/2020   PROT 6.5 09/01/2020   ALBUMIN 4.1 09/01/2020   CALCIUM 9.2 11/07/2020   ANIONGAP 6 06/13/2020   EGFR 69 11/07/2020   GFR 70.95 09/01/2020   Lab Results  Component Value Date   CHOL 121 09/01/2020   Lab Results  Component Value Date   HDL 40.40 09/01/2020   Lab Results  Component Value Date   LDLCALC 73 09/01/2020   Lab Results  Component Value Date   TRIG 40.0 09/01/2020   Lab Results  Component Value Date   CHOLHDL 3 09/01/2020   Lab Results  Component Value Date   HGBA1C 6.7 (H) 09/01/2020       Assessment & Plan:   Problem List Items Addressed This Visit     Hypothyroidism    On Levothyroxine, continue to monitor      Relevant Orders   CBC with Differential/Platelet   Comprehensive metabolic panel   Lipid panel   Type 2 diabetes mellitus with  atherosclerosis of aorta (HCC)    hgba1c acceptable, minimize simple carbs. Increase exercise as tolerated. Continue current meds      Relevant Orders   Hemoglobin A1c   Hyperlipidemia    Tolerating statin, encouraged heart healthy diet, avoid trans fats, minimize simple carbs and saturated fats. Increase exercise as tolerated      Relevant Orders   CBC with Differential/Platelet   Comprehensive metabolic panel   Lipid panel   Anemia  Relevant Orders   CBC with Differential/Platelet   Comprehensive metabolic panel   TSH   Lipid panel   Hypertension    Well controlled, no changes to meds. Encouraged heart healthy diet such as the DASH diet and exercise as tolerated.       Relevant Orders   TSH   Atrial fibrillation (HCC)    Rate controlled and tolerating meds. Follows with cardiology      Unsteady gait when walking    Disequilibrium, weakness. Referred for physical therapy to try and help his ambulation and increase his strength and endurance.       Relevant Orders   Ambulatory referral to Physical Therapy   Other Visit Diagnoses     Need for influenza vaccination    -  Primary   Relevant Orders   Flu Vaccine QUAD High Dose(Fluad) (Completed)   Disequilibrium       Relevant Orders   Ambulatory referral to Physical Therapy       No orders of the defined types were placed in this encounter.   I,Harry Brown,acting as a Education administrator for Penni Homans, MD.,have documented all relevant documentation on the behalf of Penni Homans, MD,as directed by  Penni Homans, MD while in the presence of Penni Homans, MD.   I, [name], MD. , personally preformed the services described in this documentation.  All medical record entries made by the scribe were at my direction and in my presence.  I have reviewed the chart and discharge instructions (if applicable) and agree that the record reflects my personal performance and is accurate and complete. 04/13/2021

## 2021-04-14 NOTE — Assessment & Plan Note (Signed)
Tolerating statin, encouraged heart healthy diet, avoid trans fats, minimize simple carbs and saturated fats. Increase exercise as tolerated 

## 2021-04-14 NOTE — Assessment & Plan Note (Signed)
Rate controlled and tolerating meds. Follows with cardiology

## 2021-04-14 NOTE — Assessment & Plan Note (Signed)
On Levothyroxine, continue to monitor 

## 2021-04-14 NOTE — Assessment & Plan Note (Signed)
hgba1c acceptable, minimize simple carbs. Increase exercise as tolerated. Continue current meds 

## 2021-04-14 NOTE — Assessment & Plan Note (Signed)
Disequilibrium, weakness. Referred for physical therapy to try and help his ambulation and increase his strength and endurance.

## 2021-04-14 NOTE — Assessment & Plan Note (Signed)
Well controlled, no changes to meds. Encouraged heart healthy diet such as the DASH diet and exercise as tolerated.  °

## 2021-04-17 ENCOUNTER — Other Ambulatory Visit: Payer: Self-pay | Admitting: Family Medicine

## 2021-04-17 ENCOUNTER — Ambulatory Visit: Payer: Medicare Other | Admitting: Pharmacist

## 2021-04-17 DIAGNOSIS — I700 Atherosclerosis of aorta: Secondary | ICD-10-CM

## 2021-04-17 DIAGNOSIS — I4821 Permanent atrial fibrillation: Secondary | ICD-10-CM

## 2021-04-17 DIAGNOSIS — E1151 Type 2 diabetes mellitus with diabetic peripheral angiopathy without gangrene: Secondary | ICD-10-CM

## 2021-04-17 NOTE — Chronic Care Management (AMB) (Signed)
Chronic Care Management Pharmacy Note  04/17/2021 Name:  Harry Brown MRN:  [id number] DOB:  1940-09-10  Subjective: Harry Brown is an 80 y.o. year old male who is a primary patient of [name], MD.  The CCM team was consulted for assistance with disease management and care coordination needs.    Engaged with patient by telephone for follow up visit in response to provider referral for pharmacy case management and/or care coordination services.   Consent to Services:  The patient was given information about Chronic Care Management services, agreed to services, and gave verbal consent prior to initiation of services.  Please see initial visit note for detailed documentation.   Patient Care Team: [name], MD as PCP - General (Family Medicine) Conrad [place], MD as Consulting Physician (Vascular Surgery) Calvert Cantor, MD as Consulting Physician (Ophthalmology) Malachy Chamber, DDS as Consulting Physician (Periodontics) Cherre Robins, RPH-CPP (Pharmacist)  Recent office visits: 04/13/2021 - Fam Med (Dr Charlett Blake) F/U chronic conditions. Referred to physcial therapy for unsteady gait. Labs ordered but not drawn. No med changes. Received annual flu vaccine.   Recent consult visits: 10/01/2020 - Cardio (Dr Stanford Breed) - f/u a fib, CAD and AAA. CABG 12/2019. No med change; CTA planned for June 2022 for thoracic aortic aneurysm.    Hospital visits: No hospitalizations in last 6 months    Objective:  Lab Results  Component Value Date   CREATININE 1.08 11/07/2020   CREATININE 1.00 09/01/2020   CREATININE 0.88 06/18/2020    Lab Results  Component Value Date   HGBA1C 6.7 (H) 09/01/2020   Last diabetic Eye exam:  Lab Results  Component Value Date/Time   HMDIABEYEEXA No Retinopathy 05/26/2018 12:00 AM    Last diabetic Foot exam: No results found for: HMDIABFOOTEX      Component Value Date/Time   CHOL 121 09/01/2020 1057   CHOL 122 09/08/2018 0904   TRIG  40.0 09/01/2020 1057   HDL 40.40 09/01/2020 1057   HDL 45 09/08/2018 0904   CHOLHDL 3 09/01/2020 1057   VLDL 8.0 09/01/2020 1057   LDLCALC 73 09/01/2020 1057   LDLCALC 67 04/03/2020 1044    Hepatic Function Latest Ref Rng & Units 09/01/2020 06/18/2020 06/10/2020  Total Protein 6.0 - 8.3 g/dL 6.5 6.7 6.6  Albumin 3.5 - 5.2 g/dL 4.1 3.9 3.4(L)  AST 0 - 37 U/L _0 ALT 0 - 53 U/L _1 Alk Phosphatase 39 - 117 U/L 66 88 58  Total Bilirubin 0.2 - 1.2 mg/dL 0.7 0.5 1.3(H)  Bilirubin, Direct 0.0 - 0.2 mg/dL - - -    Lab Results  Component Value Date/Time   TSH 4.47 09/01/2020 10:57 AM   TSH 3.70 04/03/2020 10:44 AM   FREET4 0.81 05/02/2018 10:53 AM   FREET4 1.08 12/16/2011 03:25 PM    CBC Latest Ref Rng & Units 09/01/2020 06/18/2020 06/13/2020  WBC 4.0 - 10.5 K/uL 7.2 11.9(H) 11.0(H)  Hemoglobin 13.0 - 17.0 g/dL 11.8(L) 11.6(L) 10.7(L)  Hematocrit 39.0 - 52.0 % 36.2(L) 36.0(L) 31.4(L)  Platelets 150.0 - 400.0 K/uL 190.0 272.0 218    No results found for: VD25OH  Clinical ASCVD: Yes  The ASCVD Risk score (Arnett DK, et al., 2019) failed to calculate for the following reasons:   The 2019 ASCVD risk score is only valid for ages 50 to 46     Social History   Tobacco Use  Smoking Status Former   Packs/day: 1.50  Years: 50.00   Pack years: 75.00   Types: Cigarettes  Smokeless Tobacco Never  Tobacco Comments   05/14/2013 "quit smoking in ~ 2004; still Uses Comit lozenges"   BP Readings from Last 3 Encounters:  04/13/21 116/64  10/01/20 124/76  09/05/20 130/80   Pulse Readings from Last 3 Encounters:  04/13/21 (!) 49  10/01/20 77  09/05/20 88   Wt Readings from Last 3 Encounters:  04/13/21 196 lb 3.2 oz (89 kg)  03/25/21 199 lb (90.3 kg)  10/01/20 199 lb 1.9 oz (90.3 kg)    Assessment: Review of patient past medical history, allergies, medications, health status, including review of consultants reports, laboratory and other test data, was performed as part of  comprehensive evaluation and provision of chronic care management services.   SDOH:  (Social Determinants of Health) assessments and interventions performed:      CCM Care Plan  Allergies  Allergen Reactions   Adhesive [Tape] Rash   Latex Rash   Other Hives, Swelling and Rash    Plastic shopping bags Metal staples: rash, swelling    Medications Reviewed Today     Reviewed by Cherre Robins, RPH-CPP (Pharmacist) on 04/17/21 at [place] List Status: <None>   Medication Order Taking? Sig Documenting Provider Last Dose Status Informant  atenolol (TENORMIN) 25 MG tablet [id number] No TAKE ONE (1) TABLET BY MOUTH EVERY DAY [name], MD Taking Active   atorvastatin (LIPITOR) 40 MG tablet [id number] No TAKE ONE (1) TABLET BY MOUTH EACH DAY AT6PM [name], MD Taking Active   Cholecalciferol (VITAMIN D) 50 MCG (2000 UT) tablet [id number] No Take 2,000 Units by mouth daily.  [provider] Taking Active Self  ELIQUIS 5 MG TABS tablet [id number]  TAKE ONE (1) TABLET BY MOUTH TWO (2) TIMES DAILY [name], MD  Active   latanoprost (XALATAN) 0.005 % ophthalmic solution [id number] No Place 1 drop into both eyes at bedtime.  [provider] Taking Active Self           Med Note Alric Ran Feb 26, 2019  3:04 PM)    levothyroxine (SYNTHROID) 75 MCG tablet [id number] No TAKE ONE (1) TABLET BY MOUTH EVERY DAY [name], MD Taking Active   loratadine (CLARITIN) 10 MG tablet [id number] No Take 10 mg by mouth daily as needed for allergies. [provider] Taking Active Self  losartan (COZAAR) 25 MG tablet [id number] No TAKE ONE (1) TABLET BY MOUTH EVERY DAY Crenshaw, Denice Bors, MD Taking Active   metFORMIN (GLUCOPHAGE-XR) 500 MG 24 hr tablet [id number] No TAKE TWO (2) TABLETS BY MOUTH EVERY MORNING WITH BREAKFAST [name], MD Taking Active   nitroGLYCERIN (NITROSTAT) 0.4 MG SL tablet [id number] No Place 1 tablet (0.4 mg total) under the tongue  every 5 (five) minutes as needed for chest pain. Jenean Lindau, MD Taking Expired 02/06/20 2359 Self  Jonetta Speak LANCETS 82N MISC [id number] No USE TO CHECK BLOOD SUGAR ONCE DAILY [name], MD Taking Active Self  [facility] ULTRA test strip [id number] No USE 1 STRIP DAILY [name], MD Taking Active   tamsulosin (FLOMAX) 0.4 MG CAPS capsule [id number] No TAKE ONE CAPSULE BY MOUTH DAILY [name], MD Taking Active             Patient Active Problem List   Diagnosis Date Noted   Unsteady gait when walking 09/01/2020   Allergies 09/01/2020   SOM (secretory otitis  media), bilateral 09/01/2020   Encounter for therapeutic drug monitoring 07/01/2020   Paresthesia of right lower extremity 04/03/2020   Urinary frequency 11/19/2019   Atrial fibrillation ([place]) 11/19/2019   Bilateral shoulder pain 10/31/2018   Educated about COVID-19 virus infection 10/31/2018   Tremor of right hand 05/02/2018   Sleep apnea 09/16/2017   Nocturia 03/15/2017   Hypertension    Preventative health care 03/13/2016   Glaucoma 08/18/2014   Need for viral immunization 09/24/2013   Anemia 09/24/2013   Lower back pain 05/14/2013   Ambulatory dysfunction 05/14/2013   Benign prostatic hyperplasia 05/14/2013   Medicare annual wellness visit, subsequent 04/13/2013   Abdominal aortic aneurysm 04/03/2013   Atherosclerosis of native arteries of extremity with intermittent claudication ([place]) 02/23/2013   Onychomycosis 11/27/2012   PVD (peripheral vascular disease) ([place]) 11/27/2012   Neck pain on left side 11/22/2012   Peripheral neuropathy 03/31/2012   Bruit 12/29/2011   Hypothyroidism 12/16/2011   Type 2 diabetes mellitus with atherosclerosis of aorta ([place]) 12/16/2011   Hyperlipidemia 12/16/2011   Diverticulosis 12/16/2011    Immunization History  Administered Date(s) Administered   Fluad Quad(high Dose 65+) 04/20/2019, 04/03/2020, 04/13/2021   Influenza Split 03/31/2012   Influenza, High  Dose Seasonal PF 03/20/2013, 02/24/2015, 03/11/2016, 03/15/2017, 05/02/2018   Influenza,inj,Quad PF,6+ Mos 01/29/2014   Influenza,trivalent, recombinat, inj, PF 04/06/2011   PFIZER(Purple Top)SARS-COV-2 Vaccination 06/27/2019, 07/18/2019, 03/14/2020   Pfizer Covid-19 Vaccine Bivalent Booster 62yr & up 04/13/2021   Pneumococcal Conjugate-13 03/20/2013   Pneumococcal Polysaccharide-23 10/11/2004, 02/24/2015   Td 10/12/2003, 06/07/2009    Conditions to be addressed/monitored: Atrial Fibrillation, CAD, HTN, HLD, DMII and BPH; unsteady gait; OSA; hypothyroidism; PVD; AAA  Care Plan : General Pharmacy (Adult)  Updates made by ECherre Robins RPH-CPP since 04/17/2021 12:00 AM     Problem: Chronic Disease Management support, education, and care coordination needs related to Hypertension, Hyperlipidemia/Atherosclerosis/PVD, Diabetes, Afib, Hypothyroidism, Allergic Rhinitis, BPH, Glaucoma   Priority: High  Onset Date: 10/07/2020  Note:   Current Barriers:  Unable to independently afford treatment regimen Unable to maintain control of LDL at goal of <70 Chronic Disease Management support, education, and care coordination needs related to Hypertension, Hyperlipidemia/Atherosclerosis/PVD, Diabetes, Afib, Hypothyroidism, Allergic Rhinitis, BPH, Glaucoma  Pharmacist Clinical Goal(s):  Over the next 180 days, patient will achieve control of hyperlipidemia as evidenced by LDL <70 maintain control of BP and diabetes as evidenced by BP <130/80 and A1c <7.0%  Apply for patient assistance for Eliquis 532m through collaboration with PharmD and provider.   Interventions: 1:1 collaboration with BlMosie LukesMD regarding development and update of comprehensive plan of care as evidenced by provider attestation and co-signature Inter-disciplinary care team collaboration (see longitudinal plan of care) Comprehensive medication review performed; medication list updated in electronic medical  record  Hypertension BP goal <130/80; Currently controlled Current regimen:  Atenolol 2572maily  Losartan 64m41mily Interventions: Reviewed BP goal Requested patient to check blood pressure once weekly and when symptomptic and record Continue current medication regimen Ensure daily salt intake < 2300 mg/day  Hyperlipidemia/Atherosclerosis/PVD Lab Results  Component Value Date/Time   LDLCALC 73 09/01/2020 10:57 AM   LDLCALC 67 04/03/2020 10:44 AM  LDL goal < 70; Last LDL was just a little above goal Denies/reports hypotensive/hypertensive symptoms Current regimen:  Atorvastatin 40mg68mly Interventions: Discussed diet and exercise Discussed LDL goal Will have LDL checked next week at appt with PCP Encouraged patient to be as active as possible - even if its walking in home  2 or 3 times per day for 5 to 10 minutes.  Maintain cholesterol medication regimen.   Diabetes Lab Results  Component Value Date/Time   HGBA1C 6.7 (H) 09/01/2020 10:57 AM   HGBA1C 6.5 (H) 04/03/2020 10:44 AM  A1c goal <7%; Currently at goal Current glucose readings: fasting glucose: 100 to 150 Denies/reports hypoglycemic/hyperglycemic symptoms Current regimen:  Metformin XR 52m - take 2 tablets = 10089meach morning Interventions: Discussed diet and exercise Reviewed home blood glucose readings and reviewed goals  Fasting blood glucose goal (before meals) = 80 to 130 Blood glucose goal after a meal = less than 180  Continue to check blood sugar once daily, document, and provide at future appointments Continue current regimen for diabetes  Artial fibrillation CHADS2VASc score:  6 Previous cardioversion failed Current regimen:  Eliquis 20m69mwice daily Atenolol 220m61mily  Interventions: Patient has completed patient assistance application. I completed provider portion and forwarded to PCP to review and sign.  Medication management Pharmacist Clinical Goal(s): Over the next 90 days, patient  will work with PharmD and providers to maintain optimal medication adherence Current pharmacy: [facility] Comprehensive medication review performed. Continue current medication management strategy Patient self care activities - Over the next 90 days, patient will: Focus on medication adherence by filling and taking medications appropriately Take medications as prescribed Report any questions or concerns to PharmD and/or provider(s)  Patient Goals/Self-Care Activities Over the next 180 days, patient will:  take medications as prescribed, check glucose daily, document, and provide at future appointments, check blood pressure once a week, document, and provide at future appointments, collaborate with provider on medication access solutions, and increase exercise as able  Follow Up Plan: Telephone follow up appointment with care management team member scheduled for:  2 weeks to f/u PAP ;       Medication Assistance:  Patient completed application for Eliquis medication assistance program. I have completed provider portion. Forwarded to PCP to review and sign.  [facility] [address] [id number]: [phone]: [phone] Up:  Patient agrees to Care Plan and Follow-up.  Plan: Telephone follow up appointment with care management team member scheduled for:  2 weeks. to check on medication assistance program for EliqVerner CholarmD Clinical Pharmacist [place]

## 2021-04-17 NOTE — Patient Instructions (Signed)
Visit Information  Hypertension BP Readings from Last 3 Encounters:  04/13/21 116/64  10/01/20 124/76  09/05/20 130/80   Pharmacist Clinical Goal(s): Over the next 90 days, patient will work with PharmD and providers to maintain BP goal <130/80 Current regimen:  Atenolol 25mg  daily  Losartan 25mg  daily Interventions: Reviewed goal Requested patient to check blood pressure once weekly and when symptomptic and record Patient self care activities - Over the next 90 days, patient will: Check blood pressure once per week, document, and provide at future appointments Ensure daily salt intake < 2300 mg/day  Hyperlipidemia/Atherosclerosis/PVD Lab Results  Component Value Date/Time   LDLCALC 73 09/01/2020 10:57 AM   LDLCALC 67 04/03/2020 10:44 AM   Pharmacist Clinical Goal(s): Over the next 90 days, patient will work with PharmD and providers to achieve LDL goal < 70 Current regimen:  Atorvastatin 40mg  daily Interventions: Discussed diet and exercise Discussed LDL goal Patient self care activities - Over the next 90 days, patient will: Encourage you to be as active as possible - even if its walking in home 2 or 3 times per day for 5 to 10 minutes.  Maintain cholesterol medication regimen.   Diabetes Lab Results  Component Value Date/Time   HGBA1C 6.7 (H) 09/01/2020 10:57 AM   HGBA1C 6.5 (H) 04/03/2020 10:44 AM   Pharmacist Clinical Goal(s): Over the next 90 days, patient will work with PharmD and providers to maintain A1c goal <7% Current regimen:  Metformin XR 500mg  - take 2 tablets = 1000mg  each morning Interventions: Discussed diet and exercise Reviewed home blood glucose readings and reviewed goals  Fasting blood glucose goal (before meals) = 80 to 130 Blood glucose goal after a meal = less than 180  Patient self care activities - Over the next 90 days, patient will: Check blood sugar once daily, document, and provide at future appointments Contact provider with any  episodes of hypoglycemia  Artial fibrillation Pharmacist Clinical Goal(s) Over the next 90 days, patient will work with PharmD and providers to reduce risk of stroke associated with Atrial fibrillaiton and reduce barrier to obtaining medication Current regimen:  Eliquis 5mg  twice daily Atenolol 25mg  daily  Interventions: Patient returned application and completed provider portion Patient self care activities - Over the next 90 days, patient will: Continue Eliquis and atenolol.  We will follow up on patient assistance in 2 weeks.   Medication management Pharmacist Clinical Goal(s): Over the next 90 days, patient will work with PharmD and providers to maintain optimal medication adherence Current pharmacy: [facility] Comprehensive medication review performed. Continue current medication management strategy Patient self care activities - Over the next 90 days, patient will: Focus on medication adherence by filling and taking medications appropriately Take medications as prescribed Report any questions or concerns to PharmD and/or provider(s)  Copy of Chronic Care Management plan declined  Telephone follow up appointment with care management team member scheduled for: 2 weeks  Cherre Robins, PharmD Clinical Pharmacist [facility]

## 2021-04-28 ENCOUNTER — Other Ambulatory Visit: Payer: Self-pay | Admitting: Family Medicine

## 2021-04-29 ENCOUNTER — Telehealth: Payer: Medicare Other

## 2021-05-04 ENCOUNTER — Other Ambulatory Visit (HOSPITAL_BASED_OUTPATIENT_CLINIC_OR_DEPARTMENT_OTHER): Payer: Self-pay

## 2021-05-04 MED ORDER — PFIZER COVID-19 VAC BIVALENT 30 MCG/0.3ML IM SUSP
INTRAMUSCULAR | 0 refills | Status: DC
Start: 2021-04-13 — End: 2021-05-27
  Filled 2021-05-04: qty 0.3, 1d supply, fill #0

## 2021-05-06 ENCOUNTER — Ambulatory Visit: Payer: Medicare Other | Admitting: Physical Therapy

## 2021-05-06 DIAGNOSIS — I700 Atherosclerosis of aorta: Secondary | ICD-10-CM

## 2021-05-06 DIAGNOSIS — E1151 Type 2 diabetes mellitus with diabetic peripheral angiopathy without gangrene: Secondary | ICD-10-CM

## 2021-05-06 DIAGNOSIS — E782 Mixed hyperlipidemia: Secondary | ICD-10-CM | POA: Diagnosis not present

## 2021-05-06 DIAGNOSIS — I4821 Permanent atrial fibrillation: Secondary | ICD-10-CM | POA: Diagnosis not present

## 2021-05-07 ENCOUNTER — Telehealth: Payer: Medicare Other

## 2021-05-08 ENCOUNTER — Other Ambulatory Visit: Payer: Self-pay | Admitting: Family Medicine

## 2021-05-08 ENCOUNTER — Ambulatory Visit: Payer: Medicare Other

## 2021-05-08 DIAGNOSIS — Z961 Presence of intraocular lens: Secondary | ICD-10-CM | POA: Diagnosis not present

## 2021-05-08 DIAGNOSIS — H04123 Dry eye syndrome of bilateral lacrimal glands: Secondary | ICD-10-CM | POA: Diagnosis not present

## 2021-05-08 DIAGNOSIS — H401231 Low-tension glaucoma, bilateral, mild stage: Secondary | ICD-10-CM | POA: Diagnosis not present

## 2021-05-12 ENCOUNTER — Encounter: Payer: Medicare Other | Admitting: Physical Therapy

## 2021-05-13 ENCOUNTER — Telehealth: Payer: Self-pay | Admitting: Pharmacist

## 2021-05-13 NOTE — Telephone Encounter (Signed)
Attempted to contact patient with several numbers but was unsuccessful. Tried his cell number. His wife's home and cell number. Also called his pharmacy and verified, they has same contact number as we did for patient.  Need to request income documents for Eliquis patient assistance application because the program was unable to verify his income through usual avenues. Will send request to patient through [place]

## 2021-05-19 ENCOUNTER — Encounter: Payer: Medicare Other | Admitting: Physical Therapy

## 2021-05-21 ENCOUNTER — Encounter: Payer: Medicare Other | Admitting: Physical Therapy

## 2021-05-22 ENCOUNTER — Other Ambulatory Visit: Payer: Self-pay

## 2021-05-22 ENCOUNTER — Ambulatory Visit (HOSPITAL_COMMUNITY)
Admission: RE | Admit: 2021-05-22 | Discharge: 2021-05-22 | Disposition: A | Discharge status: Home / Self Care | Payer: Medicare Other | Source: Ambulatory Visit | Attending: Cardiovascular Disease | Admitting: Cardiovascular Disease

## 2021-05-22 ENCOUNTER — Other Ambulatory Visit (HOSPITAL_COMMUNITY): Payer: Self-pay | Admitting: Cardiology

## 2021-05-22 DIAGNOSIS — I7143 Infrarenal abdominal aortic aneurysm, without rupture: Secondary | ICD-10-CM | POA: Diagnosis not present

## 2021-05-22 DIAGNOSIS — I714 Abdominal aortic aneurysm, without rupture, unspecified: Secondary | ICD-10-CM

## 2021-05-25 ENCOUNTER — Encounter: Payer: Medicare Other | Admitting: Physical Therapy

## 2021-05-27 ENCOUNTER — Ambulatory Visit (INDEPENDENT_AMBULATORY_CARE_PROVIDER_SITE_OTHER): Payer: Medicare Other | Admitting: Pharmacist

## 2021-05-27 DIAGNOSIS — E782 Mixed hyperlipidemia: Secondary | ICD-10-CM

## 2021-05-27 DIAGNOSIS — I700 Atherosclerosis of aorta: Secondary | ICD-10-CM

## 2021-05-27 DIAGNOSIS — I4821 Permanent atrial fibrillation: Secondary | ICD-10-CM

## 2021-05-27 NOTE — Chronic Care Management (AMB) (Signed)
Chronic Care Management Pharmacy Note  05/27/2021 Name:  Harry Brown MRN:  [id number] DOB:  18-Nov-1940  Summary:  Patient approved 05/28/2021 thru 06/06/2021 for [employer] patient assistance program to get Eliquis. Will only get 1 shipment but this might help him avoid / delay entry into coverage gap in 2023.   Subjective: Harry Brown is an 80 y.o. year old male who is a primary patient of [name], MD.  The CCM team was consulted for assistance with disease management and care coordination needs.    Engaged with patient by telephone for follow up visit in response to provider referral for pharmacy case management and/or care coordination services.   Consent to Services:  The patient was given information about Chronic Care Management services, agreed to services, and gave verbal consent prior to initiation of services.  Please see initial visit note for detailed documentation.   Patient Care Team: [name], MD as PCP - General (Family Medicine) Conrad , MD as Consulting Physician (Vascular Surgery) Calvert Cantor, MD as Consulting Physician (Ophthalmology) Malachy Chamber, DDS as Consulting Physician (Periodontics) Cherre Robins, RPH-CPP (Pharmacist)  Recent office visits: 04/13/2021 - Fam Med (Dr Charlett Blake) F/U chronic conditions. Referred to physcial therapy for unsteady gait. Labs ordered but not drawn. No med changes. Received annual flu vaccine.   Recent consult visits: 10/01/2020 - Cardio (Dr Stanford Breed) - f/u a fib, CAD and AAA. CABG 12/2019. No med change; CTA planned for June 2022 for thoracic aortic aneurysm.    Hospital visits: No hospitalizations in last 6 months    Objective:  Lab Results  Component Value Date   CREATININE 1.08 11/07/2020   CREATININE 1.00 09/01/2020   CREATININE 0.88 06/18/2020    Lab Results  Component Value Date   HGBA1C 6.7 (H) 09/01/2020   Last diabetic Eye exam:  Lab Results  Component Value Date/Time    HMDIABEYEEXA No Retinopathy 05/26/2018 12:00 AM    Last diabetic Foot exam: No results found for: HMDIABFOOTEX      Component Value Date/Time   CHOL 121 09/01/2020 1057   CHOL 122 09/08/2018 0904   TRIG 40.0 09/01/2020 1057   HDL 40.40 09/01/2020 1057   HDL 45 09/08/2018 0904   CHOLHDL 3 09/01/2020 1057   VLDL 8.0 09/01/2020 1057   LDLCALC 73 09/01/2020 1057   LDLCALC 67 04/03/2020 1044    Hepatic Function Latest Ref Rng & Units 09/01/2020 06/18/2020 06/10/2020  Total Protein 6.0 - 8.3 g/dL 6.5 6.7 6.6  Albumin 3.5 - 5.2 g/dL 4.1 3.9 3.4(L)  AST 0 - 37 U/L 14 27 20   ALT 0 - 53 U/L 15 11 20   Alk Phosphatase 39 - 117 U/L 66 88 58  Total Bilirubin 0.2 - 1.2 mg/dL 0.7 0.5 1.3(H)  Bilirubin, Direct 0.0 - 0.2 mg/dL - - -    Lab Results  Component Value Date/Time   TSH 4.47 09/01/2020 10:57 AM   TSH 3.70 04/03/2020 10:44 AM   FREET4 0.81 05/02/2018 10:53 AM   FREET4 1.08 12/16/2011 03:25 PM    CBC Latest Ref Rng & Units 09/01/2020 06/18/2020 06/13/2020  WBC 4.0 - 10.5 K/uL 7.2 11.9(H) 11.0(H)  Hemoglobin 13.0 - 17.0 g/dL 11.8(L) 11.6(L) 10.7(L)  Hematocrit 39.0 - 52.0 % 36.2(L) 36.0(L) 31.4(L)  Platelets 150.0 - 400.0 K/uL 190.0 272.0 218    No results found for: VD25OH  Clinical ASCVD: Yes  The ASCVD Risk score (Arnett DK, et al., 2019) failed to calculate for the  following reasons:   The 2019 ASCVD risk score is only valid for ages 37 to 68     Social History   Tobacco Use  Smoking Status Former   Packs/day: 1.50   Years: 50.00   Pack years: 75.00   Types: Cigarettes  Smokeless Tobacco Never  Tobacco Comments   05/14/2013 "quit smoking in ~ 2004; still Uses Comit lozenges"   BP Readings from Last 3 Encounters:  04/13/21 116/64  10/01/20 124/76  09/05/20 130/80   Pulse Readings from Last 3 Encounters:  04/13/21 (!) 49  10/01/20 77  09/05/20 88   Wt Readings from Last 3 Encounters:  04/13/21 196 lb 3.2 oz (89 kg)  03/25/21 199 lb (90.3 kg)  10/01/20 199 lb  1.9 oz (90.3 kg)    Assessment: Review of patient past medical history, allergies, medications, health status, including review of consultants reports, laboratory and other test data, was performed as part of comprehensive evaluation and provision of chronic care management services.   SDOH:  (Social Determinants of Health) assessments and interventions performed:      CCM Care Plan  Allergies  Allergen Reactions   Adhesive [Tape] Rash   Latex Rash   Other Hives, Swelling and Rash    Plastic shopping bags Metal staples: rash, swelling    Medications Reviewed Today     Reviewed by Cherre Robins, RPH-CPP (Pharmacist) on 05/27/21 at [facility] List Status: <None>   Medication Order Taking? Sig Documenting Provider Last Dose Status Informant  atenolol (TENORMIN) 25 MG tablet [id number] Yes TAKE ONE (1) TABLET BY MOUTH EVERY DAY [name], MD Taking Active   atorvastatin (LIPITOR) 40 MG tablet [id number] Yes TAKE ONE (1) TABLET BY MOUTH EACH DAY AT6PM [name], MD Taking Active   Cholecalciferol (VITAMIN D) 50 MCG (2000 UT) tablet [id number] Yes Take 2,000 Units by mouth daily.  [provider] Taking Active Self  ELIQUIS 5 MG TABS tablet [id number] Yes TAKE ONE (1) TABLET BY MOUTH TWO (2) TIMES DAILY [name], MD Taking Active   latanoprost (XALATAN) 0.005 % ophthalmic solution [id number] Yes Place 1 drop into both eyes at bedtime.  [provider] Taking Active Self           Med Note Alric Ran Feb 26, 2019  3:04 PM)    levothyroxine (SYNTHROID) 75 MCG tablet [id number] Yes TAKE ONE (1) TABLET BY MOUTH EVERY DAY [name], MD Taking Active   loratadine (CLARITIN) 10 MG tablet [id number] Yes Take 10 mg by mouth daily as needed for allergies. [provider] Taking Active Self  losartan (COZAAR) 25 MG tablet [id number] Yes TAKE ONE (1) TABLET BY MOUTH EVERY DAY Crenshaw, Denice Bors, MD Taking Active   metFORMIN (GLUCOPHAGE-XR) 500 MG  24 hr tablet [id number] Yes TAKE TWO (2) TABLETS BY MOUTH EVERY MORNING WITH BREAKFAST [name], MD Taking Active   nitroGLYCERIN (NITROSTAT) 0.4 MG SL tablet [id number]  Place 1 tablet (0.4 mg total) under the tongue every 5 (five) minutes as needed for chest pain. Jenean Lindau, MD  Expired 02/06/20 2359 Self  Jonetta Speak LANCETS 31D MISC [id number] Yes USE TO CHECK BLOOD SUGAR ONCE DAILY [name], MD Taking Active Self  Donald Siva test strip [id number] Yes USE 1 STRIP DAILY [name], MD Taking Active   tamsulosin (FLOMAX) 0.4 MG CAPS capsule [id number] Yes TAKE ONE CAPSULE BY MOUTH DAILY [name], MD Taking Active  Patient Active Problem List   Diagnosis Date Noted   Unsteady gait when walking 09/01/2020   Allergies 09/01/2020   SOM (secretory otitis media), bilateral 09/01/2020   Encounter for therapeutic drug monitoring 07/01/2020   Paresthesia of right lower extremity 04/03/2020   Urinary frequency 11/19/2019   Atrial fibrillation ([place]) 11/19/2019   Bilateral shoulder pain 10/31/2018   Educated about COVID-19 virus infection 10/31/2018   Tremor of right hand 05/02/2018   Sleep apnea 09/16/2017   Nocturia 03/15/2017   Hypertension    Preventative health care 03/13/2016   Glaucoma 08/18/2014   Need for viral immunization 09/24/2013   Anemia 09/24/2013   Lower back pain 05/14/2013   Ambulatory dysfunction 05/14/2013   Benign prostatic hyperplasia 05/14/2013   Medicare annual wellness visit, subsequent 04/13/2013   Abdominal aortic aneurysm 04/03/2013   Atherosclerosis of native arteries of extremity with intermittent claudication ([place]) 02/23/2013   Onychomycosis 11/27/2012   PVD (peripheral vascular disease) ([place]) 11/27/2012   Neck pain on left side 11/22/2012   Peripheral neuropathy 03/31/2012   Bruit 12/29/2011   Hypothyroidism 12/16/2011   Type 2 diabetes mellitus with atherosclerosis of aorta ([place]) 12/16/2011    Hyperlipidemia 12/16/2011   Diverticulosis 12/16/2011    Immunization History  Administered Date(s) Administered   Fluad Quad(high Dose 65+) 04/20/2019, 04/03/2020, 04/13/2021   Influenza Split 03/31/2012   Influenza, High Dose Seasonal PF 03/20/2013, 02/24/2015, 03/11/2016, 03/15/2017, 05/02/2018   Influenza,inj,Quad PF,6+ Mos 01/29/2014   Influenza,trivalent, recombinat, inj, PF 04/06/2011   PFIZER(Purple Top)SARS-COV-2 Vaccination 06/27/2019, 07/18/2019, 03/14/2020   Pfizer Covid-19 Vaccine Bivalent Booster 71yr & up 04/13/2021   Pneumococcal Conjugate-13 03/20/2013   Pneumococcal Polysaccharide-23 10/11/2004, 02/24/2015   Td 10/12/2003, 06/07/2009    Conditions to be addressed/monitored: Atrial Fibrillation, CAD, HTN, HLD, DMII and BPH; unsteady gait; OSA; hypothyroidism; PVD; AAA  Care Plan : General Pharmacy (Adult)  Updates made by ECherre Robins RPH-CPP since 05/27/2021 12:00 AM     Problem: Chronic Disease Management support, education, and care coordination needs related to Hypertension, Hyperlipidemia/Atherosclerosis/PVD, Diabetes, Afib, Hypothyroidism, Allergic Rhinitis, BPH, Glaucoma   Priority: High  Onset Date: 10/07/2020  Note:   Current Barriers:  Unable to independently afford treatment regimen Unable to maintain control of LDL at goal of <70 Chronic Disease Management support, education, and care coordination needs related to Hypertension, Hyperlipidemia/Atherosclerosis/PVD, Diabetes, Afib, Hypothyroidism, Allergic Rhinitis, BPH, Glaucoma  Pharmacist Clinical Goal(s):  Over the next 180 days, patient will achieve control of hyperlipidemia as evidenced by LDL <70 maintain control of BP and diabetes as evidenced by BP <130/80 and A1c <7.0%  Apply for patient assistance for Eliquis 520m through collaboration with PharmD and provider.   Interventions: 1:1 collaboration with BlMosie LukesMD regarding development and update of comprehensive plan of care as  evidenced by provider attestation and co-signature Inter-disciplinary care team collaboration (see longitudinal plan of care) Comprehensive medication review performed; medication list updated in electronic medical record  Hypertension BP goal <130/80; Currently controlled Current regimen:  Atenolol 2531maily  Losartan 46m22mily Interventions: Reviewed blood pressure goal Requested patient to check blood pressure once weekly and when symptomptic and record Continue current medication regimen Ensure daily salt intake < 2300 mg/day  Hyperlipidemia/Atherosclerosis/PVD Lab Results  Component Value Date   CHOL 121 09/01/2020   CHOL 117 04/03/2020   CHOL 116 11/15/2019   Lab Results  Component Value Date   HDL 40.40 09/01/2020   HDL 37 (L) 04/03/2020   HDL 40.60 11/15/2019   Lab Results  Component Value Date   LDLCALC 73 09/01/2020   LDLCALC 67 04/03/2020   LDLCALC 67 11/15/2019   Lab Results  Component Value Date   TRIG 40.0 09/01/2020   TRIG 53 04/03/2020   TRIG 46.0 11/15/2019  LDL goal < 70; Last LDL was just a little above goal Denies/reports hypotensive/hypertensive symptoms Current regimen:  Atorvastatin 75m daily Interventions: Discussed diet and exercise Discussed LDL goal Will have LDL checked next week at appt with PCP Encouraged patient to be as active as possible - even if its walking in home 2 or 3 times per day for 5 to 10 minutes.  Maintain cholesterol medication regimen.   Diabetes Lab Results  Component Value Date/Time   HGBA1C 6.7 (H) 09/01/2020 10:57 AM   HGBA1C 6.5 (H) 04/03/2020 10:44 AM  A1c goal <7%; Currently at goal Current glucose readings: fasting glucose: 100 to 150 Denies/reports hypoglycemic/hyperglycemic symptoms Current regimen:  Metformin XR 5066m- take 2 tablets = 100040mach morning Interventions: Discussed diet and exercise Reviewed home blood glucose readings and reviewed goals  Fasting blood glucose goal (before  meals) = 80 to 130 Blood glucose goal after a meal = less than 180  Continue to check blood sugar once daily, document, and provide at future appointments Continue current regimen for diabetes  Artial fibrillation CHADS2VASc score:  6 Previous cardioversion failed Current regimen:  Eliquis 5mg68mice daily Atenolol 25mg87AJly  Application for Eliquis patient assistance program was faxed 04/22/2021. They needed additional financial information. Patient mailed to them.   Interventions: Contacted [employer] patient assistance program to check status of Eliquis patient assistance program application.  Patient provided number for [employer] to verify shipping address for Eliquis patient assistance program. Patient will receive 1 shipment (only approved thru 06/06/2021). Can reapply after he hits coverage gap in 2023.   Medication management Pharmacist Clinical Goal(s): Over the next 90 days, patient will work with PharmD and providers to maintain optimal medication adherence Current pharmacy: [facility] Comprehensive medication review performed. Continue current medication management strategy Patient self care activities - Over the next 90 days, patient will: Focus on medication adherence by filling and taking medications appropriately Take medications as prescribed Report any questions or concerns to PharmD and/or provider(s)  Patient Goals/Self-Care Activities Over the next 180 days, patient will:  take medications as prescribed, check glucose daily, document, and provide at future appointments, check blood pressure once a week, document, and provide at future appointments, collaborate with provider on medication access solutions, and increase exercise as able  Follow Up Plan: Telephone follow up appointment with care management team member scheduled for:  3 months       Medication Assistance:  Eliquis obtained through [place].   Enrollment ends 06/06/2021  Patient's [facility] is:  [facility] [id number]: [phone]: [phone] Up:  Patient agrees to Care Plan and Follow-up.  Plan: Telephone follow up appointment with care management team member scheduled for:  3 to 4 months  TammCherre RobinsarmD Clinical Pharmacist [facility]

## 2021-05-27 NOTE — Patient Instructions (Addendum)
Harry Brown It was a pleasure speaking with you today.  I have attached a summary of our visit today and information about your health goals.   Our next appointment is by telephone on September 24, 2021 at 9:00am  Please call the care guide team at [phone] if you need to cancel or reschedule your appointment.   If you have any questions or concerns, please feel free to contact me either at the phone number below or with a MyChart message.   Keep up the good work!  Cherre Robins, PharmD Clinical Pharmacist [facility] [phone] (direct line)  [phone] (main office number)   CARE PLAN ENTRY  Hypertension BP Readings from Last 3 Encounters:  04/13/21 116/64  10/01/20 124/76  09/05/20 130/80   Pharmacist Clinical Goal(s): Over the next 90 days, patient will work with PharmD and providers to maintain BP goal <130/80 Current regimen:  Atenolol 25mg  daily  Losartan 25mg  daily Interventions: Reviewed goal Requested patient to check blood pressure once weekly and when symptomptic and record Patient self care activities - Over the next 90 days, patient will: Check blood pressure once per week, document, and provide at future appointments Ensure daily salt intake < 2300 mg/day  Hyperlipidemia/Atherosclerosis/PVD Lab Results  Component Value Date/Time   LDLCALC 73 09/01/2020 10:57 AM   LDLCALC 67 04/03/2020 10:44 AM   Pharmacist Clinical Goal(s): Over the next 90 days, patient will work with PharmD and providers to achieve LDL goal < 70 Current regimen:  Atorvastatin 40mg  daily Interventions: Discussed diet and exercise Discussed LDL goal Patient self care activities - Over the next 90 days, patient will: Encourage you to be as active as possible - even if its walking in home 2 or 3 times per day for 5 to 10 minutes.  Maintain cholesterol medication regimen.   Diabetes Lab Results  Component Value Date/Time   HGBA1C 6.7 (H)  09/01/2020 10:57 AM   HGBA1C 6.5 (H) 04/03/2020 10:44 AM   Pharmacist Clinical Goal(s): Over the next 90 days, patient will work with PharmD and providers to maintain A1c goal <7% Current regimen:  Metformin XR 500mg  - take 2 tablets = 1000mg  each morning Interventions: Discussed diet and exercise Reviewed home blood glucose readings and reviewed goals  Fasting blood glucose goal (before meals) = 80 to 130 Blood glucose goal after a meal = less than 180  Patient self care activities - Over the next 90 days, patient will: Check blood sugar once daily, document, and provide at future appointments Contact provider with any episodes of hypoglycemia  Artial fibrillation Pharmacist Clinical Goal(s) Over the next 90 days, patient will work with PharmD and providers to reduce risk of stroke associated with Atrial fibrillaiton and reduce barrier to obtaining medication Current regimen:  Eliquis 5mg  twice daily Atenolol 25mg  daily  Interventions: Confirmed with [place] patient assistance program that patient was approved for Eliquis 05/26/2021 thru 06/06/2021 - he will only get 1 shipment but that will help delay time to coverage gap in 2023.  Patient self care activities - Over the next 90 days, patient will: Continue Eliquis and atenolol.  Please call the [place] patient assistance program at [phone] to confirm you shipping address for Eliquis.  Medication management Pharmacist Clinical Goal(s): Over the next 90 days, patient will work with PharmD and providers to maintain optimal medication adherence Current pharmacy: [facility] Comprehensive medication review performed. Continue current medication management strategy Patient self care activities -  Over the next 90 days, patient will: Focus on medication adherence by filling and taking medications appropriately Take medications as prescribed Report any questions or concerns to PharmD  and/or provider(s)  Patient verbalizes understanding of instructions provided today and agrees to view in [place].    Cherre Robins, PharmD Clinical Pharmacist [facility]

## 2021-05-28 ENCOUNTER — Encounter: Payer: Medicare Other | Admitting: Physical Therapy

## 2021-05-29 ENCOUNTER — Encounter: Payer: Self-pay | Admitting: *Deleted

## 2021-06-06 DIAGNOSIS — I700 Atherosclerosis of aorta: Secondary | ICD-10-CM

## 2021-06-06 DIAGNOSIS — I4821 Permanent atrial fibrillation: Secondary | ICD-10-CM

## 2021-06-06 DIAGNOSIS — E782 Mixed hyperlipidemia: Secondary | ICD-10-CM | POA: Diagnosis not present

## 2021-06-06 DIAGNOSIS — E1151 Type 2 diabetes mellitus with diabetic peripheral angiopathy without gangrene: Secondary | ICD-10-CM

## 2021-06-23 ENCOUNTER — Telehealth: Payer: Self-pay | Admitting: Family Medicine

## 2021-06-23 NOTE — Telephone Encounter (Signed)
Patient dropped off forms to be filled out by blyth Placed into bin up front  Patient would like to be called to pick it up [phone]

## 2021-06-25 NOTE — Telephone Encounter (Signed)
Patient would like an update on his paperwork. He would like a call back whenever possible.

## 2021-06-26 NOTE — Telephone Encounter (Signed)
Paperwork faxed °

## 2021-07-09 ENCOUNTER — Other Ambulatory Visit: Payer: Self-pay | Admitting: Cardiology

## 2021-07-10 NOTE — Telephone Encounter (Signed)
I received fax that patient's application for Eliquis application for [place] was missing information (patient and physician signature and dates).  Usually patient's with Medicare plans cannot apply for [employer] patient assistance program until they have reached Medicare coverage gap AND spent 3% out of pocket.   I did call [employer]MS to provide needed information for Dr Charlett Blake (needed NPI #) provider and verified patient will have to reach Medicare coverage gap AND spent 3% out of pocket.   BMS only had provider portion. When patient reaches coverage gap will send in signed patient portion with pharmacy report and prescription if needed.

## 2021-07-22 ENCOUNTER — Other Ambulatory Visit: Payer: Self-pay | Admitting: Family Medicine

## 2021-08-10 ENCOUNTER — Ambulatory Visit (INDEPENDENT_AMBULATORY_CARE_PROVIDER_SITE_OTHER): Payer: Medicare Other | Admitting: Medical

## 2021-08-10 ENCOUNTER — Telehealth: Payer: Self-pay | Admitting: Family Medicine

## 2021-08-10 VITALS — BP 116/62 | HR 80 | Temp 98.2°F | Resp 18 | Ht 72.0 in | Wt 195.2 lb

## 2021-08-10 DIAGNOSIS — S0003XA Contusion of scalp, initial encounter: Secondary | ICD-10-CM

## 2021-08-10 DIAGNOSIS — I2510 Atherosclerotic heart disease of native coronary artery without angina pectoris: Secondary | ICD-10-CM

## 2021-08-10 DIAGNOSIS — E785 Hyperlipidemia, unspecified: Secondary | ICD-10-CM | POA: Diagnosis not present

## 2021-08-10 DIAGNOSIS — R2681 Unsteadiness on feet: Secondary | ICD-10-CM | POA: Diagnosis not present

## 2021-08-10 DIAGNOSIS — Z9181 History of falling: Secondary | ICD-10-CM

## 2021-08-10 DIAGNOSIS — G629 Polyneuropathy, unspecified: Secondary | ICD-10-CM | POA: Diagnosis not present

## 2021-08-10 DIAGNOSIS — H814 Vertigo of central origin: Secondary | ICD-10-CM | POA: Diagnosis not present

## 2021-08-10 DIAGNOSIS — R42 Dizziness and giddiness: Secondary | ICD-10-CM | POA: Diagnosis not present

## 2021-08-10 NOTE — Telephone Encounter (Signed)
Pt's wife stated he has been dizzy and fallen 4-5 times within the last 6 weeks. They were transferred to triage to speak with a nurse.  ?

## 2021-08-10 NOTE — Patient Instructions (Addendum)
Recent falls with baseline chronic dizziness/lightheadedness for 6 weeks.  Known history of peripheral vascular disease, coronary artery disease and atrial fibrillation.  Most recent event described rolling  on your back as you fell and bumping the back of your head.  Though no loss of consciousness reported. ? ?Decided to go ahead and place CT of head order without contrast.  I think  we will get prior authorized by tomorrow and then will get you scheduled for that study. ? ?With dizziness and unstable gait decided to go ahead and place referral for gait assessment with physical therapy.  Also I want you to go ahead and get walker as this will help prevent falls.  Including requested training on how to use walker on that referral as well. ? ?Placed order for bilateral carotid Dopplers.  With your history of vessel disease this might be helpful to work-up your dizziness and vitals. ? ?Also decided to get labs as this might be contributing factor in the dizziness.  Ordered CBC, CMP, B12 and B1.  Note lower extremity neuropathy might play a role and unstable gait. ? ?If he were to have any fall with syncope recommend ED evaluation immediately. ? ?Follow-up in 2 to 3 weeks or sooner if needed. ? ?Also note your pulse is normal.  However with your history of atrial fibrillation if you have any significant dizzy episodes recommend checking your pulse to see if it exceeds 100.  Atrial fibrillation with uncontrolled pulse could lead to dizziness and fall. ?

## 2021-08-10 NOTE — Telephone Encounter (Signed)
Pt has been scheduled for same day appointment with Percell Miller. ?

## 2021-08-10 NOTE — Progress Notes (Signed)
Subjective:    Patient ID: Harry Brown, male    DOB: [birth year], 81 y.o.   MRN: [id number]  HPI  Pt in with recent falls. About 5 falls over past 6 weeks. He states he does wabble when he walks. 4 out 5 times fell backwards. One time fell climbing up steps. Another time fell climbing short ladder.   Last fell a weeks ago. He was digging in yard. When he pulled back lost balance. No loc. He rolled on back and then bumped his head. No loc. No nausea, no vomiting, no blurred vision. Pt quite driving.  Pt feels slight dizziness constantly for at least 2 months.  No vertigo described.   Pt using cane for 2 weeks.   High cholesterol and pvd.  No preceding chest pain or palpitations before fall.s   Review of Systems  Constitutional:  Negative for chills, fatigue and fever.  HENT:  Negative for dental problem and ear discharge.   Respiratory:  Negative for cough, choking, shortness of breath and wheezing.   Cardiovascular:  Negative for chest pain and palpitations.  Neurological:  Negative for dizziness, speech difficulty, weakness and light-headedness.  Hematological:  Negative for adenopathy. Does not bruise/bleed easily.  Psychiatric/Behavioral:  Negative for behavioral problems, confusion and decreased concentration.     Past Medical History:  Diagnosis Date   Anemia 09/24/2013   CAD (coronary artery disease)    Cellulitis 05/14/2013   RLE   Diverticulitis    ED (erectile dysfunction)    Glaucoma 08/18/2014   History of sleep apnea    had surgery to correct   Hypertension    Hypothyroidism    Medicare annual wellness visit, subsequent 04/13/2013   Myocardial infarction ([place]) 11/05/1985   Nocturia 03/15/2017   Other and unspecified hyperlipidemia    Penile lesion 02/24/2015   Peripheral neuropathy 03/31/2012   Peripheral vascular disease ([place])    Postoperative anemia due to acute blood loss 09/24/2013   Preventative health care 03/13/2016   Sleep apnea 09/16/2017   Type II  diabetes mellitus ([place])    fasting 100-120   Urinary urgency 11/22/2012     Social History   Socioeconomic History   Marital status: Married    Spouse name: Not on file   Number of children: 8    Years of education: Not on file   Highest education level: Not on file  Occupational History    Employer: [employer]  Tobacco Use   Smoking status: Former    Packs/day: 1.50    Years: 50.00    Pack years: 75.00    Types: Cigarettes   Smokeless tobacco: Never   Tobacco comments:    05/14/2013 "quit smoking in ~ 2004; still Uses Comit lozenges"  Vaping Use   Vaping Use: Never used  Substance and Sexual Activity   Alcohol use: No    Alcohol/week: 0.0 standard drinks   Drug use: No   Sexual activity: Yes    Comment: lives with wife, retiring end of next week, no dietary restrictions  Other Topics Concern   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 [employer]   Social Determinants of Radio broadcast assistant Strain: Medium Risk   Difficulty of Paying Living Expenses: Somewhat hard  Food Insecurity: No Food Insecurity   Worried About Charity fundraiser in the Last Year: Never true   Arboriculturist in the  Last Year: Never true  Transportation Needs: No Transportation Needs   Lack of Transportation (Medical): No   Lack of Transportation (Non-Medical): No  Physical Activity: Inactive   Days of Exercise per Week: 0 days   Minutes of Exercise per Session: 0 min  Stress: No Stress Concern Present   Feeling of Stress : Not at all  Social Connections: Socially Integrated   Frequency of Communication with Friends and Family: More than three times a week   Frequency of Social Gatherings with Friends and Family: More than three times a week   Attends Religious Services: More than 4 times per year   Active Member of Clubs or Organizations: Yes   Attends Music therapist: More than 4 times per year    Marital Status: Married  Human resources officer Violence: Not At Risk   Fear of Current or Ex-Partner: No   Emotionally Abused: No   Physically Abused: No   Sexually Abused: No    Past Surgical History:  Procedure Laterality Date   ABDOMINAL AORTAGRAM  Oct. 9, 2014   Dr. Bridgett Larsson   ABDOMINAL Maxcine Ham N/A 03/15/2013   Procedure: ABDOMINAL Maxcine Ham;  Surgeon: Conrad Montcalm, MD;  Location: [facility] CATH LAB;  Service: Cardiovascular;  Laterality: N/A;   ANTERIOR CERVICAL DECOMP/DISCECTOMY FUSION  ~ 2000   CARDIOVERSION N/A 12/03/2019   Procedure: CARDIOVERSION;  Surgeon: Acie Fredrickson, Wonda Cheng, MD;  Location: [facility] ENDOSCOPY;  Service: Cardiovascular;  Laterality: N/A;   CATARACT EXTRACTION Right    CORONARY ARTERY BYPASS GRAFT  2001   "CABG X3" (05/14/2013)   DENTAL SURGERY     "multiple teeth removed; trmimed top of mouth so dentures would fit" (05/14/2013)   EYE SURGERY     cataracts   FEMORAL-POPLITEAL BYPASS GRAFT Right 04/25/2013   Procedure: RIGHT FEMORAL TO ABOVE KNEE POPLITEAL BYPASS GRAFT WITH RIGHT [place]; ULTRASOUND GUIDED;  Surgeon: Conrad Calaveras, MD;  Location: [facility] OR;  Service: Vascular;  Laterality: Right;   GUM SURGERY  [phone]   "had bone scraped; had periodontal disease" (05/14/2013)   PALATE / UVULA BIOPSY / EXCISION  2003   Removed due to sleep apnea   TEE WITHOUT CARDIOVERSION N/A 06/13/2020   Procedure: TRANSESOPHAGEAL ECHOCARDIOGRAM (TEE);  Surgeon: Geralynn Rile, MD;  Location: [facility] ENDOSCOPY;  Service: Cardiovascular;  Laterality: N/A;   TONSILLECTOMY      Family History  Problem Relation Age of Onset   Hyperlipidemia Mother    Heart disease Mother    Diabetes Mother    Hypertension Mother    Heart disease Father    Asthma Father    Arthritis Father    Heart disease Sister    Heart disease Sister        s/p bypass x 2   Lupus Sister    Heart disease Brother        MI waiting on heart transplant when died   Heart disease Brother        massive MI    Cancer Neg Hx     Allergies  Allergen Reactions   Adhesive [Tape] Rash   Latex Rash   Other Hives, Swelling and Rash    Plastic shopping bags Metal staples: rash, swelling    Current Outpatient Medications on File Prior to Visit  Medication Sig Dispense Refill   atenolol (TENORMIN) 25 MG tablet TAKE ONE (1) TABLET BY MOUTH EVERY DAY 90 tablet 1   atorvastatin (LIPITOR) 40 MG tablet TAKE ONE (1) TABLET BY  MOUTH EACH DAY AT 6PM *DUE FOR APPOINTMENT AND LABS IN MARCH* 90 tablet 0   Cholecalciferol (VITAMIN D) 50 MCG (2000 UT) tablet Take 2,000 Units by mouth daily.      ELIQUIS 5 MG TABS tablet TAKE ONE (1) TABLET BY MOUTH TWO (2) TIMES DAILY 60 tablet 3   latanoprost (XALATAN) 0.005 % ophthalmic solution Place 1 drop into both eyes at bedtime.      levothyroxine (SYNTHROID) 75 MCG tablet TAKE ONE (1) TABLET BY MOUTH EVERY DAY 90 tablet 1   loratadine (CLARITIN) 10 MG tablet Take 10 mg by mouth daily as needed for allergies.     losartan (COZAAR) 25 MG tablet TAKE ONE (1) TABLET BY MOUTH EVERY DAY 30 tablet 5   metFORMIN (GLUCOPHAGE-XR) 500 MG 24 hr tablet TAKE TWO (2) TABLETS BY MOUTH EVERY MORNING WITH BREAKFAST *DUE FOR APPOINTMENT AND LABS IN MARCH* 180 tablet 0   ONETOUCH DELICA LANCETS 16X MISC USE TO CHECK BLOOD SUGAR ONCE DAILY 100 each 1   ONETOUCH ULTRA test strip USE 1 STRIP DAILY 100 each 12   tamsulosin (FLOMAX) 0.4 MG CAPS capsule TAKE ONE CAPSULE BY MOUTH DAILY 30 capsule 5   nitroGLYCERIN (NITROSTAT) 0.4 MG SL tablet Place 1 tablet (0.4 mg total) under the tongue every 5 (five) minutes as needed for chest pain. 25 tablet 11   No current facility-administered medications on file prior to visit.    BP 116/62    Pulse 80    Temp 98.2 F (36.8 C)    Resp 18    Ht 6' (1.829 m)    Wt 195 lb 3.2 oz (88.5 kg)    SpO2 98%    BMI 26.47 kg/m       Objective:   Physical Exam  General Mental Status- Alert. General Appearance- Not in acute distress.   Skin General: Color-  Normal Color. Moisture- Normal Moisture.  Neck Carotid Arteries- Normal color. Moisture- Normal Moisture. No carotid bruits. No JVD.  Chest and Lung Exam Auscultation: Breath Sounds:-Normal.  Cardiovascular Auscultation:Rythm- Regular. Murmurs & Other Heart Sounds:Auscultation of the heart reveals- No Murmurs.  Abdomen Inspection:-Inspeection Normal. Palpation/Percussion:Note:No mass. Palpation and Percussion of the abdomen reveal- Non Tender, Non Distended + BS, no rebound or guarding.    Neurologic Cranial Nerve exam:- CN III-XII intact(No nystagmus), symmetric smile. Drift Test:- No drift. Romberg Exam:- Negative.  Finger to Nose:- Normal/Intact Strength:- 5/5 equal and symmetric strength both upper and lower extremities.  Unstable gait. When walks starts to deviate toward wall of hall.      Assessment & Plan:   Patient Instructions  Recent falls with baseline chronic dizziness/lightheadedness for 6 weeks.  Known history of peripheral vascular disease, coronary artery disease and atrial fibrillation.  Most recent event described rolling  on your back as you fell and bumping the back of your head.  Though no loss of consciousness reported.  Decided to go ahead and place CT of head order without contrast.  I think  we will get prior authorized by tomorrow and then will get you scheduled for that study.  With dizziness and unstable gait decided to go ahead and place referral for gait assessment with physical therapy.  Also I want you to go ahead and get walker as this will help prevent falls.  Including requested training on how to use walker on that referral as well.  Placed order for bilateral carotid Dopplers.  With your history of vessel disease this might be helpful to  work-up your dizziness and vitals.  Also decided to get labs as this might be contributing factor in the dizziness.  Ordered CBC, CMP, B12 and B1.  Note lower extremity neuropathy might play a role and  unstable gait.  If he were to have any fall with syncope recommend ED evaluation immediately.  Follow-up in 2 to 3 weeks or sooner if needed.  Also note your pulse is normal.  However with your history of atrial fibrillation if you have any significant dizzy episodes recommend checking your pulse to see if it exceeds 100.  Atrial fibrillation with uncontrolled pulse could lead to dizziness and fall.   Mackie Pai, PA-C    Time spent with patient today was 43  minutes which consisted of chart revdiew, discussing diagnosis, work up treatment and documentation.

## 2021-08-11 ENCOUNTER — Ambulatory Visit (HOSPITAL_BASED_OUTPATIENT_CLINIC_OR_DEPARTMENT_OTHER)
Admission: RE | Admit: 2021-08-11 | Discharge: 2021-08-11 | Disposition: A | Discharge status: Home / Self Care | Payer: Medicare Other | Source: Ambulatory Visit | Attending: Medical | Admitting: Medical

## 2021-08-11 ENCOUNTER — Other Ambulatory Visit: Payer: Self-pay

## 2021-08-11 DIAGNOSIS — E785 Hyperlipidemia, unspecified: Secondary | ICD-10-CM | POA: Insufficient documentation

## 2021-08-11 DIAGNOSIS — R42 Dizziness and giddiness: Secondary | ICD-10-CM | POA: Insufficient documentation

## 2021-08-11 DIAGNOSIS — R2681 Unsteadiness on feet: Secondary | ICD-10-CM | POA: Insufficient documentation

## 2021-08-11 DIAGNOSIS — I2510 Atherosclerotic heart disease of native coronary artery without angina pectoris: Secondary | ICD-10-CM | POA: Insufficient documentation

## 2021-08-11 DIAGNOSIS — I10 Essential (primary) hypertension: Secondary | ICD-10-CM | POA: Diagnosis not present

## 2021-08-11 DIAGNOSIS — Z9181 History of falling: Secondary | ICD-10-CM | POA: Insufficient documentation

## 2021-08-11 DIAGNOSIS — E782 Mixed hyperlipidemia: Secondary | ICD-10-CM | POA: Diagnosis not present

## 2021-08-11 DIAGNOSIS — E119 Type 2 diabetes mellitus without complications: Secondary | ICD-10-CM | POA: Diagnosis not present

## 2021-08-11 DIAGNOSIS — I6523 Occlusion and stenosis of bilateral carotid arteries: Secondary | ICD-10-CM | POA: Diagnosis not present

## 2021-08-11 LAB — COMPREHENSIVE METABOLIC PANEL
ALT: 19 U/L (ref 0–53)
AST: 23 U/L (ref 0–37)
Albumin: 4.1 g/dL (ref 3.5–5.2)
Alkaline Phosphatase: 79 U/L (ref 39–117)
BUN: 25 mg/dL — ABNORMAL HIGH (ref 6–23)
CO2: 27 mEq/L (ref 19–32)
Calcium: 9.6 mg/dL (ref 8.4–10.5)
Chloride: 105 mEq/L (ref 96–112)
Creatinine, Ser: 1.11 mg/dL (ref 0.40–1.50)
GFR: 62.19 mL/min (ref >60.00)
Glucose, Bld: 98 mg/dL (ref 70–99)
Potassium: 4.9 mEq/L (ref 3.5–5.1)
Sodium: 138 mEq/L (ref 135–145)
Total Bilirubin: 0.6 mg/dL (ref 0.2–1.2)
Total Protein: 7.1 g/dL (ref 6.0–8.3)

## 2021-08-11 LAB — CBC WITH DIFFERENTIAL/PLATELET
Basophils Absolute: 0.1 10*3/uL (ref 0.0–0.1)
Basophils Relative: 0.7 % (ref 0.0–3.0)
Eosinophils Absolute: 0.2 10*3/uL (ref 0.0–0.7)
Eosinophils Relative: 2.7 % (ref 0.0–5.0)
HCT: 36.8 % — ABNORMAL LOW (ref 39.0–52.0)
Hemoglobin: 12.4 g/dL — ABNORMAL LOW (ref 13.0–17.0)
Lymphocytes Relative: 24.6 % (ref 12.0–46.0)
Lymphs Abs: 2.2 10*3/uL (ref 0.7–4.0)
MCHC: 33.6 g/dL (ref 30.0–36.0)
MCV: 92.7 fl (ref 78.0–100.0)
Monocytes Absolute: 0.8 10*3/uL (ref 0.1–1.0)
Monocytes Relative: 9.3 % (ref 3.0–12.0)
Neutro Abs: 5.5 10*3/uL (ref 1.4–7.7)
Neutrophils Relative %: 62.7 % (ref 43.0–77.0)
Platelets: 228 10*3/uL (ref 150.0–400.0)
RBC: 3.97 Mil/uL — ABNORMAL LOW (ref 4.22–5.81)
RDW: 15.2 % (ref 11.5–15.5)
WBC: 8.8 10*3/uL (ref 4.0–10.5)

## 2021-08-11 LAB — FOLATE: Folate: >24.2 ng/mL (ref >5.9)

## 2021-08-11 LAB — VITAMIN B12: Vitamin B-12: 360 pg/mL (ref 211–911)

## 2021-08-13 LAB — VITAMIN B1: Vitamin B1 (Thiamine): 23 nmol/L (ref 8–30)

## 2021-08-25 ENCOUNTER — Telehealth: Payer: Self-pay | Admitting: Medical

## 2021-08-25 ENCOUNTER — Ambulatory Visit (INDEPENDENT_AMBULATORY_CARE_PROVIDER_SITE_OTHER): Payer: Medicare Other | Admitting: Medical

## 2021-08-25 VITALS — BP 118/56 | HR 74 | Resp 18 | Ht 72.0 in | Wt 194.0 lb

## 2021-08-25 DIAGNOSIS — R42 Dizziness and giddiness: Secondary | ICD-10-CM

## 2021-08-25 DIAGNOSIS — R2681 Unsteadiness on feet: Secondary | ICD-10-CM | POA: Diagnosis not present

## 2021-08-25 DIAGNOSIS — I700 Atherosclerosis of aorta: Secondary | ICD-10-CM

## 2021-08-25 DIAGNOSIS — E782 Mixed hyperlipidemia: Secondary | ICD-10-CM

## 2021-08-25 DIAGNOSIS — E1151 Type 2 diabetes mellitus with diabetic peripheral angiopathy without gangrene: Secondary | ICD-10-CM | POA: Diagnosis not present

## 2021-08-25 DIAGNOSIS — I6381 Other cerebral infarction due to occlusion or stenosis of small artery: Secondary | ICD-10-CM | POA: Diagnosis not present

## 2021-08-25 NOTE — Telephone Encounter (Signed)
08-10-21 I order ct head. Has it been prior authorized?  ?

## 2021-08-25 NOTE — Progress Notes (Signed)
Subjective:    Patient ID: Harry Brown, male    DOB: 1940-10-23, 81 y.o.   MRN: [id number]  HPI  On recent falls since last visit. Pt has been using hurricane can and states helps a lot. [place] of neck was ok/no significant blockage. Ct scan not done yet? I sent message to referral staff asking about if auth had been done. PT referral placed and appointment on September 08, 2021.   States light headed sensation is less now.  Labs done were negative/no significant findings.  Pt checking sugars in morning and sugar close to 110 fasting.   Review of Systems  Constitutional:  Negative for chills, fatigue and fever.  HENT:  Negative for congestion, drooling, facial swelling and hearing loss.   Respiratory:  Negative for cough, chest tightness, shortness of breath and wheezing.   Cardiovascular:  Negative for chest pain and palpitations.  Gastrointestinal:  Negative for abdominal pain, blood in stool and diarrhea.  Genitourinary:  Negative for dysuria, flank pain, frequency, genital sores and hematuria.  Musculoskeletal:  Negative for back pain and joint swelling.  Skin:  Negative for rash.  Neurological:  Negative for dizziness, speech difficulty, weakness, numbness and headaches.  Hematological:  Negative for adenopathy. Does not bruise/bleed easily.  Psychiatric/Behavioral:  Negative for behavioral problems, dysphoric mood and suicidal ideas. The patient is not nervous/anxious.     Past Medical History:  Diagnosis Date   Anemia 09/24/2013   CAD (coronary artery disease)    Cellulitis 05/14/2013   RLE   Diverticulitis    ED (erectile dysfunction)    Glaucoma 08/18/2014   History of sleep apnea    had surgery to correct   Hypertension    Hypothyroidism    Medicare annual wellness visit, subsequent 04/13/2013   Myocardial infarction ([facility]) 11/05/1985   Nocturia 03/15/2017   Other and unspecified hyperlipidemia    Penile lesion 02/24/2015   Peripheral neuropathy 03/31/2012   Peripheral  vascular disease ([facility])    Postoperative anemia due to acute blood loss 09/24/2013   Preventative health care 03/13/2016   Sleep apnea 09/16/2017   Type II diabetes mellitus ([facility])    fasting 100-120   Urinary urgency 11/22/2012     Social History   Socioeconomic History   Marital status: Married    Spouse name: Not on file   Number of children: 8    Years of education: Not on file   Highest education level: Not on file  Occupational History    Employer: [employer]  Tobacco Use   Smoking status: Former    Packs/day: 1.50    Years: 50.00    Pack years: 75.00    Types: Cigarettes   Smokeless tobacco: Never   Tobacco comments:    05/14/2013 "quit smoking in ~ 2004; still Uses Comit lozenges"  Vaping Use   Vaping Use: Never used  Substance and Sexual Activity   Alcohol use: No    Alcohol/week: 0.0 standard drinks   Drug use: No   Sexual activity: Yes    Comment: lives with wife, retiring end of next week, no dietary restrictions  Other Topics Concern   Not on file  Social History Narrative   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, 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 [employer]   Social Determinants of Corporate investment banker Strain: Medium Risk   Difficulty of Paying Living Expenses: Somewhat hard  Food Insecurity: No Food Insecurity   Worried About Programme researcher, broadcasting/film/video in  the Last Year: Never true   Ran Out of Food in the Last Year: Never true  Transportation Needs: No Transportation Needs   Lack of Transportation (Medical): No   Lack of Transportation (Non-Medical): No  Physical Activity: Inactive   Days of Exercise per Week: 0 days   Minutes of Exercise per Session: 0 min  Stress: No Stress Concern Present   Feeling of Stress : Not at all  Social Connections: Socially Integrated   Frequency of Communication with Friends and Family: More than three times a week   Frequency of Social Gatherings with Friends and Family: More than three times a week   Attends Religious  Services: More than 4 times per year   Active Member of Clubs or Organizations: Yes   Attends Engineer, structural: More than 4 times per year   Marital Status: Married  Catering manager Violence: Not At Risk   Fear of Current or Ex-Partner: No   Emotionally Abused: No   Physically Abused: No   Sexually Abused: No    Past Surgical History:  Procedure Laterality Date   ABDOMINAL AORTAGRAM  Oct. 9, 2014   Dr. Imogene Burn   ABDOMINAL Ronny Flurry N/A 03/15/2013   Procedure: ABDOMINAL Ronny Flurry;  Surgeon: Fransisco Hertz, MD;  Location: [facility] CATH LAB;  Service: Cardiovascular;  Laterality: N/A;   ANTERIOR CERVICAL DECOMP/DISCECTOMY FUSION  ~ 2000   CARDIOVERSION N/A 12/03/2019   Procedure: CARDIOVERSION;  Surgeon: Elease Hashimoto, Deloris Ping, MD;  Location: [facility] ENDOSCOPY;  Service: Cardiovascular;  Laterality: N/A;   CATARACT EXTRACTION Right    CORONARY ARTERY BYPASS GRAFT  2001   "CABG X3" (05/14/2013)   DENTAL SURGERY     "multiple teeth removed; trmimed top of mouth so dentures would fit" (05/14/2013)   EYE SURGERY     cataracts   FEMORAL-POPLITEAL BYPASS GRAFT Right 04/25/2013   Procedure: RIGHT FEMORAL TO ABOVE KNEE POPLITEAL BYPASS GRAFT WITH RIGHT GREATER SAPHENOUS VEIN HARVEST; ULTRASOUND GUIDED;  Surgeon: Fransisco Hertz, MD;  Location: [facility] OR;  Service: Vascular;  Laterality: Right;   GUM SURGERY  [phone]   "had bone scraped; had periodontal disease" (05/14/2013)   PALATE / UVULA BIOPSY / EXCISION  2003   Removed due to sleep apnea   TEE WITHOUT CARDIOVERSION N/A 06/13/2020   Procedure: TRANSESOPHAGEAL ECHOCARDIOGRAM (TEE);  Surgeon: Sande Rives, MD;  Location: [facility] ENDOSCOPY;  Service: Cardiovascular;  Laterality: N/A;   TONSILLECTOMY      Family History  Problem Relation Age of Onset   Hyperlipidemia Mother    Heart disease Mother    Diabetes Mother    Hypertension Mother    Heart disease Father    Asthma Father    Arthritis Father    Heart disease Sister    Heart disease Sister         s/p bypass x 2   Lupus Sister    Heart disease Brother        MI waiting on heart transplant when died   Heart disease Brother        massive MI   Cancer Neg Hx     Allergies  Allergen Reactions   Adhesive [Tape] Rash   Latex Rash   Other Hives, Swelling and Rash    Plastic shopping bags Metal staples: rash, swelling    Current Outpatient Medications on File Prior to Visit  Medication Sig Dispense Refill   atenolol (TENORMIN) 25 MG tablet TAKE ONE (1) TABLET BY MOUTH EVERY DAY 90 tablet  1   atorvastatin (LIPITOR) 40 MG tablet TAKE ONE (1) TABLET BY MOUTH EACH DAY AT 6PM *DUE FOR APPOINTMENT AND LABS IN MARCH* 90 tablet 0   Cholecalciferol (VITAMIN D) 50 MCG (2000 UT) tablet Take 2,000 Units by mouth daily.      ELIQUIS 5 MG TABS tablet TAKE ONE (1) TABLET BY MOUTH TWO (2) TIMES DAILY 60 tablet 3   latanoprost (XALATAN) 0.005 % ophthalmic solution Place 1 drop into both eyes at bedtime.      levothyroxine (SYNTHROID) 75 MCG tablet TAKE ONE (1) TABLET BY MOUTH EVERY DAY 90 tablet 1   loratadine (CLARITIN) 10 MG tablet Take 10 mg by mouth daily as needed for allergies.     losartan (COZAAR) 25 MG tablet TAKE ONE (1) TABLET BY MOUTH EVERY DAY 30 tablet 5   metFORMIN (GLUCOPHAGE-XR) 500 MG 24 hr tablet TAKE TWO (2) TABLETS BY MOUTH EVERY MORNING WITH BREAKFAST *DUE FOR APPOINTMENT AND LABS IN MARCH* 180 tablet 0   nitroGLYCERIN (NITROSTAT) 0.4 MG SL tablet Place 1 tablet (0.4 mg total) under the tongue every 5 (five) minutes as needed for chest pain. 25 tablet 11   ONETOUCH DELICA LANCETS 33G MISC USE TO CHECK BLOOD SUGAR ONCE DAILY 100 each 1   ONETOUCH ULTRA test strip USE 1 STRIP DAILY 100 each 12   tamsulosin (FLOMAX) 0.4 MG CAPS capsule TAKE ONE CAPSULE BY MOUTH DAILY 30 capsule 5   No current facility-administered medications on file prior to visit.    BP (!) 118/56   Pulse 74   Resp 18   Ht 6' (1.829 m)   Wt 194 lb (88 kg)   SpO2 100%   BMI 26.31 kg/m        Objective:   Physical Exam  General Mental Status- Alert. General Appearance- Not in acute distress.   Skin General: Color- Normal Color. Moisture- Normal Moisture.  Neck Carotid Arteries- Normal color. Moisture- Normal Moisture. No carotid bruits. No JVD.  Chest and Lung Exam Auscultation: Breath Sounds:-Normal.  Cardiovascular Auscultation:Rythm- Regular. Murmurs & Other Heart Sounds:Auscultation of the heart reveals- No Murmurs.  Abdomen Inspection:-Inspeection Normal. Palpation/Percussion:Note:No mass. Palpation and Percussion of the abdomen reveal- Non Tender, Non Distended + BS, no rebound or guarding.   Neurologic Cranial Nerve exam:- CN III-XII intact(No nystagmus), symmetric smile. Strength:- 5/5 equal and symmetric strength both upper and lower extremities.      Assessment & Plan:   Patient Instructions  Prior episodes of dizziness and unstable gait have improved.  No further falls and glad to hear that you are using a cane regularly.  Physical therapy referral placed and appointment in early April.  We reviewed ultrasound report today and I did send a message to referral staff to inquire about your CT head without contrast order that I placed on last visit.  Hopefully I will get updated on that the next day.  Hyperlipidemia-continue current statin.  Diabetes with last A1c of 6.7.  You declined getting A1c today but the report low fasting sugars in the morning.  Asked that you check fasting blood sugars 1 morning and then the following morning check of blood sugar postmeal to see if blood sugars increasing beyond the 150.  If you see postmeal sugars above 150 then would recommend doing an A1c.   At this point can follow-up in 3 months or sooner if needed.(This is provided gait becomes progressively more stable with PT and the dizziness tapers off.)    Esperanza Richters, PA-C

## 2021-08-25 NOTE — Patient Instructions (Addendum)
Prior episodes of dizziness and unstable gait have improved.  No further falls and glad to hear that you are using a cane regularly.  Physical therapy referral placed and appointment in early April.  We reviewed ultrasound report today and I did send a message to referral staff to inquire about your CT head without contrast order that I placed on last visit.  Hopefully I will get updated on that the next day. ? ?Hyperlipidemia-continue current statin. ? ?Diabetes with last A1c of 6.7.  You declined getting A1c today but the report low fasting sugars in the morning.  Asked that you check fasting blood sugars 1 morning and then the following morning check of blood sugar postmeal to see if blood sugars increasing beyond the 150.  If you see postmeal sugars above 150 then would recommend doing an A1c. ? ? ?At this point can follow-up in 3 months or sooner if needed.(This is provided gait becomes progressively more stable with PT and the dizziness tapers off.) ? ?

## 2021-08-26 ENCOUNTER — Ambulatory Visit (HOSPITAL_BASED_OUTPATIENT_CLINIC_OR_DEPARTMENT_OTHER)
Admission: RE | Admit: 2021-08-26 | Discharge: 2021-08-26 | Disposition: A | Discharge status: Home / Self Care | Payer: Medicare Other | Source: Ambulatory Visit | Attending: Medical | Admitting: Medical

## 2021-08-26 ENCOUNTER — Other Ambulatory Visit: Payer: Self-pay

## 2021-08-26 DIAGNOSIS — S0003XA Contusion of scalp, initial encounter: Secondary | ICD-10-CM | POA: Diagnosis not present

## 2021-08-26 DIAGNOSIS — H814 Vertigo of central origin: Secondary | ICD-10-CM | POA: Insufficient documentation

## 2021-08-26 DIAGNOSIS — R42 Dizziness and giddiness: Secondary | ICD-10-CM | POA: Insufficient documentation

## 2021-08-26 NOTE — Addendum Note (Signed)
Addended by: Anabel Halon on: 08/26/2021 04:44 PM ? ? Modules accepted: Orders ? ?

## 2021-08-26 NOTE — Telephone Encounter (Signed)
Called pt and he made me aware he was scheduled for today at 1 pm ?

## 2021-09-03 NOTE — Progress Notes (Signed)
? ?Assessment/Plan:  ? ?1.  Gait instability with recent falls ? -Patient thinks that it overall came on pretty gradually, but it seems that he had a number of falls within a few months.  However, those did resolve once he got a cane. ? -I think that the falls may have been associated with some dizziness, but a bit unclear. ? -Patient does have clear evidence of peripheral neuropathy, which may have contributed.  He is diabetic, which is the likely source of peripheral neuropathy, although diabetes is pretty well controlled. ? -pt with evidence of B12 deficiency, which also contributes to neuropathy.  This is very mild.  Discussed with the patient that neurologically, would like to see B12 levels greater than 400.  Would recommend oral B12 supplementation, 1000 mcg daily. ? -We will go ahead and order MRI brain and MRA brain given that falls were fairly recent and there was components of retropulsion. ? -His examination looks pretty good today and I do not see any evidence of a neurodegenerative process. ? -I encouraged the patient to go through with physical therapy, and patient states that he already has this scheduled at [facility]. ? -Follow-up will depend on the results of the above. ? ? ?Subjective:  ? ?Harry Brown was seen today in neurologic consultation at the request of Harry Brown. Pt with wife who supplements hx.   The consultation is for the evaluation of dizziness and falls.  I saw the patient about a year ago for complaints of tremor (although none was really seen on examination).  Medical records available are reviewed.  Patient saw PA for presenting complaint on March 6.  Patient with mild dizziness that started about 2 months ago.  He has had more falls in February.   He states that all this came on gradually - not very acute.  He states that the falls are related to the dizziness.  Most of the falls are backward except for the first time he fell in the trashcan.  He had  about 5 falls in [place] of March.   With the first, he was pushing the trash can and it didn't go and he fell in it.   One of the falls, he was climbing a stool to change a smoke alarm battery and looking upward and he fell off.  With one of the falls he was climbing up steps into the garage and he fell backward and his head hit the concrete.  With one, he was walking outside at church and just fell backward and his head on the concrete.  He didn't get hurt.  No LOC.  With the last fall, he was outside and had a shovel and put it in the ground and just fell backward.  He doesn't have a feeling like he is getting pushed backward.  No diplopia.  He is dragging his feet per wife with ambulation.  No trouble swallowing food.    CT brain was ordered and completed March 22.  That was nonacute.  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  Carotid ultrasound was completed and done on March 7.  There is less than 50% stenosis bilaterally.  He followed up with his PA on March 21 and reported that episodes of dizziness and unstable gait had improved and he had had no further falls.  Patient referred here for further evaluation.  Last fall was about a month ago which was when he got his  cane.  No new medications.   ? ? ? ? ?ALLERGIES:   ?Allergies  ?Allergen Reactions  ? Adhesive [Tape] Rash  ? Latex Rash  ? Other Hives, Swelling and Rash  ?  Plastic shopping bags ?Metal staples: rash, swelling  ? ? ?CURRENT MEDICATIONS:  ?Outpatient Encounter Medications as of 09/04/2021  ?Medication Sig  ? atenolol (TENORMIN) 25 MG tablet TAKE ONE (1) TABLET BY MOUTH EVERY DAY  ? atorvastatin (LIPITOR) 40 MG tablet TAKE ONE (1) TABLET BY MOUTH EACH DAY AT 6PM *DUE FOR APPOINTMENT AND LABS IN MARCH*  ? Cholecalciferol (VITAMIN D) 50 MCG (2000 UT) tablet Take 2,000 Units by mouth daily.   ? ELIQUIS 5 MG TABS tablet TAKE ONE (1) TABLET BY MOUTH TWO (2) TIMES DAILY  ? latanoprost (XALATAN) 0.005 % ophthalmic  solution Place 1 drop into both eyes at bedtime.   ? levothyroxine (SYNTHROID) 75 MCG tablet TAKE ONE (1) TABLET BY MOUTH EVERY DAY  ? loratadine (CLARITIN) 10 MG tablet Take 10 mg by mouth daily as needed for allergies.  ? losartan (COZAAR) 25 MG tablet TAKE ONE (1) TABLET BY MOUTH EVERY DAY  ? metFORMIN (GLUCOPHAGE-XR) 500 MG 24 hr tablet TAKE TWO (2) TABLETS BY MOUTH EVERY MORNING WITH BREAKFAST *DUE FOR APPOINTMENT AND LABS IN MARCH*  ? ONETOUCH DELICA LANCETS 43X MISC USE TO CHECK BLOOD SUGAR ONCE DAILY  ? ONETOUCH ULTRA test strip USE 1 STRIP DAILY  ? tamsulosin (FLOMAX) 0.4 MG CAPS capsule TAKE ONE CAPSULE BY MOUTH DAILY  ? nitroGLYCERIN (NITROSTAT) 0.4 MG SL tablet Place 1 tablet (0.4 mg total) under the tongue every 5 (five) minutes as needed for chest pain.  ? ?No facility-administered encounter medications on file as of 09/04/2021.  ? ? ?Objective:  ? ?PHYSICAL EXAMINATION:   ? ?VITALS:   ?Vitals:  ? 09/04/21 1313  ?BP: 123/74  ?Pulse: 68  ?SpO2: 96%  ?Weight: 193 lb 9.6 oz (87.8 kg)  ? ? ?GEN:  The patient appears stated age and is in NAD. ?HEENT:  Normocephalic, atraumatic.  The mucous membranes are moist. The superficial temporal arteries are without ropiness or tenderness. ?CV:  RRR ?Lungs:  CTAB ?Neck/HEME:  There are no carotid bruits bilaterally. ?  ?Neurological examination: ?  ?Orientation: The patient is alert and oriented x3.  ?Cranial nerves: There is good facial symmetry.  Extraocular muscles are intact. No diplopia.  No square wave jerks.  The visual fields are full to confrontational testing. The speech is fluent and clear. Soft palate rises symmetrically and there is no tongue deviation. Hearing is intact to conversational tone. ?Sensation: Sensation is intact to light touch throughout (facial, trunk, extremities). Vibration is decreased at the bilateral big toe, and overall decreased distally. There is no extinction with double simultaneous stimulation.  ?Motor: Strength is 5/5 in the  bilateral upper and lower extremities.   Shoulder shrug is equal and symmetric.  There is no pronator drift. ?Deep tendon reflexes: Deep tendon reflexes are 1/4 at the bilateral biceps, triceps, brachioradialis, patella and achilles. Plantar responses are downgoing bilaterally. ?  ?Movement examination: ?Tone: There is normal tone in the bilateral upper extremities.  The tone in the lower extremities is normal.  ?Abnormal movements: No rest tremor.  No postural tremor.  No intention tremor.  No trouble with Archimedes spirals bilaterally.  ?Coordination:  There is no decremation with RAM's, with any form of RAMS, including alternating supination and pronation of the forearm, hand opening and closing, finger taps, heel taps  and toe taps.  He is able to do F-N with eyes closed bilaterally ?Gait and Station: The patient has no difficulty arising out of a deep-seated chair without the use of the hands. The patient's stride length is good.  He is wide-based.  He is not shuffling. ? ?I have reviewed and interpreted the following labs independently ?Lab Results  ?Component Value Date  ? TSH 4.47 09/01/2020  ? ?Lab Results  ?Component Value Date  ? BBUYZJQD64 360 08/10/2021  ? ? ?Lab Results  ?Component Value Date  ? HGBA1C 6.7 (H) 09/01/2020  ? ? ? ? ?Total time spent on today's visit was 13minutes, including both face-to-face time and nonface-to-face time.  Time included that spent on review of records (prior notes available to me/labs/imaging if pertinent), discussing treatment and goals, answering patient's questions and coordinating care. ? ? ?Cc:  [name], MD ? ?

## 2021-09-04 ENCOUNTER — Other Ambulatory Visit: Payer: Self-pay

## 2021-09-04 ENCOUNTER — Ambulatory Visit: Payer: Medicare Other | Admitting: Neurology

## 2021-09-04 ENCOUNTER — Encounter: Payer: Self-pay | Admitting: Neurology

## 2021-09-04 VITALS — BP 123/74 | HR 68 | Wt 193.6 lb

## 2021-09-04 DIAGNOSIS — R42 Dizziness and giddiness: Secondary | ICD-10-CM

## 2021-09-04 DIAGNOSIS — R296 Repeated falls: Secondary | ICD-10-CM

## 2021-09-04 DIAGNOSIS — E538 Deficiency of other specified B group vitamins: Secondary | ICD-10-CM

## 2021-09-04 DIAGNOSIS — E1142 Type 2 diabetes mellitus with diabetic polyneuropathy: Secondary | ICD-10-CM | POA: Diagnosis not present

## 2021-09-04 DIAGNOSIS — R270 Ataxia, unspecified: Secondary | ICD-10-CM

## 2021-09-04 MED ORDER — DIAZEPAM 5 MG PO TABS: ORAL_TABLET | ORAL | 0 refills | Status: DC

## 2021-09-04 NOTE — Patient Instructions (Addendum)
You have been diagnosed with low B12.  We recommend that you take over the counter B12, 1000 micrograms daily. ? ?We will schedule your MRI and MRA brain.  I sent the Valium as you requested to the pharmacy.  You will need a driver. ? ?The physicians and staff at [facility] are committed to providing excellent care. You may receive a survey requesting feedback about your experience at our office. We strive to receive "very good" responses to the survey questions. If you feel that your experience would prevent you from giving the office a "very good " response, please contact our office to try to remedy the situation. We may be reached at [phone]. Thank you for taking the time out of your busy day to complete the survey. ? ?

## 2021-09-08 ENCOUNTER — Ambulatory Visit: Payer: Medicare Other | Attending: Medical | Admitting: Physical Therapy

## 2021-09-08 ENCOUNTER — Encounter: Payer: Self-pay | Admitting: Physical Therapy

## 2021-09-08 DIAGNOSIS — M6281 Muscle weakness (generalized): Secondary | ICD-10-CM | POA: Diagnosis not present

## 2021-09-08 DIAGNOSIS — R296 Repeated falls: Secondary | ICD-10-CM | POA: Insufficient documentation

## 2021-09-08 DIAGNOSIS — R262 Difficulty in walking, not elsewhere classified: Secondary | ICD-10-CM | POA: Diagnosis not present

## 2021-09-08 DIAGNOSIS — R42 Dizziness and giddiness: Secondary | ICD-10-CM | POA: Insufficient documentation

## 2021-09-08 DIAGNOSIS — Z9181 History of falling: Secondary | ICD-10-CM | POA: Insufficient documentation

## 2021-09-08 DIAGNOSIS — R2681 Unsteadiness on feet: Secondary | ICD-10-CM | POA: Diagnosis not present

## 2021-09-08 NOTE — Addendum Note (Signed)
Addended by: Rennie Natter on: 09/08/2021 04:10 PM ? ? Modules accepted: Orders ? ?

## 2021-09-08 NOTE — Therapy (Addendum)
[place] [facility] ?[address] ?[place], [place], [address] ?Phone: [phone]   Fax:  [phone] ? ?Physical Therapy Evaluation ? ?Patient Details  ?Name: Harry VITANZA Sr. ?MRN: [id number] ?Date of Birth: 12-20-1940 ?Referring Provider (PT): Saguier, Iris Pert ? ? ?Encounter Date: 09/08/2021 ? ? PT End of Session - 09/08/21 1527   ? ? Visit Number 1   ? Number of Visits 16   ? Date for PT Re-Evaluation 11/03/21   ? Authorization Type [employer] Medicare   ? PT Start Time 1450   ? PT Stop Time 1530   ? PT Time Calculation (min) 40 min   ? Activity Tolerance Patient tolerated treatment well   ? Behavior During Therapy [facility] for tasks assessed/performed   ? ?  ?  ? ?  ? ? ?Past Medical History:  ?Diagnosis Date  ? Anemia 09/24/2013  ? CAD (coronary artery disease)   ? Cellulitis 05/14/2013  ? RLE  ? Diverticulitis   ? ED (erectile dysfunction)   ? Glaucoma 08/18/2014  ? History of sleep apnea   ? had surgery to correct  ? Hypertension   ? Hypothyroidism   ? Medicare annual wellness visit, subsequent 04/13/2013  ? Myocardial infarction ([place]) 11/05/1985  ? Nocturia 03/15/2017  ? Other and unspecified hyperlipidemia   ? Penile lesion 02/24/2015  ? Peripheral neuropathy 03/31/2012  ? Peripheral vascular disease ([place])   ? Postoperative anemia due to acute blood loss 09/24/2013  ? Preventative health care 03/13/2016  ? Sleep apnea 09/16/2017  ? Type II diabetes mellitus ([place])   ? fasting 100-120  ? Urinary urgency 11/22/2012  ? ? ?Past Surgical History:  ?Procedure Laterality Date  ? ABDOMINAL AORTAGRAM  Oct. 9, 2014  ? Dr. Bridgett Larsson  ? ABDOMINAL AORTAGRAM N/A 03/15/2013  ? Procedure: ABDOMINAL AORTAGRAM;  Surgeon: Conrad [place], MD;  Location[facility] CATH LAB;  Service: Cardiovascular;  Laterality: N/A;  ? ANTERIOR CERVICAL DECOMP/DISCECTOMY FUSION  ~ 2000  ? CARDIOVERSION N/A 12/03/2019  ? Procedure: CARDIOVERSION;  Surgeon: Acie Fredrickson Wonda Cheng, MD;  Location: [place];  Service:  Cardiovascular;  Laterality: N/A;  ? CATARACT EXTRACTION Right   ? CORONARY ARTERY BYPASS GRAFT  2001  ? "CABG X3" (05/14/2013)  ? DENTAL SURGERY    ? "multiple teeth removed; trmimed top of mouth so dentures would fit" (05/14/2013)  ? EYE SURGERY    ? cataracts  ? FEMORAL-POPLITEAL BYPASS GRAFT Right 04/25/2013  ? Procedure: RIGHT FEMORAL TO ABOVE KNEE POPLITEAL BYPASS GRAFT WITH RIGHT GREATER SAPHENOUS VEIN HARVEST; ULTRASOUND GUIDED;  Surgeon: Conrad Esperance, MD;  Location: [facility] OR;  Service: Vascular;  Laterality: Right;  ? GUM SURGERY  1990's  ? "had bone scraped; had periodontal disease" (05/14/2013)  ? PALATE / UVULA BIOPSY / EXCISION  2003  ? Removed due to sleep apnea  ? TEE WITHOUT CARDIOVERSION N/A 06/13/2020  ? Procedure: TRANSESOPHAGEAL ECHOCARDIOGRAM (TEE);  Surgeon: Geralynn Rile, MD;  Location: [place];  Service: Cardiovascular;  Laterality: N/A;  ? TONSILLECTOMY    ? ? ?There were no vitals filed for this visit. ? ? ? Subjective Assessment - 09/08/21 1458   ? ? Subjective Pt. reports had dizziness, this has improved, noticed  it mostly when standing up quickly, takes his time getting up now and doesn't have this problem.  He has also purchased a cane, when he does lose his balance he is able to regain with the assistance of the  cane.   ? Pertinent History From medical history: Patient with mild dizziness that started about 2 months ago.  He has had more falls in February.   He states that all this came on gradually - not very acute.  He states that the falls are related to the dizziness.  Most of the falls are backward except for the first time he fell in the trashcan.  He had about 5 falls in [place] of March.   With the first, he was pushing the trash can and it didn't go and he fell in it.   One of the falls, he was climbing a stool to change a smoke alarm battery and looking upward and he fell off.  With one of the falls he was climbing up steps into the garage and he fell  backward and his head hit the concrete.  With one, he was walking outside at church and just fell backward and his head on the concrete.  He didn't get hurt.  No LOC.  With the last fall, he was outside and had a shovel and put it in the ground and just fell backward.  He doesn't have a feeling like he is getting pushed backward.  No diplopia.  He is dragging his feet per wife with ambulation.  No trouble swallowing food.    CT brain was ordered and completed March 22.  That was nonacute.  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  Carotid ultrasound was completed and done on March 7.  There is less than 50% stenosis bilaterally.  He followed up with his PA on March 21 and reported that episodes of dizziness and unstable gait had improved and he had had no further falls.   ? Diagnostic tests MRI scheduled,   CT brain was ordered and completed March 22.  That was nonacute.  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  Carotid ultrasound was completed and done on March 7.  There is less than 50% stenosis bilaterally.   ? Patient Stated Goals be steadier, don't fall, be able to stop using cane if possible.   ? Currently in Pain? No/denies   ? ?  ?  ? ?  ? ? ? ? ? [facility] PT Assessment - 09/08/21 0001   ? ?  ? Assessment  ? Medical Diagnosis R42 (ICD-10-CM) - Dizziness  Z91.81 (ICD-10-CM) - Personal history of fall  R26.81 (ICD-10-CM) - Unstable gait   ? Referring Provider (PT) Saguier, Iris Pert   ? Onset Date/Surgical Date --   ongoing  ? Next MD Visit none scheduled   ? Prior Therapy no   ?  ? Precautions  ? Precautions Fall   ?  ? Restrictions  ? Weight Bearing Restrictions No   ?  ? Balance Screen  ? Has the patient fallen in the past 6 months Yes   ? How many times? 5   ? Has the patient had a decrease in activity level because of a fear of falling?  Yes   ? Is the patient reluctant to leave their home because of a fear of falling?  Yes   ?  ? Home Environment  ?  Living Environment Private residence   ? Living Arrangements Spouse/significant other   ? Type of Home House   ? Home Access Stairs to enter   ? Entrance Stairs-Number of Steps 4 front door, 6 steps garage   ? Entrance Stairs-Rails Right;Left;Can  reach both   ? Home Layout One level   ? [place] - single point   ?  ? Prior Function  ? Level of Independence Independent   ? Vocation Retired   ? Leisure church   ?  ? Cognition  ? Overall Cognitive Status Within Functional Limits for tasks assessed   ?  ? Observation/Other Assessments  ? Observations Patient enters with visually slow gait speed and SPC.  No apparent distress.   Noted unsteadiness today multiple times during evaluation, needed close SBA throughout.   ? Focus on Therapeutic Outcomes (FOTO)  balance 56%.  Predicted outcome after 10 visits 60%   ?  ? Sensation  ? Additional Comments periperal neuropathy in LE   ?  ? Posture/Postural Control  ? Posture/Postural Control Postural limitations   ? Postural Limitations Rounded Shoulders;Forward head   ?  ? ROM / Strength  ? AROM / PROM / Strength Strength   ?  ? Strength  ? Overall Strength Deficits   ? Strength Assessment Site Hip   ? Right/Left Hip Right;Left   ? Right Hip Flexion 5/5   ? Right Hip Extension 4/5   ? Right Hip ABduction 5/5   ? Right Hip ADduction 4+/5   ? Left Hip Flexion 5/5   ? Left Hip Extension 4/5   ? Left Hip ABduction 5/5   ? Left Hip ADduction 4+/5   ?  ? Flexibility  ? Soft Tissue Assessment /Muscle Length yes   ? Hamstrings tightness bil, unable to extend knees fully in sitting   ?  ? Ambulation/Gait  ? Ambulation/Gait Yes   ? Ambulation/Gait Assistance 6: Modified independent (Device/Increase time)   ? Ambulation Distance (Feet) 50 Feet   ? Assistive device Straight cane   ? Gait Pattern Step-through pattern   ? Gait velocity 0.5 m/s or 1.67 ft/sec   indicates recurrent fall risk  ?  ? Balance  ? Balance Assessed Yes   ?  ? Static Standing Balance  ? Static Standing -  Balance Support No upper extremity supported   ? Static Standing - Level of Assistance 5: Stand by assistance   ? Static Standing Balance -  Activities  Single Leg Stance - Right Leg;Single Leg Stance - Left Leg;Tandam Stanc

## 2021-09-12 ENCOUNTER — Encounter (HOSPITAL_BASED_OUTPATIENT_CLINIC_OR_DEPARTMENT_OTHER): Payer: Self-pay

## 2021-09-12 ENCOUNTER — Ambulatory Visit (HOSPITAL_BASED_OUTPATIENT_CLINIC_OR_DEPARTMENT_OTHER)
Admission: RE | Admit: 2021-09-12 | Discharge: 2021-09-12 | Disposition: A | Discharge status: Home / Self Care | Payer: Medicare Other | Source: Ambulatory Visit | Attending: Neurology | Admitting: Neurology

## 2021-09-12 DIAGNOSIS — R42 Dizziness and giddiness: Secondary | ICD-10-CM | POA: Insufficient documentation

## 2021-09-12 DIAGNOSIS — R270 Ataxia, unspecified: Secondary | ICD-10-CM

## 2021-09-12 DIAGNOSIS — I6381 Other cerebral infarction due to occlusion or stenosis of small artery: Secondary | ICD-10-CM | POA: Diagnosis not present

## 2021-09-12 DIAGNOSIS — G9389 Other specified disorders of brain: Secondary | ICD-10-CM | POA: Diagnosis not present

## 2021-09-12 DIAGNOSIS — R296 Repeated falls: Secondary | ICD-10-CM | POA: Diagnosis not present

## 2021-09-15 ENCOUNTER — Ambulatory Visit: Payer: Medicare Other

## 2021-09-15 ENCOUNTER — Telehealth: Payer: Self-pay | Admitting: Family Medicine

## 2021-09-15 DIAGNOSIS — Z9181 History of falling: Secondary | ICD-10-CM | POA: Diagnosis not present

## 2021-09-15 DIAGNOSIS — R42 Dizziness and giddiness: Secondary | ICD-10-CM | POA: Diagnosis not present

## 2021-09-15 DIAGNOSIS — R2681 Unsteadiness on feet: Secondary | ICD-10-CM | POA: Diagnosis not present

## 2021-09-15 DIAGNOSIS — R296 Repeated falls: Secondary | ICD-10-CM

## 2021-09-15 DIAGNOSIS — M6281 Muscle weakness (generalized): Secondary | ICD-10-CM | POA: Diagnosis not present

## 2021-09-15 DIAGNOSIS — R262 Difficulty in walking, not elsewhere classified: Secondary | ICD-10-CM | POA: Diagnosis not present

## 2021-09-15 NOTE — Telephone Encounter (Signed)
Pt dropped off envelope with document for Tammy to have. (White small envelope) Envelope put at front office tray under Tammy's name.  ?

## 2021-09-15 NOTE — Therapy (Signed)
[facility] ?[place] ?[place], [place], [address] ?Phone: [phone]   Fax:  [phone] ? ?Physical Therapy Treatment ? ?Patient Details  ?Name: Harry REIFSTECK Sr. ?MRN: [id number] ?Date of Birth: Apr 28, 1941 ?Referring Provider (PT): Saguier, Iris Pert ? ? ?Encounter Date: 09/15/2021 ? ? PT End of Session - 09/15/21 1532   ? ? Visit Number 2   ? Number of Visits 16   ? Date for PT Re-Evaluation 11/03/21   ? Authorization Type UHC Medicare   ? PT Start Time 0932   ? PT Stop Time 1528   ? PT Time Calculation (min) 41 min   ? Activity Tolerance Patient tolerated treatment well   ? Behavior During Therapy [facility] for tasks assessed/performed   ? ?  ?  ? ?  ? ? ?Past Medical History:  ?Diagnosis Date  ? Anemia 09/24/2013  ? CAD (coronary artery disease)   ? Cellulitis 05/14/2013  ? RLE  ? Diverticulitis   ? ED (erectile dysfunction)   ? Glaucoma 08/18/2014  ? History of sleep apnea   ? had surgery to correct  ? Hypertension   ? Hypothyroidism   ? Medicare annual wellness visit, subsequent 04/13/2013  ? Myocardial infarction ([place]) 11/05/1985  ? Nocturia 03/15/2017  ? Other and unspecified hyperlipidemia   ? Penile lesion 02/24/2015  ? Peripheral neuropathy 03/31/2012  ? Peripheral vascular disease ([place])   ? Postoperative anemia due to acute blood loss 09/24/2013  ? Preventative health care 03/13/2016  ? Sleep apnea 09/16/2017  ? Type II diabetes mellitus ([place])   ? fasting 100-120  ? Urinary urgency 11/22/2012  ? ? ?Past Surgical History:  ?Procedure Laterality Date  ? ABDOMINAL AORTAGRAM  Oct. 9, 2014  ? Dr. Bridgett Larsson  ? ABDOMINAL AORTAGRAM N/A 03/15/2013  ? Procedure: ABDOMINAL AORTAGRAM;  Surgeon: Conrad [place]sburg, MD;  Location: [facility] CATH LAB;  Service: Cardiovascular;  Laterality: N/A;  ? ANTERIOR CERVICAL DECOMP/DISCECTOMY FUSION  ~ 2000  ? CARDIOVERSION N/A 12/03/2019  ? Procedure: CARDIOVERSION;  Surgeon: Acie Fredrickson Wonda Cheng, MD;  Location: [facility];  Service:  Cardiovascular;  Laterality: N/A;  ? CATARACT EXTRACTION Right   ? CORONARY ARTERY BYPASS GRAFT  2001  ? "CABG X3" (05/14/2013)  ? DENTAL SURGERY    ? "multiple teeth removed; trmimed top of mouth so dentures would fit" (05/14/2013)  ? EYE SURGERY    ? cataracts  ? FEMORAL-POPLITEAL BYPASS GRAFT Right 04/25/2013  ? Procedure: RIGHT FEMORAL TO ABOVE KNEE POPLITEAL BYPASS GRAFT WITH RIGHT GREATER SAPHENOUS VEIN HARVEST; ULTRASOUND GUIDED;  Surgeon: Conrad Wishek, MD;  Location: [facility];  Service: Vascular;  Laterality: Right;  ? GUM SURGERY  1990's  ? "had bone scraped; had periodontal disease" (05/14/2013)  ? PALATE / UVULA BIOPSY / EXCISION  2003  ? Removed due to sleep apnea  ? TEE WITHOUT CARDIOVERSION N/A 06/13/2020  ? Procedure: TRANSESOPHAGEAL ECHOCARDIOGRAM (TEE);  Surgeon: Geralynn Rile, MD;  Location: [facility];  Service: Cardiovascular;  Laterality: N/A;  ? TONSILLECTOMY    ? ? ?There were no vitals filed for this visit. ? ? Subjective Assessment - 09/15/21 1450   ? ? Subjective Pt reports falling at a funeral over the weekend going down a ramp, had his cane with him but just fell backwards.   ? Pertinent History From medical history: Patient with mild dizziness that started about 2 months ago.  He has had more falls in February.  He states that all this came on gradually - not very acute.  He states that the falls are related to the dizziness.  Most of the falls are backward except for the first time he fell in the trashcan.  He had about 5 falls in [place] of March.   With the first, he was pushing the trash can and it didn't go and he fell in it.   One of the falls, he was climbing a stool to change a smoke alarm battery and looking upward and he fell off.  With one of the falls he was climbing up steps into the garage and he fell backward and his head hit the concrete.  With one, he was walking outside at church and just fell backward and his head on the concrete.  He didn't get hurt.  No  LOC.  With the last fall, he was outside and had a shovel and put it in the ground and just fell backward.  He doesn't have a feeling like he is getting pushed backward.  No diplopia.  He is dragging his feet per wife with ambulation.  No trouble swallowing food.    CT brain was ordered and completed March 22.  That was nonacute.  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  Carotid ultrasound was completed and done on March 7.  There is less than 50% stenosis bilaterally.  He followed up with his PA on March 21 and reported that episodes of dizziness and unstable gait had improved and he had had no further falls.   ? Diagnostic tests MRI scheduled,   CT brain was ordered and completed March 22.  That was nonacute.  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  Carotid ultrasound was completed and done on March 7.  There is less than 50% stenosis bilaterally.   ? Patient Stated Goals be steadier, don't fall, be able to stop using cane if possible.   ? Currently in Pain? No/denies   ? ?  ?  ? ?  ? ? ? ? ? [facility] PT Assessment - 09/15/21 0001   ? ?  ? Standardized Balance Assessment  ? Standardized Balance Assessment Dynamic Gait Index   ?  ? Dynamic Gait Index  ? Level Surface Mild Impairment   ? Change in Gait Speed Mild Impairment   ? Gait with Horizontal Head Turns Mild Impairment   ? Gait with Vertical Head Turns Moderate Impairment   ? Gait and Pivot Turn Moderate Impairment   ? Step Over Obstacle Moderate Impairment   ? Step Around Obstacles Mild Impairment   ? Steps Moderate Impairment   ? Total Score 12   ?  ? Functional Gait  Assessment  ? Gait assessed  Yes   ? ?  ?  ? ?  ? ? ? ? ? ? ? ? ? ? ? ? ? ? ? ? [facility] Adult PT Treatment/Exercise - 09/15/21 0001   ? ?  ? Exercises  ? Exercises Lumbar   ?  ? Lumbar Exercises: Aerobic  ? Recumbent Bike L1x40min   ? ?  ?  ? ?  ? ? ? ? ? ? Balance Exercises - 09/15/21 0001   ? ?  ? OTAGO PROGRAM  ? Head Movements Standing;5 reps    ? Neck Movements Standing;5 reps   ? Back Extension Standing;5 reps   ? Trunk Movements Standing;5 reps   ? Ankle Movements Standing;10  reps   ? Knee Extensor 10 reps   ? Knee Flexor 10 reps   ? Hip ABductor 10 reps   ? ?  ?  ? ?  ? ? ? ? ? ? ? PT Short Term Goals - 09/15/21 1511   ? ?  ? PT SHORT TERM GOAL #1  ? Title Pt will complete DGI to further assess gait/balance   ? Time 2   ? Period Weeks   ? Status Achieved   ? Target Date 09/22/21   ? ?  ?  ? ?  ? ? ? ? PT Long Term Goals - 09/15/21 1635   ? ?  ? PT LONG TERM GOAL #1  ? Title Pt. will be independent with advanced HEP to improve LE strength and stability.   ? Time 8   ? Period Weeks   ? Status On-going   ? Target Date 11/03/21   ?  ? PT LONG TERM GOAL #2  ? Title Pt. will demonstrate decreased need for AD as demonstrated by Merrilee Jansky 50/56 or better.   ? Baseline 42/56 moderate risk for falls, indicates need for cane inside and outside.   ? Time 8   ? Period Weeks   ? Status On-going   ? Target Date 11/03/21   ?  ? PT LONG TERM GOAL #3  ? Title Pt. will demonstrate decreased risk of falls as demonstrated by DGI with goal TBD.   ? Time 8   ? Period Weeks   ? Status On-going   ? Target Date 11/03/21   ?  ? PT LONG TERM GOAL #4  ? Title Pt. will be able to walk over even and uneven surfaces/curbs and ramps safely without instability or AD.   ? Baseline difficulty on compliant surfaces, turns, also has frequent falls outdoors, on steps.   ? Time 8   ? Period Weeks   ? Status On-going   ? Target Date 11/03/21   ?  ? PT LONG TERM GOAL #5  ? Title Pt. will be able to maintain SLS x 10 sec bil to demonstrate improved hip strength and balance.   ? Baseline unable to maintain SLS, loses balance immediately without support.   ? Time 8   ? Period Weeks   ? Status On-going   ? Target Date 11/03/21   ? ?  ?  ? ?  ? ? ? ? ? ? ? ? Plan - 09/15/21 1532   ? ? Clinical Impression Statement Pt scored 12/24 on DGI, so he is at high risk for falls. He has had one fall since eval  last week, at which he fell backwards walking on a ramp but had his cane with him. Introduced [employer] and reviewed wth pt. Close supervision and cues required for safety and for postural stability.   ? Pe

## 2021-09-17 ENCOUNTER — Encounter: Payer: Self-pay | Admitting: Physical Therapy

## 2021-09-17 ENCOUNTER — Ambulatory Visit: Payer: Medicare Other | Admitting: Physical Therapy

## 2021-09-17 DIAGNOSIS — Z9181 History of falling: Secondary | ICD-10-CM | POA: Diagnosis not present

## 2021-09-17 DIAGNOSIS — R2681 Unsteadiness on feet: Secondary | ICD-10-CM

## 2021-09-17 DIAGNOSIS — R296 Repeated falls: Secondary | ICD-10-CM | POA: Diagnosis not present

## 2021-09-17 DIAGNOSIS — R42 Dizziness and giddiness: Secondary | ICD-10-CM | POA: Diagnosis not present

## 2021-09-17 DIAGNOSIS — R262 Difficulty in walking, not elsewhere classified: Secondary | ICD-10-CM

## 2021-09-17 DIAGNOSIS — M6281 Muscle weakness (generalized): Secondary | ICD-10-CM

## 2021-09-17 NOTE — Therapy (Signed)
[facility] ?[place] ?[place], [place], [address] ?Phone: [phone]   Fax:  [phone] ? ?Physical Therapy Treatment ? ?Patient Details  ?Name: Harry RISHEL Sr. ?MRN: [id number] ?Date of Birth: Jan 25, 1941 ?Referring Provider (PT): Saguier, Iris Pert ? ? ?Encounter Date: 09/17/2021 ? ? PT End of Session - 09/17/21 1452   ? ? Visit Number 3   ? Number of Visits 16   ? Date for PT Re-Evaluation 11/03/21   ? Authorization Type UHC Medicare   ? PT Start Time 4287   ? PT Stop Time 6811   ? PT Time Calculation (min) 43 min   ? Activity Tolerance Patient tolerated treatment well   ? Behavior During Therapy [facility] for tasks assessed/performed   ? ?  ?  ? ?  ? ? ?Past Medical History:  ?Diagnosis Date  ? Anemia 09/24/2013  ? CAD (coronary artery disease)   ? Cellulitis 05/14/2013  ? RLE  ? Diverticulitis   ? ED (erectile dysfunction)   ? Glaucoma 08/18/2014  ? History of sleep apnea   ? had surgery to correct  ? Hypertension   ? Hypothyroidism   ? Medicare annual wellness visit, subsequent 04/13/2013  ? Myocardial infarction ([place]) 11/05/1985  ? Nocturia 03/15/2017  ? Other and unspecified hyperlipidemia   ? Penile lesion 02/24/2015  ? Peripheral neuropathy 03/31/2012  ? Peripheral vascular disease ([place])   ? Postoperative anemia due to acute blood loss 09/24/2013  ? Preventative health care 03/13/2016  ? Sleep apnea 09/16/2017  ? Type II diabetes mellitus ([place])   ? fasting 100-120  ? Urinary urgency 11/22/2012  ? ? ?Past Surgical History:  ?Procedure Laterality Date  ? ABDOMINAL AORTAGRAM  Oct. 9, 2014  ? Dr. Bridgett Larsson  ? ABDOMINAL AORTAGRAM N/A 03/15/2013  ? Procedure: ABDOMINAL AORTAGRAM;  Surgeon: Conrad Barren, MD;  Location: [facility] CATH LAB;  Service: Cardiovascular;  Laterality: N/A;  ? ANTERIOR CERVICAL DECOMP/DISCECTOMY FUSION  ~ 2000  ? CARDIOVERSION N/A 12/03/2019  ? Procedure: CARDIOVERSION;  Surgeon: Acie Fredrickson Wonda Cheng, MD;  Location: [facility];  Service:  Cardiovascular;  Laterality: N/A;  ? CATARACT EXTRACTION Right   ? CORONARY ARTERY BYPASS GRAFT  2001  ? "CABG X3" (05/14/2013)  ? DENTAL SURGERY    ? "multiple teeth removed; trmimed top of mouth so dentures would fit" (05/14/2013)  ? EYE SURGERY    ? cataracts  ? FEMORAL-POPLITEAL BYPASS GRAFT Right 04/25/2013  ? Procedure: RIGHT FEMORAL TO ABOVE KNEE POPLITEAL BYPASS GRAFT WITH RIGHT GREATER SAPHENOUS VEIN HARVEST; ULTRASOUND GUIDED;  Surgeon: Conrad [place], MD;  Location: [facility] OR;  Service: Vascular;  Laterality: Right;  ? GUM SURGERY  1990's  ? "had bone scraped; had periodontal disease" (05/14/2013)  ? PALATE / UVULA BIOPSY / EXCISION  2003  ? Removed due to sleep apnea  ? TEE WITHOUT CARDIOVERSION N/A 06/13/2020  ? Procedure: TRANSESOPHAGEAL ECHOCARDIOGRAM (TEE);  Surgeon: Geralynn Rile, MD;  Location: [facility];  Service: Cardiovascular;  Laterality: N/A;  ? TONSILLECTOMY    ? ? ?There were no vitals filed for this visit. ? ? Subjective Assessment - 09/17/21 1451   ? ? Subjective Pt denies new falls.  Reports compliance with OTAGO exercises given last session.   ? Pertinent History From medical history: Patient with mild dizziness that started about 2 months ago.  He has had more falls in February.   He states that all this came on gradually -  not very acute.  He states that the falls are related to the dizziness.  Most of the falls are backward except for the first time he fell in the trashcan.  He had about 5 falls in [place] of March.   With the first, he was pushing the trash can and it didn't go and he fell in it.   One of the falls, he was climbing a stool to change a smoke alarm battery and looking upward and he fell off.  With one of the falls he was climbing up steps into the garage and he fell backward and his head hit the concrete.  With one, he was walking outside at church and just fell backward and his head on the concrete.  He didn't get hurt.  No LOC.  With the last fall, he was  outside and had a shovel and put it in the ground and just fell backward.  He doesn't have a feeling like he is getting pushed backward.  No diplopia.  He is dragging his feet per wife with ambulation.  No trouble swallowing food.    CT brain was ordered and completed March 22.  That was nonacute.  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  Carotid ultrasound was completed and done on March 7.  There is less than 50% stenosis bilaterally.  He followed up with his PA on March 21 and reported that episodes of dizziness and unstable gait had improved and he had had no further falls.   ? Diagnostic tests MRI scheduled,   CT brain was ordered and completed March 22.  That was nonacute.  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  Carotid ultrasound was completed and done on March 7.  There is less than 50% stenosis bilaterally.   ? Patient Stated Goals be steadier, don't fall, be able to stop using cane if possible.   ? Currently in Pain? No/denies   ? ?  ?  ? ?  ? ? ? ? ? ? ? ? ? ? ? ? ? ? ? ? ? ? ? ? [facility] Adult PT Treatment/Exercise - 09/17/21 0001   ? ?  ? Lumbar Exercises: Aerobic  ? Nustep L5 x 6 min   ? ?  ?  ? ?  ? ? ? ? ? ? Balance Exercises - 09/17/21 0001   ? ?  ? OTAGO PROGRAM  ? Knee Flexor 20 reps   ? Hip ABductor 20 reps   ? Ankle Plantorflexors 20 reps, support   ? Ankle Dorsiflexors 20 reps, support   ? Knee Bends 20 reps, support   ? Backwards Walking Support   ? Walking and Turning Around Assistive device   cues to use cane and not carry in hand, LOB x 1  ? Sideways Walking No assistive device   ? Tandem Stance 10 seconds, support   ? Tandem Walk Support   ? One Leg Stand 10 seconds, support   ? Heel Walking Support   ? Toe Walk Support   ? Heel Toe Walking Backward --   support  ? Sit to Stand 10 reps, no support   ? ?  ?  ? ?  ? ? ? ? ? ? ? PT Short Term Goals - 09/15/21 1511   ? ?  ? PT SHORT TERM GOAL #1  ? Title Pt will complete DGI to further assess  gait/balance   ? Time 2   ?  Period Weeks   ? Status Achieved   ? Target Date 09/22/21   ? ?  ?  ? ?  ? ? ? ? PT Long Term Goals - 09/17/21 1822   ? ?  ? PT LONG TERM GOAL #1  ? Title Pt. will be independent with advanced HEP to improve LE strength and stability.   ? Time 8   ? Period Weeks   ? Status On-going   ? Target Date 11/03/21   ?  ? PT LONG TERM GOAL #2  ? Title Pt. will demonstrate decreased need for AD as demonstrated by Merrilee Jansky 50/56 or better.   ? Baseline 42/56 moderate risk for falls, indicates need for cane inside and outside.   ? Time 8   ? Period Weeks   ? Status On-going   ? Target Date 11/03/21   ?  ? PT LONG TERM GOAL #3  ? Title Pt. will demonstrate decreased risk of falls as demonstrated by DGI of >19/24   ? Baseline 12/24 - high risk for falls.   ? Time 8   ? Period Weeks   ? Status On-going   ? Target Date 11/03/21   ?  ? PT LONG TERM GOAL #4  ? Title Pt. will be able to walk over even and uneven surfaces/curbs and ramps safely without instability or AD.   ? Baseline difficulty on compliant surfaces, turns, also has frequent falls outdoors, on steps.   ? Time 8   ? Period Weeks   ? Status On-going   ? Target Date 11/03/21   ?  ? PT LONG TERM GOAL #5  ? Title Pt. will be able to maintain SLS x 10 sec bil to demonstrate improved hip strength and balance.   ? Baseline unable to maintain SLS, loses balance immediately without support.   ? Time 8   ? Period Weeks   ? Status On-going   ? Target Date 11/03/21   ? ?  ?  ? ?  ? ? ? ? ? ? ? ? Plan - 09/17/21 1819   ? ? Clinical Impression Statement Continued to review OTAGO exercises today.  He required close supervision, and continued to try to walk without using cane, just holding it, as a result has large LOB when doing figure 8 exercise.  Educated on importance of using a cane for safety with ambulation, and providing more proprioceptive input to the brain to help steady self, as the cane is not designed to stop a fall after he's lost his balance.   After discussion noted that he was then walking with cane appropriately.  Also recommended adjustable ankle weights to use with OTAGO program to help build LE strength.   He would benefit from continued skille

## 2021-09-22 ENCOUNTER — Other Ambulatory Visit: Payer: Self-pay | Admitting: Family Medicine

## 2021-09-22 ENCOUNTER — Ambulatory Visit: Payer: Medicare Other

## 2021-09-22 DIAGNOSIS — R42 Dizziness and giddiness: Secondary | ICD-10-CM | POA: Diagnosis not present

## 2021-09-22 DIAGNOSIS — R262 Difficulty in walking, not elsewhere classified: Secondary | ICD-10-CM

## 2021-09-22 DIAGNOSIS — M6281 Muscle weakness (generalized): Secondary | ICD-10-CM | POA: Diagnosis not present

## 2021-09-22 DIAGNOSIS — R2681 Unsteadiness on feet: Secondary | ICD-10-CM | POA: Diagnosis not present

## 2021-09-22 DIAGNOSIS — Z9181 History of falling: Secondary | ICD-10-CM | POA: Diagnosis not present

## 2021-09-22 DIAGNOSIS — R296 Repeated falls: Secondary | ICD-10-CM | POA: Diagnosis not present

## 2021-09-22 NOTE — Therapy (Signed)
[place] [facility] ?[place] ?[place], [place], [address] ?Phone: [phone]   Fax:  [phone] ? ?Physical Therapy Treatment ? ?Patient Details  ?Name: Harry GRAEFE Sr. ?MRN: [id number] ?Date of Birth: 01/10/1941 ?Referring Provider (PT): Saguier, Iris Pert ? ? ?Encounter Date: 09/22/2021 ? ? PT End of Session - 09/22/21 1530   ? ? Visit Number 4   ? Number of Visits 16   ? Date for PT Re-Evaluation 11/03/21   ? Authorization Type [employer] Medicare   ? PT Start Time 1448   ? PT Stop Time 1528   ? PT Time Calculation (min) 40 min   ? Activity Tolerance Patient tolerated treatment well   ? Behavior During Therapy [facility] for tasks assessed/performed   ? ?  ?  ? ?  ? ? ?Past Medical History:  ?Diagnosis Date  ? Anemia 09/24/2013  ? CAD (coronary artery disease)   ? Cellulitis 05/14/2013  ? RLE  ? Diverticulitis   ? ED (erectile dysfunction)   ? Glaucoma 08/18/2014  ? History of sleep apnea   ? had surgery to correct  ? Hypertension   ? Hypothyroidism   ? Medicare annual wellness visit, subsequent 04/13/2013  ? Myocardial infarction ([place]) 11/05/1985  ? Nocturia 03/15/2017  ? Other and unspecified hyperlipidemia   ? Penile lesion 02/24/2015  ? Peripheral neuropathy 03/31/2012  ? Peripheral vascular disease ([place])   ? Postoperative anemia due to acute blood loss 09/24/2013  ? Preventative health care 03/13/2016  ? Sleep apnea 09/16/2017  ? Type II diabetes mellitus ([place])   ? fasting 100-120  ? Urinary urgency 11/22/2012  ? ? ?Past Surgical History:  ?Procedure Laterality Date  ? ABDOMINAL AORTAGRAM  Oct. 9, 2014  ? Dr. Bridgett Larsson  ? ABDOMINAL AORTAGRAM N/A 03/15/2013  ? Procedure: ABDOMINAL AORTAGRAM;  Surgeon: Conrad Mapleton, MD;  Location: [facility] CATH LAB;  Service: Cardiovascular;  Laterality: N/A;  ? ANTERIOR CERVICAL DECOMP/DISCECTOMY FUSION  ~ 2000  ? CARDIOVERSION N/A 12/03/2019  ? Procedure: CARDIOVERSION;  Surgeon: Acie Fredrickson Wonda Cheng, MD;  Location: [address];  Service:  Cardiovascular;  Laterality: N/A;  ? CATARACT EXTRACTION Right   ? CORONARY ARTERY BYPASS GRAFT  2001  ? "CABG X3" (05/14/2013)  ? DENTAL SURGERY    ? "multiple teeth removed; trmimed top of mouth so dentures would fit" (05/14/2013)  ? EYE SURGERY    ? cataracts  ? FEMORAL-POPLITEAL BYPASS GRAFT Right 04/25/2013  ? Procedure: RIGHT FEMORAL TO ABOVE KNEE POPLITEAL BYPASS GRAFT WITH RIGHT GREATER SAPHENOUS VEIN HARVEST; ULTRASOUND GUIDED;  Surgeon: Conrad Baltic, MD;  Location: [facility];  Service: Vascular;  Laterality: Right;  ? GUM SURGERY  1990's  ? "had bone scraped; had periodontal disease" (05/14/2013)  ? PALATE / UVULA BIOPSY / EXCISION  2003  ? Removed due to sleep apnea  ? TEE WITHOUT CARDIOVERSION N/A 06/13/2020  ? Procedure: TRANSESOPHAGEAL ECHOCARDIOGRAM (TEE);  Surgeon: Geralynn Rile, MD;  Location: [place];  Service: Cardiovascular;  Laterality: N/A;  ? TONSILLECTOMY    ? ? ?There were no vitals filed for this visit. ? ? Subjective Assessment - 09/22/21 1451   ? ? Subjective Pt reports he and his wife did the whole OTAGO booklet of supported exercises.   ? Pertinent History From medical history: Patient with mild dizziness that started about 2 months ago.  He has had more falls in February.   He states that all this came on gradually -  not very acute.  He states that the falls are related to the dizziness.  Most of the falls are backward except for the first time he fell in the trashcan.  He had about 5 falls in [employer] of March.   With the first, he was pushing the trash can and it didn't go and he fell in it.   One of the falls, he was climbing a stool to change a smoke alarm battery and looking upward and he fell off.  With one of the falls he was climbing up steps into the garage and he fell backward and his head hit the concrete.  With one, he was walking outside at church and just fell backward and his head on the concrete.  He didn't get hurt.  No LOC.  With the last fall, he was  outside and had a shovel and put it in the ground and just fell backward.  He doesn't have a feeling like he is getting pushed backward.  No diplopia.  He is dragging his feet per wife with ambulation.  No trouble swallowing food.    CT brain was ordered and completed March 22.  That was nonacute.  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  Carotid ultrasound was completed and done on March 7.  There is less than 50% stenosis bilaterally.  He followed up with his PA on March 21 and reported that episodes of dizziness and unstable gait had improved and he had had no further falls.   ? Diagnostic tests MRI scheduled,   CT brain was ordered and completed March 22.  That was nonacute.  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  Carotid ultrasound was completed and done on March 7.  There is less than 50% stenosis bilaterally.   ? Patient Stated Goals be steadier, don't fall, be able to stop using cane if possible.   ? Currently in Pain? No/denies   ? ?  ?  ? ?  ? ? ? ? ? ? ? ? ? ? ? ? ? ? ? ? ? ? ? ? [place] Adult PT Treatment/Exercise - 09/22/21 0001   ? ?  ? Exercises  ? Exercises Lumbar   ?  ? Lumbar Exercises: Aerobic  ? Nustep L5 x 6 min   ?  ? Lumbar Exercises: Standing  ? Functional Squats 10 reps   2 sets; cues for post WS  ?  ? Lumbar Exercises: Seated  ? Long [employer] on Chair Strengthening;Both;10 reps;Weights   ? LAQ on Chair Weights (lbs) 2   ? Other Seated Lumbar Exercises hip throws with yellow weighted ball 2 x 10   ? ?  ?  ? ?  ? ? ? ? ? ? Balance Exercises - 09/22/21 0001   ? ?  ? Balance Exercises: Standing  ? Tandem Stance Eyes open;Intermittent upper extremity support;3 reps;15 secs   alt LE x 3 each  ? Tandem Gait Forward;Upper extremity support;3 reps   along the counter  ? Retro Gait Upper extremity support;3 reps   along counter; cues to increase stride length  ? Other Standing Exercises fwd and sidestepping over 1/2 foam roll BLE x 10 each; fwd  steps over 1/2 foam roll and backward x 10   ? ?  ?  ? ?  ? ? ? ? ? ? ? PT Short Term Goals - 09/15/21 1511   ? ?  ? PT  SHORT TERM GOAL #1  ? Title Pt will complete DGI to further assess gait/balance   ? Time 2   ? Period Weeks   ? Status Achieved   ? Target Date 09/22/21   ? ?  ?  ? ?  ? ? ? ? PT Long Term Goals - 09/17/21 1822   ? ?  ? PT LONG TERM GOAL #1  ? Title Pt. will be independent with advanced HEP to improve LE strength and stability.   ? Time 8   ? Period Weeks   ? Status On-going   ? Target Date 11/03/21   ?  ? PT LONG TERM GOAL #2  ? Title Pt. will demonstrate decreased need for AD as demonstrated by Merrilee Jansky 50/56 or better.   ? Baseline 42/56 moderate risk for falls, indicates need for cane inside and outside.   ? Time 8   ? Period Weeks   ? Status On-going   ? Target Date 11/03/21   ?  ? PT LONG TERM GOAL #3  ? Title Pt. will demonstrate decreased risk of falls as demonstrated by DGI of >19/24   ? Baseline 12/24 - high risk for falls.   ? Time 8   ? Period Weeks   ? Status On-going   ? Target Date 11/03/21   ?  ? PT LONG TERM GOAL #4  ? Title Pt. will be able to walk over even and uneven surfaces/curbs and ramps safely without instability or AD.   ? Baseline difficulty on compliant surfaces, turns, also has frequent falls outdoors, on steps.   ? Time 8   ? Period Weeks   ? Status On-going   ? Target Date 11/03/21   ?  ? PT LONG TERM GOAL #5  ? Title Pt. will be able to maintain SLS x 10 sec bil to demonstrate improved hip strength and balance.   ? Baseline unable to maintain SLS, loses balance immediately without support.   ? Time 8   ? Period Weeks   ? Status On-going   ? Target Date 11/03/21   ? ?  ?  ? ?  ? ? ? ? ? ? ? ? Plan - 09/22/21 1530   ? ? Clinical Impression Statement Pt denies any new falls since the last visit. We were unable to progress OTAGO booklet, will plan to progress HEP next visit. Pt demonstrated more difficulty with being supported on L LE with stepping exercises and showed more  difficulty with tandem stance L LE in front. Cues required with retro gait to increase stride length. LOB x 2 with stepping exericses going fwd and lateral each time. CGA and min A required at times to recover bala

## 2021-09-24 ENCOUNTER — Ambulatory Visit: Payer: Medicare Other | Admitting: Physical Therapy

## 2021-09-24 ENCOUNTER — Encounter: Payer: Self-pay | Admitting: Physical Therapy

## 2021-09-24 ENCOUNTER — Ambulatory Visit (INDEPENDENT_AMBULATORY_CARE_PROVIDER_SITE_OTHER): Payer: Medicare Other | Admitting: Pharmacist

## 2021-09-24 DIAGNOSIS — R2681 Unsteadiness on feet: Secondary | ICD-10-CM | POA: Diagnosis not present

## 2021-09-24 DIAGNOSIS — R42 Dizziness and giddiness: Secondary | ICD-10-CM | POA: Diagnosis not present

## 2021-09-24 DIAGNOSIS — Z9181 History of falling: Secondary | ICD-10-CM | POA: Diagnosis not present

## 2021-09-24 DIAGNOSIS — R296 Repeated falls: Secondary | ICD-10-CM | POA: Diagnosis not present

## 2021-09-24 DIAGNOSIS — E782 Mixed hyperlipidemia: Secondary | ICD-10-CM

## 2021-09-24 DIAGNOSIS — E1151 Type 2 diabetes mellitus with diabetic peripheral angiopathy without gangrene: Secondary | ICD-10-CM

## 2021-09-24 DIAGNOSIS — I2510 Atherosclerotic heart disease of native coronary artery without angina pectoris: Secondary | ICD-10-CM

## 2021-09-24 DIAGNOSIS — R262 Difficulty in walking, not elsewhere classified: Secondary | ICD-10-CM

## 2021-09-24 DIAGNOSIS — M6281 Muscle weakness (generalized): Secondary | ICD-10-CM | POA: Diagnosis not present

## 2021-09-24 MED ORDER — TAMSULOSIN HCL 0.4 MG PO CAPS: 0.4000 mg | ORAL_CAPSULE | Freq: Every day | ORAL | 1 refills | Status: DC

## 2021-09-24 MED ORDER — LOSARTAN POTASSIUM 25 MG PO TABS: 25.0000 mg | ORAL_TABLET | Freq: Every day | ORAL | 1 refills | Status: DC

## 2021-09-24 NOTE — Chronic Care Management (AMB) (Signed)
? ? ?Chronic Care Management ?Pharmacy Note ? ?09/24/2021 ?Name:  Harry HUBBERT Sr. MRN:  [id number] DOB:  [birth year] ? ?Summary:  ?Received communication from [facility] patient assistance that additional information needed to process patient assistance program for 2023 for Harry Brown's Eliquis. Patient has brought in needed financial documentation but per [employer] patient assistance program they need new patient portion of application. Also reminded patient he has to spend 3% of household income out of pocket in 2023 to qualify. I have requested report from his pharmacy to review. Patient will stop by office today to sign patient portion of application.  ?Reviewed medications - getting 90 day supply of most maintenance medications except losartan and tamsulosin. Patient indicated he would prefer 90 day supply for all medications when able. Updated Rx's sent to [facility] for 90 days + 1 RF for Losartan and tamsulosin.  ?Patient mentioned that atenolol has bitter taste. Recommended he could take atenolol with a little peanut butter, nutella, applesause or yogurt to mask bitter taste ? ?Subjective: ?Harry Antunes. is an 81 y.o. year old male who is a primary patient of [name], MD.  The CCM team was consulted for assistance with disease management and care coordination needs.   ? ?Engaged with patient by telephone for follow up visit in response to provider referral for pharmacy case management and/or care coordination services.  ? ?Consent to Services:  ?The patient was given information about Chronic Care Management services, agreed to services, and gave verbal consent prior to initiation of services.  Please see initial visit note for detailed documentation.  ? ?Patient Care Team: ?[name], MD as PCP - General (Family Medicine) ?Conrad [place], MD as Consulting Physician (Vascular Surgery) ?Calvert Cantor, MD as Consulting Physician (Ophthalmology) ?Malachy Chamber, DDS as Consulting Physician  (Periodontics) ?Cherre Robins, RPH-CPP (Pharmacist) ? ?Recent office visits: ?08/25/2021 - Fam Med ([place]) seen for dizziness and unstable gait. Referred to neurology. no med changes noted.  ?08/10/2021 - Fam Med (Saguier, PAC) F/U chronic ocnditions. Labs ordered; carotid ultrasound and CT head ordered. No medication changes noted. ?04/13/2021 - Fam Med (Dr Charlett Blake) F/U chronic conditions. Referred to physcial therapy for unsteady gait. Labs ordered but not drawn. No med changes. Received annual flu vaccine.  ? ?Recent consult visits: ?09/24/2021 - Neurology (Dr Tat) Fall evaluation and neuropathy. Recommended OTC B12 supplementation 1059mg daily to get serum B12 to >400. ? ?Hospital visits: ?No hospitalizations in last 6 months ? ? ? ?Objective: ? ?Lab Results  ?Component Value Date  ? CREATININE 1.11 08/10/2021  ? CREATININE 1.08 11/07/2020  ? CREATININE 1.00 09/01/2020  ? ? ?Lab Results  ?Component Value Date  ? HGBA1C 6.7 (H) 09/01/2020  ? ?Last diabetic Eye exam:  ?Lab Results  ?Component Value Date/Time  ? HMDIABEYEEXA No Retinopathy 05/26/2018 12:00 AM  ?  ?Last diabetic Foot exam: No results found for: HMDIABFOOTEX  ? ?   ?Component Value Date/Time  ? CHOL 121 09/01/2020 1057  ? CHOL 122 09/08/2018 0904  ? TRIG 40.0 09/01/2020 1057  ? HDL 40.40 09/01/2020 1057  ? HDL 45 09/08/2018 0904  ? CHOLHDL 3 09/01/2020 1057  ? VLDL 8.0 09/01/2020 1057  ? L[place]73 09/01/2020 1057  ? L[place]67 04/03/2020 1044  ? ? ? ?  Latest Ref Rng & Units 08/10/2021  ?  4:33 PM 09/01/2020  ? 10:57 AM 06/18/2020  ? 11:39 AM  ?Hepatic Function  ?Total Protein 6.0 - 8.3 g/dL 7.1  6.5   6.7    ?Albumin 3.5 - 5.2 g/dL 4.1   4.1   3.9    ?AST 0 - 37 U/L 23   14   27     ?ALT 0 - 53 U/L 19   15   11     ?Alk Phosphatase 39 - 117 U/L 79   66   88    ?Total Bilirubin 0.2 - 1.2 mg/dL 0.6   0.7   0.5    ? ? ?Lab Results  ?Component Value Date/Time  ? TSH 4.47 09/01/2020 10:57 AM  ? TSH 3.70 04/03/2020 10:44 AM  ? FREET4 0.81 05/02/2018  10:53 AM  ? FREET4 1.08 12/16/2011 03:25 PM  ? ? ? ?  Latest Ref Rng & Units 08/10/2021  ?  4:33 PM 09/01/2020  ? 10:57 AM 06/18/2020  ? 11:39 AM  ?CBC  ?WBC 4.0 - 10.5 K/uL 8.8   7.2   11.9    ?Hemoglobin 13.0 - 17.0 g/dL 12.4   11.8   11.6    ?Hematocrit 39.0 - 52.0 % 36.8   36.2   36.0    ?Platelets 150.0 - 400.0 K/uL 228.0   190.0   272.0    ? ? ?No results found for: VD25OH ? ?Clinical ASCVD: Yes  ?The ASCVD Risk score (Arnett DK, et al., 2019) failed to calculate for the following reasons: ?  The 2019 ASCVD risk score is only valid for ages 12 to 25   ? ? ?Social History  ? ?Tobacco Use  ?Smoking Status Former  ? Packs/day: 1.50  ? Years: 50.00  ? Pack years: 75.00  ? Types: Cigarettes  ?Smokeless Tobacco Never  ?Tobacco Comments  ? 05/14/2013 "quit smoking in ~ 2004; still Uses Comit lozenges"  ? ?BP Readings from Last 3 Encounters:  ?09/04/21 123/74  ?08/25/21 (!) 118/56  ?08/10/21 116/62  ? ?Pulse Readings from Last 3 Encounters:  ?09/04/21 68  ?08/25/21 74  ?08/10/21 80  ? ?Wt Readings from Last 3 Encounters:  ?09/04/21 193 lb 9.6 oz (87.8 kg)  ?08/25/21 194 lb (88 kg)  ?08/10/21 195 lb 3.2 oz (88.5 kg)  ? ? ?Assessment: Review of patient past medical history, allergies, medications, health status, including review of consultants reports, laboratory and other test data, was performed as part of comprehensive evaluation and provision of chronic care management services.  ? ?SDOH:  (Social Determinants of Health) assessments and interventions performed:  ? ? ? ? ?[place] ? ?Allergies  ?Allergen Reactions  ? Adhesive [Tape] Rash  ? Latex Rash  ? Other Hives, Swelling and Rash  ?  Plastic shopping bags ?Metal staples: rash, swelling  ? ? ?Medications Reviewed Today   ? ? Reviewed by Cherre Robins, RPH-CPP (Pharmacist) on 09/24/21 at [phone]  Med List Status: <None>  ? ?Medication Order Taking? Sig Documenting Provider Last Dose Status Informant  ?atenolol (TENORMIN) 25 MG tablet [id number] Yes TAKE ONE (1) TABLET  BY MOUTH EVERY DAY [name], MD Taking Active   ?atorvastatin (LIPITOR) 40 MG tablet [id number] Yes TAKE ONE (1) TABLET BY MOUTH EACH DAY AT 6PM *DUE FOR APPOINTMENT AND LABS IN MARCH* [name], MD Taking Active   ?Cholecalciferol (VITAMIN D) 50 MCG (2000 UT) tablet [id number] Yes Take 2,000 Units by mouth daily.  [provider] Taking Active Self  ?ELIQUIS 5 MG TABS tablet [id number] Yes TAKE ONE (1) TABLET BY MOUTH TWO (2) TIMES DAILY [name], MD Taking Active   ?  latanoprost (XALATAN) 0.005 % ophthalmic solution [id number] Yes Place 1 drop into both eyes at bedtime.  [provider] Taking Active Self  ?         ?Med Note Kenton Kingfisher, ABBIE R   Mon Feb 26, 2019  3:04 PM)    ?levothyroxine (SYNTHROID) 75 MCG tablet [id number] Yes TAKE ONE (1) TABLET BY MOUTH EVERY DAY [name], MD Taking Active   ?loratadine (CLARITIN) 10 MG tablet [id number] Yes Take 10 mg by mouth daily as needed for allergies. [provider] Taking Active Self  ?losartan (COZAAR) 25 MG tablet [id number] Yes TAKE ONE (1) TABLET BY MOUTH EVERY DAY Crenshaw, Denice Bors, MD Taking Active   ?metFORMIN (GLUCOPHAGE-XR) 500 MG 24 hr tablet [id number] Yes TAKE TWO (2) TABLETS BY MOUTH EVERY MORNING WITH BREAKFAST *DUE FOR APPOINTMENT AND LABS IN [place]* Charlett Blake, Bonnita Levan, MD Taking Active   ?nitroGLYCERIN (NITROSTAT) 0.4 MG SL tablet [id number]  Place 1 tablet (0.4 mg total) under the tongue every 5 (five) minutes as needed for chest pain. Jenean Lindau, MD  Expired 02/06/20 2359 Self  ?ONETOUCH DELICA LANCETS 99J MISC [id number] Yes USE TO CHECK BLOOD SUGAR ONCE DAILY [name], MD Taking Active Self  ?ONETOUCH ULTRA test strip [id number] Yes USE 1 STRIP DAILY [name], MD Taking Active   ?tamsulosin (FLOMAX) 0.4 MG CAPS capsule [id number] Yes TAKE ONE CAPSULE BY MOUTH DAILY [name], MD Taking Active   ?vitamin B-12 (CYANOCOBALAMIN) 1000 MCG tablet [id number] Yes Take 1,000 mcg by mouth daily.  [provider] Taking Active Self  ? ?  ?  ? ?  ? ? ?Patient Active Problem List  ? Diagnosis Date Noted  ? Unsteady gait when walking 09/01/2020  ? Allergies 09/01/2020  ? SOM (secretory otitis media),

## 2021-09-24 NOTE — Therapy (Addendum)
PHYSICAL THERAPY DISCHARGE SUMMARY ? ?Visits from Start of Care: 5 ? ?Current functional level related to goals / functional outcomes: ?Improved LE strength  ?  ?Remaining deficits: ?Decreased LE strength and endurance, impaired balance, falls  ?  ?Education / Equipment: ?HEP including OTAGO exercise program.   ?Plan: ?Patient agrees to discharge.  Patient goals were not met. Patient self-discharged due to feeling he no longer needed therapy and cost of copays, cancelled all remaining visits.    ? ?Rennie Natter, PT, DPT 10/05/2021 11:47AM ? ? ?[facility] ?[address] ?[place], [place], [address] ?Phone: [phone]   Fax:  [phone] ? ?Physical Therapy Treatment ? ?Patient Details  ?Name: Harry PAULSON Sr. ?MRN: [id number] ?Date of Birth: June 11, 1940 ?Referring Provider (PT): Saguier, Iris Pert ? ? ?Encounter Date: 09/24/2021 ? ? PT End of Session - 09/24/21 1455   ? ? Visit Number 5   ? Number of Visits 16   ? Date for PT Re-Evaluation 11/03/21   ? Authorization Type [employer] Medicare   ? PT Start Time 5852   ? PT Stop Time 1530   ? PT Time Calculation (min) 41 min   ? Activity Tolerance Patient tolerated treatment well   ? Behavior During Therapy [facility] for tasks assessed/performed   ? ?  ?  ? ?  ? ? ?Past Medical History:  ?Diagnosis Date  ? Anemia 09/24/2013  ? CAD (coronary artery disease)   ? Cellulitis 05/14/2013  ? RLE  ? Diverticulitis   ? ED (erectile dysfunction)   ? Glaucoma 08/18/2014  ? History of sleep apnea   ? had surgery to correct  ? Hypertension   ? Hypothyroidism   ? Medicare annual wellness visit, subsequent 04/13/2013  ? Myocardial infarction ([place]) 11/05/1985  ? Nocturia 03/15/2017  ? Other and unspecified hyperlipidemia   ? Penile lesion 02/24/2015  ? Peripheral neuropathy 03/31/2012  ? Peripheral vascular disease ([place])   ? Postoperative anemia due to acute blood loss 09/24/2013  ? Preventative health care 03/13/2016  ? Sleep apnea  09/16/2017  ? Type II diabetes mellitus ([place])   ? fasting 100-120  ? Urinary urgency 11/22/2012  ? ? ?Past Surgical History:  ?Procedure Laterality Date  ? ABDOMINAL AORTAGRAM  Oct. 9, 2014  ? Dr. Bridgett Larsson  ? ABDOMINAL AORTAGRAM N/A 03/15/2013  ? Procedure: ABDOMINAL AORTAGRAM;  Surgeon: Conrad Washita, MD;  Location: [facility] CATH [facility];  Service: Cardiovascular;  Laterality: N/A;  ? ANTERIOR CERVICAL DECOMP/DISCECTOMY FUSION  ~ 2000  ? CARDIOVERSION N/A 12/03/2019  ? Procedure: CARDIOVERSION;  Surgeon: Acie Fredrickson Wonda Cheng, MD;  Location: [facility];  Service: Cardiovascular;  Laterality: N/A;  ? CATARACT EXTRACTION Right   ? CORONARY ARTERY BYPASS GRAFT  2001  ? "CABG X3" (05/14/2013)  ? DENTAL SURGERY    ? "multiple teeth removed; trmimed top of mouth so dentures would fit" (05/14/2013)  ? EYE SURGERY    ? cataracts  ? FEMORAL-POPLITEAL BYPASS GRAFT Right 04/25/2013  ? Procedure: RIGHT FEMORAL TO ABOVE KNEE POPLITEAL BYPASS GRAFT WITH RIGHT GREATER SAPHENOUS VEIN HARVEST; ULTRASOUND GUIDED;  Surgeon: Conrad Snyder, MD;  Location: [facility] OR;  Service: Vascular;  Laterality: Right;  ? GUM SURGERY  1990's  ? "had bone scraped; had periodontal disease" (05/14/2013)  ? PALATE / UVULA BIOPSY / EXCISION  2003  ? Removed due to sleep apnea  ? TEE WITHOUT CARDIOVERSION N/A 06/13/2020  ? Procedure: TRANSESOPHAGEAL ECHOCARDIOGRAM (TEE);  Surgeon:  Geralynn Rile, MD;  Location: [facility];  Service: Cardiovascular;  Laterality: N/A;  ? TONSILLECTOMY    ? ? ?There were no vitals filed for this visit. ? ? Subjective Assessment - 09/24/21 1454   ? ? Subjective Pt reports good compliance with HEP, denies new falls or pain.   ? Pertinent History From medical history: Patient with mild dizziness that started about 2 months ago.  He has had more falls in February.   He states that all this came on gradually - not very acute.  He states that the falls are related to the dizziness.  Most of the falls are backward except for the first time he fell in the  trashcan.  He had about 5 falls in [place] of March.   With the first, he was pushing the trash can and it didn't go and he fell in it.   One of the falls, he was climbing a stool to change a smoke alarm battery and looking upward and he fell off.  With one of the falls he was climbing up steps into the garage and he fell backward and his head hit the concrete.  With one, he was walking outside at church and just fell backward and his head on the concrete.  He didn't get hurt.  No LOC.  With the last fall, he was outside and had a shovel and put it in the ground and just fell backward.  He doesn't have a feeling like he is getting pushed backward.  No diplopia.  He is dragging his feet per wife with ambulation.  No trouble swallowing food.    CT brain was ordered and completed March 22.  That was nonacute.  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  Carotid ultrasound was completed and done on March 7.  There is less than 50% stenosis bilaterally.  He followed up with his PA on March 21 and reported that episodes of dizziness and unstable gait had improved and he had had no further falls.   ? Diagnostic tests MRI scheduled,   CT brain was ordered and completed March 22.  That was nonacute.  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  Carotid ultrasound was completed and done on March 7.  There is less than 50% stenosis bilaterally.   ? Patient Stated Goals be steadier, don't fall, be able to stop using cane if possible.   ? Currently in Pain? No/denies   ? ?  ?  ? ?  ? ? ? ? ? ? ? ? ? ? ? ? ? ? ? ? ? ? ? ? [place] Adult PT Treatment/Exercise - 09/24/21 0001   ? ?  ? Exercises  ? Exercises Knee/Hip   ?  ? Lumbar Exercises: Aerobic  ? Nustep L5 x 6 min   ?  ? Knee/Hip Exercises: Standing  ? Heel Raises Both;15 reps   ? Heel Raises Limitations heel/toe raises counter support.   ? Hip Abduction Stengthening;Right;Left;2 sets;10 reps   ? Abduction Limitations  counter support   ? Hip Extension Stengthening;Right;Left;2 sets;10 reps   ? Extension Limitations counter support   ? Forward Step Up Right;Left;2 sets;10 reps;Hand Hold: 2;Step Height: 4"   ? Forward Step Up Limitations more challenged on left   ? Functional Squat 10 reps   ?  ? Knee/Hip Exercises: Seated  ? Sit to Sand 10 reps;without UE support   2 x 5 with  just tap  ? ?  ?  ? ?  ? ? ? ? ? ? Balance Exercises - 09/24/21 0001   ? ?  ? Balance Exercises: Standing  ? Standing Eyes Opened Narrow base of support (BOS);Head turns;Foam/compliant surface;Limitations   ? Standing Eyes Opened Limitations in corner for safety, feet apart on airex eyes open x 30 sec, head nods x 10, head turns x 10.  SBA for safety.   ? Standing Eyes Closed Head turns;Foam/compliant surface;Limitations   ? Standing Eyes Closed Limitations in corner for safety, on airex with eyes closed 3 x 30 sec - very large sway, frequent touches to wall and by PT to stabilize needed.  On solid surface, eyes closed x 30 sec, head nods x 10, head turns x 10, minimal sway.   ? Tandem Stance Eyes open;2 reps;15 secs;Limitations   ? Tandem Stance Time in corner for safety without UE support.   ? ?  ?  ? ?  ? ? ? ? ? ? ? PT Short Term Goals - 09/15/21 1511   ? ?  ? PT SHORT TERM GOAL #1  ? Title Pt will complete DGI to further assess gait/balance   ? Time 2   ? Period Weeks   ? Status Achieved   ? Target Date 09/22/21   ? ?  ?  ? ?  ? ? ? ? PT Long Term Goals - 09/17/21 1822   ? ?  ? PT LONG TERM GOAL #1  ? Title Pt. will be independent with advanced HEP to improve LE strength and stability.   ? Time 8   ? Period Weeks   ? Status On-going   ? Target Date 11/03/21   ?  ? PT LONG TERM GOAL #2  ? Title Pt. will demonstrate decreased need for AD as demonstrated by Merrilee Jansky 50/56 or better.   ? Baseline 42/56 moderate risk for falls, indicates need for cane inside and outside.   ? Time 8   ? Period Weeks   ? Status On-going   ? Target Date 11/03/21   ?  ? PT LONG TERM  GOAL #3  ? Title Pt. will demonstrate decreased risk of falls as demonstrated by DGI of >19/24   ? Baseline 12/24 - high risk for falls.   ? Time 8   ? Period Weeks   ? Status On-going   ? Target Date 05/30/

## 2021-09-24 NOTE — Patient Instructions (Signed)
Harry Brown,  ?It was a pleasure speaking with you  ?Below is a summary of your health goals and care plan ? ? ?Patient Goals/Self-Care Activities ?take medications as prescribed,  ?check glucose daily, document, and provide at future appointments,  ?check blood pressure once a week, document, and provide at future appointments,  ?Come by office to sign Eliquis application; collaborate with provider on medication access solutions, and increase exercise as able ?Try taking atenolol with a little peanut butter, nutella, applesause or yogurt to mask bitter taste ? ? ?If you have any questions or concerns, please feel free to contact me either at the phone number below or with a MyChart message.  ? ?Keep up the good work! ? ?Harry Brown, PharmD ?Clinical Pharmacist ?[facility] ?[facility] ?[phone] (direct line)  ?[phone] (main office number) ? ?Chronic Care Management Care Plan ? ?Hypertension ?BP Readings from Last 3 Encounters:  ?09/04/21 123/74  ?08/25/21 (!) 118/56  ?08/10/21 116/62  ? ?Pharmacist Clinical Goal(s): ?Over the next 90 days, patient will work with PharmD and providers to maintain BP goal <130/80 ?Current regimen:  ?Atenolol 25mg  daily  ?Losartan 25mg  daily ?Interventions: ?Reviewed goal ?Requested patient to check blood pressure once weekly and when symptomptic and record ?Patient self care activities - Over the next 90 days, patient will: ?Check blood pressure once per week, document, and provide at future appointments ?Ensure daily salt intake < 2300 mg/day ? ?Hyperlipidemia/Atherosclerosis/PVD ?Lab Results  ?Component Value Date/Time  ? LDLCALC 73 09/01/2020 10:57 AM  ? LDLCALC 67 04/03/2020 10:44 AM  ? ?Pharmacist Clinical Goal(s): ?Over the next 90 days, patient will work with PharmD and providers to achieve LDL goal < 70 ?Current regimen:  ?Atorvastatin 40mg  daily ?Interventions: ?Discussed diet and exercise ?Discussed LDL goal ?Patient self care activities - Over  the next 90 days, patient will: ?Encourage you to be as active as possible - continue with physical therapy ?Maintain cholesterol medication regimen.  ? ?Diabetes ?Lab Results  ?Component Value Date/Time  ? HGBA1C 6.7 (H) 09/01/2020 10:57 AM  ? HGBA1C 6.5 (H) 04/03/2020 10:44 AM  ? ?Pharmacist Clinical Goal(s): ?Over the next 90 days, patient will work with PharmD and providers to maintain A1c goal <7% ?Current regimen:  ?Metformin XR 500mg  - take 2 tablets = 1000mg  each morning ?Interventions: ?Discussed diet and exercise ?Reviewed home blood glucose readings and reviewed goals  ?Fasting blood glucose goal (before meals) = 80 to 130 ?Blood glucose goal after a meal = less than 180  ?Patient self care activities - Over the next 90 days, patient will: ?Check blood sugar once daily, document, and provide at future appointments ?Contact provider with any episodes of hypoglycemia ? ?Artial fibrillation ?Pharmacist Clinical Goal(s) ?Over the next 90 days, patient will work with PharmD and providers to reduce risk of stroke associated with Atrial fibrillaiton and reduce barrier to obtaining medication ?Current regimen:  ?Eliquis 5mg  twice daily ?Atenolol 25mg  daily  ?Interventions: ?Confirmed with Harry Brown patient assistance program that patient was approved for Eliquis 05/26/2021 thru 06/06/2021 - he will only get 1 shipment but that will help delay time to coverage gap in 2023.  ?Patient self care activities - Over the next 90 days, patient will: ?Continue Eliquis and atenolol.  ?Please call the [place] patient assistance program at [phone] to confirm you shipping address for Eliquis. ? ?Medication management ?Pharmacist Clinical Goal(s): ?Over the next 90 days, patient will work with PharmD and providers to maintain optimal medication  adherence ?Current pharmacy: [facility] ?Interventions ?Comprehensive medication review performed. ?Continue current medication management  strategy ?Recommended he could take atenolol with a little peanut butter, nutella, applesause or yogurt to mask bitter taste ?Patient self care activities - Over the next 90 days, patient will: ?Focus on medication adherence by filling and taking medications appropriately ?Take medications as prescribed ?Report any questions or concerns to PharmD and/or provider(s) ? ? ?Patient verbalizes understanding of instructions and care plan provided today and agrees to view in [place]. Active MyChart status confirmed with patient.    ?

## 2021-09-24 NOTE — Telephone Encounter (Signed)
Addressed at Chronic Care Management appointment 09/24/2021 - see notes.  ?

## 2021-09-29 ENCOUNTER — Encounter: Payer: Medicare Other | Admitting: Physical Therapy

## 2021-10-01 ENCOUNTER — Ambulatory Visit: Payer: Medicare Other

## 2021-10-04 DIAGNOSIS — E782 Mixed hyperlipidemia: Secondary | ICD-10-CM | POA: Diagnosis not present

## 2021-10-04 DIAGNOSIS — I700 Atherosclerosis of aorta: Secondary | ICD-10-CM | POA: Diagnosis not present

## 2021-10-04 DIAGNOSIS — Z7984 Long term (current) use of oral hypoglycemic drugs: Secondary | ICD-10-CM | POA: Diagnosis not present

## 2021-10-04 DIAGNOSIS — I2510 Atherosclerotic heart disease of native coronary artery without angina pectoris: Secondary | ICD-10-CM | POA: Diagnosis not present

## 2021-10-04 DIAGNOSIS — Z87891 Personal history of nicotine dependence: Secondary | ICD-10-CM | POA: Diagnosis not present

## 2021-10-04 DIAGNOSIS — E1151 Type 2 diabetes mellitus with diabetic peripheral angiopathy without gangrene: Secondary | ICD-10-CM | POA: Diagnosis not present

## 2021-10-06 ENCOUNTER — Ambulatory Visit: Payer: Medicare Other | Admitting: Physical Therapy

## 2021-10-08 ENCOUNTER — Ambulatory Visit: Payer: Medicare Other

## 2021-10-12 ENCOUNTER — Telehealth: Payer: Self-pay | Admitting: Pharmacist

## 2021-10-12 NOTE — Telephone Encounter (Signed)
Patient called to check on Eliquis medication assistance program. Patient has not met required out of pocket spend for 2023 yet. Per pharmacy records has only spend about $30 so far. He has run out of medication assistance program Eliquis and will start filling month at $47 copay. He could reach coverage gap later in 2023 or may not hit gap this year.  ?Expained above to patient.  ? ?

## 2021-10-13 ENCOUNTER — Encounter: Payer: Medicare Other | Admitting: Physical Therapy

## 2021-10-22 ENCOUNTER — Other Ambulatory Visit: Payer: Self-pay | Admitting: Family Medicine

## 2021-10-26 ENCOUNTER — Other Ambulatory Visit: Payer: Self-pay | Admitting: Family Medicine

## 2021-11-03 ENCOUNTER — Other Ambulatory Visit: Payer: Self-pay | Admitting: Family Medicine

## 2021-11-06 DIAGNOSIS — H26492 Other secondary cataract, left eye: Secondary | ICD-10-CM | POA: Diagnosis not present

## 2021-11-06 DIAGNOSIS — H401231 Low-tension glaucoma, bilateral, mild stage: Secondary | ICD-10-CM | POA: Diagnosis not present

## 2021-11-06 DIAGNOSIS — H04123 Dry eye syndrome of bilateral lacrimal glands: Secondary | ICD-10-CM | POA: Diagnosis not present

## 2021-11-06 DIAGNOSIS — E119 Type 2 diabetes mellitus without complications: Secondary | ICD-10-CM | POA: Diagnosis not present

## 2021-11-06 LAB — HM DIABETES EYE EXAM

## 2021-11-10 DIAGNOSIS — M9903 Segmental and somatic dysfunction of lumbar region: Secondary | ICD-10-CM | POA: Diagnosis not present

## 2021-11-10 DIAGNOSIS — M545 Low back pain, unspecified: Secondary | ICD-10-CM | POA: Diagnosis not present

## 2021-11-12 ENCOUNTER — Telehealth: Payer: Medicare Other

## 2021-11-25 ENCOUNTER — Telehealth: Payer: Self-pay | Admitting: Family

## 2021-11-25 ENCOUNTER — Other Ambulatory Visit: Payer: Self-pay

## 2021-11-25 ENCOUNTER — Telehealth: Payer: Self-pay | Admitting: Family Medicine

## 2021-11-25 DIAGNOSIS — Z23 Encounter for immunization: Secondary | ICD-10-CM

## 2021-11-25 MED ORDER — ZOSTER VAC RECOMB ADJUVANTED 50 MCG/0.5ML IM SUSR: 0.5000 mL | Freq: Once | INTRAMUSCULAR | 1 refills | Status: DC

## 2021-11-25 MED ORDER — ZOSTER VAC RECOMB ADJUVANTED 50 MCG/0.5ML IM SUSR: 0.5000 mL | Freq: Once | INTRAMUSCULAR | 1 refills | Status: AC

## 2021-11-25 NOTE — Telephone Encounter (Signed)
Wife is requesting rx for shingrix for pt.

## 2021-11-25 NOTE — Telephone Encounter (Signed)
Pt came in office stating is needing a rx for shinglex shot to be done at [facility]. Please advise. Pt tel [phone].

## 2021-11-25 NOTE — Telephone Encounter (Signed)
Wife requesting rx for shingrix sent for patient.

## 2021-11-25 NOTE — Telephone Encounter (Signed)
Shingrix rx sent to [facility].

## 2021-11-26 ENCOUNTER — Telehealth: Payer: Self-pay | Admitting: Pharmacist

## 2021-11-26 NOTE — Telephone Encounter (Signed)
Charlett Nose (spouse) called stating that pt's 7.7.23 telephone call with Tammy needed to be [place] service. Cancelled appt and advised her that Tammy may be giving the pt a call later to set up an appt on a different day.

## 2021-12-04 NOTE — Telephone Encounter (Signed)
Spoke to patient and his wife - appointment rescheduled for 12/16/2021 at 1pm

## 2021-12-11 ENCOUNTER — Telehealth: Payer: Medicare Other

## 2021-12-16 ENCOUNTER — Other Ambulatory Visit (INDEPENDENT_AMBULATORY_CARE_PROVIDER_SITE_OTHER): Payer: Medicare Other

## 2021-12-16 ENCOUNTER — Ambulatory Visit (INDEPENDENT_AMBULATORY_CARE_PROVIDER_SITE_OTHER): Payer: Medicare Other | Admitting: Pharmacist

## 2021-12-16 DIAGNOSIS — E039 Hypothyroidism, unspecified: Secondary | ICD-10-CM | POA: Diagnosis not present

## 2021-12-16 DIAGNOSIS — I700 Atherosclerosis of aorta: Secondary | ICD-10-CM | POA: Diagnosis not present

## 2021-12-16 DIAGNOSIS — E1151 Type 2 diabetes mellitus with diabetic peripheral angiopathy without gangrene: Secondary | ICD-10-CM | POA: Diagnosis not present

## 2021-12-16 DIAGNOSIS — E782 Mixed hyperlipidemia: Secondary | ICD-10-CM | POA: Diagnosis not present

## 2021-12-16 NOTE — Chronic Care Management (AMB) (Signed)
Chronic Care Management Pharmacy Note  12/17/2021 Name:  Harry Brown Sr. MRN:  [id number] DOB:  07-Jul-1940  Summary:  Patient reports fatigue. He is taking levothyroxine 75mg daily. Rarely misses doses. He is taking in the morning on an empty stomach. Last TSH was WNL on 09/01/2020.  Reports fatigue in legs as well when walking from parking lot to car. Asking for handicap placard. Patient has documented PAD.  He also reports having diarrhea daily. Usually after evening meal. He is taking metformin ER 5053mtwice a day. Last A1c was 6.7%. Home blood glucose has been at goal.  Patient has filled out paperwork for Eliquis patient assistance however their patient assistance program requires that Medicare patients be in coverage gap and spend 3% of income out of pocket prior to being enrolled in patient assistance program. Patient has not reached coverage gap yet.   Plan Ordered TSH and A1c - both were at goal.  Will reach out to PCP regarding follow up and fatigue.  Also started paperwork for Handicap placard and forwarded to PCP   Subjective: Harry Angstr. is an [age].o. year old male who is a primary patient of Harry Brown.  The CCM team was consulted for assistance with disease management and care coordination needs.    Engaged with patient by telephone for follow up visit in response to provider referral for pharmacy case management and/or care coordination services.   Consent to Services:  The patient was given information about Chronic Care Management services, agreed to services, and gave verbal consent prior to initiation of services.  Please see initial visit note for detailed documentation.   Patient Care Team: Harry Brown as PCP - General (Family Medicine) ChConrad BurlingtonMD as Consulting Physician (Vascular Surgery) DiCalvert CantorMD as Consulting Physician (Ophthalmology) GeMalachy ChamberDDS as Consulting Physician (Periodontics) EcCherre Brown RPH-CPP (Pharmacist)  Recent office visits: [facility]. Referred to neurology. no med changes noted.  08/10/2021 - Fam Med ([place], PAC) F/U chronic ocnditions. Labs ordered; carotid ultrasound and CT head ordered. No medication changes noted. 04/13/2021 - Fam Med (Dr BlCharlett BlakeF/U chronic conditions. Referred to physcial therapy for unsteady gait. Labs ordered but not drawn. No med changes. Received annual flu vaccine.   Recent consult visits: 09/24/2021 - Neurology (Dr Tat) Fall evaluation and neuropathy. Recommended OTC B12 supplementation 100068mdaily to get serum B12 to >400.  Hospital visits: No hospitalizations in last 6 months    Objective:  Lab Results  Component Value Date   CREATININE 1.11 08/10/2021   CREATININE 1.08 11/07/2020   CREATININE 1.00 09/01/2020    Lab Results  Component Value Date   HGBA1C 6.8 (H) 12/16/2021   Last diabetic Eye exam:  Lab Results  Component Value Date/Time   HMDIABEYEEXA No Retinopathy 11/06/2021 12:00 AM    Last diabetic Foot exam: No results found for: "HMDIABFOOTEX"      Component Value Date/Time   CHOL 128 12/16/2021 1356   CHOL 122 09/08/2018 0904   TRIG 63.0 12/16/2021 1356   HDL 40.20 12/16/2021 1356   HDL 45 09/08/2018 0904   CHOLHDL 3 12/16/2021 1356   VLDL 12.6 12/16/2021 1356   LDLCALC 76 12/16/2021 1356   LDLCALC 67 04/03/2020 1044   LDLDIRECT 77.0 12/16/2021 1356       Latest Ref Rng & Units 08/10/2021    4:33 PM 09/01/2020   10:57  AM 06/18/2020   11:39 AM  Hepatic Function  Total Protein 6.0 - 8.3 g/dL 7.1  6.5  6.7   Albumin 3.5 - 5.2 g/dL 4.1  4.1  3.9   AST 0 - 37 U/L 23  14  27    ALT 0 - 53 U/L 19  15  11    Alk Phosphatase 39 - 117 U/L 79  66  88   Total Bilirubin 0.2 - 1.2 mg/dL 0.6  0.7  0.5     Lab Results  Component Value Date/Time   TSH 3.98 12/16/2021 01:56 PM   TSH 4.47 09/01/2020 10:57 AM   FREET4 0.81 05/02/2018 10:53 AM    FREET4 1.08 12/16/2011 03:25 PM       Latest Ref Rng & Units 08/10/2021    4:33 PM 09/01/2020   10:57 AM 06/18/2020   11:39 AM  CBC  WBC 4.0 - 10.5 K/uL 8.8  7.2  11.9   Hemoglobin 13.0 - 17.0 g/dL 12.4  11.8  11.6   Hematocrit 39.0 - 52.0 % 36.8  36.2  36.0   Platelets 150.0 - 400.0 K/uL 228.0  190.0  272.0     No results found for: "VD25OH"  Clinical ASCVD: Yes  The ASCVD Risk score (Arnett DK, et al., 2019) failed to calculate for the following reasons:   The 2019 ASCVD risk score is only valid for ages 21 to 69     Social History   Tobacco Use  Smoking Status Former   Packs/day: 1.50   Years: 50.00   Total pack years: 75.00   Types: Cigarettes  Smokeless Tobacco Never  Tobacco Comments   05/14/2013 "quit smoking in ~ 2004; still Uses Comit lozenges"   BP Readings from Last 3 Encounters:  09/04/21 123/74  08/25/21 (!) 118/56  08/10/21 116/62   Pulse Readings from Last 3 Encounters:  09/04/21 68  08/25/21 74  08/10/21 80   Wt Readings from Last 3 Encounters:  09/04/21 193 lb 9.6 oz (87.8 kg)  08/25/21 194 lb (88 kg)  08/10/21 195 lb 3.2 oz (88.5 kg)    Assessment: Review of patient past medical history, allergies, medications, health status, including review of consultants reports, laboratory and other test data, was performed as part of comprehensive evaluation and provision of chronic care management services.   SDOH:  (Social Determinants of Health) assessments and interventions performed:      CCM Care Plan  Allergies  Allergen Reactions   Adhesive [Tape] Rash   Latex Rash   Other Hives, Swelling and Rash    Plastic shopping bags Metal staples: rash, swelling    Medications Reviewed Today     Reviewed by Harry Brown, RPH-CPP (Pharmacist) on 12/16/21 at 53  Med List Status: <None>   Medication Order Taking? Sig Documenting Provider Last Dose Status Informant  atenolol (TENORMIN) 25 MG tablet [id number] Yes TAKE ONE (1) TABLET BY MOUTH EVERY  DAY Harry Lukes, MD Taking Active   atorvastatin (LIPITOR) 40 MG tablet [id number] Yes TAKE ONE (1) TABLET BY MOUTH EACH DAY AT6PM Harry Lukes, MD Taking Active   Cholecalciferol (VITAMIN D) 50 MCG (2000 UT) tablet [id number] Yes Take 2,000 Units by mouth daily.  [provider] Taking Active Self  ELIQUIS 5 MG TABS tablet [id number] Yes TAKE ONE (1) TABLET BY MOUTH TWO (2) TIMES DAILY Harry Lukes, MD Taking Active   latanoprost (XALATAN) 0.005 % ophthalmic solution [id number] Yes Place 1 drop into both eyes at bedtime.  [provider] Taking Active Self           Med Note Kenton Kingfisher, ABBIE R   Mon Feb 26, 2019  3:04 PM)    levothyroxine (SYNTHROID) 75 MCG tablet [id number] Yes Take 1 tablet (75 mcg total) by mouth daily before breakfast. Harry Lukes, MD Taking Active   loratadine (CLARITIN) 10 MG tablet [id number] Yes Take 10 mg by mouth daily as needed for allergies. [provider] Taking Active Self  losartan (COZAAR) 25 MG tablet [id number] Yes Take 1 tablet (25 mg total) by mouth daily. Harry Lukes, MD Taking Active   metFORMIN (GLUCOPHAGE-XR) 500 MG 24 hr tablet [id number] Yes TAKE TWO (2) TABLETS BY MOUTH EVERY MORNING WITH BREAKFAST Harry Lukes, MD Taking Active   nitroGLYCERIN (NITROSTAT) 0.4 MG SL tablet [id number]  Place 1 tablet (0.4 mg total) under the tongue every 5 (five) minutes as needed for chest pain. Jenean Lindau, MD  Expired 02/06/20 2359 Self  Jonetta Speak LANCETS 09T MISC [id number] Yes USE TO CHECK BLOOD SUGAR ONCE DAILY Harry Lukes, MD Taking Active Self  [facility] ULTRA test strip [id number] Yes USE 1 STRIP DAILY Harry Lukes, MD Taking Active   tamsulosin [facility]) 0.4 MG CAPS capsule [id number] Yes Take 1 capsule (0.4 mg total) by mouth daily. Harry Lukes, MD Taking Active   vitamin B-12 (CYANOCOBALAMIN) 1000 MCG tablet [id number] Yes Take 1,000 mcg by mouth daily. [provider] Taking Active Self             Patient Active Problem List   Diagnosis Date Noted   Unsteady gait when walking 09/01/2020   Allergies 09/01/2020   SOM (secretory otitis media), bilateral 09/01/2020   Encounter for therapeutic drug monitoring 07/01/2020   Paresthesia of right lower extremity 04/03/2020   Urinary frequency 11/19/2019   Atrial fibrillation ([place]) 11/19/2019   Bilateral shoulder pain 10/31/2018   Educated about COVID-19 virus infection 10/31/2018   Tremor of right hand 05/02/2018   Sleep apnea 09/16/2017   Nocturia 03/15/2017   Hypertension    Preventative health care 03/13/2016   Glaucoma 08/18/2014   Need for viral immunization 09/24/2013   Anemia 09/24/2013   Lower back pain 05/14/2013   Ambulatory dysfunction 05/14/2013   Benign prostatic hyperplasia 05/14/2013   Medicare annual wellness visit, subsequent 04/13/2013   Abdominal aortic aneurysm ([place]) 04/03/2013   Atherosclerosis of native arteries of extremity with intermittent claudication ([place]) 02/23/2013   Onychomycosis 11/27/2012   PVD (peripheral vascular disease) ([place]) 11/27/2012   Neck pain on left side 11/22/2012   Peripheral neuropathy 03/31/2012   Bruit 12/29/2011   Hypothyroidism 12/16/2011   Type 2 diabetes mellitus with atherosclerosis of aorta ([place]) 12/16/2011   Hyperlipidemia 12/16/2011   Diverticulosis 12/16/2011    Immunization History  Administered Date(s) Administered   Fluad Quad(high Dose 65+) 04/20/2019, 04/03/2020, 04/13/2021   Influenza Split 03/31/2012   Influenza, High Dose Seasonal PF 03/20/2013, 02/24/2015, 03/11/2016, 03/15/2017, 05/02/2018   Influenza,inj,Quad PF,6+ Mos 01/29/2014   Influenza,trivalent, recombinat, inj, PF 04/06/2011   PFIZER(Purple Top)SARS-COV-2 Vaccination 06/27/2019, 07/18/2019, 03/14/2020   Pfizer Covid-19 Vaccine Bivalent Booster 86yr & up 04/13/2021   Pneumococcal Conjugate-13 03/20/2013   Pneumococcal Polysaccharide-23 10/11/2004, 02/24/2015   Td 10/12/2003, 06/07/2009     Conditions to be addressed/monitored: Atrial Fibrillation, CAD, HTN, HLD, DMII and BPH; unsteady gait; OSA; hypothyroidism; PVD; AAA  Care Plan : General Pharmacy (Adult)  Updates made by ECherre Brown RPH-CPP since 12/22/2021 12:00 AM  Problem: Chronic Disease Management support, education, and care coordination needs related to Hypertension, Hyperlipidemia/Atherosclerosis/PVD, Diabetes, Afib, Hypothyroidism, Allergic Rhinitis, BPH, Glaucoma   Priority: High  Onset Date: 10/07/2020  Note:   Current Barriers:  Unable to independently afford treatment regimen Unable to maintain control of LDL at goal of <70 Chronic Disease Management support, education, and care coordination needs related to Hypertension, Hyperlipidemia/Atherosclerosis/PVD, Diabetes, Afib, Hypothyroidism, Allergic Rhinitis, BPH, Glaucoma  Pharmacist Clinical Goal(s):  Over the next 180 days, patient will achieve control of hyperlipidemia as evidenced by LDL <70 maintain control of BP and diabetes as evidenced by BP <130/80 and A1c <7.0%  Apply for patient assistance for Eliquis 66m  through collaboration with PharmD and provider.   Interventions: 1:1 collaboration with BMosie Lukes MD regarding development and update of comprehensive plan of care as evidenced by provider attestation and co-signature Inter-disciplinary care team collaboration (see longitudinal plan of care) Comprehensive medication review performed; medication list updated in electronic medical record  Hypertension BP goal <130/80; Currently controlled Current regimen:  Atenolol 232mdaily  Losartan 2522maily Interventions: Reviewed blood pressure goal Requested patient to check blood pressure once weekly and when symptomptic and record Continue current medication regimen Ensure daily salt intake < 2300 mg/day  Hyperlipidemia/Atherosclerosis/PVD Lab Results  Component Value Date   CHOL 128 12/16/2021   CHOL 121 09/01/2020   CHOL  117 04/03/2020   Lab Results  Component Value Date   HDL 40.20 12/16/2021   HDL 40.40 09/01/2020   HDL 37 (L) 04/03/2020   Lab Results  Component Value Date   LDLCALC 76 12/16/2021   [place] 09/01/2020   LDLCALC 67 04/03/2020   Lab Results  Component Value Date   TRIG 63.0 12/16/2021   TRIG 40.0 09/01/2020   TRIG 53 04/03/2020  LDL goal < 70; Last LDL was just a little above goal Denies/reports hypotensive/hypertensive symptoms Current regimen:  Atorvastatin 71m70mily Interventions: Discussed diet and exercise Discussed LDL goal Encouraged patient to be as active as possible - continue with physical therapy Maintain cholesterol medication regimen.   Diabetes Lab Results  Component Value Date/Time   HGBA1C 6.7 (H) 09/01/2020 10:57 AM   HGBA1C 6.5 (H) 04/03/2020 10:44 AM  A1c goal <7%; Currently at goal Current glucose readings: fasting glucose: 100 to 150 Denies/reports hypoglycemic/hyperglycemic symptoms Reports diarrhea every day in the evening Current regimen:  Metformin XR 500mg65make 2 tablets = 1000mg 76m morning Interventions: Discussed diet and exercise Reviewed home blood glucose readings and reviewed goals  Fasting blood glucose goal (before meals) = 80 to 130 Blood glucose goal after a meal = less than 180  Continue to check blood sugar once daily, document, and provide at future appointments Continue current regimen for diabetes Recommended trial of Metformin XR 500mg d13m to see if diarrhea improves.   Artial fibrillation CHADS2VASc score:  6 Previous cardioversion failed Current regimen:  Eliquis 5mg twi79mdaily Atenolol 25mg dai19mInterventions: Contacted [employer] patient assistance program to check status of Eliquis patient assistance program application.  [employer] has application for patient assistance program for 2023. Will need to follow up after patient he hits coverage gap in 2023 and spends 3% of household income on medications in 2023.    Medication management Pharmacist Clinical Goal(s): Over the next 90 days, patient will work with PharmD and providers to maintain optimal medication adherence [facility] [place] that aten[place]ol has bitter taste Interventions Comprehensive medication review performed. Continue current medication management  strategy Recommended he could take atenolol with a little peanut butter, nutella, applesause or yogurt to mask bitter taste  Patient Goals/Self-Care Activities Over the next 180 days, patient will:  take medications as prescribed,  check glucose daily, document, and provide at future appointments,  check blood pressure once a week, document, and provide at future appointments,  Notify office when you hit Medicare coverage gap for 2023.  Decrease dose of metformin to Er 559m once a day with food for the next 2 weeks to see if diarrhea improves.  Try taking atenolol with a little peanut butter, nutella, applesause or yogurt to mask bitter taste  Follow Up Plan: Telephone follow up appointment with care management team member scheduled for:  2 to 4 weeks.         Medication Assistance: Application for Eliquis / [employer]  medication assistance program. in process.  Anticipated assistance start date unknown - patient must hit Medicare coverage gap first and spent 3% of income out of pocket on medications for 2023 and has not met this criteria yet.  See plan of care for additional detail.  [facility]:  [facility] [id number]: [phone]: [phone]   Follow Up:  Patient agrees to Care Plan and Follow-up.  Plan: Telephone follow up appointment with care management team member scheduled for:  2 to 4 weeks  TCherre Brown PharmD Clinical Pharmacist [facility]

## 2021-12-16 NOTE — Patient Instructions (Signed)
Mr. Harry Brown It was a pleasure speaking with you today.  We have checked your thyroid function test, A1c and cholesterol. You should hear about results in the next 2 to 3 days.   Please try lowering dose of metformin ER 500mg  to take 1 tablet daily with food for the next week. Monitor to see if any change in diarrhea that you are experiencing in the evening.    If you have any questions or concerns, please feel free to contact me either at the phone number below or with a MyChart message.   Keep up the good work!  Cherre Robins, PharmD Clinical Pharmacist [place] [phone] (direct line)  [phone] (main office number)   Print copy of patient instructions, educational materials, and care plan provided in person.

## 2021-12-17 LAB — LIPID PANEL
Cholesterol: 128 mg/dL (ref 0–200)
HDL: 40.2 mg/dL (ref >39.00)
LDL Cholesterol: 76 mg/dL (ref 0–99)
NonHDL: 88.24
Total CHOL/HDL Ratio: 3
Triglycerides: 63 mg/dL (ref 0.0–149.0)
VLDL: 12.6 mg/dL (ref 0.0–40.0)

## 2021-12-17 LAB — HEMOGLOBIN A1C: Hgb A1c MFr Bld: 6.8 % — ABNORMAL HIGH (ref 4.6–6.5)

## 2021-12-17 LAB — LDL CHOLESTEROL, DIRECT: Direct LDL: 77 mg/dL

## 2021-12-17 LAB — TSH: TSH: 3.98 u[IU]/mL (ref 0.35–5.50)

## 2021-12-17 NOTE — Telephone Encounter (Signed)
Patient would like his place card mailed to him.

## 2021-12-18 NOTE — Telephone Encounter (Signed)
Form mailed

## 2021-12-22 DIAGNOSIS — M545 Low back pain, unspecified: Secondary | ICD-10-CM | POA: Diagnosis not present

## 2021-12-22 DIAGNOSIS — M9903 Segmental and somatic dysfunction of lumbar region: Secondary | ICD-10-CM | POA: Diagnosis not present

## 2022-01-04 DIAGNOSIS — E782 Mixed hyperlipidemia: Secondary | ICD-10-CM

## 2022-01-04 DIAGNOSIS — E039 Hypothyroidism, unspecified: Secondary | ICD-10-CM

## 2022-01-04 DIAGNOSIS — E1151 Type 2 diabetes mellitus with diabetic peripheral angiopathy without gangrene: Secondary | ICD-10-CM | POA: Diagnosis not present

## 2022-01-04 DIAGNOSIS — I700 Atherosclerosis of aorta: Secondary | ICD-10-CM | POA: Diagnosis not present

## 2022-01-11 ENCOUNTER — Other Ambulatory Visit: Payer: Self-pay | Admitting: Family Medicine

## 2022-01-25 ENCOUNTER — Other Ambulatory Visit: Payer: Self-pay | Admitting: Family Medicine

## 2022-01-26 ENCOUNTER — Ambulatory Visit (INDEPENDENT_AMBULATORY_CARE_PROVIDER_SITE_OTHER): Payer: Medicare Other | Admitting: Family

## 2022-01-26 VITALS — BP 137/74 | HR 72 | Temp 98.6°F | Resp 16 | Wt 191.0 lb

## 2022-01-26 DIAGNOSIS — E1151 Type 2 diabetes mellitus with diabetic peripheral angiopathy without gangrene: Secondary | ICD-10-CM | POA: Diagnosis not present

## 2022-01-26 DIAGNOSIS — I10 Essential (primary) hypertension: Secondary | ICD-10-CM

## 2022-01-26 DIAGNOSIS — I4891 Unspecified atrial fibrillation: Secondary | ICD-10-CM | POA: Diagnosis not present

## 2022-01-26 DIAGNOSIS — E785 Hyperlipidemia, unspecified: Secondary | ICD-10-CM

## 2022-01-26 DIAGNOSIS — E039 Hypothyroidism, unspecified: Secondary | ICD-10-CM | POA: Diagnosis not present

## 2022-01-26 DIAGNOSIS — I700 Atherosclerosis of aorta: Secondary | ICD-10-CM

## 2022-01-26 DIAGNOSIS — R5383 Other fatigue: Secondary | ICD-10-CM | POA: Diagnosis not present

## 2022-01-26 MED ORDER — APIXABAN 5 MG PO TABS: ORAL_TABLET | ORAL | 5 refills | Status: DC

## 2022-01-26 NOTE — Assessment & Plan Note (Addendum)
Rate stable on atenolol.  EKG tracing is personally reviewed.  EKG notes AF with Rate 81. He reports previous unsuccessful cardioversion. I wonder if his overall fatigue is related to symptomatic AF. He is overdue for follow up with his cardiologist. I have advised him to schedule this follow up appointment. Continue on Eliquis. Refills provided.

## 2022-01-26 NOTE — Assessment & Plan Note (Signed)
Maintained on synthroid, TSH WNL. Continue same.

## 2022-01-26 NOTE — Assessment & Plan Note (Signed)
A1C at goal on Metformin xr, continue same.

## 2022-01-26 NOTE — Progress Notes (Signed)
Subjective:     Patient ID: Harry Brown., male    DOB: 1941-02-13, 81 y.o.   MRN: [id number]  Chief Complaint  Patient presents with   Diabetes    Here for follow up, patient reports he decreased Metformin to once a day    Atrial Fibrillation    Here for follow up, needs refill for eliquis    Diabetes  Atrial Fibrillation Past medical history includes atrial fibrillation.   Patient is in today for follow up.  HTN- maintained on atenolol and losartan.  BP Readings from Last 3 Encounters:  01/26/22 137/74  09/04/21 123/74  08/25/21 (!) 118/56   Hyperlipidemia- maintained on atorvastatin 40mg .  Lab Results  Component Value Date   CHOL 128 12/16/2021   HDL 40.20 12/16/2021   LDLCALC 76 12/16/2021   LDLDIRECT 77.0 12/16/2021   TRIG 63.0 12/16/2021   CHOLHDL 3 12/16/2021   DM2- maintained on metformin xr 500mg  1 tablet daily.   Lab Results  Component Value Date   HGBA1C 6.8 (H) 12/16/2021   HGBA1C 6.7 (H) 09/01/2020   HGBA1C 6.5 (H) 04/03/2020   Lab Results  Component Value Date   MICROALBUR 0.7 03/11/2016   LDLCALC 76 12/16/2021   CREATININE 1.11 08/10/2021   Hypothyroid- maintained on synthroid 75 mcg.  Lab Results  Component Value Date   TSH 3.98 12/16/2021   AF- requesting refill on Eliquis.   Wife reports that pharmacist decreased his metformin to 1 tab daily to see if this would help with his fatigue. They haven't noticed much of a differenct. Notes that for the last several months he has increased fatigue. For example, he took the trash out to the curb the other day and when he got back to the house he was completely worn out. Reports good hydration/po intake.   Health Maintenance Due  Topic Date Due   Zoster Vaccines- Shingrix (1 of 2) Never done   TETANUS/TDAP  06/08/2019   COVID-19 Vaccine (5 - Pfizer series) 08/11/2021   FOOT EXAM  09/01/2021   INFLUENZA VACCINE  01/05/2022    Past Medical History:  Diagnosis Date   Anemia  09/24/2013   CAD (coronary artery disease)    Cellulitis 05/14/2013   RLE   Diverticulitis    ED (erectile dysfunction)    Glaucoma 08/18/2014   History of sleep apnea    had surgery to correct   Hypertension    Hypothyroidism    Medicare annual wellness visit, subsequent 04/13/2013   Myocardial infarction ([place]) 11/05/1985   Nocturia 03/15/2017   Other and unspecified hyperlipidemia    Penile lesion 02/24/2015   Peripheral neuropathy 03/31/2012   Peripheral vascular disease ([place])    Postoperative anemia due to acute blood loss 09/24/2013   Preventative health care 03/13/2016   Sleep apnea 09/16/2017   Type II diabetes mellitus ([place])    fasting 100-120   Urinary urgency 11/22/2012    Past Surgical History:  Procedure Laterality Date   ABDOMINAL AORTAGRAM  Oct. 9, 2014   Dr. Bridgett Larsson   ABDOMINAL Maxcine Ham N/A 03/15/2013   Procedure: ABDOMINAL Maxcine Ham;  Surgeon: Conrad De Soto, MD;  Location: [facility] CATH LAB;  Service: Cardiovascular;  Laterality: N/A;   ANTERIOR CERVICAL DECOMP/DISCECTOMY FUSION  ~ 2000   CARDIOVERSION N/A 12/03/2019   Procedure: CARDIOVERSION;  Surgeon: Acie Fredrickson, Wonda Cheng, MD;  Location: [place];  Service: Cardiovascular;  Laterality: N/A;   CATARACT EXTRACTION Right    CORONARY ARTERY BYPASS GRAFT  2001   "  CABG X3" (05/14/2013)   DENTAL SURGERY     "multiple teeth removed; trmimed top of mouth so dentures would fit" (05/14/2013)   EYE SURGERY     cataracts   FEMORAL-POPLITEAL BYPASS GRAFT Right 04/25/2013   Procedure: RIGHT FEMORAL TO ABOVE KNEE POPLITEAL BYPASS GRAFT WITH RIGHT GREATER SAPHENOUS VEIN HARVEST; ULTRASOUND GUIDED;  Surgeon: Conrad Mount Horeb, MD;  Location[facility] OR;  Service: Vascular;  Laterality: Right;   GUM SURGERY  [phone]   "had bone scraped; had periodontal disease" (05/14/2013)   PALATE / UVULA BIOPSY / EXCISION  2003   Removed due to sleep apnea   TEE WITHOUT CARDIOVERSION N/A 06/13/2020   Procedure: TRANSESOPHAGEAL ECHOCARDIOGRAM (TEE);  Surgeon: Geralynn Rile, MD;  Location: [place];  Service: Cardiovascular;  Laterality: N/A;   TONSILLECTOMY      Family History  Problem Relation Age of Onset   Hyperlipidemia Mother    Heart disease Mother    Diabetes Mother    Hypertension Mother    Heart disease Father    Asthma Father    Arthritis Father    Heart disease Sister    Heart disease Sister        s/p bypass x 2   Lupus Sister    Heart disease Brother        MI waiting on heart transplant when died   Heart disease Brother        massive MI   Cancer Neg Hx     Social History   Socioeconomic History   Marital status: Married    Spouse name: Not on file   Number of children: 8    Years of education: Not on file   Highest education level: Not on file  Occupational History    Employer: [employer]  Tobacco Use   Smoking status: Former    Packs/day: 1.50    Years: 50.00    Total pack years: 75.00    Types: Cigarettes   Smokeless tobacco: Never   Tobacco comments:    05/14/2013 "quit smoking in ~ 2004; still Uses Comit lozenges"  Vaping Use   Vaping Use: Never used  Substance and Sexual Activity   Alcohol use: No    Alcohol/week: 0.0 standard drinks of alcohol   Drug use: No   Sexual activity: Yes    Comment: lives with wife, retiring end of next week, no dietary restrictions  Other Topics Concern   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 [employer]   Social Determinants of Health   Financial Resource Strain: Medium Risk (04/09/2021)   Overall Financial Resource Strain (CARDIA)    Difficulty of Paying Living Expenses: Somewhat hard  Food Insecurity: No Food Insecurity (03/25/2021)   Hunger Vital Sign    Worried About Running Out of Food in the Last Year: Never true    Ran Out of Food in the Last Year: Never true  Transportation Needs: No Transportation Needs (03/25/2021)   PRAPARE - Hydrologist  (Medical): No    Lack of Transportation (Non-Medical): No  Physical Activity: Inactive (10/07/2020)   Exercise Vital Sign    Days of Exercise per Week: 0 days    Minutes of Exercise per Session: 0 min  Stress: No Stress Concern Present (03/25/2021)   [place]    Feeling of Stress : Not at all  Social Connections: Socially Integrated (03/25/2021)   Social Connection and Isolation Panel [NHANES]    Frequency of Communication with Friends and Family: More than three times a week    Frequency of Social Gatherings with Friends and Family: More than three times a week    Attends Religious Services: More than 4 times per year    Active Member of [employer] or Organizations: Yes    Attends Music therapist: More than 4 times per year    Marital Status: Married  Human resources officer Violence: Not At Risk (03/25/2021)   Humiliation, Afraid, Rape, and Kick questionnaire    Fear of Current or Ex-Partner: No    Emotionally Abused: No    Physically Abused: No    Sexually Abused: No    Outpatient Medications Prior to Visit  Medication Sig Dispense Refill   atenolol (TENORMIN) 25 MG tablet Take 1 tablet (25 mg total) by mouth daily. 90 tablet 0   atorvastatin (LIPITOR) 40 MG tablet Take 1 tablet (40 mg total) by mouth daily. 90 tablet 0   Cholecalciferol (VITAMIN D) 50 MCG (2000 UT) tablet Take 2,000 Units by mouth daily.      latanoprost (XALATAN) 0.005 % ophthalmic solution Place 1 drop into both eyes at bedtime.      levothyroxine (SYNTHROID) 75 MCG tablet Take 1 tablet (75 mcg total) by mouth daily before breakfast. 90 tablet 0   loratadine (CLARITIN) 10 MG tablet Take 10 mg by mouth daily as needed for allergies.     losartan (COZAAR) 25 MG tablet Take 1 tablet (25 mg total) by mouth daily. 90 tablet 1   metFORMIN (GLUCOPHAGE-XR) 500 MG 24 hr tablet TAKE TWO (2) TABLETS BY MOUTH EVERY MORNING WITH BREAKFAST 180 tablet 1    ONETOUCH DELICA LANCETS 26R MISC USE TO CHECK BLOOD SUGAR ONCE DAILY 100 each 1   ONETOUCH ULTRA test strip USE 1 STRIP DAILY 100 each 12   tamsulosin (FLOMAX) 0.4 MG CAPS capsule Take 1 capsule (0.4 mg total) by mouth daily. 90 capsule 1   vitamin B-12 (CYANOCOBALAMIN) 1000 MCG tablet Take 1,000 mcg by mouth daily.     apixaban (ELIQUIS) 5 MG TABS tablet TAKE ONE (1) TABLET BY MOUTH TWO (2) TIMES DAILY 60 tablet 0   nitroGLYCERIN (NITROSTAT) 0.4 MG SL tablet Place 1 tablet (0.4 mg total) under the tongue every 5 (five) minutes as needed for chest pain. 25 tablet 11   No facility-administered medications prior to visit.    Allergies  Allergen Reactions   Adhesive [Tape] Rash   Latex Rash   Other Hives, Swelling and Rash    Plastic shopping bags Metal staples: rash, swelling    ROS See HPI    Objective:    Physical Exam Constitutional:      General: He is not in acute distress.    Appearance: He is well-developed.  HENT:     Head: Normocephalic and atraumatic.  Cardiovascular:     Rate and Rhythm: Normal rate. Rhythm irregular.     Heart sounds: No murmur heard. Pulmonary:     Effort: Pulmonary effort is normal. No respiratory distress.     Breath sounds: Normal breath sounds. No wheezing or rales.  Musculoskeletal:        General: No swelling.  Skin:    General: Skin is warm and dry.  Neurological:     Mental Status: He is alert and oriented to person, place, and time.  Psychiatric:  Behavior: Behavior normal.        Thought Content: Thought content normal.     BP 137/74 (BP Location: Right Arm, Patient Position: Sitting, Cuff Size: Small)   Pulse 72   Temp 98.6 F (37 C) (Oral)   Resp 16   Wt 191 lb (86.6 kg)   SpO2 98%   BMI 25.90 kg/m  Wt Readings from Last 3 Encounters:  01/26/22 191 lb (86.6 kg)  09/04/21 193 lb 9.6 oz (87.8 kg)  08/25/21 194 lb (88 kg)       Assessment & Plan:   Problem List Items Addressed This Visit        Unprioritized   Type 2 diabetes mellitus with atherosclerosis of aorta (HCC)    A1C at goal on Metformin xr, continue same.       Relevant Medications   apixaban (ELIQUIS) 5 MG TABS tablet   Hypothyroidism    Maintained on synthroid, TSH WNL. Continue same.       Hypertension    BP looks good on current doses of atenolol and losartan.       Relevant Medications   apixaban (ELIQUIS) 5 MG TABS tablet   Hyperlipidemia    LDL at goal on atorvastatin 40mg . Continue same.       Relevant Medications   apixaban (ELIQUIS) 5 MG TABS tablet   Atrial fibrillation (HCC)    Rate stable on atenolol.  EKG tracing is personally reviewed.  EKG notes AF with Rate 81. He reports previous unsuccessful cardioversion. I wonder if his overall fatigue is related to symptomatic AF. He is overdue for follow up with his cardiologist. I have advised him to schedule this follow up appointment. Continue on Eliquis. Refills provided.       Relevant Medications   apixaban (ELIQUIS) 5 MG TABS tablet   Other Relevant Orders   EKG 12-Lead (Completed)   Other Visit Diagnoses     Fatigue, unspecified type    -  Primary   Relevant Orders   Basic metabolic panel   CBC with Differential/Platelet       I am having Harry Saxon T. Eleazer Sr. "Tim" maintain his loratadine, latanoprost, Vitamin D, OneTouch Delica Lancets 63S, nitroGLYCERIN, cyanocobalamin, losartan, tamsulosin, metFORMIN, levothyroxine, OneTouch Ultra, atorvastatin, atenolol, and apixaban.  Meds ordered this encounter  Medications   apixaban (ELIQUIS) 5 MG TABS tablet    Sig: TAKE ONE (1) TABLET BY MOUTH TWO (2) TIMES DAILY    Dispense:  60 tablet    Refill:  5    Requested drug refills are authorized, however, the patient needs further evaluation and/or laboratory testing before further refills are given. Ask him to make an appointment for this.    Order Specific Question:   Supervising Provider    Answer:   Penni Homans A [[id number]]

## 2022-01-26 NOTE — Assessment & Plan Note (Signed)
LDL at goal on atorvastatin 40mg . Continue same.

## 2022-01-26 NOTE — Assessment & Plan Note (Signed)
BP looks good on current doses of atenolol and losartan.

## 2022-01-27 LAB — BASIC METABOLIC PANEL
BUN: 22 mg/dL (ref 6–23)
CO2: 25 mEq/L (ref 19–32)
Calcium: 9.2 mg/dL (ref 8.4–10.5)
Chloride: 102 mEq/L (ref 96–112)
Creatinine, Ser: 1.03 mg/dL (ref 0.40–1.50)
GFR: 67.81 mL/min (ref >60.00)
Glucose, Bld: 74 mg/dL (ref 70–99)
Potassium: 4.6 mEq/L (ref 3.5–5.1)
Sodium: 138 mEq/L (ref 135–145)

## 2022-01-27 LAB — CBC WITH DIFFERENTIAL/PLATELET
Basophils Absolute: 0.1 10*3/uL (ref 0.0–0.1)
Basophils Relative: 1.3 % (ref 0.0–3.0)
Eosinophils Absolute: 0.3 10*3/uL (ref 0.0–0.7)
Eosinophils Relative: 2.8 % (ref 0.0–5.0)
HCT: 36.5 % — ABNORMAL LOW (ref 39.0–52.0)
Hemoglobin: 12.2 g/dL — ABNORMAL LOW (ref 13.0–17.0)
Lymphocytes Relative: 27.5 % (ref 12.0–46.0)
Lymphs Abs: 2.7 10*3/uL (ref 0.7–4.0)
MCHC: 33.5 g/dL (ref 30.0–36.0)
MCV: 93.4 fl (ref 78.0–100.0)
Monocytes Absolute: 1 10*3/uL (ref 0.1–1.0)
Monocytes Relative: 10.5 % (ref 3.0–12.0)
Neutro Abs: 5.7 10*3/uL (ref 1.4–7.7)
Neutrophils Relative %: 57.9 % (ref 43.0–77.0)
Platelets: 240 10*3/uL (ref 150.0–400.0)
RBC: 3.91 Mil/uL — ABNORMAL LOW (ref 4.22–5.81)
RDW: 14.6 % (ref 11.5–15.5)
WBC: 9.8 10*3/uL (ref 4.0–10.5)

## 2022-01-29 ENCOUNTER — Telehealth: Payer: Self-pay | Admitting: Cardiology

## 2022-01-29 NOTE — Telephone Encounter (Signed)
Attempt to return call-left message to call back

## 2022-01-29 NOTE — Telephone Encounter (Signed)
Pt c/o Shortness Of Breath: STAT if SOB developed within the last 24 hours or pt is noticeably SOB on the phone  1. Are you currently SOB (can you hear that pt is SOB on the phone)? No  2. How long have you been experiencing SOB? 2 or 3 months  3. Are you SOB when sitting or when up moving around? Moving around  4. Are you currently experiencing any other symptoms? Pt's wife is stating that patient is having slight SOB accompanied with extreme fatigue. Requesting call back to discuss this.

## 2022-02-02 NOTE — Progress Notes (Unsigned)
Cardiology Clinic Note   Patient Name: Harry SHAKESPEARE Sr. Date of Encounter: 02/04/2022  Primary Care Provider:  [name], MD Primary Cardiologist:  None  Patient Profile     Harry Angst Sr. 81 year old male presents to the clinic today for evaluation of his shortness of breath.  Past Medical History    Past Medical History:  Diagnosis Date   Anemia 09/24/2013   CAD (coronary artery disease)    Cellulitis 05/14/2013   RLE   Diverticulitis    ED (erectile dysfunction)    Glaucoma 08/18/2014   History of sleep apnea    had surgery to correct   Hypertension    Hypothyroidism    Medicare annual wellness visit, subsequent 04/13/2013   Myocardial infarction ([place]) 11/05/1985   Nocturia 03/15/2017   Other and unspecified hyperlipidemia    Penile lesion 02/24/2015   Peripheral neuropathy 03/31/2012   Peripheral vascular disease ([facility])    Postoperative anemia due to acute blood loss 09/24/2013   Preventative health care 03/13/2016   Sleep apnea 09/16/2017   Type II diabetes mellitus ([place])    fasting 100-120   Urinary urgency 11/22/2012   Past Surgical History:  Procedure Laterality Date   ABDOMINAL AORTAGRAM  Oct. 9, 2014   Dr. Bridgett Larsson   ABDOMINAL Maxcine Ham N/A 03/15/2013   Procedure: ABDOMINAL Maxcine Ham;  Surgeon: Conrad Loch Sheldrake, MD;  Location: [facility] CATH LAB;  Service: Cardiovascular;  Laterality: N/A;   ANTERIOR CERVICAL DECOMP/DISCECTOMY FUSION  ~ 2000   CARDIOVERSION N/A 12/03/2019   Procedure: CARDIOVERSION;  Surgeon: Acie Fredrickson, Wonda Cheng, MD;  Location: [facility] ENDOSCOPY;  Service: Cardiovascular;  Laterality: N/A;   CATARACT EXTRACTION Right    CORONARY ARTERY BYPASS GRAFT  2001   "CABG X3" (05/14/2013)   DENTAL SURGERY     "multiple teeth removed; trmimed top of mouth so dentures would fit" (05/14/2013)   EYE SURGERY     cataracts   FEMORAL-POPLITEAL BYPASS GRAFT Right 04/25/2013   Procedure: RIGHT FEMORAL TO ABOVE KNEE POPLITEAL BYPASS GRAFT WITH RIGHT [place]; ULTRASOUND GUIDED;  Surgeon: Conrad [place], MD;  Location: [facility] OR;  Service: Vascular;  Laterality: Right;   GUM SURGERY  [phone]   "had bone scraped; had periodontal disease" (05/14/2013)   PALATE / UVULA BIOPSY / EXCISION  2003   Removed due to sleep apnea   TEE WITHOUT CARDIOVERSION N/A 06/13/2020   Procedure: TRANSESOPHAGEAL ECHOCARDIOGRAM (TEE);  Surgeon: Geralynn Rile, MD;  Location: [facility];  Service: Cardiovascular;  Laterality: N/A;   TONSILLECTOMY      Allergies  Allergies  Allergen Reactions   Adhesive [Tape] Rash   Latex Rash   Other Hives, Swelling and Rash    Plastic shopping bags Metal staples: rash, swelling    History of Present Illness    Harry Angst Sr. is a PMH of type 2 diabetes, PVD, abdominal aortic aneurysm, hypertension, atrial fibrillation, hypothyroidism, BPH, HLD, right hand tremor, paresthesia right lower extremity, and unstable gait.  He underwent CABG 7/21 with LIMA-LAD, RIMA-circumflex and SVG-PDA.  His nuclear stress test 3/20 showed no ischemia.  His ABIs 3/21 showed moderate decreased in the left.  CTA 6/21 showed 3 cm abdominal aortic aneurysm and 4.4 cm thoracic aortic aneurysm.  He underwent DCCV for atrial fibrillation 6/21.  His abdominal ultrasound 12/21 showed 3.5 cm abdominal aortic aneurysm.  His echocardiogram 1/22 showed normal LV function 60-65%, mild mitral regurgitation, mild-moderate tricuspid regurgitation, mild/moderate aortic insufficiency and mildly  dilated ascending aorta 40 mm.  He underwent TEE which showed normal LV function mild left atrial enlargement, mild mitral regurgitation, mild aortic insufficiency and no valvular vegetation.  He followed up with Dr. Stanford Breed 10/01/2020.  During that time he denied chest pain, dyspnea, palpitations, syncope and bleeding issues.  He contacted the nurse triage line on 01/29/2022.  He reported increased shortness of breath over the last 2-3 months.  He presents to  the clinic today for follow-up evaluation and states over the last 2 months he has noticed a hacking dry cough.  He and his wife denies sick contacts, fever, and chills.  He reports that his cough becomes worse with laying down.  He reports fatigue and dyspnea with exertion.  He has not performed increased exertion or exercise in the last several months.  He denies bleeding issues and reports compliance with his apixaban.  His most recent EKG shows atrial fibrillation.  His heart rate today is 73.  His blood pressure is 100/78.  He denies chest pain.  I will order a chest x-ray, CBC, BMP, echocardiogram and plan follow-up for 1-2 months.  Today the denies chest pain,  lower extremity edema,  palpitations, melena, hematuria, hemoptysis, diaphoresis, weakness, presyncope, syncope, orthopnea, and PND.   Home Medications    Prior to Admission medications   Medication Sig Start Date End Date Taking? Authorizing Provider  apixaban (ELIQUIS) 5 MG TABS tablet TAKE ONE (1) TABLET BY MOUTH TWO (2) TIMES DAILY 01/26/22   Debbrah Alar, NP  atenolol (TENORMIN) 25 MG tablet Take 1 tablet (25 mg total) by mouth daily. 01/25/22   [name], MD  atorvastatin (LIPITOR) 40 MG tablet Take 1 tablet (40 mg total) by mouth daily. 01/25/22   [name], MD  Cholecalciferol (VITAMIN D) 50 MCG (2000 UT) tablet Take 2,000 Units by mouth daily.     [provider]  latanoprost (XALATAN) 0.005 % ophthalmic solution Place 1 drop into both eyes at bedtime.  08/19/14   [provider]  levothyroxine (SYNTHROID) 75 MCG tablet Take 1 tablet (75 mcg total) by mouth daily before breakfast. 11/03/21   [name], MD  loratadine (CLARITIN) 10 MG tablet Take 10 mg by mouth daily as needed for allergies.    [provider]  losartan (COZAAR) 25 MG tablet Take 1 tablet (25 mg total) by mouth daily. 09/24/21   [name], MD  metFORMIN (GLUCOPHAGE-XR) 500 MG 24 hr tablet TAKE TWO (2) TABLETS  BY MOUTH EVERY MORNING WITH BREAKFAST 10/22/21   [name], MD  nitroGLYCERIN (NITROSTAT) 0.4 MG SL tablet Place 1 tablet (0.4 mg total) under the tongue every 5 (five) minutes as needed for chest pain. 08/07/18 02/06/20  Revankar, Reita Cliche, MD  ONETOUCH DELICA LANCETS 09F MISC USE TO CHECK BLOOD SUGAR ONCE DAILY 08/02/18   [name], MD  [facility] ULTRA test strip USE 1 STRIP DAILY 01/11/22   [name], MD  tamsulosin (FLOMAX) 0.4 MG CAPS capsule Take 1 capsule (0.4 mg total) by mouth daily. 09/24/21   [name], MD  vitamin B-12 (CYANOCOBALAMIN) 1000 MCG tablet Take 1,000 mcg by mouth daily.    [provider]    Family History    Family History  Problem Relation Age of Onset   Hyperlipidemia Mother    Heart disease Mother    Diabetes Mother    Hypertension Mother    Heart disease Father    Asthma Father  Arthritis Father    Heart disease Sister    Heart disease Sister        s/p bypass x 2   Lupus Sister    Heart disease Brother        MI waiting on heart transplant when died   Heart disease Brother        massive MI   Cancer Neg Hx    He indicated that his mother is deceased. He indicated that his father is deceased. He indicated that only one of his two sisters is alive. He indicated that all of his four brothers are deceased. He indicated that his maternal grandmother is deceased. He indicated that his maternal grandfather is deceased. He indicated that his paternal grandmother is deceased. He indicated that his paternal grandfather is deceased. He indicated that all of his three daughters are alive. He indicated that both of his sons are alive. He indicated that the status of his neg hx is unknown.  Social History    Social History   Socioeconomic History   Marital status: Married    Spouse name: Not on file   Number of children: 8    Years of education: Not on file   Highest education level: Not on file  Occupational History    Employer:  [employer]  Tobacco Use   Smoking status: Former    Packs/day: 1.50    Years: 50.00    Total pack years: 75.00    Types: Cigarettes   Smokeless tobacco: Never   Tobacco comments:    05/14/2013 "quit smoking in ~ 2004; still Uses Comit lozenges"  Vaping Use   Vaping Use: Never used  Substance and Sexual Activity   Alcohol use: No    Alcohol/week: 0.0 standard drinks of alcohol   Drug use: No   Sexual activity: Yes    Comment: lives with wife, retiring end of next week, no dietary restrictions  Other Topics Concern   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 [employer]   Social Determinants of Health   Financial Resource Strain: Medium Risk (04/09/2021)   Overall Financial Resource Strain (CARDIA)    Difficulty of Paying Living Expenses: Somewhat hard  Food Insecurity: No Food Insecurity (03/25/2021)   Hunger Vital Sign    Worried About Running Out of Food in the Last Year: Never true    [place] in the Last Year: Never true  Transportation Needs: No Transportation Needs (03/25/2021)   PRAPARE - Hydrologist (Medical): No    Lack of Transportation (Non-Medical): No  Physical Activity: Inactive (10/07/2020)   Exercise Vital Sign    Days of Exercise per Week: 0 days    Minutes of Exercise per Session: 0 min  Stress: No Stress Concern Present (03/25/2021)   [place]    Feeling of Stress : Not at all  Social Connections: [place] (03/25/2021)   Social Connection and Isolation Panel [NHANES]    Frequency of Communication with Friends and Family: More than three times a week    Frequency of Social Gatherings with Friends and Family: More than three times a week    Attends Religious Services: More than 4 times per year    Active Member of [employer] or Organizations: Yes    Attends Archivist  Meetings: More than 4 times per year  Marital Status: Married  Human resources officer Violence: Not At Risk (03/25/2021)   Humiliation, Afraid, Rape, and Kick questionnaire    Fear of Current or Ex-Partner: No    Emotionally Abused: No    Physically Abused: No    Sexually Abused: No     Review of Systems    General:  No chills, fever, night sweats or weight changes.  Cardiovascular:  No chest pain, dyspnea on exertion, edema, orthopnea, palpitations, paroxysmal nocturnal dyspnea. Dermatological: No rash, lesions/masses Respiratory: + cough, dyspnea Urologic: No hematuria, dysuria Abdominal:   No nausea, vomiting, diarrhea, bright red blood per rectum, melena, or hematemesis Neurologic:  No visual changes, wkns, changes in mental status. All other systems reviewed and are otherwise negative except as noted above.  Physical Exam    VS:  BP 100/76   Pulse 73   Ht 6' (1.829 m)   Wt 190 lb 3.2 oz (86.3 kg)   SpO2 98%   BMI 25.80 kg/m  , BMI Body mass index is 25.8 kg/m. GEN: Well nourished, well developed, in no acute distress. HEENT: normal. Neck: Supple, no JVD, carotid bruits, or masses. Cardiac: RRR, no murmurs, rubs, or gallops. No clubbing, cyanosis, edema.  Radials/DP/PT 2+ and equal bilaterally.  Respiratory:  Respirations regular and unlabored, diminished breath sounds right lower lobe. GI: Soft, nontender, nondistended, BS + x 4. MS: no deformity or atrophy. Skin: warm and dry, no rash. Neuro:  Strength and sensation are intact. Psych: Normal affect.  Accessory Clinical Findings    Recent Labs: 08/10/2021: ALT 19 12/16/2021: TSH 3.98 01/26/2022: BUN 22; Creatinine, Ser 1.03; Hemoglobin 12.2; Platelets 240.0; Potassium 4.6; Sodium 138   Recent Lipid Panel    Component Value Date/Time   CHOL 128 12/16/2021 1356   CHOL 122 09/08/2018 0904   TRIG 63.0 12/16/2021 1356   HDL 40.20 12/16/2021 1356   HDL 45 09/08/2018 0904   CHOLHDL 3 12/16/2021 1356   VLDL 12.6  12/16/2021 1356   LDLCALC 76 12/16/2021 1356   LDLCALC 67 04/03/2020 1044   LDLDIRECT 77.0 12/16/2021 1356         ECG personally reviewed by me today-none today.  Echocardiogram 01/06/21  IMPRESSIONS     1. Left ventricular ejection fraction, by estimation, is 50 to 55%. The  left ventricle has low normal function. The left ventricle demonstrates  global hypokinesis. Left ventricular diastolic function could not be  evaluated. There is septal paradox due  to previous CABG.   2. Right ventricular systolic function is mildly reduced. The right  ventricular size is normal.   3. Left atrial size was moderately dilated.   4. Right atrial size was moderately dilated.   5. The mitral valve is normal in structure. Mild mitral valve  regurgitation. No evidence of mitral stenosis. Moderate mitral annular  calcification.   6. The aortic valve is tricuspid. There is mild calcification of the  aortic valve. Aortic valve regurgitation is mild to moderate. Mild aortic  valve sclerosis is present, with no evidence of aortic valve stenosis.   7. Aortic dilatation noted. There is borderline dilatation of the aortic  root, measuring 39 mm.   8. The inferior vena cava is dilated in size with >50% respiratory  variability, suggesting right atrial pressure of 8 mmHg.  Assessment & Plan   1.  Shortness of breath, cough, fatigue, DOE-continues with shortness of breath today.  Reports it has been present for the last 2 to 3 months.  Denies sick contacts.  Diminished breath sounds right lower lobe Order echocardiogram Order chest x-ray  Atrial fibrillation-heart rate today 73 bpm.  Reports compliance with apixaban and denies bleeding issues. Continue apixaban, atenolol Avoid triggers caffeine, chocolate, EtOH, dehydration etc.  Coronary artery disease-no chest pain today.  Underwent CABG x3 12/1999. Continue atorvastatin, losartan Heart healthy low-sodium diet-salty 6 given Increase physical  activity as tolerated  Aortic valve disorder-which increased shortness of breath and activity intolerance over the last 2-3 months.  Echocardiogram 8-22 showed mildly calcified aortic valve with mild-moderate regurgitation and mild aortic sclerosis and no evidence of stenosis. Follow-up echocardiogram  Abdominal aortic aneurysm-echocardiogram 1/22 showed 40 mm dilated ascending aortic aneurysm. Continue to monitor  Thoracic aortic aneurysm-previously measured to be 4.4 cm on 6/21 and 4.0 cm ascending aortic aneurysm 11/11/2020.  Blood pressure well controlled at home. Follows with vascular surgery  Disposition: Follow-up with Dr. Stanford Breed or me in 1-2 months.   Jossie Ng. Moriya Mitchell NP-C     02/04/2022, 9:11 AM [place] [address] Office [phone] Fax [phone]  Notice: This dictation was prepared with Dragon dictation along with smaller phrase technology. Any transcriptional errors that result from this process are unintentional and may not be corrected upon review.  I spent 14 minutes examining this patient, reviewing medications, and using patient centered shared decision making involving her cardiac care.  Prior to her visit I spent greater than 20 minutes reviewing her past medical history,  medications, and prior cardiac tests.

## 2022-02-04 ENCOUNTER — Ambulatory Visit (HOSPITAL_BASED_OUTPATIENT_CLINIC_OR_DEPARTMENT_OTHER)
Admission: RE | Admit: 2022-02-04 | Discharge: 2022-02-04 | Disposition: A | Discharge status: Home / Self Care | Payer: Medicare Other | Source: Ambulatory Visit | Attending: General Practice | Admitting: General Practice

## 2022-02-04 ENCOUNTER — Encounter: Payer: Self-pay | Admitting: General Practice

## 2022-02-04 ENCOUNTER — Ambulatory Visit: Payer: Medicare Other | Attending: General Practice | Admitting: General Practice

## 2022-02-04 VITALS — BP 100/76 | HR 73 | Ht 72.0 in | Wt 190.2 lb

## 2022-02-04 DIAGNOSIS — R058 Other specified cough: Secondary | ICD-10-CM

## 2022-02-04 DIAGNOSIS — I712 Thoracic aortic aneurysm, without rupture, unspecified: Secondary | ICD-10-CM | POA: Diagnosis not present

## 2022-02-04 DIAGNOSIS — R0609 Other forms of dyspnea: Secondary | ICD-10-CM | POA: Diagnosis not present

## 2022-02-04 DIAGNOSIS — I714 Abdominal aortic aneurysm, without rupture, unspecified: Secondary | ICD-10-CM | POA: Diagnosis not present

## 2022-02-04 DIAGNOSIS — J90 Pleural effusion, not elsewhere classified: Secondary | ICD-10-CM | POA: Diagnosis not present

## 2022-02-04 DIAGNOSIS — I4821 Permanent atrial fibrillation: Secondary | ICD-10-CM | POA: Diagnosis not present

## 2022-02-04 DIAGNOSIS — R0602 Shortness of breath: Secondary | ICD-10-CM | POA: Diagnosis not present

## 2022-02-04 DIAGNOSIS — R5383 Other fatigue: Secondary | ICD-10-CM | POA: Diagnosis not present

## 2022-02-04 DIAGNOSIS — R059 Cough, unspecified: Secondary | ICD-10-CM | POA: Diagnosis not present

## 2022-02-04 DIAGNOSIS — I359 Nonrheumatic aortic valve disorder, unspecified: Secondary | ICD-10-CM | POA: Diagnosis not present

## 2022-02-04 DIAGNOSIS — I2510 Atherosclerotic heart disease of native coronary artery without angina pectoris: Secondary | ICD-10-CM

## 2022-02-04 NOTE — Patient Instructions (Signed)
Medication Instructions:  The current medical regimen is effective;  continue present plan and medications as directed. Please refer to the Current Medication list given to you today.   *If you need a refill on your cardiac medications before your next appointment, please call your pharmacy*  Lab Work:    Bmet and cbc today     If you have labs (blood work) drawn today and your tests are completely normal, you will receive your results only by:  1-MyChart Message (if you have MyChart) OR  2-A paper copy in the mail.  If you have any lab test that is abnormal or we need to change your treatment, we will call you to review the results.  You may also go to any of these [facility] locations:  [address],  [place] [address] ([facility])  3- [address]  [place] ([facility])  Testing/Procedures:  Echocardiogram - Your physician has requested that you have an echocardiogram. Echocardiography is a painless test that uses sound waves to create images of your heart. It provides your doctor with information about the size and shape of your heart and how well your heart's chambers and valves are working. This procedure takes approximately one hour. There are no restrictions for this procedure.   Chest xray - Your physician has requested that you have a chest xray, is a fast and painless imaging test that uses certain electromagnetic waves to create pictures of the structures in and around your chest. This test can help diagnose and monitor conditions such as pneumonia and other lung issues.  Follow-Up: Your next appointment:  1-2 month(s) In Person with Coletta Memos, FNP       At [facility], you and your health  needs are our priority.  As part of our continuing mission to provide you with exceptional heart care, we have created designated Provider Care Teams.  These Care Teams include your primary Cardiologist (physician) and Advanced Practice Providers (APPs -  Physician Assistants and Nurse Practitioners) who all work together to provide you with the care you need, when you need it.  Important Information About Sugar

## 2022-02-05 ENCOUNTER — Other Ambulatory Visit: Payer: Self-pay | Admitting: Family Medicine

## 2022-02-05 LAB — BASIC METABOLIC PANEL
BUN/Creatinine Ratio: 19 (ref 10–24)
BUN: 21 mg/dL (ref 8–27)
CO2: 26 mmol/L (ref 20–29)
Calcium: 9.8 mg/dL (ref 8.6–10.2)
Chloride: 104 mmol/L (ref 96–106)
Creatinine, Ser: 1.09 mg/dL (ref 0.76–1.27)
Glucose: 120 mg/dL — ABNORMAL HIGH (ref 70–99)
Potassium: 4.7 mmol/L (ref 3.5–5.2)
Sodium: 139 mmol/L (ref 134–144)
eGFR: 68 mL/min/{1.73_m2} (ref >59)

## 2022-02-05 LAB — CBC
Hematocrit: 38.1 % (ref 37.5–51.0)
Hemoglobin: 12.5 g/dL — ABNORMAL LOW (ref 13.0–17.7)
MCH: 30.7 pg (ref 26.6–33.0)
MCHC: 32.8 g/dL (ref 31.5–35.7)
MCV: 94 fL (ref 79–97)
Platelets: 248 10*3/uL (ref 150–450)
RBC: 4.07 x10E6/uL — ABNORMAL LOW (ref 4.14–5.80)
RDW: 13 % (ref 11.6–15.4)
WBC: 8.1 10*3/uL (ref 3.4–10.8)

## 2022-02-05 NOTE — Telephone Encounter (Signed)
Patient was seen in the office 8/31

## 2022-02-09 ENCOUNTER — Other Ambulatory Visit: Payer: Self-pay | Admitting: Family Medicine

## 2022-02-09 DIAGNOSIS — M9901 Segmental and somatic dysfunction of cervical region: Secondary | ICD-10-CM | POA: Diagnosis not present

## 2022-02-09 DIAGNOSIS — J90 Pleural effusion, not elsewhere classified: Secondary | ICD-10-CM

## 2022-02-09 DIAGNOSIS — M9903 Segmental and somatic dysfunction of lumbar region: Secondary | ICD-10-CM | POA: Diagnosis not present

## 2022-02-09 DIAGNOSIS — M545 Low back pain, unspecified: Secondary | ICD-10-CM | POA: Diagnosis not present

## 2022-02-09 NOTE — Progress Notes (Signed)
Called pt was advised and pt states yes we would like to processed  With the following Crx and referral

## 2022-02-11 ENCOUNTER — Other Ambulatory Visit: Payer: Self-pay | Admitting: Family Medicine

## 2022-02-12 ENCOUNTER — Ambulatory Visit (HOSPITAL_BASED_OUTPATIENT_CLINIC_OR_DEPARTMENT_OTHER)
Admission: RE | Admit: 2022-02-12 | Discharge: 2022-02-12 | Disposition: A | Discharge status: Home / Self Care | Payer: Medicare Other | Source: Ambulatory Visit | Attending: General Practice | Admitting: General Practice

## 2022-02-12 DIAGNOSIS — R5383 Other fatigue: Secondary | ICD-10-CM

## 2022-02-12 DIAGNOSIS — R0609 Other forms of dyspnea: Secondary | ICD-10-CM

## 2022-02-12 LAB — ECHOCARDIOGRAM COMPLETE
AR max vel: 1.61 cm2
AV Area VTI: 1.53 cm2
AV Area mean vel: 1.68 cm2
AV Mean grad: 5 mmHg
AV Peak grad: 9.7 mmHg
Ao pk vel: 1.56 m/s
P 1/2 time: 443 msec
S' Lateral: 3.4 cm

## 2022-02-12 NOTE — Progress Notes (Signed)
  Echocardiogram 2D Echocardiogram has been performed.  Merrie Roof F 02/12/2022, 11:40 AM

## 2022-02-17 ENCOUNTER — Other Ambulatory Visit: Payer: Self-pay | Admitting: Family Medicine

## 2022-02-17 ENCOUNTER — Encounter: Payer: Self-pay | Admitting: Family Medicine

## 2022-02-17 DIAGNOSIS — R0602 Shortness of breath: Secondary | ICD-10-CM

## 2022-02-17 DIAGNOSIS — J90 Pleural effusion, not elsewhere classified: Secondary | ICD-10-CM

## 2022-02-18 ENCOUNTER — Ambulatory Visit (INDEPENDENT_AMBULATORY_CARE_PROVIDER_SITE_OTHER): Payer: Medicare Other

## 2022-02-18 ENCOUNTER — Ambulatory Visit (HOSPITAL_BASED_OUTPATIENT_CLINIC_OR_DEPARTMENT_OTHER)
Admission: RE | Admit: 2022-02-18 | Discharge: 2022-02-18 | Disposition: A | Discharge status: Home / Self Care | Payer: Medicare Other | Source: Ambulatory Visit | Attending: Family Medicine | Admitting: Family Medicine

## 2022-02-18 ENCOUNTER — Other Ambulatory Visit (INDEPENDENT_AMBULATORY_CARE_PROVIDER_SITE_OTHER): Payer: Medicare Other

## 2022-02-18 ENCOUNTER — Other Ambulatory Visit: Payer: Self-pay

## 2022-02-18 VITALS — BP 115/70 | HR 84

## 2022-02-18 DIAGNOSIS — R0602 Shortness of breath: Secondary | ICD-10-CM

## 2022-02-18 DIAGNOSIS — E782 Mixed hyperlipidemia: Secondary | ICD-10-CM

## 2022-02-18 DIAGNOSIS — I10 Essential (primary) hypertension: Secondary | ICD-10-CM | POA: Diagnosis not present

## 2022-02-18 DIAGNOSIS — J90 Pleural effusion, not elsewhere classified: Secondary | ICD-10-CM

## 2022-02-18 NOTE — Progress Notes (Signed)
Pt here for Blood pressure check per Dr.Blyth give a verbal order For me to check vitals   Pt currently takes:  losartan (COZAAR) 25 MG tablet    Take 1 tablet (25 mg total) by mouth daily    atorvastatin (LIPITOR) 40 MG tablet    Take 1 tablet (40 mg total) by mouth daily    atenolol (TENORMIN) 25 MG tablet    Take 1 tablet (25 mg total) by mouth daily    ELIQUIS 5 MG TABS tablet    TAKE ONE (1) TABLET BY MOUTH TWO (2) TIMES DAILY     Pt reports compliance with medication.  BP today @ = 115/70 HR =84  Pt advised per follow up with Dr. Charlett Blake

## 2022-02-18 NOTE — Telephone Encounter (Signed)
Called pt was advised and lab appt made  And advised of Cxr

## 2022-02-19 LAB — COMPREHENSIVE METABOLIC PANEL
ALT: 19 U/L (ref 0–53)
AST: 24 U/L (ref 0–37)
Albumin: 4 g/dL (ref 3.5–5.2)
Alkaline Phosphatase: 90 U/L (ref 39–117)
BUN: 21 mg/dL (ref 6–23)
CO2: 27 mEq/L (ref 19–32)
Calcium: 9.8 mg/dL (ref 8.4–10.5)
Chloride: 103 mEq/L (ref 96–112)
Creatinine, Ser: 1.06 mg/dL (ref 0.40–1.50)
GFR: 65.48 mL/min (ref >60.00)
Glucose, Bld: 87 mg/dL (ref 70–99)
Potassium: 4.5 mEq/L (ref 3.5–5.1)
Sodium: 139 mEq/L (ref 135–145)
Total Bilirubin: 0.5 mg/dL (ref 0.2–1.2)
Total Protein: 7.1 g/dL (ref 6.0–8.3)

## 2022-02-19 LAB — CBC WITH DIFFERENTIAL/PLATELET
Basophils Absolute: 0.1 10*3/uL (ref 0.0–0.1)
Basophils Relative: 0.7 % (ref 0.0–3.0)
Eosinophils Absolute: 0.3 10*3/uL (ref 0.0–0.7)
Eosinophils Relative: 3.6 % (ref 0.0–5.0)
HCT: 38.8 % — ABNORMAL LOW (ref 39.0–52.0)
Hemoglobin: 12.8 g/dL — ABNORMAL LOW (ref 13.0–17.0)
Lymphocytes Relative: 26 % (ref 12.0–46.0)
Lymphs Abs: 2.2 10*3/uL (ref 0.7–4.0)
MCHC: 32.9 g/dL (ref 30.0–36.0)
MCV: 93.2 fl (ref 78.0–100.0)
Monocytes Absolute: 0.9 10*3/uL (ref 0.1–1.0)
Monocytes Relative: 10.7 % (ref 3.0–12.0)
Neutro Abs: 5 10*3/uL (ref 1.4–7.7)
Neutrophils Relative %: 59 % (ref 43.0–77.0)
Platelets: 235 10*3/uL (ref 150.0–400.0)
RBC: 4.16 Mil/uL — ABNORMAL LOW (ref 4.22–5.81)
RDW: 14.5 % (ref 11.5–15.5)
WBC: 8.5 10*3/uL (ref 4.0–10.5)

## 2022-02-19 LAB — BRAIN NATRIURETIC PEPTIDE: Pro B Natriuretic peptide (BNP): 169 pg/mL — ABNORMAL HIGH (ref 0.0–100.0)

## 2022-02-23 ENCOUNTER — Other Ambulatory Visit: Payer: Self-pay | Admitting: Family Medicine

## 2022-02-23 DIAGNOSIS — J90 Pleural effusion, not elsewhere classified: Secondary | ICD-10-CM

## 2022-02-23 DIAGNOSIS — R0602 Shortness of breath: Secondary | ICD-10-CM

## 2022-03-04 ENCOUNTER — Encounter (HOSPITAL_BASED_OUTPATIENT_CLINIC_OR_DEPARTMENT_OTHER): Payer: Self-pay

## 2022-03-04 ENCOUNTER — Ambulatory Visit (HOSPITAL_BASED_OUTPATIENT_CLINIC_OR_DEPARTMENT_OTHER)
Admission: RE | Admit: 2022-03-04 | Discharge: 2022-03-04 | Disposition: A | Discharge status: Home / Self Care | Payer: Medicare Other | Source: Ambulatory Visit | Attending: Family Medicine | Admitting: Family Medicine

## 2022-03-04 DIAGNOSIS — J90 Pleural effusion, not elsewhere classified: Secondary | ICD-10-CM | POA: Diagnosis not present

## 2022-03-04 DIAGNOSIS — R0602 Shortness of breath: Secondary | ICD-10-CM | POA: Insufficient documentation

## 2022-03-04 DIAGNOSIS — R911 Solitary pulmonary nodule: Secondary | ICD-10-CM | POA: Diagnosis not present

## 2022-03-04 DIAGNOSIS — R590 Localized enlarged lymph nodes: Secondary | ICD-10-CM | POA: Diagnosis not present

## 2022-03-04 DIAGNOSIS — R918 Other nonspecific abnormal finding of lung field: Secondary | ICD-10-CM | POA: Diagnosis not present

## 2022-03-04 MED ORDER — IOHEXOL 300 MG/ML  SOLN
100.0000 mL | Freq: Once | INTRAMUSCULAR | Status: AC | PRN
  Administered 2022-03-04: 75 mL via INTRAVENOUS

## 2022-03-07 ENCOUNTER — Other Ambulatory Visit: Payer: Self-pay | Admitting: Family Medicine

## 2022-03-07 DIAGNOSIS — R918 Other nonspecific abnormal finding of lung field: Secondary | ICD-10-CM

## 2022-03-07 DIAGNOSIS — J910 Malignant pleural effusion: Secondary | ICD-10-CM

## 2022-03-07 DIAGNOSIS — R590 Localized enlarged lymph nodes: Secondary | ICD-10-CM

## 2022-03-08 ENCOUNTER — Ambulatory Visit (INDEPENDENT_AMBULATORY_CARE_PROVIDER_SITE_OTHER): Payer: Medicare Other | Admitting: Family Medicine

## 2022-03-08 ENCOUNTER — Encounter: Payer: Self-pay | Admitting: Family Medicine

## 2022-03-08 DIAGNOSIS — J910 Malignant pleural effusion: Secondary | ICD-10-CM | POA: Diagnosis not present

## 2022-03-08 DIAGNOSIS — E1159 Type 2 diabetes mellitus with other circulatory complications: Secondary | ICD-10-CM

## 2022-03-08 DIAGNOSIS — I700 Atherosclerosis of aorta: Secondary | ICD-10-CM | POA: Diagnosis not present

## 2022-03-08 DIAGNOSIS — E1151 Type 2 diabetes mellitus with diabetic peripheral angiopathy without gangrene: Secondary | ICD-10-CM | POA: Diagnosis not present

## 2022-03-08 DIAGNOSIS — I10 Essential (primary) hypertension: Secondary | ICD-10-CM | POA: Diagnosis not present

## 2022-03-08 DIAGNOSIS — I2510 Atherosclerotic heart disease of native coronary artery without angina pectoris: Secondary | ICD-10-CM

## 2022-03-08 NOTE — Assessment & Plan Note (Signed)
Patient with persistent Pleural effusion and feeling weak and tired so CT scan was performed with results below on 10/1 1. Right lower lobe pulmonary nodules, bulky right hilar and mediastinal adenopathy suggesting metastatic carcinoma. 2. Moderate layering right pleural effusion He has an appt with Pulmonary on 03/10/2022 and spoke with him this morning about important it is to keep that appointment to help [place] pin down his diagnosis. He has also been referred to oncology for further evaluation and treatment. He expresses understanding during visit

## 2022-03-08 NOTE — Assessment & Plan Note (Signed)
Asymptomatic at this time. Found incidentally on CT imaging due to pleural effusion. Continue to manage risk factors for progression

## 2022-03-08 NOTE — Progress Notes (Signed)
Virtual telephone visit    Virtual Visit via Telephone Note   This visit type was conducted due to national recommendations for restrictions regarding the COVID-19 Pandemic (e.g. social distancing) in an effort to limit this patient's exposure and mitigate transmission in our community. Due to his co-morbid illnesses, this patient is at least at moderate risk for complications without adequate follow up. This format is felt to be most appropriate for this patient at this time. The patient did not have access to video technology or had technical difficulties with video requiring transitioning to audio format only (telephone). Physical exam was limited to content and character of the telephone converstion. Harry Brown, CMA was able to get the patient set up on a telephone visit.   Patient location: home Patient and provider in visit Provider location: Office  I discussed the limitations of evaluation and management by telemedicine and the availability of in person appointments. The patient expressed understanding and agreed to proceed.   Visit Date: 03/08/2022  Today's healthcare provider: Penni Homans, MD     Subjective:    Patient ID: Harry Meager., male    DOB: 02-06-1941, 81 y.o.   MRN: [id number]  No chief complaint on file.   HPI Patient is in today for follow up after CT scan over the weekend. The patient presented to cardiology in February 04, 2022. CXR at the time revealed pleural effusion, when patient did not respond to treatment and he continued with complaints of sob, fatigue and weakness persisted without signs of focal infection, repeat CXR revealed persistent effusion so CT of chest was ordered. This revealed a moderate likely malignant pleural effusion with mediastinal and hilar lymphadenopathy suggestive of metastatic cancer. Spoke with patient today and he reports his symptoms are the same and stable. He has an appt with pulmonology in 2 days and is aware of his CT  results. He reports he is handling his results adequately at this time. Well controlled, no changes to meds. Denies CP/palp/HA/congestion/fevers/GI or GU c/o. Taking meds as prescribed    Past Medical History:  Diagnosis Date   Anemia 09/24/2013   CAD (coronary artery disease)    Cellulitis 05/14/2013   RLE   Diverticulitis    ED (erectile dysfunction)    Glaucoma 08/18/2014   History of sleep apnea    had surgery to correct   Hypertension    Hypothyroidism    Medicare annual wellness visit, subsequent 04/13/2013   Myocardial infarction ([place]) 11/05/1985   Nocturia 03/15/2017   Other and unspecified hyperlipidemia    Penile lesion 02/24/2015   Peripheral neuropathy 03/31/2012   Peripheral vascular disease ([place])    Postoperative anemia due to acute blood loss 09/24/2013   Preventative health care 03/13/2016   Sleep apnea 09/16/2017   Type II diabetes mellitus ([place])    fasting 100-120   Urinary urgency 11/22/2012    Past Surgical History:  Procedure Laterality Date   ABDOMINAL AORTAGRAM  Oct. 9, 2014   Dr. Bridgett Larsson   ABDOMINAL Maxcine Ham N/A 03/15/2013   Procedure: ABDOMINAL Maxcine Ham;  Surgeon: Conrad Concord, MD;  Location: [facility] CATH LAB;  Service: Cardiovascular;  Laterality: N/A;   ANTERIOR CERVICAL DECOMP/DISCECTOMY FUSION  ~ 2000   CARDIOVERSION N/A 12/03/2019   Procedure: CARDIOVERSION;  Surgeon: Acie Fredrickson, Wonda Cheng, MD;  Location: [facility] ENDOSCOPY;  Service: Cardiovascular;  Laterality: N/A;   CATARACT EXTRACTION Right    CORONARY ARTERY BYPASS GRAFT  2001   "CABG X3" (05/14/2013)   DENTAL SURGERY     "  multiple teeth removed; trmimed top of mouth so dentures would fit" (05/14/2013)   EYE SURGERY     cataracts   FEMORAL-POPLITEAL BYPASS GRAFT Right 04/25/2013   Procedure: RIGHT FEMORAL TO ABOVE KNEE POPLITEAL BYPASS GRAFT WITH RIGHT GREATER SAPHENOUS VEIN HARVEST; ULTRASOUND GUIDED;  Surgeon: Brian L Chen, MD;  Location: [facility] OR;  Service: Vascular;  Laterality: Right;   GUM SURGERY  1990's   "had  bone scraped; had periodontal disease" (05/14/2013)   PALATE / UVULA BIOPSY / EXCISION  2003   Removed due to sleep apnea   TEE WITHOUT CARDIOVERSION N/A 06/13/2020   Procedure: TRANSESOPHAGEAL ECHOCARDIOGRAM (TEE);  Surgeon: O'Neal, [place] Thomas, MD;  Location: [facility] ENDOSCOPY;  Service: Cardiovascular;  Laterality: N/A;   TONSILLECTOMY      Family History  Problem Relation Age of Onset   Hyperlipidemia Mother    Heart disease Mother    Diabetes Mother    Hypertension Mother    Heart disease Father    Asthma Father    Arthritis Father    Heart disease Sister    Heart disease Sister        s/p bypass x 2   Lupus Sister    Heart disease Brother        MI waiting on heart transplant when died   Heart disease Brother        massive MI   Cancer Neg Hx     Social History   Socioeconomic History   Marital status: Married    Spouse name: Not on file   Number of children: 8    Years of education: Not on file   Highest education level: Not on file  Occupational History    Employer: [employer]  Tobacco Use   Smoking status: Former    Packs/day: 1.50    Years: 50.00    Total pack years: 75.00    Types: Cigarettes   Smokeless tobacco: Never   Tobacco comments:    05/14/2013 "quit smoking in ~ 2004; still Uses Comit lozenges"  Vaping Use   Vaping Use: Never used  Substance and Sexual Activity   Alcohol use: No    Alcohol/week: 0.0 standard drinks of alcohol   Drug use: No   Sexual activity: Yes    Comment: lives with wife, retiring end of next week, no dietary restrictions  Other Topics Concern   Not on file  Social History Narrative   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, 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 [employer]   Social Determinants of Health   Financial Resource Strain: Medium Risk (04/09/2021)   Overall Financial Resource Strain (CARDIA)    Difficulty of Paying Living Expenses: Somewhat hard  Food Insecurity: No Food Insecurity (03/25/2021)   Hunger Vital Sign     Worried About Running Out of Food in the Last Year: Never true    Ran Out of Food in the Last Year: Never true  Transportation Needs: No Transportation Needs (03/25/2021)   PRAPARE - Transportation    Lack of Transportation (Medical): No    Lack of Transportation (Non-Medical): No  Physical Activity: Inactive (10/07/2020)   Exercise Vital Sign    Days of Exercise per Week: 0 days    Minutes of Exercise per Session: 0 min  Stress: No Stress Concern Present (03/25/2021)   [employer] - Occupational Stress Questionnaire    Feeling of Stress : Not at all  Social Connections: Socially Integrated (03/25/2021)   Social Connection and   Isolation Panel [NHANES]    Frequency of Communication with Friends and Family: More than three times a week    Frequency of Social Gatherings with Friends and Family: More than three times a week    Attends Religious Services: More than 4 times per year    Active Member of Clubs or Organizations: Yes    Attends Club or Organization Meetings: More than 4 times per year    Marital Status: Married  Intimate Partner Violence: Not At Risk (03/25/2021)   Humiliation, Afraid, Rape, and Kick questionnaire    Fear of Current or Ex-Partner: No    Emotionally Abused: No    Physically Abused: No    Sexually Abused: No    Outpatient Medications Prior to Visit  Medication Sig Dispense Refill   atenolol (TENORMIN) 25 MG tablet Take 1 tablet (25 mg total) by mouth daily. 90 tablet 0   atorvastatin (LIPITOR) 40 MG tablet Take 1 tablet (40 mg total) by mouth daily. 90 tablet 0   Cholecalciferol (VITAMIN D) 50 MCG (2000 UT) tablet Take 2,000 Units by mouth daily.      ELIQUIS 5 MG TABS tablet TAKE ONE (1) TABLET BY MOUTH TWO (2) TIMES DAILY 60 tablet 5   latanoprost (XALATAN) 0.005 % ophthalmic solution Place 1 drop into both eyes at bedtime.      levothyroxine (SYNTHROID) 75 MCG tablet TAKE ONE (1) TABLET BY MOUTH EVERY DAY 90 tablet 0    loratadine (CLARITIN) 10 MG tablet Take 10 mg by mouth daily as needed for allergies.     losartan (COZAAR) 25 MG tablet Take 1 tablet (25 mg total) by mouth daily. 90 tablet 1   metFORMIN (GLUCOPHAGE-XR) 500 MG 24 hr tablet TAKE TWO (2) TABLETS BY MOUTH EVERY MORNING WITH BREAKFAST 180 tablet 1   nitroGLYCERIN (NITROSTAT) 0.4 MG SL tablet Place 1 tablet (0.4 mg total) under the tongue every 5 (five) minutes as needed for chest pain. 25 tablet 11   ONETOUCH DELICA LANCETS 33G MISC USE TO CHECK BLOOD SUGAR ONCE DAILY 100 each 1   ONETOUCH ULTRA test strip USE 1 STRIP DAILY 100 each 12   tamsulosin (FLOMAX) 0.4 MG CAPS capsule Take 1 capsule (0.4 mg total) by mouth daily. 90 capsule 1   vitamin B-12 (CYANOCOBALAMIN) 1000 MCG tablet Take 1,000 mcg by mouth daily.     No facility-administered medications prior to visit.    Allergies  Allergen Reactions   Adhesive [Tape] Rash   Latex Rash   Other Hives, Swelling and Rash    Plastic shopping bags Metal staples: rash, swelling    Review of Systems  Constitutional:  Positive for malaise/fatigue. Negative for fever.  HENT:  Negative for congestion.   Eyes:  Negative for blurred vision.  Respiratory:  Positive for cough and shortness of breath. Negative for hemoptysis.   Cardiovascular:  Negative for chest pain, palpitations and leg swelling.  Gastrointestinal:  Negative for abdominal pain, blood in stool and nausea.  Genitourinary:  Negative for dysuria and frequency.  Musculoskeletal:  Negative for falls.  Skin:  Negative for rash.  Neurological:  Positive for weakness. Negative for dizziness, loss of consciousness and headaches.  Endo/Heme/Allergies:  Negative for environmental allergies.  Psychiatric/Behavioral:  Negative for depression. The patient is not nervous/anxious.        Objective:    Physical Exam unable to obtain via phone visit.  There were no vitals taken for this visit. Wt Readings from Last 3 Encounters:  02/04/22    190 lb 3.2 oz (86.3 kg)  01/26/22 191 lb (86.6 kg)  09/04/21 193 lb 9.6 oz (87.8 kg)    Diabetic Foot Exam - Simple   No data filed    Lab Results  Component Value Date   WBC 8.5 02/18/2022   HGB 12.8 (L) 02/18/2022   HCT 38.8 (L) 02/18/2022   PLT 235.0 02/18/2022   GLUCOSE 87 02/18/2022   CHOL 128 12/16/2021   TRIG 63.0 12/16/2021   HDL 40.20 12/16/2021   LDLDIRECT 77.0 12/16/2021   LDLCALC 76 12/16/2021   ALT 19 02/18/2022   AST 24 02/18/2022   NA 139 02/18/2022   K 4.5 02/18/2022   CL 103 02/18/2022   CREATININE 1.06 02/18/2022   BUN 21 02/18/2022   CO2 27 02/18/2022   TSH 3.98 12/16/2021   PSA 1.47 05/02/2018   INR 1.5 (H) 06/08/2020   HGBA1C 6.8 (H) 12/16/2021   MICROALBUR 0.7 03/11/2016    Lab Results  Component Value Date   TSH 3.98 12/16/2021   Lab Results  Component Value Date   WBC 8.5 02/18/2022   HGB 12.8 (L) 02/18/2022   HCT 38.8 (L) 02/18/2022   MCV 93.2 02/18/2022   PLT 235.0 02/18/2022   Lab Results  Component Value Date   NA 139 02/18/2022   K 4.5 02/18/2022   CO2 27 02/18/2022   GLUCOSE 87 02/18/2022   BUN 21 02/18/2022   CREATININE 1.06 02/18/2022   BILITOT 0.5 02/18/2022   ALKPHOS 90 02/18/2022   AST 24 02/18/2022   ALT 19 02/18/2022   PROT 7.1 02/18/2022   ALBUMIN 4.0 02/18/2022   CALCIUM 9.8 02/18/2022   ANIONGAP 6 06/13/2020   EGFR 68 02/04/2022   GFR 65.48 02/18/2022   Lab Results  Component Value Date   CHOL 128 12/16/2021   Lab Results  Component Value Date   HDL 40.20 12/16/2021   Lab Results  Component Value Date   LDLCALC 76 12/16/2021   Lab Results  Component Value Date   TRIG 63.0 12/16/2021   Lab Results  Component Value Date   CHOLHDL 3 12/16/2021   Lab Results  Component Value Date   HGBA1C 6.8 (H) 12/16/2021       Assessment & Plan:   Problem List Items Addressed This Visit     Coronary artery disease due to type 2 diabetes mellitus (HCC)    Continue to treat risk factors. Follows with  cardiology      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 Increase exercise as tolerated. Continue current meds      Hypertension    Continue to monitor, no changes to meds. Encouraged heart healthy diet such as the DASH diet and exercise as tolerated.       Pleural effusion, malignant    Patient with persistent Pleural effusion and feeling weak and tired so CT scan was performed with results below on 10/1 1. Right lower lobe pulmonary nodules, bulky right hilar and mediastinal adenopathy suggesting metastatic carcinoma. 2. Moderate layering right pleural effusion He has an appt with Pulmonary on 03/10/2022 and spoke with him this morning about important it is to keep that appointment to help us pin down his diagnosis. He has also been referred to oncology for further evaluation and treatment. He expresses understanding during visit      Aortic atherosclerosis (HCC)    Asymptomatic at this time. Found incidentally on CT imaging due to pleural   effusion. Continue to manage risk factors for progression       I am having Harry Saxon T. Mcelroy Sr. "Tim" maintain his loratadine, latanoprost, Vitamin D, OneTouch Delica Lancets 25K, nitroGLYCERIN, cyanocobalamin, losartan, tamsulosin, metFORMIN, OneTouch Ultra, atorvastatin, atenolol, levothyroxine, and Eliquis.  No orders of the defined types were placed in this encounter.    I discussed the assessment and treatment plan with the patient. The patient was provided an opportunity to ask questions and all were answered. The patient agreed with the plan and demonstrated an understanding of the instructions.   The patient was advised to call back or seek an in-person evaluation if the symptoms worsen or if the condition fails to improve as anticipated.  I provided 26 minutes of non-face-to-face time during this encounter.   Penni Homans, MD [facility] [phone] (phone) [phone] (fax)  [place]

## 2022-03-08 NOTE — Assessment & Plan Note (Signed)
Continue to treat risk factors. Follows with cardiology

## 2022-03-08 NOTE — Assessment & Plan Note (Signed)
hgba1c acceptable, minimize simple carbs. Increase exercise as tolerated. Continue current meds 

## 2022-03-08 NOTE — Assessment & Plan Note (Signed)
Continue to monitor, no changes to meds. Encouraged heart healthy diet such as the DASH diet and exercise as tolerated.

## 2022-03-09 ENCOUNTER — Encounter: Payer: Self-pay | Admitting: *Deleted

## 2022-03-09 NOTE — Progress Notes (Signed)
Oncology Nurse Navigator Documentation     03/09/2022    2:45 PM  Oncology Nurse Navigator Flowsheets  Abnormal Finding Date 03/06/2022  Diagnosis Status Additional Work Up  Navigator Follow Up Date: 03/11/2022  Navigator Follow Up Reason: New Patient Appointment  Navigator Location [facility]  Referral Date to RadOnc/MedOnc 03/08/2022  Navigator Encounter Type Appt/Treatment Plan Review  Patient Visit Type MedOnc  Treatment Phase Abnormal Scans  Barriers/Navigation Needs Coordination of Care;Education  Interventions None Required  Acuity Level 2-Minimal Needs (1-2 Barriers Identified)  Time Spent with Patient 15

## 2022-03-09 NOTE — Progress Notes (Signed)
HPI: FU Atrial fibrillation, TAA, AAA and coronary artery disease. Had coronary artery bypassing graft in July of 2001 with a LIMA to the LAD, RIMA to the circumflex and saphenous vein graft to the PDA. Patient has known peripheral vascular disease followed by vascular surgery. Nuclear study March 2020 showed no ischemia. ABIs 3/21 showed moderate decrease on the left. CTA June 2021 showed 3 cm abdominal aortic aneurysm, 4.4 cm thoracic aortic aneurysm.  Had cardioversion of atrial fibrillation December 03, 2019.  Abdominal ultrasound December 2022 showed 3.7 cm abdominal aortic aneurysm, ectatic right common iliac artery at 1.7 cm.  Carotid Dopplers March 2023 showed less than 50% bilateral stenosis.  Echocardiogram September 2023 showed normal LV function, mild mitral regurgitation, mild aortic insufficiency, dilated ascending aorta at 41 mm.  Chest CT September 2023 showed right lower pulmonary nodules, right hilar and mediastinal adenopathy suggestive of metastatic carcinoma and a moderate pleural effusion.  Since last seen he has some dyspnea on exertion but improved following recent thoracentesis.  No orthopnea, PND, pedal edema, chest pain or syncope.  No palpitations.  No bleeding.  Current Outpatient Medications  Medication Sig Dispense Refill   atenolol (TENORMIN) 25 MG tablet Take 1 tablet (25 mg total) by mouth daily. 90 tablet 0   atorvastatin (LIPITOR) 40 MG tablet Take 1 tablet (40 mg total) by mouth daily. 90 tablet 0   Cholecalciferol (VITAMIN D) 50 MCG (2000 UT) tablet Take 2,000 Units by mouth daily.      ELIQUIS 5 MG TABS tablet TAKE ONE (1) TABLET BY MOUTH TWO (2) TIMES DAILY 60 tablet 5   latanoprost (XALATAN) 0.005 % ophthalmic solution Place 1 drop into both eyes at bedtime.      levothyroxine (SYNTHROID) 75 MCG tablet TAKE ONE (1) TABLET BY MOUTH EVERY DAY 90 tablet 0   loratadine (CLARITIN) 10 MG tablet Take 10 mg by mouth daily as needed for allergies.     losartan (COZAAR)  25 MG tablet Take 1 tablet (25 mg total) by mouth daily. 90 tablet 1   metFORMIN (GLUCOPHAGE-XR) 500 MG 24 hr tablet TAKE TWO (2) TABLETS BY MOUTH EVERY MORNING WITH BREAKFAST 180 tablet 1   ONETOUCH DELICA LANCETS 16W MISC USE TO CHECK BLOOD SUGAR ONCE DAILY 100 each 1   ONETOUCH ULTRA test strip USE 1 STRIP DAILY 100 each 12   tamsulosin (FLOMAX) 0.4 MG CAPS capsule Take 1 capsule (0.4 mg total) by mouth daily. 90 capsule 1   vitamin B-12 (CYANOCOBALAMIN) 1000 MCG tablet Take 1,000 mcg by mouth daily.     nitroGLYCERIN (NITROSTAT) 0.4 MG SL tablet Place 1 tablet (0.4 mg total) under the tongue every 5 (five) minutes as needed for chest pain. 25 tablet 11   No current facility-administered medications for this visit.     Past Medical History:  Diagnosis Date   Anemia 09/24/2013   CAD (coronary artery disease)    Cellulitis 05/14/2013   RLE   Diverticulitis    Dyspnea    03/15/22- due to fluid in lung.   ED (erectile dysfunction)    Glaucoma 08/18/2014   History of sleep apnea    had surgery to correct   Hypertension    Hypothyroidism    Medicare annual wellness visit, subsequent 04/13/2013   Myocardial infarction ([place]) 11/05/1985   Nocturia 03/15/2017   Other and unspecified hyperlipidemia    Penile lesion 02/24/2015   Peripheral neuropathy 03/31/2012   Peripheral vascular disease ([place])    Postoperative  anemia due to acute blood loss 09/24/2013   Preventative health care 03/13/2016   Sleep apnea 09/16/2017   Type II diabetes mellitus ([place])    fasting 100-120   Urinary urgency 11/22/2012    Past Surgical History:  Procedure Laterality Date   ABDOMINAL AORTAGRAM  Oct. 9, 2014   Dr. Bridgett Larsson   ABDOMINAL Maxcine Ham N/A 03/15/2013   Procedure: ABDOMINAL Maxcine Ham;  Surgeon: Conrad [place], MD;  Location: [facility] CATH LAB;  Service: Cardiovascular;  Laterality: N/A;   ANTERIOR CERVICAL DECOMP/DISCECTOMY FUSION  ~ 2000   CARDIOVERSION N/A 12/03/2019   Procedure: CARDIOVERSION;  Surgeon:  Acie Fredrickson, Wonda Cheng, MD;  Location: [facility] ENDOSCOPY;  Service: Cardiovascular;  Laterality: N/A;   CATARACT EXTRACTION Right    CORONARY ARTERY BYPASS GRAFT  2001   "CABG X3" (05/14/2013)   DENTAL SURGERY     "multiple teeth removed; trmimed top of mouth so dentures would fit" (05/14/2013)   EYE SURGERY     cataracts   FEMORAL-POPLITEAL BYPASS GRAFT Right 04/25/2013   Procedure: RIGHT FEMORAL TO ABOVE KNEE POPLITEAL BYPASS GRAFT WITH RIGHT [place]; ULTRASOUND GUIDED;  Surgeon: Conrad Asbury, MD;  Location: [place];  Service: Vascular;  Laterality: Right;   FINE NEEDLE ASPIRATION  03/16/2022   Procedure: FINE NEEDLE ASPIRATION;  Surgeon: Candee Furbish, MD;  Location: [facility] ENDOSCOPY;  Service: Pulmonary;;   GUM SURGERY  [phone]   "had bone scraped; had periodontal disease" (05/14/2013)   PALATE / UVULA BIOPSY / EXCISION  2003   Removed due to sleep apnea   TEE WITHOUT CARDIOVERSION N/A 06/13/2020   Procedure: TRANSESOPHAGEAL ECHOCARDIOGRAM (TEE);  Surgeon: Geralynn Rile, MD;  Location: [place];  Service: Cardiovascular;  Laterality: N/A;   THORACENTESIS N/A 03/16/2022   Procedure: Mathews Robinsons;  Surgeon: Candee Furbish, MD;  Location: [facility] ENDOSCOPY;  Service: Pulmonary;  Laterality: N/A;   TONSILLECTOMY     VIDEO BRONCHOSCOPY WITH ENDOBRONCHIAL ULTRASOUND N/A 03/16/2022   Procedure: VIDEO BRONCHOSCOPY WITH ENDOBRONCHIAL ULTRASOUND;  Surgeon: Candee Furbish, MD;  Location: [facility] ENDOSCOPY;  Service: Pulmonary;  Laterality: N/A;    Social History   Socioeconomic History   Marital status: Married    Spouse name: Not on file   Number of children: 8    Years of education: Not on file   Highest education level: Not on file  Occupational History    Employer: [employer]  Tobacco Use   Smoking status: Former    Packs/day: 1.50    Years: 50.00    Total pack years: 75.00    Types: Cigarettes    Quit date: 2006    Years since quitting: 17.8   Smokeless  tobacco: Never   Tobacco comments:    05/14/2013 "quit smoking in ~ 2004; still Uses Comit lozenges"  Vaping Use   Vaping Use: Never used  Substance and Sexual Activity   Alcohol use: No    Alcohol/week: 0.0 standard drinks of alcohol   Drug use: No   Sexual activity: Yes    Comment: lives with wife, retiring end of next week, no dietary restrictions  Other Topics Concern   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 [employer]   Social Determinants of Health   Financial Resource Strain: Medium Risk (04/09/2021)   Overall Financial Resource Strain (CARDIA)    Difficulty of Paying Living Expenses: Somewhat hard  Food Insecurity: No Food Insecurity (03/25/2021)  Hunger Vital Sign    Worried About Running Out of Food in the Last Year: Never true    Ran Out of Food in the Last Year: Never true  Transportation Needs: No Transportation Needs (03/25/2021)   PRAPARE - Hydrologist (Medical): No    Lack of Transportation (Non-Medical): No  Physical Activity: Inactive (10/07/2020)   Exercise Vital Sign    Days of Exercise per Week: 0 days    Minutes of Exercise per Session: 0 min  Stress: No Stress Concern Present (03/25/2021)   [place]    Feeling of Stress : Not at all  Social Connections: [place] (03/25/2021)   Social Connection and Isolation Panel [NHANES]    Frequency of Communication with Friends and Family: More than three times a week    Frequency of Social Gatherings with Friends and Family: More than three times a week    Attends Religious Services: More than 4 times per year    Active Member of [employer] or Organizations: Yes    Attends Music therapist: More than 4 times per year    Marital Status: Married  Human resources officer Violence: Not At Risk (03/25/2021)   Humiliation, Afraid, Rape, and Kick  questionnaire    Fear of Current or Ex-Partner: No    Emotionally Abused: No    Physically Abused: No    Sexually Abused: No    Family History  Problem Relation Age of Onset   Hyperlipidemia Mother    Heart disease Mother    Diabetes Mother    Hypertension Mother    Heart disease Father    Asthma Father    Arthritis Father    Heart disease Sister    Heart disease Sister        s/p bypass x 2   Lupus Sister    Heart disease Brother        MI waiting on heart transplant when died   Heart disease Brother        massive MI   Cancer Neg Hx     ROS: no fevers or chills, productive cough, hemoptysis, dysphasia, odynophagia, melena, hematochezia, dysuria, hematuria, rash, seizure activity, orthopnea, PND, pedal edema, claudication. Remaining systems are negative.  Physical Exam: Well-developed well-nourished in no acute distress.  Skin is warm and dry.  HEENT is normal.  Neck is supple.  Chest with diminished breath sounds right lower lobe. Cardiovascular exam is irregular Abdominal exam nontender or distended. No masses palpated. Extremities show no edema. neuro grossly intact   A/P  1 permanent atrial fibrillation-plan continue beta-blocker and apixaban.  2 coronary artery disease-no chest pain.  Continue statin.  He is not on aspirin given need for apixaban.  3 thoracic aortic aneurysm-recent echocardiogram showed mildly dilated aorta.  4 abdominal aortic aneurysm-plan follow-up abdominal ultrasound December 2023.  5 hypertension-patient's blood pressure is controlled.  Continue present medications.  6 hyperlipidemia-continue statin.  7 aortic insufficiency-mild on most recent echo.  8 malignant pleural effusion-now followed by pulmonary.  Patient has been diagnosed with metastatic small cell lung cancer.  Kirk Ruths, MD

## 2022-03-10 ENCOUNTER — Encounter: Payer: Self-pay | Admitting: Pulmonary Disease

## 2022-03-10 ENCOUNTER — Ambulatory Visit: Payer: Medicare Other | Admitting: Pulmonary Disease

## 2022-03-10 VITALS — BP 120/68 | HR 88 | Temp 98.4°F | Ht 72.0 in | Wt 184.6 lb

## 2022-03-10 DIAGNOSIS — J90 Pleural effusion, not elsewhere classified: Secondary | ICD-10-CM | POA: Diagnosis not present

## 2022-03-10 DIAGNOSIS — R918 Other nonspecific abnormal finding of lung field: Secondary | ICD-10-CM | POA: Insufficient documentation

## 2022-03-10 NOTE — Patient Instructions (Signed)
Nice to meet you  I am trying to schedule a thoracentesis or procedure to drain the fluid that has accumulated around the lung on the right.  Hopefully this will be scheduled for Friday afternoon.  Stop your Eliquis.  Do not take another dose until you hear from me.  I will give you instructions on when to resume it after the procedure.  If the procedure is delayed myself or my nurse will call and instruct you on what to do with the Eliquis in the meantime.  I hope the meeting with oncologist is helpful tomorrow.  I am requesting a office visit for return to clinic in 4 weeks so that we can check in and see how things are going.  If we need to reschedule this we can.

## 2022-03-10 NOTE — H&P (View-Only) (Signed)
@Patient  ID: Harry Meager., male    DOB: 05/05/1941, 81 y.o.   MRN: [id number]  Chief Complaint  Patient presents with   Consult    Consult for pleural effusion on the right side. Pt states this was diagnosed in August by cardiologist by xray. Pt states he is really SOB. CT scan done 9/28 and echocardiogram 9/8. No fluid medications noted from patient. Pt is currently on Eliquis twice a day. Pt noted dry cough for a few months now. No OTC medications noted.     Referring provider: [name], MD  HPI:   81 y.o. man whom are seen in consultation for evaluation of mediastinal mass and right-sided pleural effusion.  Recent PCP note reviewed.  Patient and wife present for visit today.  There is some weakness that developed maybe 3-4 months ago.  At 2 months ago he felt onset of shortness of breath.  Particularly dyspnea with exertion.  Progressed to minimal exertion causing significant shortness of breath.  This prompted ongoing cardiac evaluation.  This was some concern on exam for diminished breath sounds on the right base.  My review and interpretation chest x-ray 02/04/22 showed small right pleural effusion.  From my review interpretation chest x-ray okay 02/18/22 reveals similar to slightly smaller pleural effusion. This prompted CT scan 03/04/22 which revealed on my review and interpretation large mediastinal mass right paratracheal extending to right hilum all the way to the subcarinal area with a couple of notable right-sided nodules and a moderate right pleural effusion.  This prompted pulmonary consultation.  He smoked quite a lot.  Quit about 10 years ago.  75-pack-year history recorded.  No major issues of breathing prior to this.  PMH: Tobacco abuse in remission, atrial fibrillation on Eliquis, hypertension Surgical history: Cataract surgery, CABG, femoropopliteal bypass, tonsillectomy Family history: Mother with hyperlipidemia, CAD, diabetes, hypertension, father with CAD,  asthma Social history: Former smoker, 75-pack-year, quit 2014, lives in [place] / Pulmonary Flowsheets:   ACT:      No data to display          MMRC:     No data to display          Epworth:      No data to display          Tests:   FENO:  No results found for: "NITRICOXIDE"  PFT:     No data to display          WALK:      No data to display          Imaging: Personally reviewed CT Chest W Contrast  Result Date: 03/06/2022 CLINICAL DATA:  Shortness of breath, pleural effusion EXAM: CT CHEST WITH CONTRAST TECHNIQUE: Multidetector CT imaging of the chest was performed during intravenous contrast administration. RADIATION DOSE REDUCTION: This exam was performed according to the departmental dose-optimization program which includes automated exposure control, adjustment of the mA and/or kV according to patient size and/or use of iterative reconstruction technique. CONTRAST:  85mL OMNIPAQUE IOHEXOL 300 MG/ML  SOLN COMPARISON:  11/11/2020 FINDINGS: Cardiovascular: Left innominate vein stenosis secondary to anterior mediastinal adenopathy. Heart size normal. No pericardial effusion. Extensive coronary calcifications, post CABG. Calcified plaque in the arch and descending thoracic aorta. Mediastinum/Nodes: Bulky right hilar, contiguous 2.6 cm right paratracheal, confluent 4.6 cm subcarinal, 2.6 cm AP window and 2.3 cm anterior mediastinal adenopathy, new since previous. There is a 1.1 cm borderline enlarged right supraclavicular node (Im17,Se2) .  Lungs/Pleura: 3 right lower lobe nodules the largest subpleural measuring 2 cm maximum diameter (Im30,Se6) . Interstitial thickening peripherally at the right lung base. Left lung clear. Upper Abdomen: No adrenal nodule. Atheromatous aorta. No acute findings. Musculoskeletal: Sternotomy wires. Cervical fixation hardware partially visualized. Spondylitic changes in the lower thoracic spine. No acute findings.  IMPRESSION: 1. Right lower lobe pulmonary nodules, bulky right hilar and mediastinal adenopathy suggesting metastatic carcinoma. 2. Moderate layering right pleural effusion 3. Coronary and Aortic Atherosclerosis (ICD10-170.0). Electronically Signed   By: Lucrezia Europe M.D.   On: 03/06/2022 11:09   DG Chest 2 View  Result Date: 02/20/2022 CLINICAL DATA:  Shortness of breath EXAM: CHEST - 2 VIEW COMPARISON:  02/04/2022 FINDINGS: The heart size and mediastinal contours are within normal limits. Prior sternotomy and CABG. Aortic atherosclerosis. Trace right pleural effusion, unchanged. Left lung is clear. No pneumothorax. The visualized skeletal structures are unremarkable. IMPRESSION: Unchanged trace right pleural effusion. Electronically Signed   By: Davina Poke D.O.   On: 02/20/2022 10:46   ECHOCARDIOGRAM COMPLETE  Result Date: 02/12/2022    ECHOCARDIOGRAM REPORT   Patient Name:   Harry STEWART Sr. Date of Exam: 02/12/2022 Medical Rec #:  [id number]            Height:       72.0 in Accession #:    [id number]           Weight:       190.2 lb Date of Birth:  10/07/1940            BSA:          2.086 m Patient Age:    42 years             BP:           137/83 mmHg Patient Gender: M                    HR:           84 bpm. Exam Location:  [facility] Procedure: 2D Echo, Cardiac Doppler and Color Doppler Indications:    Dyspnea upon exertion  History:        Patient has prior history of Echocardiogram examinations, most                 recent 06/11/2020. CAD, Prior CABG, Arrythmias:Atrial                 Fibrillation, Signs/Symptoms:Fatigue and Edema; Risk                 Factors:Diabetes, Hypertension and Sleep Apnea.  Sonographer:    Merrie Roof RDCS Referring Phys: [id number] [place]  1. Left ventricular ejection fraction, by estimation, is 55 to 60%. The left ventricle has normal function. The left ventricle has no regional wall motion abnormalities. Left ventricular diastolic parameters are  indeterminate.  2. Right ventricular systolic function is normal. The right ventricular size is normal.  3. The mitral valve is normal in structure. Mild mitral valve regurgitation. No evidence of mitral stenosis.  4. The aortic valve is normal in structure. Aortic valve regurgitation is mild. Aortic valve sclerosis/calcification is present, without any evidence of aortic stenosis.  5. Aneurysm of the ascending aorta, measuring 41 mm.  6. The inferior vena cava is normal in size with greater than 50% respiratory variability, suggesting right atrial pressure of 3 mmHg. FINDINGS  Left Ventricle: Left ventricular ejection fraction, by estimation, is 55  to 60%. The left ventricle has normal function. The left ventricle has no regional wall motion abnormalities. The left ventricular internal cavity size was normal in size. There is  no left ventricular hypertrophy. Left ventricular diastolic parameters are indeterminate. Right Ventricle: The right ventricular size is normal. No increase in right ventricular wall thickness. Right ventricular systolic function is normal. Left Atrium: Left atrial size was normal in size. Right Atrium: Right atrial size was normal in size. Pericardium: There is no evidence of pericardial effusion. Mitral Valve: The mitral valve is normal in structure. Mild mitral annular calcification. Mild mitral valve regurgitation. No evidence of mitral valve stenosis. Tricuspid Valve: The tricuspid valve is normal in structure. Tricuspid valve regurgitation is not demonstrated. No evidence of tricuspid stenosis. Aortic Valve: The aortic valve is normal in structure. Aortic valve regurgitation is mild. Aortic regurgitation PHT measures 443 msec. Aortic valve sclerosis/calcification is present, without any evidence of aortic stenosis. Aortic valve mean gradient measures 5.0 mmHg. Aortic valve peak gradient measures 9.7 mmHg. Aortic valve area, by VTI measures 1.53 cm. Pulmonic Valve: The pulmonic valve  was normal in structure. Pulmonic valve regurgitation is not visualized. No evidence of pulmonic stenosis. Aorta: The aortic root is normal in size and structure. There is an aneurysm involving the ascending aorta measuring 41 mm. Venous: The inferior vena cava is normal in size with greater than 50% respiratory variability, suggesting right atrial pressure of 3 mmHg. IAS/Shunts: No atrial level shunt detected by color flow Doppler.  LEFT VENTRICLE PLAX 2D LVIDd:         4.70 cm LVIDs:         3.40 cm LV PW:         1.00 cm LV IVS:        1.10 cm LVOT diam:     2.10 cm LV SV:         45 LV SV Index:   21 LVOT Area:     3.46 cm  RIGHT VENTRICLE RV Basal diam:  3.20 cm RV S prime:     10.40 cm/s TAPSE (M-mode): 1.5 cm LEFT ATRIUM             Index        RIGHT ATRIUM           Index LA diam:        4.70 cm 2.25 cm/m   RA Area:     20.10 cm LA Vol (A2C):   68.9 ml 33.04 ml/m  RA Volume:   60.80 ml  29.15 ml/m LA Vol (A4C):   64.7 ml 31.02 ml/m LA Biplane Vol: 67.2 ml 32.22 ml/m  AORTIC VALVE AV Area (Vmax):    1.61 cm AV Area (Vmean):   1.68 cm AV Area (VTI):     1.53 cm AV Vmax:           156.00 cm/s AV Vmean:          108.000 cm/s AV VTI:            0.292 m AV Peak Grad:      9.7 mmHg AV Mean Grad:      5.0 mmHg LVOT Vmax:         72.50 cm/s LVOT Vmean:        52.500 cm/s LVOT VTI:          0.129 m LVOT/AV VTI ratio: 0.44 AI PHT:            443 msec  AORTA Ao Root diam: 3.40 cm Ao Asc diam:  4.10 cm  SHUNTS Systemic VTI:  0.13 m Systemic Diam: 2.10 cm Jenne Campus MD Electronically signed by Jenne Campus MD Signature Date/Time: 02/12/2022/12:32:32 PM    Final     Lab Results:  CBC    Component Value Date/Time   WBC 8.5 02/18/2022 1509   RBC 4.16 (L) 02/18/2022 1509   HGB 12.8 (L) 02/18/2022 1509   HGB 12.5 (L) 02/04/2022 0921   HCT 38.8 (L) 02/18/2022 1509   HCT 38.1 02/04/2022 0921   PLT 235.0 02/18/2022 1509   PLT 248 02/04/2022 0921   MCV 93.2 02/18/2022 1509   MCV 94 02/04/2022  0921   MCH 30.7 02/04/2022 0921   MCH 30.6 06/13/2020 0455   MCHC 32.9 02/18/2022 1509   RDW 14.5 02/18/2022 1509   RDW 13.0 02/04/2022 0921   LYMPHSABS 2.2 02/18/2022 1509   MONOABS 0.9 02/18/2022 1509   EOSABS 0.3 02/18/2022 1509   BASOSABS 0.1 02/18/2022 1509    BMET    Component Value Date/Time   NA 139 02/18/2022 1509   NA 139 02/04/2022 0921   K 4.5 02/18/2022 1509   CL 103 02/18/2022 1509   CO2 27 02/18/2022 1509   GLUCOSE 87 02/18/2022 1509   BUN 21 02/18/2022 1509   BUN 21 02/04/2022 0921   CREATININE 1.06 02/18/2022 1509   CREATININE 1.16 (H) 04/03/2020 1044   CALCIUM 9.8 02/18/2022 1509   GFRNONAA >60 06/13/2020 0455   GFRAA >60 11/22/2019 1602    BNP No results found for: "BNP"  ProBNP    Component Value Date/Time   PROBNP 169.0 (H) 02/18/2022 1509    Specialty Problems       Pulmonary Problems   Sleep apnea   Pleural effusion, malignant    Allergies  Allergen Reactions   Adhesive [Tape] Rash   Latex Rash   Other Hives, Swelling and Rash    Plastic shopping bags Metal staples: rash, swelling    Immunization History  Administered Date(s) Administered   Fluad Quad(high Dose 65+) 04/20/2019, 04/03/2020, 04/13/2021   Influenza Split 03/31/2012   Influenza, High Dose Seasonal PF 03/20/2013, 02/24/2015, 03/11/2016, 03/15/2017, 05/02/2018   Influenza,inj,Quad PF,6+ Mos 01/29/2014   Influenza,trivalent, recombinat, inj, PF 04/06/2011   PFIZER(Purple Top)SARS-COV-2 Vaccination 06/27/2019, 07/18/2019, 03/14/2020   Pfizer Covid-19 Vaccine Bivalent Booster 59yrs & up 04/13/2021   Pneumococcal Conjugate-13 03/20/2013   Pneumococcal Polysaccharide-23 10/11/2004, 02/24/2015   Td 10/12/2003, 06/07/2009   Td (Adult), 2 Lf Tetanus Toxid, Preservative Free 10/12/2003    Past Medical History:  Diagnosis Date   Anemia 09/24/2013   CAD (coronary artery disease)    Cellulitis 05/14/2013   RLE   Diverticulitis    ED (erectile dysfunction)    Glaucoma  08/18/2014   History of sleep apnea    had surgery to correct   Hypertension    Hypothyroidism    Medicare annual wellness visit, subsequent 04/13/2013   Myocardial infarction ([place] [facility]) 11/05/1985   Nocturia 03/15/2017   Other and unspecified hyperlipidemia    Penile lesion 02/24/2015   Peripheral neuropathy 03/31/2012   Peripheral vascular disease ([place])    Postoperative anemia due to acute blood loss 09/24/2013   Preventative health care 03/13/2016   Sleep apnea 09/16/2017   Type II diabetes mellitus ([facility])    fasting 100-120   Urinary urgency 11/22/2012    Tobacco History: Social History   Tobacco Use  Smoking Status Former   Packs/day: 1.50   Years:  50.00   Total pack years: 75.00   Types: Cigarettes  Smokeless Tobacco Never  Tobacco Comments   05/14/2013 "quit smoking in ~ 2004; still Uses Comit lozenges"   Counseling given: Not Answered Tobacco comments: 05/14/2013 "quit smoking in ~ 2004; still Uses Comit lozenges"   Continue to not smoke  Outpatient Encounter Medications as of 03/10/2022  Medication Sig   atenolol (TENORMIN) 25 MG tablet Take 1 tablet (25 mg total) by mouth daily.   atorvastatin (LIPITOR) 40 MG tablet Take 1 tablet (40 mg total) by mouth daily.   Cholecalciferol (VITAMIN D) 50 MCG (2000 UT) tablet Take 2,000 Units by mouth daily.    ELIQUIS 5 MG TABS tablet TAKE ONE (1) TABLET BY MOUTH TWO (2) TIMES DAILY   latanoprost (XALATAN) 0.005 % ophthalmic solution Place 1 drop into both eyes at bedtime.    levothyroxine (SYNTHROID) 75 MCG tablet TAKE ONE (1) TABLET BY MOUTH EVERY DAY   loratadine (CLARITIN) 10 MG tablet Take 10 mg by mouth daily as needed for allergies.   losartan (COZAAR) 25 MG tablet Take 1 tablet (25 mg total) by mouth daily.   metFORMIN (GLUCOPHAGE-XR) 500 MG 24 hr tablet TAKE TWO (2) TABLETS BY MOUTH EVERY MORNING WITH BREAKFAST   ONETOUCH DELICA LANCETS 81X MISC USE TO CHECK BLOOD SUGAR ONCE DAILY   ONETOUCH ULTRA test strip USE 1 STRIP DAILY    tamsulosin (FLOMAX) 0.4 MG CAPS capsule Take 1 capsule (0.4 mg total) by mouth daily.   vitamin B-12 (CYANOCOBALAMIN) 1000 MCG tablet Take 1,000 mcg by mouth daily.   nitroGLYCERIN (NITROSTAT) 0.4 MG SL tablet Place 1 tablet (0.4 mg total) under the tongue every 5 (five) minutes as needed for chest pain.   No facility-administered encounter medications on file as of 03/10/2022.     Review of Systems  Review of Systems  No chest pain with exertion.  No orthopnea or PND.  Comprehensive review of systems otherwise negative. Physical Exam  BP 120/68 (BP Location: Left Arm, Patient Position: Sitting, Cuff Size: Normal)   Pulse 88   Temp 98.4 F (36.9 C) (Oral)   Ht 6' (1.829 m)   Wt 184 lb 9.6 oz (83.7 kg)   SpO2 99%   BMI 25.04 kg/m   Wt Readings from Last 5 Encounters:  03/10/22 184 lb 9.6 oz (83.7 kg)  02/04/22 190 lb 3.2 oz (86.3 kg)  01/26/22 191 lb (86.6 kg)  09/04/21 193 lb 9.6 oz (87.8 kg)  08/25/21 194 lb (88 kg)    BMI Readings from Last 5 Encounters:  03/10/22 25.04 kg/m  02/04/22 25.80 kg/m  01/26/22 25.90 kg/m  09/04/21 26.26 kg/m  08/25/21 26.31 kg/m     Physical Exam General: Sitting in chair, no acute distress Eyes: EOMI, no icterus Neck: Supple, no JVP Pulmonary: Diminished in the right base, otherwise clear, normal work of breathing Cardiovascular: Warm, no edema Abdomen: Nondistended, bowel sounds present MSK: No synovitis, no joint effusion Neuro: Normal gait, no weakness Psych: Normal mood, full affect   Assessment & Plan:   Mediastinal lymphadenopathy/mass with right-sided lung nodules and right pleural effusion: All concerning for sequela of primary lung malignancy with high concern for widely metastatic spread.  Thoracentesis and EBUS with biopsy for further evaluation, tentatively planned 03/16/2022.  Last dose Eliquis p.m. 03/13/2022 explained in detail to patient.  Dyspnea on exertion: Suspect largely related to development of moderate  to large right pleural effusion.  Plan thoracentesis same day as EBUS.  As above.  Assess response.  Case and plan of discussed with Dr. Valeta Harms and Dr. Tamala Julian.  We will try to arrange for Dr. Tamala Julian to perform procedures next week as I am unavailable.  Return in about 4 weeks (around 04/07/2022).   Lanier Clam, MD 03/10/2022   This appointment required 90 minutes of patient care (this includes precharting, chart review, review of results, face-to-face care, etc.).

## 2022-03-10 NOTE — Progress Notes (Signed)
@Patient  ID: Harry Brown., male    DOB: December 20, 1940, 81 y.o.   MRN: [id number]  Chief Complaint  Patient presents with   Consult    Consult for pleural effusion on the right side. Pt states this was diagnosed in August by cardiologist by xray. Pt states he is really SOB. CT scan done 9/28 and echocardiogram 9/8. No fluid medications noted from patient. Pt is currently on Eliquis twice a day. Pt noted dry cough for a few months now. No OTC medications noted.     Referring provider: [name], MD  HPI:   81 y.o. man whom are seen in consultation for evaluation of mediastinal mass and right-sided pleural effusion.  Recent PCP note reviewed.  Patient and wife present for visit today.  There is some weakness that developed maybe 3-4 months ago.  At 2 months ago he felt onset of shortness of breath.  Particularly dyspnea with exertion.  Progressed to minimal exertion causing significant shortness of breath.  This prompted ongoing cardiac evaluation.  This was some concern on exam for diminished breath sounds on the right base.  My review and interpretation chest x-ray 02/04/22 showed small right pleural effusion.  From my review interpretation chest x-ray okay 02/18/22 reveals similar to slightly smaller pleural effusion. This prompted CT scan 03/04/22 which revealed on my review and interpretation large mediastinal mass right paratracheal extending to right hilum all the way to the subcarinal area with a couple of notable right-sided nodules and a moderate right pleural effusion.  This prompted pulmonary consultation.  He smoked quite a lot.  Quit about 10 years ago.  75-pack-year history recorded.  No major issues of breathing prior to this.  PMH: Tobacco abuse in remission, atrial fibrillation on Eliquis, hypertension Surgical history: Cataract surgery, CABG, femoropopliteal bypass, tonsillectomy Family history: Mother with hyperlipidemia, CAD, diabetes, hypertension, father with CAD,  asthma Social history: Former smoker, 75-pack-year, quit 2014, lives in [place] / Pulmonary Flowsheets:   ACT:      No data to display          MMRC:     No data to display          Epworth:      No data to display          Tests:   FENO:  No results found for: "NITRICOXIDE"  PFT:     No data to display          WALK:      No data to display          Imaging: Personally reviewed CT Chest W Contrast  Result Date: 03/06/2022 CLINICAL DATA:  Shortness of breath, pleural effusion EXAM: CT CHEST WITH CONTRAST TECHNIQUE: Multidetector CT imaging of the chest was performed during intravenous contrast administration. RADIATION DOSE REDUCTION: This exam was performed according to the departmental dose-optimization program which includes automated exposure control, adjustment of the mA and/or kV according to patient size and/or use of iterative reconstruction technique. CONTRAST:  30mL OMNIPAQUE IOHEXOL 300 MG/ML  SOLN COMPARISON:  11/11/2020 FINDINGS: Cardiovascular: Left innominate vein stenosis secondary to anterior mediastinal adenopathy. Heart size normal. No pericardial effusion. Extensive coronary calcifications, post CABG. Calcified plaque in the arch and descending thoracic aorta. Mediastinum/Nodes: Bulky right hilar, contiguous 2.6 cm right paratracheal, confluent 4.6 cm subcarinal, 2.6 cm AP window and 2.3 cm anterior mediastinal adenopathy, new since previous. There is a 1.1 cm borderline enlarged right supraclavicular node (Im17,Se2) .  Lungs/Pleura: 3 right lower lobe nodules the largest subpleural measuring 2 cm maximum diameter (Im30,Se6) . Interstitial thickening peripherally at the right lung base. Left lung clear. Upper Abdomen: No adrenal nodule. Atheromatous aorta. No acute findings. Musculoskeletal: Sternotomy wires. Cervical fixation hardware partially visualized. Spondylitic changes in the lower thoracic spine. No acute findings.  IMPRESSION: 1. Right lower lobe pulmonary nodules, bulky right hilar and mediastinal adenopathy suggesting metastatic carcinoma. 2. Moderate layering right pleural effusion 3. Coronary and Aortic Atherosclerosis (ICD10-170.0). Electronically Signed   By: Lucrezia Europe M.D.   On: 03/06/2022 11:09   DG Chest 2 View  Result Date: 02/20/2022 CLINICAL DATA:  Shortness of breath EXAM: CHEST - 2 VIEW COMPARISON:  02/04/2022 FINDINGS: The heart size and mediastinal contours are within normal limits. Prior sternotomy and CABG. Aortic atherosclerosis. Trace right pleural effusion, unchanged. Left lung is clear. No pneumothorax. The visualized skeletal structures are unremarkable. IMPRESSION: Unchanged trace right pleural effusion. Electronically Signed   By: Davina Poke D.O.   On: 02/20/2022 10:46   ECHOCARDIOGRAM COMPLETE  Result Date: 02/12/2022    ECHOCARDIOGRAM REPORT   Patient Name:   Harry TALLO Sr. Date of Exam: 02/12/2022 Medical Rec #:  [id number]            Height:       72.0 in Accession #:    [id number]           Weight:       190.2 lb Date of Birth:  18-Nov-1940            BSA:          2.086 m Patient Age:    85 years             BP:           137/83 mmHg Patient Gender: M                    HR:           84 bpm. Exam Location:  [facility] Procedure: 2D Echo, Cardiac Doppler and Color Doppler Indications:    Dyspnea upon exertion  History:        Patient has prior history of Echocardiogram examinations, most                 recent 06/11/2020. CAD, Prior CABG, Arrythmias:Atrial                 Fibrillation, Signs/Symptoms:Fatigue and Edema; Risk                 Factors:Diabetes, Hypertension and Sleep Apnea.  Sonographer:    Merrie Roof RDCS Referring Phys: [id number] [place]  1. Left ventricular ejection fraction, by estimation, is 55 to 60%. The left ventricle has normal function. The left ventricle has no regional wall motion abnormalities. Left ventricular diastolic parameters are  indeterminate.  2. Right ventricular systolic function is normal. The right ventricular size is normal.  3. The mitral valve is normal in structure. Mild mitral valve regurgitation. No evidence of mitral stenosis.  4. The aortic valve is normal in structure. Aortic valve regurgitation is mild. Aortic valve sclerosis/calcification is present, without any evidence of aortic stenosis.  5. Aneurysm of the ascending aorta, measuring 41 mm.  6. The inferior vena cava is normal in size with greater than 50% respiratory variability, suggesting right atrial pressure of 3 mmHg. FINDINGS  Left Ventricle: Left ventricular ejection fraction, by estimation, is 55  to 60%. The left ventricle has normal function. The left ventricle has no regional wall motion abnormalities. The left ventricular internal cavity size was normal in size. There is  no left ventricular hypertrophy. Left ventricular diastolic parameters are indeterminate. Right Ventricle: The right ventricular size is normal. No increase in right ventricular wall thickness. Right ventricular systolic function is normal. Left Atrium: Left atrial size was normal in size. Right Atrium: Right atrial size was normal in size. Pericardium: There is no evidence of pericardial effusion. Mitral Valve: The mitral valve is normal in structure. Mild mitral annular calcification. Mild mitral valve regurgitation. No evidence of mitral valve stenosis. Tricuspid Valve: The tricuspid valve is normal in structure. Tricuspid valve regurgitation is not demonstrated. No evidence of tricuspid stenosis. Aortic Valve: The aortic valve is normal in structure. Aortic valve regurgitation is mild. Aortic regurgitation PHT measures 443 msec. Aortic valve sclerosis/calcification is present, without any evidence of aortic stenosis. Aortic valve mean gradient measures 5.0 mmHg. Aortic valve peak gradient measures 9.7 mmHg. Aortic valve area, by VTI measures 1.53 cm. Pulmonic Valve: The pulmonic valve  was normal in structure. Pulmonic valve regurgitation is not visualized. No evidence of pulmonic stenosis. Aorta: The aortic root is normal in size and structure. There is an aneurysm involving the ascending aorta measuring 41 mm. Venous: The inferior vena cava is normal in size with greater than 50% respiratory variability, suggesting right atrial pressure of 3 mmHg. IAS/Shunts: No atrial level shunt detected by color flow Doppler.  LEFT VENTRICLE PLAX 2D LVIDd:         4.70 cm LVIDs:         3.40 cm LV PW:         1.00 cm LV IVS:        1.10 cm LVOT diam:     2.10 cm LV SV:         45 LV SV Index:   21 LVOT Area:     3.46 cm  RIGHT VENTRICLE RV Basal diam:  3.20 cm RV S prime:     10.40 cm/s TAPSE (M-mode): 1.5 cm LEFT ATRIUM             Index        RIGHT ATRIUM           Index LA diam:        4.70 cm 2.25 cm/m   RA Area:     20.10 cm LA Vol (A2C):   68.9 ml 33.04 ml/m  RA Volume:   60.80 ml  29.15 ml/m LA Vol (A4C):   64.7 ml 31.02 ml/m LA Biplane Vol: 67.2 ml 32.22 ml/m  AORTIC VALVE AV Area (Vmax):    1.61 cm AV Area (Vmean):   1.68 cm AV Area (VTI):     1.53 cm AV Vmax:           156.00 cm/s AV Vmean:          108.000 cm/s AV VTI:            0.292 m AV Peak Grad:      9.7 mmHg AV Mean Grad:      5.0 mmHg LVOT Vmax:         72.50 cm/s LVOT Vmean:        52.500 cm/s LVOT VTI:          0.129 m LVOT/AV VTI ratio: 0.44 AI PHT:            443 msec  AORTA Ao Root diam: 3.40 cm Ao Asc diam:  4.10 cm  SHUNTS Systemic VTI:  0.13 m Systemic Diam: 2.10 cm Jenne Campus MD Electronically signed by Jenne Campus MD Signature Date/Time: 02/12/2022/12:32:32 PM    Final     Lab Results:  CBC    Component Value Date/Time   WBC 8.5 02/18/2022 1509   RBC 4.16 (L) 02/18/2022 1509   HGB 12.8 (L) 02/18/2022 1509   HGB 12.5 (L) 02/04/2022 0921   HCT 38.8 (L) 02/18/2022 1509   HCT 38.1 02/04/2022 0921   PLT 235.0 02/18/2022 1509   PLT 248 02/04/2022 0921   MCV 93.2 02/18/2022 1509   MCV 94 02/04/2022  0921   MCH 30.7 02/04/2022 0921   MCH 30.6 06/13/2020 0455   MCHC 32.9 02/18/2022 1509   RDW 14.5 02/18/2022 1509   RDW 13.0 02/04/2022 0921   LYMPHSABS 2.2 02/18/2022 1509   MONOABS 0.9 02/18/2022 1509   EOSABS 0.3 02/18/2022 1509   BASOSABS 0.1 02/18/2022 1509    BMET    Component Value Date/Time   NA 139 02/18/2022 1509   NA 139 02/04/2022 0921   K 4.5 02/18/2022 1509   CL 103 02/18/2022 1509   CO2 27 02/18/2022 1509   GLUCOSE 87 02/18/2022 1509   BUN 21 02/18/2022 1509   BUN 21 02/04/2022 0921   CREATININE 1.06 02/18/2022 1509   CREATININE 1.16 (H) 04/03/2020 1044   CALCIUM 9.8 02/18/2022 1509   GFRNONAA >60 06/13/2020 0455   GFRAA >60 11/22/2019 1602    BNP No results found for: "BNP"  ProBNP    Component Value Date/Time   PROBNP 169.0 (H) 02/18/2022 1509    Specialty Problems       Pulmonary Problems   Sleep apnea   Pleural effusion, malignant    Allergies  Allergen Reactions   Adhesive [Tape] Rash   Latex Rash   Other Hives, Swelling and Rash    Plastic shopping bags Metal staples: rash, swelling    Immunization History  Administered Date(s) Administered   Fluad Quad(high Dose 65+) 04/20/2019, 04/03/2020, 04/13/2021   Influenza Split 03/31/2012   Influenza, High Dose Seasonal PF 03/20/2013, 02/24/2015, 03/11/2016, 03/15/2017, 05/02/2018   Influenza,inj,Quad PF,6+ Mos 01/29/2014   Influenza,trivalent, recombinat, inj, PF 04/06/2011   PFIZER(Purple Top)SARS-COV-2 Vaccination 06/27/2019, 07/18/2019, 03/14/2020   Pfizer Covid-19 Vaccine Bivalent Booster 62yrs & up 04/13/2021   Pneumococcal Conjugate-13 03/20/2013   Pneumococcal Polysaccharide-23 10/11/2004, 02/24/2015   Td 10/12/2003, 06/07/2009   Td (Adult), 2 Lf Tetanus Toxid, Preservative Free 10/12/2003    Past Medical History:  Diagnosis Date   Anemia 09/24/2013   CAD (coronary artery disease)    Cellulitis 05/14/2013   RLE   Diverticulitis    ED (erectile dysfunction)    Glaucoma  08/18/2014   History of sleep apnea    had surgery to correct   Hypertension    Hypothyroidism    Medicare annual wellness visit, subsequent 04/13/2013   Myocardial infarction ([place]) 11/05/1985   Nocturia 03/15/2017   Other and unspecified hyperlipidemia    Penile lesion 02/24/2015   Peripheral neuropathy 03/31/2012   Peripheral vascular disease ([place])    Postoperative anemia due to acute blood loss 09/24/2013   Preventative health care 03/13/2016   Sleep apnea 09/16/2017   Type II diabetes mellitus ([facility])    fasting 100-120   Urinary urgency 11/22/2012    Tobacco History: Social History   Tobacco Use  Smoking Status Former   Packs/day: 1.50   Years:  50.00   Total pack years: 75.00   Types: Cigarettes  Smokeless Tobacco Never  Tobacco Comments   05/14/2013 "quit smoking in ~ 2004; still Uses Comit lozenges"   Counseling given: Not Answered Tobacco comments: 05/14/2013 "quit smoking in ~ 2004; still Uses Comit lozenges"   Continue to not smoke  Outpatient Encounter Medications as of 03/10/2022  Medication Sig   atenolol (TENORMIN) 25 MG tablet Take 1 tablet (25 mg total) by mouth daily.   atorvastatin (LIPITOR) 40 MG tablet Take 1 tablet (40 mg total) by mouth daily.   Cholecalciferol (VITAMIN D) 50 MCG (2000 UT) tablet Take 2,000 Units by mouth daily.    ELIQUIS 5 MG TABS tablet TAKE ONE (1) TABLET BY MOUTH TWO (2) TIMES DAILY   latanoprost (XALATAN) 0.005 % ophthalmic solution Place 1 drop into both eyes at bedtime.    levothyroxine (SYNTHROID) 75 MCG tablet TAKE ONE (1) TABLET BY MOUTH EVERY DAY   loratadine (CLARITIN) 10 MG tablet Take 10 mg by mouth daily as needed for allergies.   losartan (COZAAR) 25 MG tablet Take 1 tablet (25 mg total) by mouth daily.   metFORMIN (GLUCOPHAGE-XR) 500 MG 24 hr tablet TAKE TWO (2) TABLETS BY MOUTH EVERY MORNING WITH BREAKFAST   ONETOUCH DELICA LANCETS 16X MISC USE TO CHECK BLOOD SUGAR ONCE DAILY   ONETOUCH ULTRA test strip USE 1 STRIP DAILY    tamsulosin (FLOMAX) 0.4 MG CAPS capsule Take 1 capsule (0.4 mg total) by mouth daily.   vitamin B-12 (CYANOCOBALAMIN) 1000 MCG tablet Take 1,000 mcg by mouth daily.   nitroGLYCERIN (NITROSTAT) 0.4 MG SL tablet Place 1 tablet (0.4 mg total) under the tongue every 5 (five) minutes as needed for chest pain.   No facility-administered encounter medications on file as of 03/10/2022.     Review of Systems  Review of Systems  No chest pain with exertion.  No orthopnea or PND.  Comprehensive review of systems otherwise negative. Physical Exam  BP 120/68 (BP Location: Left Arm, Patient Position: Sitting, Cuff Size: Normal)   Pulse 88   Temp 98.4 F (36.9 C) (Oral)   Ht 6' (1.829 m)   Wt 184 lb 9.6 oz (83.7 kg)   SpO2 99%   BMI 25.04 kg/m   Wt Readings from Last 5 Encounters:  03/10/22 184 lb 9.6 oz (83.7 kg)  02/04/22 190 lb 3.2 oz (86.3 kg)  01/26/22 191 lb (86.6 kg)  09/04/21 193 lb 9.6 oz (87.8 kg)  08/25/21 194 lb (88 kg)    BMI Readings from Last 5 Encounters:  03/10/22 25.04 kg/m  02/04/22 25.80 kg/m  01/26/22 25.90 kg/m  09/04/21 26.26 kg/m  08/25/21 26.31 kg/m     Physical Exam General: Sitting in chair, no acute distress Eyes: EOMI, no icterus Neck: Supple, no JVP Pulmonary: Diminished in the right base, otherwise clear, normal work of breathing Cardiovascular: Warm, no edema Abdomen: Nondistended, bowel sounds present MSK: No synovitis, no joint effusion Neuro: Normal gait, no weakness Psych: Normal mood, full affect   Assessment & Plan:   Mediastinal lymphadenopathy/mass with right-sided lung nodules and right pleural effusion: All concerning for sequela of primary lung malignancy with high concern for widely metastatic spread.  Thoracentesis and EBUS with biopsy for further evaluation, tentatively planned 03/16/2022.  Last dose Eliquis p.m. 03/13/2022 explained in detail to patient.  Dyspnea on exertion: Suspect largely related to development of moderate  to large right pleural effusion.  Plan thoracentesis same day as EBUS.  As above.  Assess response.  Case and plan of discussed with Dr. Valeta Harms and Dr. Tamala Julian.  We will try to arrange for Dr. Tamala Julian to perform procedures next week as I am unavailable.  Return in about 4 weeks (around 04/07/2022).   Lanier Clam, MD 03/10/2022   This appointment required 90 minutes of patient care (this includes precharting, chart review, review of results, face-to-face care, etc.).

## 2022-03-11 ENCOUNTER — Inpatient Hospital Stay (HOSPITAL_BASED_OUTPATIENT_CLINIC_OR_DEPARTMENT_OTHER): Payer: Medicare Other | Admitting: Hematology & Oncology

## 2022-03-11 ENCOUNTER — Inpatient Hospital Stay: Payer: Medicare Other | Attending: Hematology & Oncology

## 2022-03-11 ENCOUNTER — Telehealth: Payer: Self-pay | Admitting: Pulmonary Disease

## 2022-03-11 ENCOUNTER — Other Ambulatory Visit: Payer: Self-pay

## 2022-03-11 ENCOUNTER — Encounter: Payer: Self-pay | Admitting: Hematology & Oncology

## 2022-03-11 ENCOUNTER — Encounter: Payer: Self-pay | Admitting: *Deleted

## 2022-03-11 VITALS — BP 129/56 | HR 88 | Temp 97.6°F | Resp 20 | Ht 72.0 in | Wt 186.0 lb

## 2022-03-11 DIAGNOSIS — Z87891 Personal history of nicotine dependence: Secondary | ICD-10-CM | POA: Diagnosis not present

## 2022-03-11 DIAGNOSIS — C7931 Secondary malignant neoplasm of brain: Secondary | ICD-10-CM | POA: Insufficient documentation

## 2022-03-11 DIAGNOSIS — J910 Malignant pleural effusion: Secondary | ICD-10-CM

## 2022-03-11 DIAGNOSIS — C3490 Malignant neoplasm of unspecified part of unspecified bronchus or lung: Secondary | ICD-10-CM

## 2022-03-11 DIAGNOSIS — Z01818 Encounter for other preprocedural examination: Secondary | ICD-10-CM

## 2022-03-11 DIAGNOSIS — R251 Tremor, unspecified: Secondary | ICD-10-CM | POA: Insufficient documentation

## 2022-03-11 LAB — CBC WITH DIFFERENTIAL (CANCER CENTER ONLY)
Abs Immature Granulocytes: 0.04 10*3/uL (ref 0.00–0.07)
Basophils Absolute: 0 10*3/uL (ref 0.0–0.1)
Basophils Relative: 0 %
Eosinophils Absolute: 0.3 10*3/uL (ref 0.0–0.5)
Eosinophils Relative: 4 %
HCT: 36 % — ABNORMAL LOW (ref 39.0–52.0)
Hemoglobin: 11.8 g/dL — ABNORMAL LOW (ref 13.0–17.0)
Immature Granulocytes: 1 %
Lymphocytes Relative: 21 %
Lymphs Abs: 1.7 10*3/uL (ref 0.7–4.0)
MCH: 30.4 pg (ref 26.0–34.0)
MCHC: 32.8 g/dL (ref 30.0–36.0)
MCV: 92.8 fL (ref 80.0–100.0)
Monocytes Absolute: 0.7 10*3/uL (ref 0.1–1.0)
Monocytes Relative: 9 %
Neutro Abs: 5.5 10*3/uL (ref 1.7–7.7)
Neutrophils Relative %: 65 %
Platelet Count: 215 10*3/uL (ref 150–400)
RBC: 3.88 MIL/uL — ABNORMAL LOW (ref 4.22–5.81)
RDW: 13.9 % (ref 11.5–15.5)
WBC Count: 8.3 10*3/uL (ref 4.0–10.5)
nRBC: 0 % (ref 0.0–0.2)

## 2022-03-11 LAB — PREALBUMIN: Prealbumin: 18 mg/dL (ref 18–38)

## 2022-03-11 LAB — CEA (IN HOUSE-CHCC): CEA (CHCC-In House): 6.55 ng/mL — ABNORMAL HIGH (ref 0.00–5.00)

## 2022-03-11 LAB — CMP (CANCER CENTER ONLY)
ALT: 18 U/L (ref 0–44)
AST: 23 U/L (ref 15–41)
Albumin: 4 g/dL (ref 3.5–5.0)
Alkaline Phosphatase: 81 U/L (ref 38–126)
Anion gap: 6 (ref 5–15)
BUN: 21 mg/dL (ref 8–23)
CO2: 26 mmol/L (ref 22–32)
Calcium: 9.6 mg/dL (ref 8.9–10.3)
Chloride: 105 mmol/L (ref 98–111)
Creatinine: 1.05 mg/dL (ref 0.61–1.24)
GFR, Estimated: >60 mL/min (ref >60)
Glucose, Bld: 188 mg/dL — ABNORMAL HIGH (ref 70–99)
Potassium: 4.6 mmol/L (ref 3.5–5.1)
Sodium: 137 mmol/L (ref 135–145)
Total Bilirubin: 0.6 mg/dL (ref 0.3–1.2)
Total Protein: 7 g/dL (ref 6.5–8.1)

## 2022-03-11 LAB — LACTATE DEHYDROGENASE: LDH: 141 U/L (ref 98–192)

## 2022-03-11 NOTE — Telephone Encounter (Signed)
Thoracentesis has already been scheduled for 2:00 on 10/10.  I called [facility] & spoke to [place].  EBUS is in depot but she has to check on time due to will need to start prior to 2:00.  She will call me back.  I went ahead and spoke to pt & made him aware he will need to get covid test in the office tomorrow & I will call him back about times for Tuesday.  Sent message to April for covid test order.

## 2022-03-11 NOTE — Progress Notes (Signed)
Initial RN Navigator Patient Visit  Name: Harry MATLACK Sr. Date of Referral : 03/08/2022 Diagnosis: Multiple Pulmonary Nodules consistent with mets  Met with patient prior to their visit with MD. [name] patient "Your Patient Navigator" handout which explains my role, areas in which I am able to help, and all the contact information for myself and the office. Also gave patient MD and Navigator business card. Reviewed with patient the general overview of expected course after initial diagnosis and time frame for all steps to be completed.  New patient packet given to patient which includes: orientation to office and staff; campus directory; education on My Chart and Advance Directives; and patient centered education on Cancer.  Patient comes with his wife and daughter. He is retired. Lives with his wife and states he has multiple supportive children and network. He is already scheduled for a bronch on Tuesday, 03/16/22.   Patient completed visit with Dr. Marin Olp.   At this time we will wait for tissue biopsy. Those results will need to be sent to [facility].   Patient understands all follow up procedures and expectations. They have my number to reach out for any further clarification or additional needs.    Oncology Nurse Navigator Documentation     03/11/2022   11:30 AM  Oncology Nurse Navigator Flowsheets  Navigator Follow Up Date: 03/16/2022  Navigator Follow Up Reason: Other:  Navigator Location [facility]  Navigator Encounter Type Initial MedOnc  Patient Visit Type MedOnc  Treatment Phase Abnormal Scans  Barriers/Navigation Needs Coordination of Care;Education  Interventions Education;Psycho-Social Support  Acuity Level 2-Minimal Needs (1-2 Barriers Identified)  Education Method Verbal;Written  Support Groups/Services Friends and Family  Time Spent with Patient 30

## 2022-03-11 NOTE — Telephone Encounter (Signed)
Please schedule the following:  Provider performing procedure:Dr. Erskine Emery Diagnosis: lung mass Which side if for nodule / mass? right Procedure: EBUS with TBNA  Has patient been spoken to by Provider and given informed consent? yes Anesthesia: general Do you need Fluro? no Duration of procedure: 1 hour Date: 03/16/22 Alternate Date: n/a  Time: whatever is available Location: [facility] Does patient have OSA? no DM? no Or Latex allergy? no Medication Restriction/ Anticoagulate/Antiplatelet: yes - eliquis - patient instructed to take last dose PM 03/13/22 Pre-op Labs Ordered:determined by Anesthesia Imaging request: n/a  (If, SuperDimension CT Chest, please have STAT courier sent to ENDO)  Please coordinate Pre-op COVID Testing

## 2022-03-11 NOTE — Progress Notes (Addendum)
Referral MD  Reason for Referral: Likely bronchogenic carcinoma- mediastinal adenopathy, right pleural effusion, lung nodules  Chief Complaint  Patient presents with   New Patient (Initial Visit)    " I have terrible shortness of breath"  : I probably have cancer.  HPI: Harry Brown is a really nice 81 year old white male.  He comes in with his wife and daughter.  They are extremely delightful to talk to.  He is resumed [place].  He has had multiple jobs.  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  He is now retired.  He does have history of tobacco use.  He probably has about a 50-pack-year history of tobacco use.  He stopped about 17 years ago.  He did have a coronary artery bypass back in 2001.  He has atrial fibrillation.  He is on anticoagulation.  He has been noticing some shortness of breath.  This been over 2 or 3 months..  He has had little bit of a cough.  Is been no hemoptysis.  His weights been holding pretty steady.  His appetite has been okay.  He has had no nausea or vomiting.  He ultimately saw his family doctor.  He had x-rays done.  He had a right pleural effusion.  He subsequently had chest x-ray done.  This was done on 02/04/2022.  Again, this showed a small right pleural effusion.  This was repeated on 02/18/2022.  Again, he had the right pleural effusion.  On 03/04/2022, he had a CT scan of the chest.  This, it showed the problem.  He had bulky right hilar adenopathy.  It measured 2.6 cm.  He had a 4.6 cm subcarinal lymph node.  He had multiple other mediastinal lymph nodes.  He had a borderline enlarged right supraclavicular lymph node.  He has 3 right lower lobe pulmonary nodules.  Otherwise, there is no disease elsewhere.  He actually saw Dr. Silas Flood of pulmonary medicine yesterday.  Dr. Silas Flood has set him up with a bronchoscopy.  He is going to have a thoracentesis on Tuesday.  There is a history of cancer in  his family.  I must say that his overall performance status is probably ECOG 1. :   Past Surgical History:  Procedure Laterality Date   ABDOMINAL AORTAGRAM  Oct. 9, 2014   Dr. Bridgett Larsson   ABDOMINAL Maxcine Ham N/A 03/15/2013   Procedure: ABDOMINAL Maxcine Ham;  Surgeon: Conrad Mayo, MD;  Location: [facility] CATH LAB;  Service: Cardiovascular;  Laterality: N/A;   ANTERIOR CERVICAL DECOMP/DISCECTOMY FUSION  ~ 2000   CARDIOVERSION N/A 12/03/2019   Procedure: CARDIOVERSION;  Surgeon: Acie Fredrickson, Wonda Cheng, MD;  Location: [facility] ENDOSCOPY;  Service: Cardiovascular;  Laterality: N/A;   CATARACT EXTRACTION Right    CORONARY ARTERY BYPASS GRAFT  2001   "CABG X3" (05/14/2013)   DENTAL SURGERY     "multiple teeth removed; trmimed top of mouth so dentures would fit" (05/14/2013)   EYE SURGERY     cataracts   FEMORAL-POPLITEAL BYPASS GRAFT Right 04/25/2013   Procedure: RIGHT FEMORAL TO ABOVE KNEE POPLITEAL BYPASS GRAFT WITH RIGHT [place]; ULTRASOUND GUIDED;  Surgeon: Conrad Dixon, MD;  Location: [facility] OR;  Service: Vascular;  Laterality: Right;   GUM SURGERY  [phone]   "had bone scraped; had periodontal disease" (05/14/2013)   PALATE / UVULA BIOPSY / EXCISION  2003   Removed due to sleep apnea   TEE WITHOUT CARDIOVERSION  N/A 06/13/2020   Procedure: TRANSESOPHAGEAL ECHOCARDIOGRAM (TEE);  Surgeon: Geralynn Rile, MD;  Location: [facility] ENDOSCOPY;  Service: Cardiovascular;  Laterality: N/A;   TONSILLECTOMY    :   Current Outpatient Medications:    atenolol (TENORMIN) 25 MG tablet, Take 1 tablet (25 mg total) by mouth daily., Disp: 90 tablet, Rfl: 0   atorvastatin (LIPITOR) 40 MG tablet, Take 1 tablet (40 mg total) by mouth daily., Disp: 90 tablet, Rfl: 0   Cholecalciferol (VITAMIN D) 50 MCG (2000 UT) tablet, Take 2,000 Units by mouth daily. , Disp: , Rfl:    ELIQUIS 5 MG TABS tablet, TAKE ONE (1) TABLET BY MOUTH TWO (2) TIMES DAILY, Disp: 60 tablet, Rfl: 5   latanoprost (XALATAN) 0.005 % ophthalmic  solution, Place 1 drop into both eyes at bedtime. , Disp: , Rfl:    levothyroxine (SYNTHROID) 75 MCG tablet, TAKE ONE (1) TABLET BY MOUTH EVERY DAY, Disp: 90 tablet, Rfl: 0   loratadine (CLARITIN) 10 MG tablet, Take 10 mg by mouth daily as needed for allergies., Disp: , Rfl:    losartan (COZAAR) 25 MG tablet, Take 1 tablet (25 mg total) by mouth daily., Disp: 90 tablet, Rfl: 1   metFORMIN (GLUCOPHAGE-XR) 500 MG 24 hr tablet, TAKE TWO (2) TABLETS BY MOUTH EVERY MORNING WITH BREAKFAST, Disp: 180 tablet, Rfl: 1   ONETOUCH DELICA LANCETS 26Z MISC, USE TO CHECK BLOOD SUGAR ONCE DAILY, Disp: 100 each, Rfl: 1   ONETOUCH ULTRA test strip, USE 1 STRIP DAILY, Disp: 100 each, Rfl: 12   tamsulosin (FLOMAX) 0.4 MG CAPS capsule, Take 1 capsule (0.4 mg total) by mouth daily., Disp: 90 capsule, Rfl: 1   vitamin B-12 (CYANOCOBALAMIN) 1000 MCG tablet, Take 1,000 mcg by mouth daily., Disp: , Rfl:    nitroGLYCERIN (NITROSTAT) 0.4 MG SL tablet, Place 1 tablet (0.4 mg total) under the tongue every 5 (five) minutes as needed for chest pain., Disp: 25 tablet, Rfl: 11:  :   Allergies  Allergen Reactions   Adhesive [Tape] Rash   Latex Rash   Other Hives, Swelling and Rash    Plastic shopping bags Metal staples: rash, swelling  :   Family History  Problem Relation Age of Onset   Hyperlipidemia Mother    Heart disease Mother    Diabetes Mother    Hypertension Mother    Heart disease Father    Asthma Father    Arthritis Father    Heart disease Sister    Heart disease Sister        s/p bypass x 2   Lupus Sister    Heart disease Brother        MI waiting on heart transplant when died   Heart disease Brother        massive MI   Cancer Neg Hx   :   Social History   Socioeconomic History   Marital status: Married    Spouse name: Not on file   Number of children: 8    Years of education: Not on file   Highest education level: Not on file  Occupational History    Employer: [employer]   Tobacco Use   Smoking status: Former    Packs/day: 1.50    Years: 50.00    Total pack years: 75.00    Types: Cigarettes   Smokeless tobacco: Never   Tobacco comments:    05/14/2013 "quit smoking in ~ 2004; still Uses Comit lozenges"  Vaping Use   Vaping Use:  Never used  Substance and Sexual Activity   Alcohol use: No    Alcohol/week: 0.0 standard drinks of alcohol   Drug use: No   Sexual activity: Yes    Comment: lives with wife, retiring end of next week, no dietary restrictions  Other Topics Concern   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 [employer]   Social Determinants of Health   Financial Resource Strain: Medium Risk (04/09/2021)   Overall Financial Resource Strain (CARDIA)    Difficulty of Paying Living Expenses: Somewhat hard  Food Insecurity: No Food Insecurity (03/25/2021)   Hunger Vital Sign    Worried About Running Out of Food in the Last Year: Never true    [place]  in the Last Year: Never true  Transportation Needs: No Transportation Needs (03/25/2021)   PRAPARE - Hydrologist (Medical): No    Lack of Transportation (Non-Medical): No  Physical Activity: Inactive (10/07/2020)   Exercise Vital Sign    Days of Exercise per Week: 0 days    Minutes of Exercise per Session: 0 min  Stress: No Stress Concern Present (03/25/2021)   [place]    Feeling of Stress : Not at all  Social Connections: [place] (03/25/2021)   Social Connection and Isolation Panel [NHANES]    Frequency of Communication with Friends and Family: More than three times a week    Frequency of Social Gatherings with Friends and Family: More than three times a week    Attends Religious Services: More than 4 times per year    Active Member of [employer] or Organizations: Yes    Attends Archivist Meetings: More than 4  times per year    Marital Status: Married  Human resources officer Violence: Not At Risk (03/25/2021)   Humiliation, Afraid, Rape, and Kick questionnaire    Fear of Current or Ex-Partner: No    Emotionally Abused: No    Physically Abused: No    Sexually Abused: No  :  Review of Systems  Constitutional:  Positive for malaise/fatigue.  HENT: Negative.    Eyes: Negative.   Respiratory:  Positive for cough and shortness of breath.   Cardiovascular: Negative.   Gastrointestinal: Negative.   Genitourinary: Negative.   Musculoskeletal: Negative.   Skin: Negative.   Neurological: Negative.   Endo/Heme/Allergies: Negative.   Psychiatric/Behavioral: Negative.       Exam: Vital signs are temperature of 97.6.  Pulse 88.  Blood pressure 129/56.  Weight is 186 pounds.  @IPVITALS @ Physical Exam Vitals reviewed.  HENT:     Head: Normocephalic and atraumatic.  Eyes:     Pupils: Pupils are equal, round, and reactive to light.  Cardiovascular:     Rate and Rhythm: Normal rate and regular rhythm.     Heart sounds: Normal heart sounds.  Pulmonary:     Effort: Pulmonary effort is normal.     Breath sounds: Normal breath sounds.  Abdominal:     General: Bowel sounds are normal.     Palpations: Abdomen is soft.  Musculoskeletal:        General: No tenderness or deformity. Normal range of motion.     Cervical back: Normal range of motion.  Lymphadenopathy:     Cervical: No cervical adenopathy.  Skin:    General: Skin is warm and dry.     Findings: No erythema  or rash.  Neurological:     Mental Status: He is alert and oriented to person, place, and time.  Psychiatric:        Behavior: Behavior normal.        Thought Content: Thought content normal.        Judgment: Judgment normal.     Recent Labs    03/11/22 1032  WBC 8.3  HGB 11.8*  HCT 36.0*  PLT 215    Recent Labs    03/11/22 1032  NA 137  K 4.6  CL 105  CO2 26  GLUCOSE 188*  BUN 21  CREATININE 1.05  CALCIUM 9.6     Blood smear review: None  Pathology: Pending    Assessment and Plan: Mr. Stetson is a very nice 81 year old white male.  He has a past history of tobacco use.  He has chest CT scan findings which certainly are suggestive of a bronchogenic carcinoma.  We do not have a biopsy yet.  We will have to get a biopsy.  Again this may be done with the pleural effusion on the right side.  However, I think we probably would be best served by bronchoscopy with biopsies.  We really need tissue.  I would think that getting biopsies with a bronchoscopy should be fruitful as he has significant lymphadenopathy that is adjacent to his airway.  I do think that a PET scan is necessary.  By his scans right now, he has at least stage IIIb/IIIc disease.  I think a PET scan would help [place] determine if there is any disease elsewhere outside of the chest cavity.  I do think a MRI of the brain will be necessary..  When we get the biopsies, we will have to send them off for molecular analysis to see if he has any actionable mutations that we could target.  As far as recommendations for therapy, it is hard to make this right now given the fact that we do not know the histology.  I would think this would be a nonsmall cell lung cancer.  Particularly, I would think squamous cell carcinoma would be likely.  It is also possible this may be a small cell lung cancer.  Again, he is in good shape.  We can certainly be aggressive with his approach to therapy.  We will try to move things through as quickly as possible.  He will have the thoracentesis next week.  I would like to hope that a bronchoscopy will also be next week.  We will try to get the MRI in the PET scan the same day that he has the thoracentesis.  I spent a good hour more with he and his family.  They are all very nice.  I answered all their questions.  I gave him a prayer blanket.  They all have such a strong faith.  We shared our fellowship.  Once we have  biopsy results back and scan results back, we will get Mr. Advertising account planner and come up with a game plan for treatment.

## 2022-03-11 NOTE — Telephone Encounter (Signed)
Secure chat received from [place] stating Santiago Glad told her to tell me to call back in the morning and ask Larene [place] about this.

## 2022-03-12 ENCOUNTER — Other Ambulatory Visit: Payer: Self-pay

## 2022-03-12 ENCOUNTER — Other Ambulatory Visit: Payer: Medicare Other

## 2022-03-12 ENCOUNTER — Encounter: Payer: Self-pay | Admitting: Internal Medicine

## 2022-03-12 DIAGNOSIS — Z01818 Encounter for other preprocedural examination: Secondary | ICD-10-CM | POA: Diagnosis not present

## 2022-03-12 DIAGNOSIS — J910 Malignant pleural effusion: Secondary | ICD-10-CM

## 2022-03-12 MED ORDER — DIAZEPAM 5 MG PO TABS: 5.0000 mg | ORAL_TABLET | ORAL | 0 refills | Status: DC | PRN

## 2022-03-12 NOTE — Telephone Encounter (Signed)
Pt has been scheduled for 10/10 at 1:00 to get EBUS prior to thoracentesis per [facility].  Spoke to pt & gave him appt info and sent letter thru [place].  He is getting covid test in office today.  Nothing further needed.

## 2022-03-14 LAB — NOVEL CORONAVIRUS, NAA: SARS-CoV-2, NAA: NOT DETECTED

## 2022-03-14 LAB — SPECIMEN STATUS REPORT

## 2022-03-15 ENCOUNTER — Ambulatory Visit (HOSPITAL_COMMUNITY)
Admission: RE | Admit: 2022-03-15 | Discharge: 2022-03-15 | Disposition: A | Discharge status: Home / Self Care | Payer: Medicare Other | Source: Ambulatory Visit | Attending: Hematology & Oncology | Admitting: Hematology & Oncology

## 2022-03-15 ENCOUNTER — Encounter (HOSPITAL_COMMUNITY): Payer: Self-pay | Admitting: Internal Medicine

## 2022-03-15 DIAGNOSIS — C3490 Malignant neoplasm of unspecified part of unspecified bronchus or lung: Secondary | ICD-10-CM | POA: Insufficient documentation

## 2022-03-15 DIAGNOSIS — G9389 Other specified disorders of brain: Secondary | ICD-10-CM | POA: Diagnosis not present

## 2022-03-15 MED ORDER — GADOBUTROL 1 MMOL/ML IV SOLN
8.5000 mL | Freq: Once | INTRAVENOUS | Status: AC | PRN
  Administered 2022-03-15: 8.5 mL via INTRAVENOUS

## 2022-03-15 NOTE — Progress Notes (Signed)
Mr. Harry Brown denies chest pain, he does have shortness of breath due to fluid in his lungs. Patient denies having any s/s of Covid in his household, also denies any known exposure to Covid.   Mr. Harry Brown PCP is Dr. Penni Homans, cardiologist is Dr. Heron Sabins.  Mr. Harry Brown has type II diabetes.  Patient reports that CBGs run 109-140.  I told Mr. Harry Brown to hold Metformin in am. I instructed patient to check CBG after awaking and every 2 hours until arrival  to the hospital. I Instructed patient if CBG is less than 70 to take 4 Glucose Tablets or 1 tube of Glucose Gel or 1/2 cup of a clear juice. Recheck CBG in 15 minutes if CBG is not over 70 call, pre- op desk at [phone] for further instructions.

## 2022-03-16 ENCOUNTER — Ambulatory Visit (HOSPITAL_COMMUNITY): Payer: Medicare Other | Admitting: Anesthesiology

## 2022-03-16 ENCOUNTER — Ambulatory Visit (HOSPITAL_COMMUNITY)
Admission: RE | Admit: 2022-03-16 | Discharge: 2022-03-16 | Disposition: A | Discharge status: Home / Self Care | Payer: Medicare Other | Source: Ambulatory Visit | Attending: Internal Medicine | Admitting: Internal Medicine

## 2022-03-16 ENCOUNTER — Ambulatory Visit (HOSPITAL_COMMUNITY): Payer: Medicare Other

## 2022-03-16 ENCOUNTER — Encounter (HOSPITAL_COMMUNITY)
Admission: RE | Disposition: A | Discharge status: Home / Self Care | Payer: Self-pay | Source: Ambulatory Visit | Attending: Internal Medicine

## 2022-03-16 ENCOUNTER — Ambulatory Visit (HOSPITAL_BASED_OUTPATIENT_CLINIC_OR_DEPARTMENT_OTHER): Payer: Medicare Other | Admitting: Anesthesiology

## 2022-03-16 ENCOUNTER — Encounter: Payer: Self-pay | Admitting: *Deleted

## 2022-03-16 ENCOUNTER — Encounter (HOSPITAL_COMMUNITY): Payer: Self-pay | Admitting: Internal Medicine

## 2022-03-16 ENCOUNTER — Other Ambulatory Visit: Payer: Self-pay

## 2022-03-16 DIAGNOSIS — Z981 Arthrodesis status: Secondary | ICD-10-CM | POA: Diagnosis not present

## 2022-03-16 DIAGNOSIS — E785 Hyperlipidemia, unspecified: Secondary | ICD-10-CM | POA: Diagnosis not present

## 2022-03-16 DIAGNOSIS — Z79899 Other long term (current) drug therapy: Secondary | ICD-10-CM | POA: Insufficient documentation

## 2022-03-16 DIAGNOSIS — I10 Essential (primary) hypertension: Secondary | ICD-10-CM

## 2022-03-16 DIAGNOSIS — Z7901 Long term (current) use of anticoagulants: Secondary | ICD-10-CM | POA: Insufficient documentation

## 2022-03-16 DIAGNOSIS — R091 Pleurisy: Secondary | ICD-10-CM | POA: Diagnosis not present

## 2022-03-16 DIAGNOSIS — J910 Malignant pleural effusion: Secondary | ICD-10-CM | POA: Diagnosis not present

## 2022-03-16 DIAGNOSIS — I4821 Permanent atrial fibrillation: Secondary | ICD-10-CM | POA: Insufficient documentation

## 2022-03-16 DIAGNOSIS — C3490 Malignant neoplasm of unspecified part of unspecified bronchus or lung: Secondary | ICD-10-CM | POA: Diagnosis not present

## 2022-03-16 DIAGNOSIS — I2510 Atherosclerotic heart disease of native coronary artery without angina pectoris: Secondary | ICD-10-CM | POA: Diagnosis not present

## 2022-03-16 DIAGNOSIS — R846 Abnormal cytological findings in specimens from respiratory organs and thorax: Secondary | ICD-10-CM | POA: Diagnosis not present

## 2022-03-16 DIAGNOSIS — I252 Old myocardial infarction: Secondary | ICD-10-CM

## 2022-03-16 DIAGNOSIS — Z87891 Personal history of nicotine dependence: Secondary | ICD-10-CM

## 2022-03-16 DIAGNOSIS — R918 Other nonspecific abnormal finding of lung field: Secondary | ICD-10-CM | POA: Diagnosis not present

## 2022-03-16 DIAGNOSIS — E039 Hypothyroidism, unspecified: Secondary | ICD-10-CM | POA: Insufficient documentation

## 2022-03-16 DIAGNOSIS — J90 Pleural effusion, not elsewhere classified: Secondary | ICD-10-CM | POA: Diagnosis not present

## 2022-03-16 DIAGNOSIS — C801 Malignant (primary) neoplasm, unspecified: Secondary | ICD-10-CM | POA: Diagnosis not present

## 2022-03-16 DIAGNOSIS — Z951 Presence of aortocoronary bypass graft: Secondary | ICD-10-CM | POA: Diagnosis not present

## 2022-03-16 DIAGNOSIS — E1151 Type 2 diabetes mellitus with diabetic peripheral angiopathy without gangrene: Secondary | ICD-10-CM | POA: Diagnosis not present

## 2022-03-16 DIAGNOSIS — C771 Secondary and unspecified malignant neoplasm of intrathoracic lymph nodes: Secondary | ICD-10-CM | POA: Diagnosis not present

## 2022-03-16 HISTORY — DX: Dyspnea, unspecified: R06.00

## 2022-03-16 HISTORY — PX: THORACENTESIS: SHX235

## 2022-03-16 HISTORY — PX: FINE NEEDLE ASPIRATION: SHX6590

## 2022-03-16 HISTORY — PX: VIDEO BRONCHOSCOPY WITH ENDOBRONCHIAL ULTRASOUND: SHX6177

## 2022-03-16 LAB — BODY FLUID CELL COUNT WITH DIFFERENTIAL
Eos, Fluid: 0 %
Lymphs, Fluid: 97 %
Monocyte-Macrophage-Serous Fluid: 3 % — ABNORMAL LOW (ref 50–90)
Neutrophil Count, Fluid: 0 % (ref 0–25)
Total Nucleated Cell Count, Fluid: 1119 cu mm — ABNORMAL HIGH (ref 0–1000)

## 2022-03-16 LAB — PROTEIN, PLEURAL OR PERITONEAL FLUID: Total protein, fluid: 3.1 g/dL

## 2022-03-16 LAB — GLUCOSE, CAPILLARY
Glucose-Capillary: 106 mg/dL — ABNORMAL HIGH (ref 70–99)
Glucose-Capillary: 88 mg/dL (ref 70–99)

## 2022-03-16 LAB — LACTATE DEHYDROGENASE, PLEURAL OR PERITONEAL FLUID: LD, Fluid: 89 U/L — ABNORMAL HIGH (ref 3–23)

## 2022-03-16 LAB — GLUCOSE, PLEURAL OR PERITONEAL FLUID: Glucose, Fluid: 114 mg/dL

## 2022-03-16 SURGERY — BRONCHOSCOPY, WITH EBUS
Anesthesia: General

## 2022-03-16 SURGERY — THORACENTESIS
Anesthesia: LOCAL

## 2022-03-16 MED ORDER — INSULIN ASPART 100 UNIT/ML IJ SOLN
0.0000 [IU] | INTRAMUSCULAR | Status: DC | PRN
  Filled 2022-03-16: qty 0.07

## 2022-03-16 MED ORDER — OXYCODONE HCL 5 MG PO TABS: 5.0000 mg | ORAL_TABLET | Freq: Once | ORAL | Status: DC | PRN

## 2022-03-16 MED ORDER — OXYCODONE HCL 5 MG/5ML PO SOLN: 5.0000 mg | Freq: Once | ORAL | Status: DC | PRN

## 2022-03-16 MED ORDER — ONDANSETRON HCL 4 MG/2ML IJ SOLN: 4.0000 mg | Freq: Four times a day (QID) | INTRAMUSCULAR | Status: DC | PRN

## 2022-03-16 MED ORDER — MIDAZOLAM HCL 5 MG/5ML IJ SOLN
INTRAMUSCULAR | Status: DC | PRN
  Administered 2022-03-16: 1 mg via INTRAVENOUS

## 2022-03-16 MED ORDER — FENTANYL CITRATE (PF) 100 MCG/2ML IJ SOLN: 25.0000 ug | INTRAMUSCULAR | Status: DC | PRN

## 2022-03-16 MED ORDER — LIDOCAINE HCL URETHRAL/MUCOSAL 2 % EX GEL: 1.0000 | Freq: Once | CUTANEOUS | Status: DC

## 2022-03-16 MED ORDER — LIDOCAINE 2% (20 MG/ML) 5 ML SYRINGE
INTRAMUSCULAR | Status: DC | PRN
  Administered 2022-03-16: 60 mg via INTRAVENOUS

## 2022-03-16 MED ORDER — BUTAMBEN-TETRACAINE-BENZOCAINE 2-2-14 % EX AERO: 1.0000 | INHALATION_SPRAY | Freq: Once | CUTANEOUS | Status: DC

## 2022-03-16 MED ORDER — PROPOFOL 10 MG/ML IV BOLUS
INTRAVENOUS | Status: DC | PRN
  Administered 2022-03-16: 120 mg via INTRAVENOUS
  Administered 2022-03-16: 20 mg via INTRAVENOUS

## 2022-03-16 MED ORDER — SUCCINYLCHOLINE CHLORIDE 200 MG/10ML IV SOSY
PREFILLED_SYRINGE | INTRAVENOUS | Status: DC | PRN
  Administered 2022-03-16: 120 mg via INTRAVENOUS

## 2022-03-16 MED ORDER — CHLORHEXIDINE GLUCONATE 0.12 % MT SOLN
OROMUCOSAL | Status: AC
  Administered 2022-03-16: 15 mL via OROMUCOSAL
  Filled 2022-03-16: qty 15

## 2022-03-16 MED ORDER — PROPOFOL 500 MG/50ML IV EMUL
INTRAVENOUS | Status: DC | PRN
  Administered 2022-03-16: 125 ug/kg/min via INTRAVENOUS

## 2022-03-16 MED ORDER — PHENYLEPHRINE HCL-NACL 20-0.9 MG/250ML-% IV SOLN
INTRAVENOUS | Status: DC | PRN
  Administered 2022-03-16: 25 ug/min via INTRAVENOUS

## 2022-03-16 MED ORDER — FENTANYL CITRATE (PF) 100 MCG/2ML IJ SOLN
INTRAMUSCULAR | Status: DC | PRN
  Administered 2022-03-16: 50 ug via INTRAVENOUS

## 2022-03-16 MED ORDER — PHENYLEPHRINE HCL 0.25 % NA SOLN: 1.0000 | Freq: Four times a day (QID) | NASAL | Status: DC | PRN

## 2022-03-16 MED ORDER — CHLORHEXIDINE GLUCONATE 0.12 % MT SOLN
15.0000 mL | Freq: Once | OROMUCOSAL | Status: AC
  Filled 2022-03-16: qty 15

## 2022-03-16 MED ORDER — LACTATED RINGERS IV SOLN: INTRAVENOUS | Status: DC

## 2022-03-16 SURGICAL SUPPLY — 35 items
ADAPTER VALVE BIOPSY EBUS (MISCELLANEOUS) IMPLANT
ADPTR VALVE BIOPSY EBUS (MISCELLANEOUS)
BRUSH CYTOL CELLEBRITY 1.5X140 (MISCELLANEOUS) IMPLANT
CANISTER SUCT 3000ML PPV (MISCELLANEOUS) ×2 IMPLANT
CONT SPEC 4OZ CLIKSEAL STRL BL (MISCELLANEOUS) ×2 IMPLANT
COVER BACK TABLE 60X90IN (DRAPES) ×2 IMPLANT
FORCEPS BIOP RJ4 1.8 (CUTTING FORCEPS) IMPLANT
GAUZE SPONGE 4X4 12PLY STRL (GAUZE/BANDAGES/DRESSINGS) ×2 IMPLANT
GLOVE BIO SURGEON STRL SZ7.5 (GLOVE) ×2 IMPLANT
GOWN STRL REUS W/ TWL LRG LVL3 (GOWN DISPOSABLE) ×2 IMPLANT
GOWN STRL REUS W/ TWL XL LVL3 (GOWN DISPOSABLE) ×2 IMPLANT
GOWN STRL REUS W/TWL LRG LVL3 (GOWN DISPOSABLE) ×2
GOWN STRL REUS W/TWL XL LVL3 (GOWN DISPOSABLE) ×2
KIT CLEAN ENDO COMPLIANCE (KITS) ×4 IMPLANT
KIT TURNOVER KIT B (KITS) ×2 IMPLANT
MARKER SKIN DUAL TIP RULER LAB (MISCELLANEOUS) ×2 IMPLANT
NDL ASPIRATION VIZISHOT 19G (NEEDLE) IMPLANT
NDL ASPIRATION VIZISHOT 21G (NEEDLE) IMPLANT
NEEDLE ASPIRATION VIZISHOT 19G (NEEDLE) IMPLANT
NEEDLE ASPIRATION VIZISHOT 21G (NEEDLE) IMPLANT
NS IRRIG 1000ML POUR BTL (IV SOLUTION) ×2 IMPLANT
OIL SILICONE PENTAX (PARTS (SERVICE/REPAIRS)) ×2 IMPLANT
PAD ARMBOARD 7.5X6 YLW CONV (MISCELLANEOUS) ×4 IMPLANT
SYR 20ML ECCENTRIC (SYRINGE) ×4 IMPLANT
SYR 20ML LL LF (SYRINGE) ×4 IMPLANT
SYR 50ML SLIP (SYRINGE) IMPLANT
SYR 5ML LUER SLIP (SYRINGE) ×2 IMPLANT
TOWEL GREEN STERILE FF (TOWEL DISPOSABLE) ×2 IMPLANT
TRAP SPECIMEN MUCOUS 40CC (MISCELLANEOUS) IMPLANT
TUBE CONNECTING 20X1/4 (TUBING) ×4 IMPLANT
UNDERPAD 30X30 (UNDERPADS AND DIAPERS) ×2 IMPLANT
VALVE BIOPSY  SINGLE USE (MISCELLANEOUS) ×2
VALVE BIOPSY SINGLE USE (MISCELLANEOUS) ×2 IMPLANT
VALVE SUCTION BRONCHIO DISP (MISCELLANEOUS) ×2 IMPLANT
WATER STERILE IRR 1000ML POUR (IV SOLUTION) ×2 IMPLANT

## 2022-03-16 NOTE — Anesthesia Procedure Notes (Signed)
Procedure Name: Intubation Date/Time: 03/16/2022 1:04 PM  Performed by: Wilburn Cornelia, CRNAPre-anesthesia Checklist: Patient identified, Emergency Drugs available, Suction available, Patient being monitored and Timeout performed Patient Re-evaluated:Patient Re-evaluated prior to induction Oxygen Delivery Method: Circle system utilized Preoxygenation: Pre-oxygenation with 100% oxygen Induction Type: IV induction Ventilation: Mask ventilation without difficulty Laryngoscope Size: Mac and 4 Grade View: Grade I Tube type: Oral Tube size: 8.5 mm Number of attempts: 1 Airway Equipment and Method: Stylet Placement Confirmation: ETT inserted through vocal cords under direct vision, positive ETCO2, CO2 detector and breath sounds checked- equal and bilateral Secured at: 23 cm Tube secured with: Tape Dental Injury: Teeth and Oropharynx as per pre-operative assessment

## 2022-03-16 NOTE — Op Note (Signed)
Flexible and EBUS Bronchoscopy Procedure Note  Fields Oros  [id number]  June 12, 1940  Date:03/16/22  Time:1:57 PM   Provider Performing:Alok Minshall C Tamala Julian   Procedure: Flexible bronchoscopy and EBUS Bronchoscopy  Indication(s) adenopathy  Consent Risks of the procedure as well as the alternatives and risks of each were explained to the patient and/or caregiver.  Consent for the procedure was obtained.  Anesthesia General Anesthesia   Time Out Verified patient identification, verified procedure, site/side was marked, verified correct patient position, special equipment/implants available, medications/allergies/relevant history reviewed, required imaging and test results available.   Sterile Technique Usual hand hygiene, masks, gowns, and gloves were used   Procedure Description The EBUS bronchoscope was advanced into airway with stations 7 biopsied and sent for slide, cell block, and/or culture.  The EBUS bronchoscope was removed after assuring no active bleeding from biopsy site.  Findings:  Marked adenopathy on [place]   Complications/Tolerance None; patient tolerated the procedure well. Chest X-ray is needed post procedure.   EBL Minimal   Specimen(s) Station 7 slide and cell block

## 2022-03-16 NOTE — Anesthesia Preprocedure Evaluation (Signed)
Anesthesia Evaluation  Patient identified by MRN, date of birth, ID band Patient awake    Reviewed: Allergy & Precautions, H&P , NPO status , Patient's Chart, lab work & pertinent test results  Airway Mallampati: II   Neck ROM: full    Dental   Pulmonary shortness of breath, sleep apnea , former smoker,  Lung mass   breath sounds clear to auscultation       Cardiovascular hypertension, + CAD, + Past MI and + Peripheral Vascular Disease   Rhythm:regular Rate:Normal     Neuro/Psych  Neuromuscular disease    GI/Hepatic   Endo/Other  diabetes, Type 2Hypothyroidism   Renal/GU      Musculoskeletal   Abdominal   Peds  Hematology   Anesthesia Other Findings   Reproductive/Obstetrics                             Anesthesia Physical Anesthesia Plan  ASA: 3  Anesthesia Plan: General   Post-op Pain Management:    Induction: Intravenous  PONV Risk Score and Plan: 2 and Ondansetron, Dexamethasone and Treatment may vary due to age or medical condition  Airway Management Planned: Oral ETT  Additional Equipment:   Intra-op Plan:   Post-operative Plan: Extubation in OR  Informed Consent: I have reviewed the patients History and Physical, chart, labs and discussed the procedure including the risks, benefits and alternatives for the proposed anesthesia with the patient or authorized representative who has indicated his/her understanding and acceptance.     Dental advisory given  Plan Discussed with: CRNA, Anesthesiologist and Surgeon  Anesthesia Plan Comments:         Anesthesia Quick Evaluation

## 2022-03-16 NOTE — Op Note (Signed)
Thoracentesis  Procedure Note  Harry Brown  [id number]  [birth year]  Date:03/16/22  Time:1:03 PM   Provider Performing:Joslyn Ramos C Tamala Julian   Procedure: Thoracentesis with imaging guidance (87579)  Indication(s) Pleural Effusion  Consent Risks of the procedure as well as the alternatives and risks of each were explained to the patient and/or caregiver.  Consent for the procedure was obtained and is signed in the bedside chart  Anesthesia Topical only with 1% lidocaine    Time Out Verified patient identification, verified procedure, site/side was marked, verified correct patient position, special equipment/implants available, medications/allergies/relevant history reviewed, required imaging and test results available.   Sterile Technique Maximal sterile technique including full sterile barrier drape, hand hygiene, sterile gown, sterile gloves, mask, hair covering, sterile ultrasound probe cover (if used).  Procedure Description Ultrasound was used to identify appropriate pleural anatomy for placement and overlying skin marked.  Area of drainage cleaned and draped in sterile fashion. Lidocaine was used to anesthetize the skin and subcutaneous tissue.  1450 cc's of amber appearing fluid was drained from the right pleural space. Catheter then removed and bandaid applied to site.   Complications/Tolerance None; patient tolerated the procedure well. Chest X-ray is ordered to confirm no post-procedural complication.   EBL Minimal   Specimen(s) Pleural fluid

## 2022-03-16 NOTE — Transfer of Care (Signed)
Immediate Anesthesia Transfer of Care Note  Patient: Harry Angst Sr.  Procedure(s) Performed: VIDEO BRONCHOSCOPY WITH ENDOBRONCHIAL ULTRASOUND THORACENTESIS  Patient Location: Endoscopy Unit  Anesthesia Type:General  Level of Consciousness: awake and alert   Airway & Oxygen Therapy: Patient Spontanous Breathing and Patient connected to nasal cannula oxygen  Post-op Assessment: Report given to RN and Post -op Vital signs reviewed and stable  Post vital signs: Reviewed and stable  Last Vitals:  Vitals Value Taken Time  BP 157/88 03/16/22 1343  Temp    Pulse 81 03/16/22 1344  Resp 17 03/16/22 1344  SpO2 100 % 03/16/22 1344  Vitals shown include unvalidated device data.  Last Pain:  Vitals:   03/16/22 1103  TempSrc:   PainSc: 0-No pain         Complications: No notable events documented.

## 2022-03-16 NOTE — Progress Notes (Signed)
Patient had bronch and thoracentesis done today for diagnostic purposes. Will follow for path.   Oncology Nurse Navigator Documentation     03/16/2022    1:15 PM  Oncology Nurse Navigator Flowsheets  Navigator Follow Up Date: 03/19/2022  Navigator Follow Up Reason: Pathology  Navigator Location [facility]  Navigator Encounter Type Appt/Treatment Plan Review  Patient Visit Type MedOnc  Treatment Phase Abnormal Scans  Barriers/Navigation Needs Coordination of Care;Education  Interventions None Required  Acuity Level 2-Minimal Needs (1-2 Barriers Identified)  Support Groups/Services Friends and Family  Time Spent with Patient 15

## 2022-03-16 NOTE — Discharge Instructions (Signed)
I will call you with results.

## 2022-03-16 NOTE — Interval H&P Note (Signed)
Seen in short stay. No changes to history or exam. Consents to thora and bronch after risks and benefits discussed.   Erskine Emery MD PCCM

## 2022-03-17 LAB — TRIGLYCERIDES, BODY FLUIDS: Triglycerides, Fluid: 9 mg/dL

## 2022-03-18 ENCOUNTER — Other Ambulatory Visit: Payer: Self-pay | Admitting: Radiation Therapy

## 2022-03-18 ENCOUNTER — Telehealth: Payer: Self-pay | Admitting: Pulmonary Disease

## 2022-03-18 ENCOUNTER — Encounter (HOSPITAL_COMMUNITY): Payer: Self-pay | Admitting: Internal Medicine

## 2022-03-18 DIAGNOSIS — C7931 Secondary malignant neoplasm of brain: Secondary | ICD-10-CM

## 2022-03-18 LAB — CYTOLOGY - NON PAP

## 2022-03-18 NOTE — Telephone Encounter (Signed)
Received results of thoracentesis and biopsy via EBUS.  No pleural cells identified in the pleural effusion although it is highly lymphocytic predominant which is suggestive of malignant effusion.  Results of FNA of station 7 node are consistent with small cell carcinoma.  I called Harry Brown to discuss these results.  I let him know that the biopsy was consistent with lung cancer.  He had recent MRI brain with several small metastasis.  He stated Dr. Marin Olp has already reached out to him to arrange radiation therapy.  I will forward this message and results to Dr. Marin Olp for ongoing care.  I will also forward this message to Dr. Tamala Julian who performed the biopsy.  I stressed the importance of ongoing oncologic and radiation oncology follow-up.  I stated that myself and pulmonary medicine will take a bit of a step to the background for the next few weeks.  He stated he had not really improved in terms of shortness of breath after thoracentesis.  This makes me think repeat thoracentesis in the future is likely not to be beneficial in terms of therapeutic improvement in symptoms.  We could always reevaluate in the future.  We have upcoming follow-up 04/07/2022 which we will keep for now.  If he needs to reschedule for any reason that is no problem. He can contact me via MyChart or call the office anytime with any questions or concerns.

## 2022-03-18 NOTE — Anesthesia Postprocedure Evaluation (Signed)
Anesthesia Post Note  Patient: Harry Angst Sr.  Procedure(s) Performed: VIDEO BRONCHOSCOPY WITH ENDOBRONCHIAL ULTRASOUND THORACENTESIS     Patient location during evaluation: PACU Anesthesia Type: General Level of consciousness: awake and alert Pain management: pain level controlled Vital Signs Assessment: post-procedure vital signs reviewed and stable Respiratory status: spontaneous breathing, nonlabored ventilation, respiratory function stable and patient connected to nasal cannula oxygen Cardiovascular status: blood pressure returned to baseline and stable Postop Assessment: no apparent nausea or vomiting Anesthetic complications: no   No notable events documented.  Last Vitals:  Vitals:   03/16/22 1410 03/16/22 1420  BP: (!) 142/85 (!) 148/81  Pulse: 67 66  Resp: 13 18  Temp:    SpO2: 97% 98%    Last Pain:  Vitals:   03/16/22 1401  TempSrc:   PainSc: 0-No pain                 [name] S

## 2022-03-18 NOTE — Addendum Note (Signed)
Addended by: Pincus Large on: 03/18/2022 12:29 PM   Modules accepted: Orders

## 2022-03-18 NOTE — Progress Notes (Signed)
Called patient and informed him of results.

## 2022-03-19 ENCOUNTER — Encounter: Payer: Self-pay | Admitting: *Deleted

## 2022-03-19 DIAGNOSIS — C3490 Malignant neoplasm of unspecified part of unspecified bronchus or lung: Secondary | ICD-10-CM

## 2022-03-19 NOTE — Progress Notes (Signed)
Per Dr Marin Olp, request for [facility] testing sent on specimen [id number] DOS 03/16/22.  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 He is already consulting with radiation oncology. His PET is scheduled for 03/24/22. Orders placed for port in anticipation of treatment.   Called patient and spoke to his wife, and notified him of port order. He is aware that IR will be calling him to schedule a port placement.   Oncology Nurse Navigator Documentation     03/19/2022    8:30 AM  Oncology Nurse Navigator Flowsheets  Confirmed Diagnosis Date 03/16/2022  Diagnosis Status Pending Molecular Studies  Navigator Follow Up Date: 03/23/2022  Navigator Follow Up Reason: Review Note  Navigator Location [employer]  Navigator Encounter Type Molecular Studies;Appt/Treatment Plan Review;Telephone  Telephone Education;Outgoing Call  Patient Visit Type MedOnc  Treatment Phase Pre-Tx/Tx Discussion  Barriers/Navigation Needs Coordination of Care;Education  Education Other  Interventions Coordination of Care;Education  Acuity Level 2-Minimal Needs (1-2 Barriers Identified)  Coordination of Care Pathology  Education Method Verbal  Support Groups/Services Friends and Family  Time Spent with Patient 62

## 2022-03-20 DIAGNOSIS — C3490 Malignant neoplasm of unspecified part of unspecified bronchus or lung: Secondary | ICD-10-CM | POA: Diagnosis not present

## 2022-03-20 DIAGNOSIS — R251 Tremor, unspecified: Secondary | ICD-10-CM | POA: Diagnosis not present

## 2022-03-20 DIAGNOSIS — Z87891 Personal history of nicotine dependence: Secondary | ICD-10-CM | POA: Diagnosis not present

## 2022-03-20 DIAGNOSIS — C7931 Secondary malignant neoplasm of brain: Secondary | ICD-10-CM | POA: Diagnosis not present

## 2022-03-22 ENCOUNTER — Telehealth: Payer: Self-pay | Admitting: Oncology

## 2022-03-22 NOTE — Telephone Encounter (Signed)
Called Harry Brown and rescheduled his consult with Dr. Sondra Come to tomorrow.  Advised him to arrive at 12:30.  He verbalized understanding and agreement.

## 2022-03-22 NOTE — Progress Notes (Signed)
Radiation Oncology         [phone] ________________________________  Initial Outpatient Consultation  Name: Harry CANNEDY Sr. MRN: [id number]  Date: 03/23/2022  DOB: 12/12/1940  PP:IRJJO, Bonnita Levan, MD  Volanda Napoleon, MD   REFERRING PHYSICIAN: Volanda Napoleon, MD  DIAGNOSIS: There were no encounter diagnoses.  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 is a 81 y.o. male who is accompanied by ***. he is seen as a courtesy of Dr. Marin Olp for an opinion concerning radiation therapy as part of management for his recently diagnosed metastatic right lung cancer with brain and nodal metastases.   The patient presented to his PCP this past August with several months of SOB. Chest x-ray performed on 02/04/22 showed a right sided pleural effusion. Repeat chest x-ray again showed a right sided pleural effusion.   Chest CT on 03/04/22 for further evaluation of SOB and x-ray findings revealed 3 right lower lobe pulmonary nodules, the largest of which being subpleural and measuring 2 cm. CT also showed interstitial thickening peripherally at the right lung base, and bulky right hilar and mediastinal adenopathy concerning for metastatic carcinoma.  Accordingly, the patient was referred to Dr. Silas Flood, [facility], who recommended proceeding with thoracentesis and EBUS with biopsies for further evaluation. The patient also met with Dr. Marin Olp the following day (03/11/22) who recommended MRI and a PET scan (in addition to tissue sampling) prior to making a decision regarding treatment.   MRI of the brain on 03/15/22 unfortunately showed evidence of intracranial metastases, demonstrated by approximately 5-6 minimally enhancing lesions. The largest lesions are located in the left parietal lobe and right external capsule. Mild edema was also  appreciated without midline shift.   The patient opted to proceed with bronchoscopy and biopsies on 03/16/22 under Dr. Tamala Julian. FNA of lymph node 7 revealed findings consistent with small cell carcinoma. He also underwent right pleural thoracentesis which yielded 1450 cc's of amber appearing fluid. Pleural fluid was sent for cytology which showed no malignant cells but findings consistent with chronic inflammation.   The patient's PET scan is scheduled for 03/24/22.   PREVIOUS RADIATION THERAPY: No  PAST MEDICAL HISTORY:  Past Medical History:  Diagnosis Date   Anemia 09/24/2013   CAD (coronary artery disease)    Cellulitis 05/14/2013   RLE   Diverticulitis    Dyspnea    03/15/22- due to fluid in lung.   ED (erectile dysfunction)    Glaucoma 08/18/2014   History of sleep apnea    had surgery to correct   Hypertension    Hypothyroidism    Medicare annual wellness visit, subsequent 04/13/2013   Myocardial infarction ([place]) 11/05/1985   Nocturia 03/15/2017   Other and unspecified hyperlipidemia    Penile lesion 02/24/2015   Peripheral neuropathy 03/31/2012   Peripheral vascular disease ([place])    Postoperative anemia due to acute blood loss 09/24/2013   Preventative health care 03/13/2016   Sleep apnea 09/16/2017   Type II diabetes mellitus ([place])    fasting 100-120   Urinary urgency 11/22/2012    PAST SURGICAL HISTORY: Past Surgical History:  Procedure Laterality Date   ABDOMINAL AORTAGRAM  Oct. 9, 2014   Dr. Bridgett Larsson   ABDOMINAL Maxcine Ham N/A 03/15/2013   Procedure: ABDOMINAL Maxcine Ham;  Surgeon: Conrad Wauregan, MD;  Location: [facility] CATH LAB;  Service: Cardiovascular;  Laterality: N/A;  ANTERIOR CERVICAL DECOMP/DISCECTOMY FUSION  ~ 2000   CARDIOVERSION N/A 12/03/2019   Procedure: CARDIOVERSION;  Surgeon: Acie Fredrickson Wonda Cheng, MD;  Location: [facility] ENDOSCOPY;  Service: Cardiovascular;  Laterality: N/A;   CATARACT EXTRACTION Right    CORONARY ARTERY BYPASS GRAFT  2001   "CABG X3" (05/14/2013)    DENTAL SURGERY     "multiple teeth removed; trmimed top of mouth so dentures would fit" (05/14/2013)   EYE SURGERY     cataracts   FEMORAL-POPLITEAL BYPASS GRAFT Right 04/25/2013   Procedure: RIGHT FEMORAL TO ABOVE KNEE POPLITEAL BYPASS GRAFT WITH RIGHT [place]; ULTRASOUND GUIDED;  Surgeon: Conrad [place], MD;  Location: [place];  Service: Vascular;  Laterality: Right;   FINE NEEDLE ASPIRATION  03/16/2022   Procedure: FINE NEEDLE ASPIRATION;  Surgeon: Candee Furbish, MD;  Location: [facility] ENDOSCOPY;  Service: Pulmonary;;   GUM SURGERY  [phone]   "had bone scraped; had periodontal disease" (05/14/2013)   PALATE / UVULA BIOPSY / EXCISION  2003   Removed due to sleep apnea   TEE WITHOUT CARDIOVERSION N/A 06/13/2020   Procedure: TRANSESOPHAGEAL ECHOCARDIOGRAM (TEE);  Surgeon: Geralynn Rile, MD;  Location: [place];  Service: Cardiovascular;  Laterality: N/A;   THORACENTESIS N/A 03/16/2022   Procedure: Mathews Robinsons;  Surgeon: Candee Furbish, MD;  Location: [facility] ENDOSCOPY;  Service: Pulmonary;  Laterality: N/A;   TONSILLECTOMY     VIDEO BRONCHOSCOPY WITH ENDOBRONCHIAL ULTRASOUND N/A 03/16/2022   Procedure: VIDEO BRONCHOSCOPY WITH ENDOBRONCHIAL ULTRASOUND;  Surgeon: Candee Furbish, MD;  Location: [facility] ENDOSCOPY;  Service: Pulmonary;  Laterality: N/A;    FAMILY HISTORY:  Family History  Problem Relation Age of Onset   Hyperlipidemia Mother    Heart disease Mother    Diabetes Mother    Hypertension Mother    Heart disease Father    Asthma Father    Arthritis Father    Heart disease Sister    Heart disease Sister        s/p bypass x 2   Lupus Sister    Heart disease Brother        MI waiting on heart transplant when died   Heart disease Brother        massive MI   Cancer Neg Hx     SOCIAL HISTORY:  Social History   Tobacco Use   Smoking status: Former    Packs/day: 1.50    Years: 50.00    Total pack years: 75.00    Types: Cigarettes    Quit date: 2006     Years since quitting: 17.8   Smokeless tobacco: Never   Tobacco comments:    05/14/2013 "quit smoking in ~ 2004; still Uses Comit lozenges"  Vaping Use   Vaping Use: Never used  Substance Use Topics   Alcohol use: No    Alcohol/week: 0.0 standard drinks of alcohol   Drug use: No    ALLERGIES:  Allergies  Allergen Reactions   Adhesive [Tape] Rash   Latex Rash   Other Hives, Swelling and Rash    Plastic shopping bags Metal staples: rash, swelling    MEDICATIONS:  Current Outpatient Medications  Medication Sig Dispense Refill   atenolol (TENORMIN) 25 MG tablet Take 1 tablet (25 mg total) by mouth daily. 90 tablet 0   atorvastatin (LIPITOR) 40 MG tablet Take 1 tablet (40 mg total) by mouth daily. 90 tablet 0   Cholecalciferol (VITAMIN D) 50 MCG (2000 UT) tablet Take 2,000 Units by mouth daily.  diazepam (VALIUM) 5 MG tablet Take 1 tablet (5 mg total) by mouth as needed for sedation (Take 30 minutes to 1 hour prior to MRI). 1 tablet 0   ELIQUIS 5 MG TABS tablet TAKE ONE (1) TABLET BY MOUTH TWO (2) TIMES DAILY 60 tablet 5   latanoprost (XALATAN) 0.005 % ophthalmic solution Place 1 drop into both eyes at bedtime.      levothyroxine (SYNTHROID) 75 MCG tablet TAKE ONE (1) TABLET BY MOUTH EVERY DAY 90 tablet 0   loratadine (CLARITIN) 10 MG tablet Take 10 mg by mouth daily as needed for allergies.     losartan (COZAAR) 25 MG tablet Take 1 tablet (25 mg total) by mouth daily. 90 tablet 1   metFORMIN (GLUCOPHAGE-XR) 500 MG 24 hr tablet TAKE TWO (2) TABLETS BY MOUTH EVERY MORNING WITH BREAKFAST 180 tablet 1   nitroGLYCERIN (NITROSTAT) 0.4 MG SL tablet Place 1 tablet (0.4 mg total) under the tongue every 5 (five) minutes as needed for chest pain. 25 tablet 11   ONETOUCH DELICA LANCETS 78H MISC USE TO CHECK BLOOD SUGAR ONCE DAILY 100 each 1   ONETOUCH ULTRA test strip USE 1 STRIP DAILY 100 each 12   tamsulosin (FLOMAX) 0.4 MG CAPS capsule Take 1 capsule (0.4 mg total) by mouth daily. 90  capsule 1   vitamin B-12 (CYANOCOBALAMIN) 1000 MCG tablet Take 1,000 mcg by mouth daily.     No current facility-administered medications for this encounter.    REVIEW OF SYSTEMS:  A 10+ POINT REVIEW OF SYSTEMS WAS OBTAINED including neurology, dermatology, psychiatry, cardiac, respiratory, lymph, extremities, GI, GU, musculoskeletal, constitutional, reproductive, HEENT. ***   PHYSICAL EXAM:  vitals were not taken for this visit.   General: Alert and oriented, in no acute distress HEENT: Head is normocephalic. Extraocular movements are intact. Oropharynx is clear. Neck: Neck is supple, no palpable cervical or supraclavicular lymphadenopathy. Heart: Regular in rate and rhythm with no murmurs, rubs, or gallops. Chest: Clear to auscultation bilaterally, with no rhonchi, wheezes, or rales. Abdomen: Soft, nontender, nondistended, with no rigidity or guarding. Extremities: No cyanosis or edema. Lymphatics: see Neck Exam Skin: No concerning lesions. Musculoskeletal: symmetric strength and muscle tone throughout. Neurologic: Cranial nerves II through XII are grossly intact. No obvious focalities. Speech is fluent. Coordination is intact. Psychiatric: Judgment and insight are intact. Affect is appropriate. ***  ECOG = ***  0 - Asymptomatic (Fully active, able to carry on all predisease activities without restriction)  1 - Symptomatic but completely ambulatory (Restricted in physically strenuous activity but ambulatory and able to carry out work of a light or sedentary nature. For example, light housework, office work)  2 - Symptomatic, <50% in bed during the day (Ambulatory and capable of all self care but unable to carry out any work activities. Up and about more than 50% of waking hours)  3 - Symptomatic, >50% in bed, but not bedbound (Capable of only limited self-care, confined to bed or chair 50% or more of waking hours)  4 - Bedbound (Completely disabled. Cannot carry on any self-care.  Totally confined to bed or chair)  5 - Death   Eustace Pen MM, Creech RH, Tormey DC, et al. [phone]). "Toxicity and response criteria of the [facility]". [place] Oncol. 5 (6): 649-55  LABORATORY DATA:  Lab Results  Component Value Date   WBC 8.3 03/11/2022   HGB 11.8 (L) 03/11/2022   HCT 36.0 (L) 03/11/2022   MCV 92.8 03/11/2022   PLT  215 03/11/2022   NEUTROABS 5.5 03/11/2022   Lab Results  Component Value Date   NA 137 03/11/2022   K 4.6 03/11/2022   CL 105 03/11/2022   CO2 26 03/11/2022   GLUCOSE 188 (H) 03/11/2022   BUN 21 03/11/2022   CREATININE 1.05 03/11/2022   CALCIUM 9.6 03/11/2022      RADIOGRAPHY: MR Brain W Wo Contrast  Result Date: 03/17/2022 CLINICAL DATA:  Non-small cell lung carcinoma staging EXAM: MRI HEAD WITHOUT AND WITH CONTRAST TECHNIQUE: Multiplanar, multiecho pulse sequences of the brain and surrounding structures were obtained without and with intravenous contrast. CONTRAST:  8.36m GADAVIST GADOBUTROL 1 MMOL/ML IV SOLN COMPARISON:  09/12/2021 FINDINGS: Brain: There are 5-6 minimally enhancing intracranial lesions (best characterized on series 17). The largest lesion is in the posterior paramedian left parietal lobe and measures 18 mm. The next largest lesion is at the right external capsule in measures 12 mm. There is moderate edema surrounding both of these lesions. No midline shift or other mass effect. There is hemorrhage within both of the larger lesions, as well as a few scattered chronic microhemorrhages in a nonspecific pattern. Vascular: Normal flow voids. Skull and upper cervical spine: Normal marrow signal. Sinuses/Orbits: Negative. Other: None. IMPRESSION: Approximately 5-6 minimally enhancing intracranial metastases. Largest lesions are located in the left parietal lobe and right external capsule. Mild edema without midline shift. Electronically Signed   By: KUlyses JarredM.D.   On: 03/17/2022 03:13   DG Chest Port 1  View  Result Date: 03/16/2022 CLINICAL DATA:  History of thoracentesis EXAM: PORTABLE CHEST 1 VIEW COMPARISON:  Previous studies including the chest radiograph done on 02/18/2022 FINDINGS: Transverse diameter of heart is slightly increased. There is evidence of previous coronary bypass surgery. There are no signs of pulmonary edema or focal pulmonary consolidation. Head soft tissue interfaces are seen in both lower lung fields. Lung markings could be seen extending beyond these interfaces, possibly suggesting skin folds. There is no pleural effusion or pneumothorax. There is surgical fusion in lower cervical spine. Food 920 IMPRESSION: There are no signs of pulmonary edema or focal pulmonary consolidation. Electronically Signed   By: PElmer PickerM.D.   On: 03/16/2022 14:14   CT Chest W Contrast  Result Date: 03/06/2022 CLINICAL DATA:  Shortness of breath, pleural effusion EXAM: CT CHEST WITH CONTRAST TECHNIQUE: Multidetector CT imaging of the chest was performed during intravenous contrast administration. RADIATION DOSE REDUCTION: This exam was performed according to the departmental dose-optimization program which includes automated exposure control, adjustment of the mA and/or kV according to patient size and/or use of iterative reconstruction technique. CONTRAST:  755mOMNIPAQUE IOHEXOL 300 MG/ML  SOLN COMPARISON:  11/11/2020 FINDINGS: Cardiovascular: Left innominate vein stenosis secondary to anterior mediastinal adenopathy. Heart size normal. No pericardial effusion. Extensive coronary calcifications, post CABG. Calcified plaque in the arch and descending thoracic aorta. Mediastinum/Nodes: Bulky right hilar, contiguous 2.6 cm right paratracheal, confluent 4.6 cm subcarinal, 2.6 cm AP window and 2.3 cm anterior mediastinal adenopathy, new since previous. There is a 1.1 cm borderline enlarged right supraclavicular node (Im17,Se2) . Lungs/Pleura: 3 right lower lobe nodules the largest subpleural  measuring 2 cm maximum diameter (Im30,Se6) . Interstitial thickening peripherally at the right lung base. Left lung clear. Upper Abdomen: No adrenal nodule. Atheromatous aorta. No acute findings. Musculoskeletal: Sternotomy wires. Cervical fixation hardware partially visualized. Spondylitic changes in the lower thoracic spine. No acute findings. IMPRESSION: 1. Right lower lobe pulmonary nodules, bulky right hilar and mediastinal adenopathy suggesting  metastatic carcinoma. 2. Moderate layering right pleural effusion 3. Coronary and Aortic Atherosclerosis (ICD10-170.0). Electronically Signed   By: Lucrezia Europe M.D.   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  We discussed the natural history of *** and general treatment, highlighting the role of radiotherapy in the management.  We discussed the available radiation techniques, and focused on the details of logistics and delivery.  We reviewed the anticipated acute and late sequelae associated with radiation in this setting.  The patient was encouraged to ask questions that I answered to the best of my ability. *** A patient consent form was discussed and signed.  We retained a copy for our records.  The patient would like to proceed with radiation and will be scheduled for CT simulation.  PLAN: ***    *** minutes of total time was spent for this patient encounter, including preparation, face-to-face counseling with the patient and coordination of care, physical exam, and documentation of the encounter.   ------------------------------------------------  Blair Promise, PhD, MD  This document serves as a record of services personally performed by Gery Pray, MD. It was created on his behalf by Roney Mans, a trained medical  scribe. The creation of this record is based on the scribe's personal observations and the provider's statements to them. This document has been checked and approved by the attending provider.

## 2022-03-22 NOTE — Progress Notes (Signed)
Canceled encounter 

## 2022-03-23 ENCOUNTER — Ambulatory Visit
Admission: RE | Admit: 2022-03-23 | Discharge: 2022-03-23 | Disposition: A | Discharge status: Home / Self Care | Payer: Medicare Other | Source: Ambulatory Visit | Attending: Radiation Oncology | Admitting: Radiation Oncology

## 2022-03-23 ENCOUNTER — Encounter: Payer: Self-pay | Admitting: Cardiology

## 2022-03-23 ENCOUNTER — Encounter: Payer: Self-pay | Admitting: Radiation Oncology

## 2022-03-23 ENCOUNTER — Encounter: Payer: Self-pay | Admitting: *Deleted

## 2022-03-23 ENCOUNTER — Other Ambulatory Visit: Payer: Self-pay

## 2022-03-23 ENCOUNTER — Ambulatory Visit: Payer: Medicare Other | Admitting: Cardiology

## 2022-03-23 VITALS — BP 133/84 | HR 85 | Temp 98.0°F | Resp 18 | Ht 72.0 in | Wt 183.4 lb

## 2022-03-23 VITALS — BP 120/80 | HR 95 | Ht 72.0 in | Wt 183.8 lb

## 2022-03-23 DIAGNOSIS — C7931 Secondary malignant neoplasm of brain: Secondary | ICD-10-CM

## 2022-03-23 DIAGNOSIS — C3431 Malignant neoplasm of lower lobe, right bronchus or lung: Secondary | ICD-10-CM | POA: Diagnosis not present

## 2022-03-23 DIAGNOSIS — Z87891 Personal history of nicotine dependence: Secondary | ICD-10-CM | POA: Diagnosis not present

## 2022-03-23 DIAGNOSIS — Z510 Encounter for antineoplastic radiation therapy: Secondary | ICD-10-CM | POA: Diagnosis not present

## 2022-03-23 DIAGNOSIS — R609 Edema, unspecified: Secondary | ICD-10-CM | POA: Insufficient documentation

## 2022-03-23 DIAGNOSIS — I714 Abdominal aortic aneurysm, without rupture, unspecified: Secondary | ICD-10-CM | POA: Diagnosis not present

## 2022-03-23 DIAGNOSIS — E785 Hyperlipidemia, unspecified: Secondary | ICD-10-CM | POA: Insufficient documentation

## 2022-03-23 DIAGNOSIS — I2510 Atherosclerotic heart disease of native coronary artery without angina pectoris: Secondary | ICD-10-CM | POA: Diagnosis not present

## 2022-03-23 DIAGNOSIS — J90 Pleural effusion, not elsewhere classified: Secondary | ICD-10-CM | POA: Diagnosis not present

## 2022-03-23 DIAGNOSIS — R0602 Shortness of breath: Secondary | ICD-10-CM | POA: Diagnosis not present

## 2022-03-23 DIAGNOSIS — I252 Old myocardial infarction: Secondary | ICD-10-CM | POA: Insufficient documentation

## 2022-03-23 DIAGNOSIS — E039 Hypothyroidism, unspecified: Secondary | ICD-10-CM | POA: Diagnosis not present

## 2022-03-23 DIAGNOSIS — I712 Thoracic aortic aneurysm, without rupture, unspecified: Secondary | ICD-10-CM

## 2022-03-23 DIAGNOSIS — Z7901 Long term (current) use of anticoagulants: Secondary | ICD-10-CM | POA: Diagnosis not present

## 2022-03-23 DIAGNOSIS — G629 Polyneuropathy, unspecified: Secondary | ICD-10-CM | POA: Diagnosis not present

## 2022-03-23 DIAGNOSIS — G4730 Sleep apnea, unspecified: Secondary | ICD-10-CM | POA: Insufficient documentation

## 2022-03-23 DIAGNOSIS — I4821 Permanent atrial fibrillation: Secondary | ICD-10-CM

## 2022-03-23 DIAGNOSIS — E119 Type 2 diabetes mellitus without complications: Secondary | ICD-10-CM | POA: Insufficient documentation

## 2022-03-23 DIAGNOSIS — I739 Peripheral vascular disease, unspecified: Secondary | ICD-10-CM | POA: Insufficient documentation

## 2022-03-23 DIAGNOSIS — Z7984 Long term (current) use of oral hypoglycemic drugs: Secondary | ICD-10-CM | POA: Diagnosis not present

## 2022-03-23 DIAGNOSIS — I10 Essential (primary) hypertension: Secondary | ICD-10-CM | POA: Insufficient documentation

## 2022-03-23 DIAGNOSIS — I700 Atherosclerosis of aorta: Secondary | ICD-10-CM | POA: Insufficient documentation

## 2022-03-23 DIAGNOSIS — Z79899 Other long term (current) drug therapy: Secondary | ICD-10-CM | POA: Diagnosis not present

## 2022-03-23 MED ORDER — DEXAMETHASONE 4 MG PO TABS: 4.0000 mg | ORAL_TABLET | Freq: Every day | ORAL | 0 refills | Status: DC

## 2022-03-23 NOTE — Progress Notes (Signed)
Patient is scheduled to see RadOnc today. His port has been scheduled for 03/29/2022. Will review radiation plan. Continue to wait on molecular report.   Oncology Nurse Navigator Documentation     03/23/2022   10:30 AM  Oncology Nurse Navigator Flowsheets  Navigator Follow Up Date: 03/24/2022  Navigator Follow Up Reason: Review Note  Navigator Location [facility]  Navigator Encounter Type Appt/Treatment Plan Review  Patient Visit Type MedOnc  Treatment Phase Pre-Tx/Tx Discussion  Barriers/Navigation Needs Coordination of Care;Education  Interventions None Required  Acuity Level 2-Minimal Needs (1-2 Barriers Identified)  Support Groups/Services Friends and Family  Time Spent with Patient 15

## 2022-03-23 NOTE — Patient Instructions (Signed)
  Follow-Up: At [facility], you and your health needs are our priority.  As part of our continuing mission to provide you with exceptional heart care, we have created designated Provider Care Teams.  These Care Teams include your primary Cardiologist (physician) and Advanced Practice Providers (APPs -  Physician Assistants and Nurse Practitioners) who all work together to provide you with the care you need, when you need it.  We recommend signing up for the patient portal called "MyChart".  Sign up information is provided on this After Visit Summary.  MyChart is used to connect with patients for Virtual Visits (Telemedicine).  Patients are able to view lab/test results, encounter notes, upcoming appointments, etc.  Non-urgent messages can be sent to your provider as well.   To learn more about what you can do with MyChart, go to [email or web address].    Your next appointment:   6 month(s)  The format for your next appointment:   In Person  Provider:  Kirk Ruths MD

## 2022-03-23 NOTE — Progress Notes (Signed)
Location/Histology of Brain Tumor: Small cell carcinoma - 5-6 minimally enhancing intracranial metastases. Largest lesions are located in the left parietal lobe and right external capsule.  Patient presented with symptoms of:  shortness of breath and weakness  Past or anticipated interventions, if any, per neurosurgery: no  Past or anticipated interventions, if any, per medical oncology: no  Dose of Decadron, if applicable: no  Recent neurologic symptoms, if any:  Seizures: no Headaches: reports having mild headaches Nausea: no Dizziness/ataxia: no Difficulty with hand coordination: no Focal numbness/weakness: no Visual deficits/changes: no Confusion/Memory deficits: His wife reports he is having some memory issues.  Painful bone metastases at present, if any: no  SAFETY ISSUES: Prior radiation? no Pacemaker/ICD? no Possible current pregnancy? no Is the patient on methotrexate? no  Additional Complaints / other details: Patient is here with his wife, daughter and daughter in law.  BP 133/84 (BP Location: Left Arm, Patient Position: Sitting)   Pulse 85   Temp 98 F (36.7 C) (Oral)   Resp 18   Ht 6' (1.829 m)   Wt 183 lb 6 oz (83.2 kg)   SpO2 99%   BMI 24.87 kg/m

## 2022-03-24 ENCOUNTER — Encounter (HOSPITAL_COMMUNITY)
Admission: RE | Admit: 2022-03-24 | Discharge: 2022-03-24 | Disposition: A | Discharge status: Home / Self Care | Payer: Medicare Other | Source: Ambulatory Visit | Attending: Hematology & Oncology | Admitting: Hematology & Oncology

## 2022-03-24 ENCOUNTER — Encounter: Payer: Self-pay | Admitting: *Deleted

## 2022-03-24 DIAGNOSIS — Z87891 Personal history of nicotine dependence: Secondary | ICD-10-CM | POA: Diagnosis not present

## 2022-03-24 DIAGNOSIS — C7931 Secondary malignant neoplasm of brain: Secondary | ICD-10-CM | POA: Diagnosis not present

## 2022-03-24 DIAGNOSIS — C3431 Malignant neoplasm of lower lobe, right bronchus or lung: Secondary | ICD-10-CM | POA: Diagnosis not present

## 2022-03-24 DIAGNOSIS — Z510 Encounter for antineoplastic radiation therapy: Secondary | ICD-10-CM | POA: Diagnosis not present

## 2022-03-24 DIAGNOSIS — C3490 Malignant neoplasm of unspecified part of unspecified bronchus or lung: Secondary | ICD-10-CM | POA: Diagnosis not present

## 2022-03-24 LAB — GLUCOSE, CAPILLARY: Glucose-Capillary: 108 mg/dL — ABNORMAL HIGH (ref 70–99)

## 2022-03-24 MED ORDER — FLUDEOXYGLUCOSE F - 18 (FDG) INJECTION
9.0000 | Freq: Once | INTRAVENOUS | Status: AC | PRN
  Administered 2022-03-24: 9 via INTRAVENOUS

## 2022-03-24 NOTE — Progress Notes (Signed)
Reviewed patient's PET scan. He was seen by Radiation Oncology with plan to start whole brain radiation on 03/25/2022. He currently has no follow up scheduled with this office. Molecular studies are still pending.   Reviewed with Dr Marin Olp and he'd like to have patient come in this week for chemo discussion. Called and spoke to patient. He is aware of appointment date, time and location.   Oncology Nurse Navigator Documentation     03/24/2022   10:00 AM  Oncology Nurse Navigator Flowsheets  Planned Course of Treatment Radiation  Phase of Treatment Radiation  Radiation Actual Start Date: 03/25/2022  Radiation Expected End Date: 04/13/2022  Navigator Follow Up Date: 03/25/2022  Navigator Follow Up Reason: Follow-up Appointment  Navigator Location [facility]  Navigator Encounter Type Scan Review;Appt/Treatment Plan Review;Telephone  Telephone Appt Confirmation/Clarification;Outgoing Call  Patient Visit Type MedOnc  Treatment Phase Pre-Tx/Tx Discussion  Barriers/Navigation Needs Coordination of Care;Education  Education Other  Interventions Coordination of Care  Acuity Level 2-Minimal Needs (1-2 Barriers Identified)  Coordination of Care Appts  Education Method Verbal;Teach-back  Support Groups/Services Friends and Family  Time Spent with Patient 30

## 2022-03-25 ENCOUNTER — Encounter: Payer: Self-pay | Admitting: Hematology & Oncology

## 2022-03-25 ENCOUNTER — Ambulatory Visit
Admission: RE | Admit: 2022-03-25 | Discharge: 2022-03-25 | Disposition: A | Discharge status: Home / Self Care | Payer: Medicare Other | Source: Ambulatory Visit | Attending: Radiation Oncology | Admitting: Radiation Oncology

## 2022-03-25 ENCOUNTER — Other Ambulatory Visit: Payer: Self-pay

## 2022-03-25 ENCOUNTER — Inpatient Hospital Stay: Payer: Medicare Other | Admitting: Hematology & Oncology

## 2022-03-25 ENCOUNTER — Ambulatory Visit: Payer: Medicare Other

## 2022-03-25 VITALS — BP 133/72 | HR 93 | Temp 98.0°F | Resp 18 | Ht 72.0 in | Wt 178.0 lb

## 2022-03-25 DIAGNOSIS — C778 Secondary and unspecified malignant neoplasm of lymph nodes of multiple regions: Secondary | ICD-10-CM | POA: Diagnosis not present

## 2022-03-25 DIAGNOSIS — Z510 Encounter for antineoplastic radiation therapy: Secondary | ICD-10-CM | POA: Diagnosis not present

## 2022-03-25 DIAGNOSIS — C3490 Malignant neoplasm of unspecified part of unspecified bronchus or lung: Secondary | ICD-10-CM

## 2022-03-25 DIAGNOSIS — C3491 Malignant neoplasm of unspecified part of right bronchus or lung: Secondary | ICD-10-CM | POA: Diagnosis not present

## 2022-03-25 DIAGNOSIS — Z87891 Personal history of nicotine dependence: Secondary | ICD-10-CM | POA: Diagnosis not present

## 2022-03-25 DIAGNOSIS — C3431 Malignant neoplasm of lower lobe, right bronchus or lung: Secondary | ICD-10-CM | POA: Diagnosis not present

## 2022-03-25 DIAGNOSIS — C7931 Secondary malignant neoplasm of brain: Secondary | ICD-10-CM | POA: Diagnosis not present

## 2022-03-25 LAB — RAD ONC ARIA SESSION SUMMARY
Course Elapsed Days: 0
Plan Fractions Treated to Date: 1
Plan Prescribed Dose Per Fraction: 2.5 Gy
Plan Total Fractions Prescribed: 14
Plan Total Prescribed Dose: 35 Gy
Reference Point Dosage Given to Date: 2.5 Gy
Reference Point Session Dosage Given: 2.5 Gy
Session Number: 1

## 2022-03-26 ENCOUNTER — Encounter: Payer: Self-pay | Admitting: *Deleted

## 2022-03-26 ENCOUNTER — Ambulatory Visit: Payer: Medicare Other

## 2022-03-26 ENCOUNTER — Other Ambulatory Visit: Payer: Self-pay

## 2022-03-26 ENCOUNTER — Encounter: Payer: Self-pay | Admitting: Hematology & Oncology

## 2022-03-26 ENCOUNTER — Ambulatory Visit
Admission: RE | Admit: 2022-03-26 | Discharge: 2022-03-26 | Disposition: A | Discharge status: Home / Self Care | Payer: Medicare Other | Source: Ambulatory Visit | Attending: Radiation Oncology | Admitting: Radiation Oncology

## 2022-03-26 DIAGNOSIS — C3491 Malignant neoplasm of unspecified part of right bronchus or lung: Secondary | ICD-10-CM

## 2022-03-26 DIAGNOSIS — C7931 Secondary malignant neoplasm of brain: Secondary | ICD-10-CM | POA: Diagnosis not present

## 2022-03-26 DIAGNOSIS — C3490 Malignant neoplasm of unspecified part of unspecified bronchus or lung: Secondary | ICD-10-CM

## 2022-03-26 DIAGNOSIS — Z510 Encounter for antineoplastic radiation therapy: Secondary | ICD-10-CM | POA: Diagnosis not present

## 2022-03-26 HISTORY — DX: Malignant neoplasm of unspecified part of right bronchus or lung: C34.91

## 2022-03-26 HISTORY — DX: Malignant neoplasm of unspecified part of unspecified bronchus or lung: C34.90

## 2022-03-26 LAB — RAD ONC ARIA SESSION SUMMARY
Course Elapsed Days: 1
Plan Fractions Treated to Date: 2
Plan Prescribed Dose Per Fraction: 2.5 Gy
Plan Total Fractions Prescribed: 14
Plan Total Prescribed Dose: 35 Gy
Reference Point Dosage Given to Date: 5 Gy
Reference Point Session Dosage Given: 2.5 Gy
Session Number: 2

## 2022-03-26 NOTE — Progress Notes (Signed)
Hematology and Oncology Follow Up Visit  Harry Brown [id number] 1940-09-24 81 y.o. 03/26/2022   Principle Diagnosis:  Extensive stage small cell lung cancer-CNS metastasis  Current Therapy:   Cranial radiotherapy     Interim History:  Harry Brown is back for second office visit.  We do now have a diagnosis.  He did undergo a bronchoscopy.  This was done on 03/16/2022.  The pathology report ([id number]) showed small cell carcinoma consistent with small cell bronchogenic carcinoma.  As part of our work-up, we did do an MRI of the brain.  He was asymptomatic.  However, he did have multiple small brain metastasis.  He currently has been seen by Radiation Oncology and is started radiation therapy.  We did do a PET scan on him.  This was done on 03/24/2022.  This showed hypermetabolic right lower lobe pulmonary nodules.  Hypermetabolic right hilar, mediastinal and right supraclavicular lymph nodes.  There was no obvious metastatic disease.  Of course, radiology commented that there was some focal activity in the rectosigmoid colon.  He still looks good.  He feels good.  He has little bit of a tremor.  I do not know if this might be from steroids that he could be taking.  There is no cough or shortness of breath.  His appetite is doing okay.  He has had no nausea or vomiting.  There is no change in bowel or bladder habits.  He has had no visual changes.  He has had no problems with his speech.  He has had maybe a little bit of balance issues.  He has not fallen.  There is no chest wall pain.  His performance status is still quite good.  I would say his performance status is probably ECOG 1.    Medications:  Current Outpatient Medications:    atenolol (TENORMIN) 25 MG tablet, Take 1 tablet (25 mg total) by mouth daily., Disp: 90 tablet, Rfl: 0   atorvastatin (LIPITOR) 40 MG tablet, Take 1 tablet (40 mg total) by mouth daily., Disp: 90 tablet, Rfl: 0   Cholecalciferol (VITAMIN  D) 50 MCG (2000 UT) tablet, Take 2,000 Units by mouth daily. , Disp: , Rfl:    dexamethasone (DECADRON) 4 MG tablet, Take 1 tablet (4 mg total) by mouth daily., Disp: 30 tablet, Rfl: 0   ELIQUIS 5 MG TABS tablet, TAKE ONE (1) TABLET BY MOUTH TWO (2) TIMES DAILY, Disp: 60 tablet, Rfl: 5   latanoprost (XALATAN) 0.005 % ophthalmic solution, Place 1 drop into both eyes at bedtime. , Disp: , Rfl:    levothyroxine (SYNTHROID) 75 MCG tablet, TAKE ONE (1) TABLET BY MOUTH EVERY DAY, Disp: 90 tablet, Rfl: 0   loratadine (CLARITIN) 10 MG tablet, Take 10 mg by mouth daily as needed for allergies., Disp: , Rfl:    losartan (COZAAR) 25 MG tablet, Take 1 tablet (25 mg total) by mouth daily., Disp: 90 tablet, Rfl: 1   metFORMIN (GLUCOPHAGE-XR) 500 MG 24 hr tablet, TAKE TWO (2) TABLETS BY MOUTH EVERY MORNING WITH BREAKFAST, Disp: 180 tablet, Rfl: 1   nitroGLYCERIN (NITROSTAT) 0.4 MG SL tablet, Place 1 tablet (0.4 mg total) under the tongue every 5 (five) minutes as needed for chest pain., Disp: 25 tablet, Rfl: 11   ONETOUCH DELICA LANCETS 12I MISC, USE TO CHECK BLOOD SUGAR ONCE DAILY, Disp: 100 each, Rfl: 1   ONETOUCH ULTRA test strip, USE 1 STRIP DAILY, Disp: 100 each, Rfl: 12   tamsulosin (FLOMAX) 0.4 MG CAPS  capsule, Take 1 capsule (0.4 mg total) by mouth daily., Disp: 90 capsule, Rfl: 1   vitamin B-12 (CYANOCOBALAMIN) 1000 MCG tablet, Take 1,000 mcg by mouth daily., Disp: , Rfl:   Allergies:  Allergies  Allergen Reactions   Adhesive [Tape] Rash   Latex Rash   Other Hives, Swelling and Rash    Plastic shopping bags Metal staples: rash, swelling    Past Medical History, Surgical history, Social history, and Family History were reviewed and updated.  Review of Systems: Review of Systems  Constitutional: Negative.   HENT:  Negative.    Eyes: Negative.   Respiratory: Negative.    Cardiovascular: Negative.   Gastrointestinal: Negative.   Endocrine: Negative.   Genitourinary: Negative.     Musculoskeletal: Negative.   Skin: Negative.   Neurological: Negative.   Hematological: Negative.   Psychiatric/Behavioral: Negative.      Physical Exam:  height is 6' (1.829 m) and weight is 178 lb (80.7 kg). His oral temperature is 98 F (36.7 C). His blood pressure is 133/72 and his pulse is 93. His respiration is 18 and oxygen saturation is 100%.   Wt Readings from Last 3 Encounters:  03/25/22 178 lb (80.7 kg)  03/23/22 183 lb 6 oz (83.2 kg)  03/23/22 183 lb 12.8 oz (83.4 kg)    Physical Exam Vitals reviewed.  HENT:     Head: Normocephalic and atraumatic.  Eyes:     Pupils: Pupils are equal, round, and reactive to light.  Cardiovascular:     Rate and Rhythm: Normal rate and regular rhythm.     Heart sounds: Normal heart sounds.  Pulmonary:     Effort: Pulmonary effort is normal.     Breath sounds: Normal breath sounds.  Abdominal:     General: Bowel sounds are normal.     Palpations: Abdomen is soft.  Musculoskeletal:        General: No tenderness or deformity. Normal range of motion.     Cervical back: Normal range of motion.  Lymphadenopathy:     Cervical: No cervical adenopathy.  Skin:    General: Skin is warm and dry.     Findings: No erythema or rash.  Neurological:     Mental Status: He is alert and oriented to person, place, and time.  Psychiatric:        Behavior: Behavior normal.        Thought Content: Thought content normal.        Judgment: Judgment normal.      Lab Results  Component Value Date   WBC 8.3 03/11/2022   HGB 11.8 (L) 03/11/2022   HCT 36.0 (L) 03/11/2022   MCV 92.8 03/11/2022   PLT 215 03/11/2022     Chemistry      Component Value Date/Time   NA 137 03/11/2022 1032   NA 139 02/04/2022 0921   K 4.6 03/11/2022 1032   CL 105 03/11/2022 1032   CO2 26 03/11/2022 1032   BUN 21 03/11/2022 1032   BUN 21 02/04/2022 0921   CREATININE 1.05 03/11/2022 1032   CREATININE 1.16 (H) 04/03/2020 1044      Component Value Date/Time    CALCIUM 9.6 03/11/2022 1032   ALKPHOS 81 03/11/2022 1032   AST 23 03/11/2022 1032   ALT 18 03/11/2022 1032   BILITOT 0.6 03/11/2022 1032      Impression and Plan: Harry Brown is a very nice 81 year old white male.  He had a past history of tobacco use.  Surprisingly,  he has small cell lung cancer.  It is metastasized to the brain.  When we first saw him, he was still asymptomatic.  He now is getting radiation therapy.  From what he says, the radiation will finish up on November 7.  I think that we still can get a good response.  I think that he has extensive stage disease.  However, I may think of him also has limited stage disease without need to treat him systemically.  I think that he would probably benefit from both chemotherapy/immunotherapy and radiotherapy.  I think that if he relapses systemically, it probably would be in the thoracic area given the bulk of his disease.  I think that we probably should consider treating him with chemoimmunotherapy initially and then see about radiotherapy afterwards.  He is scheduled for a Port-A-Cath.  We will see about doing the Port-A-Cath may be a little bit later than this Monday.  We probably would not start chemoimmunotherapy until right after Thanksgiving.  Again I want him to finish up his radiotherapy and then see how he is doing and see how his blood counts are.  I would probably would use carboplatinum/VP-16 along with immunotherapy.  I think this would be a very reasonable approach.  I did go over side effects with him and his family.  The biggest fear would be neutropenia.  He would be on Neulasta to try to help with his blood counts.  He will clearly need to be on prophylactic antibiotics.  I told him that he may also need to be transfused.  I would not be surprised if he did given his age.  Hopefully, he will have problems with diarrhea or constipation.  Hopefully will not have a lot of problems with nausea or vomiting.  I would  like to see him back after he finishes his radiotherapy.  We will check his blood counts at that time.  I would like to think that we should still have a good 80-85% chance of a good response and maybe getting into remission.     Volanda Napoleon, MD 10/20/20237:12 AM

## 2022-03-26 NOTE — Progress Notes (Signed)
START ON PATHWAY REGIMEN - Small Cell Lung     Cycles 1 through 4, every 21 days:     Atezolizumab      Carboplatin      Etoposide    Cycles 5 and beyond, every 21 days:     Atezolizumab   **Always confirm dose/schedule in your pharmacy ordering system**  Patient Characteristics: Newly Diagnosed, Preoperative or Nonsurgical Candidate (Clinical Staging), First Line, Extensive Stage Therapeutic Status: Newly Diagnosed, Preoperative or Nonsurgical Candidate (Clinical Staging) AJCC T Category: cT2 AJCC N Category: cN3 AJCC M Category: cM1 AJCC 8 Stage Grouping: IV Stage Classification: Extensive  Intent of Therapy: Non-Curative / Palliative Intent, Discussed with Patient

## 2022-03-26 NOTE — Progress Notes (Signed)
Patient will begin chemo after Thanksgiving. Dr Marin Olp would like his port appointment delayed until closer to that time. Rescheduled port to 04/19/22. He also needs chemo education which is scheduled on 04/20/22. Referral for social work and nutrition placed.   Called patient and reviewed all his appointments. Went over appointment dates, time, locations and instructions. He confirmed teaching with teach back.   Oncology Nurse Navigator Documentation     03/26/2022    8:45 AM  Oncology Nurse Navigator Flowsheets  Navigator Follow Up Date: 04/23/2022  Navigator Follow Up Reason: Follow-up Appointment  Navigator Location [facility]  Navigator Encounter Type Appt/Treatment Plan Review;Telephone  Telephone Appt Confirmation/Clarification;Education;Outgoing Call  Treatment Initiated Date 03/25/2022  Patient Visit Type MedOnc  Treatment Phase Active Tx  Barriers/Navigation Needs Coordination of Care;Education  Education Other  Interventions Coordination of Care;Education;Psycho-Social Support;Referrals  Acuity Level 2-Minimal Needs (1-2 Barriers Identified)  Referrals Nutrition/dietician;Social Work  English as a second language teacher of Care Appts;Radiology  Education Method Verbal;Teach-back  Support Groups/Services Friends and Family  Time Spent with Patient 45

## 2022-03-27 ENCOUNTER — Other Ambulatory Visit: Payer: Self-pay

## 2022-03-29 ENCOUNTER — Ambulatory Visit
Admission: RE | Admit: 2022-03-29 | Discharge: 2022-03-29 | Disposition: A | Discharge status: Home / Self Care | Payer: Medicare Other | Source: Ambulatory Visit | Attending: Radiation Oncology | Admitting: Radiation Oncology

## 2022-03-29 ENCOUNTER — Other Ambulatory Visit: Payer: Medicare Other

## 2022-03-29 ENCOUNTER — Other Ambulatory Visit: Payer: Self-pay

## 2022-03-29 ENCOUNTER — Ambulatory Visit: Payer: Medicare Other

## 2022-03-29 ENCOUNTER — Inpatient Hospital Stay: Payer: Medicare Other

## 2022-03-29 ENCOUNTER — Ambulatory Visit (HOSPITAL_COMMUNITY): Payer: Medicare Other

## 2022-03-29 ENCOUNTER — Other Ambulatory Visit (HOSPITAL_COMMUNITY): Payer: Medicare Other

## 2022-03-29 DIAGNOSIS — Z510 Encounter for antineoplastic radiation therapy: Secondary | ICD-10-CM | POA: Diagnosis not present

## 2022-03-29 DIAGNOSIS — C7931 Secondary malignant neoplasm of brain: Secondary | ICD-10-CM | POA: Diagnosis not present

## 2022-03-29 LAB — RAD ONC ARIA SESSION SUMMARY
Course Elapsed Days: 4
Plan Fractions Treated to Date: 3
Plan Prescribed Dose Per Fraction: 2.5 Gy
Plan Total Fractions Prescribed: 14
Plan Total Prescribed Dose: 35 Gy
Reference Point Dosage Given to Date: 7.5 Gy
Reference Point Session Dosage Given: 2.5 Gy
Session Number: 3

## 2022-03-29 NOTE — Progress Notes (Signed)
[place] Work  Initial Assessment   YAHSHUA THIBAULT Sr. is a 81 y.o. year old male contacted by phone. Clinical Social Work was referred by nurse navigator for assessment of psychosocial needs.   SDOH (Social Determinants of Health) assessments performed: Yes SDOH Interventions    Flowsheet Row Clinical Support from 03/29/2022 in [facility] Chronic [facility] Management from 04/09/2021 in [facility] [place] Management from 10/07/2020 in [facility] [place] Interventions     Utilities Interventions Intervention Not Indicated -- --  Financial Strain Interventions -- Other (Comment)  [provided patient assistance application] --  [Plan to apply for patient assistance for Eliquis once he reaches 3% OOP spend.]  Physical Activity Interventions -- -- Patient Refused  [limited by unsteady gait]       SDOH Screenings   Food Insecurity: No Food Insecurity (03/25/2021)  Housing: Low Risk  (03/25/2021)  Transportation Needs: No Transportation Needs (03/25/2021)  Utilities: Not At Risk (03/29/2022)  Alcohol Screen: Low Risk  (03/25/2021)  Depression (PHQ2-9): Low Risk  (03/25/2021)  Financial Resource Strain: Medium Risk (04/09/2021)  Physical Activity: Inactive (10/07/2020)  Social Connections: Socially Integrated (03/25/2021)  Stress: No Stress Concern Present (03/25/2021)  Tobacco Use: Medium Risk (03/26/2022)     Distress Screen completed: No     No data to display            Family/Social Information:  Housing Arrangement: patient lives with his wife, Charlett Nose. Family members/support persons in your life? Family and Friends.  Patient has children that are in the area and one in [place] Transportation concerns: no  Employment: Retired  Income source: Paediatric nurse concerns: No Type of concern: None Food access concerns: no Religious or  spiritual practice: Yes Services Currently in place:  [facility] Medicare  Coping/ Adjustment to diagnosis: Patient understands treatment plan and what happens next? Yes.  CSW encouraged patient to speak with his MD regarding difficulty sleeping.  He said he feels strange but could not articulate how. Concerns about diagnosis and/or treatment: I'm not especially worried about anything Patient reported stressors: Adjusting to my illness Hopes and/or priorities: To be able to sleep better. Patient enjoys time with family/ friends Current coping skills/ strengths: Capable of independent living , Armed forces logistics/support/administrative officer , Scientist, research (life sciences) , General fund of knowledge , and Supportive family/friends     SUMMARY: Current SDOH Barriers:  None per patient  Clinical Social Work Clinical Goal(s):  No clinical social work goals at this time  Interventions: Discussed common feeling and emotions when being diagnosed with cancer, and the importance of support during treatment Informed patient of the support team roles and support services at [facility] Provided [place] contact information and encouraged patient to call with any questions or concerns Provided education and assistance to patient regarding Advance Directives   Follow Up Plan: Patient will contact CSW with any support or resource needs Patient verbalizes understanding of plan: Yes   Rodman Pickle Jonna Dittrich, LCSW

## 2022-03-30 ENCOUNTER — Other Ambulatory Visit: Payer: Self-pay

## 2022-03-30 ENCOUNTER — Ambulatory Visit
Admission: RE | Admit: 2022-03-30 | Discharge: 2022-03-30 | Disposition: A | Discharge status: Home / Self Care | Payer: Medicare Other | Source: Ambulatory Visit | Attending: Radiation Oncology | Admitting: Radiation Oncology

## 2022-03-30 ENCOUNTER — Telehealth: Payer: Self-pay | Admitting: Oncology

## 2022-03-30 DIAGNOSIS — C7931 Secondary malignant neoplasm of brain: Secondary | ICD-10-CM | POA: Diagnosis not present

## 2022-03-30 DIAGNOSIS — C3490 Malignant neoplasm of unspecified part of unspecified bronchus or lung: Secondary | ICD-10-CM

## 2022-03-30 DIAGNOSIS — Z510 Encounter for antineoplastic radiation therapy: Secondary | ICD-10-CM | POA: Diagnosis not present

## 2022-03-30 LAB — RAD ONC ARIA SESSION SUMMARY
Course Elapsed Days: 5
Plan Fractions Treated to Date: 4
Plan Prescribed Dose Per Fraction: 2.5 Gy
Plan Total Fractions Prescribed: 14
Plan Total Prescribed Dose: 35 Gy
Reference Point Dosage Given to Date: 10 Gy
Reference Point Session Dosage Given: 2.5 Gy
Session Number: 4

## 2022-03-30 MED ORDER — SONAFINE EX EMUL
1.0000 | Freq: Once | CUTANEOUS | Status: AC
  Administered 2022-03-30: 1 via TOPICAL

## 2022-03-30 NOTE — Telephone Encounter (Signed)
Called Harry Brown and advised that we have talked to the pharmacist at the cancer center regarding the possible interaction between glaucoma and steroids.  Advised that any increase in interocular pressure is dependent on the dose of steroids.  He is on a low dose and the pharmacist is not concerned.  He verbalized understanding and agreement and did not have any questions.

## 2022-03-31 ENCOUNTER — Ambulatory Visit: Payer: Medicare Other | Admitting: Dietician

## 2022-03-31 ENCOUNTER — Ambulatory Visit
Admission: RE | Admit: 2022-03-31 | Discharge: 2022-03-31 | Disposition: A | Discharge status: Home / Self Care | Payer: Medicare Other | Source: Ambulatory Visit | Attending: Radiation Oncology | Admitting: Radiation Oncology

## 2022-03-31 ENCOUNTER — Other Ambulatory Visit: Payer: Self-pay

## 2022-03-31 DIAGNOSIS — C7931 Secondary malignant neoplasm of brain: Secondary | ICD-10-CM | POA: Diagnosis not present

## 2022-03-31 DIAGNOSIS — Z510 Encounter for antineoplastic radiation therapy: Secondary | ICD-10-CM | POA: Diagnosis not present

## 2022-03-31 DIAGNOSIS — C3431 Malignant neoplasm of lower lobe, right bronchus or lung: Secondary | ICD-10-CM | POA: Diagnosis not present

## 2022-03-31 DIAGNOSIS — Z87891 Personal history of nicotine dependence: Secondary | ICD-10-CM | POA: Diagnosis not present

## 2022-03-31 LAB — RAD ONC ARIA SESSION SUMMARY
Course Elapsed Days: 6
Plan Fractions Treated to Date: 5
Plan Prescribed Dose Per Fraction: 2.5 Gy
Plan Total Fractions Prescribed: 14
Plan Total Prescribed Dose: 35 Gy
Reference Point Dosage Given to Date: 12.5 Gy
Reference Point Session Dosage Given: 2.5 Gy
Session Number: 5

## 2022-03-31 NOTE — Progress Notes (Signed)
Nutrition Assessment: Reached out to patient at home/cell telephone number.   Spoke with patient and wife.  Reason for Assessment: New patient assessment   ASSESSMENT: Patient is an 81 year old male with Extensive stage small cell lung cancer-CNS metastasis.  He is currently receiving radiation to brain, and is planned for chemo immunotherapy after he completes his radiation which is scheduled to finish 04/13/22. Dr. Marin Olp plans to use carboplatin/VP-16 along with immunotherapy and to start treatments after Thanksgiving. He has PMHx that includes DM2, HTN, HLD, Diverticulosis, Anemia, PVD, A fib, OSA, and Ambulatory Dysfunction   Appetite has decreased for several months prior to diagnosis.  Checks FBS 95-125. No food allergies, no longer likes green beans or broccoli Cereal lots of milk Sandwich white (grilled cheese, PBJ, Ham) chips  often won't eat the whole sandwich Spouse cooks pork chops, stuffed peppers, hamburger steak,  Used to love ice cream but not anymore Fluids: Black 3-4 cups, Pepsi regular (doesn't drink daily), Ice sweet tea (half & half), Water with most meals 2 bottles      Nutrition Focused Physical Exam: unable to perform NFPE   Medications: Metformin, Vit B12, Vit D, Decadron, MVI    Labs: 03/11/22 Hgb 11.8, Glucose 188   Anthropometrics: Lost 17# (8.7%) past 6 months  Height: 72" Weight:  03/25/22  178# 02/04/22  190.3# 08/10/21 195.3# UBW: 190-195 BMI: 24.14   Estimated Energy Needs  Kcals: 2400-2800 Protein: 97-122 g Fluid: 3 L   NUTRITION DIAGNOSIS:  Inadequate PO intake calories r/t decreased portions and poor appetite AEB weight loss past 6 months and dietary recall of trends   MALNUTRITION DIAGNOSIS: weight loss not yet significant   INTERVENTION:  Patient had questions regarding his sleeplessness, discussed the power of prayer, decreasing blue lights/screen time, not having any caffeine after 3 pm, and herbal teas, high magnesium foods  and potential use of melatonin Relayed that nutrition services are provided remotely at no extra charge to him. Educated on importance of adequate calorie and protein energy intake  with nutrient dense foods when possible to maintain weight/strength/blood counts  Discussed ways to add calories/protein to foods (whole milk, healthy fats)   Encouraged beef 2-3 x/wk, greens, and grains for anemia Discussed types of  oral nutrition supplement and suggested he star with low carb QD at HS Discussed strategies for adding high protein snacks and constipation Emailed Nutrition Tip sheets for constipation and  anemia with contact information   MONITORING, EVALUATION, GOAL: weight trends, nutrition impact symptoms, PO intake, labs   Next Visit: November 8th remote  April Manson, [place], LDN Registered Dietitian, [place] Part Time Remote (Usual office hours: Tuesday-Thursday) Mobile: [phone] Remote Office: [phone]

## 2022-04-01 ENCOUNTER — Encounter (HOSPITAL_COMMUNITY): Payer: Self-pay

## 2022-04-01 ENCOUNTER — Ambulatory Visit
Admission: RE | Admit: 2022-04-01 | Discharge: 2022-04-01 | Disposition: A | Discharge status: Home / Self Care | Payer: Medicare Other | Source: Ambulatory Visit | Attending: Radiation Oncology | Admitting: Radiation Oncology

## 2022-04-01 ENCOUNTER — Other Ambulatory Visit: Payer: Self-pay

## 2022-04-01 DIAGNOSIS — C7931 Secondary malignant neoplasm of brain: Secondary | ICD-10-CM | POA: Diagnosis not present

## 2022-04-01 DIAGNOSIS — C3490 Malignant neoplasm of unspecified part of unspecified bronchus or lung: Secondary | ICD-10-CM | POA: Diagnosis not present

## 2022-04-01 DIAGNOSIS — Z510 Encounter for antineoplastic radiation therapy: Secondary | ICD-10-CM | POA: Diagnosis not present

## 2022-04-01 LAB — RAD ONC ARIA SESSION SUMMARY
Course Elapsed Days: 7
Plan Fractions Treated to Date: 6
Plan Prescribed Dose Per Fraction: 2.5 Gy
Plan Total Fractions Prescribed: 14
Plan Total Prescribed Dose: 35 Gy
Reference Point Dosage Given to Date: 15 Gy
Reference Point Session Dosage Given: 2.5 Gy
Session Number: 6

## 2022-04-02 ENCOUNTER — Other Ambulatory Visit: Payer: Self-pay

## 2022-04-02 ENCOUNTER — Ambulatory Visit
Admission: RE | Admit: 2022-04-02 | Discharge: 2022-04-02 | Disposition: A | Discharge status: Home / Self Care | Payer: Medicare Other | Source: Ambulatory Visit | Attending: Radiation Oncology | Admitting: Radiation Oncology

## 2022-04-02 DIAGNOSIS — Z510 Encounter for antineoplastic radiation therapy: Secondary | ICD-10-CM | POA: Diagnosis not present

## 2022-04-02 DIAGNOSIS — C7931 Secondary malignant neoplasm of brain: Secondary | ICD-10-CM | POA: Diagnosis not present

## 2022-04-02 LAB — RAD ONC ARIA SESSION SUMMARY
Course Elapsed Days: 8
Plan Fractions Treated to Date: 7
Plan Prescribed Dose Per Fraction: 2.5 Gy
Plan Total Fractions Prescribed: 14
Plan Total Prescribed Dose: 35 Gy
Reference Point Dosage Given to Date: 17.5 Gy
Reference Point Session Dosage Given: 2.5 Gy
Session Number: 7

## 2022-04-05 ENCOUNTER — Other Ambulatory Visit: Payer: Self-pay

## 2022-04-05 ENCOUNTER — Ambulatory Visit
Admission: RE | Admit: 2022-04-05 | Discharge: 2022-04-05 | Disposition: A | Discharge status: Home / Self Care | Payer: Medicare Other | Source: Ambulatory Visit | Attending: Radiation Oncology | Admitting: Radiation Oncology

## 2022-04-05 ENCOUNTER — Encounter (HOSPITAL_COMMUNITY): Payer: Self-pay

## 2022-04-05 DIAGNOSIS — Z510 Encounter for antineoplastic radiation therapy: Secondary | ICD-10-CM | POA: Diagnosis not present

## 2022-04-05 DIAGNOSIS — C7931 Secondary malignant neoplasm of brain: Secondary | ICD-10-CM | POA: Diagnosis not present

## 2022-04-05 LAB — RAD ONC ARIA SESSION SUMMARY
Course Elapsed Days: 11
Plan Fractions Treated to Date: 8
Plan Prescribed Dose Per Fraction: 2.5 Gy
Plan Total Fractions Prescribed: 14
Plan Total Prescribed Dose: 35 Gy
Reference Point Dosage Given to Date: 20 Gy
Reference Point Session Dosage Given: 2.5 Gy
Session Number: 8

## 2022-04-06 ENCOUNTER — Ambulatory Visit
Admission: RE | Admit: 2022-04-06 | Discharge: 2022-04-06 | Disposition: A | Discharge status: Home / Self Care | Payer: Medicare Other | Source: Ambulatory Visit | Attending: Radiation Oncology | Admitting: Radiation Oncology

## 2022-04-06 ENCOUNTER — Other Ambulatory Visit: Payer: Self-pay

## 2022-04-06 DIAGNOSIS — Z510 Encounter for antineoplastic radiation therapy: Secondary | ICD-10-CM | POA: Diagnosis not present

## 2022-04-06 DIAGNOSIS — C7931 Secondary malignant neoplasm of brain: Secondary | ICD-10-CM | POA: Diagnosis not present

## 2022-04-06 LAB — RAD ONC ARIA SESSION SUMMARY
Course Elapsed Days: 12
Plan Fractions Treated to Date: 9
Plan Prescribed Dose Per Fraction: 2.5 Gy
Plan Total Fractions Prescribed: 14
Plan Total Prescribed Dose: 35 Gy
Reference Point Dosage Given to Date: 22.5 Gy
Reference Point Session Dosage Given: 2.5 Gy
Session Number: 9

## 2022-04-07 ENCOUNTER — Ambulatory Visit: Payer: Medicare Other | Admitting: Pulmonary Disease

## 2022-04-07 ENCOUNTER — Ambulatory Visit
Admission: RE | Admit: 2022-04-07 | Discharge: 2022-04-07 | Disposition: A | Discharge status: Home / Self Care | Payer: Medicare Other | Source: Ambulatory Visit | Attending: Radiation Oncology | Admitting: Radiation Oncology

## 2022-04-07 ENCOUNTER — Other Ambulatory Visit: Payer: Self-pay

## 2022-04-07 ENCOUNTER — Encounter: Payer: Self-pay | Admitting: Pulmonary Disease

## 2022-04-07 VITALS — BP 126/68 | HR 65 | Temp 97.9°F | Wt 182.0 lb

## 2022-04-07 DIAGNOSIS — C3490 Malignant neoplasm of unspecified part of unspecified bronchus or lung: Secondary | ICD-10-CM

## 2022-04-07 DIAGNOSIS — R0609 Other forms of dyspnea: Secondary | ICD-10-CM | POA: Diagnosis not present

## 2022-04-07 DIAGNOSIS — Z510 Encounter for antineoplastic radiation therapy: Secondary | ICD-10-CM | POA: Diagnosis not present

## 2022-04-07 DIAGNOSIS — C778 Secondary and unspecified malignant neoplasm of lymph nodes of multiple regions: Secondary | ICD-10-CM

## 2022-04-07 DIAGNOSIS — J90 Pleural effusion, not elsewhere classified: Secondary | ICD-10-CM

## 2022-04-07 DIAGNOSIS — C7931 Secondary malignant neoplasm of brain: Secondary | ICD-10-CM | POA: Insufficient documentation

## 2022-04-07 DIAGNOSIS — C3431 Malignant neoplasm of lower lobe, right bronchus or lung: Secondary | ICD-10-CM | POA: Diagnosis not present

## 2022-04-07 DIAGNOSIS — Z87891 Personal history of nicotine dependence: Secondary | ICD-10-CM | POA: Diagnosis not present

## 2022-04-07 LAB — RAD ONC ARIA SESSION SUMMARY
Course Elapsed Days: 13
Plan Fractions Treated to Date: 10
Plan Prescribed Dose Per Fraction: 2.5 Gy
Plan Total Fractions Prescribed: 14
Plan Total Prescribed Dose: 35 Gy
Reference Point Dosage Given to Date: 25 Gy
Reference Point Session Dosage Given: 2.5 Gy
Session Number: 10

## 2022-04-07 NOTE — Progress Notes (Signed)
@Patient  ID: Harry Brown., male    DOB: [birth year], 81 y.o.   MRN: [id number]  Chief Complaint  Patient presents with   Follow-up    Pt is here for follow up for lung mass and pleural effusion. Pt is here to go over PET scan results as well. PET scan was 03/24/2022    Referring provider: [name], MD  HPI:   81 y.o. man whom are seeing in follow-up for evaluation of mediastinal mass and right-sided pleural effusion with subsequent diagnosis of lung cancer with evidence of metastases including brain metastasis undergoing radiation to the brain.  Most recent radiation oncology note reviewed.  Most recent oncology note reviewed.  Returns for scheduled follow-up.  Doing okay.  A bit fatigued.  Blood sugar was little lower today, 88 compared to usually low 100s.  Unclear why.  In terms of breathing, seems to be doing fine.  No bad dyspnea.  He did undergo thoracentesis at time of bronchoscopy.  Upon questioning as documented telephone encounter shortly after, he does not think it helped too much with his breathing.  He is having trouble sleeping while taking dexamethasone started melatonin yesterday.  Hopefully this will help.  Awakening in the night more a problem then falling asleep.  HPI initial visit: Patient and wife present for visit today.  There is some weakness that developed maybe 3-4 months ago.  At 2 months ago he felt onset of shortness of breath.  Particularly dyspnea with exertion.  Progressed to minimal exertion causing significant shortness of breath.  This prompted ongoing cardiac evaluation.  This was some concern on exam for diminished breath sounds on the right base.  My review and interpretation chest x-ray 02/04/22 showed small right pleural effusion.  From my review interpretation chest x-ray okay 02/18/22 reveals similar to slightly smaller pleural effusion. This prompted CT scan 03/04/22 which revealed on my review and interpretation large mediastinal mass right  paratracheal extending to right hilum all the way to the subcarinal area with a couple of notable right-sided nodules and a moderate right pleural effusion.  This prompted pulmonary consultation.  He smoked quite a lot.  Quit about 10 years ago.  75-pack-year history recorded.  No major issues of breathing prior to this.  PMH: Tobacco abuse in remission, atrial fibrillation on Eliquis, hypertension Surgical history: Cataract surgery, CABG, femoropopliteal bypass, tonsillectomy Family history: Mother with hyperlipidemia, CAD, diabetes, hypertension, father with CAD, asthma Social history: Former smoker, 75-pack-year, quit 2014, lives in [place] / Pulmonary Flowsheets:   ACT:      No data to display           MMRC:     No data to display           Epworth:      No data to display           Tests:   FENO:  No results found for: "NITRICOXIDE"  PFT:     No data to display           WALK:      No data to display           Imaging: Personally reviewed NM PET Image Initial (PI) Skull Base To Thigh  Result Date: 03/24/2022 CLINICAL DATA:  Initial treatment strategy for non-small cell lung cancer. EXAM: NUCLEAR MEDICINE PET SKULL BASE TO THIGH TECHNIQUE: 0.0 mCi F-18 FDG was injected intravenously. Full-ring PET imaging was performed from the skull base  to thigh after the radiotracer. CT data was obtained and used for attenuation correction and anatomic localization. Fasting blood glucose: 108 mg/dl COMPARISON:  Chest CT dated March 04, 2022 FINDINGS: Mediastinal blood pool activity: SUV max 2.5 Liver activity: SUV max 3.4 NECK: No hypermetabolic lymph nodes in the neck. Incidental CT findings: None. CHEST: Three adjacent hypermetabolic solid pulmonary nodules are seen in the right lower lobe and unchanged in size when compared with recent prior chest CT (remeasured in similar plane). Nodule measuring 14 x 7 mm on series 7, image 44 with  SUV max of 9.1, nodule measuring 12 x 10 mm on image 46 with SUV max of 9.6, and nodule measuring 13 x 13 mm on image 49 with SUV max of 8.8. Hypermetabolic right hilar, mediastinal and right supraclavicular lymph nodes, unchanged in size when compared with recent chest CT. Reference right hilar lymph node measuring approximately 2.2 cm in short axis on series 4, image 83 with SUV max of 11.3. Reference right lower paratracheal lymph node measuring 2.5 cm in short axis on series 4, image 71 with SUV max of 2.8. Reference AP window lymph node measuring 2 point 5 cm in short axis on series 4, image 73 with SUV max of 12.3. Reference supraclavicular lymph node measuring 1.0 cm in short axis on series 4, image 53 with SUV max of 9.5. Incidental CT findings: Normal caliber thoracic aorta with moderate calcified plaque. Severe coronary artery calcifications status post CABG. Small to moderate right pleural effusion. ABDOMEN/PELVIS: Rectal wall thickening seen on series 4, image 190 with focal hypermetabolic uptake, SUV max of 16.0. No abnormal hypermetabolic activity within the liver, pancreas, adrenal glands, or spleen. No hypermetabolic lymph nodes in the abdomen or pelvis. Incidental CT findings: Nonobstructive bilateral renal stones. Infrarenal abdominal aortic aneurysm measuring up to 3.2 cm. Severe calcified plaque of the abdominal aorta. Prostatomegaly. SKELETON: No focal hypermetabolic activity to suggest skeletal metastasis. Incidental CT findings: Prior median sternotomy with intact sternal wires. IMPRESSION: 1. Hypermetabolic right lower lobe pulmonary nodules, compatible with known primary lung malignancy. 2. Hypermetabolic right hilar, mediastinal and right supraclavicular lymph nodes, compatible with nodal metastatic disease. 3. No evidence of distant metastatic disease in the abdomen or pelvis. 4. Focal hypermetabolic activity and wall thickening of the rectosigmoid colon. Recommend colonoscopy to exclude  primary colonic malignancy. Electronically Signed   By: Yetta Glassman M.D.   On: 03/24/2022 09:11   MR Brain W Wo Contrast  Result Date: 03/17/2022 CLINICAL DATA:  Non-small cell lung carcinoma staging EXAM: MRI HEAD WITHOUT AND WITH CONTRAST TECHNIQUE: Multiplanar, multiecho pulse sequences of the brain and surrounding structures were obtained without and with intravenous contrast. CONTRAST:  8.24mL GADAVIST GADOBUTROL 1 MMOL/ML IV SOLN COMPARISON:  09/12/2021 FINDINGS: Brain: There are 5-6 minimally enhancing intracranial lesions (best characterized on series 17). The largest lesion is in the posterior paramedian left parietal lobe and measures 18 mm. The next largest lesion is at the right external capsule in measures 12 mm. There is moderate edema surrounding both of these lesions. No midline shift or other mass effect. There is hemorrhage within both of the larger lesions, as well as a few scattered chronic microhemorrhages in a nonspecific pattern. Vascular: Normal flow voids. Skull and upper cervical spine: Normal marrow signal. Sinuses/Orbits: Negative. Other: None. IMPRESSION: Approximately 5-6 minimally enhancing intracranial metastases. Largest lesions are located in the left parietal lobe and right external capsule. Mild edema without midline shift. Electronically Signed   By: Ulyses Jarred  M.D.   On: 03/17/2022 03:13   DG Chest Port 1 View  Result Date: 03/16/2022 CLINICAL DATA:  History of thoracentesis EXAM: PORTABLE CHEST 1 VIEW COMPARISON:  Previous studies including the chest radiograph done on 02/18/2022 FINDINGS: Transverse diameter of heart is slightly increased. There is evidence of previous coronary bypass surgery. There are no signs of pulmonary edema or focal pulmonary consolidation. Head soft tissue interfaces are seen in both lower lung fields. Lung markings could be seen extending beyond these interfaces, possibly suggesting skin folds. There is no pleural effusion or  pneumothorax. There is surgical fusion in lower cervical spine. Food 920 IMPRESSION: There are no signs of pulmonary edema or focal pulmonary consolidation. Electronically Signed   By: Elmer Picker M.D.   On: 03/16/2022 14:14    Lab Results:  CBC    Component Value Date/Time   WBC 8.3 03/11/2022 1032   WBC 8.5 02/18/2022 1509   RBC 3.88 (L) 03/11/2022 1032   HGB 11.8 (L) 03/11/2022 1032   HGB 12.5 (L) 02/04/2022 0921   HCT 36.0 (L) 03/11/2022 1032   HCT 38.1 02/04/2022 0921   PLT 215 03/11/2022 1032   PLT 248 02/04/2022 0921   MCV 92.8 03/11/2022 1032   MCV 94 02/04/2022 0921   MCH 30.4 03/11/2022 1032   MCHC 32.8 03/11/2022 1032   RDW 13.9 03/11/2022 1032   RDW 13.0 02/04/2022 0921   LYMPHSABS 1.7 03/11/2022 1032   MONOABS 0.7 03/11/2022 1032   EOSABS 0.3 03/11/2022 1032   BASOSABS 0.0 03/11/2022 1032    BMET    Component Value Date/Time   NA 137 03/11/2022 1032   NA 139 02/04/2022 0921   K 4.6 03/11/2022 1032   CL 105 03/11/2022 1032   CO2 26 03/11/2022 1032   GLUCOSE 188 (H) 03/11/2022 1032   BUN 21 03/11/2022 1032   BUN 21 02/04/2022 0921   CREATININE 1.05 03/11/2022 1032   CREATININE 1.16 (H) 04/03/2020 1044   CALCIUM 9.6 03/11/2022 1032   GFRNONAA >60 03/11/2022 1032   GFRAA >60 11/22/2019 1602    BNP No results found for: "BNP"  ProBNP    Component Value Date/Time   PROBNP 169.0 (H) 02/18/2022 1509    Specialty Problems       Pulmonary Problems   Sleep apnea   Pleural effusion, malignant   Lung mass   Pleural effusion   Small cell carcinoma of lung metastatic to lymph nodes of multiple sites (HCC)   Small cell lung carcinoma, right (HCC)    Allergies  Allergen Reactions   Adhesive [Tape] Rash   Latex Rash   Other Hives, Swelling and Rash    Plastic shopping bags Metal staples: rash, swelling    Immunization History  Administered Date(s) Administered   Fluad Quad(high Dose 65+) 04/20/2019, 04/03/2020, 04/13/2021   Influenza  Split 03/31/2012   Influenza, High Dose Seasonal PF 03/20/2013, 02/24/2015, 03/11/2016, 03/15/2017, 05/02/2018   Influenza,inj,Quad PF,6+ Mos 01/29/2014   Influenza,trivalent, recombinat, inj, PF 04/06/2011   Influenza-Unspecified 04/06/2011   PFIZER(Purple Top)SARS-COV-2 Vaccination 06/27/2019, 07/18/2019, 03/14/2020   Pfizer Covid-19 Vaccine Bivalent Booster 61yrs & up 04/13/2021   Pneumococcal Conjugate-13 03/20/2013   Pneumococcal Polysaccharide-23 10/11/2004, 02/24/2015   Td 10/12/2003, 06/07/2009   Td (Adult), 2 Lf Tetanus Toxid, Preservative Free 10/12/2003    Past Medical History:  Diagnosis Date   Anemia 09/24/2013   CAD (coronary artery disease)    Cellulitis 05/14/2013   RLE   Diverticulitis    Dyspnea  03/15/22- due to fluid in lung.   ED (erectile dysfunction)    Glaucoma 08/18/2014   History of sleep apnea    had surgery to correct   Hypertension    Hypothyroidism    Medicare annual wellness visit, subsequent 04/13/2013   Myocardial infarction ([place]) 11/05/1985   Nocturia 03/15/2017   Other and unspecified hyperlipidemia    Penile lesion 02/24/2015   Peripheral neuropathy 03/31/2012   Peripheral vascular disease ([place])    Postoperative anemia due to acute blood loss 09/24/2013   Preventative health care 03/13/2016   Sleep apnea 09/16/2017   Small cell carcinoma of lung metastatic to lymph nodes of multiple sites ([place]) 03/26/2022   Small cell lung carcinoma, right ([place]) 03/26/2022   Type II diabetes mellitus ([place])    fasting 100-120   Urinary urgency 11/22/2012    Tobacco History: Social History   Tobacco Use  Smoking Status Former   Packs/day: 1.50   Years: 50.00   Total pack years: 75.00   Types: Cigarettes   Quit date: 2006   Years since quitting: 17.8  Smokeless Tobacco Never  Tobacco Comments   05/14/2013 "quit smoking in ~ 2004; still Uses Comit lozenges"   Counseling given: Not Answered Tobacco comments: 05/14/2013 "quit smoking in ~ 2004;  still Uses Comit lozenges"   Continue to not smoke  Outpatient Encounter Medications as of 04/07/2022  Medication Sig   atenolol (TENORMIN) 25 MG tablet Take 1 tablet (25 mg total) by mouth daily.   atorvastatin (LIPITOR) 40 MG tablet Take 1 tablet (40 mg total) by mouth daily.   Cholecalciferol (VITAMIN D) 50 MCG (2000 UT) tablet Take 2,000 Units by mouth daily.    dexamethasone (DECADRON) 4 MG tablet Take 1 tablet (4 mg total) by mouth daily.   ELIQUIS 5 MG TABS tablet TAKE ONE (1) TABLET BY MOUTH TWO (2) TIMES DAILY   latanoprost (XALATAN) 0.005 % ophthalmic solution Place 1 drop into both eyes at bedtime.    levothyroxine (SYNTHROID) 75 MCG tablet TAKE ONE (1) TABLET BY MOUTH EVERY DAY   loratadine (CLARITIN) 10 MG tablet Take 10 mg by mouth daily as needed for allergies.   losartan (COZAAR) 25 MG tablet Take 1 tablet (25 mg total) by mouth daily.   metFORMIN (GLUCOPHAGE-XR) 500 MG 24 hr tablet TAKE TWO (2) TABLETS BY MOUTH EVERY MORNING WITH BREAKFAST   ONETOUCH DELICA LANCETS 58N MISC USE TO CHECK BLOOD SUGAR ONCE DAILY   ONETOUCH ULTRA test strip USE 1 STRIP DAILY   tamsulosin (FLOMAX) 0.4 MG CAPS capsule Take 1 capsule (0.4 mg total) by mouth daily.   vitamin B-12 (CYANOCOBALAMIN) 1000 MCG tablet Take 1,000 mcg by mouth daily.   nitroGLYCERIN (NITROSTAT) 0.4 MG SL tablet Place 1 tablet (0.4 mg total) under the tongue every 5 (five) minutes as needed for chest pain.   No facility-administered encounter medications on file as of 04/07/2022.     Review of Systems  Review of Systems  N/a Physical Exam  BP 126/68 (BP Location: Right Arm, Patient Position: Sitting, Cuff Size: Normal)   Pulse 65   Temp 97.9 F (36.6 C) (Oral)   Wt 182 lb (82.6 kg)   SpO2 95%   BMI 24.68 kg/m   Wt Readings from Last 5 Encounters:  04/07/22 182 lb (82.6 kg)  03/25/22 178 lb (80.7 kg)  03/23/22 183 lb 6 oz (83.2 kg)  03/23/22 183 lb 12.8 oz (83.4 kg)  03/16/22 190 lb (86.2 kg)    BMI  Readings from Last 5 Encounters:  04/07/22 24.68 kg/m  03/25/22 24.14 kg/m  03/23/22 24.87 kg/m  03/23/22 24.93 kg/m  03/16/22 25.77 kg/m     Physical Exam General: Sitting in chair, no acute distress Eyes: EOMI, no icterus Neck: Supple, no JVP Pulmonary: Diminished in the right base, otherwise clear, normal work of breathing Cardiovascular: Warm, no edema Abdomen: Nondistended, bowel sounds present MSK: No synovitis, no joint effusion Neuro: Normal gait, no weakness Psych: Normal mood, full affect   Assessment & Plan:   Mediastinal lymphadenopathy/mass with right-sided lung nodules and right pleural effusion with subsequent diagnosis of lung cancer on EBUS: -- Appreciate oncology assistance -- Ongoing whole brain radiation, chemotherapy to start soon  Dyspnea on exertion: Did not improve substantially after initial thoracentesis.  Would offer again if recurs to see if this is more beneficial.   Return in about 3 months (around 07/08/2022).   Lanier Clam, MD 04/07/2022   This appointment required 43 minutes of patient care (this includes precharting, chart review, review of results, face-to-face care, etc.).

## 2022-04-07 NOTE — Patient Instructions (Signed)
Nice to see you again  Try the melatonin an hour or 2 before sleeping  I am hopeful that the waking up at night will improve as we decrease the dexamethasone dose  No changes to medications  Return to clinic in 3 months or sooner as needed with Dr. Silas Flood

## 2022-04-08 ENCOUNTER — Other Ambulatory Visit: Payer: Self-pay

## 2022-04-08 ENCOUNTER — Ambulatory Visit
Admission: RE | Admit: 2022-04-08 | Discharge: 2022-04-08 | Disposition: A | Discharge status: Home / Self Care | Payer: Medicare Other | Source: Ambulatory Visit | Attending: Radiation Oncology | Admitting: Radiation Oncology

## 2022-04-08 DIAGNOSIS — Z510 Encounter for antineoplastic radiation therapy: Secondary | ICD-10-CM | POA: Diagnosis not present

## 2022-04-08 DIAGNOSIS — C7931 Secondary malignant neoplasm of brain: Secondary | ICD-10-CM | POA: Diagnosis not present

## 2022-04-08 LAB — RAD ONC ARIA SESSION SUMMARY
Course Elapsed Days: 14
Plan Fractions Treated to Date: 11
Plan Prescribed Dose Per Fraction: 2.5 Gy
Plan Total Fractions Prescribed: 14
Plan Total Prescribed Dose: 35 Gy
Reference Point Dosage Given to Date: 27.5 Gy
Reference Point Session Dosage Given: 2.5 Gy
Session Number: 11

## 2022-04-09 ENCOUNTER — Encounter: Payer: Self-pay | Admitting: *Deleted

## 2022-04-09 ENCOUNTER — Other Ambulatory Visit: Payer: Self-pay

## 2022-04-09 ENCOUNTER — Ambulatory Visit
Admission: RE | Admit: 2022-04-09 | Discharge: 2022-04-09 | Disposition: A | Discharge status: Home / Self Care | Payer: Medicare Other | Source: Ambulatory Visit | Attending: Radiation Oncology | Admitting: Radiation Oncology

## 2022-04-09 DIAGNOSIS — C7931 Secondary malignant neoplasm of brain: Secondary | ICD-10-CM | POA: Diagnosis not present

## 2022-04-09 DIAGNOSIS — Z510 Encounter for antineoplastic radiation therapy: Secondary | ICD-10-CM | POA: Diagnosis not present

## 2022-04-09 LAB — RAD ONC ARIA SESSION SUMMARY
Course Elapsed Days: 15
Plan Fractions Treated to Date: 12
Plan Prescribed Dose Per Fraction: 2.5 Gy
Plan Total Fractions Prescribed: 14
Plan Total Prescribed Dose: 35 Gy
Reference Point Dosage Given to Date: 30 Gy
Reference Point Session Dosage Given: 2.5 Gy
Session Number: 12

## 2022-04-09 NOTE — Progress Notes (Signed)
Received notification from [facility] that they had still not received a specimen for testing. Contacted our path lab and found that another provider had sent for [place] and results were in the chart. Results printed and given to Dr Marin Olp. Request sent to [facility] to cancel request.  Oncology Nurse Navigator Documentation     04/09/2022   11:15 AM  Oncology Nurse Navigator Flowsheets  Navigator Follow Up Date: 04/23/2022  Navigator Follow Up Reason: Follow-up Appointment  Navigator Location [facility]  Navigator Encounter Type Molecular Studies  Patient Visit Type MedOnc  Treatment Phase Active Tx  Barriers/Navigation Needs Coordination of Care;Education  Interventions Coordination of Care  Acuity Level 2-Minimal Needs (1-2 Barriers Identified)  Coordination of Care Pathology  Support Groups/Services Friends and Family  Time Spent with Patient 15

## 2022-04-12 ENCOUNTER — Ambulatory Visit
Admission: RE | Admit: 2022-04-12 | Discharge: 2022-04-12 | Disposition: A | Discharge status: Home / Self Care | Payer: Medicare Other | Source: Ambulatory Visit | Attending: Radiation Oncology | Admitting: Radiation Oncology

## 2022-04-12 ENCOUNTER — Other Ambulatory Visit: Payer: Self-pay

## 2022-04-12 DIAGNOSIS — Z510 Encounter for antineoplastic radiation therapy: Secondary | ICD-10-CM | POA: Diagnosis not present

## 2022-04-12 DIAGNOSIS — C7931 Secondary malignant neoplasm of brain: Secondary | ICD-10-CM | POA: Diagnosis not present

## 2022-04-12 LAB — RAD ONC ARIA SESSION SUMMARY
Course Elapsed Days: 18
Plan Fractions Treated to Date: 13
Plan Prescribed Dose Per Fraction: 2.5 Gy
Plan Total Fractions Prescribed: 14
Plan Total Prescribed Dose: 35 Gy
Reference Point Dosage Given to Date: 32.5 Gy
Reference Point Session Dosage Given: 2.5 Gy
Session Number: 13

## 2022-04-13 ENCOUNTER — Encounter: Payer: Self-pay | Admitting: Radiation Oncology

## 2022-04-13 ENCOUNTER — Ambulatory Visit
Admission: RE | Admit: 2022-04-13 | Discharge: 2022-04-13 | Disposition: A | Discharge status: Home / Self Care | Payer: Medicare Other | Source: Ambulatory Visit | Attending: Radiation Oncology | Admitting: Radiation Oncology

## 2022-04-13 ENCOUNTER — Other Ambulatory Visit: Payer: Self-pay

## 2022-04-13 DIAGNOSIS — C3431 Malignant neoplasm of lower lobe, right bronchus or lung: Secondary | ICD-10-CM | POA: Diagnosis not present

## 2022-04-13 DIAGNOSIS — C7931 Secondary malignant neoplasm of brain: Secondary | ICD-10-CM | POA: Diagnosis not present

## 2022-04-13 DIAGNOSIS — Z87891 Personal history of nicotine dependence: Secondary | ICD-10-CM | POA: Diagnosis not present

## 2022-04-13 DIAGNOSIS — Z510 Encounter for antineoplastic radiation therapy: Secondary | ICD-10-CM | POA: Diagnosis not present

## 2022-04-13 LAB — RAD ONC ARIA SESSION SUMMARY
Course Elapsed Days: 19
Plan Fractions Treated to Date: 14
Plan Prescribed Dose Per Fraction: 2.5 Gy
Plan Total Fractions Prescribed: 14
Plan Total Prescribed Dose: 35 Gy
Reference Point Dosage Given to Date: 35 Gy
Reference Point Session Dosage Given: 2.5 Gy
Session Number: 14

## 2022-04-13 NOTE — Progress Notes (Signed)
Call placed to [facility] to refill Dexamethasone per Dr. Sondra Come. Patient to take 4mg  x 1, then 2 mg x 5 days, then 2mg  QOD x 8 days then stop. Patient aware.

## 2022-04-14 ENCOUNTER — Inpatient Hospital Stay: Payer: Medicare Other | Attending: Hematology & Oncology | Admitting: Dietician

## 2022-04-14 ENCOUNTER — Ambulatory Visit (INDEPENDENT_AMBULATORY_CARE_PROVIDER_SITE_OTHER): Payer: Medicare Other | Admitting: *Deleted

## 2022-04-14 DIAGNOSIS — Z5111 Encounter for antineoplastic chemotherapy: Secondary | ICD-10-CM | POA: Insufficient documentation

## 2022-04-14 DIAGNOSIS — Z0000 Encounter for general adult medical examination without abnormal findings: Secondary | ICD-10-CM

## 2022-04-14 DIAGNOSIS — M25551 Pain in right hip: Secondary | ICD-10-CM | POA: Insufficient documentation

## 2022-04-14 DIAGNOSIS — C7931 Secondary malignant neoplasm of brain: Secondary | ICD-10-CM | POA: Insufficient documentation

## 2022-04-14 DIAGNOSIS — C3490 Malignant neoplasm of unspecified part of unspecified bronchus or lung: Secondary | ICD-10-CM | POA: Insufficient documentation

## 2022-04-14 DIAGNOSIS — I4891 Unspecified atrial fibrillation: Secondary | ICD-10-CM | POA: Insufficient documentation

## 2022-04-14 DIAGNOSIS — R0602 Shortness of breath: Secondary | ICD-10-CM | POA: Insufficient documentation

## 2022-04-14 DIAGNOSIS — Z79899 Other long term (current) drug therapy: Secondary | ICD-10-CM | POA: Insufficient documentation

## 2022-04-14 DIAGNOSIS — R630 Anorexia: Secondary | ICD-10-CM | POA: Insufficient documentation

## 2022-04-14 NOTE — Patient Instructions (Signed)
Harry Brown , Thank you for taking time to come for your Medicare Wellness Visit. I appreciate your ongoing commitment to your health goals. Please review the following plan we discussed and let me know if I can assist you in the future.   These are the goals we discussed:  Goals      Chronic Care Management Pharmacy Care Plan     CARE PLAN ENTRY (see longitudinal plan of care for additional care plan information)  Current Barriers:  Chronic Disease Management support, education, and care coordination needs related to Hypertension, Hyperlipidemia/Atherosclerosis/PVD, Diabetes, Afib, Hypothyroidism, Allergic Rhinitis, BPH, Glaucoma   Hypertension BP Readings from Last 3 Encounters:  09/04/21 123/74  08/25/21 (!) 118/56  08/10/21 116/62  Pharmacist Clinical Goal(s): Over the next 90 days, patient will work with PharmD and providers to maintain BP goal <130/80 Current regimen:  Atenolol 25mg  daily  Losartan 25mg  daily Interventions: Reviewed goal Requested patient to check blood pressure once weekly and when symptomptic and record Patient self care activities - Over the next 90 days, patient will: Check blood pressure once per week, document, and provide at future appointments Ensure daily salt intake < 2300 mg/day  Hyperlipidemia/Atherosclerosis/PVD Lab Results  Component Value Date/Time   LDLCALC 76 12/16/2021 01:56 PM   [place] 67 04/03/2020 10:44 AM   LDLDIRECT 77.0 12/16/2021 01:56 PM  Pharmacist Clinical Goal(s): Over the next 90 days, patient will work with PharmD and providers to achieve LDL goal < 70 Current regimen:  Atorvastatin 40mg  daily Interventions: Discussed diet and exercise Discussed LDL goal Patient self care activities - Over the next 90 days, patient will: Encourage you to be as active as possible - continue with physical therapy Maintain cholesterol medication regimen.   Diabetes Lab Results  Component Value Date/Time   HGBA1C 6.8 (H) 12/16/2021  01:56 PM   HGBA1C 6.7 (H) 09/01/2020 10:57 AM  Pharmacist Clinical Goal(s): Over the next 90 days, patient will work with PharmD and providers to maintain A1c goal <7% Current regimen:  Metformin XR 500mg  - take 2 tablets = 1000mg  each morning Interventions: Discussed diet and exercise Reviewed home blood glucose readings and reviewed goals  Fasting blood glucose goal (before meals) = 80 to 130 Blood glucose goal after a meal = less than 180  Patient self care activities - Over the next 90 days, patient will: Check blood sugar once daily, document, and provide at future appointments Decrease metformin XR to 500mg  once a day with a meal to see if diarrhea improves.  Contact provider with any episodes of hypoglycemia  Artial fibrillation Pharmacist Clinical Goal(s) Over the next 90 days, patient will work with PharmD and providers to reduce risk of stroke associated with Atrial fibrillaiton and reduce barrier to obtaining medication Current regimen:  Eliquis 5mg  twice daily Atenolol 25mg  daily  Interventions: Confirmed with [facility] patient assistance program that patient was approved for Eliquis 05/26/2021 thru 06/06/2021 - he will only get 1 shipment but that will help delay time to coverage gap in 2023.  Patient self care activities - Over the next 90 days, patient will: Continue Eliquis and atenolol.  Notify office when you reach Medicare coverage gap for 2023. We can then move forward with patient assistance program application.   Medication management Pharmacist Clinical Goal(s): Over the next 90 days, patient will work with PharmD and providers to maintain optimal medication adherence Current pharmacy: [facility] Comprehensive medication review performed. Continue current medication management strategy Recommended he could take  atenolol with a little peanut butter, nutella, applesause or yogurt to mask bitter taste Patient self care activities -  Over the next 90 days, patient will: Focus on medication adherence by filling and taking medications appropriately Take medications as prescribed Report any questions or concerns to PharmD and/or provider(s)  Patient Goals/Self-Care Activities Over the next 180 days, patient will:  take medications as prescribed,  check glucose daily, document, and provide at future appointments,  check blood pressure once a week, document, and provide at future appointments,  Notify office when you hit Medicare coverage gap for 2023.  Decrease dose of metformin to Er 500mg  once a day with food for the next 2 weeks to see if diarrhea improves.  Try taking atenolol with a little peanut butter, nutella, applesause or yogurt to mask bitter taste   Please see past updates related to this goal by clicking on the "Past Updates" button in the selected goal       Patient Stated     Maintain current health        This is a list of the screening recommended for you and due dates:  Health Maintenance  Topic Date Due   Zoster (Shingles) Vaccine (1 of 2) Never done   Yearly kidney health urinalysis for diabetes  03/11/2017   Tetanus Vaccine  06/08/2019   COVID-19 Vaccine (5 - Pfizer risk series) 06/08/2021   Complete foot exam   09/01/2021   Flu Shot  01/05/2022   Hemoglobin A1C  06/18/2022   Eye exam for diabetics  11/07/2022   Yearly kidney function blood test for diabetes  03/12/2023   Medicare Annual Wellness Visit  04/15/2023   Pneumonia Vaccine  Completed   HPV Vaccine  Aged Out     Next appointment: Follow up in one year for your annual wellness visit.   Preventive Care 82 Years and Older, Male Preventive care refers to lifestyle choices and visits with your health care provider that can promote health and wellness. What does preventive care include? A yearly physical exam. This is also called an annual well check. Dental exams once or twice a year. Routine eye exams. Ask your health care  provider how often you should have your eyes checked. Personal lifestyle choices, including: Daily care of your teeth and gums. Regular physical activity. Eating a healthy diet. Avoiding tobacco and drug use. Limiting alcohol use. Practicing safe sex. Taking low doses of aspirin every day. Taking vitamin and mineral supplements as recommended by your health care provider. What happens during an annual well check? The services and screenings done by your health care provider during your annual well check will depend on your age, overall health, lifestyle risk factors, and family history of disease. Counseling  Your health care provider may ask you questions about your: Alcohol use. Tobacco use. Drug use. Emotional well-being. Home and relationship well-being. Sexual activity. Eating habits. History of falls. Memory and ability to understand (cognition). Work and work Statistician. Screening  You may have the following tests or measurements: Height, weight, and BMI. Blood pressure. Lipid and cholesterol levels. These may be checked every 5 years, or more frequently if you are over 97 years old. Skin check. Lung cancer screening. You may have this screening every year starting at age 9 if you have a 30-pack-year history of smoking and currently smoke or have quit within the past 15 years. Fecal occult blood test (FOBT) of the stool. You may have this test every year starting at age  50. Flexible sigmoidoscopy or colonoscopy. You may have a sigmoidoscopy every 5 years or a colonoscopy every 10 years starting at age 55. Prostate cancer screening. Recommendations will vary depending on your family history and other risks. Hepatitis C blood test. Hepatitis B blood test. Sexually transmitted disease (STD) testing. Diabetes screening. This is done by checking your blood sugar (glucose) after you have not eaten for a while (fasting). You may have this done every 1-3 years. Abdominal aortic  aneurysm (AAA) screening. You may need this if you are a current or former smoker. Osteoporosis. You may be screened starting at age 49 if you are at high risk. Talk with your health care provider about your test results, treatment options, and if necessary, the need for more tests. Vaccines  Your health care provider may recommend certain vaccines, such as: Influenza vaccine. This is recommended every year. Tetanus, diphtheria, and acellular pertussis (Tdap, Td) vaccine. You may need a Td booster every 10 years. Zoster vaccine. You may need this after age 85. Pneumococcal 13-valent conjugate (PCV13) vaccine. One dose is recommended after age 70. Pneumococcal polysaccharide (PPSV23) vaccine. One dose is recommended after age 45. Talk to your health care provider about which screenings and vaccines you need and how often you need them. This information is not intended to replace advice given to you by your health care provider. Make sure you discuss any questions you have with your health care provider. Document Released: 06/20/2015 Document Revised: 02/11/2016 Document Reviewed: 03/25/2015 Elsevier Interactive Patient Education  2017 [place] Prevention in the Home Falls can cause injuries. They can happen to people of all ages. There are many things you can do to make your home safe and to help prevent falls. What can I do on the outside of my home? Regularly fix the edges of walkways and driveways and fix any cracks. Remove anything that might make you trip as you walk through a door, such as a raised step or threshold. Trim any bushes or trees on the path to your home. Use bright outdoor lighting. Clear any walking paths of anything that might make someone trip, such as rocks or tools. Regularly check to see if handrails are loose or broken. Make sure that both sides of any steps have handrails. Any raised decks and porches should have guardrails on the edges. Have any leaves,  snow, or ice cleared regularly. Use sand or salt on walking paths during winter. Clean up any spills in your garage right away. This includes oil or grease spills. What can I do in the bathroom? Use night lights. Install grab bars by the toilet and in the tub and shower. Do not use towel bars as grab bars. Use non-skid mats or decals in the tub or shower. If you need to sit down in the shower, use a plastic, non-slip stool. Keep the floor dry. Clean up any water that spills on the floor as soon as it happens. Remove soap buildup in the tub or shower regularly. Attach bath mats securely with double-sided non-slip rug tape. Do not have throw rugs and other things on the floor that can make you trip. What can I do in the bedroom? Use night lights. Make sure that you have a light by your bed that is easy to reach. Do not use any sheets or blankets that are too big for your bed. They should not hang down onto the floor. Have a firm chair that has side arms. You can use  this for support while you get dressed. Do not have throw rugs and other things on the floor that can make you trip. What can I do in the kitchen? Clean up any spills right away. Avoid walking on wet floors. Keep items that you use a lot in easy-to-reach places. If you need to reach something above you, use a strong step stool that has a grab bar. Keep electrical cords out of the way. Do not use floor polish or wax that makes floors slippery. If you must use wax, use non-skid floor wax. Do not have throw rugs and other things on the floor that can make you trip. What can I do with my stairs? Do not leave any items on the stairs. Make sure that there are handrails on both sides of the stairs and use them. Fix handrails that are broken or loose. Make sure that handrails are as long as the stairways. Check any carpeting to make sure that it is firmly attached to the stairs. Fix any carpet that is loose or worn. Avoid having throw  rugs at the top or bottom of the stairs. If you do have throw rugs, attach them to the floor with carpet tape. Make sure that you have a light switch at the top of the stairs and the bottom of the stairs. If you do not have them, ask someone to add them for you. What else can I do to help prevent falls? Wear shoes that: Do not have high heels. Have rubber bottoms. Are comfortable and fit you well. Are closed at the toe. Do not wear sandals. If you use a stepladder: Make sure that it is fully opened. Do not climb a closed stepladder. Make sure that both sides of the stepladder are locked into place. Ask someone to hold it for you, if possible. Clearly mark and make sure that you can see: Any grab bars or handrails. First and last steps. Where the edge of each step is. Use tools that help you move around (mobility aids) if they are needed. These include: Canes. Walkers. Scooters. Crutches. Turn on the lights when you go into a dark area. Replace any light bulbs as soon as they burn out. Set up your furniture so you have a clear path. Avoid moving your furniture around. If any of your floors are uneven, fix them. If there are any pets around you, be aware of where they are. Review your medicines with your doctor. Some medicines can make you feel dizzy. This can increase your chance of falling. Ask your doctor what other things that you can do to help prevent falls. This information is not intended to replace advice given to you by your health care provider. Make sure you discuss any questions you have with your health care provider. Document Released: 03/20/2009 Document Revised: 10/30/2015 Document Reviewed: 06/28/2014 Elsevier Interactive Patient Education  2017 [employer].

## 2022-04-14 NOTE — Progress Notes (Signed)
Subjective:   Harry Angst Sr. is a 81 y.o. male who presents for Medicare Annual/Subsequent preventive examination.  I connected with  Harry Angst Sr. on 04/14/22 by a audio enabled telemedicine application and verified that I am speaking with the correct person using two identifiers.  Patient Location: Home  Provider Location: Office/Clinic  I discussed the limitations of evaluation and management by telemedicine. The patient expressed understanding and agreed to proceed.   Review of Systems    Defer to PCP Cardiac Risk Factors include: advanced age (>74men, >61 women);diabetes mellitus;dyslipidemia;male gender;hypertension     Objective:    There were no vitals filed for this visit. There is no height or weight on file to calculate BMI.     04/14/2022    3:44 PM 03/25/2022    4:04 PM 03/23/2022   12:31 PM 03/11/2022   11:16 AM 09/08/2021    3:01 PM 09/04/2021    1:14 PM 03/25/2021    3:05 PM  Advanced Directives  Does Patient Have a Medical Advance Directive? Yes No No No No No No  Type of Paramedic of [place];Living will        Copy of [place] in Chart? No - copy requested        Would patient like information on creating a medical advance directive? No - Patient declined No - Patient declined  No - Patient declined Yes (MAU/Ambulatory/Procedural Areas - Information given)  Yes (MAU/Ambulatory/Procedural Areas - Information given)    Current Medications (verified) Outpatient Encounter Medications as of 04/14/2022  Medication Sig   atenolol (TENORMIN) 25 MG tablet Take 1 tablet (25 mg total) by mouth daily.   atorvastatin (LIPITOR) 40 MG tablet Take 1 tablet (40 mg total) by mouth daily.   Cholecalciferol (VITAMIN D) 50 MCG (2000 UT) tablet Take 2,000 Units by mouth daily.    dexamethasone (DECADRON) 4 MG tablet Take 1 tablet (4 mg total) by mouth daily.   ELIQUIS 5 MG TABS tablet TAKE ONE (1) TABLET BY MOUTH TWO  (2) TIMES DAILY   latanoprost (XALATAN) 0.005 % ophthalmic solution Place 1 drop into both eyes at bedtime.    levothyroxine (SYNTHROID) 75 MCG tablet TAKE ONE (1) TABLET BY MOUTH EVERY DAY   loratadine (CLARITIN) 10 MG tablet Take 10 mg by mouth daily as needed for allergies.   losartan (COZAAR) 25 MG tablet Take 1 tablet (25 mg total) by mouth daily.   metFORMIN (GLUCOPHAGE-XR) 500 MG 24 hr tablet TAKE TWO (2) TABLETS BY MOUTH EVERY MORNING WITH BREAKFAST   nitroGLYCERIN (NITROSTAT) 0.4 MG SL tablet Place 1 tablet (0.4 mg total) under the tongue every 5 (five) minutes as needed for chest pain.   ONETOUCH DELICA LANCETS 10U MISC USE TO CHECK BLOOD SUGAR ONCE DAILY   ONETOUCH ULTRA test strip USE 1 STRIP DAILY   tamsulosin (FLOMAX) 0.4 MG CAPS capsule Take 1 capsule (0.4 mg total) by mouth daily.   vitamin B-12 (CYANOCOBALAMIN) 1000 MCG tablet Take 1,000 mcg by mouth daily.   No facility-administered encounter medications on file as of 04/14/2022.    Allergies (verified) Adhesive [tape], Latex, and Other   History: Past Medical History:  Diagnosis Date   Anemia 09/24/2013   CAD (coronary artery disease)    Cellulitis 05/14/2013   RLE   Diverticulitis    Dyspnea    03/15/22- due to fluid in lung.   ED (erectile dysfunction)    Glaucoma 08/18/2014   History  of sleep apnea    had surgery to correct   Hypertension    Hypothyroidism    Medicare annual wellness visit, subsequent 04/13/2013   Myocardial infarction ([place]) 11/05/1985   Nocturia 03/15/2017   Other and unspecified hyperlipidemia    Penile lesion 02/24/2015   Peripheral neuropathy 03/31/2012   Peripheral vascular disease ([place])    Postoperative anemia due to acute blood loss 09/24/2013   Preventative health care 03/13/2016   Sleep apnea 09/16/2017   Small cell carcinoma of lung metastatic to lymph nodes of multiple sites ([place]) 03/26/2022   Small cell lung carcinoma, right ([place]) 03/26/2022   Type II diabetes mellitus  ([place])    fasting 100-120   Urinary urgency 11/22/2012   Past Surgical History:  Procedure Laterality Date   ABDOMINAL AORTAGRAM  Oct. 9, 2014   Dr. Bridgett Larsson   ABDOMINAL Maxcine Ham N/A 03/15/2013   Procedure: ABDOMINAL Maxcine Ham;  Surgeon: Conrad [place], MD;  Location: [facility] CATH LAB;  Service: Cardiovascular;  Laterality: N/A;   ANTERIOR CERVICAL DECOMP/DISCECTOMY FUSION  ~ 2000   CARDIOVERSION N/A 12/03/2019   Procedure: CARDIOVERSION;  Surgeon: Acie Fredrickson, Wonda Cheng, MD;  Location: [facility] ENDOSCOPY;  Service: Cardiovascular;  Laterality: N/A;   CATARACT EXTRACTION Right    CORONARY ARTERY BYPASS GRAFT  2001   "CABG X3" (05/14/2013)   DENTAL SURGERY     "multiple teeth removed; trmimed top of mouth so dentures would fit" (05/14/2013)   EYE SURGERY     cataracts   FEMORAL-POPLITEAL BYPASS GRAFT Right 04/25/2013   Procedure: RIGHT FEMORAL TO ABOVE KNEE POPLITEAL BYPASS GRAFT WITH RIGHT [place]; ULTRASOUND GUIDED;  Surgeon: Conrad [place], MD;  Location: [facility];  Service: Vascular;  Laterality: Right;   FINE NEEDLE ASPIRATION  03/16/2022   Procedure: FINE NEEDLE ASPIRATION;  Surgeon: Candee Furbish, MD;  Location: [facility] ENDOSCOPY;  Service: Pulmonary;;   GUM SURGERY  [phone]   "had bone scraped; had periodontal disease" (05/14/2013)   PALATE / UVULA BIOPSY / EXCISION  2003   Removed due to sleep apnea   TEE WITHOUT CARDIOVERSION N/A 06/13/2020   Procedure: TRANSESOPHAGEAL ECHOCARDIOGRAM (TEE);  Surgeon: Geralynn Rile, MD;  Location: [facility];  Service: Cardiovascular;  Laterality: N/A;   THORACENTESIS N/A 03/16/2022   Procedure: Mathews Robinsons;  Surgeon: Candee Furbish, MD;  Location: [facility] ENDOSCOPY;  Service: Pulmonary;  Laterality: N/A;   TONSILLECTOMY     VIDEO BRONCHOSCOPY WITH ENDOBRONCHIAL ULTRASOUND N/A 03/16/2022   Procedure: VIDEO BRONCHOSCOPY WITH ENDOBRONCHIAL ULTRASOUND;  Surgeon: Candee Furbish, MD;  Location: [facility] ENDOSCOPY;  Service: Pulmonary;  Laterality: N/A;    Family History  Problem Relation Age of Onset   Hyperlipidemia Mother    Heart disease Mother    Diabetes Mother    Hypertension Mother    Heart disease Father    Asthma Father    Arthritis Father    Heart disease Sister    Heart disease Sister        s/p bypass x 2   Lupus Sister    Heart disease Brother        MI waiting on heart transplant when died   Heart disease Brother        massive MI   Cancer Neg Hx    Social History   Socioeconomic History   Marital status: Married    Spouse name: Not on file   Number of children: 8    Years of education: Not on file   Highest  education level: Not on file  Occupational History    Employer: [employer]  Tobacco Use   Smoking status: Former    Packs/day: 1.50    Years: 50.00    Total pack years: 75.00    Types: Cigarettes    Quit date: 2006    Years since quitting: 17.8   Smokeless tobacco: Never   Tobacco comments:    05/14/2013 "quit smoking in ~ 2004; still Uses Comit lozenges"  Vaping Use   Vaping Use: Never used  Substance and Sexual Activity   Alcohol use: No    Alcohol/week: 0.0 standard drinks of alcohol   Drug use: No   Sexual activity: Yes    Comment: lives with wife, retiring end of next week, no dietary restrictions  Other Topics Concern   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 [employer]   Social Determinants of Health   Financial Resource Strain: [place] (04/09/2021)   Overall Financial Resource Strain (CARDIA)    Difficulty of Paying Living Expenses: Somewhat hard  Food Insecurity: No Food Insecurity (04/14/2022)   Hunger Vital Sign    Worried About Running Out of Food in the Last Year: Never true    [place] in the Last Year: Never true  Transportation Needs: No Transportation Needs (04/14/2022)   PRAPARE - Hydrologist (Medical): No    Lack of Transportation (Non-Medical): No   Physical Activity: Inactive (10/07/2020)   Exercise Vital Sign    Days of Exercise per Week: 0 days    Minutes of Exercise per Session: 0 min  Stress: No Stress Concern Present (03/25/2021)   [place] of Stress : Not at all  Social Connections: [place] (03/25/2021)   Social Connection and Isolation Panel [NHANES]    Frequency of Communication with Friends and Family: More than three times a week    Frequency of Social Gatherings with Friends and Family: More than three times a week    Attends Religious Services: More than 4 times per year    Active Member of [employer] or Organizations: Yes    Attends Music therapist: More than 4 times per year    Marital Status: Married    Tobacco Counseling Counseling given: Not Answered Tobacco comments: 05/14/2013 "quit smoking in ~ 2004; still Uses Comit lozenges"   Clinical Intake:  Pre-visit preparation completed: Yes  Pain : No/denies pain  How often do you need to have someone help you when you read instructions, pamphlets, or other written materials from your doctor or pharmacy?: 1 - Never   Nutrition Risk Assessment:  Has the patient had any N/V/D within the last 2 months?  No  Does the patient have any non-healing wounds?  No  Has the patient had any unintentional weight loss or weight gain?  Yes , loss of appetite  Diabetes:  Is the patient diabetic?  Yes  If diabetic, was a CBG obtained today?  No  Did the patient bring in their glucometer from home?   No, audio visit How often do you monitor your CBG's? Once a day.   Financial Strains and Diabetes Management:  Are you having any financial strains with the device, your supplies or your medication? No .  Does the patient want to be seen by Chronic Care Management for management of  their diabetes?  No  Would the patient like to be referred to a Nutritionist or for Diabetic  Management?  No   Diabetic Exams:  Diabetic Eye Exam: Completed 11/06/21 Diabetic Foot Exam: Overdue, Pt has been advised about the importance in completing this exam. Pt is scheduled for diabetic foot exam on N/a.  Interpreter Needed?: No  Information entered by :: Beatris Ship, [place]   Activities of Daily Living    04/14/2022    3:46 PM  In your present state of health, do you have any difficulty performing the following activities:  Hearing? 1  Comment slight hearing loss  Vision? 0  Difficulty concentrating or making decisions? 0  Walking or climbing stairs? 0  Dressing or bathing? 0  Doing errands, shopping? 0  Preparing Food and eating ? N  Using the Toilet? N  In the past six months, have you accidently leaked urine? N  Do you have problems with loss of bowel control? N  Managing your Medications? N  Managing your Finances? N  Housekeeping or managing your Housekeeping? N    Patient Care Team: [name], MD as PCP - General (Family Medicine) Stanford Breed Denice Bors, MD as PCP - Cardiology (Cardiology) Conrad [place], MD as Consulting Physician (Vascular Surgery) Calvert Cantor, MD as Consulting Physician (Ophthalmology) Malachy Chamber, DDS as Consulting Physician (Periodontics) Cherre Robins, [place] (Pharmacist) Volanda Napoleon, MD as Medical Oncologist (Oncology) Cordelia Poche, RN as Oncology Nurse Navigator  Indicate any recent Medical Services you may have received from other than [facility] providers in the past year (date may be approximate).     Assessment:   This is a routine wellness examination for Harry Brown.  Hearing/Vision screen No results found.  Dietary issues and exercise activities discussed: Current Exercise Habits: The patient does not participate in regular exercise at present, Exercise limited by: None identified   Goals Addressed   None    Depression Screen    04/14/2022    3:50 PM 03/25/2021    3:08 PM 07/01/2020    1:47 PM 05/15/2019     9:48 AM 05/09/2018    9:20 AM 09/13/2016   10:02 AM 03/11/2016    1:24 PM  PHQ 2/9 Scores  PHQ - 2 Score 1 0 0 0 0 0 0    Fall Risk    04/14/2022    3:45 PM 09/24/2021    9:17 AM 09/04/2021    1:14 PM 03/25/2021    3:07 PM 10/07/2020    2:19 PM  Fall Risk   Falls in the past year? 1 1 1  0 0  Number falls in past yr: 1 1 1  0 0  Injury with Fall? 0 0 0 0   Risk for fall due to : History of fall(s);Impaired balance/gait History of fall(s);Impaired balance/gait   Impaired balance/gait  Follow up Falls evaluation completed Falls evaluation completed;Falls prevention discussed  Falls prevention discussed Falls evaluation completed  Comment  patient will continue to see PT and use cane       FALL RISK PREVENTION PERTAINING TO THE HOME:  Any stairs in or around the home? Yes  If so, are there any without handrails? No  Home free of loose throw rugs in walkways, pet beds, electrical cords, etc? Yes  Adequate lighting in your home to reduce risk of falls? Yes   ASSISTIVE DEVICES UTILIZED TO PREVENT FALLS:  Life alert? No  Use of a cane, walker or w/c? No  Grab bars in  the bathroom? Yes  Shower chair or bench in shower? No  Elevated toilet seat or a handicapped toilet? No   TIMED UP AND GO:  Was the test performed?  No, audio visit .    Cognitive Function:    09/13/2016   10:03 AM 03/11/2016    2:25 PM  MMSE - Mini Mental State Exam  Orientation to time 5 5  Orientation to Place 5 5  Registration 3 3  Attention/ Calculation 4 5  Recall 2 3  Language- name 2 objects 2 2  Language- repeat 1 1  Language- follow 3 step command 3 3  Language- read & follow direction 1 1  Write a sentence 1 1  Copy design 1 1  Total score 28 30        04/14/2022    3:51 PM  6CIT Screen  What Year? 0 points  What month? 0 points  What time? 0 points  Count back from 20 0 points  Months in reverse 0 points  Repeat phrase 2 points  Total Score 2 points    Immunizations Immunization  History  Administered Date(s) Administered   Fluad Quad(high Dose 65+) 04/20/2019, 04/03/2020, 04/13/2021   Influenza Split 03/31/2012   Influenza, High Dose Seasonal PF 03/20/2013, 02/24/2015, 03/11/2016, 03/15/2017, 05/02/2018   Influenza,inj,Quad PF,6+ Mos 01/29/2014   Influenza,trivalent, recombinat, inj, PF 04/06/2011   Influenza-Unspecified 04/06/2011   PFIZER(Purple Top)SARS-COV-2 Vaccination 06/27/2019, 07/18/2019, 03/14/2020   Pfizer Covid-19 Vaccine Bivalent Booster 68yrs & up 04/13/2021   Pneumococcal Conjugate-13 03/20/2013   Pneumococcal Polysaccharide-23 10/11/2004, 02/24/2015   Td 10/12/2003, 06/07/2009   Td (Adult), 2 Lf Tetanus Toxid, Preservative Free 10/12/2003    TDAP status: Due, Education has been provided regarding the importance of this vaccine. Advised may receive this vaccine at local pharmacy or Health Dept. Aware to provide a copy of the vaccination record if obtained from local pharmacy or Health Dept. Verbalized acceptance and understanding.  Flu Vaccine status: Due, Education has been provided regarding the importance of this vaccine. Advised may receive this vaccine at local pharmacy or Health Dept. Aware to provide a copy of the vaccination record if obtained from local pharmacy or Health Dept. Verbalized acceptance and understanding.  Pneumococcal vaccine status: Up to date  Covid-19 vaccine status: Information provided on how to obtain vaccines.   Qualifies for Shingles Vaccine? Yes   Zostavax completed No   Shingrix Completed?: No.    Education has been provided regarding the importance of this vaccine. Patient has been advised to call insurance company to determine out of pocket expense if they have not yet received this vaccine. Advised may also receive vaccine at local pharmacy or Health Dept. Verbalized acceptance and understanding.  Screening Tests Health Maintenance  Topic Date Due   Zoster Vaccines- Shingrix (1 of 2) Never done   Diabetic  kidney evaluation - Urine ACR  03/11/2017   TETANUS/TDAP  06/08/2019   COVID-19 Vaccine (5 - Pfizer risk series) 06/08/2021   FOOT EXAM  09/01/2021   INFLUENZA VACCINE  01/05/2022   Medicare Annual Wellness (AWV)  03/25/2022   HEMOGLOBIN A1C  06/18/2022   OPHTHALMOLOGY EXAM  11/07/2022   Diabetic kidney evaluation - GFR measurement  03/12/2023   Pneumonia Vaccine 25+ Years old  Completed   HPV VACCINES  Aged Out    Health Maintenance  Health Maintenance Due  Topic Date Due   Zoster Vaccines- Shingrix (1 of 2) Never done   Diabetic kidney evaluation - Urine ACR  03/11/2017   TETANUS/TDAP  06/08/2019   COVID-19 Vaccine (5 - Pfizer risk series) 06/08/2021   FOOT EXAM  09/01/2021   INFLUENZA VACCINE  01/05/2022   Medicare Annual Wellness (AWV)  03/25/2022    Colorectal cancer screening: No longer required.   Lung Cancer Screening: (Low Dose CT Chest recommended if Age 6-80 years, 30 pack-year currently smoking OR have quit w/in 15years.) does not qualify.  -Pt currently being treated for stage IV lung cancer   Additional Screening:  Hepatitis C Screening: does not qualify  Vision Screening: Recommended annual ophthalmology exams for early detection of glaucoma and other disorders of the eye. Is the patient up to date with their annual eye exam?  Yes  Who is the provider or what is the name of the office in which the patient attends annual eye exams? [place]. If pt is not established with a provider, would they like to be referred to a provider to establish care? No .   Dental Screening: Recommended annual dental exams for proper oral hygiene  Community Resource Referral / Chronic Care Management: CRR required this visit?  No   CCM required this visit?  No      Plan:     I have personally reviewed and noted the following in the patient's chart:   Medical and social history Use of alcohol, tobacco or illicit drugs  Current medications and supplements  including opioid prescriptions. Patient is not currently taking opioid prescriptions. Functional ability and status Nutritional status Physical activity Advanced directives List of other physicians Hospitalizations, surgeries, and ER visits in previous 12 months Vitals Screenings to include cognitive, depression, and falls Referrals and appointments  In addition, I have reviewed and discussed with patient certain preventive protocols, quality metrics, and best practice recommendations. A written personalized care plan for preventive services as well as general preventive health recommendations were provided to patient.   Due to this being a telephonic visit, the after visit summary with patients personalized plan was offered to patient via mail or my-chart. Per request, patient was mailed a copy of AVS.  Beatris Ship, [place]   04/14/2022   Nurse Notes: None

## 2022-04-14 NOTE — Progress Notes (Signed)
Nutrition Follow-up:  Called patient on home/mobile# and he put it on speaker with wife Bethena Roys.    Still having concerns with appetite but "forcing himself to eat." Has started with Raisin bran with whole milk, and he's enjoying that. Went to [employer] got the lasagna (ate half), wife made [employer], creamed potatoes, asparagus, tomato.  Still doesn't want the green beans he used to love.  Overall doing well.  We talk about how his appetite may change now that his decadron is being tapered. Bought some chocolate milk, cheese and crackers.  Haven't tried any ONS yet.     They are hoping chemo will not start until after Thanksgiving.  They have 23 people coming to home to celebrate.     Medications: Plan for decadron taper next 2 weeks, no other changes  Labs: No new labs  Anthropometrics: Weight has increased slightly with some fluctuations after previous loss of 17# (8.7%) past 6 months   Height: 72" Weight:  04/13/22  180.4# (at final radiation [place]) 04/07/22  182# 03/25/22  178# 02/04/22  190.3# 08/10/21 195.3# UBW: 190-195 BMI: 24.41     Estimated Energy Needs   Kcals: 2400-2800 Protein: 97-122 g Fluid: 3 L     NUTRITION DIAGNOSIS:  Inadequate PO intake calories r/t decreased portions and poor appetite AEB weight loss past 6 months and dietary recall of intake trends. Resolving with his forced snacks and intake.  INTERVENTION:  Reviewed goal of maintenance of LBM and strength.  Continued to encourage regular foods as able, and trial of varied nutrient dense sources.  Briefly reviewed some NIS anticipated with upcoming treatments and that he'd be provided with nutrition book with resources at chemo ed.  Asked to weight prior to Thanks giving and again week after and get in touch if unable to maintain weight or noticed inability to maintain current PO.  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

## 2022-04-15 ENCOUNTER — Other Ambulatory Visit: Payer: Self-pay

## 2022-04-16 ENCOUNTER — Other Ambulatory Visit: Payer: Self-pay | Admitting: Radiology

## 2022-04-16 ENCOUNTER — Other Ambulatory Visit: Payer: Self-pay | Admitting: *Deleted

## 2022-04-16 DIAGNOSIS — C3491 Malignant neoplasm of unspecified part of right bronchus or lung: Secondary | ICD-10-CM

## 2022-04-16 MED ORDER — ONDANSETRON HCL 8 MG PO TABS: 8.0000 mg | ORAL_TABLET | Freq: Three times a day (TID) | ORAL | 1 refills | Status: AC | PRN

## 2022-04-16 MED ORDER — PROCHLORPERAZINE MALEATE 10 MG PO TABS: 10.0000 mg | ORAL_TABLET | Freq: Four times a day (QID) | ORAL | 1 refills | Status: AC | PRN

## 2022-04-16 MED ORDER — DEXAMETHASONE 4 MG PO TABS: ORAL_TABLET | ORAL | 0 refills | Status: DC

## 2022-04-19 ENCOUNTER — Ambulatory Visit (HOSPITAL_COMMUNITY)
Admission: RE | Admit: 2022-04-19 | Discharge: 2022-04-19 | Disposition: A | Discharge status: Home / Self Care | Payer: Medicare Other | Source: Ambulatory Visit | Attending: Hematology & Oncology | Admitting: Hematology & Oncology

## 2022-04-19 ENCOUNTER — Encounter (HOSPITAL_COMMUNITY): Payer: Self-pay

## 2022-04-19 DIAGNOSIS — Z452 Encounter for adjustment and management of vascular access device: Secondary | ICD-10-CM | POA: Diagnosis not present

## 2022-04-19 DIAGNOSIS — C3490 Malignant neoplasm of unspecified part of unspecified bronchus or lung: Secondary | ICD-10-CM

## 2022-04-19 DIAGNOSIS — Z01818 Encounter for other preprocedural examination: Secondary | ICD-10-CM | POA: Diagnosis not present

## 2022-04-19 HISTORY — PX: IR IMAGING GUIDED PORT INSERTION: IMG5740

## 2022-04-19 LAB — GLUCOSE, CAPILLARY: Glucose-Capillary: 78 mg/dL (ref 70–99)

## 2022-04-19 MED ORDER — HEPARIN SOD (PORK) LOCK FLUSH 100 UNIT/ML IV SOLN
INTRAVENOUS | Status: AC
  Administered 2022-04-19: 500 [IU] via INTRAVENOUS
  Filled 2022-04-19: qty 5

## 2022-04-19 MED ORDER — SODIUM CHLORIDE 0.9 % IV SOLN: INTRAVENOUS | Status: DC

## 2022-04-19 MED ORDER — FENTANYL CITRATE (PF) 100 MCG/2ML IJ SOLN
INTRAMUSCULAR | Status: AC
  Filled 2022-04-19: qty 2

## 2022-04-19 MED ORDER — MIDAZOLAM HCL 2 MG/2ML IJ SOLN
INTRAMUSCULAR | Status: AC | PRN
  Administered 2022-04-19: .5 mg via INTRAVENOUS

## 2022-04-19 MED ORDER — MIDAZOLAM HCL 2 MG/2ML IJ SOLN
INTRAMUSCULAR | Status: AC
  Filled 2022-04-19: qty 2

## 2022-04-19 MED ORDER — FENTANYL CITRATE (PF) 100 MCG/2ML IJ SOLN
INTRAMUSCULAR | Status: AC | PRN
  Administered 2022-04-19: 25 ug via INTRAVENOUS

## 2022-04-19 MED ORDER — LIDOCAINE-EPINEPHRINE 1 %-1:100000 IJ SOLN: 20.0000 mL | Freq: Once | INTRAMUSCULAR | Status: DC

## 2022-04-19 MED ORDER — LIDOCAINE-EPINEPHRINE 1 %-1:100000 IJ SOLN
INTRAMUSCULAR | Status: AC
  Administered 2022-04-19: 14 mL via INTRADERMAL
  Filled 2022-04-19: qty 1

## 2022-04-19 NOTE — Consult Note (Signed)
Chief Complaint: Patient was seen in consultation today for  port a cath placement  Referring Physician(s): Ennever,Peter R  Supervising Physician: Jacqulynn Cadet  Patient Status: [facility] - Out-pt  History of Present Illness: Harry Brown. is an 81 y.o. male , ex smoker, with past medical history significant for anemia, coronary artery disease prior MI/CABG, diverticulitis, glaucoma, sleep apnea, hypertension, hypothyroidism, peripheral neuropathy, peripheral vascular disease, diabetes and now with newly diagnosed metastatic small cell lung cancer.  He presents today for Port-A-Cath placement to assist with treatment.  Past Medical History:  Diagnosis Date   Anemia 09/24/2013   CAD (coronary artery disease)    Cellulitis 05/14/2013   RLE   Diverticulitis    Dyspnea    03/15/22- due to fluid in lung.   ED (erectile dysfunction)    Glaucoma 08/18/2014   History of sleep apnea    had surgery to correct   Hypertension    Hypothyroidism    Medicare annual wellness visit, subsequent 04/13/2013   Myocardial infarction ([place]) 11/05/1985   Nocturia 03/15/2017   Other and unspecified hyperlipidemia    Penile lesion 02/24/2015   Peripheral neuropathy 03/31/2012   Peripheral vascular disease ([place])    Postoperative anemia due to acute blood loss 09/24/2013   Preventative health care 03/13/2016   Sleep apnea 09/16/2017   Small cell carcinoma of lung metastatic to lymph nodes of multiple sites ([place]) 03/26/2022   Small cell lung carcinoma, right ([place]) 03/26/2022   Type II diabetes mellitus ([place])    fasting 100-120   Urinary urgency 11/22/2012    Past Surgical History:  Procedure Laterality Date   ABDOMINAL AORTAGRAM  Oct. 9, 2014   Dr. Bridgett Larsson   ABDOMINAL Maxcine Ham N/A 03/15/2013   Procedure: ABDOMINAL Maxcine Ham;  Surgeon: Conrad Miller Place, MD;  Location: [facility] CATH LAB;  Service: Cardiovascular;  Laterality: N/A;   ANTERIOR CERVICAL DECOMP/DISCECTOMY FUSION  ~ 2000    CARDIOVERSION N/A 12/03/2019   Procedure: CARDIOVERSION;  Surgeon: Acie Fredrickson, Wonda Cheng, MD;  Location: [facility] ENDOSCOPY;  Service: Cardiovascular;  Laterality: N/A;   CATARACT EXTRACTION Right    CORONARY ARTERY BYPASS GRAFT  2001   "CABG X3" (05/14/2013)   DENTAL SURGERY     "multiple teeth removed; trmimed top of mouth so dentures would fit" (05/14/2013)   EYE SURGERY     cataracts   FEMORAL-POPLITEAL BYPASS GRAFT Right 04/25/2013   Procedure: RIGHT FEMORAL TO ABOVE KNEE POPLITEAL BYPASS GRAFT WITH RIGHT [place]; ULTRASOUND GUIDED;  Surgeon: Conrad Enfield, MD;  Location: [place];  Service: Vascular;  Laterality: Right;   FINE NEEDLE ASPIRATION  03/16/2022   Procedure: FINE NEEDLE ASPIRATION;  Surgeon: Candee Furbish, MD;  Location: [facility] ENDOSCOPY;  Service: Pulmonary;;   GUM SURGERY  [phone]   "had bone scraped; had periodontal disease" (05/14/2013)   PALATE / UVULA BIOPSY / EXCISION  2003   Removed due to sleep apnea   TEE WITHOUT CARDIOVERSION N/A 06/13/2020   Procedure: TRANSESOPHAGEAL ECHOCARDIOGRAM (TEE);  Surgeon: Geralynn Rile, MD;  Location: [place];  Service: Cardiovascular;  Laterality: N/A;   THORACENTESIS N/A 03/16/2022   Procedure: Mathews Robinsons;  Surgeon: Candee Furbish, MD;  Location: [facility] ENDOSCOPY;  Service: Pulmonary;  Laterality: N/A;   TONSILLECTOMY     VIDEO BRONCHOSCOPY WITH ENDOBRONCHIAL ULTRASOUND N/A 03/16/2022   Procedure: VIDEO BRONCHOSCOPY WITH ENDOBRONCHIAL ULTRASOUND;  Surgeon: Candee Furbish, MD;  Location: [facility] ENDOSCOPY;  Service: Pulmonary;  Laterality: N/A;    Allergies: Adhesive [  tape], Latex, and Other  Medications: Prior to Admission medications   Medication Sig Start Date End Date Taking? Authorizing Provider  atenolol (TENORMIN) 25 MG tablet Take 1 tablet (25 mg total) by mouth daily. 01/25/22   [name], MD  atorvastatin (LIPITOR) 40 MG tablet Take 1 tablet (40 mg total) by mouth daily. 01/25/22   [name], MD   Cholecalciferol (VITAMIN D) 50 MCG (2000 UT) tablet Take 2,000 Units by mouth daily.     [provider]  dexamethasone (DECADRON) 4 MG tablet Take 1 tablet (4 mg total) by mouth daily. 03/23/22   Gery Pray, MD  dexamethasone (DECADRON) 4 MG tablet Take 2 tabs by mouth starting day after last dose of etoposide for one day only. Repeat every 21 days. Take with food. 04/16/22   Volanda Napoleon, MD  ELIQUIS 5 MG TABS tablet TAKE ONE (1) TABLET BY MOUTH TWO (2) TIMES DAILY 02/11/22   [name], MD  latanoprost (XALATAN) 0.005 % ophthalmic solution Place 1 drop into both eyes at bedtime.  08/19/14   [provider]  levothyroxine (SYNTHROID) 75 MCG tablet TAKE ONE (1) TABLET BY MOUTH EVERY DAY 02/05/22   [name], MD  loratadine (CLARITIN) 10 MG tablet Take 10 mg by mouth daily as needed for allergies.    [provider]  losartan (COZAAR) 25 MG tablet Take 1 tablet (25 mg total) by mouth daily. 09/24/21   [name], MD  metFORMIN (GLUCOPHAGE-XR) 500 MG 24 hr tablet TAKE TWO (2) TABLETS BY MOUTH EVERY MORNING WITH BREAKFAST 10/22/21   [name], MD  nitroGLYCERIN (NITROSTAT) 0.4 MG SL tablet Place 1 tablet (0.4 mg total) under the tongue every 5 (five) minutes as needed for chest pain. 08/07/18 03/11/22  Revankar, Reita Cliche, MD  ondansetron (ZOFRAN) 8 MG tablet Take 1 tablet (8 mg total) by mouth every 8 (eight) hours as needed for nausea, vomiting or refractory nausea / vomiting. Start on the third day after carboplatin. 04/16/22   Volanda Napoleon, MD  [facility] DELICA LANCETS 58N MISC USE TO CHECK BLOOD SUGAR ONCE DAILY 08/02/18   [name], MD  [facility] ULTRA test strip USE 1 STRIP DAILY 01/11/22   [name], MD  prochlorperazine (COMPAZINE) 10 MG tablet Take 1 tablet (10 mg total) by mouth every 6 (six) hours as needed for nausea or vomiting. 04/16/22   Volanda Napoleon, MD  tamsulosin (FLOMAX) 0.4 MG CAPS capsule Take 1 capsule (0.4 mg total) by  mouth daily. 09/24/21   [name], MD  vitamin B-12 (CYANOCOBALAMIN) 1000 MCG tablet Take 1,000 mcg by mouth daily.    [provider]     Family History  Problem Relation Age of Onset   Hyperlipidemia Mother    Heart disease Mother    Diabetes Mother    Hypertension Mother    Heart disease Father    Asthma Father    Arthritis Father    Heart disease Sister    Heart disease Sister        s/p bypass x 2   Lupus Sister    Heart disease Brother        MI waiting on heart transplant when died   Heart disease Brother        massive MI   Cancer Neg Hx     Social History   Socioeconomic History   Marital status: Married    Spouse name: Not on  file   Number of children: 8    Years of education: Not on file   Highest education level: Not on file  Occupational History    Employer: [employer]  Tobacco Use   Smoking status: Former    Packs/day: 1.50    Years: 50.00    Total pack years: 75.00    Types: Cigarettes    Quit date: 2006    Years since quitting: 17.8   Smokeless tobacco: Never   Tobacco comments:    05/14/2013 "quit smoking in ~ 2004; still Uses Comit lozenges"  Vaping Use   Vaping Use: Never used  Substance and Sexual Activity   Alcohol use: No    Alcohol/week: 0.0 standard drinks of alcohol   Drug use: No   Sexual activity: Yes    Comment: lives with wife, retiring end of next week, no dietary restrictions  Other Topics Concern   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 [employer]   Social Determinants of Health   Financial Resource Strain: [place] (04/09/2021)   Overall Financial Resource Strain (CARDIA)    Difficulty of Paying Living Expenses: Somewhat hard  Food Insecurity: No Food Insecurity (04/14/2022)   Hunger Vital Sign    Worried About Running Out of Food in the Last Year: Never true    [place] in the Last Year: Never true  Transportation Needs: No  Transportation Needs (04/14/2022)   PRAPARE - Hydrologist (Medical): No    Lack of Transportation (Non-Medical): No  Physical Activity: Inactive (10/07/2020)   Exercise Vital Sign    Days of Exercise per Week: 0 days    Minutes of Exercise per Session: 0 min  Stress: No Stress Concern Present (03/25/2021)   [place]    Feeling of Stress : Not at all  Social Connections: [place] (03/25/2021)   Social Connection and Isolation Panel [NHANES]    Frequency of Communication with Friends and Family: More than three times a week    Frequency of Social Gatherings with Friends and Family: More than three times a week    Attends Religious Services: More than 4 times per year    Active Member of [employer] or Organizations: Yes    Attends Music therapist: More than 4 times per year    Marital Status: Married     Review of Systems currently denies fever, chest pain, worsening dyspnea, cough, abdominal/back pain, nausea, vomiting or bleeding.  He does have a mild headache.  Vital Signs: BP (!) 127/56   Pulse 83   Temp 97.7 F (36.5 C) (Oral)   Resp 18   SpO2 97%      Physical Exam awake, answering simple questions okay.  Chest with diminished breath sounds right base, scattered rhonchi.  Heart with irregularly irregular rhythm.  Abdomen soft, positive bowel sounds, nontender.No sig LE edema  Imaging: NM PET Image Initial (PI) Skull Base To Thigh  Result Date: 03/24/2022 CLINICAL DATA:  Initial treatment strategy for non-small cell lung cancer. EXAM: NUCLEAR MEDICINE PET SKULL BASE TO THIGH TECHNIQUE: 0.0 mCi F-18 FDG was injected intravenously. Full-ring PET imaging was performed from the skull base to thigh after the radiotracer. CT data was obtained and used for attenuation correction and anatomic localization. Fasting blood glucose: 108 mg/dl COMPARISON:  Chest CT dated  March 04, 2022  FINDINGS: Mediastinal blood pool activity: SUV max 2.5 Liver activity: SUV max 3.4 NECK: No hypermetabolic lymph nodes in the neck. Incidental CT findings: None. CHEST: Three adjacent hypermetabolic solid pulmonary nodules are seen in the right lower lobe and unchanged in size when compared with recent prior chest CT (remeasured in similar plane). Nodule measuring 14 x 7 mm on series 7, image 44 with SUV max of 9.1, nodule measuring 12 x 10 mm on image 46 with SUV max of 9.6, and nodule measuring 13 x 13 mm on image 49 with SUV max of 8.8. Hypermetabolic right hilar, mediastinal and right supraclavicular lymph nodes, unchanged in size when compared with recent chest CT. Reference right hilar lymph node measuring approximately 2.2 cm in short axis on series 4, image 83 with SUV max of 11.3. Reference right lower paratracheal lymph node measuring 2.5 cm in short axis on series 4, image 71 with SUV max of 2.8. Reference AP window lymph node measuring 2 point 5 cm in short axis on series 4, image 73 with SUV max of 12.3. Reference supraclavicular lymph node measuring 1.0 cm in short axis on series 4, image 53 with SUV max of 9.5. Incidental CT findings: Normal caliber thoracic aorta with moderate calcified plaque. Severe coronary artery calcifications status post CABG. Small to moderate right pleural effusion. ABDOMEN/PELVIS: Rectal wall thickening seen on series 4, image 190 with focal hypermetabolic uptake, SUV max of 16.0. No abnormal hypermetabolic activity within the liver, pancreas, adrenal glands, or spleen. No hypermetabolic lymph nodes in the abdomen or pelvis. Incidental CT findings: Nonobstructive bilateral renal stones. Infrarenal abdominal aortic aneurysm measuring up to 3.2 cm. Severe calcified plaque of the abdominal aorta. Prostatomegaly. SKELETON: No focal hypermetabolic activity to suggest skeletal metastasis. Incidental CT findings: Prior median sternotomy with intact sternal wires.  IMPRESSION: 1. Hypermetabolic right lower lobe pulmonary nodules, compatible with known primary lung malignancy. 2. Hypermetabolic right hilar, mediastinal and right supraclavicular lymph nodes, compatible with nodal metastatic disease. 3. No evidence of distant metastatic disease in the abdomen or pelvis. 4. Focal hypermetabolic activity and wall thickening of the rectosigmoid colon. Recommend colonoscopy to exclude primary colonic malignancy. Electronically Signed   By: Yetta Glassman M.D.   On: 03/24/2022 09:11    Labs:  CBC: Recent Labs    01/26/22 1723 02/04/22 0921 02/18/22 1509 03/11/22 1032  WBC 9.8 8.1 8.5 8.3  HGB 12.2* 12.5* 12.8* 11.8*  HCT 36.5* 38.1 38.8* 36.0*  PLT 240.0 248 235.0 215    COAGS: No results for input(s): "INR", "APTT" in the last 8760 hours.  BMP: Recent Labs    01/26/22 1723 02/04/22 0921 02/18/22 1509 03/11/22 1032  NA 138 139 139 137  K 4.6 4.7 4.5 4.6  CL 102 104 103 105  CO2 25 26 27 26   GLUCOSE 74 120* 87 188*  BUN 22 21 21 21   CALCIUM 9.2 9.8 9.8 9.6  CREATININE 1.03 1.09 1.06 1.05  GFRNONAA  --   --   --  >60    LIVER FUNCTION TESTS: Recent Labs    08/10/21 1633 02/18/22 1509 03/11/22 1032  BILITOT 0.6 0.5 0.6  AST 23 24 23   ALT 19 19 18   ALKPHOS 79 90 81  PROT 7.1 7.1 7.0  ALBUMIN 4.1 4.0 4.0    TUMOR MARKERS: No results for input(s): "AFPTM", "CEA", "CA199", "CHROMGRNA" in the last 8760 hours.  Assessment and Plan: 81 y.o. male , ex smoker, with past medical history significant for anemia, coronary  artery disease prior MI/CABG, diverticulitis, glaucoma, sleep apnea, hypertension, hypothyroidism, peripheral neuropathy, peripheral vascular disease, diabetes and now with newly diagnosed metastatic small cell lung cancer.  He presents today for Port-A-Cath placement to assist with treatment.Risks and benefits of image guided port-a-catheter placement was discussed with the patient including, but not limited to bleeding,  infection, pneumothorax, or fibrin sheath development and need for additional procedures.  All of the patient's questions were answered, patient is agreeable to proceed. Consent signed and in chart.    Thank you for this interesting consult.  I greatly enjoyed meeting Harry CATTERTON Sr. and look forward to participating in their care.  A copy of this report was sent to the requesting provider on this date.  Electronically Signed: D. Rowe Robert, PA-C 04/19/2022, 12:20 PM   I spent a total of  25 minutes   in face to face in clinical consultation, greater than 50% of which was counseling/coordinating care for port a cath placement

## 2022-04-19 NOTE — Discharge Instructions (Signed)
Moderate Conscious Sedation, Adult, Care After This sheet gives you information about how to care for yourself after your procedure. Your health care provider may also give you more specific instructions. If you have problems or questions, contact your health care provider. What can I expect after the procedure? After the procedure, it is common to have: Sleepiness for several hours. Impaired judgment for several hours. Difficulty with balance. Vomiting if you eat too soon. Follow these instructions at home: For the time period you were told by your health care provider:     Rest. Do not participate in activities where you could fall or become injured. Do not drive or use machinery. Do not drink alcohol. Do not take sleeping pills or medicines that cause drowsiness. Do not make important decisions or sign legal documents. Do not take care of children on your own. Eating and drinking  Follow the diet recommended by your health care provider. Drink enough fluid to keep your urine pale yellow. If you vomit: Drink water, juice, or soup when you can drink without vomiting. Make sure you have little or no nausea before eating solid foods. General instructions Take over-the-counter and prescription medicines only as told by your health care provider. Have a responsible adult stay with you for the time you are told. It is important to have someone help care for you until you are awake and alert. Do not smoke. Keep all follow-up visits as told by your health care provider. This is important. Contact a health care provider if: You are still sleepy or having trouble with balance after 24 hours. You feel light-headed. You keep feeling nauseous or you keep vomiting. You develop a rash. You have a fever. You have redness or swelling around the IV site. Get help right away if: You have trouble breathing. You have new-onset confusion at home. Summary After the procedure, it is common to  feel sleepy, have impaired judgment, or feel nauseous if you eat too soon. Rest after you get home. Know the things you should not do after the procedure. Follow the diet recommended by your health care provider and drink enough fluid to keep your urine pale yellow. Get help right away if you have trouble breathing or new-onset confusion at home. This information is not intended to replace advice given to you by your health care provider. Make sure you discuss any questions you have with your health care provider. Document Revised: 09/21/2019 Document Reviewed: 04/19/2019 Elsevier Patient Education  [place] Insertion, Care After The following information offers guidance on how to care for yourself after your procedure. Your health care provider may also give you more specific instructions. If you have problems or questions, contact your health care provider.  Urgent needs- Interventional Radiology clinic- [phone] Wound- May remove dressing in 24-48 hours and shower. Otherwise keep site clean and dry. May replace dressing with clean bandaids as needed.  Your Provider should set up monthly appointments for Port flush.  What can I expect after the procedure? After the procedure, it is common to have: Discomfort at the port insertion site. Bruising on the skin over the port. This should improve over 3-4 days. Follow these instructions at home: [facility] care After your port is placed, you will get a manufacturer's information card. The card has information about your port. Keep this card with you at all times. Take care of the port as told by your health care provider. Ask your health  care provider if you or a family member can get training for taking care of the port at home. A home health care nurse will be be available to help care for the port. Make sure to remember what type of port you have. Incision care     Follow instructions from your health care  provider about how to take care of your port insertion site. Make sure you: Wash your hands with soap and water for at least 20 seconds before and after you change your bandage (dressing). If soap and water are not available, use hand sanitizer. Change your dressing as told by your health care provider. Leave stitches (sutures), skin glue, or adhesive strips in place. These skin closures may need to stay in place for 2 weeks or longer. If adhesive strip edges start to loosen and curl up, you may trim the loose edges. Do not remove adhesive strips completely unless your health care provider tells you to do that. Check your port insertion site every day for signs of infection. Check for: Redness, swelling, or pain. Fluid or blood. Warmth. Pus or a bad smell. Activity Return to your normal activities as told by your health care provider. Ask your health care provider what activities are safe for you. You may have to avoid lifting. Ask your health care provider how much you can safely lift. General instructions Take over-the-counter and prescription medicines only as told by your health care provider. Do not take baths, swim, or use a hot tub until site healed. Ask your health care provider if you may take showers. You may only be allowed to take sponge baths. If you were given a sedative during the procedure, it can affect you for several hours. Do not drive or operate machinery until your health care provider says that it is safe. Wear a medical alert bracelet in case of an emergency. This will tell any health care providers that you have a port. Keep all follow-up visits. This is important. Contact a health care provider if: You cannot flush your port with saline as directed, or you cannot draw blood from the port. You have a fever or chills. You have redness, swelling, or pain around your port insertion site. You have fluid or blood coming from your port insertion site. Your port insertion  site feels warm to the touch. You have pus or a bad smell coming from the port insertion site. Get help right away if: You have chest pain or shortness of breath. You have bleeding from your port that you cannot control. These symptoms may be an emergency. Get help right away. Call 911. Do not wait to see if the symptoms will go away. Do not drive yourself to the hospital. Summary Take care of the port as told by your health care provider. Keep the manufacturer's information card with you at all times. Change your dressing as told by your health care provider. Contact a health care provider if you have a fever or chills or if you have redness, swelling, or pain around your port insertion site. Keep all follow-up visits. This information is not intended to replace advice given to you by your health care provider. Make sure you discuss any questions you have with your health care provider. Document Revised: 11/25/2020 Document Reviewed: 11/25/2020 Elsevier Patient Education  [place] Insertion, Care After The following information offers guidance on how to care for yourself after your procedure. Your health care  provider may also give you more specific instructions. If you have problems or questions, contact your health care provider.  Urgent needs- Interventional Radiology clinic- [phone] Wound- May remove dressing in 24-48 hours and shower. Otherwise keep site clean and dry. May replace dressing with clean bandaids as needed.  Your Provider should set up monthly appointments for Port flush.  What can I expect after the procedure? After the procedure, it is common to have: Discomfort at the port insertion site. Bruising on the skin over the port. This should improve over 3-4 days. Follow these instructions at home: [facility] care After your port is placed, you will get a manufacturer's information card. The card has information about your port. Keep this  card with you at all times. Take care of the port as told by your health care provider. Ask your health care provider if you or a family member can get training for taking care of the port at home. A home health care nurse will be be available to help care for the port. Make sure to remember what type of port you have. Incision care     Follow instructions from your health care provider about how to take care of your port insertion site. Make sure you: Wash your hands with soap and water for at least 20 seconds before and after you change your bandage (dressing). If soap and water are not available, use hand sanitizer. Change your dressing as told by your health care provider. Leave stitches (sutures), skin glue, or adhesive strips in place. These skin closures may need to stay in place for 2 weeks or longer. If adhesive strip edges start to loosen and curl up, you may trim the loose edges. Do not remove adhesive strips completely unless your health care provider tells you to do that. Check your port insertion site every day for signs of infection. Check for: Redness, swelling, or pain. Fluid or blood. Warmth. Pus or a bad smell. Activity Return to your normal activities as told by your health care provider. Ask your health care provider what activities are safe for you. You may have to avoid lifting. Ask your health care provider how much you can safely lift. General instructions Take over-the-counter and prescription medicines only as told by your health care provider. Do not take baths, swim, or use a hot tub until site healed. Ask your health care provider if you may take showers. You may only be allowed to take sponge baths. If you were given a sedative during the procedure, it can affect you for several hours. Do not drive or operate machinery until your health care provider says that it is safe. Wear a medical alert bracelet in case of an emergency. This will tell any health care  providers that you have a port. Keep all follow-up visits. This is important. Contact a health care provider if: You cannot flush your port with saline as directed, or you cannot draw blood from the port. You have a fever or chills. You have redness, swelling, or pain around your port insertion site. You have fluid or blood coming from your port insertion site. Your port insertion site feels warm to the touch. You have pus or a bad smell coming from the port insertion site. Get help right away if: You have chest pain or shortness of breath. You have bleeding from your port that you cannot control. These symptoms may be an emergency. Get help right away. Call 911. Do not wait to see  if the symptoms will go away. Do not drive yourself to the hospital. Summary Take care of the port as told by your health care provider. Keep the manufacturer's information card with you at all times. Change your dressing as told by your health care provider. Contact a health care provider if you have a fever or chills or if you have redness, swelling, or pain around your port insertion site. Keep all follow-up visits. This information is not intended to replace advice given to you by your health care provider. Make sure you discuss any questions you have with your health care provider. Document Revised: 11/25/2020 Document Reviewed: 11/25/2020 Elsevier Patient Education  [place].

## 2022-04-20 ENCOUNTER — Telehealth: Payer: Self-pay | Admitting: Family Medicine

## 2022-04-20 ENCOUNTER — Encounter: Payer: Self-pay | Admitting: *Deleted

## 2022-04-20 ENCOUNTER — Inpatient Hospital Stay: Payer: Medicare Other

## 2022-04-20 NOTE — Telephone Encounter (Signed)
Patient stated that he had help with eliquis last year  Med has went up 155 now and is wanting to know if he can get help again?  I let him know I would send this to Tammy for more info  Call patient number for more question" [phone]

## 2022-04-20 NOTE — Telephone Encounter (Signed)
Spoke with patient. He has reached coverage gap so we should be able to apply for medication assistance program from [place] for Eliquis if he has spent 3% of income out of pocket on medications for 2023. With patient's permission requested report from [place] for total household out of pocket medication expense for 2023. [facility] will fax to our office.

## 2022-04-20 NOTE — Progress Notes (Signed)
Patient in chemotherapy education class with wife Bethena Roys.  Discussed side effects of Carboplatin, Etoposide, Tecentriq which include but are not limited to myelosuppression, decreased appetite, fatigue, fever, allergic or infusional reaction, mucositis, cardiac toxicity, cough, SOB, altered taste, nausea and vomiting, diarrhea, constipation, elevated LFTs myalgia and arthralgias, hair loss or thinning, rash, skin dryness, nail changes, peripheral neuropathy, discolored urine, delayed wound healing, mental changes (Chemo brain), increased risk of infections, weight loss. Reviewed office policy and procedures and how to reach office during hours and after hours.  Reviewed when to call the office with any concerns or problems.  Scientist, clinical (histocompatibility and immunogenetics) given.  Discussed portacath insertion and EMLA cream administration.  Antiemetic protocol and chemotherapy schedule reviewed. Patient verbalized understanding of chemotherapy indications and possible side effects.  Teachback done

## 2022-04-22 ENCOUNTER — Other Ambulatory Visit: Payer: Self-pay | Admitting: Family Medicine

## 2022-04-23 ENCOUNTER — Other Ambulatory Visit: Payer: Self-pay

## 2022-04-23 ENCOUNTER — Inpatient Hospital Stay: Payer: Medicare Other | Admitting: Hematology & Oncology

## 2022-04-23 ENCOUNTER — Encounter: Payer: Self-pay | Admitting: *Deleted

## 2022-04-23 ENCOUNTER — Inpatient Hospital Stay: Payer: Medicare Other

## 2022-04-23 ENCOUNTER — Encounter: Payer: Self-pay | Admitting: Hematology & Oncology

## 2022-04-23 VITALS — BP 100/62 | HR 82 | Temp 98.2°F | Resp 17 | Ht 72.0 in | Wt 176.0 lb

## 2022-04-23 DIAGNOSIS — M25551 Pain in right hip: Secondary | ICD-10-CM | POA: Diagnosis not present

## 2022-04-23 DIAGNOSIS — C3491 Malignant neoplasm of unspecified part of right bronchus or lung: Secondary | ICD-10-CM | POA: Diagnosis not present

## 2022-04-23 DIAGNOSIS — C778 Secondary and unspecified malignant neoplasm of lymph nodes of multiple regions: Secondary | ICD-10-CM

## 2022-04-23 DIAGNOSIS — Z79899 Other long term (current) drug therapy: Secondary | ICD-10-CM | POA: Diagnosis not present

## 2022-04-23 DIAGNOSIS — C7931 Secondary malignant neoplasm of brain: Secondary | ICD-10-CM | POA: Diagnosis not present

## 2022-04-23 DIAGNOSIS — Z5111 Encounter for antineoplastic chemotherapy: Secondary | ICD-10-CM | POA: Diagnosis not present

## 2022-04-23 DIAGNOSIS — I4891 Unspecified atrial fibrillation: Secondary | ICD-10-CM | POA: Diagnosis not present

## 2022-04-23 DIAGNOSIS — R630 Anorexia: Secondary | ICD-10-CM | POA: Diagnosis not present

## 2022-04-23 DIAGNOSIS — R0602 Shortness of breath: Secondary | ICD-10-CM | POA: Diagnosis not present

## 2022-04-23 DIAGNOSIS — C3490 Malignant neoplasm of unspecified part of unspecified bronchus or lung: Secondary | ICD-10-CM | POA: Diagnosis not present

## 2022-04-23 LAB — CBC WITH DIFFERENTIAL (CANCER CENTER ONLY)
Abs Immature Granulocytes: 0.13 10*3/uL — ABNORMAL HIGH (ref 0.00–0.07)
Basophils Absolute: 0.1 10*3/uL (ref 0.0–0.1)
Basophils Relative: 1 %
Eosinophils Absolute: 0.3 10*3/uL (ref 0.0–0.5)
Eosinophils Relative: 2 %
HCT: 35.8 % — ABNORMAL LOW (ref 39.0–52.0)
Hemoglobin: 11.8 g/dL — ABNORMAL LOW (ref 13.0–17.0)
Immature Granulocytes: 1 %
Lymphocytes Relative: 13 %
Lymphs Abs: 1.4 10*3/uL (ref 0.7–4.0)
MCH: 30.7 pg (ref 26.0–34.0)
MCHC: 33 g/dL (ref 30.0–36.0)
MCV: 93.2 fL (ref 80.0–100.0)
Monocytes Absolute: 0.8 10*3/uL (ref 0.1–1.0)
Monocytes Relative: 7 %
Neutro Abs: 8 10*3/uL — ABNORMAL HIGH (ref 1.7–7.7)
Neutrophils Relative %: 76 %
Platelet Count: 230 10*3/uL (ref 150–400)
RBC: 3.84 MIL/uL — ABNORMAL LOW (ref 4.22–5.81)
RDW: 14.7 % (ref 11.5–15.5)
WBC Count: 10.6 10*3/uL — ABNORMAL HIGH (ref 4.0–10.5)
nRBC: 0 % (ref 0.0–0.2)

## 2022-04-23 LAB — LACTATE DEHYDROGENASE: LDH: 195 U/L — ABNORMAL HIGH (ref 98–192)

## 2022-04-23 LAB — CMP (CANCER CENTER ONLY)
ALT: 28 U/L (ref 0–44)
AST: 24 U/L (ref 15–41)
Albumin: 3.5 g/dL (ref 3.5–5.0)
Alkaline Phosphatase: 52 U/L (ref 38–126)
Anion gap: 8 (ref 5–15)
BUN: 30 mg/dL — ABNORMAL HIGH (ref 8–23)
CO2: 24 mmol/L (ref 22–32)
Calcium: 9.3 mg/dL (ref 8.9–10.3)
Chloride: 102 mmol/L (ref 98–111)
Creatinine: 0.89 mg/dL (ref 0.61–1.24)
GFR, Estimated: >60 mL/min (ref >60)
Glucose, Bld: 158 mg/dL — ABNORMAL HIGH (ref 70–99)
Potassium: 4 mmol/L (ref 3.5–5.1)
Sodium: 134 mmol/L — ABNORMAL LOW (ref 135–145)
Total Bilirubin: 0.6 mg/dL (ref 0.3–1.2)
Total Protein: 6.5 g/dL (ref 6.5–8.1)

## 2022-04-23 LAB — TSH: TSH: 5.985 u[IU]/mL — ABNORMAL HIGH (ref 0.350–4.500)

## 2022-04-23 LAB — URIC ACID: Uric Acid, Serum: 3.9 mg/dL (ref 3.7–8.6)

## 2022-04-23 MED ORDER — SODIUM CHLORIDE 0.9 % IV SOLN: INTRAVENOUS | Status: DC

## 2022-04-23 MED ORDER — LIDOCAINE-PRILOCAINE 2.5-2.5 % EX CREA: 1.0000 | TOPICAL_CREAM | CUTANEOUS | 3 refills | Status: AC | PRN

## 2022-04-23 MED ORDER — SODIUM CHLORIDE 0.9 % IV SOLN
40.0000 mg | Freq: Once | INTRAVENOUS | Status: DC
  Administered 2022-04-23: 40 mg via INTRAVENOUS
  Filled 2022-04-23: qty 4

## 2022-04-23 MED ORDER — FLUCONAZOLE 100 MG PO TABS: 100.0000 mg | ORAL_TABLET | Freq: Every day | ORAL | 0 refills | Status: DC

## 2022-04-23 NOTE — Patient Instructions (Signed)
Dehydration, Adult Dehydration is a condition in which there is not enough water or other fluids in the body. This happens when a person loses more fluids than he or she takes in. Important organs, such as the kidneys, brain, and heart, cannot function without a proper amount of fluids. Any loss of fluids from the body can lead to dehydration. Dehydration can be mild, moderate, or severe. It should be treated right away to prevent it from becoming severe. What are the causes? Dehydration may be caused by: Conditions that cause loss of water or other fluids, such as diarrhea, vomiting, or sweating or urinating a lot. Not drinking enough fluids, especially when you are ill or doing activities that require a lot of energy. Other illnesses and conditions, such as fever or infection. Certain medicines, such as medicines that remove excess fluid from the body (diuretics). Lack of safe drinking water. Not being able to get enough water and food. What increases the risk? The following factors may make you more likely to develop this condition: Having a long-term (chronic) illness that has not been treated properly, such as diabetes, heart disease, or kidney disease. Being 65 years of age or older. Having a disability. Living in a place that is high in altitude, where thinner, drier air causes more fluid loss. Doing exercises that put stress on your body for a long time (endurance sports). What are the signs or symptoms? Symptoms of dehydration depend on how severe it is. Mild or moderate dehydration Thirst. Dry lips or dry mouth. Dizziness or light-headedness, especially when standing up from a seated position. Muscle cramps. Dark urine. Urine may be the color of tea. Less urine or tears produced than usual. Headache. Severe dehydration Changes in skin. Your skin may be cold and clammy, blotchy, or pale. Your skin also may not return to normal after being lightly pinched and released. Little or  no tears, urine, or sweat. Changes in vital signs, such as rapid breathing and low blood pressure. Your pulse may be weak or may be faster than 100 beats a minute when you are sitting still. Other changes, such as: Feeling very thirsty. Sunken eyes. Cold hands and feet. Confusion. Being very tired (lethargic) or having trouble waking from sleep. Short-term weight loss. Loss of consciousness. How is this diagnosed? This condition is diagnosed based on your symptoms and a physical exam. You may have blood and urine tests to help confirm the diagnosis. How is this treated? Treatment for this condition depends on how severe it is. Treatment should be started right away. Do not wait until dehydration becomes severe. Severe dehydration is an emergency and needs to be treated in a hospital. Mild or moderate dehydration can be treated at home. You may be asked to: Drink more fluids. Drink an oral rehydration solution (ORS). This drink helps restore proper amounts of fluids and salts and minerals in the blood (electrolytes). Severe dehydration can be treated: With IV fluids. By correcting abnormal levels of electrolytes. This is often done by giving electrolytes through a tube that is passed through your nose and into your stomach (nasogastric tube, or NG tube). By treating the underlying cause of dehydration. Follow these instructions at home: Oral rehydration solution If told by your health care provider, drink an ORS: Make an ORS by following instructions on the package. Start by drinking small amounts, about  cup (120 mL) every 5-10 minutes. Slowly increase how much you drink until you have taken the amount recommended by your health   care provider. Eating and drinking        Drink enough clear fluid to keep your urine pale yellow. If you were told to drink an ORS, finish the ORS first and then start slowly drinking other clear fluids. Drink fluids such as: Water. Do not drink only  water. Doing that can lead to hyponatremia, which is having too little salt (sodium) in the body. Water from ice chips you suck on. Fruit juice that you have added water to (diluted fruit juice). Low-calorie sports drinks. Eat foods that contain a healthy balance of electrolytes, such as bananas, oranges, potatoes, tomatoes, and spinach. Do not drink alcohol. Avoid the following: Drinks that contain a lot of sugar. These include high-calorie sports drinks, fruit juice that is not diluted, and soda. Caffeine. Foods that are greasy or contain a lot of fat or sugar. General instructions Take over-the-counter and prescription medicines only as told by your health care provider. Do not take sodium tablets. Doing that can lead to having too much sodium in the body (hypernatremia). Return to your normal activities as told by your health care provider. Ask your health care provider what activities are safe for you. Keep all follow-up visits as told by your health care provider. This is important. Contact a health care provider if: You have muscle cramps, pain, or discomfort, such as: Pain in your abdomen and the pain gets worse or stays in one area (localizes). Stiff neck. You have a rash. You are more irritable than usual. You are sleepier or have a harder time waking than usual. You feel weak or dizzy. You feel very thirsty. Get help right away if you have: Any symptoms of severe dehydration. Symptoms of vomiting, such as: You cannot eat or drink without vomiting. Vomiting gets worse or does not go away. Vomit includes blood or green matter (bile). Symptoms that get worse with treatment. A fever. A severe headache. Problems with urination or bowel movements, such as: Diarrhea that gets worse or does not go away. Blood in your stool (feces). This may cause stool to look black and tarry. Not urinating, or urinating only a small amount of very dark urine, within 6-8 hours. Trouble  breathing. These symptoms may represent a serious problem that is an emergency. Do not wait to see if the symptoms will go away. Get medical help right away. Call your local emergency services (911 in the [place] Do not drive yourself to the hospital. Summary Dehydration is a condition in which there is not enough water or other fluids in the body. This happens when a person loses more fluids than he or she takes in. Treatment for this condition depends on how severe it is. Treatment should be started right away. Do not wait until dehydration becomes severe. Drink enough clear fluid to keep your urine pale yellow. If you were told to drink an oral rehydration solution (ORS), finish the ORS first and then start slowly drinking other clear fluids. Take over-the-counter and prescription medicines only as told by your health care provider. Get help right away if you have any symptoms of severe dehydration. This information is not intended to replace advice given to you by your health care provider. Make sure you discuss any questions you have with your health care provider. Document Revised: 09/30/2021 Document Reviewed: 01/04/2019 Elsevier Patient Education  2023 [employer]  

## 2022-04-23 NOTE — Patient Instructions (Signed)

## 2022-04-23 NOTE — Progress Notes (Signed)
Hematology and Oncology Follow Up Visit  Harry Brown [id number] 26-Feb-1941 81 y.o. 04/23/2022   Principle Diagnosis:  Extensive stage small cell lung cancer-CNS metastasis  Current Therapy:   Cranial radiotherapy --completed on 04/13/2022 Carboplatin/VP-16/Tecentriq -- start cycle #1 on 05/03/2022     Interim History:  Harry Brown is back for follow-up.  He now has his Port-A-Cath in.  He will start chemotherapy with immunotherapy on 05/03/2022.  He seems little bit dehydrated.  He has little bit of sores in his mouth.  He does have little bit of thrush.  I am sure this is from the Decadron.  He is on a Decadron taper right now.  He does feel weak.  Again I think he is a little bit dehydrated.  We will give him some IV fluid today.  He has had no problems with diarrhea.  He has had no bleeding.  He has had no nausea or vomiting.  He has had no cough or increased shortness of breath.  He has had no leg swelling.  He has had no rashes.  I think he tolerated the cranial radiation pretty well.  Currently, I would say his performance status is probably ECOG 1-2.   Medications:  Current Outpatient Medications:    atorvastatin (LIPITOR) 40 MG tablet, Take 1 tablet (40 mg total) by mouth daily., Disp: 90 tablet, Rfl: 0   Cholecalciferol (VITAMIN D) 50 MCG (2000 UT) tablet, Take 2,000 Units by mouth daily. , Disp: , Rfl:    dexamethasone (DECADRON) 4 MG tablet, Take 1 tablet (4 mg total) by mouth daily., Disp: 30 tablet, Rfl: 0   dexamethasone (DECADRON) 4 MG tablet, Take 2 tabs by mouth starting day after last dose of etoposide for one day only. Repeat every 21 days. Take with food., Disp: 8 tablet, Rfl: 0   ELIQUIS 5 MG TABS tablet, TAKE ONE (1) TABLET BY MOUTH TWO (2) TIMES DAILY, Disp: 60 tablet, Rfl: 5   latanoprost (XALATAN) 0.005 % ophthalmic solution, Place 1 drop into both eyes at bedtime. , Disp: , Rfl:    levothyroxine (SYNTHROID) 75 MCG tablet, TAKE ONE (1) TABLET BY  MOUTH EVERY DAY, Disp: 90 tablet, Rfl: 0   loratadine (CLARITIN) 10 MG tablet, Take 10 mg by mouth daily as needed for allergies., Disp: , Rfl:    losartan (COZAAR) 25 MG tablet, Take 1 tablet (25 mg total) by mouth daily., Disp: 90 tablet, Rfl: 1   metFORMIN (GLUCOPHAGE-XR) 500 MG 24 hr tablet, TAKE TWO (2) TABLETS BY MOUTH EVERY MORNING WITH BREAKFAST, Disp: 180 tablet, Rfl: 1   ondansetron (ZOFRAN) 8 MG tablet, Take 1 tablet (8 mg total) by mouth every 8 (eight) hours as needed for nausea, vomiting or refractory nausea / vomiting. Start on the third day after carboplatin., Disp: 30 tablet, Rfl: 1   ONETOUCH DELICA LANCETS 78I MISC, USE TO CHECK BLOOD SUGAR ONCE DAILY, Disp: 100 each, Rfl: 1   ONETOUCH ULTRA test strip, USE 1 STRIP DAILY, Disp: 100 each, Rfl: 12   prochlorperazine (COMPAZINE) 10 MG tablet, Take 1 tablet (10 mg total) by mouth every 6 (six) hours as needed for nausea or vomiting., Disp: 30 tablet, Rfl: 1   tamsulosin (FLOMAX) 0.4 MG CAPS capsule, Take 1 capsule (0.4 mg total) by mouth daily., Disp: 90 capsule, Rfl: 1   vitamin B-12 (CYANOCOBALAMIN) 1000 MCG tablet, Take 1,000 mcg by mouth daily., Disp: , Rfl:    atenolol (TENORMIN) 25 MG tablet, TAKE ONE (1) TABLET  BY MOUTH EVERY DAY, Disp: 90 tablet, Rfl: 0   nitroGLYCERIN (NITROSTAT) 0.4 MG SL tablet, Place 1 tablet (0.4 mg total) under the tongue every 5 (five) minutes as needed for chest pain., Disp: 25 tablet, Rfl: 11  Allergies:  Allergies  Allergen Reactions   Adhesive [Tape] Rash   Latex Rash   Other Hives, Swelling and Rash    Plastic shopping bags Metal staples: rash, swelling    Past Medical History, Surgical history, Social history, and Family History were reviewed and updated.  Review of Systems: Review of Systems  Constitutional: Negative.   HENT:  Negative.    Eyes: Negative.   Respiratory: Negative.    Cardiovascular: Negative.   Gastrointestinal: Negative.   Endocrine: Negative.   Genitourinary:  Negative.    Musculoskeletal: Negative.   Skin: Negative.   Neurological: Negative.   Hematological: Negative.   Psychiatric/Behavioral: Negative.      Physical Exam:  vitals were not taken for this visit.   Wt Readings from Last 3 Encounters:  04/19/22 180 lb (81.6 kg)  04/07/22 182 lb (82.6 kg)  03/25/22 178 lb (80.7 kg)    Physical Exam Vitals reviewed.  HENT:     Head: Normocephalic and atraumatic.  Eyes:     Pupils: Pupils are equal, round, and reactive to light.  Cardiovascular:     Rate and Rhythm: Normal rate and regular rhythm.     Heart sounds: Normal heart sounds.  Pulmonary:     Effort: Pulmonary effort is normal.     Breath sounds: Normal breath sounds.  Abdominal:     General: Bowel sounds are normal.     Palpations: Abdomen is soft.  Musculoskeletal:        General: No tenderness or deformity. Normal range of motion.     Cervical back: Normal range of motion.  Lymphadenopathy:     Cervical: No cervical adenopathy.  Skin:    General: Skin is warm and dry.     Findings: No erythema or rash.  Neurological:     Mental Status: He is alert and oriented to person, place, and time.  Psychiatric:        Behavior: Behavior normal.        Thought Content: Thought content normal.        Judgment: Judgment normal.      Lab Results  Component Value Date   WBC 10.6 (H) 04/23/2022   HGB 11.8 (L) 04/23/2022   HCT 35.8 (L) 04/23/2022   MCV 93.2 04/23/2022   PLT 230 04/23/2022     Chemistry      Component Value Date/Time   NA 137 03/11/2022 1032   NA 139 02/04/2022 0921   K 4.6 03/11/2022 1032   CL 105 03/11/2022 1032   CO2 26 03/11/2022 1032   BUN 21 03/11/2022 1032   BUN 21 02/04/2022 0921   CREATININE 1.05 03/11/2022 1032   CREATININE 1.16 (H) 04/03/2020 1044      Component Value Date/Time   CALCIUM 9.6 03/11/2022 1032   ALKPHOS 81 03/11/2022 1032   AST 23 03/11/2022 1032   ALT 18 03/11/2022 1032   BILITOT 0.6 03/11/2022 1032       Impression and Plan: Harry Brown is a very nice 81 year old white male.  He has extensive stage small cell lung cancer.  However, the extensive stage is only with brain metastasis.  The bulk of his disease is still in his chest.  As such, I still think that he probably  would be a good candidate for systemic therapy along with radiation therapy to the chest.    He is due to start treatment on the November 27.  I think the IV fluids will help him out.  I will send in some Diflucan (100 mg p.o. daily x14 days) to try to help with his thrush.  I think were going to see him back on the 27th.  We will see how he is doing at that point in time.  Again I think we have a chance of having a prolonged remission since he does not have extensive disease outside of that with his brain.      Volanda Napoleon, MD 11/17/202311:18 AM

## 2022-04-23 NOTE — Progress Notes (Unsigned)
Patient here for follow up prior to starting treatment. He has some residual side effects from the radiation and decadron which will be treated today in infusion.   Oncology Nurse Navigator Documentation     04/23/2022   12:30 PM  Oncology Nurse Navigator Flowsheets  Phase of Treatment Chemo  Chemotherapy Pending- Reason: Surgeon or Oncologist Initiated  Navigator Follow Up Date: 05/03/2022  Navigator Follow Up Reason: Chemotherapy  Navigator Location [facility]  Navigator Encounter Type Follow-up Appt  Patient Visit Type MedOnc  Treatment Phase Active Tx  Barriers/Navigation Needs Coordination of Care;Education  Interventions Other  Acuity Level 2-Minimal Needs (1-2 Barriers Identified)  Support Groups/Services Friends and Family  Time Spent with Patient 15

## 2022-04-26 ENCOUNTER — Observation Stay (HOSPITAL_COMMUNITY)
Admission: EM | Admit: 2022-04-26 | Discharge: 2022-04-28 | Disposition: A | Discharge status: Home / Self Care | Payer: Medicare Other | Attending: Internal Medicine | Admitting: Internal Medicine

## 2022-04-26 ENCOUNTER — Other Ambulatory Visit: Payer: Self-pay

## 2022-04-26 ENCOUNTER — Emergency Department (HOSPITAL_COMMUNITY): Payer: Medicare Other

## 2022-04-26 ENCOUNTER — Encounter: Payer: Self-pay | Admitting: *Deleted

## 2022-04-26 ENCOUNTER — Encounter: Payer: Self-pay | Admitting: Hematology & Oncology

## 2022-04-26 ENCOUNTER — Encounter (HOSPITAL_COMMUNITY): Payer: Self-pay | Admitting: Internal Medicine

## 2022-04-26 DIAGNOSIS — Z7901 Long term (current) use of anticoagulants: Secondary | ICD-10-CM | POA: Insufficient documentation

## 2022-04-26 DIAGNOSIS — I2510 Atherosclerotic heart disease of native coronary artery without angina pectoris: Secondary | ICD-10-CM | POA: Diagnosis not present

## 2022-04-26 DIAGNOSIS — I4891 Unspecified atrial fibrillation: Secondary | ICD-10-CM | POA: Insufficient documentation

## 2022-04-26 DIAGNOSIS — Z7984 Long term (current) use of oral hypoglycemic drugs: Secondary | ICD-10-CM | POA: Diagnosis not present

## 2022-04-26 DIAGNOSIS — I619 Nontraumatic intracerebral hemorrhage, unspecified: Secondary | ICD-10-CM | POA: Diagnosis not present

## 2022-04-26 DIAGNOSIS — Z951 Presence of aortocoronary bypass graft: Secondary | ICD-10-CM | POA: Insufficient documentation

## 2022-04-26 DIAGNOSIS — C778 Secondary and unspecified malignant neoplasm of lymph nodes of multiple regions: Secondary | ICD-10-CM

## 2022-04-26 DIAGNOSIS — E034 Atrophy of thyroid (acquired): Secondary | ICD-10-CM

## 2022-04-26 DIAGNOSIS — R4182 Altered mental status, unspecified: Secondary | ICD-10-CM | POA: Diagnosis present

## 2022-04-26 DIAGNOSIS — I10 Essential (primary) hypertension: Secondary | ICD-10-CM | POA: Diagnosis not present

## 2022-04-26 DIAGNOSIS — I613 Nontraumatic intracerebral hemorrhage in brain stem: Secondary | ICD-10-CM

## 2022-04-26 DIAGNOSIS — Z79899 Other long term (current) drug therapy: Secondary | ICD-10-CM | POA: Diagnosis not present

## 2022-04-26 DIAGNOSIS — E119 Type 2 diabetes mellitus without complications: Secondary | ICD-10-CM | POA: Diagnosis not present

## 2022-04-26 DIAGNOSIS — R2689 Other abnormalities of gait and mobility: Secondary | ICD-10-CM | POA: Diagnosis not present

## 2022-04-26 DIAGNOSIS — C3491 Malignant neoplasm of unspecified part of right bronchus or lung: Secondary | ICD-10-CM

## 2022-04-26 DIAGNOSIS — I618 Other nontraumatic intracerebral hemorrhage: Secondary | ICD-10-CM | POA: Diagnosis not present

## 2022-04-26 DIAGNOSIS — Z87891 Personal history of nicotine dependence: Secondary | ICD-10-CM | POA: Diagnosis not present

## 2022-04-26 DIAGNOSIS — Z91040 Latex allergy status: Secondary | ICD-10-CM | POA: Insufficient documentation

## 2022-04-26 DIAGNOSIS — I612 Nontraumatic intracerebral hemorrhage in hemisphere, unspecified: Secondary | ICD-10-CM | POA: Diagnosis not present

## 2022-04-26 DIAGNOSIS — C7931 Secondary malignant neoplasm of brain: Secondary | ICD-10-CM | POA: Insufficient documentation

## 2022-04-26 DIAGNOSIS — E43 Unspecified severe protein-calorie malnutrition: Secondary | ICD-10-CM | POA: Insufficient documentation

## 2022-04-26 DIAGNOSIS — E039 Hypothyroidism, unspecified: Secondary | ICD-10-CM | POA: Diagnosis not present

## 2022-04-26 DIAGNOSIS — C3490 Malignant neoplasm of unspecified part of unspecified bronchus or lung: Secondary | ICD-10-CM | POA: Insufficient documentation

## 2022-04-26 LAB — URINALYSIS, ROUTINE W REFLEX MICROSCOPIC
Bacteria, UA: NONE SEEN
Bilirubin Urine: NEGATIVE
Glucose, UA: NEGATIVE mg/dL
Hgb urine dipstick: NEGATIVE
Ketones, ur: 20 mg/dL — AB
Leukocytes,Ua: NEGATIVE
Nitrite: NEGATIVE
Protein, ur: NEGATIVE mg/dL
Specific Gravity, Urine: 1.02 (ref 1.005–1.030)
pH: 5 (ref 5.0–8.0)

## 2022-04-26 LAB — APTT: aPTT: 44 seconds — ABNORMAL HIGH (ref 24–36)

## 2022-04-26 LAB — CBC
HCT: 37.7 % — ABNORMAL LOW (ref 39.0–52.0)
Hemoglobin: 12.6 g/dL — ABNORMAL LOW (ref 13.0–17.0)
MCH: 31.5 pg (ref 26.0–34.0)
MCHC: 33.4 g/dL (ref 30.0–36.0)
MCV: 94.3 fL (ref 80.0–100.0)
Platelets: 226 10*3/uL (ref 150–400)
RBC: 4 MIL/uL — ABNORMAL LOW (ref 4.22–5.81)
RDW: 14.7 % (ref 11.5–15.5)
WBC: 9.9 10*3/uL (ref 4.0–10.5)
nRBC: 0 % (ref 0.0–0.2)

## 2022-04-26 LAB — HEMOGLOBIN A1C
Hgb A1c MFr Bld: 6.5 % — ABNORMAL HIGH (ref 4.8–5.6)
Mean Plasma Glucose: 139.85 mg/dL

## 2022-04-26 LAB — CBG MONITORING, ED: Glucose-Capillary: 89 mg/dL (ref 70–99)

## 2022-04-26 LAB — TYPE AND SCREEN
ABO/RH(D): O NEG
Antibody Screen: NEGATIVE

## 2022-04-26 LAB — COMPREHENSIVE METABOLIC PANEL
ALT: 30 U/L (ref 0–44)
AST: 30 U/L (ref 15–41)
Albumin: 3.1 g/dL — ABNORMAL LOW (ref 3.5–5.0)
Alkaline Phosphatase: 52 U/L (ref 38–126)
Anion gap: 10 (ref 5–15)
BUN: 34 mg/dL — ABNORMAL HIGH (ref 8–23)
CO2: 23 mmol/L (ref 22–32)
Calcium: 8.7 mg/dL — ABNORMAL LOW (ref 8.9–10.3)
Chloride: 102 mmol/L (ref 98–111)
Creatinine, Ser: 0.97 mg/dL (ref 0.61–1.24)
GFR, Estimated: >60 mL/min (ref >60)
Glucose, Bld: 92 mg/dL (ref 70–99)
Potassium: 4.5 mmol/L (ref 3.5–5.1)
Sodium: 135 mmol/L (ref 135–145)
Total Bilirubin: 0.9 mg/dL (ref 0.3–1.2)
Total Protein: 6.5 g/dL (ref 6.5–8.1)

## 2022-04-26 LAB — GLUCOSE, CAPILLARY: Glucose-Capillary: 106 mg/dL — ABNORMAL HIGH (ref 70–99)

## 2022-04-26 LAB — PROTIME-INR
INR: 1.6 — ABNORMAL HIGH (ref 0.8–1.2)
Prothrombin Time: 19.1 seconds — ABNORMAL HIGH (ref 11.4–15.2)

## 2022-04-26 MED ORDER — ACETAMINOPHEN 325 MG PO TABS: 650.0000 mg | ORAL_TABLET | Freq: Four times a day (QID) | ORAL | Status: DC | PRN

## 2022-04-26 MED ORDER — ATORVASTATIN CALCIUM 40 MG PO TABS
40.0000 mg | ORAL_TABLET | Freq: Every day | ORAL | Status: DC
  Administered 2022-04-26 – 2022-04-28 (×3): 40 mg via ORAL
  Filled 2022-04-26 (×3): qty 1

## 2022-04-26 MED ORDER — INSULIN ASPART 100 UNIT/ML IJ SOLN
0.0000 [IU] | Freq: Three times a day (TID) | INTRAMUSCULAR | Status: DC
  Filled 2022-04-26: qty 0.15

## 2022-04-26 MED ORDER — ATENOLOL 25 MG PO TABS
25.0000 mg | ORAL_TABLET | Freq: Every day | ORAL | Status: DC
  Administered 2022-04-27 – 2022-04-28 (×2): 25 mg via ORAL
  Filled 2022-04-26 (×2): qty 1

## 2022-04-26 MED ORDER — INSULIN ASPART 100 UNIT/ML IJ SOLN
0.0000 [IU] | Freq: Every day | INTRAMUSCULAR | Status: DC
  Filled 2022-04-26: qty 0.05

## 2022-04-26 MED ORDER — EMPTY CONTAINERS FLEXIBLE MISC: 1800.0000 mg | Freq: Once | Status: DC

## 2022-04-26 MED ORDER — TAMSULOSIN HCL 0.4 MG PO CAPS
0.4000 mg | ORAL_CAPSULE | Freq: Every day | ORAL | Status: DC
  Administered 2022-04-26 – 2022-04-28 (×3): 0.4 mg via ORAL
  Filled 2022-04-26 (×3): qty 1

## 2022-04-26 MED ORDER — LEVOTHYROXINE SODIUM 75 MCG PO TABS
75.0000 ug | ORAL_TABLET | Freq: Every day | ORAL | Status: DC
  Administered 2022-04-27 – 2022-04-28 (×2): 75 ug via ORAL
  Filled 2022-04-26 (×2): qty 1

## 2022-04-26 MED ORDER — ACETAMINOPHEN 650 MG RE SUPP: 650.0000 mg | Freq: Four times a day (QID) | RECTAL | Status: DC | PRN

## 2022-04-26 MED ORDER — HYDROMORPHONE HCL 1 MG/ML IJ SOLN: 0.5000 mg | INTRAMUSCULAR | Status: DC | PRN

## 2022-04-26 MED ORDER — EMPTY CONTAINERS FLEXIBLE MISC
900.0000 mg | Freq: Once | Status: AC
  Administered 2022-04-26: 900 mg via INTRAVENOUS
  Filled 2022-04-26: qty 90

## 2022-04-26 NOTE — Progress Notes (Unsigned)
Pharmacist Chemotherapy Monitoring - Initial Assessment    Anticipated start date: 05/03/22   The following has been reviewed per standard work regarding the patient's treatment regimen: The patient's diagnosis, treatment plan and drug doses, and organ/hematologic function Lab orders and baseline tests specific to treatment regimen  The treatment plan start date, drug sequencing, and pre-medications Prior authorization status  Patient's documented medication list, including drug-drug interaction screen and prescriptions for anti-emetics and supportive care specific to the treatment regimen The drug concentrations, fluid compatibility, administration routes, and timing of the medications to be used The patient's access for treatment and lifetime cumulative dose history, if applicable  The patient's medication allergies and previous infusion related reactions, if applicable   Changes made to treatment plan:  N/A  Follow up needed:  N/A   Harry Brown, [facility], 04/26/2022  1:55 PM

## 2022-04-26 NOTE — ED Triage Notes (Addendum)
Pt arrived via POV. Pt had radiation tx for lung cx w/brain mets yesterday. Was c/o sore mouth, wife gave pt 2 doses of diflucan 0100/1200 today. Pt had some unintelligible speech. Speech is normal in triage rm  Hx of Afib takes Eliquis  Pt Aox4

## 2022-04-26 NOTE — ED Notes (Signed)
Report given to Amy at [facility]. [facility]

## 2022-04-26 NOTE — ED Provider Notes (Signed)
[place] DEPT Provider Note   CSN: [id number] Arrival date & time: 04/26/22  1258     History  Chief Complaint  Patient presents with   Altered Mental Status    Harry Brown. is a 81 y.o. male.  81 year old male with a past medical history of metastatic lung cancer with mets to the brain, recently completed radiation therapy and not yet started on chemotherapy, A-fib on Eliquis, hypertension, diabetes, CAD presenting to the emergency department with altered mental status.  Patient states that he has insomnia and was not sleeping well last night but otherwise had a normal morning.  His wife states that he sat in his chair to take a nap and when he woke up from his nap this afternoon he had slurred speech and was confused.  She states that he was walking around the house not knowing where he was supposed to be going or what he was supposed to be doing.  She states about the time that they arrived to the emergency department he seemed to be back to his normal self.  He denies any headaches, nausea, vomiting, chest pain or shortness of breath, diarrhea or constipation, fevers or chills, dysuria or hematuria.  Wife states that his only medication change was that he was started on Diflucan for thrush.  The history is provided by the patient and the spouse.  Altered Mental Status      Home Medications Prior to Admission medications   Medication Sig Start Date End Date Taking? Authorizing Provider  atenolol (TENORMIN) 25 MG tablet TAKE ONE (1) TABLET BY MOUTH EVERY DAY Patient taking differently: Take 25 mg by mouth daily. 04/23/22  Yes [name], MD  atorvastatin (LIPITOR) 40 MG tablet Take 1 tablet (40 mg total) by mouth daily. 01/25/22  Yes [name], MD  Cholecalciferol (VITAMIN D) 50 MCG (2000 UT) tablet Take 2,000 Units by mouth daily.    Yes [provider]  dexamethasone (DECADRON) 4 MG tablet Take 1 tablet (4 mg total) by  mouth daily. Patient taking differently: Take 2 mg by mouth every other day. 03/23/22  Yes Gery Pray, MD  ELIQUIS 5 MG TABS tablet TAKE ONE (1) TABLET BY MOUTH TWO (2) TIMES DAILY Patient taking differently: Take 5 mg by mouth 2 (two) times daily. 02/11/22  Yes [name], MD  latanoprost (XALATAN) 0.005 % ophthalmic solution Place 1 drop into both eyes at bedtime.  08/19/14  Yes [provider]  levothyroxine (SYNTHROID) 75 MCG tablet TAKE ONE (1) TABLET BY MOUTH EVERY DAY Patient taking differently: Take 75 mcg by mouth daily. 02/05/22  Yes [name], MD  lidocaine-prilocaine (EMLA) cream Apply 1 Application topically as needed. Patient taking differently: Apply 1 Application topically as needed (ears). 04/23/22  Yes Volanda Napoleon, MD  loratadine (CLARITIN) 10 MG tablet Take 10 mg by mouth daily as needed for allergies.   Yes [provider]  metFORMIN (GLUCOPHAGE-XR) 500 MG 24 hr tablet TAKE TWO (2) TABLETS BY MOUTH EVERY MORNING WITH BREAKFAST Patient taking differently: Take 1,000 mg by mouth in the morning. 10/22/21  Yes [name], MD  nitroGLYCERIN (NITROSTAT) 0.4 MG SL tablet Place 1 tablet (0.4 mg total) under the tongue every 5 (five) minutes as needed for chest pain. 08/07/18 04/26/22 Yes Revankar, Reita Cliche, MD  ondansetron (ZOFRAN) 8 MG tablet Take 1 tablet (8 mg total) by mouth every 8 (eight) hours as needed for nausea, vomiting or  refractory nausea / vomiting. Start on the third day after carboplatin. 04/16/22  Yes Volanda Napoleon, MD  prochlorperazine (COMPAZINE) 10 MG tablet Take 1 tablet (10 mg total) by mouth every 6 (six) hours as needed for nausea or vomiting. 04/16/22  Yes Volanda Napoleon, MD  tamsulosin (FLOMAX) 0.4 MG CAPS capsule Take 1 capsule (0.4 mg total) by mouth daily. 09/24/21  Yes [name], MD  vitamin B-12 (CYANOCOBALAMIN) 1000 MCG tablet Take 1,000 mcg by mouth daily.   Yes [provider]  dexamethasone (DECADRON)  4 MG tablet Take 2 tabs by mouth starting day after last dose of etoposide for one day only. Repeat every 21 days. Take with food. Patient not taking: Reported on 04/26/2022 04/16/22   Volanda Napoleon, MD  fluconazole (DIFLUCAN) 100 MG tablet Take 1 tablet (100 mg total) by mouth daily. Patient not taking: Reported on 04/26/2022 04/23/22   Volanda Napoleon, MD  losartan (COZAAR) 25 MG tablet Take 1 tablet (25 mg total) by mouth daily. Patient not taking: Reported on 04/26/2022 09/24/21   [name], MD  [facility] DELICA LANCETS 80D MISC USE TO CHECK BLOOD SUGAR ONCE DAILY 08/02/18   [name], MD  [facility] ULTRA test strip USE 1 STRIP DAILY 01/11/22   [name], MD      Allergies    Adhesive [tape], Latex, and Other    Review of Systems   Review of Systems  Physical Exam Updated Vital Signs BP (!) 107/55   Pulse 72   Temp 98 F (36.7 C) (Oral)   Resp 18   Ht 6' (1.829 m)   Wt 79.8 kg   SpO2 95%   BMI 23.86 kg/m  Physical Exam Vitals and nursing note reviewed.  Constitutional:      General: He is not in acute distress.    Appearance: Normal appearance.  HENT:     Head: Normocephalic and atraumatic.     Nose: Nose normal.     Mouth/Throat:     Mouth: Mucous membranes are moist.     Pharynx: Oropharynx is clear.  Eyes:     Extraocular Movements: Extraocular movements intact.     Conjunctiva/sclera: Conjunctivae normal.     Pupils: Pupils are equal, round, and reactive to light.  Cardiovascular:     Rate and Rhythm: Normal rate and regular rhythm.     Pulses: Normal pulses.     Heart sounds: Normal heart sounds.  Pulmonary:     Effort: Pulmonary effort is normal.     Breath sounds: Normal breath sounds.  Abdominal:     General: Abdomen is flat.     Palpations: Abdomen is soft.     Tenderness: There is no abdominal tenderness.  Musculoskeletal:        General: Normal range of motion.     Cervical back: Normal range of motion and neck supple.  Skin:     General: Skin is warm and dry.  Neurological:     General: No focal deficit present.     Mental Status: He is alert and oriented to person, place, and time.     Cranial Nerves: No cranial nerve deficit.     Sensory: No sensory deficit.     Motor: No weakness.     Coordination: Coordination normal.  Psychiatric:        Mood and Affect: Mood normal.        Behavior: Behavior normal.     ED Results /  Procedures / Treatments   Labs (all labs ordered are listed, but only abnormal results are displayed) Labs Reviewed  COMPREHENSIVE METABOLIC PANEL - Abnormal; Notable for the following components:      Result Value   BUN 34 (*)    Calcium 8.7 (*)    Albumin 3.1 (*)    All other components within normal limits  CBC - Abnormal; Notable for the following components:   RBC 4.00 (*)    Hemoglobin 12.6 (*)    HCT 37.7 (*)    All other components within normal limits  PROTIME-INR - Abnormal; Notable for the following components:   Prothrombin Time 19.1 (*)    INR 1.6 (*)    All other components within normal limits  URINALYSIS, ROUTINE W REFLEX MICROSCOPIC  APTT  CBG MONITORING, ED  TYPE AND SCREEN    EKG EKG Interpretation  Date/Time:  Monday April 26 2022 13:26:21 EST Ventricular Rate:  91 PR Interval:    QRS Duration: 109 QT Interval:  369 QTC Calculation: 732 R Axis:   24 Text Interpretation: Atrial fibrillation Borderline low voltage, extremity leads No significant change since last tracing Confirmed by Dorie Rank [phone]) on 04/26/2022 1:38:07 PM  Radiology CT HEAD WO CONTRAST (5MM)  Result Date: 04/26/2022 CLINICAL DATA:  Transient altered mental status, now resolved, metastatic lung cancer EXAM: CT HEAD WITHOUT CONTRAST TECHNIQUE: Contiguous axial images were obtained from the base of the skull through the vertex without intravenous contrast. RADIATION DOSE REDUCTION: This exam was performed according to the departmental dose-optimization program which includes  automated exposure control, adjustment of the mA and/or kV according to patient size and/or use of iterative reconstruction technique. COMPARISON:  MR brain, 03/15/2022 FINDINGS: Brain: Small focus of hemorrhage of the medial left parietal vertex, directly posterior to the medial central sulcus, measuring 1.5 x 1.4 x 1.2 cm (series 2, image 20, series 5, image 46). No evidence of acute infarction, hydrocephalus, extra-axial collection or directly visualized mass lesion/mass effect. Periventricular and deep white matter hypodensity. Vascular: No hyperdense vessel or unexpected calcification. Skull: Normal. Negative for fracture or focal lesion. Sinuses/Orbits: No acute finding. Other: None. IMPRESSION: 1. Small focus of hemorrhage of the medial left parietal vertex, directly posterior to the medial central sulcus, measuring 1.5 x 1.4 x 1.2 cm. This corresponds to a contrast enhancing metastasis demonstrated by recent prior MR. No evidence of mass effect or midline shift. 2. Small-vessel white matter disease. These results were called by telephone at the time of interpretation on 04/26/2022 at 3:14 pm to PA [facility] , who verbally acknowledged these results. Electronically Signed   By: Delanna Ahmadi M.D.   On: 04/26/2022 15:19    Procedures .Critical Care  Performed by: Kemper Durie, DO Authorized by: Kemper Durie, DO   Critical care provider statement:    Critical care time (minutes):  45   Critical care time was exclusive of:  Separately billable procedures and treating other patients   Critical care was necessary to treat or prevent imminent or life-threatening deterioration of the following conditions:  CNS failure or compromise   Critical care was time spent personally by me on the following activities:  Development of treatment plan with patient or surrogate, discussions with consultants, discussions with primary provider, examination of patient, obtaining history from patient or  surrogate, ordering and performing treatments and interventions, ordering and review of laboratory studies, ordering and review of radiographic studies, re-evaluation of patient's condition and review of old charts  I assumed direction of critical care for this patient from another provider in my specialty: no     Care discussed with: admitting provider       Medications Ordered in ED Medications  coag fact Xa recombinant (ANDEXXA) low dose infusion 900 mg (has no administration in time range)    ED Course/ Medical Decision Making/ A&P Clinical Course as of 04/26/22 1644  Mon Apr 26, 2022  1518 Per radiology: very small hemorrhage associated with a known metastasis.  [WF]  3009 Neurosurgery recommended Eliquis reversal but no surgical intervention right now. Recommended neurology evaluation for possible seizure and admission for further observation. [VK]  1615 I spoke with Dr. Leonel Ramsay of neurology who recommended admission for observation, agreed with Eliquis reversal, repeat CTH in the AM and EEG. [VK]    Clinical Course User Index [VK] Kemper Durie, DO [WF] Tedd Sias, [place]                           Medical Decision Making This patient presents to the ED with chief complaint(s) of altered mental status with pertinent past medical history of lung cancer with mets to the brain, A-fib on Eliquis, hypertension, diabetes, CAD which further complicates the presenting complaint. The complaint involves an extensive differential diagnosis and also carries with it a high risk of complications and morbidity.    The differential diagnosis includes ICH, mass effect, TIA, electrolyte abnormality, anemia, dehydration, infection, arrhythmia, syncope  Additional history obtained: Additional history obtained from spouse Records reviewed outpatient oncology records  ED Course and Reassessment: She was initially evaluated by provider in triage and had labs and head CT performed.  His  head CT was concerning for hemorrhage within his site of metastases.  He was immediately brought back to a room.  On his arrival he is awake and alert and hemodynamically stable with blood pressure well controlled.  He is at his neurologic baseline with a GCS of 15 and no focal deficits.  Neurosurgery and neurology will be consulted for recommendations and the patient will likely require admission for observation.    Independent labs interpretation:  The following labs were independently interpreted: Within normal range  Independent visualization of imaging: - I independently visualized the following imaging with scope of interpretation limited to determining acute life threatening conditions related to emergency care: CT head, which revealed small area of hemorrhage within that  Consultation: - Consulted or discussed management/test interpretation w/ external professional: Neurology, neurosurgery, pharmacy, hospitalist  Consideration for admission or further workup: Patient requires admission for further evaluation and management of his intracranial hemorrhage Social Determinants of health: N/A    Amount and/or Complexity of Data Reviewed Labs: ordered.  Risk Decision regarding hospitalization.          Final Clinical Impression(s) / ED Diagnoses Final diagnoses:  Nontraumatic hemorrhage of left cerebral hemisphere [facility])    Rx / DC Orders ED Discharge Orders     None         Kemper Durie, DO 04/26/22 1644

## 2022-04-26 NOTE — Consult Note (Signed)
Neurology Consultation    Reason for Consult:hemorrhage around known metastasis  CC: altered mental status   HISTORY OF PRESENT ILLNESS   HPI   Harry TUMOLO Sr. is a 81 y.o. male with a past medical history of CAD, HTN, DM2, afib on eliquis, hypothyroidism, and lung cancer with metastasis to the brain.  He completed radiation therapy on 11/19 and has not yet started chemotherapy. He has not been eating or sleeping well recently. He did not sleep well overnight and then woke up confused this morning. He had taken a nap and then woke up with unintelligible speech and repeating I don't know to certain questions. She then called the [facility] and Dr. Marin Olp recommended she bring him to the ED. CT head shows a small hemorrhage around a known metastatic tumor in the left parietal vertex. He has been compliant with his eliquis, neurosurgery was also consulted and recommended reversal of the eliquis with andexxa.   History is obtained from:Patient, wife, chart review  The patient denies headache, dizziness, visual changes, problems with swallowing or speech, focal muscle weakness, numbness or tingling of her extremities, abnormal movements, or other focal neurological deficits.  ROS: Full ROS was performed and is negative except as noted in the HPI.   PAST MEDICAL HISTORY    Past Medical History:  Past Medical History:  Diagnosis Date   Anemia 09/24/2013   CAD (coronary artery disease)    Cellulitis 05/14/2013   RLE   Diverticulitis    Dyspnea    03/15/22- due to fluid in lung.   ED (erectile dysfunction)    Glaucoma 08/18/2014   History of sleep apnea    had surgery to correct   Hypertension    Hypothyroidism    Medicare annual wellness visit, subsequent 04/13/2013   Myocardial infarction ([place]) 11/05/1985   Nocturia 03/15/2017   Other and unspecified hyperlipidemia    Penile lesion 02/24/2015   Peripheral neuropathy 03/31/2012   Peripheral vascular disease ([place])     Postoperative anemia due to acute blood loss 09/24/2013   Preventative health care 03/13/2016   Sleep apnea 09/16/2017   Small cell carcinoma of lung metastatic to lymph nodes of multiple sites ([place]) 03/26/2022   Small cell lung carcinoma, right ([place]) 03/26/2022   Type II diabetes mellitus (H[facility])    fasting 100-120   Urinary urgency 11/22/2012    No family history on file. Family History  Problem Relation Age of Onset   Hyperlipidemia Mother    Heart disease Mother    Diabetes Mother    Hypertension Mother    Heart disease Father    Asthma Father    Arthritis Father    Heart disease Sister    Heart disease Sister        s/p bypass x 2   Lupus Sister    Heart disease Brother        MI waiting on heart transplant when died   Heart disease Brother        massive MI   Cancer Neg Hx     Allergies:  Allergies  Allergen Reactions   Adhesive [Tape] Rash   Latex Rash   Other Hives, Swelling and Rash    Plastic shopping bags Metal staples: rash, swelling    Social History:   reports that he quit smoking about 17 years ago. His smoking use included cigarettes. He has a 75.00 pack-year smoking history. He has never used smokeless tobacco. He reports that he does not drink  alcohol and does not use drugs.    Medications (Not in a hospital admission)   EXAMINATION    Current vital signs:    04/26/2022    4:25 PM 04/26/2022    1:39 PM 04/26/2022    1:25 PM  Vitals with BMI  Height  6\' 0"    Weight  175 lbs 15 oz   BMI  95.28   Systolic 413  244  Diastolic 55  72  Pulse 72  100    Examination:  GENERAL: Awake, alert in NAD, Thin caucasian male HEENT: - Normocephalic and atraumatic, dry mm, no lymphadenopathy, no Thyromegally LUNGS - Clear to auscultation bilaterally CV - S1S2 RRR, equal pulses bilaterally. ABDOMEN - Soft, nontender, nondistended with normoactive BS Ext: warm, well perfused, intact peripheral pulses, no pedal edema  NEURO:  Mental Status: AA&Ox3   Language: speech is clear.  Intact naming, repetition, fluency, and comprehension. Unable to name president prior to [facility]. Cranial Nerves:  II: PERRL. Visual fields full to confrontation.  III, IV, VI: EOM in tact. Eyelids elevate symmetrically. Blinks to threat.  V: Sensation intact V1-3 symmetrically  VII: no facial asymmetry   VIII: hearing intact to voice IX, X: Palate elevates symmetrically. Phonation is normal.  WN:UUVOZDGU shrug 5/5 and symmetrical  XII: tongue is midline without fasciculations. Motor:  RUE:  5/5     LUE: 5/5 RLE: 5/5       LLE: 5/5  Tone: is normal, he appears frail DTRs: 2+ and symmetrical throughout   Sensation- Intact to light touch, pin prick, vibratory sensation, and temperature bilaterally Coordination: FTN intact bilaterally, no ataxia in BLE., no abnormal movements  Gait- deferred   LABS   I have reviewed labs in epic and the results pertinent to this consultation are:  Lab Results  Component Value Date   LDLCALC 76 12/16/2021   Lab Results  Component Value Date   ALT 30 04/26/2022   AST 30 04/26/2022   ALKPHOS 52 04/26/2022   BILITOT 0.9 04/26/2022   Lab Results  Component Value Date   HGBA1C 6.8 (H) 12/16/2021   Lab Results  Component Value Date   WBC 9.9 04/26/2022   HGB 12.6 (L) 04/26/2022   HCT 37.7 (L) 04/26/2022   MCV 94.3 04/26/2022   PLT 226 04/26/2022   Lab Results  Component Value Date   VITAMINB12 360 08/10/2021   Lab Results  Component Value Date   FOLATE >24.2 08/10/2021   Lab Results  Component Value Date   NA 135 04/26/2022   K 4.5 04/26/2022   CL 102 04/26/2022   CO2 23 04/26/2022     DIAGNOSTIC IMAGING/PROCEDURES   I have reviewed the images obtained:, as below    CT-head 1. Small focus of hemorrhage of the medial left parietal vertex, directly posterior to the medial central sulcus, measuring 1.5 x 1.4 x 1.2 cm. This corresponds to a contrast enhancing metastasis demonstrated by recent prior  MR. No evidence of mass effect or midline shift. 2. Small-vessel white matter disease. CTA head and neck   MRI brain- 03/17/2022 Approximately 5-6 minimally enhancing intracranial metastases. Largest lesions are located in the left parietal lobe and right external capsule. Mild edema without midline shift.   ASSESSMENT/PLAN    Assessment:  81 y.o. male with a past medical history of CAD, HTN, DM2, afib on eliquis, hypothyroidism, and lung cancer with metastasis to the brain. He completed radiation for lung cancer with mets to the brain on 04/25/2022 and he  has not yet started chemotherapy. He is compliant with his eliquis. He had a transient episode of confusion this morning. CT showed a small hemorrhage around a know metastasis likely due to radiation causing friability around his tumor and the eliquis subsequently increasing his risk for bleeding. Plan to transfer to [facility].   Impression:Altered mental status in the setting of hemorrhage around known metastatic tumor  Recommendations: - Repeat CT head ordered for tomorrow morning - Hold eliquis for at least a couple of weeks - EEG ordered  - Transfer to [facility] - Seizure precautions    -- Patient seen and examined by NP/APP with MD. MD to update note as needed.   Janine Ores, DNP, FNP-BC [facility] Pager: [phone]   **This documentation was dictated using [place] and may contain inadvertent errors **

## 2022-04-26 NOTE — ED Notes (Signed)
Error made in chart. Patient is not getting blood. Disregard consent in computer. JRPRN

## 2022-04-26 NOTE — Progress Notes (Signed)
Received a call from patient's wife, Charlett Nose. She states the patient has had a "rough weekend" and she's not sure what to do. His intake has been limited, but he has eaten and drank fluids "some". No vomiting. Last night he "moaned all night but when I asked him what I could do for him he just kept repeating 'I don't know'". This morning when he woke up he was confused and disoriented. She is asking him questions but he is unable to answer her. She denies any fevers.   Spoke with Dr Marin Olp. Due to patient's known brain mets, and new onset of confusion, he would like patient to be brought to the ED.   Charlett Nose understood these instructions and she plans to take the patient to the [facility] ED.   Oncology Nurse Navigator Documentation     04/26/2022   11:45 AM  Oncology Nurse Navigator Flowsheets  Navigator Follow Up Date: 05/03/2022  Navigator Follow Up Reason: Chemotherapy  Navigator Location [facility]  Navigator Encounter Type Telephone  Telephone Symptom Mgt;Incoming Call;Education  Patient Visit Type MedOnc  Treatment Phase Active Tx  Barriers/Navigation Needs Coordination of Care;Education  Education Pain/ Symptom Management;Other  Interventions Education;Psycho-Social Support  Acuity Level 2-Minimal Needs (1-2 Barriers Identified)  Education Method Verbal  Support Groups/Services Friends and Family  Time Spent with Patient 33

## 2022-04-26 NOTE — ED Provider Triage Note (Addendum)
Emergency Medicine Provider Triage Evaluation Note  Harry Angst Sr. , a 81 y.o. male  was evaluated in triage.  Pt complains of an episode of altered mental status. Seems that approximately 10:30 am Per wife pt states he was speaking with a garbled speech where (from the best description the wife can give) he was substituting abnormal words "something about [place]" and then walked down the hall and was confused. When she asked "are you tryin to go to the bathroom" he said "I don't know".  Symptoms for 1 hour.   Pt states he feels back to normal. States he feels a bit cold.   Hx of afib - on DOAC  Pt is a cancer pt w lung cancer --> mets to brain. Had radiation therapy to brain last done on Tuesday of last week.   Review of Systems  Positive: Transient AMS Negative: Fever   Physical Exam  BP 121/72 (BP Location: Left Arm)   Pulse 100   Temp 98 F (36.7 C) (Oral)   Resp 16   Ht 6' (1.829 m)   Wt 79.8 kg   SpO2 100%   BMI 23.86 kg/m  Gen:   Awake, no distress   Resp:  Normal effort  MSK:   Moves extremities without difficulty  Other:    Alert and oriented to self, place, time and event.   Speech is fluent, clear without dysarthria or dysphasia.   Strength 5/5 in upper/lower extremities   Sensation intact in upper/lower extremities   Normal gait.  CN I not tested  CN II grossly intact visual fields bilaterally. Did not visualize posterior eye.  CN III, IV, VI PERRLA and EOMs intact bilaterally  CN V Intact sensation to sharp and light touch to the face  CN VII facial movements symmetric  CN VIII not tested  CN IX, X no uvula deviation, symmetric rise of soft palate  CN XI 5/5 SCM and trapezius strength bilaterally  CN XII Midline tongue protrusion, symmetric L/R movements    Medical Decision Making  Medically screening exam initiated at 1:45 PM.  Appropriate orders placed.  Harry Angst Sr. was informed that the remainder of the evaluation will be  completed by another provider, this initial triage assessment does not replace that evaluation, and the importance of remaining in the ED until their evaluation is complete.  Labs, CT head, EKG, cbg    Harry Sias, PA 04/26/22 1351    Harry Brown, [place] 04/26/22 1353

## 2022-04-26 NOTE — H&P (Signed)
History and Physical    Patient: Harry Brown ZOX:[id number] DOB: 1941-02-09 DOA: 04/26/2022 DOS: the patient was seen and examined on 04/26/2022 PCP: [name], MD  Patient coming from: Home  Chief Complaint:  Chief Complaint  Patient presents with   Altered Mental Status   HPI: Harry Brown. is a 81 y.o. male with medical history significant of CAD, HTN, DM2, afib on eliquis, hypothyroid who presented to ED with acute mental status change. Pt was in usual state of health until the afternoon of presentation. Pt was asleep and upon awakening, found to be confused . Pt then brought to ED. While in ED, pt's condition noted to have improved.   In ED, pt found to have  1.5x1.4x1.2cm focus of hemorrhage involving medial L parietal vertex without mass effect of midline shift. INR noted to be 1.6. ED discussed with NS who recommended no surgical intervention, documentation reviewed. ED then discussed with Neurology and. Pt given dose of andexxa. Hospitalist consulted for consideration for admission Review of Systems: As mentioned in the history of present illness. All other systems reviewed and are negative. Past Medical History:  Diagnosis Date   Anemia 09/24/2013   CAD (coronary artery disease)    Cellulitis 05/14/2013   RLE   Diverticulitis    Dyspnea    03/15/22- due to fluid in lung.   ED (erectile dysfunction)    Glaucoma 08/18/2014   History of sleep apnea    had surgery to correct   Hypertension    Hypothyroidism    Medicare annual wellness visit, subsequent 04/13/2013   Myocardial infarction ([place]) 11/05/1985   Nocturia 03/15/2017   Other and unspecified hyperlipidemia    Penile lesion 02/24/2015   Peripheral neuropathy 03/31/2012   Peripheral vascular disease ([place])    Postoperative anemia due to acute blood loss 09/24/2013   Preventative health care 03/13/2016   Sleep apnea 09/16/2017   Small cell carcinoma of lung metastatic to lymph nodes of multiple  sites ([place]) 03/26/2022   Small cell lung carcinoma, right ([place]) 03/26/2022   Type II diabetes mellitus ([place])    fasting 100-120   Urinary urgency 11/22/2012   Past Surgical History:  Procedure Laterality Date   ABDOMINAL AORTAGRAM  Oct. 9, 2014   Dr. Bridgett Larsson   ABDOMINAL Maxcine Ham N/A 03/15/2013   Procedure: ABDOMINAL Maxcine Ham;  Surgeon: Conrad Chariton, MD;  [facility] CATH LAB;  Service: Cardiovascular;  Laterality: N/A;   ANTERIOR CERVICAL DECOMP/DISCECTOMY FUSION  ~ 2000   CARDIOVERSION N/A 12/03/2019   Procedure: CARDIOVERSION;  Surgeon: Acie Fredrickson, Wonda Cheng, MD;  Location: [facility] ENDOSCOPY;  Service: Cardiovascular;  Laterality: N/A;   CATARACT EXTRACTION Right    CORONARY ARTERY BYPASS GRAFT  2001   "CABG X3" (05/14/2013)   DENTAL SURGERY     "multiple teeth removed; trmimed top of mouth so dentures would fit" (05/14/2013)   EYE SURGERY     cataracts   FEMORAL-POPLITEAL BYPASS GRAFT Right 04/25/2013   Procedure: RIGHT FEMORAL TO ABOVE KNEE POPLITEAL BYPASS GRAFT WITH RIGHT [place]; ULTRASOUND GUIDED;  Surgeon: Conrad Tukwila, MD;  Location: [place];  Service: Vascular;  Laterality: Right;   FINE NEEDLE ASPIRATION  03/16/2022   Procedure: FINE NEEDLE ASPIRATION;  Surgeon: Candee Furbish, MD;  Location: [facility] ENDOSCOPY;  Service: Pulmonary;;   GUM SURGERY  [phone]   "had bone scraped; had periodontal disease" (05/14/2013)   IR IMAGING GUIDED PORT INSERTION  04/19/2022   PALATE / UVULA  BIOPSY / EXCISION  2003   Removed due to sleep apnea   TEE WITHOUT CARDIOVERSION N/A 06/13/2020   Procedure: TRANSESOPHAGEAL ECHOCARDIOGRAM (TEE);  Surgeon: Geralynn Rile, MD;  Location: [place];  Service: Cardiovascular;  Laterality: N/A;   THORACENTESIS N/A 03/16/2022   Procedure: Mathews Robinsons;  Surgeon: Candee Furbish, MD;  Location: [facility] ENDOSCOPY;  Service: Pulmonary;  Laterality: N/A;   TONSILLECTOMY     VIDEO BRONCHOSCOPY WITH ENDOBRONCHIAL ULTRASOUND N/A 03/16/2022    Procedure: VIDEO BRONCHOSCOPY WITH ENDOBRONCHIAL ULTRASOUND;  Surgeon: Candee Furbish, MD;  Location: [facility] ENDOSCOPY;  Service: Pulmonary;  Laterality: N/A;   Social History:  reports that he quit smoking about 17 years ago. His smoking use included cigarettes. He has a 75.00 pack-year smoking history. He has never used smokeless tobacco. He reports that he does not drink alcohol and does not use drugs.  Allergies  Allergen Reactions   Adhesive [Tape] Rash   Latex Rash   Other Hives, Swelling and Rash    Plastic shopping bags Metal staples: rash, swelling    Family History  Problem Relation Age of Onset   Hyperlipidemia Mother    Heart disease Mother    Diabetes Mother    Hypertension Mother    Heart disease Father    Asthma Father    Arthritis Father    Heart disease Sister    Heart disease Sister        s/p bypass x 2   Lupus Sister    Heart disease Brother        MI waiting on heart transplant when died   Heart disease Brother        massive MI   Cancer Neg Hx     Prior to Admission medications   Medication Sig Start Date End Date Taking? Authorizing Provider  atenolol (TENORMIN) 25 MG tablet TAKE ONE (1) TABLET BY MOUTH EVERY DAY 04/23/22  Yes [name], MD  atorvastatin (LIPITOR) 40 MG tablet Take 1 tablet (40 mg total) by mouth daily. 01/25/22  Yes [name], MD  Cholecalciferol (VITAMIN D) 50 MCG (2000 UT) tablet Take 2,000 Units by mouth daily.    Yes [provider]  dexamethasone (DECADRON) 4 MG tablet Take 1 tablet (4 mg total) by mouth daily. Patient taking differently: Take 2 mg by mouth every other day. 03/23/22  Yes Gery Pray, MD  dexamethasone (DECADRON) 4 MG tablet Take 2 tabs by mouth starting day after last dose of etoposide for one day only. Repeat every 21 days. Take with food. 04/16/22  Yes Ennever, Rudell Cobb, MD  ELIQUIS 5 MG TABS tablet TAKE ONE (1) TABLET BY MOUTH TWO (2) TIMES DAILY 02/11/22   [name], MD  fluconazole  (DIFLUCAN) 100 MG tablet Take 1 tablet (100 mg total) by mouth daily. 04/23/22   Volanda Napoleon, MD  latanoprost (XALATAN) 0.005 % ophthalmic solution Place 1 drop into both eyes at bedtime.  08/19/14   [provider]  levothyroxine (SYNTHROID) 75 MCG tablet TAKE ONE (1) TABLET BY MOUTH EVERY DAY 02/05/22   [name], MD  lidocaine-prilocaine (EMLA) cream Apply 1 Application topically as needed. 04/23/22   Volanda Napoleon, MD  loratadine (CLARITIN) 10 MG tablet Take 10 mg by mouth daily as needed for allergies.    [provider]  losartan (COZAAR) 25 MG tablet Take 1 tablet (25 mg total) by mouth daily. 09/24/21   [name], MD  metFORMIN ([place]) 500  MG 24 hr tablet TAKE TWO (2) TABLETS BY MOUTH EVERY MORNING WITH BREAKFAST 10/22/21   [name], MD  nitroGLYCERIN (NITROSTAT) 0.4 MG SL tablet Place 1 tablet (0.4 mg total) under the tongue every 5 (five) minutes as needed for chest pain. 08/07/18 03/11/22  Revankar, Reita Cliche, MD  ondansetron (ZOFRAN) 8 MG tablet Take 1 tablet (8 mg total) by mouth every 8 (eight) hours as needed for nausea, vomiting or refractory nausea / vomiting. Start on the third day after carboplatin. 04/16/22   Volanda Napoleon, MD  [facility] DELICA LANCETS 35T MISC USE TO CHECK BLOOD SUGAR ONCE DAILY 08/02/18   [name], MD  [facility] ULTRA test strip USE 1 STRIP DAILY 01/11/22   [name], MD  prochlorperazine (COMPAZINE) 10 MG tablet Take 1 tablet (10 mg total) by mouth every 6 (six) hours as needed for nausea or vomiting. 04/16/22   Volanda Napoleon, MD  tamsulosin (FLOMAX) 0.4 MG CAPS capsule Take 1 capsule (0.4 mg total) by mouth daily. 09/24/21   [name], MD  vitamin B-12 (CYANOCOBALAMIN) 1000 MCG tablet Take 1,000 mcg by mouth daily.    [provider]    Physical Exam: Vitals:   04/26/22 1313 04/26/22 1325 04/26/22 1339 04/26/22 1625  BP: 104/74 121/72  (!) 107/55  Pulse: 96 100  72  Resp: _0 Temp: 98.3 F (36.8 C) 98 F (36.7 C)    TempSrc: Oral Oral    SpO2: 97% 100% 100% 95%  Weight:   79.8 kg   Height:   6' (1.829 m)    General exam: Awake, laying in bed, in nad Respiratory system: Normal respiratory effort, no wheezing Cardiovascular system: regular rate, s1, s2 Gastrointestinal system: Soft, nondistended, positive BS Central nervous system: CN2-12 grossly intact, strength intact Extremities: Perfused, no clubbing Skin: Normal skin turgor, no notable skin lesions seen Psychiatry: Mood normal // no visual hallucinations   Data Reviewed:  Labs reviewed: Na 135, K 4.5, Cr 0.97, WBC 9.9, Hgb 12.6  Assessment and Plan: No notes have been filed under this hospital service. Service: Hospitalist Known brain met with hemorrhage -CT head reviewed. Small focus hemorrhage measuring 1.5 x 1.4 x 1.2 cm with no mass effect or midline shift -INR 1.4 -pt s/p dose of andexxa -Per EDP, no indication for  surgery per Neurosurgery -Neurology consulted, will f/u on recs  Small cell lung cancer -Followed by Dr. Marin Olp. Had received radiation tx and chemo -Will inform Oncology of pt's visit  DM2 -will conitnue on SSI as needed  Afb on eliquis -Currently rate controlled -Cont home meds for rate control once confirmed by pharmacy -Hold anticoagulation secondary to above concerns  HTN -BP stable at present -Would resume home meds once confirmed by pharmacy  CAD -asymptomatic at this time   Advance Care Planning:   Code Status: Prior Full  Consults: Neurology, Oncology  Family Communication: Pt in room  Severity of Illness: The appropriate patient status for this patient is OBSERVATION. Observation status is judged to be reasonable and necessary in order to provide the required intensity of service to ensure the patient's safety. The patient's presenting symptoms, physical exam findings, and initial radiographic and laboratory data in the context of their medical  condition is felt to place them at decreased risk for further clinical deterioration. Furthermore, it is anticipated that the patient will be medically stable for discharge from the hospital within 2 midnights of admission.  Author: Marylu Lund, MD 04/26/2022 4:28 PM  For on call review [email or web address].

## 2022-04-27 ENCOUNTER — Observation Stay (HOSPITAL_COMMUNITY): Payer: Medicare Other

## 2022-04-27 ENCOUNTER — Other Ambulatory Visit: Payer: Self-pay | Admitting: Radiation Therapy

## 2022-04-27 DIAGNOSIS — I612 Nontraumatic intracerebral hemorrhage in hemisphere, unspecified: Secondary | ICD-10-CM | POA: Diagnosis not present

## 2022-04-27 DIAGNOSIS — C3490 Malignant neoplasm of unspecified part of unspecified bronchus or lung: Secondary | ICD-10-CM

## 2022-04-27 DIAGNOSIS — I613 Nontraumatic intracerebral hemorrhage in brain stem: Secondary | ICD-10-CM | POA: Diagnosis not present

## 2022-04-27 DIAGNOSIS — C7931 Secondary malignant neoplasm of brain: Secondary | ICD-10-CM | POA: Diagnosis not present

## 2022-04-27 DIAGNOSIS — R4182 Altered mental status, unspecified: Secondary | ICD-10-CM

## 2022-04-27 DIAGNOSIS — G319 Degenerative disease of nervous system, unspecified: Secondary | ICD-10-CM | POA: Diagnosis not present

## 2022-04-27 DIAGNOSIS — G9389 Other specified disorders of brain: Secondary | ICD-10-CM | POA: Diagnosis not present

## 2022-04-27 LAB — COMPREHENSIVE METABOLIC PANEL
ALT: 25 U/L (ref 0–44)
AST: 27 U/L (ref 15–41)
Albumin: 2.7 g/dL — ABNORMAL LOW (ref 3.5–5.0)
Alkaline Phosphatase: 45 U/L (ref 38–126)
Anion gap: 11 (ref 5–15)
BUN: 23 mg/dL (ref 8–23)
CO2: 22 mmol/L (ref 22–32)
Calcium: 8.8 mg/dL — ABNORMAL LOW (ref 8.9–10.3)
Chloride: 101 mmol/L (ref 98–111)
Creatinine, Ser: 0.85 mg/dL (ref 0.61–1.24)
GFR, Estimated: >60 mL/min (ref >60)
Glucose, Bld: 102 mg/dL — ABNORMAL HIGH (ref 70–99)
Potassium: 4.1 mmol/L (ref 3.5–5.1)
Sodium: 134 mmol/L — ABNORMAL LOW (ref 135–145)
Total Bilirubin: 0.9 mg/dL (ref 0.3–1.2)
Total Protein: 5.8 g/dL — ABNORMAL LOW (ref 6.5–8.1)

## 2022-04-27 LAB — CBC
HCT: 34.1 % — ABNORMAL LOW (ref 39.0–52.0)
Hemoglobin: 11.5 g/dL — ABNORMAL LOW (ref 13.0–17.0)
MCH: 31.3 pg (ref 26.0–34.0)
MCHC: 33.7 g/dL (ref 30.0–36.0)
MCV: 92.7 fL (ref 80.0–100.0)
Platelets: 215 10*3/uL (ref 150–400)
RBC: 3.68 MIL/uL — ABNORMAL LOW (ref 4.22–5.81)
RDW: 14.6 % (ref 11.5–15.5)
WBC: 9 10*3/uL (ref 4.0–10.5)
nRBC: 0 % (ref 0.0–0.2)

## 2022-04-27 LAB — GLUCOSE, CAPILLARY
Glucose-Capillary: 98 mg/dL (ref 70–99)
Glucose-Capillary: 104 mg/dL — ABNORMAL HIGH (ref 70–99)
Glucose-Capillary: 103 mg/dL — ABNORMAL HIGH (ref 70–99)

## 2022-04-27 LAB — MRSA NEXT GEN BY PCR, NASAL: MRSA by PCR Next Gen: NOT DETECTED

## 2022-04-27 MED ORDER — SODIUM CHLORIDE 0.9% FLUSH
10.0000 mL | Freq: Two times a day (BID) | INTRAVENOUS | Status: DC
  Administered 2022-04-27: 10 mL
  Administered 2022-04-27: 20 mL
  Administered 2022-04-27: 10 mL

## 2022-04-27 MED ORDER — CHLORHEXIDINE GLUCONATE CLOTH 2 % EX PADS
6.0000 | MEDICATED_PAD | Freq: Every day | CUTANEOUS | Status: DC
  Administered 2022-04-28: 6 via TOPICAL

## 2022-04-27 MED ORDER — SODIUM CHLORIDE 0.9% FLUSH: 10.0000 mL | INTRAVENOUS | Status: DC | PRN

## 2022-04-27 MED ORDER — MAGIC MOUTHWASH W/LIDOCAINE
15.0000 mL | Freq: Four times a day (QID) | ORAL | Status: DC
  Administered 2022-04-27 (×4): 15 mL via ORAL
  Filled 2022-04-27 (×6): qty 15

## 2022-04-27 MED ORDER — LORAZEPAM 2 MG/ML IJ SOLN
2.0000 mg | Freq: Once | INTRAMUSCULAR | Status: DC
  Filled 2022-04-27: qty 1

## 2022-04-27 MED ORDER — MELATONIN 3 MG PO TABS
3.0000 mg | ORAL_TABLET | Freq: Every evening | ORAL | Status: DC | PRN
  Administered 2022-04-27: 3 mg via ORAL
  Filled 2022-04-27: qty 1

## 2022-04-27 MED ORDER — ENSURE MAX PROTEIN PO LIQD
11.0000 [oz_av] | Freq: Three times a day (TID) | ORAL | Status: DC
  Administered 2022-04-27: 11 [oz_av] via ORAL
  Filled 2022-04-27 (×4): qty 330

## 2022-04-27 MED ORDER — LORAZEPAM 2 MG/ML IJ SOLN
1.0000 mg | Freq: Once | INTRAMUSCULAR | Status: AC | PRN
  Administered 2022-04-27: 1 mg via INTRAVENOUS
  Filled 2022-04-27: qty 1

## 2022-04-27 NOTE — Care Management Obs Status (Signed)
[place] NOTIFICATION   Patient Details  Name: Harry VILLARRUEL Sr. MRN: [id number] Date of Birth: 1941-03-11   Medicare Observation Status Notification Given:  Yes    Pollie Friar, RN 04/27/2022, 2:40 PM

## 2022-04-27 NOTE — Evaluation (Signed)
Physical Therapy Evaluation Patient Details Name: Harry Brown Sr. MRN: [id number] DOB: [birth year] Today's Date: 04/27/2022  History of Present Illness  81 yo male presents to [facility] on 11/20 with AMS, slurred speech. CT showed hemorrhagic conversion of left parietal vertex metastasis. PMH includes metastatic lung cancer with mets to the brain, recently completed radiation therapy and not yet started on chemotherapy, A-fib on Eliquis, hypertension, diabetes, CAD, DMII with peripheral neuropathy.  Clinical Impression   Pt presents with generalized weakness but no focal deficits, impaired balance, and min attention and safety deficits. Pt to benefit from acute PT to address deficits. Pt ambulated hallway distance with use of SPC and close guard for safety, pt listing L in hallway and pt/family unable to state if this is baseline or not. PT anticipates pt is close to baseline, but would benefit from follow up PT. Pt and family decline need for PT follow up at this time, will continue to follow acutely.          Recommendations for follow up therapy are one component of a multi-disciplinary discharge planning process, led by the attending physician.  Recommendations may be updated based on patient status, additional functional criteria and insurance authorization.  Follow Up Recommendations Other (comment) (PT recommended follow up PT, pt and family request no PT follow up at this time)      Assistance Recommended at Discharge Intermittent Supervision/Assistance  Patient can return home with the following  A little help with walking and/or transfers    Equipment Recommendations None recommended by PT  Recommendations for Other Services       Functional Status Assessment Patient has had a recent decline in their functional status and demonstrates the ability to make significant improvements in function in a reasonable and predictable amount of time.     Precautions / Restrictions  Precautions Precautions: Fall Restrictions Weight Bearing Restrictions: No      Mobility  Bed Mobility Overal bed mobility: Needs Assistance Bed Mobility: Supine to Sit     Supine to sit: Supervision, HOB elevated          Transfers Overall transfer level: Needs assistance Equipment used: Straight cane Transfers: Sit to/from Stand Sit to Stand: Supervision           General transfer comment: slow to rise, use of UEs to power up    Ambulation/Gait Ambulation/Gait assistance: Min guard Gait Distance (Feet): 215 Feet Assistive device: Straight cane Gait Pattern/deviations: Step-through pattern, Decreased stride length, Staggering left Gait velocity: decr     General Gait Details: close guard for safety, cues for sequencing with use of SPC not followed, maintaining position in middle of hallway as pt frequently listing L  Stairs Stairs: Yes Stairs assistance: Min guard Stair Management: Two rails, Alternating pattern, Forwards Number of Stairs: 3 (x2)    Wheelchair Mobility    Modified Rankin (Stroke Patients Only) Modified Rankin (Stroke Patients Only) Pre-Morbid Rankin Score: Slight disability Modified Rankin: Moderate disability     Balance Overall balance assessment: Needs assistance Sitting-balance support: No upper extremity supported, Feet supported Sitting balance-Leahy Scale: Good     Standing balance support: During functional activity, Single extremity supported Standing balance-Leahy Scale: Fair                               Pertinent Vitals/Pain Pain Assessment Pain Assessment: No/denies pain    Home Living Family/patient expects to be discharged to:: Private residence Living  Arrangements: Spouse/significant other Available Help at Discharge: Family Type of Home: House Home Access: Stairs to enter Entrance Stairs-Rails: Right;Left;Can reach both Technical brewer of Steps: 5   Home Layout: One level Home  Equipment: Cane - single point      Prior Function Prior Level of Function : Independent/Modified Independent                     Hand Dominance   Dominant Hand: Right    Extremity/Trunk Assessment   Upper Extremity Assessment Upper Extremity Assessment: Defer to OT evaluation    Lower Extremity Assessment Lower Extremity Assessment: Generalized weakness (no focal neurologic deficit)    Cervical / Trunk Assessment Cervical / Trunk Assessment: Kyphotic  Communication   Communication: No difficulties  Cognition Arousal/Alertness: Awake/alert Behavior During Therapy: WFL for tasks assessed/performed Overall Cognitive Status: Impaired/Different from baseline Area of Impairment: Attention, Memory, Following commands, Problem solving                   Current Attention Level: Sustained Memory: Decreased short-term memory Following Commands: Follows one step commands with increased time     Problem Solving: Difficulty sequencing, Requires verbal cues, Requires tactile cues          General Comments      Exercises     Assessment/Plan    PT Assessment Patient needs continued PT services  PT Problem List Decreased mobility;Decreased activity tolerance;Decreased balance;Decreased knowledge of use of DME;Pain;Decreased safety awareness;Decreased cognition       PT Treatment Interventions DME instruction;Therapeutic activities;Gait training;Therapeutic exercise;Patient/family education;Balance training;Stair training;Functional mobility training;Neuromuscular re-education    PT Goals (Current goals can be found in the Care Plan section)  Acute Rehab PT Goals PT Goal Formulation: With patient Time For Goal Achievement: 05/11/22 Potential to Achieve Goals: Good    Frequency Min 4X/week     Co-evaluation               AM-PAC PT "6 Clicks" Mobility  Outcome Measure Help needed turning from your back to your side while in a flat bed without  using bedrails?: A Little Help needed moving from lying on your back to sitting on the side of a flat bed without using bedrails?: A Little Help needed moving to and from a bed to a chair (including a wheelchair)?: A Little Help needed standing up from a chair using your arms (e.g., wheelchair or bedside chair)?: A Little Help needed to walk in hospital room?: A Little Help needed climbing 3-5 steps with a railing? : A Little 6 Click Score: 18    End of Session Equipment Utilized During Treatment: Gait belt Activity Tolerance: Patient tolerated treatment well Patient left: in bed;with call bell/phone within reach;Other (comment);with family/visitor present (with OT at bedside) Nurse Communication: Mobility status PT Visit Diagnosis: Muscle weakness (generalized) (M62.81);Other abnormalities of gait and mobility (R26.89)    Time: 3762-8315 PT Time Calculation (min) (ACUTE ONLY): 19 min   Charges:   PT Evaluation $PT Eval Low Complexity: 1 Low          Calieb Lichtman S, PT DPT Acute Rehabilitation Services Pager [phone]  Office [phone]   Saman Giddens E Stroup 04/27/2022, 11:12 AM

## 2022-04-27 NOTE — Progress Notes (Signed)
EEG complete - results pending 

## 2022-04-27 NOTE — Plan of Care (Signed)

## 2022-04-27 NOTE — Progress Notes (Signed)
Initial Nutrition Assessment  DOCUMENTATION CODES:   Severe malnutrition in context of acute illness/injury  INTERVENTION:  - Add Ensure Max po BID, each supplement provides 150 kcal and 30 grams of protein.   - Add Magic cup TID with meals, each supplement provides 290 kcal and 9 grams of protein   - MVI q day.   - Consider appetite stimulant.   NUTRITION DIAGNOSIS:   Severe Malnutrition related to acute illness as evidenced by severe fat depletion, severe muscle depletion, percent weight loss (2.2% wt loss in 1 week).  GOAL:   Patient will meet greater than or equal to 90% of their needs   MONITOR:   PO intake, Supplement acceptance  REASON FOR ASSESSMENT:   Malnutrition Screening Tool    ASSESSMENT:   81 y.o. male admits related to AMS. PMH includes: CAD, HTN, DM2, afib, hypothyroid. Pt is currently receiving medical management for ICH.  Meds include: sliding scale insulin. Labs reviewed: Na low.   Pt was sleeping at time of assessment. Pt's daughter present in room at time of assessment. She reports that the pt only had a couple of bites of his breakfast tray this am. She states that he has had a poor appetite for quite a while. She reports that he has lost 25 lbs in the past month. Per record, pt has experienced a 2.2% wt loss in the last week, which is significant for time frame. The daughter reports that the pt likes Ensure Max, RD will add supplement TID along with magic cup. Will also inquire with MD about an appetite stimulant.   NUTRITION - FOCUSED PHYSICAL EXAM:  Flowsheet Row Most Recent Value  Orbital Region Severe depletion  Upper Arm Region Unable to assess  Thoracic and Lumbar Region Unable to assess  Buccal Region Severe depletion  Temple Region Severe depletion  Clavicle Bone Region Moderate depletion  Clavicle and Acromion Bone Region Unable to assess  Dorsal Hand Severe depletion  Patellar Region Unable to assess  Anterior Thigh Region Unable  to assess  Posterior Calf Region Unable to assess  Edema (RD Assessment) None  Hair Reviewed  Eyes Reviewed  Mouth Reviewed  Skin Reviewed  Nails Reviewed       Diet Order:   Diet Order             Diet Carb Modified Fluid consistency: Thin; Room service appropriate? Yes  Diet effective now                   EDUCATION NEEDS:   Not appropriate for education at this time  Skin:  Skin Assessment: Reviewed RN Assessment  Last BM:  PTA  Height:   Ht Readings from Last 1 Encounters:  04/26/22 6' (1.829 m)    Weight:   Wt Readings from Last 1 Encounters:  04/26/22 79.8 kg    Ideal Body Weight:  80.9 kg  BMI:  Body mass index is 23.86 kg/m.  Estimated Nutritional Needs:   Kcal:  4235-3614 kcals  Protein:  120-140 gm  Fluid:  >/= 2.3 L  Thalia Bloodgood, RD, LDN, CNSC.

## 2022-04-27 NOTE — TOC Transition Note (Signed)
Transition of Care [facility]) - CM/SW Discharge Note   Patient Details  Name: Harry PEMBROKE Sr. MRN: [id number] Date of Birth: 1940/07/05  Transition of Care [facility]) CM/SW Contact:  Pollie Friar, RN Phone Number: 04/27/2022, 2:53 PM   Clinical Narrative:    Pt is from home with his spouse. He has a cane he uses when needed. Pt's spouse provides needed transportation and over sees his medications. PT had recommended [facility] but wife doesn't feel he needs it currently.  Pt has transportation home when discharged.    Final next level of care: Home/Self Care Barriers to Discharge: No Barriers Identified   Patient Goals and CMS Choice        Discharge Placement                       Discharge Plan and Services                                     Social Determinants of Health (SDOH) Interventions     Readmission Risk Interventions     No data to display

## 2022-04-27 NOTE — Evaluation (Signed)
Occupational Therapy Evaluation Patient Details Name: Harry Brown Sr. MRN: [id number] DOB: [birth year] Today's Date: 04/27/2022   History of Present Illness 81 yo male presents to [facility] on 11/20 with AMS, slurred speech. CT showed hemorrhagic conversion of left parietal vertex metastasis. PMH includes metastatic lung cancer with mets to the brain, recently completed radiation therapy and not yet started on chemotherapy, A-fib on Eliquis, hypertension, diabetes, CAD, DMII with peripheral neuropathy.   Clinical Impression   Prior to this admission, patient ambulating with a cane at home, and able to complete ADLs at supervision level from wife. Patient currently with generalized weakness, and need for min A for ADLs. Wife is able to provide assist level at home, and has the appropriate equipment needed for safe discharge back into home environment. OT not recommending HHOT or outpatient services per wife's request (patient is usually not interested) and will continue to follow acutely through this hospital stay.      Recommendations for follow up therapy are one component of a multi-disciplinary discharge planning process, led by the attending physician.  Recommendations may be updated based on patient status, additional functional criteria and insurance authorization.   Follow Up Recommendations  No OT follow up     Assistance Recommended at Discharge Frequent or constant Supervision/Assistance  Patient can return home with the following A little help with walking and/or transfers;A little help with bathing/dressing/bathroom;Assistance with cooking/housework;Direct supervision/assist for medications management;Direct supervision/assist for financial management;Assist for transportation;Help with stairs or ramp for entrance    Functional Status Assessment  Patient has had a recent decline in their functional status and demonstrates the ability to make significant improvements in function in a  reasonable and predictable amount of time.  Equipment Recommendations  None recommended by OT (Patient has DME needed)    Recommendations for Other Services       Precautions / Restrictions Precautions Precautions: Fall Restrictions Weight Bearing Restrictions: No      Mobility Bed Mobility Overal bed mobility: Needs Assistance Bed Mobility: Supine to Sit     Supine to sit: Supervision, HOB elevated          Transfers Overall transfer level: Needs assistance Equipment used: Straight cane Transfers: Sit to/from Stand Sit to Stand: Supervision           General transfer comment: slow to rise, use of UEs to power up      Balance Overall balance assessment: Needs assistance Sitting-balance support: No upper extremity supported, Feet supported Sitting balance-Leahy Scale: Good     Standing balance support: During functional activity, Single extremity supported Standing balance-Leahy Scale: Fair                             ADL either performed or assessed with clinical judgement   ADL Overall ADL's : Needs assistance/impaired Eating/Feeding: Set up;Sitting   Grooming: Wash/dry hands;Wash/dry face;Oral care;Set up;Standing   Upper Body Bathing: Min guard;Standing;Sitting   Lower Body Bathing: Minimal assistance;Sit to/from stand;Sitting/lateral leans   Upper Body Dressing : Min guard;Sitting   Lower Body Dressing: Minimal assistance;Sitting/lateral leans;Sit to/from stand Lower Body Dressing Details (indicate cue type and reason): to don and doff pajama pants Toilet Transfer: Min guard;Ambulation Toilet Transfer Details (indicate cue type and reason): ambulating with cane Toileting- Clothing Manipulation and Hygiene: Min guard;Sit to/from stand;Sitting/lateral lean       Functional mobility during ADLs: Minimal assistance;Caregiver able to provide necessary level of assistance;Cueing for safety;Cueing for sequencing;[employer]  General ADL  Comments: Patient presenting with minimal unsteadiness in standing, but able to complete ADLs with min A from wife.     Vision Baseline Vision/History: 1 Wears glasses Ability to See in Adequate Light: 0 Adequate Patient Visual Report: No change from baseline       Perception     Praxis      Pertinent Vitals/Pain Pain Assessment Pain Assessment: No/denies pain     Hand Dominance Right   Extremity/Trunk Assessment Upper Extremity Assessment Upper Extremity Assessment: Generalized weakness   Lower Extremity Assessment Lower Extremity Assessment: Defer to PT evaluation   Cervical / Trunk Assessment Cervical / Trunk Assessment: Kyphotic   Communication Communication Communication: No difficulties   Cognition Arousal/Alertness: Awake/alert Behavior During Therapy: WFL for tasks assessed/performed Overall Cognitive Status: Impaired/Different from baseline Area of Impairment: Attention, Memory, Following commands, Problem solving                   Current Attention Level: Sustained Memory: Decreased short-term memory Following Commands: Follows one step commands with increased time     Problem Solving: Difficulty sequencing, Requires verbal cues, Requires tactile cues General Comments: patient alert and participatory, requires cues to maintain attention to task but easily redirected     General Comments       Exercises     Shoulder Instructions      Home Living Family/patient expects to be discharged to:: Private residence Living Arrangements: Spouse/significant other Available Help at Discharge: Family Type of Home: House Home Access: Stairs to enter Technical brewer of Steps: 5 Entrance Stairs-Rails: Right;Left;Can reach both Home Layout: One level     Bathroom Shower/Tub: Teacher, early years/pre: Standard     Home Equipment: Cane - single point;Tub bench          Prior Functioning/Environment Prior Level of Function :  Independent/Modified Independent             Mobility Comments: would ambulate with cane around the house ADLs Comments: could complete independently with extra time, wife would be close by to assist if needed in the shower        OT Problem List: Decreased strength;Decreased activity tolerance;Impaired balance (sitting and/or standing);Decreased coordination;Decreased cognition;Decreased safety awareness;Decreased knowledge of use of DME or AE;Decreased knowledge of precautions      OT Treatment/Interventions: Self-care/ADL training;Therapeutic exercise;Energy conservation;DME and/or AE instruction;Manual therapy;Therapeutic activities;Cognitive remediation/compensation;Patient/family education;Balance training    OT Goals(Current goals can be found in the care plan section) Acute Rehab OT Goals Patient Stated Goal: to go home OT Goal Formulation: With patient/family Time For Goal Achievement: 05/11/22 Potential to Achieve Goals: Good  OT Frequency: Min 2X/week    Co-evaluation              AM-PAC OT "6 Clicks" Daily Activity     Outcome Measure Help from another person eating meals?: None Help from another person taking care of personal grooming?: None Help from another person toileting, which includes using toliet, bedpan, or urinal?: A Little Help from another person bathing (including washing, rinsing, drying)?: A Little Help from another person to put on and taking off regular upper body clothing?: A Little Help from another person to put on and taking off regular lower body clothing?: A Little 6 Click Score: 20   End of Session Equipment Utilized During Treatment: Gait belt Kasandra Knudsen) Nurse Communication: Mobility status  Activity Tolerance: Patient tolerated treatment well Patient left: in bed;with call bell/phone within reach;with family/visitor present  OT Visit  Diagnosis: Unsteadiness on feet (R26.81);Other abnormalities of gait and mobility (R26.89);Other  symptoms and signs involving cognitive function                Time: 0071-2197 OT Time Calculation (min): 15 min Charges:  OT General Charges $OT Visit: 1 Visit OT Evaluation $OT Eval Moderate Complexity: 1 Mod  Corinne Ports E. Wilhelmine Krogstad, OTR/L Acute Rehabilitation Services [phone]   Ascencion Dike 04/27/2022, 11:32 AM

## 2022-04-27 NOTE — Progress Notes (Signed)
I attempted the mri on this patient, I did get a few images before patient started throwing himself off the table and attempting to rip off the mri head coil. The patient was given ativan prior to coming to the [facility] department, I did speak with the RN about given additional meds and waited for the RN to come down for an hour before deciding to send him back with the RN. The RN did come down and did not want to give the patient anymore meds. I cannot safely scan him in the current state he is in due to altered state he is in with the amount of meds he was given prior to the scan. I told the RN we can make another plan more or alternate meds that will safely sedate patient since the amount given was not enough. Feel free to call me if any additional questions.

## 2022-04-27 NOTE — Procedures (Signed)
Patient Name: Harry CASASOLA Sr.  MRN: [id number]  Epilepsy Attending: Lora Havens  Referring Physician/Provider: Janine Ores, NP  Date: 04/27/2022 Duration: 22.51 mins  Patient history: 57M with lung ca and a single brain met who underwent radiation treatment last week and not slept well over the weekend and woke up confused on Monday morning with unintelligible speech. EEG to evaluate for seizure.  Level of alertness: Awake  AEDs during EEG study: None  Technical aspects: This EEG study was done with scalp electrodes positioned according to the 10-20 International system of electrode placement. Electrical activity was reviewed with band pass filter of 1-_0 , sensitivity of 7 uV/mm, display speed of 12m/sec with a _1  notched filter applied as appropriate. EEG data were recorded continuously and digitally stored.  Video monitoring was available and reviewed as appropriate.  Description: The posterior dominant rhythm consists of 8 Hz activity of moderate voltage (25-35 uV) seen predominantly in posterior head regions, symmetric and reactive to eye opening and eye closing. EEG showed continuous generalized 3 to 6 Hz theta-delta slowing. Hyperventilation and photic stimulation were not performed.     ABNORMALITY - Continuous slow, generalized  IMPRESSION: This study is suggestive of moderate diffuse encephalopathy, nonspecific etiology. No seizures or epileptiform discharges were seen throughout the recording.  Harry Brown

## 2022-04-27 NOTE — Discharge Summary (Signed)
Physician Discharge Summary  Harry Brown SWF:[id number] DOB: 1941/02/09 DOA: 04/26/2022  PCP: [name], MD  Admit date: 04/26/2022 Discharge date: 04/27/2022  Admitted From: Home Disposition: Home  Recommendations for Outpatient Follow-up:  Follow up with PCP in 1-2 weeks Follow-up with oncology and neurology as scheduled  Home Health: None Equipment/Devices: None  Discharge Condition: Stable CODE STATUS: Full Diet recommendation: As tolerated regular diet  Brief/Interim Summary: Harry Angst Sr. is a 81 y.o. male with medical history significant of advanced stage small cell lung cancer - recently completing radiation to the brain, CAD, HTN, DM2, afib on eliquis, hypothyroid who presented to ED with acute mental status change. Pt was in usual state of health until the afternoon of presentation. Pt was asleep and upon awakening, found to be confused . Pt then brought to ED. Imaging revealed a hemorrhagic area around a new metastatic lesion to the brain. Hospitalist called for admission, neuro consulted.  Patient admitted as above after acute metabolic encephalopathy, imaging remarkable for a small hemorrhagic lesion located near known metastatic disease.  Patient had recently completed radiation to this area while on Eliquis.  Patient has been successfully reversed on Eliquis, repeat imaging remained stable.  Patient otherwise back to baseline agreeable for discharge home.  Close follow-up outpatient with PCP, oncology, radiation oncology as well as neurology as scheduled.  Patient had plan to initiate chemotherapy treatment on 05/03/2022 with Dr. Marin Olp.  Will defer to their expertise on timing given the above.  Patient discharged on medications outlined as below, discontinue Eliquis/losartan.  Discharge Diagnoses:  Principal Problem:   ICH (intracerebral hemorrhage) ([place])  Hemorrhagic lesion of the brain, localized to known metastatic lesion -CT head reviewed.  Small focus hemorrhage measuring 1.5 x 1.4 x 1.2 cm with no mass effect or midline shift -On eliquis for afib - reversed with andexxa -No indication for Neurosurgery involvement - no longer symptomatic and no midline shift -Neurology consulted, no further recommendations given resolution of symptoms -Recently completed radiation 04/13/22; plan to start carboplatin/VP-16/Tecentriq per ocnology on 11/27   Small cell lung cancer, metastatic -Follow-up with Dr. Marin Olp as scheduled   DM2 -Resume home medications   Afb previously on eliquis -Currently rate controlled -Discontinuing coagulation given above   HTN -BP stable at present -Would resume home meds once confirmed by pharmacy   CAD -asymptomatic at this time  Discharge Instructions   Allergies as of 04/27/2022       Reactions   Adhesive [tape] Rash   Latex Rash   Other Hives, Swelling, Rash   Plastic shopping bags Metal staples: rash, swelling        Medication List     STOP taking these medications    Eliquis 5 MG Tabs tablet Generic drug: apixaban   fluconazole 100 MG tablet Commonly known as: DIFLUCAN   losartan 25 MG tablet Commonly known as: COZAAR       TAKE these medications    atenolol 25 MG tablet Commonly known as: TENORMIN TAKE ONE (1) TABLET BY MOUTH EVERY DAY What changed: how much to take   atorvastatin 40 MG tablet Commonly known as: LIPITOR Take 1 tablet (40 mg total) by mouth daily.   cyanocobalamin 1000 MCG tablet Commonly known as: VITAMIN B12 Take 1,000 mcg by mouth daily.   dexamethasone 4 MG tablet Commonly known as: DECADRON Take 1 tablet (4 mg total) by mouth daily. What changed:  how much to take when to take this   dexamethasone 4  MG tablet Commonly known as: DECADRON Take 2 tabs by mouth starting day after last dose of etoposide for one day only. Repeat every 21 days. Take with food. What changed: Another medication with the same name was changed. Make sure  you understand how and when to take each.   latanoprost 0.005 % ophthalmic solution Commonly known as: XALATAN Place 1 drop into both eyes at bedtime.   levothyroxine 75 MCG tablet Commonly known as: SYNTHROID TAKE ONE (1) TABLET BY MOUTH EVERY DAY What changed: how much to take   lidocaine-prilocaine cream Commonly known as: EMLA Apply 1 Application topically as needed. What changed: reasons to take this   loratadine 10 MG tablet Commonly known as: CLARITIN Take 10 mg by mouth daily as needed for allergies.   metFORMIN 500 MG 24 hr tablet Commonly known as: GLUCOPHAGE-XR TAKE TWO (2) TABLETS BY MOUTH EVERY MORNING WITH BREAKFAST What changed:  how much to take how to take this when to take this additional instructions   nitroGLYCERIN 0.4 MG SL tablet Commonly known as: NITROSTAT Place 1 tablet (0.4 mg total) under the tongue every 5 (five) minutes as needed for chest pain.   ondansetron 8 MG tablet Commonly known as: Zofran Take 1 tablet (8 mg total) by mouth every 8 (eight) hours as needed for nausea, vomiting or refractory nausea / vomiting. Start on the third day after carboplatin.   OneTouch Delica Lancets 30Q Misc USE TO CHECK BLOOD SUGAR ONCE DAILY   OneTouch Ultra test strip Generic drug: glucose blood USE 1 STRIP DAILY   prochlorperazine 10 MG tablet Commonly known as: COMPAZINE Take 1 tablet (10 mg total) by mouth every 6 (six) hours as needed for nausea or vomiting.   tamsulosin 0.4 MG Caps capsule Commonly known as: FLOMAX Take 1 capsule (0.4 mg total) by mouth daily.   Vitamin D 50 MCG (2000 UT) tablet Take 2,000 Units by mouth daily.        Allergies  Allergen Reactions   Adhesive [Tape] Rash   Latex Rash   Other Hives, Swelling and Rash    Plastic shopping bags Metal staples: rash, swelling    Consultations: Neurology, oncology  Procedures/Studies: MR BRAIN WO CONTRAST  Result Date: 04/27/2022 CLINICAL DATA:  Metastatic disease  to brain. EXAM: MRI HEAD WITHOUT CONTRAST TECHNIQUE: Multiplanar, multiecho pulse sequences of the brain and surrounding structures were obtained without intravenous contrast. COMPARISON:  MRI head   03/15/2022 FINDINGS: Brain: Incomplete study.  Patient terminated the study prematurely. Hemorrhagic mass in the left medial parietal lobe is smaller. Improvement in surrounding edema. Hemorrhagic lesion in the right subinsular white matter smaller compared to the prior study. This lesion shows hemorrhage on the prior study which likely accounts for the area of diffusion hyperintensity. Additional enhancing lesions were present on the prior MRI but not visualized today. Generalized atrophy. Chronic ischemic change in the white matter. 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 Negative for acute infarct. Vascular: Normal arterial flow voids are Skull and upper cervical spine: No focal skeletal abnormality. Sinuses/Orbits: Mucosal edema paranasal sinuses. Bilateral cataract extraction Other: None IMPRESSION: Incomplete study.  Patient was unable to complete the study Interval improvement in hemorrhagic metastasis in the right subinsular white matter and left medial parietal lobe. Additional enhancing lesions are present on the prior MRI of 03/15/2022. Contrast not administered on today's study. Atrophy and chronic ischemic change. Electronically Signed   By: Franchot Gallo M.D.   On:  04/27/2022 12:51   CT HEAD WO CONTRAST (5MM)  Result Date: 04/26/2022 CLINICAL DATA:  Transient altered mental status, now resolved, metastatic lung cancer EXAM: CT HEAD WITHOUT CONTRAST TECHNIQUE: Contiguous axial images were obtained from the base of the skull through the vertex without intravenous contrast. RADIATION DOSE REDUCTION: This exam was performed according to the departmental dose-optimization program which includes automated exposure control, adjustment of the mA and/or kV according to patient  size and/or use of iterative reconstruction technique. COMPARISON:  MR brain, 03/15/2022 FINDINGS: Brain: Small focus of hemorrhage of the medial left parietal vertex, directly posterior to the medial central sulcus, measuring 1.5 x 1.4 x 1.2 cm (series 2, image 20, series 5, image 46). No evidence of acute infarction, hydrocephalus, extra-axial collection or directly visualized mass lesion/mass effect. Periventricular and deep white matter hypodensity. Vascular: No hyperdense vessel or unexpected calcification. Skull: Normal. Negative for fracture or focal lesion. Sinuses/Orbits: No acute finding. Other: None. IMPRESSION: 1. Small focus of hemorrhage of the medial left parietal vertex, directly posterior to the medial central sulcus, measuring 1.5 x 1.4 x 1.2 cm. This corresponds to a contrast enhancing metastasis demonstrated by recent prior MR. No evidence of mass effect or midline shift. 2. Small-vessel white matter disease. These results were called by telephone at the time of interpretation on 04/26/2022 at 3:14 pm to PA [facility] , who verbally acknowledged these results. Electronically Signed   By: Delanna Ahmadi M.D.   On: 04/26/2022 15:19   IR IMAGING GUIDED PORT INSERTION  Result Date: 04/19/2022 INDICATION: 81 year old male with metastatic small cell lung cancer. Port catheter needed to establish durable venous access. EXAM: IMPLANTED PORT A CATH PLACEMENT WITH ULTRASOUND AND FLUOROSCOPIC GUIDANCE MEDICATIONS: None. ANESTHESIA/SEDATION: Versed 1 mg IV; Fentanyl 50 mcg IV; Moderate Sedation Time:  16 minutes The patient's vital signs and level of consciousness were continuously monitored during the procedure by the interventional radiology nurse under my direct supervision. FLUOROSCOPY: Radiation exposure index: 3 mGy reference air kerma COMPLICATIONS: None immediate. PROCEDURE: The right neck and chest was prepped with chlorhexidine, and draped in the usual sterile fashion using maximum barrier  technique (cap and mask, sterile gown, sterile gloves, large sterile sheet, hand hygiene and cutaneous antiseptic). Local anesthesia was attained by infiltration with 1% lidocaine with epinephrine. Ultrasound demonstrated patency of the right internal jugular vein, and this was documented with an image. Under real-time ultrasound guidance, this vein was accessed with a 21 gauge micropuncture needle and image documentation was performed. A small dermatotomy was made at the access site with an 11 scalpel. A 0.018" wire was advanced into the SVC and the access needle exchanged for a 20F micropuncture vascular sheath. The 0.018" wire was then removed and a 0.035" wire advanced into the IVC. An appropriate location for the subcutaneous reservoir was selected below the clavicle and an incision was made through the skin and underlying soft tissues. The subcutaneous tissues were then dissected using a combination of blunt and sharp surgical technique and a pocket was formed. A single lumen power injectable portacatheter was then tunneled through the subcutaneous tissues from the pocket to the dermatotomy and the port reservoir placed within the subcutaneous pocket. The venous access site was then serially dilated and a peel away vascular sheath placed over the wire. The wire was removed and the port catheter advanced into position under fluoroscopic guidance. The catheter tip is positioned in the superior cavoatrial junction. This was documented with a spot image. The portacatheter was then tested  and found to flush and aspirate well. The port was flushed with saline followed by 100 units/mL heparinized saline. The pocket was then closed in two layers using first subdermal inverted interrupted absorbable sutures followed by a running subcuticular suture. The epidermis was then sealed with Dermabond. The dermatotomy at the venous access site was also closed with Dermabond. IMPRESSION: Successful placement of a right IJ  approach Power Port with ultrasound and fluoroscopic guidance. The catheter is ready for use. Electronically Signed   By: Jacqulynn Cadet M.D.   On: 04/19/2022 17:11     Subjective: No acute issues or events overnight, denies nausea vomiting diarrhea constipation headache fevers chills or chest pain  Discharge Exam: Vitals:   04/27/22 0334 04/27/22 0935  BP: (!) 143/92 126/68  Pulse: (!) 103 (!) 107  Resp: 18 20  Temp: 97.9 F (36.6 C) 98.1 F (36.7 C)  SpO2: 99% 95%   Vitals:   04/26/22 2031 04/26/22 2204 04/27/22 0334 04/27/22 0935  BP: 134/80 132/73 (!) 143/92 126/68  Pulse: 78 97 (!) 103 (!) 107  Resp: 18 18 18 20   Temp: 99 F (37.2 C) 98.9 F (37.2 C) 97.9 F (36.6 C) 98.1 F (36.7 C)  TempSrc: Oral Oral Oral Oral  SpO2: 97% 97% 99% 95%  Weight:      Height:        General: Pt is alert, awake, not in acute distress Cardiovascular: RRR, S1/S2 +, no rubs, no gallops Respiratory: CTA bilaterally, no wheezing, no rhonchi Abdominal: Soft, NT, ND, bowel sounds + Extremities: no edema, no cyanosis    The results of significant diagnostics from this hospitalization (including imaging, microbiology, ancillary and laboratory) are listed below for reference.     Microbiology: Recent Results (from the past 240 hour(s))  MRSA Next Gen by PCR, Nasal     Status: None   Collection Time: 04/26/22 10:37 PM   Specimen: Nasal Mucosa; Nasal Swab  Result Value Ref Range Status   MRSA by PCR Next Gen NOT DETECTED NOT DETECTED Final    Comment: (NOTE) The GeneXpert MRSA Assay (FDA approved for NASAL specimens only), is one component of a comprehensive MRSA colonization surveillance program. It is not intended to diagnose MRSA infection nor to guide or monitor treatment for MRSA infections. Test performance is not FDA approved in patients less than 30 years old. Performed at [facility], [place] [address], [place], [place] [address]      Labs: BNP (last 3 results) No  results for input(s): "BNP" in the last 8760 hours. Basic Metabolic Panel: Recent Labs  Lab 04/23/22 1100 04/26/22 1433 04/27/22 0514  NA 134* 135 134*  K 4.0 4.5 4.1  CL 102 102 101  CO2 24 23 22   GLUCOSE 158* 92 102*  BUN 30* 34* 23  CREATININE 0.89 0.97 0.85  CALCIUM 9.3 8.7* 8.8*   Liver Function Tests: Recent Labs  Lab 04/23/22 1100 04/26/22 1433 04/27/22 0514  AST 24 30 27   ALT 28 30 25   ALKPHOS 52 52 45  BILITOT 0.6 0.9 0.9  PROT 6.5 6.5 5.8*  ALBUMIN 3.5 3.1* 2.7*   No results for input(s): "LIPASE", "AMYLASE" in the last 168 hours. No results for input(s): "AMMONIA" in the last 168 hours. CBC: Recent Labs  Lab 04/23/22 1100 04/26/22 1433 04/27/22 0514  WBC 10.6* 9.9 9.0  NEUTROABS 8.0*  --   --   HGB 11.8* 12.6* 11.5*  HCT 35.8* 37.7* 34.1*  MCV 93.2 94.3 92.7  PLT 230 226 215   Cardiac Enzymes: No results for input(s): "CKTOTAL", "CKMB", "CKMBINDEX", "TROPONINI" in the last 168 hours. BNP: Invalid input(s): "POCBNP" CBG: Recent Labs  Lab 04/26/22 1407 04/26/22 2236 04/27/22 1020  GLUCAP 89 106* 98   D-Dimer No results for input(s): "DDIMER" in the last 72 hours. Hgb A1c Recent Labs    04/26/22 2307  HGBA1C 6.5*   Lipid Profile No results for input(s): "CHOL", "HDL", "LDLCALC", "TRIG", "CHOLHDL", "LDLDIRECT" in the last 72 hours. Thyroid function studies No results for input(s): "TSH", "T4TOTAL", "T3FREE", "THYROIDAB" in the last 72 hours.  Invalid input(s): "FREET3" Anemia work up No results for input(s): "VITAMINB12", "FOLATE", "FERRITIN", "TIBC", "IRON", "RETICCTPCT" in the last 72 hours. Urinalysis    Component Value Date/Time   COLORURINE YELLOW 04/26/2022 1352   APPEARANCEUR CLEAR 04/26/2022 1352   LABSPEC 1.020 04/26/2022 1352   PHURINE 5.0 04/26/2022 1352   GLUCOSEU NEGATIVE 04/26/2022 1352   GLUCOSEU NEGATIVE 11/15/2019 1150   HGBUR NEGATIVE 04/26/2022 1352   BILIRUBINUR NEGATIVE 04/26/2022 1352   KETONESUR 20 (A)  04/26/2022 1352   PROTEINUR NEGATIVE 04/26/2022 1352   UROBILINOGEN 1.0 11/15/2019 1150   NITRITE NEGATIVE 04/26/2022 1352   LEUKOCYTESUR NEGATIVE 04/26/2022 1352   Sepsis Labs Recent Labs  Lab 04/23/22 1100 04/26/22 1433 04/27/22 0514  WBC 10.6* 9.9 9.0   Microbiology Recent Results (from the past 240 hour(s))  MRSA Next Gen by PCR, Nasal     Status: None   Collection Time: 04/26/22 10:37 PM   Specimen: Nasal Mucosa; Nasal Swab  Result Value Ref Range Status   MRSA by PCR Next Gen NOT DETECTED NOT DETECTED Final    Comment: (NOTE) The GeneXpert MRSA Assay (FDA approved for NASAL specimens only), is one component of a comprehensive MRSA colonization surveillance program. It is not intended to diagnose MRSA infection nor to guide or monitor treatment for MRSA infections. Test performance is not FDA approved in patients less than 8 years old. Performed at [facility], [place] [address] [place], [place] [address]      Time coordinating discharge: Over 30 minutes  SIGNED:   Little Ishikawa, DO [facility] 04/27/2022, 1:56 PM Pager   If 7PM-7AM, please contact night-coverage [email or web address]

## 2022-04-27 NOTE — Progress Notes (Signed)
Hematology and Oncology Follow Up Visit  Alphonse Asbridge [id number] May 13, 1941 81 y.o. 04/27/2022   Principle Diagnosis:  Extensive stage small cell lung cancer-CNS metastasis  Current Therapy:   Cranial radiotherapy --completed on 04/13/2022 Carboplatin/VP-16/Tecentriq -- start cycle #1 on 05/03/2022     Interim History:  Mr. Sofranko is, unfortunately, admitted to [facility].  His wife called yesterday.  She said that he was not feeling well.  He was moaning.  He had a little bit of confusion.  He went to the emergency room.  He had a CT scan of the brain done.  Unfortunate, he had a hemorrhagic met.  This was in the medial left parietal vertex.  This measured 1.5 x 1.4 x 1.2 cm.  No mass effect was noted.   He had been on Eliquis because of atrial fibrillation.  He was given Andexxa to reverse the Eliquis.  He seems quite lucid right now.  His wife says he still not eating.  He has a sore mouth.  He has not yet had an MRI of the brain.  We had him set up with chemotherapy to start after Thanksgiving.  I think the MRI will be key.  He has had no double vision.  He has had no nausea or vomiting.  He has had no bowel or bladder incontinence.  He has had no focal weakness.  He says he just feels weak all over.  When we saw him in the office a week ago, we did give him some IV fluids.  He had a little bit of thrush.  We put him on some Diflucan.  He is on a Decadron taper.  He has had no fever.  Obviously, there is been no bleeding.  Currently, I would say his performance status is probably ECOG 2.    Medications:  Current Facility-Administered Medications:    acetaminophen (TYLENOL) tablet 650 mg, 650 mg, Oral, Q6H PRN **OR** acetaminophen (TYLENOL) suppository 650 mg, 650 mg, Rectal, Q6H PRN, Donne Hazel, MD   atenolol (TENORMIN) tablet 25 mg, 25 mg, Oral, Daily, Donne Hazel, MD   atorvastatin (LIPITOR) tablet 40 mg, 40 mg, Oral, Daily, Donne Hazel, MD,  40 mg at 04/26/22 1847   Chlorhexidine Gluconate Cloth 2 % PADS 6 each, 6 each, Topical, Daily, Donne Hazel, MD   HYDROmorphone (DILAUDID) injection 0.5-1 mg, 0.5-1 mg, Intravenous, Q2H PRN, Donne Hazel, MD   insulin aspart (novoLOG) injection 0-15 Units, 0-15 Units, Subcutaneous, TID WC, Donne Hazel, MD   insulin aspart (novoLOG) injection 0-5 Units, 0-5 Units, Subcutaneous, QHS, Donne Hazel, MD   levothyroxine (SYNTHROID) tablet 75 mcg, 75 mcg, Oral, Daily, Donne Hazel, MD, 75 mcg at 04/27/22 1696   LORazepam (ATIVAN) injection 1 mg, 1 mg, Intravenous, Once PRN, Opyd, Ilene Qua, MD   melatonin tablet 3 mg, 3 mg, Oral, QHS PRN, Opyd, Ilene Qua, MD, 3 mg at 04/27/22 0206   sodium chloride flush (NS) 0.9 % injection 10-40 mL, 10-40 mL, Intracatheter, Q12H, Donne Hazel, MD, 20 mL at 04/27/22 0201   sodium chloride flush (NS) 0.9 % injection 10-40 mL, 10-40 mL, Intracatheter, PRN, Donne Hazel, MD   tamsulosin [facility]) capsule 0.4 mg, 0.4 mg, Oral, Daily, Donne Hazel, MD, 0.4 mg at 04/26/22 1847  Allergies:  Allergies  Allergen Reactions   Adhesive [Tape] Rash   Latex Rash   Other Hives, Swelling and Rash    Plastic shopping bags Metal staples:  rash, swelling    Past Medical History, Surgical history, Social history, and Family History were reviewed and updated.  Review of Systems: Review of Systems  Constitutional: Negative.   HENT:  Negative.    Eyes: Negative.   Respiratory: Negative.    Cardiovascular: Negative.   Gastrointestinal: Negative.   Endocrine: Negative.   Genitourinary: Negative.    Musculoskeletal: Negative.   Skin: Negative.   Neurological: Negative.   Hematological: Negative.   Psychiatric/Behavioral: Negative.      Physical Exam:  height is 6' (1.829 m) and weight is 175 lb 14.8 oz (79.8 kg). His oral temperature is 97.9 F (36.6 C). His blood pressure is 143/92 (abnormal) and his pulse is 103 (abnormal). His respiration is 18  and oxygen saturation is 99%.   Wt Readings from Last 3 Encounters:  04/26/22 175 lb 14.8 oz (79.8 kg)  04/23/22 176 lb (79.8 kg)  04/19/22 180 lb (81.6 kg)    Physical Exam Vitals reviewed.  HENT:     Head: Normocephalic and atraumatic.  Eyes:     Pupils: Pupils are equal, round, and reactive to light.  Cardiovascular:     Rate and Rhythm: Normal rate and regular rhythm.     Heart sounds: Normal heart sounds.  Pulmonary:     Effort: Pulmonary effort is normal.     Breath sounds: Normal breath sounds.  Abdominal:     General: Bowel sounds are normal.     Palpations: Abdomen is soft.  Musculoskeletal:        General: No tenderness or deformity. Normal range of motion.     Cervical back: Normal range of motion.  Lymphadenopathy:     Cervical: No cervical adenopathy.  Skin:    General: Skin is warm and dry.     Findings: No erythema or rash.  Neurological:     Mental Status: He is alert and oriented to person, place, and time.  Psychiatric:        Behavior: Behavior normal.        Thought Content: Thought content normal.        Judgment: Judgment normal.      Lab Results  Component Value Date   WBC 9.0 04/27/2022   HGB 11.5 (L) 04/27/2022   HCT 34.1 (L) 04/27/2022   MCV 92.7 04/27/2022   PLT 215 04/27/2022     Chemistry      Component Value Date/Time   NA 134 (L) 04/27/2022 0514   NA 139 02/04/2022 0921   K 4.1 04/27/2022 0514   CL 101 04/27/2022 0514   CO2 22 04/27/2022 0514   BUN 23 04/27/2022 0514   BUN 21 02/04/2022 0921   CREATININE 0.85 04/27/2022 0514   CREATININE 0.89 04/23/2022 1100   CREATININE 1.16 (H) 04/03/2020 1044      Component Value Date/Time   CALCIUM 8.8 (L) 04/27/2022 0514   ALKPHOS 45 04/27/2022 0514   AST 27 04/27/2022 0514   AST 24 04/23/2022 1100   ALT 25 04/27/2022 0514   ALT 28 04/23/2022 1100   BILITOT 0.9 04/27/2022 0514   BILITOT 0.6 04/23/2022 1100      Impression and Plan: Mr. Azbill is a very nice 81 year old  white male.  He has extensive stage small cell lung cancer.  He completed radiation therapy to the brain I think couple weeks ago.  He now has a hemorrhagic met.  Again the MRI of the brain will be helpful.  I do not know if he needs  any type of stereotactic radiosurgery for this hemorrhagic met.  I would see if Radiation Oncology can see him and see what they have to say about this.  He clearly will have to be off the Eliquis for a couple weeks.  I will let cardiology and or neurology manages this decision.  His labs all look pretty good.  I still think that we can keep him on schedule for treatment with systemic chemotherapy on the 27th.  I think this will also help with his CNS metastasis.  We will follow along.  Hopefully, he will not be hospitalized for all that long.     Volanda Napoleon, MD 11/21/20237:00 AM

## 2022-04-27 NOTE — Progress Notes (Addendum)
Neurology Progress Note    Reason for Consult:hemorrhage around known metastasis  CC: altered mental status   HISTORY OF PRESENT ILLNESS   Brief HPI   Harry MOTTOLA Sr. is a 81 y.o. 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  He completed radiation therapy on 11/19 and has not yet started chemotherapy. He has not been eating or sleeping well recently. He did not sleep well overnight and then woke up confused this morning. He had taken a nap and then woke up with unintelligible speech and perseverative speech. Wife then called the [facility] and Dr. Marin Olp recommended she bring him to the ED. CT head shows a small hemorrhage around a known metastatic tumor in the left parietal vertex. He has been compliant with his eliquis, neurosurgery was also consulted and recommended reversal of the eliquis with andexxa.  Interval History No events overnight, still unable to sleep. Racing thoughts keeping him awake, which he does not tend to experience during the day. Denies hallucination, both visual and auditory, none tactile either. Wife can confirm this. Denies headache or other pain keeping him up. No abnormal movements, alteration in his mental status different from what is now present, visual changes, problems with swallowing or speech, focal muscle weakness (just generally weak), numbness or tingling of his extremities, or other focal neurological deficits.  EEG with generalized slowing, no focal epileptiform abnormalities otherwise   ROS: Full ROS was performed and is negative except as noted in the HPI.   PAST MEDICAL HISTORY    Past Medical History:  Past Medical History:  Diagnosis Date   Anemia 09/24/2013   CAD (coronary artery disease)    Cellulitis 05/14/2013   RLE   Diverticulitis    Dyspnea    03/15/22- due  to fluid in lung.   ED (erectile dysfunction)    Glaucoma 08/18/2014   History of sleep apnea    had surgery to correct   Hypertension    Hypothyroidism    Medicare annual wellness visit, subsequent 04/13/2013   Myocardial infarction ([place]) 11/05/1985   Nocturia 03/15/2017   Other and unspecified hyperlipidemia    Penile lesion 02/24/2015   Peripheral neuropathy 03/31/2012   Peripheral vascular disease ([place])    Postoperative anemia due to acute blood loss 09/24/2013   Preventative health care 03/13/2016   Sleep apnea 09/16/2017   Small cell carcinoma of lung metastatic to lymph nodes of multiple sites ([place]) 03/26/2022   Small cell lung carcinoma, right ([place]) 03/26/2022   Type II diabetes mellitus ([facility])    fasting 100-120   Urinary urgency 11/22/2012    No family history on file. Family History  Problem Relation Age of Onset   Hyperlipidemia Mother    Heart disease Mother    Diabetes Mother    Hypertension Mother    Heart disease Father    Asthma Father    Arthritis Father    Heart disease Sister    Heart disease Sister        s/p bypass x 2   Lupus Sister    Heart disease Brother        MI waiting on heart transplant when died   Heart disease Brother        massive MI   Cancer Neg Hx     Allergies:  Allergies  Allergen Reactions  Adhesive [Tape] Rash   Latex Rash   Other Hives, Swelling and Rash    Plastic shopping bags Metal staples: rash, swelling    Social History:   reports that he quit smoking about 17 years ago. His smoking use included cigarettes. He has a 75.00 pack-year smoking history. He has never used smokeless tobacco. He reports that he does not drink alcohol and does not use drugs.    Medications Medications Prior to Admission  Medication Sig Dispense Refill   atenolol (TENORMIN) 25 MG tablet TAKE ONE (1) TABLET BY MOUTH EVERY DAY (Patient taking differently: Take 25 mg by mouth daily.) 90 tablet 0   atorvastatin (LIPITOR) 40 MG tablet Take  1 tablet (40 mg total) by mouth daily. 90 tablet 0   Cholecalciferol (VITAMIN D) 50 MCG (2000 UT) tablet Take 2,000 Units by mouth daily.      dexamethasone (DECADRON) 4 MG tablet Take 1 tablet (4 mg total) by mouth daily. (Patient taking differently: Take 2 mg by mouth every other day.) 30 tablet 0   ELIQUIS 5 MG TABS tablet TAKE ONE (1) TABLET BY MOUTH TWO (2) TIMES DAILY (Patient taking differently: Take 5 mg by mouth 2 (two) times daily.) 60 tablet 5   latanoprost (XALATAN) 0.005 % ophthalmic solution Place 1 drop into both eyes at bedtime.      levothyroxine (SYNTHROID) 75 MCG tablet TAKE ONE (1) TABLET BY MOUTH EVERY DAY (Patient taking differently: Take 75 mcg by mouth daily.) 90 tablet 0   lidocaine-prilocaine (EMLA) cream Apply 1 Application topically as needed. (Patient taking differently: Apply 1 Application topically as needed (ears).) 30 g 3   loratadine (CLARITIN) 10 MG tablet Take 10 mg by mouth daily as needed for allergies.     metFORMIN (GLUCOPHAGE-XR) 500 MG 24 hr tablet TAKE TWO (2) TABLETS BY MOUTH EVERY MORNING WITH BREAKFAST (Patient taking differently: Take 1,000 mg by mouth in the morning.) 180 tablet 1   nitroGLYCERIN (NITROSTAT) 0.4 MG SL tablet Place 1 tablet (0.4 mg total) under the tongue every 5 (five) minutes as needed for chest pain. 25 tablet 11   ondansetron (ZOFRAN) 8 MG tablet Take 1 tablet (8 mg total) by mouth every 8 (eight) hours as needed for nausea, vomiting or refractory nausea / vomiting. Start on the third day after carboplatin. 30 tablet 1   prochlorperazine (COMPAZINE) 10 MG tablet Take 1 tablet (10 mg total) by mouth every 6 (six) hours as needed for nausea or vomiting. 30 tablet 1   tamsulosin (FLOMAX) 0.4 MG CAPS capsule Take 1 capsule (0.4 mg total) by mouth daily. 90 capsule 1   vitamin B-12 (CYANOCOBALAMIN) 1000 MCG tablet Take 1,000 mcg by mouth daily.     dexamethasone (DECADRON) 4 MG tablet Take 2 tabs by mouth starting day after last dose of  etoposide for one day only. Repeat every 21 days. Take with food. (Patient not taking: Reported on 04/26/2022) 8 tablet 0   fluconazole (DIFLUCAN) 100 MG tablet Take 1 tablet (100 mg total) by mouth daily. (Patient not taking: Reported on 04/26/2022) 14 tablet 0   losartan (COZAAR) 25 MG tablet Take 1 tablet (25 mg total) by mouth daily. (Patient not taking: Reported on 04/26/2022) 90 tablet 1   ONETOUCH DELICA LANCETS 86P MISC USE TO CHECK BLOOD SUGAR ONCE DAILY 100 each 1   ONETOUCH ULTRA test strip USE 1 STRIP DAILY 100 each 12     EXAMINATION    Current vital signs:  04/27/2022    3:34 AM 04/26/2022   10:04 PM 04/26/2022    8:31 PM  Vitals with BMI  Systolic 263 785 885  Diastolic 92 73 80  Pulse 027 97 78    Examination:  GENERAL: Awake, alert in NAD, Thin caucasian male HEENT: - Normocephalic and atraumatic, dry mm, no lymphadenopathy, no Thyromegally LUNGS - Clear to auscultation bilaterally CV - S1S2 RRR, equal pulses bilaterally. ABDOMEN - Soft, nontender, nondistended with normoactive BS Ext: warm, well perfused, intact peripheral pulses, no pedal edema  NEURO:  Mental Status: AA&Ox2 at first, stating it is 2043, but able to correct given time.   Language: speech is clear.  Intact naming, repetition, fluency, and comprehension. Unable to name president prior to [facility].impaired attention span and difficulty hearing obvious.  Cranial Nerves:  II: PERRL. Visual fields full to confrontation.  III, IV, VI: EOM in tact. Eyelids elevate symmetrically. Blinks to threat.  V: Sensation intact V1-3 symmetrically  VII: no facial asymmetry   VIII: hearing intact to voice IX, X: Palate elevates symmetrically. Phonation is normal.  XA:JOINOMVE shrug 5/5 and symmetrical  XII: tongue is midline without fasciculations. Motor:  RUE:  5/5     LUE: 5/5 RLE: 5/5       LLE: 5/5  Tone: is normal, he appears frail DTRs: 2+ and symmetrical throughout   Sensation- Intact to light  touch, pin prick, vibratory sensation, and temperature bilaterally Coordination: FTN intact bilaterally, no ataxia in BLE., 1-2 beats left UE asterixis versus clonus   Gait- slow, careful  but ambulates with assistance of author and patient's wife, no ataxia  LABS   I have reviewed labs in epic and the results pertinent to this consultation are:  Lab Results  Component Value Date   LDLCALC 76 12/16/2021   Lab Results  Component Value Date   ALT 25 04/27/2022   AST 27 04/27/2022   ALKPHOS 45 04/27/2022   BILITOT 0.9 04/27/2022   Lab Results  Component Value Date   HGBA1C 6.5 (H) 04/26/2022   Lab Results  Component Value Date   WBC 9.0 04/27/2022   HGB 11.5 (L) 04/27/2022   HCT 34.1 (L) 04/27/2022   MCV 92.7 04/27/2022   PLT 215 04/27/2022   Lab Results  Component Value Date   VITAMINB12 360 08/10/2021   Lab Results  Component Value Date   FOLATE >24.2 08/10/2021   Lab Results  Component Value Date   NA 134 (L) 04/27/2022   K 4.1 04/27/2022   CL 101 04/27/2022   CO2 22 04/27/2022    DIAGNOSTIC IMAGING/PROCEDURES   I have reviewed the images obtained:, as below    CT-head 1. Small focus of hemorrhage of the medial left parietal vertex, directly posterior to the medial central sulcus, measuring 1.5 x 1.4 x 1.2 cm. This corresponds to a contrast enhancing metastasis demonstrated by recent prior MR. No evidence of mass effect or midline shift. 2. Small-vessel white matter disease. CTA head and neck   MRI brain- 03/17/2022 Approximately 5-6 minimally enhancing intracranial metastases. Largest lesions are located in the left parietal lobe and right external capsule. Mild edema without midline shift.  EEG 04/27/2022  Description: The posterior dominant rhythm consists of 8 Hz activity of moderate voltage (25-35 uV) seen predominantly in posterior head regions, symmetric and reactive to eye opening and eye closing. EEG showed continuous generalized 3 to 6 Hz  theta-delta slowing. Hyperventilation and photic stimulation were not performed.      ABNORMALITY -  Continuous slow, generalized   IMPRESSION: This study is suggestive of moderate diffuse encephalopathy, nonspecific etiology. No seizures or epileptiform discharges were seen throughout the recording.   ASSESSMENT/PLAN    Assessment:  81 y.o. male with a past medical history as above, most pertinently of small cell lung cancer with metastatic disease to left parietal convexity and right external capsule, who presented for alteration in mental status following initiation of radiation last week, found to have hemorrhagic conversion of this left parietal vertex metastasis for which apixaban being held (on for afib).  Although deficits had previously been described as transient, further discussion with patient and wife reveal that this has been a gradual and more constant issue over the past week rather than intermittent.  There are no focal deficits on examination aside from impaired attention span rather than true aphasia or neglect. This is more indicative of generalized encephalopathy/ cerebral dysfunction as evidenced by generalized slowing on EEG.Thus, main target of therapy should focus on treating insomnia, but will obtain MRI w and wo to rule out novel or enlargement of known brain metastases which may change treatment of cancer or novel lesions such as ischemic infarctions.   Impression:Altered mental status in the setting of hemorrhagic left parietal vertex metastatic tumor and insomnia   Recommendations: - Hold eliquis for at least a couple of weeks, repeat CT head outpatient to ensure resolution of hemorrhage prior to considering resuming eliquis. Secondary to apixaban use and friability 2/2 radiation  - EEG reassuring, no indicatio anti-epileptic medication  - Maintain seizure precautions and delirium precautions. Make sure staying awake during day as to to optimize sleep this evening -  MRI brain with and without contrast pending  -can continue melatonin 3mg   -consuider trazodone 25mg  qhs, alternative would be seroquel but former more appropriate in absence of psychiatric symptoms  -neurology will follow  -- Patient seen and examined by Dr. Leonel Ramsay, who will update note as needed.   Lennie Hummer, PA-C Neurology  Pager: [phone]  I have seen the patient reviewed the above note.  The episode of confusion yesterday is of unclear etiology, but certainly given that it arose from sleep, mild delirium on awakening in the setting of recent hemorrhage, brain mets, recent brain radiation will be my primary consideration.  Seizure is also a possibility, but this is not definite by any means.  With negative EEG, I would not favor antiepileptics.  If he were to have further episodes of similar nature, could consider empiric antiepileptics at that time.  The small amount of bleeding into the tumor I do not think is of particular concern, but I do agree that I would favor holding off on anticoagulation until repeat imaging in a couple of weeks to ensure stability.  At this time no further inpatient workup, he can follow-up with outpatient neurology as needed.  Please call with further questions or concerns.  Roland Rack, MD [facility] [phone]  If 7pm- 7am, please page neurology on call as listed in [place].

## 2022-04-28 DIAGNOSIS — C3490 Malignant neoplasm of unspecified part of unspecified bronchus or lung: Secondary | ICD-10-CM | POA: Diagnosis not present

## 2022-04-28 DIAGNOSIS — C778 Secondary and unspecified malignant neoplasm of lymph nodes of multiple regions: Secondary | ICD-10-CM | POA: Diagnosis not present

## 2022-04-28 DIAGNOSIS — E43 Unspecified severe protein-calorie malnutrition: Secondary | ICD-10-CM | POA: Insufficient documentation

## 2022-04-28 DIAGNOSIS — I613 Nontraumatic intracerebral hemorrhage in brain stem: Secondary | ICD-10-CM | POA: Diagnosis not present

## 2022-04-28 LAB — COMPREHENSIVE METABOLIC PANEL
ALT: 26 U/L (ref 0–44)
AST: 30 U/L (ref 15–41)
Albumin: 2.9 g/dL — ABNORMAL LOW (ref 3.5–5.0)
Alkaline Phosphatase: 48 U/L (ref 38–126)
Anion gap: 12 (ref 5–15)
BUN: 24 mg/dL — ABNORMAL HIGH (ref 8–23)
CO2: 22 mmol/L (ref 22–32)
Calcium: 8.8 mg/dL — ABNORMAL LOW (ref 8.9–10.3)
Chloride: 100 mmol/L (ref 98–111)
Creatinine, Ser: 0.93 mg/dL (ref 0.61–1.24)
GFR, Estimated: >60 mL/min (ref >60)
Glucose, Bld: 88 mg/dL (ref 70–99)
Potassium: 4.3 mmol/L (ref 3.5–5.1)
Sodium: 134 mmol/L — ABNORMAL LOW (ref 135–145)
Total Bilirubin: 1 mg/dL (ref 0.3–1.2)
Total Protein: 6.3 g/dL — ABNORMAL LOW (ref 6.5–8.1)

## 2022-04-28 LAB — TSH: TSH: 2.983 u[IU]/mL (ref 0.350–4.500)

## 2022-04-28 LAB — CBC WITH DIFFERENTIAL/PLATELET
Abs Immature Granulocytes: 0.11 10*3/uL — ABNORMAL HIGH (ref 0.00–0.07)
Basophils Absolute: 0 10*3/uL (ref 0.0–0.1)
Basophils Relative: 0 %
Eosinophils Absolute: 0.2 10*3/uL (ref 0.0–0.5)
Eosinophils Relative: 2 %
HCT: 37.2 % — ABNORMAL LOW (ref 39.0–52.0)
Hemoglobin: 12.1 g/dL — ABNORMAL LOW (ref 13.0–17.0)
Immature Granulocytes: 1 %
Lymphocytes Relative: 10 %
Lymphs Abs: 0.9 10*3/uL (ref 0.7–4.0)
MCH: 30.2 pg (ref 26.0–34.0)
MCHC: 32.5 g/dL (ref 30.0–36.0)
MCV: 92.8 fL (ref 80.0–100.0)
Monocytes Absolute: 0.9 10*3/uL (ref 0.1–1.0)
Monocytes Relative: 10 %
Neutro Abs: 6.8 10*3/uL (ref 1.7–7.7)
Neutrophils Relative %: 77 %
Platelets: 239 10*3/uL (ref 150–400)
RBC: 4.01 MIL/uL — ABNORMAL LOW (ref 4.22–5.81)
RDW: 14.7 % (ref 11.5–15.5)
WBC: 9 10*3/uL (ref 4.0–10.5)
nRBC: 0 % (ref 0.0–0.2)

## 2022-04-28 LAB — GLUCOSE, CAPILLARY: Glucose-Capillary: 77 mg/dL (ref 70–99)

## 2022-04-28 LAB — HEMOGLOBIN A1C
Hgb A1c MFr Bld: 6.5 % — ABNORMAL HIGH (ref 4.8–5.6)
Mean Plasma Glucose: 139.85 mg/dL

## 2022-04-28 MED ORDER — DRONABINOL 2.5 MG PO CAPS: 2.5000 mg | ORAL_CAPSULE | Freq: Two times a day (BID) | ORAL | Status: DC

## 2022-04-28 MED ORDER — HEPARIN SOD (PORK) LOCK FLUSH 100 UNIT/ML IV SOLN
500.0000 [IU] | INTRAVENOUS | Status: AC | PRN
  Administered 2022-04-28: 500 [IU]

## 2022-04-28 NOTE — Progress Notes (Signed)
Physical Therapy Treatment Patient Details Name: EPIC TRIBBETT Sr. MRN: [id number] DOB: 04/14/1941 Today's Date: 04/28/2022   History of Present Illness 81 yo male [facility] to [facility] 11/20 with AMS, slurred speech. CT showed hemorrhagic conversion of left parietal vertex metastasis. PMH includes metastatic lung cancer with mets to the brain, recently completed radiation therapy and not yet started on chemotherapy, A-fib on Eliquis, hypertension, diabetes, CAD, DMII with peripheral neuropathy.    PT Comments    Pt continues to require min guard throughout gait with SPC secondary to general unsteadiness and up to min assist to correct x1 LOB to R with right head turn. Pt able to accept mild balance challenges with head turns to L without LOB. Pt continues to fatigue quickly needing seated recovery break during ambulation. Educated pt and family re; DME use and safety with mobility, with pt and family verbalizing understanding. Pt continues to benefit from skilled PT services to progress toward functional mobility goals.    Recommendations for follow up therapy are one component of a multi-disciplinary discharge planning process, led by the attending physician.  Recommendations may be updated based on patient status, additional functional criteria and insurance authorization.  Follow Up Recommendations  Other (comment) (PT recommended follow up PT, pt and family request no PT follow up at this time)     Assistance Recommended at Discharge Intermittent Supervision/Assistance  Patient can return home with the following A little help with walking and/or transfers   Equipment Recommendations  None recommended by PT    Recommendations for Other Services       Precautions / Restrictions Precautions Precautions: Fall Restrictions Weight Bearing Restrictions: No     Mobility  Bed Mobility Overal bed mobility: Needs Assistance             General bed mobility comments: pt OOB in  recliner pre and post session    Transfers Overall transfer level: Needs assistance Equipment used: Straight cane, None Transfers: Sit to/from Stand Sit to Stand: Supervision           General transfer comment: slow to rise, use of UEs to power up, able to complet x5 without AD at end of session    Ambulation/Gait Ambulation/Gait assistance: Min guard, Min assist Gait Distance (Feet): 200 Feet (w/ x1  setaed rest break) Assistive device: Straight cane Gait Pattern/deviations: Step-through pattern, Decreased stride length, Staggering left Gait velocity: decr     General Gait Details: close guard for safety, cues for sequencing with use of SPC not followed, pt listing R, x1 LOB to R with head turns   Chief Strategy Officer    Modified Rankin (Stroke Patients Only) Modified Rankin (Stroke Patients Only) Pre-Morbid Rankin Score: Slight disability Modified Rankin: Moderate disability     Balance Overall balance assessment: Needs assistance Sitting-balance support: No upper extremity supported, Feet supported Sitting balance-Leahy Scale: Good     Standing balance support: During functional activity, Single extremity supported Standing balance-Leahy Scale: Fair Standing balance comment: able to static stand without UE support             High level balance activites: Head turns High Level Balance Comments: LOB to R x1 with head turn to R, able to maintain balance with head turns to L            Cognition Arousal/Alertness: Awake/alert Behavior During Therapy: WFL for tasks assessed/performed Overall Cognitive Status: Impaired/Different from baseline Area  of Impairment: Attention, Memory, Following commands, Problem solving                   Current Attention Level: Sustained Memory: Decreased short-term memory Following Commands: Follows one step commands consistently     Problem Solving: Difficulty sequencing, Requires  verbal cues, Requires tactile cues General Comments: patient alert and participatory, requires cues to maintain attention to task but easily redirected        Exercises Other Exercises Other Exercises: 5x STS    General Comments        Pertinent Vitals/Pain Pain Assessment Pain Assessment: No/denies pain    Home Living                          Prior Function            PT Goals (current goals can now be found in the care plan section) Acute Rehab PT Goals PT Goal Formulation: With patient Time For Goal Achievement: 05/11/22    Frequency    Min 4X/week      PT Plan      Co-evaluation              AM-PAC PT "6 Clicks" Mobility   Outcome Measure  Help needed turning from your back to your side while in a flat bed without using bedrails?: A Little Help needed moving from lying on your back to sitting on the side of a flat bed without using bedrails?: A Little Help needed moving to and from a bed to a chair (including a wheelchair)?: A Little Help needed standing up from a chair using your arms (e.g., wheelchair or bedside chair)?: A Little Help needed to walk in hospital room?: A Little Help needed climbing 3-5 steps with a railing? : A Little 6 Click Score: 18    End of Session Equipment Utilized During Treatment: Gait belt Activity Tolerance: Patient tolerated treatment well Patient left: with call bell/phone within reach;with family/visitor present;in chair Nurse Communication: Mobility status PT Visit Diagnosis: Muscle weakness (generalized) (M62.81);Other abnormalities of gait and mobility (R26.89)     Time: 0388-8280 PT Time Calculation (min) (ACUTE ONLY): 15 min  Charges:  $Gait Training: 8-22 mins                     Viraat Vanpatten R. PTA Acute Rehabilitation Services Office: [place] 04/28/2022, 9:09 AM

## 2022-04-28 NOTE — Progress Notes (Signed)
Patient was supposed to be discharged yesterday after MRI brain.  However he was noted to be quite sedated after he received Ativan.  He was monitored overnight.  Feels well this morning.  His son is at the bedside.  Ready to go home today.  No changes to the discharge summary as dictated by Dr. Avon Gully on 04/27/22.

## 2022-04-28 NOTE — Progress Notes (Signed)
Thankfully, Harry Brown is doing well this morning.  I think he had a decent day yesterday.  He still is not eating all that much.  I will put him on some Marinol (2.5 mg p.o. twice daily).  He had MRI of the brain yesterday.  This actually look better.  As such, Radiation Oncology does not think that there is anything that they need to do.  Has been followed by neurology.  He is getting some physical therapy.  There is no lab work on him this morning.  Hopefully, he will be able to go home today.  Still think we can get his chemotherapy started next week.  Actually, the chemotherapy may help with his CNS metastasis.  His vital signs were temperature 98.4.  Pulse 95.  Blood pressure 142/90.  Head neck exam shows no ocular or oral lesions.  He has a decent gag reflex.  Lungs are clear bilaterally.  Cardiac exam is regular rate and irregular rhythm consistent with the atrial fibrillation.  Abdomen is soft.  Extremities she has good movement and strength bilaterally.  Neurological exam shows no focal neurological deficits.  Harry Brown had the CNS bleed and to admit.  Again, the MRI of the brain looked better.  He had regressing CNS metastasis from the radiation that he took.  He is off anticoagulation.  I will have to leave it up to cardiology to decide if they want to put him back on anticoagulation.  I would like to think that if his metastasis improve, he can go back on anticoagulation.  If he does go home today, we will get him back in our office next week for the start of chemotherapy.  I am just glad that he is improving.  [name], MD  [name] 7:7

## 2022-04-28 NOTE — Plan of Care (Signed)
    Problem: Education: Goal: Knowledge of General Education information will improve Description: Including pain rating scale, medication(s)/side effects and non-pharmacologic comfort measures Outcome: Adequate for Discharge   Problem: Health Behavior/Discharge Planning: Goal: Ability to manage health-related needs will improve Outcome: Adequate for Discharge   Problem: Activity: Goal: Risk for activity intolerance will decrease Outcome: Adequate for Discharge

## 2022-05-03 ENCOUNTER — Inpatient Hospital Stay: Payer: Medicare Other

## 2022-05-03 ENCOUNTER — Ambulatory Visit (HOSPITAL_BASED_OUTPATIENT_CLINIC_OR_DEPARTMENT_OTHER)
Admission: RE | Admit: 2022-05-03 | Discharge: 2022-05-03 | Disposition: A | Discharge status: Home / Self Care | Payer: Medicare Other | Source: Ambulatory Visit | Attending: Hematology & Oncology | Admitting: Hematology & Oncology

## 2022-05-03 ENCOUNTER — Telehealth: Payer: Self-pay

## 2022-05-03 ENCOUNTER — Other Ambulatory Visit: Payer: Self-pay

## 2022-05-03 ENCOUNTER — Encounter: Payer: Self-pay | Admitting: Hematology & Oncology

## 2022-05-03 ENCOUNTER — Inpatient Hospital Stay: Payer: Medicare Other | Admitting: Hematology & Oncology

## 2022-05-03 DIAGNOSIS — R911 Solitary pulmonary nodule: Secondary | ICD-10-CM | POA: Diagnosis not present

## 2022-05-03 DIAGNOSIS — C3491 Malignant neoplasm of unspecified part of right bronchus or lung: Secondary | ICD-10-CM

## 2022-05-03 DIAGNOSIS — D849 Immunodeficiency, unspecified: Secondary | ICD-10-CM | POA: Insufficient documentation

## 2022-05-03 DIAGNOSIS — M25551 Pain in right hip: Secondary | ICD-10-CM | POA: Diagnosis not present

## 2022-05-03 DIAGNOSIS — R059 Cough, unspecified: Secondary | ICD-10-CM | POA: Diagnosis not present

## 2022-05-03 DIAGNOSIS — R0602 Shortness of breath: Secondary | ICD-10-CM | POA: Diagnosis not present

## 2022-05-03 LAB — CMP (CANCER CENTER ONLY)
ALT: 48 U/L — ABNORMAL HIGH (ref 0–44)
AST: 51 U/L — ABNORMAL HIGH (ref 15–41)
Albumin: 3.4 g/dL — ABNORMAL LOW (ref 3.5–5.0)
Alkaline Phosphatase: 66 U/L (ref 38–126)
Anion gap: 8 (ref 5–15)
BUN: 36 mg/dL — ABNORMAL HIGH (ref 8–23)
CO2: 24 mmol/L (ref 22–32)
Calcium: 9.5 mg/dL (ref 8.9–10.3)
Chloride: 102 mmol/L (ref 98–111)
Creatinine: 0.86 mg/dL (ref 0.61–1.24)
GFR, Estimated: >60 mL/min (ref >60)
Glucose, Bld: 131 mg/dL — ABNORMAL HIGH (ref 70–99)
Potassium: 4.2 mmol/L (ref 3.5–5.1)
Sodium: 134 mmol/L — ABNORMAL LOW (ref 135–145)
Total Bilirubin: 0.7 mg/dL (ref 0.3–1.2)
Total Protein: 6.6 g/dL (ref 6.5–8.1)

## 2022-05-03 LAB — CBC WITH DIFFERENTIAL (CANCER CENTER ONLY)
Abs Immature Granulocytes: 0.13 10*3/uL — ABNORMAL HIGH (ref 0.00–0.07)
Basophils Absolute: 0 10*3/uL (ref 0.0–0.1)
Basophils Relative: 0 %
Eosinophils Absolute: 0.2 10*3/uL (ref 0.0–0.5)
Eosinophils Relative: 2 %
HCT: 33.7 % — ABNORMAL LOW (ref 39.0–52.0)
Hemoglobin: 11.3 g/dL — ABNORMAL LOW (ref 13.0–17.0)
Immature Granulocytes: 1 %
Lymphocytes Relative: 9 %
Lymphs Abs: 0.9 10*3/uL (ref 0.7–4.0)
MCH: 31 pg (ref 26.0–34.0)
MCHC: 33.5 g/dL (ref 30.0–36.0)
MCV: 92.6 fL (ref 80.0–100.0)
Monocytes Absolute: 0.9 10*3/uL (ref 0.1–1.0)
Monocytes Relative: 9 %
Neutro Abs: 8.1 10*3/uL — ABNORMAL HIGH (ref 1.7–7.7)
Neutrophils Relative %: 79 %
Platelet Count: 313 10*3/uL (ref 150–400)
RBC: 3.64 MIL/uL — ABNORMAL LOW (ref 4.22–5.81)
RDW: 14.6 % (ref 11.5–15.5)
WBC Count: 10.3 10*3/uL (ref 4.0–10.5)
nRBC: 0 % (ref 0.0–0.2)

## 2022-05-03 LAB — TSH: TSH: 3.073 u[IU]/mL (ref 0.350–4.500)

## 2022-05-03 MED ORDER — DRONABINOL 2.5 MG PO CAPS: 2.5000 mg | ORAL_CAPSULE | Freq: Two times a day (BID) | ORAL | 0 refills | Status: DC

## 2022-05-03 NOTE — Patient Instructions (Signed)

## 2022-05-03 NOTE — Telephone Encounter (Signed)
Transition Care Management Unsuccessful Follow-up Telephone Call  Date of discharge and from where:  04/28/22, [facility]  Attempts:  1st Attempt  Reason for unsuccessful TCM follow-up call:  No answer/busy

## 2022-05-03 NOTE — Progress Notes (Signed)
Hematology and Oncology Follow Up Visit  Harry Brown [id number] 1940/12/30 81 y.o. 05/03/2022   Principle Diagnosis:  Extensive stage small cell lung cancer-CNS metastasis  Current Therapy:   Cranial radiotherapy --completed on 04/13/2022 Carboplatin/VP-16/Tecentriq -- start cycle #1 on 05/03/2022     Interim History:  Harry Brown is back for follow-up.  He was just discharged from the hospital on 04/27/2022.  At that time, he came in with some mental status changes.  He ultimately had a MRI of the brain done.  There was a bleed into a CNS metastasis.  The actual image of his brain looked better with respect to the CNS disease that was up there.  Radiation Oncology did not think that anything had to be done for this.  He has been on Eliquis.  This is for atrial fibrillation.  The Eliquis is been stopped.  His problem now is that has pain in the right hip.  We did x-rays of the right hip which just shows some arthritic changes.  There is no metastatic disease or from we did do staging for his small cell lung cancer.  He also has little bit of shortness of breath.  He did have a chest x-ray today.  This did not show any pneumonia.  I suspect the shortness of breath might be from his mediastinal disease.  I just wish he was in better shape.  His performance status is ECOG 2.  His appetite might be a little bit better with Marinol.  I will send in a Marinol prescription for him.  Outside of the right hip pain, he really does not complain of pain elsewhere.  He has had no problems with bowels or bladder.  He has had no leg swelling.  He has had no bleeding.  He has had no rashes.  Overall, I was his performance status is probably ECOG 2.    Medications:  Current Outpatient Medications:    atenolol (TENORMIN) 25 MG tablet, TAKE ONE (1) TABLET BY MOUTH EVERY DAY (Patient taking differently: Take 25 mg by mouth daily.), Disp: 90 tablet, Rfl: 0   atorvastatin (LIPITOR) 40 MG tablet,  Take 1 tablet (40 mg total) by mouth daily., Disp: 90 tablet, Rfl: 0   Cholecalciferol (VITAMIN D) 50 MCG (2000 UT) tablet, Take 2,000 Units by mouth daily. , Disp: , Rfl:    dexamethasone (DECADRON) 4 MG tablet, Take 1 tablet (4 mg total) by mouth daily. (Patient taking differently: Take 2 mg by mouth every other day.), Disp: 30 tablet, Rfl: 0   latanoprost (XALATAN) 0.005 % ophthalmic solution, Place 1 drop into both eyes at bedtime. , Disp: , Rfl:    levothyroxine (SYNTHROID) 75 MCG tablet, TAKE ONE (1) TABLET BY MOUTH EVERY DAY (Patient taking differently: Take 75 mcg by mouth daily.), Disp: 90 tablet, Rfl: 0   lidocaine-prilocaine (EMLA) cream, Apply 1 Application topically as needed. (Patient taking differently: Apply 1 Application topically as needed (ears).), Disp: 30 g, Rfl: 3   loratadine (CLARITIN) 10 MG tablet, Take 10 mg by mouth daily as needed for allergies., Disp: , Rfl:    metFORMIN (GLUCOPHAGE-XR) 500 MG 24 hr tablet, TAKE TWO (2) TABLETS BY MOUTH EVERY MORNING WITH BREAKFAST (Patient taking differently: Take 1,000 mg by mouth in the morning.), Disp: 180 tablet, Rfl: 1   nitroGLYCERIN (NITROSTAT) 0.4 MG SL tablet, Place 1 tablet (0.4 mg total) under the tongue every 5 (five) minutes as needed for chest pain., Disp: 25 tablet, Rfl: 11  ondansetron (ZOFRAN) 8 MG tablet, Take 1 tablet (8 mg total) by mouth every 8 (eight) hours as needed for nausea, vomiting or refractory nausea / vomiting. Start on the third day after carboplatin., Disp: 30 tablet, Rfl: 1   ONETOUCH DELICA LANCETS 38G MISC, USE TO CHECK BLOOD SUGAR ONCE DAILY, Disp: 100 each, Rfl: 1   ONETOUCH ULTRA test strip, USE 1 STRIP DAILY, Disp: 100 each, Rfl: 12   prochlorperazine (COMPAZINE) 10 MG tablet, Take 1 tablet (10 mg total) by mouth every 6 (six) hours as needed for nausea or vomiting., Disp: 30 tablet, Rfl: 1   tamsulosin (FLOMAX) 0.4 MG CAPS capsule, Take 1 capsule (0.4 mg total) by mouth daily., Disp: 90 capsule,  Rfl: 1   vitamin B-12 (CYANOCOBALAMIN) 1000 MCG tablet, Take 1,000 mcg by mouth daily., Disp: , Rfl:   Allergies:  Allergies  Allergen Reactions   Adhesive [Tape] Rash   Latex Rash   Other Hives, Swelling and Rash    Plastic shopping bags Metal staples: rash, swelling    Past Medical History, Surgical history, Social history, and Family History were reviewed and updated.  Review of Systems: Review of Systems  Constitutional: Negative.   HENT:  Negative.    Eyes: Negative.   Respiratory: Negative.    Cardiovascular: Negative.   Gastrointestinal: Negative.   Endocrine: Negative.   Genitourinary: Negative.    Musculoskeletal: Negative.   Skin: Negative.   Neurological: Negative.   Hematological: Negative.   Psychiatric/Behavioral: Negative.      Physical Exam:  height is 6' (1.829 m) and weight is 159 lb (72.1 kg). His oral temperature is 98.5 F (36.9 C). His blood pressure is 89/58 (abnormal) and his pulse is 107 (abnormal). His respiration is 18 and oxygen saturation is 100%.   Wt Readings from Last 3 Encounters:  05/03/22 159 lb (72.1 kg)  04/26/22 175 lb 14.8 oz (79.8 kg)  04/23/22 176 lb (79.8 kg)    Physical Exam Vitals reviewed.  HENT:     Head: Normocephalic and atraumatic.  Eyes:     Pupils: Pupils are equal, round, and reactive to light.  Cardiovascular:     Rate and Rhythm: Normal rate and regular rhythm.     Heart sounds: Normal heart sounds.  Pulmonary:     Effort: Pulmonary effort is normal.     Breath sounds: Normal breath sounds.  Abdominal:     General: Bowel sounds are normal.     Palpations: Abdomen is soft.  Musculoskeletal:        General: No tenderness or deformity. Normal range of motion.     Cervical back: Normal range of motion.  Lymphadenopathy:     Cervical: No cervical adenopathy.  Skin:    General: Skin is warm and dry.     Findings: No erythema or rash.  Neurological:     Mental Status: He is alert and oriented to person,  place, and time.  Psychiatric:        Behavior: Behavior normal.        Thought Content: Thought content normal.        Judgment: Judgment normal.     Lab Results  Component Value Date   WBC 10.3 05/03/2022   HGB 11.3 (L) 05/03/2022   HCT 33.7 (L) 05/03/2022   MCV 92.6 05/03/2022   PLT 313 05/03/2022     Chemistry      Component Value Date/Time   NA 134 (L) 05/03/2022 0935   NA 139 02/04/2022  0921   K 4.2 05/03/2022 0935   CL 102 05/03/2022 0935   CO2 24 05/03/2022 0935   BUN 36 (H) 05/03/2022 0935   BUN 21 02/04/2022 0921   CREATININE 0.86 05/03/2022 0935   CREATININE 1.16 (H) 04/03/2020 1044      Component Value Date/Time   CALCIUM 9.5 05/03/2022 0935   ALKPHOS 66 05/03/2022 0935   AST 51 (H) 05/03/2022 0935   ALT 48 (H) 05/03/2022 0935   BILITOT 0.7 05/03/2022 0935      Impression and Plan: Harry Brown is a very nice 81 year old white male.  He has extensive stage small cell lung cancer.  However, the extensive stage is only with brain metastasis.  The bulk of his disease is still in his chest.  As such, I still think that he probably would be a good candidate for systemic therapy along with radiation therapy to the chest.    I am glad that there was nothing that had to be done about the CNS disease or the bleed.  Again he is off blood thinner.  I will have to let cardiology decide when to get him back on Eliquis.  I still think we need to do treatment today.  We probably will start treatment on him tomorrow.  I will get a CT of his right hip to see if that shows anything with respect to the pain.  Ultimately, I suspect we probably will have to get him to have orthopedic Surgery to see if they can help.  He might need an injection into this area.  I know a lot has gone on for Harry Brown.  I really thought that we would be able to get his cancer better.  Again he has small cell lung cancer that only has metastasized to the brain.  We will start his radiation  tomorrow.  I will plan to get him back to see me in another 3 to 4 weeks.  After 2 cycles of treatment, we will then repeat his scan again and see how everything looks.    Volanda Napoleon, MD 11/27/202311:19 AM

## 2022-05-04 ENCOUNTER — Encounter: Payer: Self-pay | Admitting: *Deleted

## 2022-05-04 ENCOUNTER — Ambulatory Visit (HOSPITAL_BASED_OUTPATIENT_CLINIC_OR_DEPARTMENT_OTHER)
Admission: RE | Admit: 2022-05-04 | Discharge: 2022-05-04 | Disposition: A | Discharge status: Home / Self Care | Payer: Medicare Other | Source: Ambulatory Visit | Attending: Hematology & Oncology | Admitting: Hematology & Oncology

## 2022-05-04 ENCOUNTER — Inpatient Hospital Stay: Payer: Medicare Other

## 2022-05-04 ENCOUNTER — Telehealth: Payer: Self-pay

## 2022-05-04 ENCOUNTER — Other Ambulatory Visit: Payer: Self-pay

## 2022-05-04 ENCOUNTER — Encounter (HOSPITAL_BASED_OUTPATIENT_CLINIC_OR_DEPARTMENT_OTHER): Payer: Self-pay

## 2022-05-04 VITALS — BP 113/70 | HR 85 | Temp 98.0°F | Resp 18

## 2022-05-04 DIAGNOSIS — M25551 Pain in right hip: Secondary | ICD-10-CM | POA: Diagnosis not present

## 2022-05-04 DIAGNOSIS — C3491 Malignant neoplasm of unspecified part of right bronchus or lung: Secondary | ICD-10-CM | POA: Insufficient documentation

## 2022-05-04 DIAGNOSIS — Z5111 Encounter for antineoplastic chemotherapy: Secondary | ICD-10-CM | POA: Diagnosis not present

## 2022-05-04 DIAGNOSIS — Z79899 Other long term (current) drug therapy: Secondary | ICD-10-CM | POA: Diagnosis not present

## 2022-05-04 DIAGNOSIS — I4891 Unspecified atrial fibrillation: Secondary | ICD-10-CM | POA: Diagnosis not present

## 2022-05-04 DIAGNOSIS — C7931 Secondary malignant neoplasm of brain: Secondary | ICD-10-CM | POA: Diagnosis not present

## 2022-05-04 DIAGNOSIS — C3490 Malignant neoplasm of unspecified part of unspecified bronchus or lung: Secondary | ICD-10-CM | POA: Diagnosis not present

## 2022-05-04 DIAGNOSIS — R0602 Shortness of breath: Secondary | ICD-10-CM | POA: Diagnosis not present

## 2022-05-04 LAB — T4: T4, Total: 6.4 ug/dL (ref 4.5–12.0)

## 2022-05-04 MED ORDER — SODIUM CHLORIDE 0.9% FLUSH
10.0000 mL | INTRAVENOUS | Status: DC | PRN
  Administered 2022-05-04: 10 mL

## 2022-05-04 MED ORDER — SODIUM CHLORIDE 0.9 % IV SOLN
150.0000 mg | Freq: Once | INTRAVENOUS | Status: AC
  Administered 2022-05-04: 150 mg via INTRAVENOUS
  Filled 2022-05-04: qty 150

## 2022-05-04 MED ORDER — SODIUM CHLORIDE 0.9 % IV SOLN: Freq: Once | INTRAVENOUS | Status: AC

## 2022-05-04 MED ORDER — PALONOSETRON HCL INJECTION 0.25 MG/5ML
0.2500 mg | Freq: Once | INTRAVENOUS | Status: AC
  Administered 2022-05-04: 0.25 mg via INTRAVENOUS
  Filled 2022-05-04: qty 5

## 2022-05-04 MED ORDER — SODIUM CHLORIDE 0.9 % IV SOLN
1200.0000 mg | Freq: Once | INTRAVENOUS | Status: AC
  Administered 2022-05-04: 1200 mg via INTRAVENOUS
  Filled 2022-05-04: qty 20

## 2022-05-04 MED ORDER — SODIUM CHLORIDE 0.9 % IV SOLN
10.0000 mg | Freq: Once | INTRAVENOUS | Status: AC
  Administered 2022-05-04: 10 mg via INTRAVENOUS
  Filled 2022-05-04: qty 10

## 2022-05-04 MED ORDER — SODIUM CHLORIDE 0.9 % IV SOLN
450.0000 mg | Freq: Once | INTRAVENOUS | Status: AC
  Administered 2022-05-04: 450 mg via INTRAVENOUS
  Filled 2022-05-04: qty 45

## 2022-05-04 MED ORDER — HEPARIN SOD (PORK) LOCK FLUSH 100 UNIT/ML IV SOLN
500.0000 [IU] | Freq: Once | INTRAVENOUS | Status: AC | PRN
  Administered 2022-05-04: 500 [IU]

## 2022-05-04 MED ORDER — IOHEXOL 300 MG/ML  SOLN
100.0000 mL | Freq: Once | INTRAMUSCULAR | Status: AC | PRN
  Administered 2022-05-04: 100 mL via INTRAVENOUS

## 2022-05-04 MED ORDER — SODIUM CHLORIDE 0.9 % IV SOLN
80.0000 mg/m2 | Freq: Once | INTRAVENOUS | Status: AC
  Administered 2022-05-04: 160 mg via INTRAVENOUS
  Filled 2022-05-04: qty 8

## 2022-05-04 NOTE — Patient Instructions (Addendum)
[facility]  Discharge Instructions: Thank you for choosing [facility] to provide your oncology and hematology care.   If you have a lab appointment with the [facility], please go directly to the [facility] and check in at the registration area.  Wear comfortable clothing and clothing appropriate for easy access to any Portacath or PICC line.   We strive to give you quality time with your provider. You may need to reschedule your appointment if you arrive late (15 or more minutes).  Arriving late affects you and other patients whose appointments are after yours.  Also, if you miss three or more appointments without notifying the office, you may be dismissed from the clinic at the provider's discretion.      For prescription refill requests, have your pharmacy contact our office and allow 72 hours for refills to be completed.    Today you received the following chemotherapy and/or immunotherapy agents Tecentriq/Carboplatin/VP 16      To help prevent nausea and vomiting after your treatment, we encourage you to take your nausea medication as directed.  BELOW ARE SYMPTOMS THAT SHOULD BE REPORTED IMMEDIATELY: *FEVER GREATER THAN 100.4 F (38 C) OR HIGHER *CHILLS OR SWEATING *NAUSEA AND VOMITING THAT IS NOT CONTROLLED WITH YOUR NAUSEA MEDICATION *UNUSUAL SHORTNESS OF BREATH *UNUSUAL BRUISING OR BLEEDING *URINARY PROBLEMS (pain or burning when urinating, or frequent urination) *BOWEL PROBLEMS (unusual diarrhea, constipation, pain near the anus) TENDERNESS IN MOUTH AND THROAT WITH OR WITHOUT PRESENCE OF ULCERS (sore throat, sores in mouth, or a toothache) UNUSUAL RASH, SWELLING OR PAIN  UNUSUAL VAGINAL DISCHARGE OR ITCHING   Items with * indicate a potential emergency and should be followed up as soon as possible or go to the Emergency Department if any problems should occur.  Please show the CHEMOTHERAPY ALERT CARD or IMMUNOTHERAPY ALERT CARD at  check-in to the Emergency Department and triage nurse. Should you have questions after your visit or need to cancel or reschedule your appointment, please contact [facility]  [phone] and follow the prompts.  Office hours are 8:00 a.m. to 4:30 p.m. Monday - Friday. Please note that voicemails left after 4:00 p.m. may not be returned until the following business day.  We are closed weekends and major holidays. You have access to a nurse at all times for urgent questions. Please call the main number to the clinic [phone] and follow the prompts.  For any non-urgent questions, you may also contact your provider using MyChart. We now offer e-Visits for anyone 26 and older to request care online for non-urgent symptoms. For details visit [email or web address].   Also download the MyChart app! Go to the app store, search "MyChart", open the app, select [facility], and log in with your MyChart username and password.  Masks are optional in the cancer centers. If you would like for your care team to wear a mask while they are taking care of you, please let them know. You may have one support person who is at least 82 years old accompany you for your appointments.[facility]  Discharge Instructions: Thank you for choosing [facility] to provide your oncology and hematology care.   If you have a lab appointment with the [facility], please go directly to the [facility] and check in at the registration area.  Wear comfortable clothing and clothing appropriate for easy access to any Portacath or PICC  line.   We strive to give you quality time with your provider. You may need to reschedule your appointment if you arrive late (15 or more minutes).  Arriving late affects you and other patients whose appointments are after yours.  Also, if you miss three or more appointments without notifying the office, you may be dismissed from the  clinic at the provider's discretion.      For prescription refill requests, have your pharmacy contact our office and allow 72 hours for refills to be completed.    Today you received the following chemotherapy and/or immunotherapy agents tecentriq, vp-16 carboplatin    To help prevent nausea and vomiting after your treatment, we encourage you to take your nausea medication as directed.  BELOW ARE SYMPTOMS THAT SHOULD BE REPORTED IMMEDIATELY: *FEVER GREATER THAN 100.4 F (38 C) OR HIGHER *CHILLS OR SWEATING *NAUSEA AND VOMITING THAT IS NOT CONTROLLED WITH YOUR NAUSEA MEDICATION *UNUSUAL SHORTNESS OF BREATH *UNUSUAL BRUISING OR BLEEDING *URINARY PROBLEMS (pain or burning when urinating, or frequent urination) *BOWEL PROBLEMS (unusual diarrhea, constipation, pain near the anus) TENDERNESS IN MOUTH AND THROAT WITH OR WITHOUT PRESENCE OF ULCERS (sore throat, sores in mouth, or a toothache) UNUSUAL RASH, SWELLING OR PAIN  UNUSUAL VAGINAL DISCHARGE OR ITCHING   Items with * indicate a potential emergency and should be followed up as soon as possible or go to the Emergency Department if any problems should occur.  Please show the CHEMOTHERAPY ALERT CARD or IMMUNOTHERAPY ALERT CARD at check-in to the Emergency Department and triage nurse. Should you have questions after your visit or need to cancel or reschedule your appointment, please contact [facility]  [phone] and follow the prompts.  Office hours are 8:00 a.m. to 4:30 p.m. Monday - Friday. Please note that voicemails left after 4:00 p.m. may not be returned until the following business day.  We are closed weekends and major holidays. You have access to a nurse at all times for urgent questions. Please call the main number to the clinic [phone] and follow the prompts.  For any non-urgent questions, you may also contact your provider using MyChart. We now offer e-Visits for anyone 32 and older to request  care online for non-urgent symptoms. For details visit [email or web address].   Also download the MyChart app! Go to the app store, search "MyChart", open the app, select , and log in with your MyChart username and password.  Masks are optional in the cancer centers. If you would like for your care team to wear a mask while they are taking care of you, please let them know. You may have one support person who is at least 82 years old accompany you for your appointments.

## 2022-05-04 NOTE — Telephone Encounter (Signed)
Pt informed per MD, scan showed no signs of cancer or fractures, appears to be arthritis and if it is causing him to much pain we can refer him to orthopedic surgeon. Pt confirmed, denied referral for orthopedic as of right now.

## 2022-05-04 NOTE — Telephone Encounter (Signed)
Spoke with wife, Bethena Roys.   Transition Care Management Follow-up Telephone Call Date of discharge and from where: 04/28/2022, [facility].  How have you been since you were released from the hospital? Bethena Roys states patient is getting stronger. Received his first chemo treatment today. Report not eating or sleeping well but Oncologist prescribed medications today.  Any questions or concerns? No  Items Reviewed: Did the pt receive and understand the discharge instructions provided? Yes  Medications obtained and verified? No . Several changes during and after hospitalization. Advised to take as directed by Oncologist.  Other? No  Any new allergies since your discharge? No  Dietary orders reviewed? Yes Do you have support at home? Yes . Wife and son taking care of patient. Offered CCM referral for [facility], declines at this time.   Home Care and Equipment/Supplies: Were home health services ordered? No. Offered CCM, declines at this time.  If so, what is the name of the agency? NA  Has the agency set up a time to come to the patient's home? not applicable Were any new equipment or medical supplies ordered?  No What is the name of the medical supply agency? NA Were you able to get the supplies/equipment? not applicable Do you have any questions related to the use of the equipment or supplies? No  Functional Questionnaire: (I = Independent and D = Dependent) ADLs: D  Bathing/Dressing- D  Meal Prep- D  Eating- D  Maintaining continence- D  Transferring/Ambulation- D  Managing Meds- D  Follow up appointments reviewed:  PCP Hospital f/u appt confirmed? No  Patient with several appts with Onc the next week, wife would like to wait to see if pt gets stronger. Encouraged to call for HFU appt.  [facility] f/u appt confirmed? Yes   Are transportation arrangements needed? No  If their condition worsens, is the pt aware to call PCP or go to the Emergency Dept.? Yes Was the patient provided with  contact information for the PCP's office or ED? Yes Was to pt encouraged to call back with questions or concerns? Yes

## 2022-05-04 NOTE — Progress Notes (Signed)
After a delay yesterday in starting his treatment, he is here today to begin chemotherapy. He had a CT prior to coming to the office which is negative for any acute/metastatic findings.   Oncology Nurse Navigator Documentation     05/04/2022   11:00 AM  Oncology Nurse Navigator Flowsheets  Phase of Treatment Chemo  Chemotherapy Actual Start Date: 05/04/2022  Chemotherapy Expected End Date: 09/28/2022  Navigator Follow Up Date: 05/25/2022  Navigator Follow Up Reason: Follow-up Appointment;Chemotherapy  Navigator Location [facility]  Navigator Encounter Type Treatment;Appt/Treatment Plan Review  Patient Visit Type MedOnc  Treatment Phase Active Tx;First Chemo Tx  Barriers/Navigation Needs Coordination of Care;Education  Interventions Psycho-Social Support  Acuity Level 2-Minimal Needs (1-2 Barriers Identified)  Education Method Verbal  Support Groups/Services Friends and Family  Time Spent with Patient 15

## 2022-05-05 ENCOUNTER — Other Ambulatory Visit: Payer: Medicare Other | Admitting: Licensed Clinical Social Worker

## 2022-05-05 ENCOUNTER — Inpatient Hospital Stay: Payer: Medicare Other

## 2022-05-05 ENCOUNTER — Other Ambulatory Visit: Payer: Self-pay

## 2022-05-05 ENCOUNTER — Inpatient Hospital Stay: Payer: Medicare Other | Admitting: Licensed Clinical Social Worker

## 2022-05-05 ENCOUNTER — Encounter: Payer: Self-pay | Admitting: *Deleted

## 2022-05-05 VITALS — BP 103/72 | HR 73 | Temp 97.7°F | Resp 18

## 2022-05-05 DIAGNOSIS — C3491 Malignant neoplasm of unspecified part of right bronchus or lung: Secondary | ICD-10-CM

## 2022-05-05 DIAGNOSIS — C7931 Secondary malignant neoplasm of brain: Secondary | ICD-10-CM | POA: Diagnosis not present

## 2022-05-05 DIAGNOSIS — Z79899 Other long term (current) drug therapy: Secondary | ICD-10-CM | POA: Diagnosis not present

## 2022-05-05 DIAGNOSIS — M25551 Pain in right hip: Secondary | ICD-10-CM | POA: Diagnosis not present

## 2022-05-05 DIAGNOSIS — I4891 Unspecified atrial fibrillation: Secondary | ICD-10-CM | POA: Diagnosis not present

## 2022-05-05 DIAGNOSIS — Z5111 Encounter for antineoplastic chemotherapy: Secondary | ICD-10-CM | POA: Diagnosis not present

## 2022-05-05 DIAGNOSIS — R0602 Shortness of breath: Secondary | ICD-10-CM | POA: Diagnosis not present

## 2022-05-05 DIAGNOSIS — C3490 Malignant neoplasm of unspecified part of unspecified bronchus or lung: Secondary | ICD-10-CM | POA: Diagnosis not present

## 2022-05-05 MED ORDER — SODIUM CHLORIDE 0.9 % IV SOLN: Freq: Once | INTRAVENOUS | Status: AC

## 2022-05-05 MED ORDER — SODIUM CHLORIDE 0.9 % IV SOLN
80.0000 mg/m2 | Freq: Once | INTRAVENOUS | Status: AC
  Administered 2022-05-05: 160 mg via INTRAVENOUS
  Filled 2022-05-05: qty 8

## 2022-05-05 MED ORDER — SODIUM CHLORIDE 0.9% FLUSH
10.0000 mL | INTRAVENOUS | Status: DC | PRN
  Administered 2022-05-05: 10 mL

## 2022-05-05 MED ORDER — HEPARIN SOD (PORK) LOCK FLUSH 100 UNIT/ML IV SOLN
500.0000 [IU] | Freq: Once | INTRAVENOUS | Status: AC | PRN
  Administered 2022-05-05: 500 [IU]

## 2022-05-05 MED ORDER — SODIUM CHLORIDE 0.9 % IV SOLN
10.0000 mg | Freq: Once | INTRAVENOUS | Status: AC
  Administered 2022-05-05: 10 mg via INTRAVENOUS
  Filled 2022-05-05: qty 10

## 2022-05-05 NOTE — Patient Instructions (Signed)
Etoposide Capsules What is this medication? ETOPOSIDE (e toe POE side) treats lung cancer. It works by slowing down the growth of cancer cells. This medicine may be used for other purposes; ask your health care provider or pharmacist if you have questions. COMMON BRAND NAME(S): VePesid What should I tell my care team before I take this medication? They need to know if you have any of these conditions: Infection Kidney disease Liver disease Low blood counts, such as low white cell, platelet, red cell counts An unusual or allergic reaction to etoposide, other medications, foods, dyes, or preservatives Pregnant or trying to get pregnant Breastfeeding How should I use this medication? Take this medication by mouth with a glass of water. Take it as directed on the prescription label. Do not cut, crush, or chew this medication. Swallow the capsules whole. Do not take it more often than directed. Keep taking it unless your care team tells you to stop. Handling this medication may be harmful. Wear gloves while touching this medication or bottle. Talk to your care team about how to handle this medication. Special instructions may apply. Talk to your care team about the use of this medication in children. Special care may be needed. Overdosage: If you think you have taken too much of this medicine contact a poison control center or emergency room at once. NOTE: This medicine is only for you. Do not share this medicine with others. What if I miss a dose? If you miss a dose, take it as soon as you can. If it is almost time for your next dose, take only that dose. Do not take double or extra doses. What may interact with this medication? Cyclosporine Warfarin This list may not describe all possible interactions. Give your health care provider a list of all the medicines, herbs, non-prescription drugs, or dietary supplements you use. Also tell them if you smoke, drink alcohol, or use illegal drugs. Some  items may interact with your medicine. What should I watch for while using this medication? Your condition will be monitored carefully while you are receiving this medication. This medication may make you feel generally unwell. This is not uncommon as chemotherapy can affect healthy cells as well as cancer cells. Report any side effects. Continue your course of treatment even though you feel ill unless your care team tells you to stop. This medication may increase your risk of getting an infection. Call your care team for advice if you get a fever, chills, sore throat, or other symptoms of a cold or flu. Do not treat yourself. Try to avoid being around people who are sick. This medication may increase your risk to bruise or bleed. Call your care team if you notice any unusual bleeding. Talk to your care team about your risk of cancer. You may be more at risk for certain types of cancers if you take this medication. Talk to your care team if you may be pregnant. Serious birth defects can occur if you take this medication during pregnancy and for 6 months after the last dose. You will need a negative pregnancy test before starting this medication. Contraception is recommended while taking this medication and for 6 months after the last dose. Your care team can help you find the option that works for you. If your partner can get pregnant, use a condom during sex while taking this medication and for 4 months after the last dose. Do not breastfeed while taking this medication. This medication may cause infertility. Talk  to your care team if you are concerned about your fertility. What side effects may I notice from receiving this medication? Side effects that you should report to your care team as soon as possible: Allergic reactions--skin rash, itching, hives, swelling of the face, lips, tongue, or throat Infection--fever, chills, cough, sore throat, wounds that don't heal, pain or trouble when passing  urine, general feeling of discomfort or being unwell Low red blood cell level--unusual weakness or fatigue, dizziness, headache, trouble breathing Unusual bruising or bleeding Side effects that usually do not require medical attention (report to your care team if they continue or are bothersome): Diarrhea Fatigue Hair loss Loss of appetite Nausea Pain, redness, or swelling with sores inside the mouth or throat Vomiting This list may not describe all possible side effects. Call your doctor for medical advice about side effects. You may report side effects to FDA at 1-800-FDA-1088. Where should I keep my medication? Keep out of the reach of children and pets. Store in a refrigerator between 2 and 8 degrees C (36 and 46 degrees F). Do not freeze. Get rid any unused medication after the expiration date. To get rid of medications that are no longer needed or have expired: Take the medication to a medication take-back program. Check with your pharmacy or law enforcement to find a location. If you cannot return the medication, ask your pharmacist or care team how to get rid of this medication safely. NOTE: This sheet is a summary. It may not cover all possible information. If you have questions about this medicine, talk to your doctor, pharmacist, or health care provider.  2023 Elsevier/Gold Standard (2021-10-14 00:00:00)

## 2022-05-05 NOTE — Progress Notes (Signed)
Received telephone call from [facility] indicating that Dronabinol 2.5 mg has been approved from 05/05/22-06/07/23.

## 2022-05-05 NOTE — Progress Notes (Signed)
[facility] CSW Progress Note  Holiday representative met with patient to assess needs.  Patient's son, Arty Baumgartner., was also present.  Discussed advance directives since they are not noted in patient's chart.  Patient stated he did have them and will check with his wife about their location.  CSW provided contact information in case they had any questions.  Provided support.  No other needs expressed.    Rodman Pickle Aalyah Mansouri, LCSW

## 2022-05-06 ENCOUNTER — Inpatient Hospital Stay: Payer: Medicare Other

## 2022-05-06 VITALS — BP 131/70 | HR 84 | Temp 97.5°F | Resp 17

## 2022-05-06 DIAGNOSIS — C3491 Malignant neoplasm of unspecified part of right bronchus or lung: Secondary | ICD-10-CM

## 2022-05-06 DIAGNOSIS — Z79899 Other long term (current) drug therapy: Secondary | ICD-10-CM | POA: Diagnosis not present

## 2022-05-06 DIAGNOSIS — R0602 Shortness of breath: Secondary | ICD-10-CM | POA: Diagnosis not present

## 2022-05-06 DIAGNOSIS — C3490 Malignant neoplasm of unspecified part of unspecified bronchus or lung: Secondary | ICD-10-CM | POA: Diagnosis not present

## 2022-05-06 DIAGNOSIS — Z5111 Encounter for antineoplastic chemotherapy: Secondary | ICD-10-CM | POA: Diagnosis not present

## 2022-05-06 DIAGNOSIS — M25551 Pain in right hip: Secondary | ICD-10-CM | POA: Diagnosis not present

## 2022-05-06 DIAGNOSIS — C7931 Secondary malignant neoplasm of brain: Secondary | ICD-10-CM | POA: Diagnosis not present

## 2022-05-06 DIAGNOSIS — I4891 Unspecified atrial fibrillation: Secondary | ICD-10-CM | POA: Diagnosis not present

## 2022-05-06 MED ORDER — SODIUM CHLORIDE 0.9 % IV SOLN: Freq: Once | INTRAVENOUS | Status: AC

## 2022-05-06 MED ORDER — HEPARIN SOD (PORK) LOCK FLUSH 100 UNIT/ML IV SOLN
500.0000 [IU] | Freq: Once | INTRAVENOUS | Status: AC | PRN
  Administered 2022-05-06: 500 [IU]

## 2022-05-06 MED ORDER — SODIUM CHLORIDE 0.9% FLUSH
10.0000 mL | INTRAVENOUS | Status: DC | PRN
  Administered 2022-05-06: 10 mL

## 2022-05-06 MED ORDER — SODIUM CHLORIDE 0.9 % IV SOLN
80.0000 mg/m2 | Freq: Once | INTRAVENOUS | Status: AC
  Administered 2022-05-06: 160 mg via INTRAVENOUS
  Filled 2022-05-06: qty 2

## 2022-05-06 MED ORDER — SODIUM CHLORIDE 0.9 % IV SOLN
10.0000 mg | Freq: Once | INTRAVENOUS | Status: AC
  Administered 2022-05-06: 10 mg via INTRAVENOUS
  Filled 2022-05-06: qty 10

## 2022-05-06 NOTE — Patient Instructions (Signed)
[facility] [place]  Discharge Instructions: Thank you for choosing [facility] to provide your oncology and hematology care.   If you have a lab appointment with the [facility], please go directly to the [facility] and check in at the registration area.  Wear comfortable clothing and clothing appropriate for easy access to any Portacath or PICC line.   We strive to give you quality time with your provider. You may need to reschedule your appointment if you arrive late (15 or more minutes).  Arriving late affects you and other patients whose appointments are after yours.  Also, if you miss three or more appointments without notifying the office, you may be dismissed from the clinic at the provider's discretion.      For prescription refill requests, have your pharmacy contact our office and allow 72 hours for refills to be completed.    Today you received the following chemotherapy and/or immunotherapy agents VP16      To help prevent nausea and vomiting after your treatment, we encourage you to take your nausea medication as directed.  BELOW ARE SYMPTOMS THAT SHOULD BE REPORTED IMMEDIATELY: *FEVER GREATER THAN 100.4 F (38 C) OR HIGHER *CHILLS OR SWEATING *NAUSEA AND VOMITING THAT IS NOT CONTROLLED WITH YOUR NAUSEA MEDICATION *UNUSUAL SHORTNESS OF BREATH *UNUSUAL BRUISING OR BLEEDING *URINARY PROBLEMS (pain or burning when urinating, or frequent urination) *BOWEL PROBLEMS (unusual diarrhea, constipation, pain near the anus) TENDERNESS IN MOUTH AND THROAT WITH OR WITHOUT PRESENCE OF ULCERS (sore throat, sores in mouth, or a toothache) UNUSUAL RASH, SWELLING OR PAIN  UNUSUAL VAGINAL DISCHARGE OR ITCHING   Items with * indicate a potential emergency and should be followed up as soon as possible or go to the Emergency Department if any problems should occur.  Please show the CHEMOTHERAPY ALERT CARD or IMMUNOTHERAPY ALERT CARD at check-in to the  Emergency Department and triage nurse. Should you have questions after your visit or need to cancel or reschedule your appointment, please contact [facility]  [phone] and follow the prompts.  Office hours are 8:00 a.m. to 4:30 p.m. Monday - Friday. Please note that voicemails left after 4:00 p.m. may not be returned until the following business day.  We are closed weekends and major holidays. You have access to a nurse at all times for urgent questions. Please call the main number to the clinic [phone] and follow the prompts.  For any non-urgent questions, you may also contact your provider using MyChart. We now offer e-Visits for anyone 42 and older to request care online for non-urgent symptoms. For details visit [email or web address].   Also download the MyChart app! Go to the app store, search "MyChart", open the app, select [facility], and log in with your MyChart username and password.  Masks are optional in the cancer centers. If you would like for your care team to wear a mask while they are taking care of you, please let them know. You may have one support person who is at least 82 years old accompany you for your appointments.

## 2022-05-07 ENCOUNTER — Inpatient Hospital Stay: Payer: Medicare Other | Attending: Hematology & Oncology

## 2022-05-07 ENCOUNTER — Other Ambulatory Visit: Payer: Self-pay | Admitting: *Deleted

## 2022-05-07 VITALS — BP 121/89 | HR 89 | Temp 98.2°F | Resp 19

## 2022-05-07 DIAGNOSIS — C7931 Secondary malignant neoplasm of brain: Secondary | ICD-10-CM | POA: Insufficient documentation

## 2022-05-07 DIAGNOSIS — E860 Dehydration: Secondary | ICD-10-CM | POA: Insufficient documentation

## 2022-05-07 DIAGNOSIS — Z79899 Other long term (current) drug therapy: Secondary | ICD-10-CM | POA: Diagnosis not present

## 2022-05-07 DIAGNOSIS — Z923 Personal history of irradiation: Secondary | ICD-10-CM | POA: Diagnosis not present

## 2022-05-07 DIAGNOSIS — C3490 Malignant neoplasm of unspecified part of unspecified bronchus or lung: Secondary | ICD-10-CM | POA: Insufficient documentation

## 2022-05-07 DIAGNOSIS — Z5111 Encounter for antineoplastic chemotherapy: Secondary | ICD-10-CM | POA: Diagnosis not present

## 2022-05-07 DIAGNOSIS — C3491 Malignant neoplasm of unspecified part of right bronchus or lung: Secondary | ICD-10-CM

## 2022-05-07 MED ORDER — DEXAMETHASONE 4 MG PO TABS: ORAL_TABLET | ORAL | 0 refills | Status: AC

## 2022-05-07 MED ORDER — PEGFILGRASTIM INJECTION 6 MG/0.6ML ~~LOC~~
6.0000 mg | PREFILLED_SYRINGE | Freq: Once | SUBCUTANEOUS | Status: AC
  Administered 2022-05-07: 6 mg via SUBCUTANEOUS
  Filled 2022-05-07: qty 0.6

## 2022-05-07 NOTE — Patient Instructions (Signed)

## 2022-05-08 ENCOUNTER — Other Ambulatory Visit: Payer: Self-pay

## 2022-05-10 ENCOUNTER — Other Ambulatory Visit: Payer: Self-pay | Admitting: Family Medicine

## 2022-05-10 ENCOUNTER — Encounter: Payer: Self-pay | Admitting: Hematology & Oncology

## 2022-05-11 ENCOUNTER — Other Ambulatory Visit (HOSPITAL_COMMUNITY): Payer: Self-pay | Admitting: Cardiology

## 2022-05-11 ENCOUNTER — Encounter: Payer: Self-pay | Admitting: Radiation Oncology

## 2022-05-11 DIAGNOSIS — I7143 Infrarenal abdominal aortic aneurysm, without rupture: Secondary | ICD-10-CM

## 2022-05-12 NOTE — Progress Notes (Signed)
Radiation Oncology         [phone]) [phone] ________________________________  Name: Harry Angst Sr. MRN: [id number]  Date: 05/13/2022  DOB: [birth year]  Follow-Up Visit Note  CC: [name], MD  Volanda Napoleon, MD  No diagnosis found.  Diagnosis: The primary encounter diagnosis was Metastasis to brain [facility]). A diagnosis of Lung cancer metastatic to brain [facility]) was also pertinent to this visit.   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  The patient tolerated radiation therapy relatively well. On the date of his final treatment, the patient reported not sleeping well at night and auditory changes. He denied any other symptoms or concerns. He was also started on a 2 week Decadron taper on the date of his final treatment.   In the interval since he completed radiation therapy, the patient presented to the ED on 04/26/22 with acute onset mental status changes and was admitted for further work-up. CT of the head performed revealed a new small focus of hemorrhage involving the medial left parietal vertex corresponding to a previously seen contrast enhancing metastasis, located directly posterior to the medial central sulcus, measuring 1.5 x 1.4 x 1.2 cm. CT otherwise showed no associated mass effect or midline shift. MRI of the brain performed on 04/27/22 prior to discharge showed interval improvement of the hemorrhagic metastasis in the right subinsular white matter and left medial parietal lobe. However this study was incomplete given that the patient opted out of the study prematurely.  Given  resolution of altered mentation without hospital intervention, the patient was discharged home with instructions to follow-up with Dr. Marin Olp as scheduled.   Accordingly, the patient followed up with Dr. Marin Olp on 05/03/22. During which time, the patient denied any further episodes of confusion. He did endorse persistent hip pain due to arthritic changes (per x-ray) and mild SOB. Chest xray performed on the day of this visit did not show any pneumonia, and Dr. Marin Olp suspects that is SOB is correlated to his mediastinal disease. In terms of further treatment to his lung disease, the patient consented to starting chemotherapy consisting of Carboplatin/VP-16/Tecentriq on 05/04/22.   -- Of note: chest x-ray performed on 05/03/22 also redemonstrated the known lung carcinoma with right paratracheal mediastinal thickening consistent with metastatic lymphadenopathy, right hilar enlargement consistent the primary mass or adenopathy, and right mid to lower lung nodules which showed a slight increase in size when compared to 02/18/22 CXR.  To better evaluate his hip pain, Dr. Marin Olp ordered a CT of the right hip on 05/04/22 which demonstrated mild age related degenerative changes involving the right hip, and no evidence of fracture or metastatic bone lesions.     ***                      Allergies:  is allergic to ativan [lorazepam], adhesive [tape], latex, and other.  Meds: Current Outpatient Medications  Medication Sig Dispense Refill   atenolol (TENORMIN) 25 MG tablet TAKE ONE (1) TABLET BY MOUTH EVERY DAY (Patient taking differently: Take 25 mg by mouth daily.) 90 tablet 0   atorvastatin (LIPITOR) 40 MG  tablet TAKE ONE (1) TABLET BY MOUTH EACH DAY AT6PM 90 tablet 1   Cholecalciferol (VITAMIN D) 50 MCG (2000 UT) tablet Take 2,000 Units by mouth daily.      dexamethasone (DECADRON) 4 MG tablet Take 2 tabs by mouth starting day after last dose of etoposide for one day only. Repeat every 21 days. Take  with food. 8 tablet 0   dronabinol (MARINOL) 2.5 MG capsule Take 1 capsule (2.5 mg total) by mouth 2 (two) times daily before lunch and supper. 60 capsule 0   latanoprost (XALATAN) 0.005 % ophthalmic solution Place 1 drop into both eyes at bedtime.      levothyroxine (SYNTHROID) 75 MCG tablet TAKE ONE (1) TABLET BY MOUTH EVERY DAY (Patient taking differently: Take 75 mcg by mouth daily.) 90 tablet 0   lidocaine-prilocaine (EMLA) cream Apply 1 Application topically as needed. (Patient taking differently: Apply 1 Application topically as needed (ears).) 30 g 3   loratadine (CLARITIN) 10 MG tablet Take 10 mg by mouth daily as needed for allergies.     metFORMIN (GLUCOPHAGE-XR) 500 MG 24 hr tablet TAKE TWO (2) TABLETS BY MOUTH EVERY MORNING WITH BREAKFAST (Patient taking differently: Take 1,000 mg by mouth in the morning.) 180 tablet 1   nitroGLYCERIN (NITROSTAT) 0.4 MG SL tablet Place 1 tablet (0.4 mg total) under the tongue every 5 (five) minutes as needed for chest pain. 25 tablet 11   ondansetron (ZOFRAN) 8 MG tablet Take 1 tablet (8 mg total) by mouth every 8 (eight) hours as needed for nausea, vomiting or refractory nausea / vomiting. Start on the third day after carboplatin. 30 tablet 1   ONETOUCH DELICA LANCETS 99B MISC USE TO CHECK BLOOD SUGAR ONCE DAILY 100 each 1   ONETOUCH ULTRA test strip USE 1 STRIP DAILY 100 each 12   prochlorperazine (COMPAZINE) 10 MG tablet Take 1 tablet (10 mg total) by mouth every 6 (six) hours as needed for nausea or vomiting. 30 tablet 1   tamsulosin (FLOMAX) 0.4 MG CAPS capsule Take 1 capsule (0.4 mg total) by mouth daily. 90 capsule 1   vitamin B-12 (CYANOCOBALAMIN) 1000 MCG tablet Take 1,000 mcg by mouth daily.     No current facility-administered medications for this encounter.    Physical Findings: The patient is in no acute distress. Patient is alert and oriented.  vitals were not taken for this visit. .  No significant changes. Lungs are clear to  auscultation bilaterally. Heart has regular rate and rhythm. No palpable cervical, supraclavicular, or axillary adenopathy. Abdomen soft, non-tender, normal bowel sounds.   Lab Findings: Lab Results  Component Value Date   WBC 10.3 05/03/2022   HGB 11.3 (L) 05/03/2022   HCT 33.7 (L) 05/03/2022   MCV 92.6 05/03/2022   PLT 313 05/03/2022    Radiographic Findings: CT HIP RIGHT W CONTRAST  Result Date: 05/04/2022 CLINICAL DATA:  Chronic right hip pain history of small cell lung cancer. EXAM: CT OF THE LOWER RIGHT EXTREMITY WITH CONTRAST TECHNIQUE: Multidetector CT imaging of the lower right extremity was performed according to the standard protocol following intravenous contrast administration. RADIATION DOSE REDUCTION: This exam was performed according to the departmental dose-optimization program which includes automated exposure control, adjustment of the mA and/or kV according to patient size and/or use of iterative reconstruction technique. CONTRAST:  139mL OMNIPAQUE IOHEXOL 300 MG/ML  SOLN COMPARISON:  Radiographs 05/03/2022 FINDINGS: The right hip is normally located. Mild age related degenerative changes with mild joint space narrowing, early osteophytic  spurring and subchondral cystic change. Os acetabuli noted. No stress fracture or AVN. No destructive bone lesions to suggest metastatic disease. The visualized right hemipelvic bony structures are intact. No obvious hip joint effusion. The surrounding hip and pelvic musculature appear grossly normal by CT. No obvious muscle tear or intramuscular hematoma. No significant right-sided intrapelvic abnormalities are identified. There is a large amount of stool in the rectum which could suggest fecal impaction. Advanced atherosclerotic calcifications involving the right external iliac artery and common femoral artery and branch vessels. IMPRESSION: 1. Mild age related degenerative changes involving the right hip. 2. No CT findings for stress  fracture, AVN or metastatic bone lesions. 3. Large amount of stool in the rectum could suggest fecal impaction. 4. Advanced atherosclerotic calcifications. Electronically Signed   By: Marijo Sanes M.D.   On: 05/04/2022 10:10   DG HIP UNILAT WITH PELVIS 2-3 VIEWS RIGHT  Result Date: 05/03/2022 CLINICAL DATA:  Right hip pain when standing. History of lung carcinoma. EXAM: DG HIP (WITH OR WITHOUT PELVIS) 2-3V RIGHT COMPARISON:  None Available. FINDINGS: No fracture.  No bone lesion. Mild superolateral hip joint space narrowing, slightly greater on the right. No other degenerative changes. SI joints and pubic symphysis are normally spaced and aligned. Dense iliac and femoral artery vascular calcifications. Soft tissues otherwise unremarkable. IMPRESSION: 1. No fracture or acute finding.  No bone lesion. 2. Mild degenerative changes of the hip joints, right greater than left. Electronically Signed   By: Lajean Manes M.D.   On: 05/03/2022 12:54   DG Chest 2 View  Result Date: 05/03/2022 CLINICAL DATA:  Cough and shortness of breath. Recent history of small cell lung carcinoma. EXAM: CHEST - 2 VIEW COMPARISON:  Chest radiographs, 02/18/2022 and 03/16/2022. Chest CT, 03/04/2022. FINDINGS: Cardiac silhouette is normal in size. Stable changes from prior CABG surgery. Right paratracheal mediastinal widening with enlargement of the right hilum consistent with the known malignancy. Normal left hilar contours. Small nodules in the right mid to lower lung larger/more apparent than they were on chest radiograph dated 02/18/2022. Remainder of the lungs is clear. No convincing pleural effusion.  No pneumothorax. Right anterior chest wall, internal jugular Port-A-Cath tip projects in the lower superior vena cava, new since the prior studies. Skeletal structures are intact. IMPRESSION: 1. No acute cardiopulmonary disease. 2. Findings of known lung carcinoma with right paratracheal mediastinal thickening consistent with  metastatic lymphadenopathy, right hilar enlargement consistent the primary mass or adenopathy, and right mid to lower lung nodules. Lung nodules appear larger than they did on a chest radiograph dated 02/18/2022. Electronically Signed   By: Lajean Manes M.D.   On: 05/03/2022 12:52   MR BRAIN WO CONTRAST  Result Date: 04/27/2022 CLINICAL DATA:  Metastatic disease to brain. EXAM: MRI HEAD WITHOUT CONTRAST TECHNIQUE: Multiplanar, multiecho pulse sequences of the brain and surrounding structures were obtained without intravenous contrast. COMPARISON:  MRI head   03/15/2022 FINDINGS: Brain: Incomplete study.  Patient terminated the study prematurely. Hemorrhagic mass in the left medial parietal lobe is smaller. Improvement in surrounding edema. Hemorrhagic lesion in the right subinsular white matter smaller compared to the prior study. This lesion shows hemorrhage on the prior study which likely accounts for the area of diffusion hyperintensity. Additional enhancing lesions were present on the prior MRI but not visualized today. Generalized atrophy. Chronic ischemic change in the white matter. 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 Negative for acute infarct. Vascular: Normal arterial flow voids  are Skull and upper cervical spine: No focal skeletal abnormality. Sinuses/Orbits: Mucosal edema paranasal sinuses. Bilateral cataract extraction Other: None IMPRESSION: Incomplete study.  Patient was unable to complete the study Interval improvement in hemorrhagic metastasis in the right subinsular white matter and left medial parietal lobe. Additional enhancing lesions are present on the prior MRI of 03/15/2022. Contrast not administered on today's study. Atrophy and chronic ischemic change. Electronically Signed   By: Franchot Gallo M.D.   On: 04/27/2022 12:51   CT HEAD WO CONTRAST (5MM)  Result Date: 04/26/2022 CLINICAL DATA:  Transient altered mental status, now resolved, metastatic  lung cancer EXAM: CT HEAD WITHOUT CONTRAST TECHNIQUE: Contiguous axial images were obtained from the base of the skull through the vertex without intravenous contrast. RADIATION DOSE REDUCTION: This exam was performed according to the departmental dose-optimization program which includes automated exposure control, adjustment of the mA and/or kV according to patient size and/or use of iterative reconstruction technique. COMPARISON:  MR brain, 03/15/2022 FINDINGS: Brain: Small focus of hemorrhage of the medial left parietal vertex, directly posterior to the medial central sulcus, measuring 1.5 x 1.4 x 1.2 cm (series 2, image 20, series 5, image 46). No evidence of acute infarction, hydrocephalus, extra-axial collection or directly visualized mass lesion/mass effect. Periventricular and deep white matter hypodensity. Vascular: No hyperdense vessel or unexpected calcification. Skull: Normal. Negative for fracture or focal lesion. Sinuses/Orbits: No acute finding. Other: None. IMPRESSION: 1. Small focus of hemorrhage of the medial left parietal vertex, directly posterior to the medial central sulcus, measuring 1.5 x 1.4 x 1.2 cm. This corresponds to a contrast enhancing metastasis demonstrated by recent prior MR. No evidence of mass effect or midline shift. 2. Small-vessel white matter disease. These results were called by telephone at the time of interpretation on 04/26/2022 at 3:14 pm to PA [facility] , who verbally acknowledged these results. Electronically Signed   By: Delanna Ahmadi M.D.   On: 04/26/2022 15:19   IR IMAGING GUIDED PORT INSERTION  Result Date: 04/19/2022 INDICATION: 81 year old male with metastatic small cell lung cancer. Port catheter needed to establish durable venous access. EXAM: IMPLANTED PORT A CATH PLACEMENT WITH ULTRASOUND AND FLUOROSCOPIC GUIDANCE MEDICATIONS: None. ANESTHESIA/SEDATION: Versed 1 mg IV; Fentanyl 50 mcg IV; Moderate Sedation Time:  16 minutes The patient's vital signs  and level of consciousness were continuously monitored during the procedure by the interventional radiology nurse under my direct supervision. FLUOROSCOPY: Radiation exposure index: 3 mGy reference air kerma COMPLICATIONS: None immediate. PROCEDURE: The right neck and chest was prepped with chlorhexidine, and draped in the usual sterile fashion using maximum barrier technique (cap and mask, sterile gown, sterile gloves, large sterile sheet, hand hygiene and cutaneous antiseptic). Local anesthesia was attained by infiltration with 1% lidocaine with epinephrine. Ultrasound demonstrated patency of the right internal jugular vein, and this was documented with an image. Under real-time ultrasound guidance, this vein was accessed with a 21 gauge micropuncture needle and image documentation was performed. A small dermatotomy was made at the access site with an 11 scalpel. A 0.018" wire was advanced into the SVC and the access needle exchanged for a 67F micropuncture vascular sheath. The 0.018" wire was then removed and a 0.035" wire advanced into the IVC. An appropriate location for the subcutaneous reservoir was selected below the clavicle and an incision was made through the skin and underlying soft tissues. The subcutaneous tissues were then dissected using a combination of blunt and sharp surgical technique and a pocket was formed. A  single lumen power injectable portacatheter was then tunneled through the subcutaneous tissues from the pocket to the dermatotomy and the port reservoir placed within the subcutaneous pocket. The venous access site was then serially dilated and a peel away vascular sheath placed over the wire. The wire was removed and the port catheter advanced into position under fluoroscopic guidance. The catheter tip is positioned in the superior cavoatrial junction. This was documented with a spot image. The portacatheter was then tested and found to flush and aspirate well. The port was flushed with  saline followed by 100 units/mL heparinized saline. The pocket was then closed in two layers using first subdermal inverted interrupted absorbable sutures followed by a running subcuticular suture. The epidermis was then sealed with Dermabond. The dermatotomy at the venous access site was also closed with Dermabond. IMPRESSION: Successful placement of a right IJ approach Power Port with ultrasound and fluoroscopic guidance. The catheter is ready for use. Electronically Signed   By: Jacqulynn Cadet M.D.   On: 04/19/2022 17:11    Impression: The primary encounter diagnosis was Metastasis to brain [facility]). A diagnosis of Lung cancer metastatic to brain [facility]) was also pertinent to this visit.   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  ***  Plan:  ***   *** minutes of total time was spent for this patient encounter, including preparation, face-to-face counseling with the patient and coordination of care, physical exam, and documentation of the encounter. ____________________________________  Blair Promise, PhD, MD  This document serves as a record of services personally performed by Gery Pray, MD. It was created on his behalf by Roney Mans, a trained medical scribe. The creation of this record is based on the scribe's personal observations and the provider's statements to them. This document has been checked and approved by the attending provider.

## 2022-05-12 NOTE — Progress Notes (Incomplete)
  Radiation Oncology         [phone] ________________________________  Patient Name: Harry Brown MRN: [id number] DOB: 1941-05-23 Referring Physician: Burney Gauze (Profile Not Attached) Date of Service: 04/13/2022 [facility], [place]                                                        End Of Treatment Note  Diagnoses: C79.31-Secondary malignant neoplasm of brain  Cancer Staging: The primary encounter diagnosis was Metastasis to brain [facility]). A diagnosis of Lung cancer metastatic to brain [facility]) was also pertinent to this visit.   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 On the date of his final treatment, the patient reported not sleeping well at night and auditory changes. He denied any other symptoms or concerns. He was also started on a 2 week Decadron taper on the date of his final treatment.   Plan: The patient will follow-up with radiation oncology in one month .  ________________________________________________ -----------------------------------  Blair Promise, PhD, MD  This document serves as a record of services personally performed by Gery Pray, MD. It was created on his behalf by Roney Mans, a trained medical scribe. The creation of this record is based on the scribe's personal observations and the provider's statements to them. This document has been checked and approved by the attending provider.

## 2022-05-13 ENCOUNTER — Encounter: Payer: Self-pay | Admitting: Radiation Oncology

## 2022-05-13 ENCOUNTER — Ambulatory Visit
Admission: RE | Admit: 2022-05-13 | Discharge: 2022-05-13 | Disposition: A | Discharge status: Home / Self Care | Payer: Medicare Other | Source: Ambulatory Visit | Attending: Radiation Oncology | Admitting: Radiation Oncology

## 2022-05-13 ENCOUNTER — Other Ambulatory Visit: Payer: Self-pay | Admitting: Radiation Therapy

## 2022-05-13 VITALS — BP 121/95 | HR 118 | Temp 96.2°F | Resp 18 | Ht 72.0 in | Wt 164.8 lb

## 2022-05-13 DIAGNOSIS — Z79899 Other long term (current) drug therapy: Secondary | ICD-10-CM | POA: Diagnosis not present

## 2022-05-13 DIAGNOSIS — Z7984 Long term (current) use of oral hypoglycemic drugs: Secondary | ICD-10-CM | POA: Diagnosis not present

## 2022-05-13 DIAGNOSIS — C778 Secondary and unspecified malignant neoplasm of lymph nodes of multiple regions: Secondary | ICD-10-CM | POA: Diagnosis not present

## 2022-05-13 DIAGNOSIS — R600 Localized edema: Secondary | ICD-10-CM | POA: Diagnosis not present

## 2022-05-13 DIAGNOSIS — C7931 Secondary malignant neoplasm of brain: Secondary | ICD-10-CM | POA: Insufficient documentation

## 2022-05-13 DIAGNOSIS — C3491 Malignant neoplasm of unspecified part of right bronchus or lung: Secondary | ICD-10-CM

## 2022-05-13 DIAGNOSIS — C3490 Malignant neoplasm of unspecified part of unspecified bronchus or lung: Secondary | ICD-10-CM

## 2022-05-13 DIAGNOSIS — Z923 Personal history of irradiation: Secondary | ICD-10-CM | POA: Insufficient documentation

## 2022-05-13 DIAGNOSIS — C3431 Malignant neoplasm of lower lobe, right bronchus or lung: Secondary | ICD-10-CM | POA: Diagnosis not present

## 2022-05-13 DIAGNOSIS — Z79890 Hormone replacement therapy (postmenopausal): Secondary | ICD-10-CM | POA: Insufficient documentation

## 2022-05-13 HISTORY — DX: Personal history of irradiation: Z92.3

## 2022-05-13 NOTE — Progress Notes (Signed)
[name] is here today for follow up post radiation to the brain.    They completed their radiation on: 04/13/22  Does the patient complain of any of the following: Headache: No Visual Changes: No Hearing Changes: No Nausea: No Vomiting: No Balance or coordination issues: Yes, difficult time with ambulation. Also reports increased weakness.  Memory issues: No  Is the patient currently on a Decadron regimen? :  Per order patient to take Decadron 8 mg after last dose of  etoposide then  q 21 days, however patient has not been taking. Requesting clarification on order.   Additional comments if applicable: Spouse reports increased shortness of breath and changes in patients voice.    BP (!) 121/95 (BP Location: Left Arm, Patient Position: Sitting)   Pulse (!) 118   Temp (!) 96.2 F (35.7 C) (Temporal)   Resp 18   Ht 6' (1.829 m)   Wt 164 lb 12.8 oz (74.8 kg)   SpO2 98%   BMI 22.35 kg/m

## 2022-05-14 ENCOUNTER — Other Ambulatory Visit: Payer: Self-pay | Admitting: Family Medicine

## 2022-05-14 ENCOUNTER — Other Ambulatory Visit: Payer: Self-pay

## 2022-05-16 ENCOUNTER — Other Ambulatory Visit: Payer: Self-pay

## 2022-05-17 ENCOUNTER — Ambulatory Visit: Payer: Self-pay | Admitting: Radiation Oncology

## 2022-05-18 ENCOUNTER — Ambulatory Visit: Payer: Medicare Other | Admitting: Dietician

## 2022-05-18 NOTE — Progress Notes (Signed)
Nutrition Follow-up:   Called patient on home/mobile# and he put it on speaker with wife Bethena Roys. When asked how he is feeling he stated "lousy".   He was assessed by IP RD during recent hospitalization and NFPE while sleeping noted depletions in orbital, temporal, buccal areas.   Still having concerns with appetite  even though he started taking Marinol 11/28. Spouse reports he is taking it twice daily.  She did most of the talking. She also states he's not drinking much because he doesn't like getting up all through night to urinate.  He reports that he thinks savory foods and snacks would be more appealing.  He has been drinking ONS (Ensure max 1-2 day). Spouse also reports bowel movements haven't been regular try to use MiraLax.  PO intake yesterday:  Oatmeal with milk sugar, butter Ensure Max 1-2 per day Skipped lunch Chicken leg, rice, biscuit (only couple of bites) Marinol started 11/28 taking twice a day in morning and afternoon.  Doesn't seem to be helping. Fluids: Water (not even half a bottle), a little decaf tea   Medications: Marinol started per spouse not seeing any improvement, Miralax    Labs: reviewed 05/03/22    Anthropometrics: Weight has fluctuated with overall loss of 18# (9.8%) past month   Height: 72" Weight:  05/13/22  164.7# 04/13/22  180.4# (at final radiation [place]) 04/07/22  182# 03/25/22  178# 02/04/22  190.3# 08/10/21 195.3# UBW: 190-195 BMI: 24.41     Estimated Energy Needs   Kcals: 2400-2800 Protein: 97-122 g Fluid: 3 L     NUTRITION DIAGNOSIS:  Inadequate PO intake calories r/t decreased portions and poor appetite AEB weight loss past 6 months and dietary recall of intake trends. Continued inadequate PO intake fluids and calories.  Per IP RD after NFPE while in hospital:  Severe malnutrition in context of acute illness/injury    INTERVENTION:   Discussing possible change to appetite stimulant with MD.   Encouraged setting alarm each hour drinking  high calorie beverage  (ONS, prune juice, milk, juices) 1 cup per hour up to 7 pm.   Encouraged increasing Ensure Max to 3 per day and using ice cream or fruits in shake to boost calories.  Encouraged soft moist viscous foods to increase PO intake (loves grits, egg salad, blended soups).  Offer recipes and resource but patient/wife declined.    MONITORING, EVALUATION, GOAL: weight trends, nutrition impact symptoms, PO intake, labs     Next Visit: Remote next week after chemo MD follow up   April Manson, RDN, LDN Registered Dietitian, [place] (Usual office hours: Tuesday-Thursday) Mobile: [phone]

## 2022-05-20 NOTE — Progress Notes (Signed)
He is on immunotherapy and checking the TSH is standard practice.  Laurey Arrow

## 2022-05-24 ENCOUNTER — Ambulatory Visit (HOSPITAL_COMMUNITY)
Admission: RE | Admit: 2022-05-24 | Discharge: 2022-05-24 | Disposition: A | Discharge status: Home / Self Care | Payer: Medicare Other | Source: Ambulatory Visit | Attending: Cardiology | Admitting: Cardiology

## 2022-05-24 DIAGNOSIS — I7143 Infrarenal abdominal aortic aneurysm, without rupture: Secondary | ICD-10-CM | POA: Insufficient documentation

## 2022-05-25 ENCOUNTER — Other Ambulatory Visit: Payer: Self-pay

## 2022-05-25 ENCOUNTER — Encounter: Payer: Self-pay | Admitting: *Deleted

## 2022-05-25 ENCOUNTER — Inpatient Hospital Stay: Payer: Medicare Other

## 2022-05-25 ENCOUNTER — Inpatient Hospital Stay (HOSPITAL_BASED_OUTPATIENT_CLINIC_OR_DEPARTMENT_OTHER): Payer: Medicare Other | Admitting: Hematology & Oncology

## 2022-05-25 ENCOUNTER — Encounter: Payer: Self-pay | Admitting: Hematology & Oncology

## 2022-05-25 VITALS — BP 105/60 | HR 89 | Temp 97.7°F | Resp 18 | Ht 72.0 in | Wt 164.0 lb

## 2022-05-25 VITALS — BP 116/66 | HR 94 | Resp 18

## 2022-05-25 DIAGNOSIS — C3491 Malignant neoplasm of unspecified part of right bronchus or lung: Secondary | ICD-10-CM

## 2022-05-25 DIAGNOSIS — Z923 Personal history of irradiation: Secondary | ICD-10-CM | POA: Diagnosis not present

## 2022-05-25 DIAGNOSIS — Z79899 Other long term (current) drug therapy: Secondary | ICD-10-CM | POA: Diagnosis not present

## 2022-05-25 DIAGNOSIS — C3490 Malignant neoplasm of unspecified part of unspecified bronchus or lung: Secondary | ICD-10-CM | POA: Diagnosis not present

## 2022-05-25 DIAGNOSIS — C7931 Secondary malignant neoplasm of brain: Secondary | ICD-10-CM | POA: Diagnosis not present

## 2022-05-25 DIAGNOSIS — E860 Dehydration: Secondary | ICD-10-CM | POA: Diagnosis not present

## 2022-05-25 DIAGNOSIS — Z5111 Encounter for antineoplastic chemotherapy: Secondary | ICD-10-CM | POA: Diagnosis not present

## 2022-05-25 LAB — CBC WITH DIFFERENTIAL (CANCER CENTER ONLY)
Abs Immature Granulocytes: 0.82 10*3/uL — ABNORMAL HIGH (ref 0.00–0.07)
Basophils Absolute: 0.1 10*3/uL (ref 0.0–0.1)
Basophils Relative: 1 %
Eosinophils Absolute: 0 10*3/uL (ref 0.0–0.5)
Eosinophils Relative: 0 %
HCT: 31.5 % — ABNORMAL LOW (ref 39.0–52.0)
Hemoglobin: 10.5 g/dL — ABNORMAL LOW (ref 13.0–17.0)
Immature Granulocytes: 6 %
Lymphocytes Relative: 9 %
Lymphs Abs: 1.2 10*3/uL (ref 0.7–4.0)
MCH: 30.5 pg (ref 26.0–34.0)
MCHC: 33.3 g/dL (ref 30.0–36.0)
MCV: 91.6 fL (ref 80.0–100.0)
Monocytes Absolute: 1.5 10*3/uL — ABNORMAL HIGH (ref 0.1–1.0)
Monocytes Relative: 12 %
Neutro Abs: 9.2 10*3/uL — ABNORMAL HIGH (ref 1.7–7.7)
Neutrophils Relative %: 72 %
Platelet Count: 311 10*3/uL (ref 150–400)
RBC: 3.44 MIL/uL — ABNORMAL LOW (ref 4.22–5.81)
RDW: 14 % (ref 11.5–15.5)
WBC Count: 12.7 10*3/uL — ABNORMAL HIGH (ref 4.0–10.5)
nRBC: 0 % (ref 0.0–0.2)

## 2022-05-25 LAB — CMP (CANCER CENTER ONLY)
ALT: 16 U/L (ref 0–44)
AST: 19 U/L (ref 15–41)
Albumin: 3.5 g/dL (ref 3.5–5.0)
Alkaline Phosphatase: 54 U/L (ref 38–126)
Anion gap: 8 (ref 5–15)
BUN: 34 mg/dL — ABNORMAL HIGH (ref 8–23)
CO2: 26 mmol/L (ref 22–32)
Calcium: 9 mg/dL (ref 8.9–10.3)
Chloride: 102 mmol/L (ref 98–111)
Creatinine: 0.93 mg/dL (ref 0.61–1.24)
GFR, Estimated: >60 mL/min (ref >60)
Glucose, Bld: 183 mg/dL — ABNORMAL HIGH (ref 70–99)
Potassium: 4.1 mmol/L (ref 3.5–5.1)
Sodium: 136 mmol/L (ref 135–145)
Total Bilirubin: 0.5 mg/dL (ref 0.3–1.2)
Total Protein: 6.2 g/dL — ABNORMAL LOW (ref 6.5–8.1)

## 2022-05-25 LAB — TSH: TSH: 2.55 u[IU]/mL (ref 0.350–4.500)

## 2022-05-25 LAB — LACTATE DEHYDROGENASE: LDH: 147 U/L (ref 98–192)

## 2022-05-25 MED ORDER — APIXABAN 2.5 MG PO TABS: 2.5000 mg | ORAL_TABLET | Freq: Two times a day (BID) | ORAL | 6 refills | Status: AC

## 2022-05-25 MED ORDER — SODIUM CHLORIDE 0.9% FLUSH
10.0000 mL | Freq: Once | INTRAVENOUS | Status: AC
  Administered 2022-05-25: 10 mL

## 2022-05-25 MED ORDER — SODIUM CHLORIDE 0.9% FLUSH
10.0000 mL | INTRAVENOUS | Status: DC | PRN
  Administered 2022-05-25: 10 mL via INTRAVENOUS

## 2022-05-25 MED ORDER — SODIUM CHLORIDE 0.9 % IV SOLN: INTRAVENOUS | Status: DC

## 2022-05-25 MED ORDER — SODIUM CHLORIDE 0.9 % IV SOLN: INTRAVENOUS | Status: AC

## 2022-05-25 MED ORDER — HEPARIN SOD (PORK) LOCK FLUSH 100 UNIT/ML IV SOLN
500.0000 [IU] | Freq: Once | INTRAVENOUS | Status: AC
  Administered 2022-05-25: 500 [IU] via INTRAVENOUS

## 2022-05-25 MED ORDER — SODIUM CHLORIDE 0.9 % IV SOLN
40.0000 mg | Freq: Once | INTRAVENOUS | Status: AC
  Administered 2022-05-25: 40 mg via INTRAVENOUS
  Filled 2022-05-25: qty 4

## 2022-05-25 MED ORDER — SODIUM CHLORIDE 0.9 % IV SOLN
20.0000 mg | Freq: Once | INTRAVENOUS | Status: AC
  Administered 2022-05-25: 20 mg via INTRAVENOUS
  Filled 2022-05-25: qty 20

## 2022-05-25 NOTE — Patient Instructions (Signed)

## 2022-05-25 NOTE — Patient Instructions (Signed)
Dehydration, Adult Dehydration is a condition in which there is not enough water or other fluids in the body. This happens when a person loses more fluids than he or she takes in. Important organs, such as the kidneys, brain, and heart, cannot function without a proper amount of fluids. Any loss of fluids from the body can lead to dehydration. Dehydration can be mild, moderate, or severe. It should be treated right away to prevent it from becoming severe. What are the causes? Dehydration may be caused by: Conditions that cause loss of water or other fluids, such as diarrhea, vomiting, or sweating or urinating a lot. Not drinking enough fluids, especially when you are ill or doing activities that require a lot of energy. Other illnesses and conditions, such as fever or infection. Certain medicines, such as medicines that remove excess fluid from the body (diuretics). Lack of safe drinking water. Not being able to get enough water and food. What increases the risk? The following factors may make you more likely to develop this condition: Having a long-term (chronic) illness that has not been treated properly, such as diabetes, heart disease, or kidney disease. Being 65 years of age or older. Having a disability. Living in a place that is high in altitude, where thinner, drier air causes more fluid loss. Doing exercises that put stress on your body for a long time (endurance sports). What are the signs or symptoms? Symptoms of dehydration depend on how severe it is. Mild or moderate dehydration Thirst. Dry lips or dry mouth. Dizziness or light-headedness, especially when standing up from a seated position. Muscle cramps. Dark urine. Urine may be the color of tea. Less urine or tears produced than usual. Headache. Severe dehydration Changes in skin. Your skin may be cold and clammy, blotchy, or pale. Your skin also may not return to normal after being lightly pinched and released. Little or  no tears, urine, or sweat. Changes in vital signs, such as rapid breathing and low blood pressure. Your pulse may be weak or may be faster than 100 beats a minute when you are sitting still. Other changes, such as: Feeling very thirsty. Sunken eyes. Cold hands and feet. Confusion. Being very tired (lethargic) or having trouble waking from sleep. Short-term weight loss. Loss of consciousness. How is this diagnosed? This condition is diagnosed based on your symptoms and a physical exam. You may have blood and urine tests to help confirm the diagnosis. How is this treated? Treatment for this condition depends on how severe it is. Treatment should be started right away. Do not wait until dehydration becomes severe. Severe dehydration is an emergency and needs to be treated in a hospital. Mild or moderate dehydration can be treated at home. You may be asked to: Drink more fluids. Drink an oral rehydration solution (ORS). This drink helps restore proper amounts of fluids and salts and minerals in the blood (electrolytes). Severe dehydration can be treated: With IV fluids. By correcting abnormal levels of electrolytes. This is often done by giving electrolytes through a tube that is passed through your nose and into your stomach (nasogastric tube, or NG tube). By treating the underlying cause of dehydration. Follow these instructions at home: Oral rehydration solution If told by your health care provider, drink an ORS: Make an ORS by following instructions on the package. Start by drinking small amounts, about  cup (120 mL) every 5-10 minutes. Slowly increase how much you drink until you have taken the amount recommended by your health   care provider. Eating and drinking        Drink enough clear fluid to keep your urine pale yellow. If you were told to drink an ORS, finish the ORS first and then start slowly drinking other clear fluids. Drink fluids such as: Water. Do not drink only  water. Doing that can lead to hyponatremia, which is having too little salt (sodium) in the body. Water from ice chips you suck on. Fruit juice that you have added water to (diluted fruit juice). Low-calorie sports drinks. Eat foods that contain a healthy balance of electrolytes, such as bananas, oranges, potatoes, tomatoes, and spinach. Do not drink alcohol. Avoid the following: Drinks that contain a lot of sugar. These include high-calorie sports drinks, fruit juice that is not diluted, and soda. Caffeine. Foods that are greasy or contain a lot of fat or sugar. General instructions Take over-the-counter and prescription medicines only as told by your health care provider. Do not take sodium tablets. Doing that can lead to having too much sodium in the body (hypernatremia). Return to your normal activities as told by your health care provider. Ask your health care provider what activities are safe for you. Keep all follow-up visits as told by your health care provider. This is important. Contact a health care provider if: You have muscle cramps, pain, or discomfort, such as: Pain in your abdomen and the pain gets worse or stays in one area (localizes). Stiff neck. You have a rash. You are more irritable than usual. You are sleepier or have a harder time waking than usual. You feel weak or dizzy. You feel very thirsty. Get help right away if you have: Any symptoms of severe dehydration. Symptoms of vomiting, such as: You cannot eat or drink without vomiting. Vomiting gets worse or does not go away. Vomit includes blood or green matter (bile). Symptoms that get worse with treatment. A fever. A severe headache. Problems with urination or bowel movements, such as: Diarrhea that gets worse or does not go away. Blood in your stool (feces). This may cause stool to look black and tarry. Not urinating, or urinating only a small amount of very dark urine, within 6-8 hours. Trouble  breathing. These symptoms may represent a serious problem that is an emergency. Do not wait to see if the symptoms will go away. Get medical help right away. Call your local emergency services (911 in the [place] Do not drive yourself to the hospital. Summary Dehydration is a condition in which there is not enough water or other fluids in the body. This happens when a person loses more fluids than he or she takes in. Treatment for this condition depends on how severe it is. Treatment should be started right away. Do not wait until dehydration becomes severe. Drink enough clear fluid to keep your urine pale yellow. If you were told to drink an oral rehydration solution (ORS), finish the ORS first and then start slowly drinking other clear fluids. Take over-the-counter and prescription medicines only as told by your health care provider. Get help right away if you have any symptoms of severe dehydration. This information is not intended to replace advice given to you by your health care provider. Make sure you discuss any questions you have with your health care provider. Document Revised: 09/30/2021 Document Reviewed: 01/04/2019 Elsevier Patient Education  2023 [employer]  

## 2022-05-25 NOTE — Progress Notes (Unsigned)
Patient is having a difficult time tolerating his treatment. He was dose reduced after his first cycle, but this reduction has not made a significant reduction in side effects. He will old treatment today and receive symptom management.   Oncology Nurse Navigator Documentation     05/25/2022    9:30 AM  Oncology Nurse Navigator Flowsheets  Navigator Follow Up Date: 06/01/2022  Navigator Follow Up Reason: Chemotherapy  Navigator Location [facility]  Navigator Encounter Type Appt/Treatment Plan Review  Patient Visit Type MedOnc  Treatment Phase Active Tx  Barriers/Navigation Needs Coordination of Care;Education  Interventions None Required  Acuity Level 2-Minimal Needs (1-2 Barriers Identified)  Support Groups/Services Friends and Family  Time Spent with Patient 15

## 2022-05-25 NOTE — Progress Notes (Signed)
Hematology and Oncology Follow Up Visit  Omaree Fuqua [id number] Aug 05, 1940 81 y.o. 05/25/2022   Principle Diagnosis:  Extensive stage small cell lung cancer-CNS metastasis  Current Therapy:   Cranial radiotherapy --completed on 04/13/2022 Carboplatin/VP-16/Tecentriq -- s/p cycle #1 -- start on 05/03/2022     Interim History:  Mr. Pultz is back for follow-up.  Unfortunately, he is not doing all that well.  He comes in a wheelchair.  He really had a tough time with the first cycle of treatment.  We have dose reduced treatment.  Even with that, he had a tough time.  He is not eating.  His wife says that he just has no energy.  He is quite weak.  He does look a little bit pale.  He has been off Eliquis because of the CNS bleed.  Has been off for several weeks.  We probably need to get him back on the Eliquis.  We probably need to get back on 2.5 mg p.o. twice daily.    He has not complained of any pain.  There is no cough or shortness of breath.  He does have palpitations from the atrial fibrillation.  There has been no issues with bowels or bladder.  He has had no diarrhea.  There is been no urinary frequency.  He was not complaining of much in the way of pain in the right hip.  We done x-rays in the past which have not shown any evidence of metastatic disease.  He has had no bleeding.  There is been no headache.  He has had no visual changes.  Overall, I would say his performance status is probably ECOG 3.    Medications:  Current Outpatient Medications:    atenolol (TENORMIN) 25 MG tablet, TAKE ONE (1) TABLET BY MOUTH EVERY DAY (Patient taking differently: Take 25 mg by mouth daily.), Disp: 90 tablet, Rfl: 0   atorvastatin (LIPITOR) 40 MG tablet, TAKE ONE (1) TABLET BY MOUTH EACH DAY AT6PM, Disp: 90 tablet, Rfl: 1   Cholecalciferol (VITAMIN D) 50 MCG (2000 UT) tablet, Take 2,000 Units by mouth daily. , Disp: , Rfl:    dexamethasone (DECADRON) 4 MG tablet, Take 2 tabs by  mouth starting day after last dose of etoposide for one day only. Repeat every 21 days. Take with food. (Patient not taking: Reported on 05/13/2022), Disp: 8 tablet, Rfl: 0   dronabinol (MARINOL) 2.5 MG capsule, Take 1 capsule (2.5 mg total) by mouth 2 (two) times daily before lunch and supper., Disp: 60 capsule, Rfl: 0   latanoprost (XALATAN) 0.005 % ophthalmic solution, Place 1 drop into both eyes at bedtime. , Disp: , Rfl:    levothyroxine (SYNTHROID) 75 MCG tablet, Take 1 tablet (75 mcg total) by mouth daily before breakfast., Disp: 90 tablet, Rfl: 0   lidocaine-prilocaine (EMLA) cream, Apply 1 Application topically as needed. (Patient taking differently: Apply 1 Application topically as needed (ears).), Disp: 30 g, Rfl: 3   loratadine (CLARITIN) 10 MG tablet, Take 10 mg by mouth daily as needed for allergies., Disp: , Rfl:    metFORMIN (GLUCOPHAGE-XR) 500 MG 24 hr tablet, TAKE TWO (2) TABLETS BY MOUTH EVERY MORNING WITH BREAKFAST (Patient taking differently: Take 1,000 mg by mouth in the morning.), Disp: 180 tablet, Rfl: 1   nitroGLYCERIN (NITROSTAT) 0.4 MG SL tablet, Place 1 tablet (0.4 mg total) under the tongue every 5 (five) minutes as needed for chest pain., Disp: 25 tablet, Rfl: 11   ondansetron (ZOFRAN) 8 MG  tablet, Take 1 tablet (8 mg total) by mouth every 8 (eight) hours as needed for nausea, vomiting or refractory nausea / vomiting. Start on the third day after carboplatin. (Patient not taking: Reported on 05/13/2022), Disp: 30 tablet, Rfl: 1   ONETOUCH DELICA LANCETS 62I MISC, USE TO CHECK BLOOD SUGAR ONCE DAILY, Disp: 100 each, Rfl: 1   ONETOUCH ULTRA test strip, USE 1 STRIP DAILY, Disp: 100 each, Rfl: 12   prochlorperazine (COMPAZINE) 10 MG tablet, Take 1 tablet (10 mg total) by mouth every 6 (six) hours as needed for nausea or vomiting. (Patient not taking: Reported on 05/13/2022), Disp: 30 tablet, Rfl: 1   tamsulosin (FLOMAX) 0.4 MG CAPS capsule, Take 1 capsule (0.4 mg total) by mouth  daily. (Patient not taking: Reported on 05/13/2022), Disp: 90 capsule, Rfl: 1   vitamin B-12 (CYANOCOBALAMIN) 1000 MCG tablet, Take 1,000 mcg by mouth daily., Disp: , Rfl:   Allergies:  Allergies  Allergen Reactions   Ativan [Lorazepam] Other (See Comments)    Proloneged lethargy   Adhesive [Tape] Rash   Latex Rash   Other Hives, Swelling and Rash    Plastic shopping bags Metal staples: rash, swelling    Past Medical History, Surgical history, Social history, and Family History were reviewed and updated.  Review of Systems: Review of Systems  Constitutional: Negative.   HENT:  Negative.    Eyes: Negative.   Respiratory: Negative.    Cardiovascular: Negative.   Gastrointestinal: Negative.   Endocrine: Negative.   Genitourinary: Negative.    Musculoskeletal: Negative.   Skin: Negative.   Neurological: Negative.   Hematological: Negative.   Psychiatric/Behavioral: Negative.      Physical Exam:  height is 6' (1.829 m) and weight is 164 lb (74.4 kg). His oral temperature is 97.7 F (36.5 C). His blood pressure is 105/60 and his pulse is 89. His respiration is 18 and oxygen saturation is 95%.   Wt Readings from Last 3 Encounters:  05/25/22 164 lb (74.4 kg)  05/13/22 164 lb 12.8 oz (74.8 kg)  05/03/22 159 lb (72.1 kg)    Physical Exam Vitals reviewed.  HENT:     Head: Normocephalic and atraumatic.  Eyes:     Pupils: Pupils are equal, round, and reactive to light.  Cardiovascular:     Rate and Rhythm: Normal rate and regular rhythm.     Heart sounds: Normal heart sounds.  Pulmonary:     Effort: Pulmonary effort is normal.     Breath sounds: Normal breath sounds.  Abdominal:     General: Bowel sounds are normal.     Palpations: Abdomen is soft.  Musculoskeletal:        General: No tenderness or deformity. Normal range of motion.     Cervical back: Normal range of motion.  Lymphadenopathy:     Cervical: No cervical adenopathy.  Skin:    General: Skin is warm and  dry.     Findings: No erythema or rash.  Neurological:     Mental Status: He is alert and oriented to person, place, and time.  Psychiatric:        Behavior: Behavior normal.        Thought Content: Thought content normal.        Judgment: Judgment normal.     Lab Results  Component Value Date   WBC 12.7 (H) 05/25/2022   HGB 10.5 (L) 05/25/2022   HCT 31.5 (L) 05/25/2022   MCV 91.6 05/25/2022   PLT 311 05/25/2022  Chemistry      Component Value Date/Time   NA 136 05/25/2022 0830   NA 139 02/04/2022 0921   K 4.1 05/25/2022 0830   CL 102 05/25/2022 0830   CO2 26 05/25/2022 0830   BUN 34 (H) 05/25/2022 0830   BUN 21 02/04/2022 0921   CREATININE 0.93 05/25/2022 0830   CREATININE 1.16 (H) 04/03/2020 1044      Component Value Date/Time   CALCIUM 9.0 05/25/2022 0830   ALKPHOS 54 05/25/2022 0830   AST 19 05/25/2022 0830   ALT 16 05/25/2022 0830   BILITOT 0.5 05/25/2022 0830      Impression and Plan: Mr. Vento is a very nice 81 year old white male.  He has extensive stage small cell lung cancer.  However, the extensive stage is only with brain metastasis.  I just hate the fact that he is not doing well right now.  Again, we gave him a dose reduction of his chemotherapy.  We clearly will have to make some more adjustments.  I do still think he get treated today.  He just looks pale and dehydrated.  I will give him some IV fluid.  I told him to increase the Marinol to 5 mg p.o. twice daily.  Maybe this will get his appetite going.  I just do not want to use Megace because of the risk of thromboembolic disease.  Again, we will hold his chemotherapy 1 week.  I still plan on rescan him after his second cycle to see how well he has responded.  I want him to come in tomorrow for lab work after he gets hydrated.  I worried that he might get quite anemic and will need to be transfused.  I know this is very complicated.  I just really thought that he would be feeling better.   However, I realize that given his maturity, chemotherapy concerning habits side effects.  Will plan to get him back in 1 week for hopefully treatment.    Volanda Napoleon, MD 12/19/20239:32 AM

## 2022-05-26 ENCOUNTER — Encounter: Payer: Self-pay | Admitting: Hematology & Oncology

## 2022-05-26 ENCOUNTER — Inpatient Hospital Stay: Payer: Medicare Other

## 2022-05-26 VITALS — BP 126/56 | HR 89 | Temp 98.5°F | Resp 18

## 2022-05-26 DIAGNOSIS — E860 Dehydration: Secondary | ICD-10-CM | POA: Diagnosis not present

## 2022-05-26 DIAGNOSIS — C7931 Secondary malignant neoplasm of brain: Secondary | ICD-10-CM | POA: Diagnosis not present

## 2022-05-26 DIAGNOSIS — Z79899 Other long term (current) drug therapy: Secondary | ICD-10-CM | POA: Diagnosis not present

## 2022-05-26 DIAGNOSIS — C3490 Malignant neoplasm of unspecified part of unspecified bronchus or lung: Secondary | ICD-10-CM | POA: Diagnosis not present

## 2022-05-26 DIAGNOSIS — Z5111 Encounter for antineoplastic chemotherapy: Secondary | ICD-10-CM | POA: Diagnosis not present

## 2022-05-26 DIAGNOSIS — Z923 Personal history of irradiation: Secondary | ICD-10-CM | POA: Diagnosis not present

## 2022-05-26 DIAGNOSIS — C3491 Malignant neoplasm of unspecified part of right bronchus or lung: Secondary | ICD-10-CM

## 2022-05-26 DIAGNOSIS — Z95828 Presence of other vascular implants and grafts: Secondary | ICD-10-CM

## 2022-05-26 LAB — CMP (CANCER CENTER ONLY)
ALT: 18 U/L (ref 0–44)
AST: 20 U/L (ref 15–41)
Albumin: 3.5 g/dL (ref 3.5–5.0)
Alkaline Phosphatase: 51 U/L (ref 38–126)
Anion gap: 11 (ref 5–15)
BUN: 37 mg/dL — ABNORMAL HIGH (ref 8–23)
CO2: 21 mmol/L — ABNORMAL LOW (ref 22–32)
Calcium: 8.8 mg/dL — ABNORMAL LOW (ref 8.9–10.3)
Chloride: 104 mmol/L (ref 98–111)
Creatinine: 0.89 mg/dL (ref 0.61–1.24)
GFR, Estimated: >60 mL/min (ref >60)
Glucose, Bld: 211 mg/dL — ABNORMAL HIGH (ref 70–99)
Potassium: 3.7 mmol/L (ref 3.5–5.1)
Sodium: 136 mmol/L (ref 135–145)
Total Bilirubin: 0.4 mg/dL (ref 0.3–1.2)
Total Protein: 6 g/dL — ABNORMAL LOW (ref 6.5–8.1)

## 2022-05-26 LAB — CBC WITH DIFFERENTIAL (CANCER CENTER ONLY)
Abs Immature Granulocytes: 0.55 10*3/uL — ABNORMAL HIGH (ref 0.00–0.07)
Basophils Absolute: 0 10*3/uL (ref 0.0–0.1)
Basophils Relative: 0 %
Eosinophils Absolute: 0 10*3/uL (ref 0.0–0.5)
Eosinophils Relative: 0 %
HCT: 28.5 % — ABNORMAL LOW (ref 39.0–52.0)
Hemoglobin: 9.6 g/dL — ABNORMAL LOW (ref 13.0–17.0)
Immature Granulocytes: 3 %
Lymphocytes Relative: 8 %
Lymphs Abs: 1.3 10*3/uL (ref 0.7–4.0)
MCH: 31 pg (ref 26.0–34.0)
MCHC: 33.7 g/dL (ref 30.0–36.0)
MCV: 91.9 fL (ref 80.0–100.0)
Monocytes Absolute: 0.8 10*3/uL (ref 0.1–1.0)
Monocytes Relative: 5 %
Neutro Abs: 13.9 10*3/uL — ABNORMAL HIGH (ref 1.7–7.7)
Neutrophils Relative %: 84 %
Platelet Count: 332 10*3/uL (ref 150–400)
RBC: 3.1 MIL/uL — ABNORMAL LOW (ref 4.22–5.81)
RDW: 14.2 % (ref 11.5–15.5)
WBC Count: 16.6 10*3/uL — ABNORMAL HIGH (ref 4.0–10.5)
nRBC: 0 % (ref 0.0–0.2)

## 2022-05-26 LAB — SAMPLE TO BLOOD BANK

## 2022-05-26 MED ORDER — SODIUM CHLORIDE 0.9% FLUSH
10.0000 mL | INTRAVENOUS | Status: DC | PRN
  Administered 2022-05-26: 10 mL via INTRAVENOUS

## 2022-05-26 MED ORDER — HEPARIN SOD (PORK) LOCK FLUSH 100 UNIT/ML IV SOLN
500.0000 [IU] | Freq: Once | INTRAVENOUS | Status: AC
  Administered 2022-05-26: 500 [IU] via INTRAVENOUS

## 2022-05-26 NOTE — Patient Instructions (Signed)

## 2022-05-27 ENCOUNTER — Ambulatory Visit: Payer: Medicare Other | Admitting: Dietician

## 2022-05-27 ENCOUNTER — Inpatient Hospital Stay: Payer: Medicare Other

## 2022-05-27 NOTE — Progress Notes (Signed)
Nutrition Follow-up:   Called patient on home/mobile# and he put it on speaker with wife Bethena Roys. When asked how he is feeling he reported feeling better since getting some IVF.  Spouse states he came home asked for sandwich, had burger for dinner homemade vegetable soup.  He's not drinking much, has lots of options (juices, puddings, yogurts in home).     Spouse reports the timer and hourly force of fluids just seemed to aggravate him.  They have several people coming for Christmas encouraged spouse to have family do the encouraging and give her a break. Chemo was this week, patient and spouse are motivated to complete treatments.  He is willing to try eggnog, spouse makes homemade nonalcoholic recipe.   Spouse also reports patient fears regaining weight.   Labs: reviewed 05/26/22     Anthropometrics: Weight stable past month   Height: 72" Weight:  05/25/22  164# 05/13/22  164.7# 04/13/22  180.4# (at final radiation [facility])  UBW: 190-195 BMI: 24.24     Estimated Energy Needs   Kcals: 2400-2800 Protein: 97-122 g Fluid: 3 L     NUTRITION DIAGNOSIS:  Inadequate PO intake calories r/t decreased portions and poor appetite AEB weight loss past 6 months and dietary recall of intake trends. Resolving with weight stable past month but inadequate PO intake fluids continues.   Severe malnutrition in context of acute illness/injury    INTERVENTION:    Encouraged sipping during TV commercial breaks   Tried to have patient reflect on how much better he felt after IVF to motivate to continue to increased PO intake.   Encouraged trial of egg nog to boost both fluid and calories.     MONITORING, EVALUATION, GOAL: weight trends, nutrition impact symptoms, PO intake, labs     Next Visit: Remote next month after infusions   April Manson, RDN, LDN Registered Dietitian, [place] (Usual office hours: Tuesday-Thursday) Mobile: [phone]

## 2022-05-28 ENCOUNTER — Ambulatory Visit: Payer: Medicare Other

## 2022-05-28 ENCOUNTER — Encounter: Payer: Self-pay | Admitting: *Deleted

## 2022-06-01 ENCOUNTER — Inpatient Hospital Stay: Payer: Medicare Other

## 2022-06-01 ENCOUNTER — Encounter: Payer: Self-pay | Admitting: Hematology & Oncology

## 2022-06-01 ENCOUNTER — Inpatient Hospital Stay: Payer: Medicare Other | Admitting: Hematology & Oncology

## 2022-06-01 VITALS — BP 128/75 | HR 90 | Temp 97.5°F | Resp 20 | Ht 72.0 in | Wt 166.0 lb

## 2022-06-01 DIAGNOSIS — Z79899 Other long term (current) drug therapy: Secondary | ICD-10-CM | POA: Diagnosis not present

## 2022-06-01 DIAGNOSIS — C3491 Malignant neoplasm of unspecified part of right bronchus or lung: Secondary | ICD-10-CM | POA: Diagnosis not present

## 2022-06-01 DIAGNOSIS — Z923 Personal history of irradiation: Secondary | ICD-10-CM | POA: Diagnosis not present

## 2022-06-01 DIAGNOSIS — C3490 Malignant neoplasm of unspecified part of unspecified bronchus or lung: Secondary | ICD-10-CM | POA: Diagnosis not present

## 2022-06-01 DIAGNOSIS — E860 Dehydration: Secondary | ICD-10-CM | POA: Diagnosis not present

## 2022-06-01 DIAGNOSIS — Z5111 Encounter for antineoplastic chemotherapy: Secondary | ICD-10-CM | POA: Diagnosis not present

## 2022-06-01 DIAGNOSIS — C7931 Secondary malignant neoplasm of brain: Secondary | ICD-10-CM | POA: Diagnosis not present

## 2022-06-01 LAB — CBC WITH DIFFERENTIAL (CANCER CENTER ONLY)
Abs Immature Granulocytes: 0.5 10*3/uL — ABNORMAL HIGH (ref 0.00–0.07)
Basophils Absolute: 0.1 10*3/uL (ref 0.0–0.1)
Basophils Relative: 1 %
Eosinophils Absolute: 0 10*3/uL (ref 0.0–0.5)
Eosinophils Relative: 0 %
HCT: 31.7 % — ABNORMAL LOW (ref 39.0–52.0)
Hemoglobin: 10.5 g/dL — ABNORMAL LOW (ref 13.0–17.0)
Immature Granulocytes: 5 %
Lymphocytes Relative: 14 %
Lymphs Abs: 1.6 10*3/uL (ref 0.7–4.0)
MCH: 30.8 pg (ref 26.0–34.0)
MCHC: 33.1 g/dL (ref 30.0–36.0)
MCV: 93 fL (ref 80.0–100.0)
Monocytes Absolute: 1.2 10*3/uL — ABNORMAL HIGH (ref 0.1–1.0)
Monocytes Relative: 11 %
Neutro Abs: 7.8 10*3/uL — ABNORMAL HIGH (ref 1.7–7.7)
Neutrophils Relative %: 69 %
Platelet Count: 406 10*3/uL — ABNORMAL HIGH (ref 150–400)
RBC: 3.41 MIL/uL — ABNORMAL LOW (ref 4.22–5.81)
RDW: 14.9 % (ref 11.5–15.5)
WBC Count: 11.2 10*3/uL — ABNORMAL HIGH (ref 4.0–10.5)
nRBC: 0 % (ref 0.0–0.2)

## 2022-06-01 LAB — CMP (CANCER CENTER ONLY)
ALT: 20 U/L (ref 0–44)
AST: 19 U/L (ref 15–41)
Albumin: 3.5 g/dL (ref 3.5–5.0)
Alkaline Phosphatase: 61 U/L (ref 38–126)
Anion gap: 9 (ref 5–15)
BUN: 30 mg/dL — ABNORMAL HIGH (ref 8–23)
CO2: 24 mmol/L (ref 22–32)
Calcium: 8.9 mg/dL (ref 8.9–10.3)
Chloride: 102 mmol/L (ref 98–111)
Creatinine: 0.87 mg/dL (ref 0.61–1.24)
GFR, Estimated: >60 mL/min (ref >60)
Glucose, Bld: 154 mg/dL — ABNORMAL HIGH (ref 70–99)
Potassium: 3.9 mmol/L (ref 3.5–5.1)
Sodium: 135 mmol/L (ref 135–145)
Total Bilirubin: 0.5 mg/dL (ref 0.3–1.2)
Total Protein: 6 g/dL — ABNORMAL LOW (ref 6.5–8.1)

## 2022-06-01 LAB — LACTATE DEHYDROGENASE: LDH: 118 U/L (ref 98–192)

## 2022-06-01 LAB — SAMPLE TO BLOOD BANK

## 2022-06-01 MED ORDER — HEPARIN SOD (PORK) LOCK FLUSH 100 UNIT/ML IV SOLN
500.0000 [IU] | Freq: Once | INTRAVENOUS | Status: AC | PRN
  Administered 2022-06-01: 500 [IU]

## 2022-06-01 MED ORDER — SODIUM CHLORIDE 0.9 % IV SOLN
10.0000 mg | Freq: Once | INTRAVENOUS | Status: AC
  Administered 2022-06-01: 10 mg via INTRAVENOUS
  Filled 2022-06-01: qty 10

## 2022-06-01 MED ORDER — SODIUM CHLORIDE 0.9 % IV SOLN
1200.0000 mg | Freq: Once | INTRAVENOUS | Status: AC
  Administered 2022-06-01: 1200 mg via INTRAVENOUS
  Filled 2022-06-01: qty 20

## 2022-06-01 MED ORDER — SODIUM CHLORIDE 0.9 % IV SOLN: Freq: Once | INTRAVENOUS | Status: AC

## 2022-06-01 MED ORDER — SODIUM CHLORIDE 0.9% FLUSH
10.0000 mL | INTRAVENOUS | Status: DC | PRN
  Administered 2022-06-01: 10 mL

## 2022-06-01 MED ORDER — PALONOSETRON HCL INJECTION 0.25 MG/5ML
0.2500 mg | Freq: Once | INTRAVENOUS | Status: AC
  Administered 2022-06-01: 0.25 mg via INTRAVENOUS
  Filled 2022-06-01: qty 5

## 2022-06-01 MED ORDER — SODIUM CHLORIDE 0.9 % IV SOLN
75.0000 mg/m2 | Freq: Once | INTRAVENOUS | Status: AC
  Administered 2022-06-01: 150 mg via INTRAVENOUS
  Filled 2022-06-01: qty 7.5

## 2022-06-01 MED ORDER — SODIUM CHLORIDE 0.9 % IV SOLN
450.0000 mg | Freq: Once | INTRAVENOUS | Status: AC
  Administered 2022-06-01: 450 mg via INTRAVENOUS
  Filled 2022-06-01: qty 45

## 2022-06-01 MED ORDER — SODIUM CHLORIDE 0.9 % IV SOLN
150.0000 mg | Freq: Once | INTRAVENOUS | Status: AC
  Administered 2022-06-01: 150 mg via INTRAVENOUS
  Filled 2022-06-01: qty 150

## 2022-06-01 NOTE — Patient Instructions (Signed)
[facility]  Discharge Instructions: Thank you for choosing [facility] to provide your oncology and hematology care.   If you have a lab appointment with the [facility], please go directly to the [facility] and check in at the registration area.  Wear comfortable clothing and clothing appropriate for easy access to any Portacath or PICC line.   We strive to give you quality time with your provider. You may need to reschedule your appointment if you arrive late (15 or more minutes).  Arriving late affects you and other patients whose appointments are after yours.  Also, if you miss three or more appointments without notifying the office, you may be dismissed from the clinic at the provider's discretion.      For prescription refill requests, have your pharmacy contact our office and allow 72 hours for refills to be completed.    Today you received the following chemotherapy and/or immunotherapy agents vepesid   To help prevent nausea and vomiting after your treatment, we encourage you to take your nausea medication as directed.  BELOW ARE SYMPTOMS THAT SHOULD BE REPORTED IMMEDIATELY: *FEVER GREATER THAN 100.4 F (38 C) OR HIGHER *CHILLS OR SWEATING *NAUSEA AND VOMITING THAT IS NOT CONTROLLED WITH YOUR NAUSEA MEDICATION *UNUSUAL SHORTNESS OF BREATH *UNUSUAL BRUISING OR BLEEDING *URINARY PROBLEMS (pain or burning when urinating, or frequent urination) *BOWEL PROBLEMS (unusual diarrhea, constipation, pain near the anus) TENDERNESS IN MOUTH AND THROAT WITH OR WITHOUT PRESENCE OF ULCERS (sore throat, sores in mouth, or a toothache) UNUSUAL RASH, SWELLING OR PAIN  UNUSUAL VAGINAL DISCHARGE OR ITCHING   Items with * indicate a potential emergency and should be followed up as soon as possible or go to the Emergency Department if any problems should occur.  Please show the CHEMOTHERAPY ALERT CARD or IMMUNOTHERAPY ALERT CARD at check-in to the  Emergency Department and triage nurse. Should you have questions after your visit or need to cancel or reschedule your appointment, please contact [facility]  [phone] and follow the prompts.  Office hours are 8:00 a.m. to 4:30 p.m. Monday - Friday. Please note that voicemails left after 4:00 p.m. may not be returned until the following business day.  We are closed weekends and major holidays. You have access to a nurse at all times for urgent questions. Please call the main number to the clinic [phone] and follow the prompts.  For any non-urgent questions, you may also contact your provider using MyChart. We now offer e-Visits for anyone 87 and older to request care online for non-urgent symptoms. For details visit [email or web address].   Also download the MyChart app! Go to the app store, search "MyChart", open the app, select [facility], and log in with your MyChart username and password.  Masks are optional in the cancer centers. If you would like for your care team to wear a mask while they are taking care of you, please let them know. You may have one support person who is at least 81 years old accompany you for your appointments.

## 2022-06-01 NOTE — Progress Notes (Signed)
Hematology and Oncology Follow Up Visit  Harry Brown [id number] November 06, 1940 81 y.o. 06/01/2022   Principle Diagnosis:  Extensive stage small cell lung cancer-CNS metastasis  Current Therapy:   Cranial radiotherapy --completed on 04/13/2022 Carboplatin/VP-16/Tecentriq -- s/p cycle #1 -- start on 05/03/2022     Interim History:  Harry Brown is back for follow-up.  He is looking better.  He feels a bit better.  We gave him some IV fluids last week.  He thankfully, did not need any transfusions.  Had a nice Christmas.  His family make sure that he was not too busy.  He has had no problems with bowels or bladder.  He has had no bleeding.  He has had no cough or shortness of breath.  He has had no nausea or vomiting.  He has had no rashes.  He is back on low-dose Eliquis for the atrial fibrillation.  He has had no fever.  Overall, I would have said that his performance status is probably ECOG 2.    Medications:  Current Outpatient Medications:    apixaban (ELIQUIS) 2.5 MG TABS tablet, Take 1 tablet (2.5 mg total) by mouth 2 (two) times daily., Disp: 60 tablet, Rfl: 6   atenolol (TENORMIN) 25 MG tablet, TAKE ONE (1) TABLET BY MOUTH EVERY DAY (Patient taking differently: Take 25 mg by mouth daily.), Disp: 90 tablet, Rfl: 0   atorvastatin (LIPITOR) 40 MG tablet, TAKE ONE (1) TABLET BY MOUTH EACH DAY AT6PM, Disp: 90 tablet, Rfl: 1   Cholecalciferol (VITAMIN D) 50 MCG (2000 UT) tablet, Take 2,000 Units by mouth daily. , Disp: , Rfl:    dexamethasone (DECADRON) 4 MG tablet, Take 2 tabs by mouth starting day after last dose of etoposide for one day only. Repeat every 21 days. Take with food., Disp: 8 tablet, Rfl: 0   dronabinol (MARINOL) 2.5 MG capsule, Take 1 capsule (2.5 mg total) by mouth 2 (two) times daily before lunch and supper. (Patient taking differently: Take 5 mg by mouth 2 (two) times daily before lunch and supper.), Disp: 60 capsule, Rfl: 0   latanoprost (XALATAN) 0.005 %  ophthalmic solution, Place 1 drop into both eyes at bedtime. , Disp: , Rfl:    levothyroxine (SYNTHROID) 75 MCG tablet, Take 1 tablet (75 mcg total) by mouth daily before breakfast., Disp: 90 tablet, Rfl: 0   loratadine (CLARITIN) 10 MG tablet, Take 10 mg by mouth daily as needed for allergies., Disp: , Rfl:    metFORMIN (GLUCOPHAGE-XR) 500 MG 24 hr tablet, TAKE TWO (2) TABLETS BY MOUTH EVERY MORNING WITH BREAKFAST (Patient taking differently: Take 1,000 mg by mouth in the morning.), Disp: 180 tablet, Rfl: 1   ONETOUCH DELICA LANCETS 58N MISC, USE TO CHECK BLOOD SUGAR ONCE DAILY, Disp: 100 each, Rfl: 1   ONETOUCH ULTRA test strip, USE 1 STRIP DAILY, Disp: 100 each, Rfl: 12   tamsulosin (FLOMAX) 0.4 MG CAPS capsule, Take 1 capsule (0.4 mg total) by mouth daily., Disp: 90 capsule, Rfl: 1   vitamin B-12 (CYANOCOBALAMIN) 1000 MCG tablet, Take 1,000 mcg by mouth daily., Disp: , Rfl:    lidocaine-prilocaine (EMLA) cream, Apply 1 Application topically as needed. (Patient not taking: Reported on 06/01/2022), Disp: 30 g, Rfl: 3   nitroGLYCERIN (NITROSTAT) 0.4 MG SL tablet, Place 1 tablet (0.4 mg total) under the tongue every 5 (five) minutes as needed for chest pain., Disp: 25 tablet, Rfl: 11   ondansetron (ZOFRAN) 8 MG tablet, Take 1 tablet (8 mg total)  by mouth every 8 (eight) hours as needed for nausea, vomiting or refractory nausea / vomiting. Start on the third day after carboplatin. (Patient not taking: Reported on 05/13/2022), Disp: 30 tablet, Rfl: 1   prochlorperazine (COMPAZINE) 10 MG tablet, Take 1 tablet (10 mg total) by mouth every 6 (six) hours as needed for nausea or vomiting. (Patient not taking: Reported on 05/13/2022), Disp: 30 tablet, Rfl: 1  Allergies:  Allergies  Allergen Reactions   Ativan [Lorazepam] Other (See Comments)    Proloneged lethargy   Adhesive [Tape] Rash   Latex Rash   Other Hives, Swelling and Rash    Plastic shopping bags Metal staples: rash, swelling    Past Medical  History, Surgical history, Social history, and Family History were reviewed and updated.  Review of Systems: Review of Systems  Constitutional: Negative.   HENT:  Negative.    Eyes: Negative.   Respiratory: Negative.    Cardiovascular: Negative.   Gastrointestinal: Negative.   Endocrine: Negative.   Genitourinary: Negative.    Musculoskeletal: Negative.   Skin: Negative.   Neurological: Negative.   Hematological: Negative.   Psychiatric/Behavioral: Negative.      Physical Exam:  height is 6' (1.829 m) and weight is 166 lb (75.3 kg). His oral temperature is 97.5 F (36.4 C) (abnormal). His blood pressure is 128/75 and his pulse is 90. His respiration is 20 and oxygen saturation is 95%.   Wt Readings from Last 3 Encounters:  06/01/22 166 lb (75.3 kg)  05/25/22 164 lb (74.4 kg)  05/13/22 164 lb 12.8 oz (74.8 kg)    Physical Exam Vitals reviewed.  HENT:     Head: Normocephalic and atraumatic.  Eyes:     Pupils: Pupils are equal, round, and reactive to light.  Cardiovascular:     Rate and Rhythm: Normal rate and regular rhythm.     Heart sounds: Normal heart sounds.  Pulmonary:     Effort: Pulmonary effort is normal.     Breath sounds: Normal breath sounds.  Abdominal:     General: Bowel sounds are normal.     Palpations: Abdomen is soft.  Musculoskeletal:        General: No tenderness or deformity. Normal range of motion.     Cervical back: Normal range of motion.  Lymphadenopathy:     Cervical: No cervical adenopathy.  Skin:    General: Skin is warm and dry.     Findings: No erythema or rash.  Neurological:     Mental Status: He is alert and oriented to person, place, and time.  Psychiatric:        Behavior: Behavior normal.        Thought Content: Thought content normal.        Judgment: Judgment normal.      Lab Results  Component Value Date   WBC 16.6 (H) 05/26/2022   HGB 9.6 (L) 05/26/2022   HCT 28.5 (L) 05/26/2022   MCV 91.9 05/26/2022   PLT 332  05/26/2022     Chemistry      Component Value Date/Time   NA 136 05/26/2022 0921   NA 139 02/04/2022 0921   K 3.7 05/26/2022 0921   CL 104 05/26/2022 0921   CO2 21 (L) 05/26/2022 0921   BUN 37 (H) 05/26/2022 0921   BUN 21 02/04/2022 0921   CREATININE 0.89 05/26/2022 0921   CREATININE 1.16 (H) 04/03/2020 1044      Component Value Date/Time   CALCIUM 8.8 (L) 05/26/2022 1610  ALKPHOS 51 05/26/2022 0921   AST 20 05/26/2022 0921   ALT 18 05/26/2022 0921   BILITOT 0.4 05/26/2022 6503      Impression and Plan: Harry Brown is a very nice 81 year old white male.  He has extensive stage small cell lung cancer.  However, the extensive stage is only with brain metastasis.  He is doing a little bit better.  I will go ahead and make an adjustment with the etoposide dose.  Hopefully, by doing this, he will be able to tolerate treatment will be better.  I think he is going to need IV fluid after his cycle this week.  We will go ahead and give him some fluid when he comes back on Friday for his Neulasta shot.  I forgot to mention that he is increasing the Marinol.  Hopefully, this might help with the appetite.  I will go ahead and scan him after this cycle of treatment.  We will plan to get him back in 3 more weeks.  Volanda Napoleon, MD 12/26/20238:47 AM

## 2022-06-01 NOTE — Patient Instructions (Signed)

## 2022-06-02 ENCOUNTER — Other Ambulatory Visit: Payer: Self-pay | Admitting: *Deleted

## 2022-06-02 ENCOUNTER — Other Ambulatory Visit: Payer: Self-pay | Admitting: Hematology & Oncology

## 2022-06-02 ENCOUNTER — Inpatient Hospital Stay: Payer: Medicare Other

## 2022-06-02 VITALS — BP 112/65 | HR 91 | Temp 98.6°F | Resp 18

## 2022-06-02 DIAGNOSIS — Z923 Personal history of irradiation: Secondary | ICD-10-CM | POA: Diagnosis not present

## 2022-06-02 DIAGNOSIS — E860 Dehydration: Secondary | ICD-10-CM | POA: Diagnosis not present

## 2022-06-02 DIAGNOSIS — Z5111 Encounter for antineoplastic chemotherapy: Secondary | ICD-10-CM | POA: Diagnosis not present

## 2022-06-02 DIAGNOSIS — C7931 Secondary malignant neoplasm of brain: Secondary | ICD-10-CM | POA: Diagnosis not present

## 2022-06-02 DIAGNOSIS — C3490 Malignant neoplasm of unspecified part of unspecified bronchus or lung: Secondary | ICD-10-CM | POA: Diagnosis not present

## 2022-06-02 DIAGNOSIS — C3491 Malignant neoplasm of unspecified part of right bronchus or lung: Secondary | ICD-10-CM

## 2022-06-02 DIAGNOSIS — Z79899 Other long term (current) drug therapy: Secondary | ICD-10-CM | POA: Diagnosis not present

## 2022-06-02 MED ORDER — SODIUM CHLORIDE 0.9 % IV SOLN
75.0000 mg/m2 | Freq: Once | INTRAVENOUS | Status: AC
  Administered 2022-06-02: 150 mg via INTRAVENOUS
  Filled 2022-06-02: qty 7.5

## 2022-06-02 MED ORDER — DRONABINOL 2.5 MG PO CAPS: 5.0000 mg | ORAL_CAPSULE | Freq: Two times a day (BID) | ORAL | 0 refills | Status: DC

## 2022-06-02 MED ORDER — HEPARIN SOD (PORK) LOCK FLUSH 100 UNIT/ML IV SOLN
500.0000 [IU] | Freq: Once | INTRAVENOUS | Status: AC | PRN
  Administered 2022-06-02: 500 [IU]

## 2022-06-02 MED ORDER — SODIUM CHLORIDE 0.9 % IV SOLN
10.0000 mg | Freq: Once | INTRAVENOUS | Status: AC
  Administered 2022-06-02: 10 mg via INTRAVENOUS
  Filled 2022-06-02: qty 10

## 2022-06-02 MED ORDER — SODIUM CHLORIDE 0.9% FLUSH
10.0000 mL | INTRAVENOUS | Status: DC | PRN
  Administered 2022-06-02: 10 mL

## 2022-06-02 MED ORDER — SODIUM CHLORIDE 0.9 % IV SOLN: Freq: Once | INTRAVENOUS | Status: AC

## 2022-06-02 NOTE — Patient Instructions (Signed)
[facility]  Discharge Instructions: Thank you for choosing [facility] to provide your oncology and hematology care.   If you have a lab appointment with the [facility], please go directly to the [facility] and check in at the registration area.  Wear comfortable clothing and clothing appropriate for easy access to any Portacath or PICC line.   We strive to give you quality time with your provider. You may need to reschedule your appointment if you arrive late (15 or more minutes).  Arriving late affects you and other patients whose appointments are after yours.  Also, if you miss three or more appointments without notifying the office, you may be dismissed from the clinic at the provider's discretion.      For prescription refill requests, have your pharmacy contact our office and allow 72 hours for refills to be completed.    Today you received the following chemotherapy and/or immunotherapy agents vepesid   To help prevent nausea and vomiting after your treatment, we encourage you to take your nausea medication as directed.  BELOW ARE SYMPTOMS THAT SHOULD BE REPORTED IMMEDIATELY: *FEVER GREATER THAN 100.4 F (38 C) OR HIGHER *CHILLS OR SWEATING *NAUSEA AND VOMITING THAT IS NOT CONTROLLED WITH YOUR NAUSEA MEDICATION *UNUSUAL SHORTNESS OF BREATH *UNUSUAL BRUISING OR BLEEDING *URINARY PROBLEMS (pain or burning when urinating, or frequent urination) *BOWEL PROBLEMS (unusual diarrhea, constipation, pain near the anus) TENDERNESS IN MOUTH AND THROAT WITH OR WITHOUT PRESENCE OF ULCERS (sore throat, sores in mouth, or a toothache) UNUSUAL RASH, SWELLING OR PAIN  UNUSUAL VAGINAL DISCHARGE OR ITCHING   Items with * indicate a potential emergency and should be followed up as soon as possible or go to the Emergency Department if any problems should occur.  Please show the CHEMOTHERAPY ALERT CARD or IMMUNOTHERAPY ALERT CARD at check-in to the  Emergency Department and triage nurse. Should you have questions after your visit or need to cancel or reschedule your appointment, please contact [facility]  [phone] and follow the prompts.  Office hours are 8:00 a.m. to 4:30 p.m. Monday - Friday. Please note that voicemails left after 4:00 p.m. may not be returned until the following business day.  We are closed weekends and major holidays. You have access to a nurse at all times for urgent questions. Please call the main number to the clinic [phone] and follow the prompts.  For any non-urgent questions, you may also contact your provider using MyChart. We now offer e-Visits for anyone 1 and older to request care online for non-urgent symptoms. For details visit [email or web address].   Also download the MyChart app! Go to the app store, search "MyChart", open the app, select [facility], and log in with your MyChart username and password.  Masks are optional in the cancer centers. If you would like for your care team to wear a mask while they are taking care of you, please let them know. You may have one support person who is at least 82 years old accompany you for your appointments.

## 2022-06-03 ENCOUNTER — Inpatient Hospital Stay: Payer: Medicare Other

## 2022-06-03 VITALS — BP 90/70 | HR 86 | Temp 97.9°F | Resp 19

## 2022-06-03 DIAGNOSIS — E860 Dehydration: Secondary | ICD-10-CM | POA: Diagnosis not present

## 2022-06-03 DIAGNOSIS — Z79899 Other long term (current) drug therapy: Secondary | ICD-10-CM | POA: Diagnosis not present

## 2022-06-03 DIAGNOSIS — C3490 Malignant neoplasm of unspecified part of unspecified bronchus or lung: Secondary | ICD-10-CM | POA: Diagnosis not present

## 2022-06-03 DIAGNOSIS — C3491 Malignant neoplasm of unspecified part of right bronchus or lung: Secondary | ICD-10-CM

## 2022-06-03 DIAGNOSIS — Z923 Personal history of irradiation: Secondary | ICD-10-CM | POA: Diagnosis not present

## 2022-06-03 DIAGNOSIS — Z5111 Encounter for antineoplastic chemotherapy: Secondary | ICD-10-CM | POA: Diagnosis not present

## 2022-06-03 DIAGNOSIS — C7931 Secondary malignant neoplasm of brain: Secondary | ICD-10-CM | POA: Diagnosis not present

## 2022-06-03 MED ORDER — SODIUM CHLORIDE 0.9 % IV SOLN: Freq: Once | INTRAVENOUS | Status: AC

## 2022-06-03 MED ORDER — HEPARIN SOD (PORK) LOCK FLUSH 100 UNIT/ML IV SOLN
500.0000 [IU] | Freq: Once | INTRAVENOUS | Status: AC | PRN
  Administered 2022-06-03: 500 [IU]

## 2022-06-03 MED ORDER — SODIUM CHLORIDE 0.9% FLUSH
10.0000 mL | INTRAVENOUS | Status: DC | PRN
  Administered 2022-06-03: 10 mL

## 2022-06-03 MED ORDER — SODIUM CHLORIDE 0.9 % IV SOLN
10.0000 mg | Freq: Once | INTRAVENOUS | Status: AC
  Administered 2022-06-03: 10 mg via INTRAVENOUS
  Filled 2022-06-03: qty 10

## 2022-06-03 MED ORDER — SODIUM CHLORIDE 0.9 % IV SOLN
75.0000 mg/m2 | Freq: Once | INTRAVENOUS | Status: AC
  Administered 2022-06-03: 150 mg via INTRAVENOUS
  Filled 2022-06-03: qty 7.5

## 2022-06-03 NOTE — Patient Instructions (Signed)
[facility]  Discharge Instructions: Thank you for choosing [facility] to provide your oncology and hematology care.   If you have a lab appointment with the [facility], please go directly to the [name] and check in at the registration area.  Wear comfortable clothing and clothing appropriate for easy access to any Portacath or PICC line.   We strive to give you quality time with your provider. You may need to reschedule your appointment if you arrive late (15 or more minutes).  Arriving late affects you and other patients whose appointments are after yours.  Also, if you miss three or more appointments without notifying the office, you may be dismissed from the clinic at the provider's discretion.      For prescription refill requests, have your pharmacy contact our office and allow 72 hours for refills to be completed.    Today you received the following chemotherapy and/or immunotherapy agents VP16      To help prevent nausea and vomiting after your treatment, we encourage you to take your nausea medication as directed.  BELOW ARE SYMPTOMS THAT SHOULD BE REPORTED IMMEDIATELY: *FEVER GREATER THAN 100.4 F (38 C) OR HIGHER *CHILLS OR SWEATING *NAUSEA AND VOMITING THAT IS NOT CONTROLLED WITH YOUR NAUSEA MEDICATION *UNUSUAL SHORTNESS OF BREATH *UNUSUAL BRUISING OR BLEEDING *URINARY PROBLEMS (pain or burning when urinating, or frequent urination) *BOWEL PROBLEMS (unusual diarrhea, constipation, pain near the anus) TENDERNESS IN MOUTH AND THROAT WITH OR WITHOUT PRESENCE OF ULCERS (sore throat, sores in mouth, or a toothache) UNUSUAL RASH, SWELLING OR PAIN  UNUSUAL VAGINAL DISCHARGE OR ITCHING   Items with * indicate a potential emergency and should be followed up as soon as possible or go to the Emergency Department if any problems should occur.  Please show the CHEMOTHERAPY ALERT CARD or IMMUNOTHERAPY ALERT CARD at check-in to the  Emergency Department and triage nurse. Should you have questions after your visit or need to cancel or reschedule your appointment, please contact [facility]  [phone] and follow the prompts.  Office hours are 8:00 a.m. to 4:30 p.m. Monday - Friday. Please note that voicemails left after 4:00 p.m. may not be returned until the following business day.  We are closed weekends and major holidays. You have access to a nurse at all times for urgent questions. Please call the main number to the clinic [phone] and follow the prompts.  For any non-urgent questions, you may also contact your provider using MyChart. We now offer e-Visits for anyone 30 and older to request care online for non-urgent symptoms. For details visit [email or web address].   Also download the MyChart app! Go to the app store, search "MyChart", open the app, select [facility], and log in with your MyChart username and password.

## 2022-06-04 ENCOUNTER — Other Ambulatory Visit: Payer: Self-pay | Admitting: Family Medicine

## 2022-06-04 ENCOUNTER — Inpatient Hospital Stay: Payer: Medicare Other

## 2022-06-04 VITALS — BP 128/68 | HR 83 | Temp 98.0°F | Resp 17

## 2022-06-04 DIAGNOSIS — C7931 Secondary malignant neoplasm of brain: Secondary | ICD-10-CM | POA: Diagnosis not present

## 2022-06-04 DIAGNOSIS — C3491 Malignant neoplasm of unspecified part of right bronchus or lung: Secondary | ICD-10-CM

## 2022-06-04 DIAGNOSIS — Z79899 Other long term (current) drug therapy: Secondary | ICD-10-CM | POA: Diagnosis not present

## 2022-06-04 DIAGNOSIS — Z5111 Encounter for antineoplastic chemotherapy: Secondary | ICD-10-CM | POA: Diagnosis not present

## 2022-06-04 DIAGNOSIS — E860 Dehydration: Secondary | ICD-10-CM | POA: Diagnosis not present

## 2022-06-04 DIAGNOSIS — C3490 Malignant neoplasm of unspecified part of unspecified bronchus or lung: Secondary | ICD-10-CM | POA: Diagnosis not present

## 2022-06-04 DIAGNOSIS — Z923 Personal history of irradiation: Secondary | ICD-10-CM | POA: Diagnosis not present

## 2022-06-04 MED ORDER — PEGFILGRASTIM INJECTION 6 MG/0.6ML ~~LOC~~
6.0000 mg | PREFILLED_SYRINGE | Freq: Once | SUBCUTANEOUS | Status: AC
  Administered 2022-06-04: 6 mg via SUBCUTANEOUS
  Filled 2022-06-04: qty 0.6

## 2022-06-04 MED ORDER — HEPARIN SOD (PORK) LOCK FLUSH 100 UNIT/ML IV SOLN
500.0000 [IU] | Freq: Once | INTRAVENOUS | Status: AC
  Administered 2022-06-04: 500 [IU] via INTRAVENOUS

## 2022-06-04 MED ORDER — SODIUM CHLORIDE 0.9 % IV SOLN: INTRAVENOUS | Status: DC

## 2022-06-04 MED ORDER — SODIUM CHLORIDE 0.9% FLUSH
10.0000 mL | Freq: Once | INTRAVENOUS | Status: AC
  Administered 2022-06-04: 10 mL via INTRAVENOUS

## 2022-06-04 NOTE — Patient Instructions (Signed)
Dehydration, Adult Dehydration is a condition in which there is not enough water or other fluids in the body. This happens when a person loses more fluids than he or she takes in. Important organs, such as the kidneys, brain, and heart, cannot function without a proper amount of fluids. Any loss of fluids from the body can lead to dehydration. Dehydration can be mild, moderate, or severe. It should be treated right away to prevent it from becoming severe. What are the causes? Dehydration may be caused by: Conditions that cause loss of water or other fluids, such as diarrhea, vomiting, or sweating or urinating a lot. Not drinking enough fluids, especially when you are ill or doing activities that require a lot of energy. Other illnesses and conditions, such as fever or infection. Certain medicines, such as medicines that remove excess fluid from the body (diuretics). Lack of safe drinking water. Not being able to get enough water and food. What increases the risk? The following factors may make you more likely to develop this condition: Having a long-term (chronic) illness that has not been treated properly, such as diabetes, heart disease, or kidney disease. Being 65 years of age or older. Having a disability. Living in a place that is high in altitude, where thinner, drier air causes more fluid loss. Doing exercises that put stress on your body for a long time (endurance sports). What are the signs or symptoms? Symptoms of dehydration depend on how severe it is. Mild or moderate dehydration Thirst. Dry lips or dry mouth. Dizziness or light-headedness, especially when standing up from a seated position. Muscle cramps. Dark urine. Urine may be the color of tea. Less urine or tears produced than usual. Headache. Severe dehydration Changes in skin. Your skin may be cold and clammy, blotchy, or pale. Your skin also may not return to normal after being lightly pinched and released. Little or  no tears, urine, or sweat. Changes in vital signs, such as rapid breathing and low blood pressure. Your pulse may be weak or may be faster than 100 beats a minute when you are sitting still. Other changes, such as: Feeling very thirsty. Sunken eyes. Cold hands and feet. Confusion. Being very tired (lethargic) or having trouble waking from sleep. Short-term weight loss. Loss of consciousness. How is this diagnosed? This condition is diagnosed based on your symptoms and a physical exam. You may have blood and urine tests to help confirm the diagnosis. How is this treated? Treatment for this condition depends on how severe it is. Treatment should be started right away. Do not wait until dehydration becomes severe. Severe dehydration is an emergency and needs to be treated in a hospital. Mild or moderate dehydration can be treated at home. You may be asked to: Drink more fluids. Drink an oral rehydration solution (ORS). This drink helps restore proper amounts of fluids and salts and minerals in the blood (electrolytes). Severe dehydration can be treated: With IV fluids. By correcting abnormal levels of electrolytes. This is often done by giving electrolytes through a tube that is passed through your nose and into your stomach (nasogastric tube, or NG tube). By treating the underlying cause of dehydration. Follow these instructions at home: Oral rehydration solution If told by your health care provider, drink an ORS: Make an ORS by following instructions on the package. Start by drinking small amounts, about  cup (120 mL) every 5-10 minutes. Slowly increase how much you drink until you have taken the amount recommended by your health   care provider. Eating and drinking        Drink enough clear fluid to keep your urine pale yellow. If you were told to drink an ORS, finish the ORS first and then start slowly drinking other clear fluids. Drink fluids such as: Water. Do not drink only  water. Doing that can lead to hyponatremia, which is having too little salt (sodium) in the body. Water from ice chips you suck on. Fruit juice that you have added water to (diluted fruit juice). Low-calorie sports drinks. Eat foods that contain a healthy balance of electrolytes, such as bananas, oranges, potatoes, tomatoes, and spinach. Do not drink alcohol. Avoid the following: Drinks that contain a lot of sugar. These include high-calorie sports drinks, fruit juice that is not diluted, and soda. Caffeine. Foods that are greasy or contain a lot of fat or sugar. General instructions Take over-the-counter and prescription medicines only as told by your health care provider. Do not take sodium tablets. Doing that can lead to having too much sodium in the body (hypernatremia). Return to your normal activities as told by your health care provider. Ask your health care provider what activities are safe for you. Keep all follow-up visits as told by your health care provider. This is important. Contact a health care provider if: You have muscle cramps, pain, or discomfort, such as: Pain in your abdomen and the pain gets worse or stays in one area (localizes). Stiff neck. You have a rash. You are more irritable than usual. You are sleepier or have a harder time waking than usual. You feel weak or dizzy. You feel very thirsty. Get help right away if you have: Any symptoms of severe dehydration. Symptoms of vomiting, such as: You cannot eat or drink without vomiting. Vomiting gets worse or does not go away. Vomit includes blood or green matter (bile). Symptoms that get worse with treatment. A fever. A severe headache. Problems with urination or bowel movements, such as: Diarrhea that gets worse or does not go away. Blood in your stool (feces). This may cause stool to look black and tarry. Not urinating, or urinating only a small amount of very dark urine, within 6-8 hours. Trouble  breathing. These symptoms may represent a serious problem that is an emergency. Do not wait to see if the symptoms will go away. Get medical help right away. Call your local emergency services (911 in the [place] Do not drive yourself to the hospital. Summary Dehydration is a condition in which there is not enough water or other fluids in the body. This happens when a person loses more fluids than he or she takes in. Treatment for this condition depends on how severe it is. Treatment should be started right away. Do not wait until dehydration becomes severe. Drink enough clear fluid to keep your urine pale yellow. If you were told to drink an oral rehydration solution (ORS), finish the ORS first and then start slowly drinking other clear fluids. Take over-the-counter and prescription medicines only as told by your health care provider. Get help right away if you have any symptoms of severe dehydration. This information is not intended to replace advice given to you by your health care provider. Make sure you discuss any questions you have with your health care provider. Document Revised: 09/30/2021 Document Reviewed: 01/04/2019 Elsevier Patient Education  2023 [employer]  

## 2022-06-09 ENCOUNTER — Encounter: Payer: Self-pay | Admitting: *Deleted

## 2022-06-09 NOTE — Progress Notes (Signed)
Patient is scheduled for PET 06/25/22 however his next treatment is scheduled for 06/21/22. Spoke to Dr Marin Olp and he needs PET prior to next treatment cycle. Called centralized scheduling and there is nothing available prior to the 19th.   Called and spoke to patient. He doesn't want to go to another site for PET. He would rather delay the treatment to the next week. Message sent to scheduling.  Oncology Nurse Navigator Documentation     06/09/2022   11:45 AM  Oncology Nurse Navigator Flowsheets  Navigator Follow Up Date: 06/25/2022  Navigator Follow Up Reason: Scan Review  Navigator Location [facility]  Navigator Encounter Type Appt/Treatment Plan Review;Telephone  Telephone Outgoing Call  Patient Visit Type MedOnc  Treatment Phase Active Tx  Barriers/Navigation Needs Coordination of Care;Education  Education Other  Interventions Coordination of Care;Education  Acuity Level 2-Minimal Needs (1-2 Barriers Identified)  Coordination of Care Appts;Radiology  Education Method Verbal  Support Groups/Services Friends and Family  Time Spent with Patient 30

## 2022-06-11 ENCOUNTER — Inpatient Hospital Stay: Payer: Medicare Other

## 2022-06-11 ENCOUNTER — Encounter: Payer: Self-pay | Admitting: *Deleted

## 2022-06-11 ENCOUNTER — Other Ambulatory Visit: Payer: Self-pay | Admitting: Family

## 2022-06-11 ENCOUNTER — Telehealth: Payer: Self-pay

## 2022-06-11 ENCOUNTER — Other Ambulatory Visit: Payer: Self-pay

## 2022-06-11 ENCOUNTER — Inpatient Hospital Stay (HOSPITAL_BASED_OUTPATIENT_CLINIC_OR_DEPARTMENT_OTHER): Payer: Medicare Other | Admitting: Hematology & Oncology

## 2022-06-11 ENCOUNTER — Inpatient Hospital Stay: Payer: Medicare Other | Attending: Hematology & Oncology

## 2022-06-11 ENCOUNTER — Encounter: Payer: Self-pay | Admitting: Hematology & Oncology

## 2022-06-11 VITALS — BP 111/62 | HR 67 | Temp 98.0°F | Resp 18 | Ht 72.0 in | Wt 167.0 lb

## 2022-06-11 VITALS — BP 121/71 | HR 72 | Temp 98.0°F | Resp 18

## 2022-06-11 DIAGNOSIS — E860 Dehydration: Secondary | ICD-10-CM

## 2022-06-11 DIAGNOSIS — C3491 Malignant neoplasm of unspecified part of right bronchus or lung: Secondary | ICD-10-CM

## 2022-06-11 DIAGNOSIS — C3490 Malignant neoplasm of unspecified part of unspecified bronchus or lung: Secondary | ICD-10-CM | POA: Diagnosis not present

## 2022-06-11 DIAGNOSIS — D709 Neutropenia, unspecified: Secondary | ICD-10-CM | POA: Insufficient documentation

## 2022-06-11 DIAGNOSIS — D649 Anemia, unspecified: Secondary | ICD-10-CM

## 2022-06-11 DIAGNOSIS — Z79899 Other long term (current) drug therapy: Secondary | ICD-10-CM | POA: Diagnosis not present

## 2022-06-11 DIAGNOSIS — C7931 Secondary malignant neoplasm of brain: Secondary | ICD-10-CM | POA: Insufficient documentation

## 2022-06-11 LAB — CBC WITH DIFFERENTIAL (CANCER CENTER ONLY)
Abs Immature Granulocytes: 0.02 10*3/uL (ref 0.00–0.07)
Basophils Absolute: 0 10*3/uL (ref 0.0–0.1)
Basophils Relative: 1 %
Eosinophils Absolute: 0 10*3/uL (ref 0.0–0.5)
Eosinophils Relative: 1 %
HCT: 26.3 % — ABNORMAL LOW (ref 39.0–52.0)
Hemoglobin: 8.8 g/dL — ABNORMAL LOW (ref 13.0–17.0)
Immature Granulocytes: 1 %
Lymphocytes Relative: 51 %
Lymphs Abs: 0.8 10*3/uL (ref 0.7–4.0)
MCH: 31 pg (ref 26.0–34.0)
MCHC: 33.5 g/dL (ref 30.0–36.0)
MCV: 92.6 fL (ref 80.0–100.0)
Monocytes Absolute: 0.3 10*3/uL (ref 0.1–1.0)
Monocytes Relative: 18 %
Neutro Abs: 0.4 10*3/uL — CL (ref 1.7–7.7)
Neutrophils Relative %: 28 %
Platelet Count: 72 10*3/uL — ABNORMAL LOW (ref 150–400)
RBC: 2.84 MIL/uL — ABNORMAL LOW (ref 4.22–5.81)
RDW: 14.6 % (ref 11.5–15.5)
Smear Review: NORMAL
WBC Count: 1.5 10*3/uL — ABNORMAL LOW (ref 4.0–10.5)
nRBC: 0 % (ref 0.0–0.2)

## 2022-06-11 LAB — CMP (CANCER CENTER ONLY)
ALT: 16 U/L (ref 0–44)
AST: 15 U/L (ref 15–41)
Albumin: 3.7 g/dL (ref 3.5–5.0)
Alkaline Phosphatase: 56 U/L (ref 38–126)
Anion gap: 9 (ref 5–15)
BUN: 45 mg/dL — ABNORMAL HIGH (ref 8–23)
CO2: 25 mmol/L (ref 22–32)
Calcium: 8.7 mg/dL — ABNORMAL LOW (ref 8.9–10.3)
Chloride: 102 mmol/L (ref 98–111)
Creatinine: 0.87 mg/dL (ref 0.61–1.24)
GFR, Estimated: >60 mL/min (ref >60)
Glucose, Bld: 113 mg/dL — ABNORMAL HIGH (ref 70–99)
Potassium: 3.5 mmol/L (ref 3.5–5.1)
Sodium: 136 mmol/L (ref 135–145)
Total Bilirubin: 0.9 mg/dL (ref 0.3–1.2)
Total Protein: 6.5 g/dL (ref 6.5–8.1)

## 2022-06-11 LAB — TSH: TSH: 2.516 u[IU]/mL (ref 0.350–4.500)

## 2022-06-11 LAB — PREALBUMIN: Prealbumin: 17 mg/dL — ABNORMAL LOW (ref 18–38)

## 2022-06-11 LAB — SAMPLE TO BLOOD BANK

## 2022-06-11 LAB — PREPARE RBC (CROSSMATCH)

## 2022-06-11 MED ORDER — SODIUM CHLORIDE 0.9 % IV SOLN: Freq: Once | INTRAVENOUS | Status: AC

## 2022-06-11 NOTE — Progress Notes (Signed)
I saw Mr. Kontz today for an unscheduled visit.  He came in because his wife calls that he was very weak.  He had his second cycle of chemotherapy about a week or so ago.  He did get Neulasta.  His white cell count is on the low side.  He is neutropenic.  He has had no fever.  More importantly I think is the fact that his hemoglobin is down to 8.8.  I think is dehydrated.  I suspect that his hemoglobin will get lower.  He does not have much of an appetite.  He has had no diarrhea.  He has had no nausea or vomiting.  I think a lot of his current status is probably from his low blood counts.  His white cell count is 1.5.  Hemoglobin 8.8.  Platelet count 72,000.  His BUN is quite high.  He did get some IV fluids in the office today.  I think he clearly needs to have low blood transfusion.  We will try to get a transfusion tomorrow at [facility].  Again, if the weather is not good, then we will make sure that he gets transfused in our office on Monday.  His vital signs are temperature of 98.  Pulse 67.  Blood pressure 111/62.  His weight is 167 pounds.  His head neck exam does show some dry oral mucosa.  He has no scleral icterus.  He has no adenopathy in the neck.  Lungs are clear bilaterally.  Cardiac exam regular rate and rhythm.  Abdomen is soft.  Extremities shows some symmetric weakness in upper lower extremities.  Neurological exam is none focal.  Again, we will have to see what Mr. Feuerborn's PET scan shows.  I think we are seeing a good response, that what we would may do is that we may consider giving just 2 days of treatment and not 3.  Even with dose reduction, he still having a tough time.  We will keep his appointments as scheduled.  If he does have any difficulties, I am sure his wife will let us know.  [name], MD

## 2022-06-11 NOTE — Patient Instructions (Signed)
Dehydration, Adult Dehydration is a condition in which there is not enough water or other fluids in the body. This happens when a person loses more fluids than he or she takes in. Important organs, such as the kidneys, brain, and heart, cannot function without a proper amount of fluids. Any loss of fluids from the body can lead to dehydration. Dehydration can be mild, moderate, or severe. It should be treated right away to prevent it from becoming severe. What are the causes? Dehydration may be caused by: Conditions that cause loss of water or other fluids, such as diarrhea, vomiting, or sweating or urinating a lot. Not drinking enough fluids, especially when you are ill or doing activities that require a lot of energy. Other illnesses and conditions, such as fever or infection. Certain medicines, such as medicines that remove excess fluid from the body (diuretics). Lack of safe drinking water. Not being able to get enough water and food. What increases the risk? The following factors may make you more likely to develop this condition: Having a long-term (chronic) illness that has not been treated properly, such as diabetes, heart disease, or kidney disease. Being 65 years of age or older. Having a disability. Living in a place that is high in altitude, where thinner, drier air causes more fluid loss. Doing exercises that put stress on your body for a long time (endurance sports). What are the signs or symptoms? Symptoms of dehydration depend on how severe it is. Mild or moderate dehydration Thirst. Dry lips or dry mouth. Dizziness or light-headedness, especially when standing up from a seated position. Muscle cramps. Dark urine. Urine may be the color of tea. Less urine or tears produced than usual. Headache. Severe dehydration Changes in skin. Your skin may be cold and clammy, blotchy, or pale. Your skin also may not return to normal after being lightly pinched and released. Little or  no tears, urine, or sweat. Changes in vital signs, such as rapid breathing and low blood pressure. Your pulse may be weak or may be faster than 100 beats a minute when you are sitting still. Other changes, such as: Feeling very thirsty. Sunken eyes. Cold hands and feet. Confusion. Being very tired (lethargic) or having trouble waking from sleep. Short-term weight loss. Loss of consciousness. How is this diagnosed? This condition is diagnosed based on your symptoms and a physical exam. You may have blood and urine tests to help confirm the diagnosis. How is this treated? Treatment for this condition depends on how severe it is. Treatment should be started right away. Do not wait until dehydration becomes severe. Severe dehydration is an emergency and needs to be treated in a hospital. Mild or moderate dehydration can be treated at home. You may be asked to: Drink more fluids. Drink an oral rehydration solution (ORS). This drink helps restore proper amounts of fluids and salts and minerals in the blood (electrolytes). Severe dehydration can be treated: With IV fluids. By correcting abnormal levels of electrolytes. This is often done by giving electrolytes through a tube that is passed through your nose and into your stomach (nasogastric tube, or NG tube). By treating the underlying cause of dehydration. Follow these instructions at home: Oral rehydration solution If told by your health care provider, drink an ORS: Make an ORS by following instructions on the package. Start by drinking small amounts, about  cup (120 mL) every 5-10 minutes. Slowly increase how much you drink until you have taken the amount recommended by your health   care provider. Eating and drinking        Drink enough clear fluid to keep your urine pale yellow. If you were told to drink an ORS, finish the ORS first and then start slowly drinking other clear fluids. Drink fluids such as: Water. Do not drink only  water. Doing that can lead to hyponatremia, which is having too little salt (sodium) in the body. Water from ice chips you suck on. Fruit juice that you have added water to (diluted fruit juice). Low-calorie sports drinks. Eat foods that contain a healthy balance of electrolytes, such as bananas, oranges, potatoes, tomatoes, and spinach. Do not drink alcohol. Avoid the following: Drinks that contain a lot of sugar. These include high-calorie sports drinks, fruit juice that is not diluted, and soda. Caffeine. Foods that are greasy or contain a lot of fat or sugar. General instructions Take over-the-counter and prescription medicines only as told by your health care provider. Do not take sodium tablets. Doing that can lead to having too much sodium in the body (hypernatremia). Return to your normal activities as told by your health care provider. Ask your health care provider what activities are safe for you. Keep all follow-up visits as told by your health care provider. This is important. Contact a health care provider if: You have muscle cramps, pain, or discomfort, such as: Pain in your abdomen and the pain gets worse or stays in one area (localizes). Stiff neck. You have a rash. You are more irritable than usual. You are sleepier or have a harder time waking than usual. You feel weak or dizzy. You feel very thirsty. Get help right away if you have: Any symptoms of severe dehydration. Symptoms of vomiting, such as: You cannot eat or drink without vomiting. Vomiting gets worse or does not go away. Vomit includes blood or green matter (bile). Symptoms that get worse with treatment. A fever. A severe headache. Problems with urination or bowel movements, such as: Diarrhea that gets worse or does not go away. Blood in your stool (feces). This may cause stool to look black and tarry. Not urinating, or urinating only a small amount of very dark urine, within 6-8 hours. Trouble  breathing. These symptoms may represent a serious problem that is an emergency. Do not wait to see if the symptoms will go away. Get medical help right away. Call your local emergency services (911 in the [place] Do not drive yourself to the hospital. Summary Dehydration is a condition in which there is not enough water or other fluids in the body. This happens when a person loses more fluids than he or she takes in. Treatment for this condition depends on how severe it is. Treatment should be started right away. Do not wait until dehydration becomes severe. Drink enough clear fluid to keep your urine pale yellow. If you were told to drink an oral rehydration solution (ORS), finish the ORS first and then start slowly drinking other clear fluids. Take over-the-counter and prescription medicines only as told by your health care provider. Get help right away if you have any symptoms of severe dehydration. This information is not intended to replace advice given to you by your health care provider. Make sure you discuss any questions you have with your health care provider. Document Revised: 09/30/2021 Document Reviewed: 01/04/2019 Elsevier Patient Education  2023 [employer]  

## 2022-06-11 NOTE — Patient Instructions (Signed)

## 2022-06-11 NOTE — Progress Notes (Signed)
Critical lab result received from [place] in lab. Pt ANC 0.4 Dr. Marin Olp aware and no new orders received.

## 2022-06-11 NOTE — Telephone Encounter (Signed)
This RN spoke to patient daughter and gave her all the instructions for her dad (patient) to be transfused at [facility]. Pt daughter aware that patient is to walk all the way back to the infusion center and go to nurses station. Pt daughter aware to not have patient sit in lobby as there will not be anyone to check patient in. Pt daughter given the number [phone] to call if weather is bad and they will be unable to make the appointment. Pt daughter aware that patient is to be wearing the blue blood bracelet. Pt daughter verbalized understanding and had no further questions.

## 2022-06-11 NOTE — Progress Notes (Signed)
Received a call from patient's wife. Patient is doing poorly at home. He's not drinking anything. This has been the case for 3 to 4 days. She's states he's getting weaker and it's harder for her and the family to care for him physically. She wants to know what to do.  Suggested that she bring him into the office for lab check and fluids. Otherwise I recommended that he be brought to the ED for assessment. She stated that her son was with her and could help bring the patient into the office. Appointment made. Dr Marin Olp notified.   Oncology Nurse Navigator Documentation     06/11/2022    8:45 AM  Oncology Nurse Navigator Flowsheets  Navigator Follow Up Date: 06/25/2022  Navigator Follow Up Reason: Scan Review  Navigator Location [facility]  Navigator Encounter Type Telephone  Telephone Symptom Mgt;Incoming Call  Patient Visit Type MedOnc  Treatment Phase Active Tx  Barriers/Navigation Needs Coordination of Care;Education  Education Pain/ Symptom Management  Interventions Coordination of Care;Psycho-Social Support  Acuity Level 2-Minimal Needs (1-2 Barriers Identified)  Coordination of Care Appts  Education Method Verbal  Support Groups/Services Friends and Family  Time Spent with Patient 15

## 2022-06-12 ENCOUNTER — Other Ambulatory Visit: Payer: Self-pay

## 2022-06-12 ENCOUNTER — Inpatient Hospital Stay: Payer: Medicare Other

## 2022-06-12 DIAGNOSIS — E860 Dehydration: Secondary | ICD-10-CM | POA: Diagnosis not present

## 2022-06-12 DIAGNOSIS — C3490 Malignant neoplasm of unspecified part of unspecified bronchus or lung: Secondary | ICD-10-CM | POA: Diagnosis not present

## 2022-06-12 DIAGNOSIS — D649 Anemia, unspecified: Secondary | ICD-10-CM

## 2022-06-12 DIAGNOSIS — Z79899 Other long term (current) drug therapy: Secondary | ICD-10-CM | POA: Diagnosis not present

## 2022-06-12 DIAGNOSIS — C7931 Secondary malignant neoplasm of brain: Secondary | ICD-10-CM | POA: Diagnosis not present

## 2022-06-12 DIAGNOSIS — D709 Neutropenia, unspecified: Secondary | ICD-10-CM | POA: Diagnosis not present

## 2022-06-12 MED ORDER — HEPARIN SOD (PORK) LOCK FLUSH 100 UNIT/ML IV SOLN
500.0000 [IU] | Freq: Every day | INTRAVENOUS | Status: AC | PRN
  Administered 2022-06-12: 500 [IU]

## 2022-06-12 MED ORDER — SODIUM CHLORIDE 0.9% FLUSH
10.0000 mL | INTRAVENOUS | Status: AC | PRN
  Administered 2022-06-12: 10 mL

## 2022-06-12 MED ORDER — SODIUM CHLORIDE 0.9% IV SOLUTION
250.0000 mL | Freq: Once | INTRAVENOUS | Status: AC
  Administered 2022-06-12: 250 mL via INTRAVENOUS

## 2022-06-12 MED ORDER — ACETAMINOPHEN 325 MG PO TABS
650.0000 mg | ORAL_TABLET | Freq: Once | ORAL | Status: AC
  Administered 2022-06-12: 650 mg via ORAL
  Filled 2022-06-12: qty 2

## 2022-06-12 MED ORDER — DIPHENHYDRAMINE HCL 25 MG PO CAPS
25.0000 mg | ORAL_CAPSULE | Freq: Once | ORAL | Status: AC
  Administered 2022-06-12: 25 mg via ORAL
  Filled 2022-06-12: qty 1

## 2022-06-12 NOTE — Patient Instructions (Signed)

## 2022-06-14 ENCOUNTER — Ambulatory Visit: Payer: Medicare Other | Admitting: Hematology & Oncology

## 2022-06-14 ENCOUNTER — Other Ambulatory Visit: Payer: Medicare Other

## 2022-06-14 ENCOUNTER — Ambulatory Visit: Payer: Medicare Other

## 2022-06-14 LAB — TYPE AND SCREEN
ABO/RH(D): O NEG
Antibody Screen: NEGATIVE
Unit division: 0

## 2022-06-14 LAB — BPAM RBC
Blood Product Expiration Date: 202401302359
ISSUE DATE / TIME: 202401060956
Unit Type and Rh: 9500

## 2022-06-15 ENCOUNTER — Ambulatory Visit: Payer: Medicare Other

## 2022-06-16 ENCOUNTER — Ambulatory Visit: Payer: Medicare Other

## 2022-06-18 ENCOUNTER — Ambulatory Visit: Payer: Medicare Other

## 2022-06-21 ENCOUNTER — Inpatient Hospital Stay: Payer: Medicare Other

## 2022-06-21 ENCOUNTER — Inpatient Hospital Stay: Payer: Medicare Other | Admitting: Hematology & Oncology

## 2022-06-22 ENCOUNTER — Encounter: Payer: Self-pay | Admitting: *Deleted

## 2022-06-22 ENCOUNTER — Inpatient Hospital Stay: Payer: Medicare Other

## 2022-06-22 VITALS — BP 109/67 | HR 101 | Temp 98.0°F | Resp 16

## 2022-06-22 DIAGNOSIS — C3491 Malignant neoplasm of unspecified part of right bronchus or lung: Secondary | ICD-10-CM

## 2022-06-22 DIAGNOSIS — D649 Anemia, unspecified: Secondary | ICD-10-CM

## 2022-06-22 DIAGNOSIS — C3490 Malignant neoplasm of unspecified part of unspecified bronchus or lung: Secondary | ICD-10-CM | POA: Diagnosis not present

## 2022-06-22 DIAGNOSIS — C778 Secondary and unspecified malignant neoplasm of lymph nodes of multiple regions: Secondary | ICD-10-CM

## 2022-06-22 DIAGNOSIS — E860 Dehydration: Secondary | ICD-10-CM | POA: Diagnosis not present

## 2022-06-22 DIAGNOSIS — C7931 Secondary malignant neoplasm of brain: Secondary | ICD-10-CM | POA: Diagnosis not present

## 2022-06-22 DIAGNOSIS — Z79899 Other long term (current) drug therapy: Secondary | ICD-10-CM | POA: Diagnosis not present

## 2022-06-22 DIAGNOSIS — D709 Neutropenia, unspecified: Secondary | ICD-10-CM | POA: Diagnosis not present

## 2022-06-22 LAB — CBC WITH DIFFERENTIAL (CANCER CENTER ONLY)
Abs Immature Granulocytes: 0.46 10*3/uL — ABNORMAL HIGH (ref 0.00–0.07)
Basophils Absolute: 0.1 10*3/uL (ref 0.0–0.1)
Basophils Relative: 1 %
Eosinophils Absolute: 0 10*3/uL (ref 0.0–0.5)
Eosinophils Relative: 0 %
HCT: 29.7 % — ABNORMAL LOW (ref 39.0–52.0)
Hemoglobin: 9.9 g/dL — ABNORMAL LOW (ref 13.0–17.0)
Immature Granulocytes: 3 %
Lymphocytes Relative: 11 %
Lymphs Abs: 1.7 10*3/uL (ref 0.7–4.0)
MCH: 31.1 pg (ref 26.0–34.0)
MCHC: 33.3 g/dL (ref 30.0–36.0)
MCV: 93.4 fL (ref 80.0–100.0)
Monocytes Absolute: 1.4 10*3/uL — ABNORMAL HIGH (ref 0.1–1.0)
Monocytes Relative: 9 %
Neutro Abs: 11.4 10*3/uL — ABNORMAL HIGH (ref 1.7–7.7)
Neutrophils Relative %: 76 %
Platelet Count: 429 10*3/uL — ABNORMAL HIGH (ref 150–400)
RBC: 3.18 MIL/uL — ABNORMAL LOW (ref 4.22–5.81)
RDW: 15.4 % (ref 11.5–15.5)
WBC Count: 15.1 10*3/uL — ABNORMAL HIGH (ref 4.0–10.5)
nRBC: 0 % (ref 0.0–0.2)

## 2022-06-22 LAB — CMP (CANCER CENTER ONLY)
ALT: 18 U/L (ref 0–44)
AST: 28 U/L (ref 15–41)
Albumin: 3.1 g/dL — ABNORMAL LOW (ref 3.5–5.0)
Alkaline Phosphatase: 55 U/L (ref 38–126)
Anion gap: 11 (ref 5–15)
BUN: 44 mg/dL — ABNORMAL HIGH (ref 8–23)
CO2: 22 mmol/L (ref 22–32)
Calcium: 8.7 mg/dL — ABNORMAL LOW (ref 8.9–10.3)
Chloride: 100 mmol/L (ref 98–111)
Creatinine: 0.83 mg/dL (ref 0.61–1.24)
GFR, Estimated: >60 mL/min (ref >60)
Glucose, Bld: 141 mg/dL — ABNORMAL HIGH (ref 70–99)
Potassium: 3.8 mmol/L (ref 3.5–5.1)
Sodium: 133 mmol/L — ABNORMAL LOW (ref 135–145)
Total Bilirubin: 0.4 mg/dL (ref 0.3–1.2)
Total Protein: 6.4 g/dL — ABNORMAL LOW (ref 6.5–8.1)

## 2022-06-22 LAB — SAMPLE TO BLOOD BANK

## 2022-06-22 MED ORDER — HEPARIN SOD (PORK) LOCK FLUSH 100 UNIT/ML IV SOLN
500.0000 [IU] | Freq: Once | INTRAVENOUS | Status: AC
  Administered 2022-06-22: 500 [IU] via INTRAVENOUS

## 2022-06-22 MED ORDER — SODIUM CHLORIDE 0.9% FLUSH
10.0000 mL | INTRAVENOUS | Status: DC | PRN
  Administered 2022-06-22: 10 mL via INTRAVENOUS

## 2022-06-22 MED ORDER — SODIUM CHLORIDE 0.9 % IV SOLN: INTRAVENOUS | Status: DC

## 2022-06-22 NOTE — Patient Instructions (Signed)
Dehydration, Adult Dehydration is condition in which there is not enough water or other fluids in the body. This happens when a person loses more fluids than he or she takes in. Important body parts cannot work right without the right amount of fluids. Any loss of fluids from the body can cause dehydration. Dehydration can be mild, worse, or very bad. It should be treated right away to keep it from getting very bad. What are the causes? This condition may be caused by: Conditions that cause loss of water or other fluids, such as: Watery poop (diarrhea). Vomiting. Sweating a lot. Peeing (urinating) a lot. Not drinking enough fluids, especially when you: Are ill. Are doing things that take a lot of energy to do. Other illnesses and conditions, such as fever or infection. Certain medicines, such as medicines that take extra fluid out of the body (diuretics). Lack of safe drinking water. Not being able to get enough water and food. What increases the risk? The following factors may make you more likely to develop this condition: Having a long-term (chronic) illness that has not been treated the right way, such as: Diabetes. Heart disease. Kidney disease. Being 83 years of age or older. Having a disability. Living in a place that is high above the ground or sea (high in altitude). The thinner, dried air causes more fluid loss. Doing exercises that put stress on your body for a long time. What are the signs or symptoms? Symptoms of dehydration depend on how bad it is. Mild or worse dehydration Thirst. Dry lips or dry mouth. Feeling dizzy or light-headed, especially when you stand up from sitting. Muscle cramps. Your body making: Dark pee (urine). Pee may be the color of tea. Less pee than normal. Less tears than normal. Headache. Very bad dehydration Changes in skin. Skin may: Be cold to the touch (clammy). Be blotchy or pale. Not go back to normal right after you lightly pinch  it and let it go. Little or no tears, pee, or sweat. Changes in vital signs, such as: Fast breathing. Low blood pressure. Weak pulse. Pulse that is more than 100 beats a minute when you are sitting still. Other changes, such as: Feeling very thirsty. Eyes that look hollow (sunken). Cold hands and feet. Being mixed up (confused). Being very tired (lethargic) or having trouble waking from sleep. Short-term weight loss. Loss of consciousness. How is this treated? Treatment for this condition depends on how bad it is. Treatment should start right away. Do not wait until your condition gets very bad. Very bad dehydration is an emergency. You will need to go to a hospital. Mild or worse dehydration can be treated at home. You may be asked to: Drink more fluids. Drink an oral rehydration solution (ORS). This drink helps get the right amounts of fluids and salts and minerals in the blood (electrolytes). Very bad dehydration can be treated: With fluids through an IV tube. By getting normal levels of salts and minerals in your blood. This is often done by giving salts and minerals through a tube. The tube is passed through your nose and into your stomach. By treating the root cause. Follow these instructions at home: Oral rehydration solution If told by your doctor, drink an ORS: Make an ORS. Use instructions on the package. Start by drinking small amounts, about  cup (120 mL) every 5-10 minutes. Slowly drink more until you have had the amount that your doctor said to have. Eating and drinking          Drink enough clear fluid to keep your pee pale yellow. If you were told to drink an ORS, finish the ORS first. Then, start slowly drinking other clear fluids. Drink fluids such as: Water. Do not drink only water. Doing that can make the salt (sodium) level in your body get too low. Water from ice chips you suck on. Fruit juice that you have added water to (diluted). Low-calorie sports  drinks. Eat foods that have the right amounts of salts and minerals, such as: Bananas. Oranges. Potatoes. Tomatoes. Spinach. Do not drink alcohol. Avoid: Drinks that have a lot of sugar. These include: High-calorie sports drinks. Fruit juice that you did not add water to. Soda. Caffeine. Foods that are greasy or have a lot of fat or sugar. General instructions Take over-the-counter and prescription medicines only as told by your doctor. Do not take salt tablets. Doing that can make the salt level in your body get too high. Return to your normal activities as told by your doctor. Ask your doctor what activities are safe for you. Keep all follow-up visits as told by your doctor. This is important. Contact a doctor if: You have pain in your belly (abdomen) and the pain: Gets worse. Stays in one place. You have a rash. You have a stiff neck. You get angry or annoyed (irritable) more easily than normal. You are more tired or have a harder time waking than normal. You feel: Weak or dizzy. Very thirsty. Get help right away if you have: Any symptoms of very bad dehydration. Symptoms of vomiting, such as: You cannot eat or drink without vomiting. Your vomiting gets worse or does not go away. Your vomit has blood or green stuff in it. Symptoms that get worse with treatment. A fever. A very bad headache. Problems with peeing or pooping (having a bowel movement), such as: Watery poop that gets worse or does not go away. Blood in your poop (stool). This may cause poop to look black and tarry. Not peeing in 6-8 hours. Peeing only a small amount of very dark pee in 6-8 hours. Trouble breathing. These symptoms may be an emergency. Do not wait to see if the symptoms will go away. Get medical help right away. Call your local emergency services (911 in the [place] Do not drive yourself to the hospital. Summary Dehydration is a condition in which there is not enough water or other fluids  in the body. This happens when a person loses more fluids than he or she takes in. Treatment for this condition depends on how bad it is. Treatment should be started right away. Do not wait until your condition gets very bad. Drink enough clear fluid to keep your pee pale yellow. If you were told to drink an oral rehydration solution (ORS), finish the ORS first. Then, start slowly drinking other clear fluids. Take over-the-counter and prescription medicines only as told by your doctor. Get help right away if you have any symptoms of very bad dehydration. This information is not intended to replace advice given to you by your health care provider. Make sure you discuss any questions you have with your health care provider. Document Revised: 09/30/2021 Document Reviewed: 01/04/2019 Elsevier Patient Education  2023 Elsevier Inc.  

## 2022-06-22 NOTE — Progress Notes (Signed)
Patient continues to have side effects. Harry Brown called and spoke to me about his symptoms. He is not eating or drinking. He is extremely weak. He has moments of confusion or disorientation. These are worse at night. Patient had similar symptoms on 06/11/2022 and was brought in for IVF and later a transfusion. She believes that his condition improved after this, but only for a few days.   Spoke to Dr Myna Hidalgo. He would like patient to come in for IVF and lab check. Harry Brown thinks she can bring patient into the office by 2p today. Appointment made.   Oncology Nurse Navigator Documentation     06/22/2022   11:30 AM  Oncology Nurse Navigator Flowsheets  Navigator Follow Up Date: 06/25/2022  Navigator Follow Up Reason: Scan Review  Navigator Location [facility]  Navigator Encounter Type Telephone  Telephone Symptom Mgt;Incoming Call  Patient Visit Type MedOnc  Treatment Phase Active Tx  Barriers/Navigation Needs Coordination of Care;Education  Education Pain/ Symptom Management  Interventions Coordination of Care;Psycho-Social Support  Acuity Level 2-Minimal Needs (1-2 Barriers Identified)  Coordination of Care Appts  Education Method Verbal  Support Groups/Services Friends and Family  Time Spent with Patient 30

## 2022-06-23 ENCOUNTER — Inpatient Hospital Stay: Payer: Medicare Other

## 2022-06-23 ENCOUNTER — Inpatient Hospital Stay: Payer: Medicare Other | Admitting: Dietician

## 2022-06-23 NOTE — Progress Notes (Signed)
Nutrition Follow Up: Called patient and wife at home telephone number.  Appetite fair.  Marinol was increased to 58m BID.  Although he sometimes sleeps through supper and doesn't always get his second dose of Marinol. .  Patient is very weak, dehydrated received IVF yesterday and still feeling weak.  He fell asleep at end of consult when JBethena Royswas relaying her recent strategies to increase his fluids.   Bowel movement every 2-3 days Ate a very good breakfast the other day, eggs grits bacon usual breakfast is just Ensure Max. Kielbasa, peas, mac and cheese  but he only ate a few bites.   He is using 1-3 Ensure Max per day.   Labs: 06/22/22  WBC 15.1, Hgb 9.9, BUN 44     Anthropometrics: Weight stable past month   Height: 72" Weight:  06/11/22  167# 06/01/22  166# 05/25/22  164# 05/13/22  164.7# 04/13/22  180.4# (at final radiation [place])   UBW: 190-195 BMI: 22.65     Estimated Energy Needs   Kcals: 2400-2800 Protein: 97-122 g Fluid: 3 L     NUTRITION DIAGNOSIS:  Inadequate PO intake calories r/t decreased portions and poor appetite AEB weight loss past 6 months and dietary recall of intake trends. Resolving with weight stable past month but inadequate PO intake fluids continues.   Severe malnutrition in context of acute illness/injury    INTERVENTION:    Encouraged trial of fruit cups packed in juice, sugar free jellos, using warmed Ensure max like a hot chocolate on cold days, soups that are easier to drink down in a mug.  Again, had patient reflect on how much better he felt after IVF to motivate to continue to increased his PO fluids.   Encouraged making milkshakes with Ensure Max and increasing to 3-4 daily.    Trial taking Marinol with breakfast and lunch.      MONITORING, EVALUATION, GOAL: weight trends, nutrition impact symptoms, PO intake, labs     Next Visit: Remote in two weeks   CApril Manson RDN, LDN Registered Dietitian, [facility](Usual office hours: Tuesday-Thursday) Mobile: [phone]

## 2022-06-24 ENCOUNTER — Inpatient Hospital Stay: Payer: Medicare Other

## 2022-06-25 ENCOUNTER — Encounter (HOSPITAL_COMMUNITY)
Admission: RE | Admit: 2022-06-25 | Discharge: 2022-06-25 | Disposition: A | Discharge status: Home / Self Care | Payer: Medicare Other | Source: Ambulatory Visit | Attending: Hematology & Oncology | Admitting: Hematology & Oncology

## 2022-06-25 ENCOUNTER — Other Ambulatory Visit: Payer: Self-pay | Admitting: Family Medicine

## 2022-06-25 DIAGNOSIS — C3491 Malignant neoplasm of unspecified part of right bronchus or lung: Secondary | ICD-10-CM | POA: Diagnosis not present

## 2022-06-25 DIAGNOSIS — J90 Pleural effusion, not elsewhere classified: Secondary | ICD-10-CM | POA: Diagnosis not present

## 2022-06-25 DIAGNOSIS — J984 Other disorders of lung: Secondary | ICD-10-CM | POA: Diagnosis not present

## 2022-06-25 DIAGNOSIS — C3490 Malignant neoplasm of unspecified part of unspecified bronchus or lung: Secondary | ICD-10-CM | POA: Diagnosis not present

## 2022-06-25 DIAGNOSIS — N2889 Other specified disorders of kidney and ureter: Secondary | ICD-10-CM | POA: Diagnosis not present

## 2022-06-25 DIAGNOSIS — C7931 Secondary malignant neoplasm of brain: Secondary | ICD-10-CM | POA: Insufficient documentation

## 2022-06-25 LAB — GLUCOSE, CAPILLARY: Glucose-Capillary: 125 mg/dL — ABNORMAL HIGH (ref 70–99)

## 2022-06-25 MED ORDER — FLUDEOXYGLUCOSE F - 18 (FDG) INJECTION
8.3300 | Freq: Once | INTRAVENOUS | Status: AC
  Administered 2022-06-25: 8.33 via INTRAVENOUS

## 2022-06-28 ENCOUNTER — Inpatient Hospital Stay: Payer: Medicare Other

## 2022-06-28 ENCOUNTER — Encounter: Payer: Self-pay | Admitting: *Deleted

## 2022-06-28 ENCOUNTER — Other Ambulatory Visit: Payer: Self-pay

## 2022-06-28 ENCOUNTER — Encounter: Payer: Self-pay | Admitting: Hematology & Oncology

## 2022-06-28 ENCOUNTER — Inpatient Hospital Stay: Payer: Medicare Other | Admitting: Hematology & Oncology

## 2022-06-28 VITALS — BP 136/61 | HR 92 | Resp 16

## 2022-06-28 VITALS — BP 114/81 | HR 101 | Temp 98.0°F | Wt 158.0 lb

## 2022-06-28 DIAGNOSIS — C778 Secondary and unspecified malignant neoplasm of lymph nodes of multiple regions: Secondary | ICD-10-CM

## 2022-06-28 DIAGNOSIS — C3491 Malignant neoplasm of unspecified part of right bronchus or lung: Secondary | ICD-10-CM | POA: Diagnosis not present

## 2022-06-28 DIAGNOSIS — Z79899 Other long term (current) drug therapy: Secondary | ICD-10-CM | POA: Diagnosis not present

## 2022-06-28 DIAGNOSIS — D709 Neutropenia, unspecified: Secondary | ICD-10-CM | POA: Diagnosis not present

## 2022-06-28 DIAGNOSIS — E860 Dehydration: Secondary | ICD-10-CM

## 2022-06-28 DIAGNOSIS — C3490 Malignant neoplasm of unspecified part of unspecified bronchus or lung: Secondary | ICD-10-CM | POA: Diagnosis not present

## 2022-06-28 DIAGNOSIS — C7931 Secondary malignant neoplasm of brain: Secondary | ICD-10-CM | POA: Diagnosis not present

## 2022-06-28 DIAGNOSIS — D649 Anemia, unspecified: Secondary | ICD-10-CM

## 2022-06-28 LAB — CBC WITH DIFFERENTIAL (CANCER CENTER ONLY)
Abs Immature Granulocytes: 0.41 10*3/uL — ABNORMAL HIGH (ref 0.00–0.07)
Basophils Absolute: 0.1 10*3/uL (ref 0.0–0.1)
Basophils Relative: 1 %
Eosinophils Absolute: 0 10*3/uL (ref 0.0–0.5)
Eosinophils Relative: 0 %
HCT: 28.9 % — ABNORMAL LOW (ref 39.0–52.0)
Hemoglobin: 9.5 g/dL — ABNORMAL LOW (ref 13.0–17.0)
Immature Granulocytes: 3 %
Lymphocytes Relative: 10 %
Lymphs Abs: 1.3 10*3/uL (ref 0.7–4.0)
MCH: 31.4 pg (ref 26.0–34.0)
MCHC: 32.9 g/dL (ref 30.0–36.0)
MCV: 95.4 fL (ref 80.0–100.0)
Monocytes Absolute: 1.3 10*3/uL — ABNORMAL HIGH (ref 0.1–1.0)
Monocytes Relative: 10 %
Neutro Abs: 9.8 10*3/uL — ABNORMAL HIGH (ref 1.7–7.7)
Neutrophils Relative %: 76 %
Platelet Count: 327 10*3/uL (ref 150–400)
RBC: 3.03 MIL/uL — ABNORMAL LOW (ref 4.22–5.81)
RDW: 16.2 % — ABNORMAL HIGH (ref 11.5–15.5)
WBC Count: 12.9 10*3/uL — ABNORMAL HIGH (ref 4.0–10.5)
nRBC: 0 % (ref 0.0–0.2)

## 2022-06-28 LAB — LACTATE DEHYDROGENASE: LDH: 139 U/L (ref 98–192)

## 2022-06-28 LAB — PREALBUMIN: Prealbumin: 15 mg/dL — ABNORMAL LOW (ref 18–38)

## 2022-06-28 LAB — CMP (CANCER CENTER ONLY)
ALT: 26 U/L (ref 0–44)
AST: 26 U/L (ref 15–41)
Albumin: 3.7 g/dL (ref 3.5–5.0)
Alkaline Phosphatase: 54 U/L (ref 38–126)
Anion gap: 12 (ref 5–15)
BUN: 46 mg/dL — ABNORMAL HIGH (ref 8–23)
CO2: 23 mmol/L (ref 22–32)
Calcium: 9.7 mg/dL (ref 8.9–10.3)
Chloride: 103 mmol/L (ref 98–111)
Creatinine: 0.85 mg/dL (ref 0.61–1.24)
GFR, Estimated: >60 mL/min (ref >60)
Glucose, Bld: 144 mg/dL — ABNORMAL HIGH (ref 70–99)
Potassium: 4 mmol/L (ref 3.5–5.1)
Sodium: 138 mmol/L (ref 135–145)
Total Bilirubin: 0.6 mg/dL (ref 0.3–1.2)
Total Protein: 6.7 g/dL (ref 6.5–8.1)

## 2022-06-28 MED ORDER — SODIUM CHLORIDE 0.9 % IV SOLN: INTRAVENOUS | Status: DC

## 2022-06-28 MED ORDER — SODIUM CHLORIDE 0.9% FLUSH
10.0000 mL | INTRAVENOUS | Status: DC | PRN
  Administered 2022-06-28: 10 mL via INTRAVENOUS

## 2022-06-28 MED ORDER — HEPARIN SOD (PORK) LOCK FLUSH 100 UNIT/ML IV SOLN
500.0000 [IU] | Freq: Once | INTRAVENOUS | Status: AC
  Administered 2022-06-28: 500 [IU] via INTRAVENOUS

## 2022-06-28 MED ORDER — MEGESTROL ACETATE 400 MG/10ML PO SUSP: 400.0000 mg | Freq: Two times a day (BID) | ORAL | 4 refills | Status: AC

## 2022-06-28 NOTE — Progress Notes (Signed)
Hematology and Oncology Follow Up Visit  Harry Brown [id number] November 20, 1940 82 y.o. 06/28/2022   Principle Diagnosis:  Extensive stage small cell lung cancer-CNS metastasis  Current Therapy:   Cranial radiotherapy --completed on 04/13/2022 Carboplatin/VP-16/Tecentriq -- s/p cycle #1 -- start on 05/03/2022     Interim History:  Harry Brown is back for follow-up.  Unfortunately, he just is not doing all that well.  He really has had a hard time with treatment.  We did do a PET scan on him.  The PET scan was done on 06/25/2022.  The PET scan did show that he was responding nicely.  He had a nice improvement with respect to the mediastinal and hilar nodes.  He has resolution of pulmonary nodules.  Unfortunately, I does do not think he can tolerate treatment right now.  He comes in a wheelchair.  He is not eating.  Marinol is not helping.  We will have to try him on Megace elixir.  He is on Eliquis because of atrial fibrillation.  Hopefully this will help with respect to DVT prophylaxis.  He just is weak.  He is not eating or drinking.  He is little dehydrated.  I had a long talk with he and his family.  Despite the fact that we have a protocol that is working, I just think his quality of life is suffering too much right now.  I think he needs a nice break from treatment.  Hopefully, we will see that he stays responding for a while.  He has had no problems with diarrhea.  He is on MiraLAX.  He has had no problems with pain.  There is some pain in the lower back.  Overall, I would have to say that his performance status is probably ECOG 3.    Medications:  Current Outpatient Medications:    apixaban (ELIQUIS) 2.5 MG TABS tablet, Take 1 tablet (2.5 mg total) by mouth 2 (two) times daily., Disp: 60 tablet, Rfl: 6   atenolol (TENORMIN) 25 MG tablet, TAKE ONE (1) TABLET BY MOUTH EVERY DAY (Patient taking differently: Take 25 mg by mouth daily.), Disp: 90 tablet, Rfl: 0   atorvastatin  (LIPITOR) 40 MG tablet, TAKE ONE (1) TABLET BY MOUTH EACH DAY AT6PM, Disp: 90 tablet, Rfl: 1   Cholecalciferol (VITAMIN D) 50 MCG (2000 UT) tablet, Take 2,000 Units by mouth daily. , Disp: , Rfl:    dexamethasone (DECADRON) 4 MG tablet, Take 2 tabs by mouth starting day after last dose of etoposide for one day only. Repeat every 21 days. Take with food., Disp: 8 tablet, Rfl: 0   dronabinol (MARINOL) 2.5 MG capsule, Take 2 capsules (5 mg total) by mouth 2 (two) times daily before lunch and supper., Disp: 120 capsule, Rfl: 0   latanoprost (XALATAN) 0.005 % ophthalmic solution, Place 1 drop into both eyes at bedtime. , Disp: , Rfl:    levothyroxine (SYNTHROID) 75 MCG tablet, Take 1 tablet (75 mcg total) by mouth daily before breakfast., Disp: 90 tablet, Rfl: 0   lidocaine-prilocaine (EMLA) cream, Apply 1 Application topically as needed., Disp: 30 g, Rfl: 3   loratadine (CLARITIN) 10 MG tablet, Take 10 mg by mouth daily as needed for allergies., Disp: , Rfl:    metFORMIN (GLUCOPHAGE-XR) 500 MG 24 hr tablet, TAKE TWO (2) TABLETS BY MOUTH EVERY MORNING WITH BREAKFAST, Disp: 180 tablet, Rfl: 1   ondansetron (ZOFRAN) 8 MG tablet, Take 1 tablet (8 mg total) by mouth every 8 (eight) hours  as needed for nausea, vomiting or refractory nausea / vomiting. Start on the third day after carboplatin., Disp: 30 tablet, Rfl: 1   ONETOUCH DELICA LANCETS 33G MISC, USE TO CHECK BLOOD SUGAR ONCE DAILY, Disp: 100 each, Rfl: 1   ONETOUCH ULTRA test strip, USE 1 STRIP DAILY, Disp: 100 each, Rfl: 12   prochlorperazine (COMPAZINE) 10 MG tablet, Take 1 tablet (10 mg total) by mouth every 6 (six) hours as needed for nausea or vomiting., Disp: 30 tablet, Rfl: 1   tamsulosin (FLOMAX) 0.4 MG CAPS capsule, TAKE ONE CAPSULE BY MOUTH DAILY, Disp: 90 capsule, Rfl: 1   vitamin B-12 (CYANOCOBALAMIN) 1000 MCG tablet, Take 1,000 mcg by mouth daily., Disp: , Rfl:    nitroGLYCERIN (NITROSTAT) 0.4 MG SL tablet, Place 1 tablet (0.4 mg total)  under the tongue every 5 (five) minutes as needed for chest pain., Disp: 25 tablet, Rfl: 11  Allergies:  Allergies  Allergen Reactions   Ativan [Lorazepam] Other (See Comments)    Proloneged lethargy   Adhesive [Tape] Rash   Latex Rash   Other Hives, Swelling and Rash    Plastic shopping bags Metal staples: rash, swelling    Past Medical History, Surgical history, Social history, and Family History were reviewed and updated.  Review of Systems: Review of Systems  Constitutional: Negative.   HENT:  Negative.    Eyes: Negative.   Respiratory: Negative.    Cardiovascular: Negative.   Gastrointestinal: Negative.   Endocrine: Negative.   Genitourinary: Negative.    Musculoskeletal: Negative.   Skin: Negative.   Neurological: Negative.   Hematological: Negative.   Psychiatric/Behavioral: Negative.      Physical Exam:  weight is 158 lb (71.7 kg). His oral temperature is 98 F (36.7 C). His blood pressure is 114/81 and his pulse is 101 (abnormal). His oxygen saturation is 96%.   Wt Readings from Last 3 Encounters:  06/28/22 158 lb (71.7 kg)  06/11/22 167 lb (75.8 kg)  06/01/22 166 lb (75.3 kg)    Physical Exam Vitals reviewed.  HENT:     Head: Normocephalic and atraumatic.  Eyes:     Pupils: Pupils are equal, round, and reactive to light.  Cardiovascular:     Rate and Rhythm: Normal rate and regular rhythm.     Heart sounds: Normal heart sounds.  Pulmonary:     Effort: Pulmonary effort is normal.     Breath sounds: Normal breath sounds.  Abdominal:     General: Bowel sounds are normal.     Palpations: Abdomen is soft.  Musculoskeletal:        General: No tenderness or deformity. Normal range of motion.     Cervical back: Normal range of motion.  Lymphadenopathy:     Cervical: No cervical adenopathy.  Skin:    General: Skin is warm and dry.     Findings: No erythema or rash.  Neurological:     Mental Status: He is alert and oriented to person, place, and  time.  Psychiatric:        Behavior: Behavior normal.        Thought Content: Thought content normal.        Judgment: Judgment normal.     Lab Results  Component Value Date   WBC 12.9 (H) 06/28/2022   HGB 9.5 (L) 06/28/2022   HCT 28.9 (L) 06/28/2022   MCV 95.4 06/28/2022   PLT 327 06/28/2022     Chemistry      Component Value Date/Time  NA 138 06/28/2022 0815   NA 139 02/04/2022 0921   K 4.0 06/28/2022 0815   CL 103 06/28/2022 0815   CO2 23 06/28/2022 0815   BUN 46 (H) 06/28/2022 0815   BUN 21 02/04/2022 0921   CREATININE 0.85 06/28/2022 0815   CREATININE 1.16 (H) 04/03/2020 1044      Component Value Date/Time   CALCIUM 9.7 06/28/2022 0815   ALKPHOS 54 06/28/2022 0815   AST 26 06/28/2022 0815   ALT 26 06/28/2022 0815   BILITOT 0.6 06/28/2022 0815      Impression and Plan: Harry Brown is a very nice 82 year old white male.  He has extensive stage small cell lung cancer.  However, the extensive stage is only with brain metastasis.  Again, we will hold on treatment right now.  I think that if we do get back to treatment, then I probably give him 2 days instead of 3 days of etoposide.  Maybe, this could make a difference.  I want to give him a chance.  I would like to try to give him some quantity of life.  However, for right now, his quality of life is suffering.  He really has no quality of life.  He just does not do anything because he is so weak.  We will go ahead and give him some IV fluid today.  Maybe, the Megace will help with the appetite and he will get stronger.  I would like to see him back in about 3 weeks.  We will reassess him at that time for further therapy.  He knows that his family can give [place] a call if he has any problems between now and then.    Josph Macho, MD 1/22/20248:55 AM

## 2022-06-28 NOTE — Patient Instructions (Signed)

## 2022-06-28 NOTE — Patient Instructions (Signed)
Dehydration, Adult Dehydration is a condition in which there is not enough water or other fluids in the body. This happens when a person loses more fluids than he or she takes in. Important organs, such as the kidneys, brain, and heart, cannot function without a proper amount of fluids. Any loss of fluids from the body can lead to dehydration. Dehydration can be mild, moderate, or severe. It should be treated right away to prevent it from becoming severe. What are the causes? Dehydration may be caused by: Conditions that cause loss of water or other fluids, such as diarrhea, vomiting, or sweating or urinating a lot. Not drinking enough fluids, especially when you are ill or doing activities that require a lot of energy. Other illnesses and conditions, such as fever or infection. Certain medicines, such as medicines that remove excess fluid from the body (diuretics). Lack of safe drinking water. Not being able to get enough water and food. What increases the risk? The following factors may make you more likely to develop this condition: Having a long-term (chronic) illness that has not been treated properly, such as diabetes, heart disease, or kidney disease. Being 65 years of age or older. Having a disability. Living in a place that is high in altitude, where thinner, drier air causes more fluid loss. Doing exercises that put stress on your body for a long time (endurance sports). What are the signs or symptoms? Symptoms of dehydration depend on how severe it is. Mild or moderate dehydration Thirst. Dry lips or dry mouth. Dizziness or light-headedness, especially when standing up from a seated position. Muscle cramps. Dark urine. Urine may be the color of tea. Less urine or tears produced than usual. Headache. Severe dehydration Changes in skin. Your skin may be cold and clammy, blotchy, or pale. Your skin also may not return to normal after being lightly pinched and released. Little or  no tears, urine, or sweat. Changes in vital signs, such as rapid breathing and low blood pressure. Your pulse may be weak or may be faster than 100 beats a minute when you are sitting still. Other changes, such as: Feeling very thirsty. Sunken eyes. Cold hands and feet. Confusion. Being very tired (lethargic) or having trouble waking from sleep. Short-term weight loss. Loss of consciousness. How is this diagnosed? This condition is diagnosed based on your symptoms and a physical exam. You may have blood and urine tests to help confirm the diagnosis. How is this treated? Treatment for this condition depends on how severe it is. Treatment should be started right away. Do not wait until dehydration becomes severe. Severe dehydration is an emergency and needs to be treated in a hospital. Mild or moderate dehydration can be treated at home. You may be asked to: Drink more fluids. Drink an oral rehydration solution (ORS). This drink helps restore proper amounts of fluids and salts and minerals in the blood (electrolytes). Severe dehydration can be treated: With IV fluids. By correcting abnormal levels of electrolytes. This is often done by giving electrolytes through a tube that is passed through your nose and into your stomach (nasogastric tube, or NG tube). By treating the underlying cause of dehydration. Follow these instructions at home: Oral rehydration solution If told by your health care provider, drink an ORS: Make an ORS by following instructions on the package. Start by drinking small amounts, about  cup (120 mL) every 5-10 minutes. Slowly increase how much you drink until you have taken the amount recommended by your health   care provider. Eating and drinking        Drink enough clear fluid to keep your urine pale yellow. If you were told to drink an ORS, finish the ORS first and then start slowly drinking other clear fluids. Drink fluids such as: Water. Do not drink only  water. Doing that can lead to hyponatremia, which is having too little salt (sodium) in the body. Water from ice chips you suck on. Fruit juice that you have added water to (diluted fruit juice). Low-calorie sports drinks. Eat foods that contain a healthy balance of electrolytes, such as bananas, oranges, potatoes, tomatoes, and spinach. Do not drink alcohol. Avoid the following: Drinks that contain a lot of sugar. These include high-calorie sports drinks, fruit juice that is not diluted, and soda. Caffeine. Foods that are greasy or contain a lot of fat or sugar. General instructions Take over-the-counter and prescription medicines only as told by your health care provider. Do not take sodium tablets. Doing that can lead to having too much sodium in the body (hypernatremia). Return to your normal activities as told by your health care provider. Ask your health care provider what activities are safe for you. Keep all follow-up visits as told by your health care provider. This is important. Contact a health care provider if: You have muscle cramps, pain, or discomfort, such as: Pain in your abdomen and the pain gets worse or stays in one area (localizes). Stiff neck. You have a rash. You are more irritable than usual. You are sleepier or have a harder time waking than usual. You feel weak or dizzy. You feel very thirsty. Get help right away if you have: Any symptoms of severe dehydration. Symptoms of vomiting, such as: You cannot eat or drink without vomiting. Vomiting gets worse or does not go away. Vomit includes blood or green matter (bile). Symptoms that get worse with treatment. A fever. A severe headache. Problems with urination or bowel movements, such as: Diarrhea that gets worse or does not go away. Blood in your stool (feces). This may cause stool to look black and tarry. Not urinating, or urinating only a small amount of very dark urine, within 6-8 hours. Trouble  breathing. These symptoms may represent a serious problem that is an emergency. Do not wait to see if the symptoms will go away. Get medical help right away. Call your local emergency services (911 in the [place] Do not drive yourself to the hospital. Summary Dehydration is a condition in which there is not enough water or other fluids in the body. This happens when a person loses more fluids than he or she takes in. Treatment for this condition depends on how severe it is. Treatment should be started right away. Do not wait until dehydration becomes severe. Drink enough clear fluid to keep your urine pale yellow. If you were told to drink an oral rehydration solution (ORS), finish the ORS first and then start slowly drinking other clear fluids. Take over-the-counter and prescription medicines only as told by your health care provider. Get help right away if you have any symptoms of severe dehydration. This information is not intended to replace advice given to you by your health care provider. Make sure you discuss any questions you have with your health care provider. Document Revised: 09/30/2021 Document Reviewed: 01/04/2019 Elsevier Patient Education  2023 [employer]  

## 2022-06-29 ENCOUNTER — Encounter: Payer: Self-pay | Admitting: *Deleted

## 2022-06-29 ENCOUNTER — Inpatient Hospital Stay: Payer: Medicare Other

## 2022-06-29 NOTE — Progress Notes (Signed)
Reviewed patient's PET which shows improvement, however patient was too weak to continue on treatment. At this time patient's treatment will be placed in hold indefinitely due to patient performance status and intolerability. If patient condition improves, treatment will be modified.   Oncology Nurse Navigator Documentation     06/29/2022    7:45 AM  Oncology Nurse Navigator Flowsheets  Navigator Follow Up Date: 07/21/2022  Navigator Follow Up Reason: Follow-up Appointment  Navigator Location [facility]  Navigator Encounter Type Scan Review;Appt/Treatment Plan Review  Patient Visit Type MedOnc  Treatment Phase Active Tx  Barriers/Navigation Needs Coordination of Care;Education  Interventions None Required  Acuity Level 2-Minimal Needs (1-2 Barriers Identified)  Support Groups/Services Friends and Family  Time Spent with Patient 15

## 2022-06-30 ENCOUNTER — Encounter: Payer: Self-pay | Admitting: Hematology & Oncology

## 2022-06-30 ENCOUNTER — Other Ambulatory Visit: Payer: Self-pay

## 2022-06-30 ENCOUNTER — Inpatient Hospital Stay: Payer: Medicare Other

## 2022-07-01 ENCOUNTER — Inpatient Hospital Stay: Payer: Medicare Other

## 2022-07-07 ENCOUNTER — Inpatient Hospital Stay: Payer: Medicare Other | Admitting: Dietician

## 2022-07-07 NOTE — Progress Notes (Signed)
Nutrition Follow Up: Called patient and wife at home telephone number. Patient reports thinks Reather Laurence is working "a little bit better."  He has only been taking for past week.  Treatment has been held and he follows up w/Dr. Arelia Sneddon next month.  Patient is still very weak, although wife reports not sleeping as much. Patient eats more when less if offered on plate.  Patient couldn't think of any favorite foods he might want to try.  Wife reports since last time she offered English peas and he ate once but refused next time.    Breakfast:  only an Ensure most days Will offer leftovers if he enjoyed them previously Pork chop, creamed potato, fresh corn (few more bites)   He is using 2-3 Ensure Max per day, Milk shake made with protein powder, little Pepsi, little coffee.    Labs: 06/28/22  WBC 12.9  BUN 44 Hgb 9.5      Anthropometrics: Weight loss 9#(5.3%) past month continues to be significant   Height: 72" Weight:  06/28/22  158# 06/11/22  167# 06/01/22  166# 05/25/22  164# 05/13/22  164.7# 04/13/22  180.4# (at final radiation UT)   UBW: 190-195 BMI: 21.43     Estimated Energy Needs   Kcals: 2200-2500 Protein: 86-108 g Fluid: 2.5 L     NUTRITION DIAGNOSIS:  Inadequate PO intake calories r/t decreased portions and poor appetite AEB weight loss past 6 months and dietary recall of intake trends.  Inadequate PO intake calories and fluids continues.   Severe malnutrition in context of acute illness/injury    INTERVENTION:    Encouraged extra gravies, added fats and sauces.  Keep shelf stable snacks (pudding and fruit cups near seat). Encourage often   Encouraged continued use of milkshakes daily and  Ensure Max 3-4 daily daily with meal to help wash down foods.       MONITORING, EVALUATION, GOAL: weight trends, nutrition impact symptoms, PO intake, labs     Next Visit: Remote next month   April Manson, RDN, LDN Registered Dietitian, [facility]  (Usual office hours: Tuesday-Thursday) Mobile: [phone]

## 2022-07-09 ENCOUNTER — Other Ambulatory Visit: Payer: Self-pay

## 2022-07-12 ENCOUNTER — Telehealth: Payer: Self-pay

## 2022-07-12 NOTE — Telephone Encounter (Signed)
Patients wife called stating patient was very weak, could barley bare weight in legs, BP 153/101 and HR checked 3 times, 118,190,107. Patients wife states she tried to get him to drink some ensure and he didn't want to drink much. Discussed with Gillian Shields who advised pt go to ED for further evaluation.  Bethena Roys states she will recheck his BP in a little bit and decide if they need to go as they don't want to wait in the waiting room long.

## 2022-07-12 NOTE — Progress Notes (Incomplete)
Harry Angst Sr. is here today for follow up post radiation to the brain.    They completed their radiation on: Radiation Treatment Dates: 03/25/2022 through 04/13/2022   Does the patient complain of any of the following: Headache: *** Visual Changes: *** Hearing Changes: *** Nausea: *** Vomiting: *** Balance or coordination issues: *** Memory issues: ***  Is the patient currently on a Decadron regimen? : ***  Additional comments if applicable:

## 2022-07-13 ENCOUNTER — Encounter: Payer: Self-pay | Admitting: *Deleted

## 2022-07-13 ENCOUNTER — Ambulatory Visit: Payer: Medicare Other | Admitting: Pulmonary Disease

## 2022-07-13 ENCOUNTER — Other Ambulatory Visit: Payer: Self-pay | Admitting: *Deleted

## 2022-07-13 ENCOUNTER — Encounter: Payer: Self-pay | Admitting: Pulmonary Disease

## 2022-07-13 VITALS — BP 128/68 | HR 89

## 2022-07-13 DIAGNOSIS — C778 Secondary and unspecified malignant neoplasm of lymph nodes of multiple regions: Secondary | ICD-10-CM | POA: Diagnosis not present

## 2022-07-13 DIAGNOSIS — J910 Malignant pleural effusion: Secondary | ICD-10-CM | POA: Diagnosis not present

## 2022-07-13 DIAGNOSIS — C3490 Malignant neoplasm of unspecified part of unspecified bronchus or lung: Secondary | ICD-10-CM | POA: Diagnosis not present

## 2022-07-13 DIAGNOSIS — R0609 Other forms of dyspnea: Secondary | ICD-10-CM | POA: Diagnosis not present

## 2022-07-13 MED ORDER — DIAZEPAM 5 MG PO TABS: 5.0000 mg | ORAL_TABLET | ORAL | 0 refills | Status: AC | PRN

## 2022-07-13 MED ORDER — PREDNISONE 20 MG PO TABS: ORAL_TABLET | ORAL | 0 refills | Status: AC

## 2022-07-13 MED ORDER — BREZTRI AEROSPHERE 160-9-4.8 MCG/ACT IN AERO: 2.0000 | INHALATION_SPRAY | Freq: Two times a day (BID) | RESPIRATORY_TRACT | 0 refills | Status: AC

## 2022-07-13 NOTE — Progress Notes (Signed)
Received a call from [place], the patient's wife. She requests Valium be sent to their pharmacy for patient's MRI later this week.   Harry Brown also asks if an appointment can be made for her and their three children to come in and speak to Dr Marin Olp. She asks that this be without the patient. Spoke with our scheduler and department director on whether an appointment can be made without the patient being present. Insurance does not allow for appointments to be billed without the patient.   Called and spoke to [place]. Informed her that the medication was sent. Explained that I would need to speak to Dr Marin Olp about setting up an appointment time off the normal schedule, and that everyone present would be need to be included on the patient's release of information. I only see Harry Brown and a daughter Harry Brown listed. She will get the other children added. She is also open to a phone call on speaker. I will talk to Dr Marin Olp and call Harry Brown back tomorrow.   Oncology Nurse Navigator Documentation     07/13/2022    2:15 PM  Oncology Nurse Navigator Flowsheets  Navigator Follow Up Date: 07/14/2022  Navigator Follow Up Reason: Patient Call  Navigator Location [facility]  Navigator Encounter Type Telephone  Patient Visit Type MedOnc  Treatment Phase Active Tx  Barriers/Navigation Needs Coordination of Care;Education  Interventions Education;Psycho-Social Support  Acuity Level 2-Minimal Needs (1-2 Barriers Identified)  Education Method Verbal  Support Groups/Services Friends and Family  Time Spent with Patient 30

## 2022-07-13 NOTE — Progress Notes (Signed)
@Patient  ID: Harry Brown., male    DOB: May 02, 1941, 82 y.o.   MRN: [id number]  Chief Complaint  Patient presents with   Follow-up    Pt is very weak now and is requiring physical assistance. Heart rate increasing. Not eating or drinking much anymore. Due to have MRI of brain in a few days and blood work in a few days to determine if he can get more chemo.     Referring provider: [name], MD  HPI:   82 y.o. man whom are seeing in follow-up for evaluation of mediastinal mass and right-sided pleural effusion with subsequent diagnosis of lung cancer with evidence of metastases including brain metastasis undergoing treatment currently on hold due to failure to thrive.  Most recent radiation oncology note reviewed.  Most recent oncology note reviewed.  Overall, not doing well.  His weight is down about 10 pounds.  Poor appetite.  Worsening breathing.  More short of breath, more cough.  Recent PET scan showed good treatment response.  However, due to his failure to thrive, lack of ability etc. chemotherapy was put on hold most recently.  He has upcoming meeting with his oncologist next week to discuss further.  He has no energy.  Poor appetite.  Again very short of breath.  Losing weight.  HPI initial visit: Patient and wife present for visit today.  There is some weakness that developed maybe 3-4 months ago.  At 2 months ago he felt onset of shortness of breath.  Particularly dyspnea with exertion.  Progressed to minimal exertion causing significant shortness of breath.  This prompted ongoing cardiac evaluation.  This was some concern on exam for diminished breath sounds on the right base.  My review and interpretation chest x-ray 02/04/22 showed small right pleural effusion.  From my review interpretation chest x-ray okay 02/18/22 reveals similar to slightly smaller pleural effusion. This prompted CT scan 03/04/22 which revealed on my review and interpretation large mediastinal mass right  paratracheal extending to right hilum all the way to the subcarinal area with a couple of notable right-sided nodules and a moderate right pleural effusion.  This prompted pulmonary consultation.  He smoked quite a lot.  Quit about 10 years ago.  75-pack-year history recorded.  No major issues of breathing prior to this.  PMH: Tobacco abuse in remission, atrial fibrillation on Eliquis, hypertension Surgical history: Cataract surgery, CABG, femoropopliteal bypass, tonsillectomy Family history: Mother with hyperlipidemia, CAD, diabetes, hypertension, father with CAD, asthma Social history: Former smoker, 75-pack-year, quit 2014, lives in [place] / Pulmonary Flowsheets:   ACT:      No data to display           MMRC:     No data to display           Epworth:      No data to display           Tests:   FENO:  No results found for: "NITRICOXIDE"  PFT:     No data to display           WALK:      No data to display           Imaging: Personally reviewed NM PET Image Restag (PS) Skull Base To Thigh  Result Date: 06/26/2022 CLINICAL DATA:  Subsequent treatment strategy for non-small cell lung cancer. EXAM: NUCLEAR MEDICINE PET SKULL BASE TO THIGH TECHNIQUE: 8.33 mCi F-18 FDG was injected intravenously. Full-ring PET  imaging was performed from the skull base to thigh after the radiotracer. CT data was obtained and used for attenuation correction and anatomic localization. Fasting blood glucose: 125 mg/dl COMPARISON:  03/24/2022 FINDINGS: Mediastinal blood pool activity: SUV max 2.59 Liver activity: SUV max NA NECK: No hypermetabolic lymph nodes in the neck. Incidental CT findings: None CHEST: Moderate right pleural effusion is similar in volume compared to the previous exam extending up to the apex of the right lung. Interval resolution of previous tracer avid right supraclavicular lymph node. -Index right paratracheal lymph node measures 1 cm  and has an SUV max of 3.63. Formally this measured 2.5 cm and had an SUV max of 10.79. -Index right hilar node measures 1.3 cm within SUV max of 3.12. Previously 2.3 cm with SUV max of 11.34. -Index subcarinal node measures 2 cm with SUV max of 4.0, image 77/4. Previously 3.3 cm with SUV max of 13.37. -Index left paratracheal lymph node measures 1.8 cm with SUV max of 3.16, image 68/4. Previously 2.5 cm with SUV max of 12.33. -New central nodular density within the right middle lobe measures 2.1 cm and has an SUV max of 7.93, increased architectural distortion and volume loss is identified within the adjacent anterior and medial right middle lobe. -Two of the three, previously noted nodules within the lateral aspect of the right lower lobe have resolved in the interval. The third nodule has decreased in size and appears less solid on today's study measuring 9 mm with SUV max of 1.08, image 46/7. New postinflammatory changes identified within the central aspect of the lingula and left lower lobe, image 54/7 and image 50/7. -New sub solid nodule in the superior segment of the left lower lobe measures 9 mm without significant tracer uptake, image 31/7. Incidental CT findings: Heart size is upper limits of normal. Aortic atherosclerosis. Status post CABG. No pericardial effusion. ABDOMEN/PELVIS: No abnormal hypermetabolic activity within the liver, pancreas, adrenal glands, or spleen. No hypermetabolic lymph nodes in the abdomen or pelvis. Incidental CT findings: Aortic atherosclerosis. Infrarenal abdominal aorta measures 3.2 cm, image 147/4. Bilateral renal calcifications. Moderate to large stool burden scratch set moderate stool burden noted within the colon. Large stool burden distends the rectum. SKELETON: No focal hypermetabolic activity to suggest skeletal metastasis. Incidental CT findings: None. IMPRESSION: 1. Interval response to therapy. There has been decrease in size and degree of tracer uptake associated  with previously noted right supraclavicular, right hilar, and mediastinal lymph nodes. 2. Two of the three previously noted nodules within the right lower lobe have resolved in the interval. The third nodule is decreased in size, appears less solid and has mild low level FDG uptake. 3. There is a new nodular density within the central aspect of the right middle lobe which is FDG avid with an SUV max of 7.93. Although a new site of disease cannot be excluded with a high degree of certainty in the setting of external beam radiation to this area these findings may reflect post treatment consolidation and inflammation. Consider short-term interval follow-up with CT of the chest with contrast material in 3 months. 4. New sub solid nodule in the superior segment of the left lower lobe measures 9 mm without significant tracer uptake. This is nonspecific and may be inflammatory or infectious in etiology. 5. New postinflammatory changes identified within the left lower lobe which may be post infectious or post treatment in etiology. 6. Moderate right pleural effusion is similar in volume compared with the previous exam.  7. Infrarenal abdominal aorta measures 3.2 cm. Recommend follow-up every 3 years. 8. Large stool burden noted within the colon and rectum. Correlate for any clinical signs or symptoms of constipation. 9. Bilateral renal calcifications . 10.  Aortic Atherosclerosis (ICD10-I70.0). Electronically Signed   By: Kerby Moors M.D.   On: 06/26/2022 06:56    Lab Results:  CBC    Component Value Date/Time   WBC 12.9 (H) 06/28/2022 0815   WBC 9.0 04/28/2022 0657   RBC 3.03 (L) 06/28/2022 0815   HGB 9.5 (L) 06/28/2022 0815   HGB 12.5 (L) 02/04/2022 0921   HCT 28.9 (L) 06/28/2022 0815   HCT 38.1 02/04/2022 0921   PLT 327 06/28/2022 0815   PLT 248 02/04/2022 0921   MCV 95.4 06/28/2022 0815   MCV 94 02/04/2022 0921   MCH 31.4 06/28/2022 0815   MCHC 32.9 06/28/2022 0815   RDW 16.2 (H) 06/28/2022 0815    RDW 13.0 02/04/2022 0921   LYMPHSABS 1.3 06/28/2022 0815   MONOABS 1.3 (H) 06/28/2022 0815   EOSABS 0.0 06/28/2022 0815   BASOSABS 0.1 06/28/2022 0815    BMET    Component Value Date/Time   NA 138 06/28/2022 0815   NA 139 02/04/2022 0921   K 4.0 06/28/2022 0815   CL 103 06/28/2022 0815   CO2 23 06/28/2022 0815   GLUCOSE 144 (H) 06/28/2022 0815   BUN 46 (H) 06/28/2022 0815   BUN 21 02/04/2022 0921   CREATININE 0.85 06/28/2022 0815   CREATININE 1.16 (H) 04/03/2020 1044   CALCIUM 9.7 06/28/2022 0815   GFRNONAA >60 06/28/2022 0815   GFRAA >60 11/22/2019 1602    BNP No results found for: "BNP"  ProBNP    Component Value Date/Time   PROBNP 169.0 (H) 02/18/2022 1509    Specialty Problems       Pulmonary Problems   Sleep apnea   Pleural effusion, malignant   Lung mass   Pleural effusion   Small cell carcinoma of lung metastatic to lymph nodes of multiple sites ([place])   Small cell lung carcinoma, right (HCC)   Lung cancer metastatic to brain (H[facility])    Allergies  Allergen Reactions   Ativan [Lorazepam] Other (See Comments)    Proloneged lethargy   Adhesive [Tape] Rash   Latex Rash   Other Hives, Swelling and Rash    Plastic shopping bags Metal staples: rash, swelling    Immunization History  Administered Date(s) Administered   Fluad Quad(high Dose 65+) 04/20/2019, 04/03/2020, 04/13/2021   Influenza Split 03/31/2012   Influenza, High Dose Seasonal PF 03/20/2013, 02/24/2015, 03/11/2016, 03/15/2017, 05/02/2018   Influenza,inj,Quad PF,6+ Mos 01/29/2014   Influenza,trivalent, recombinat, inj, PF 04/06/2011   Influenza-Unspecified 04/06/2011   PFIZER(Purple Top)SARS-COV-2 Vaccination 06/27/2019, 07/18/2019, 03/14/2020   Pfizer Covid-19 Vaccine Bivalent Booster 45yrs & up 04/13/2021   Pneumococcal Conjugate-13 03/20/2013   Pneumococcal Polysaccharide-23 10/11/2004, 02/24/2015   Td 10/12/2003, 06/07/2009   Td (Adult), 2 Lf Tetanus Toxid, Preservative Free 10/12/2003     Past Medical History:  Diagnosis Date   Anemia 09/24/2013   CAD (coronary artery disease)    Cellulitis 05/14/2013   RLE   Diverticulitis    Dyspnea    03/15/22- due to fluid in lung.   ED (erectile dysfunction)    Glaucoma 08/18/2014   History of radiation therapy    Brain-03/25/22-04/13/22- Dr. Gery Pray   History of sleep apnea    had surgery to correct   Hypertension    Hypothyroidism    Medicare  annual wellness visit, subsequent 04/13/2013   Myocardial infarction ([place]) 11/05/1985   Nocturia 03/15/2017   Other and unspecified hyperlipidemia    Penile lesion 02/24/2015   Peripheral neuropathy 03/31/2012   Peripheral vascular disease ([place])    Postoperative anemia due to acute blood loss 09/24/2013   Preventative health care 03/13/2016   Sleep apnea 09/16/2017   Small cell carcinoma of lung metastatic to lymph nodes of multiple sites ([place]) 03/26/2022   Small cell lung carcinoma, right ([place]) 03/26/2022   Type II diabetes mellitus ([place])    fasting 100-120   Urinary urgency 11/22/2012    Tobacco History: Social History   Tobacco Use  Smoking Status Former   Packs/day: 1.50   Years: 50.00   Total pack years: 75.00   Types: Cigarettes   Quit date: 2006   Years since quitting: 18.1  Smokeless Tobacco Never  Tobacco Comments   05/14/2013 "quit smoking in ~ 2004; still Uses Comit lozenges"   Counseling given: Not Answered Tobacco comments: 05/14/2013 "quit smoking in ~ 2004; still Uses Comit lozenges"   Continue to not smoke  Outpatient Encounter Medications as of 07/13/2022  Medication Sig   apixaban (ELIQUIS) 2.5 MG TABS tablet Take 1 tablet (2.5 mg total) by mouth 2 (two) times daily.   atenolol (TENORMIN) 25 MG tablet TAKE ONE (1) TABLET BY MOUTH EVERY DAY (Patient taking differently: Take 25 mg by mouth daily.)   atorvastatin (LIPITOR) 40 MG tablet TAKE ONE (1) TABLET BY MOUTH EACH DAY AT6PM   Cholecalciferol (VITAMIN D) 50 MCG (2000 UT) tablet Take 2,000  Units by mouth daily.    dexamethasone (DECADRON) 4 MG tablet Take 2 tabs by mouth starting day after last dose of etoposide for one day only. Repeat every 21 days. Take with food.   latanoprost (XALATAN) 0.005 % ophthalmic solution Place 1 drop into both eyes at bedtime.    levothyroxine (SYNTHROID) 75 MCG tablet Take 1 tablet (75 mcg total) by mouth daily before breakfast.   lidocaine-prilocaine (EMLA) cream Apply 1 Application topically as needed.   loratadine (CLARITIN) 10 MG tablet Take 10 mg by mouth daily as needed for allergies.   megestrol (MEGACE) 400 MG/10ML suspension Take 10 mLs (400 mg total) by mouth 2 (two) times daily. Please take 2 Tsp TWICE a day for appetite   metFORMIN (GLUCOPHAGE-XR) 500 MG 24 hr tablet TAKE TWO (2) TABLETS BY MOUTH EVERY MORNING WITH BREAKFAST   ondansetron (ZOFRAN) 8 MG tablet Take 1 tablet (8 mg total) by mouth every 8 (eight) hours as needed for nausea, vomiting or refractory nausea / vomiting. Start on the third day after carboplatin.   ONETOUCH DELICA LANCETS 35H MISC USE TO CHECK BLOOD SUGAR ONCE DAILY   ONETOUCH ULTRA test strip USE 1 STRIP DAILY   predniSONE (DELTASONE) 20 MG tablet Take 2 tablets (40 mg total) by mouth daily with breakfast for 5 days, THEN 1 tablet (20 mg total) daily with breakfast for 5 days.   prochlorperazine (COMPAZINE) 10 MG tablet Take 1 tablet (10 mg total) by mouth every 6 (six) hours as needed for nausea or vomiting.   tamsulosin (FLOMAX) 0.4 MG CAPS capsule TAKE ONE CAPSULE BY MOUTH DAILY   vitamin B-12 (CYANOCOBALAMIN) 1000 MCG tablet Take 1,000 mcg by mouth daily.   nitroGLYCERIN (NITROSTAT) 0.4 MG SL tablet Place 1 tablet (0.4 mg total) under the tongue every 5 (five) minutes as needed for chest pain.   No facility-administered encounter medications on file as of 07/13/2022.  Review of Systems  Review of Systems  N/a Physical Exam  BP 128/68 (BP Location: Left Arm, Patient Position: Sitting, Cuff Size: Normal)    Pulse 89   SpO2 92%   Wt Readings from Last 5 Encounters:  06/28/22 158 lb (71.7 kg)  06/11/22 167 lb (75.8 kg)  06/01/22 166 lb (75.3 kg)  05/25/22 164 lb (74.4 kg)  05/13/22 164 lb 12.8 oz (74.8 kg)    BMI Readings from Last 5 Encounters:  06/28/22 21.43 kg/m  06/11/22 22.65 kg/m  06/01/22 22.51 kg/m  05/25/22 22.24 kg/m  05/13/22 22.35 kg/m     Physical Exam General: Sitting in chair, no acute distress Eyes: EOMI, no icterus Neck: Supple, no JVP Pulmonary: Diminished in the right base, otherwise clear, normal work of breathing Cardiovascular: Warm, no edema Abdomen: Nondistended, bowel sounds present MSK: No synovitis, no joint effusion Neuro: Normal gait, no weakness Psych: Normal mood, full affect   Assessment & Plan:   Mediastinal lymphadenopathy/mass with right-sided lung nodules and right pleural effusion with subsequent diagnosis of lung cancer on EBUS: -- Appreciate oncology assistance  Worsening cough, dyspnea on exertion: Did not improve substantially after initial thoracentesis.  Trial of prednisone taper with triple inhaled therapy, ICS, LAMA, LABA to see if aids in symptoms.  Suspect this is all related to tumor and failure to thrive.  While imaging is shown improvement, he certainly seems worse.   Return in about 2 years (around 07/13/2024).   Lanier Clam, MD 07/13/2022   This appointment required 42 minutes of patient care (this includes precharting, chart review, review of results, face-to-face care, etc.).

## 2022-07-13 NOTE — Patient Instructions (Signed)
Nice to see you again  Take prednisone 40 mg daily for 5 days then 20 mg for 5 days  Try Breztri 2 puff twice a day every day - rinse mouth after every use  Send me a message next week and let me know if helping or not  Return to clinic in 2 months or sooner as needed

## 2022-07-13 NOTE — Addendum Note (Signed)
Addended by: Retia Passe on: 07/13/2022 03:46 PM   Modules accepted: Orders

## 2022-07-14 ENCOUNTER — Encounter: Payer: Self-pay | Admitting: *Deleted

## 2022-07-14 ENCOUNTER — Telehealth: Payer: Self-pay | Admitting: *Deleted

## 2022-07-14 ENCOUNTER — Other Ambulatory Visit: Payer: Self-pay

## 2022-07-14 NOTE — Progress Notes (Signed)
Spoke to Dr Marin Olp and he is okay with family coming in for discussion. Appointment made. Called and spoke to [place]. She is aware of the appointment date, time and location. She is appreciative for the appointment.   Oncology Nurse Navigator Documentation     07/14/2022    8:45 AM  Oncology Nurse Navigator Flowsheets  Navigator Follow Up Date: 07/21/2022  Navigator Follow Up Reason: Follow-up Appointment  Navigator Location [facility]  Navigator Encounter Type Telephone  Patient Visit Type MedOnc  Treatment Phase Active Tx  Barriers/Navigation Needs Coordination of Care;Education  Education Other  Interventions Coordination of Care;Education  Acuity Level 2-Minimal Needs (1-2 Barriers Identified)  Coordination of Care Appts  Education Method Verbal  Support Groups/Services Friends and Family  Time Spent with Patient 15

## 2022-07-14 NOTE — Telephone Encounter (Signed)
RETURNED PATIENT'S WIFE'S PHONE CALL, LVM FOR A RETURN CALL

## 2022-07-15 ENCOUNTER — Ambulatory Visit (HOSPITAL_COMMUNITY)
Admission: RE | Admit: 2022-07-15 | Discharge: 2022-07-15 | Disposition: A | Discharge status: Home / Self Care | Payer: Medicare Other | Source: Ambulatory Visit | Attending: Radiation Oncology | Admitting: Radiation Oncology

## 2022-07-15 ENCOUNTER — Encounter (HOSPITAL_COMMUNITY): Payer: Self-pay | Admitting: *Deleted

## 2022-07-15 DIAGNOSIS — I6381 Other cerebral infarction due to occlusion or stenosis of small artery: Secondary | ICD-10-CM | POA: Diagnosis not present

## 2022-07-15 DIAGNOSIS — C7931 Secondary malignant neoplasm of brain: Secondary | ICD-10-CM | POA: Diagnosis not present

## 2022-07-15 DIAGNOSIS — G9389 Other specified disorders of brain: Secondary | ICD-10-CM | POA: Diagnosis not present

## 2022-07-15 MED ORDER — GADOBUTROL 1 MMOL/ML IV SOLN
7.0000 mL | Freq: Once | INTRAVENOUS | Status: AC | PRN
  Administered 2022-07-15: 7 mL via INTRAVENOUS

## 2022-07-19 ENCOUNTER — Inpatient Hospital Stay: Payer: Medicare Other | Attending: Hematology & Oncology

## 2022-07-19 ENCOUNTER — Ambulatory Visit
Admission: RE | Admit: 2022-07-19 | Discharge: 2022-07-19 | Disposition: A | Discharge status: Home / Self Care | Payer: Medicare Other | Source: Ambulatory Visit | Attending: Radiation Oncology | Admitting: Radiation Oncology

## 2022-07-19 ENCOUNTER — Other Ambulatory Visit: Payer: Self-pay | Admitting: Hematology & Oncology

## 2022-07-19 ENCOUNTER — Telehealth: Payer: Self-pay | Admitting: *Deleted

## 2022-07-19 ENCOUNTER — Inpatient Hospital Stay: Payer: Medicare Other | Admitting: Hematology & Oncology

## 2022-07-19 MED ORDER — MORPHINE SULFATE (CONCENTRATE) 20 MG/ML PO SOLN: 10.0000 mg | ORAL | 0 refills | Status: AC | PRN

## 2022-07-19 NOTE — Telephone Encounter (Signed)
Per dr Marin Olp and family request, [facility] called to start services.  Information given.  They will initiate contact tomorrow.

## 2022-07-21 ENCOUNTER — Inpatient Hospital Stay: Payer: Medicare Other

## 2022-07-21 ENCOUNTER — Inpatient Hospital Stay: Payer: Medicare Other | Admitting: Hematology & Oncology

## 2022-07-21 ENCOUNTER — Encounter: Payer: Self-pay | Admitting: *Deleted

## 2022-07-21 NOTE — Progress Notes (Signed)
Patient's family had meeting with Dr Marin Olp earlier this week and decided that patient would transition to hospice. Will discontinue active navigation at this time.   Oncology Nurse Navigator Documentation     07/21/2022    9:15 AM  Oncology Nurse Navigator Flowsheets  Navigation Complete Date: 07/21/2022  Post Navigation: Continue to Follow Patient? No  Reason Not Navigating Patient: Airline pilot Encounter Type Appt/Treatment Plan Review  Patient Visit Type MedOnc  Treatment Phase Active Tx  Barriers/Navigation Needs Coordination of Care;Education  Interventions None Required  Acuity Level 2-Minimal Needs (1-2 Barriers Identified)  Support Groups/Services Friends and Family  Time Spent with Patient 15

## 2022-07-22 ENCOUNTER — Inpatient Hospital Stay: Payer: Medicare Other

## 2022-07-22 ENCOUNTER — Telehealth: Payer: Self-pay | Admitting: *Deleted

## 2022-07-22 ENCOUNTER — Other Ambulatory Visit: Payer: Medicare Other

## 2022-07-22 ENCOUNTER — Telehealth: Payer: Self-pay | Admitting: Dietician

## 2022-07-22 ENCOUNTER — Encounter: Payer: Medicare Other | Admitting: Dietician

## 2022-07-22 NOTE — Telephone Encounter (Signed)
RETURNED PATIENT'S PHONE CALL, SPOKE WITH PATIENT. ?

## 2022-07-22 NOTE — Telephone Encounter (Signed)
Attempted to reach patient and spouse to let them know I was cancelling appointment today, in response to decision to move to hospice care.  Mail box was full.  April Manson, RDN, LDN Registered Dietitian, [place] Part Time Remote (Usual office hours: Tuesday-Thursday) Mobile: [phone] Remote Office: [phone]

## 2022-07-23 ENCOUNTER — Ambulatory Visit: Payer: Medicare Other

## 2022-07-25 ENCOUNTER — Telehealth: Payer: Self-pay | Admitting: Internal Medicine

## 2022-07-25 NOTE — Telephone Encounter (Signed)
Called at 12:15 by [facility]---they were then unable to reach the nurse who had called from a facility-----but it was a death notification (at 10:36PM yesterday)

## 2022-07-26 ENCOUNTER — Telehealth: Payer: Self-pay

## 2022-07-26 ENCOUNTER — Ambulatory Visit: Payer: Medicare Other

## 2022-07-26 ENCOUNTER — Ambulatory Visit: Admission: RE | Admit: 2022-07-26 | Payer: Medicare Other | Source: Ambulatory Visit | Admitting: Radiation Oncology

## 2022-07-26 DIAGNOSIS — C3491 Malignant neoplasm of unspecified part of right bronchus or lung: Secondary | ICD-10-CM

## 2022-07-26 DIAGNOSIS — C3490 Malignant neoplasm of unspecified part of unspecified bronchus or lung: Secondary | ICD-10-CM

## 2022-07-26 NOTE — Telephone Encounter (Signed)
Received fax from on call service that that they had received notification from [place], hospice RN that pt passed away on 2022/07/31 at 10:36pm. Dr Marin Olp notified. [place] ordered for pt's wife per Dr Marin Olp. dph

## 2022-08-06 DEATH — deceased

## 2022-09-13 ENCOUNTER — Ambulatory Visit: Payer: Medicare Other | Admitting: Pulmonary Disease

## 2023-10-14 IMAGING — MR MR MRA HEAD W/O CM
1 series · 19 of 48 positions shown · non-contrast
Comparison: 08/26/2021 head CT

CLINICAL DATA: Fall with dizziness.  Disequilibrium for 2-3 months

EXAM:
MRI HEAD WITHOUT CONTRAST
MRA HEAD WITHOUT CONTRAST
TECHNIQUE: Multiplanar, multi-echo pulse sequences of the brain and surrounding
structures were acquired without intravenous contrast. Angiographic
images of the Circle of Willis were acquired using MRA technique
without intravenous contrast.

[Series 5: tof_3d_multi-slab · axial · 0.5mm · 0.35mm/px · z∈[-86,+55]mm · 19 of 298 slices shown]
[im 1/298]
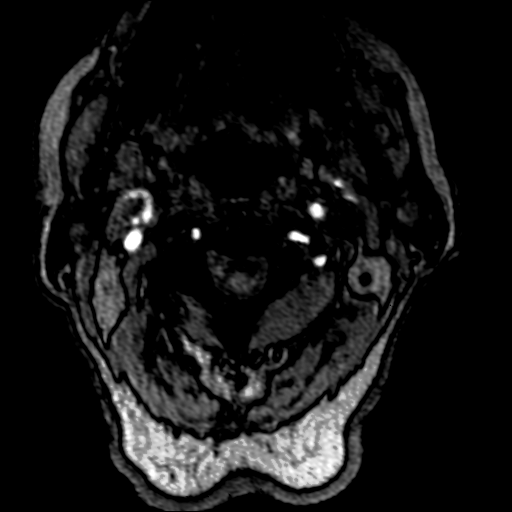
[im 7/298]
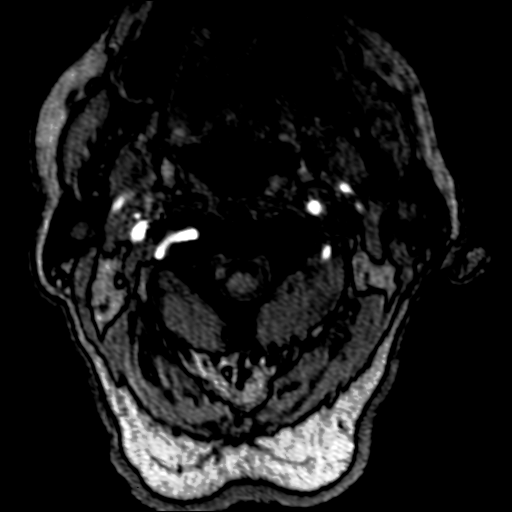
[im 13/298]
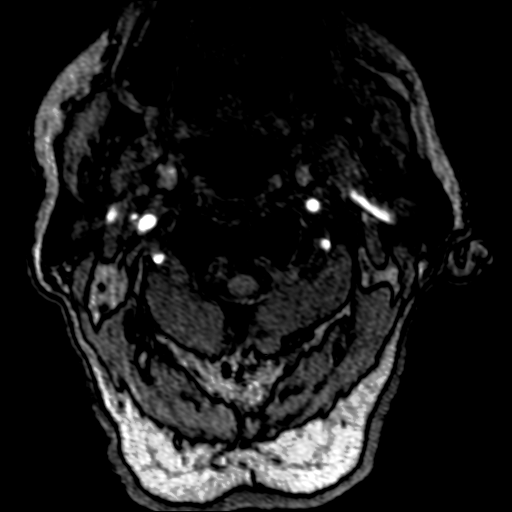
[im 19/298]
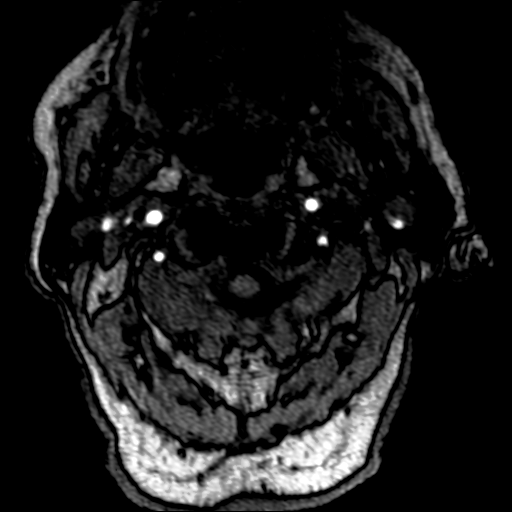
[im 26/298]
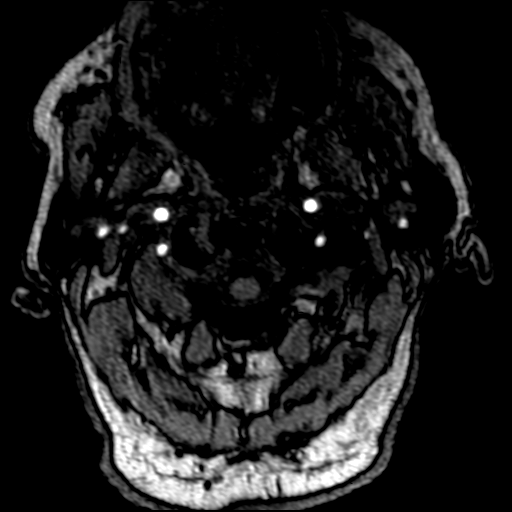
[im 32/298]
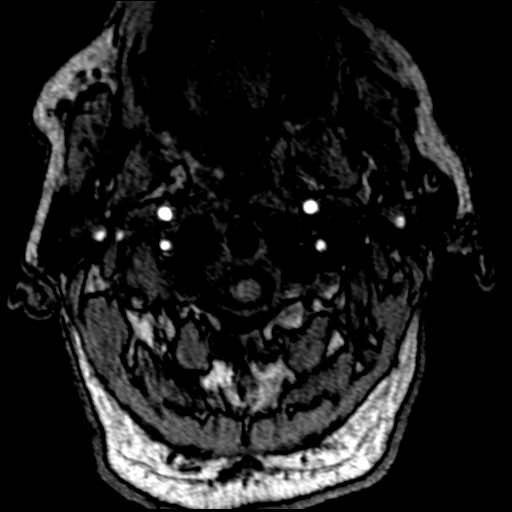
[im 38/298]
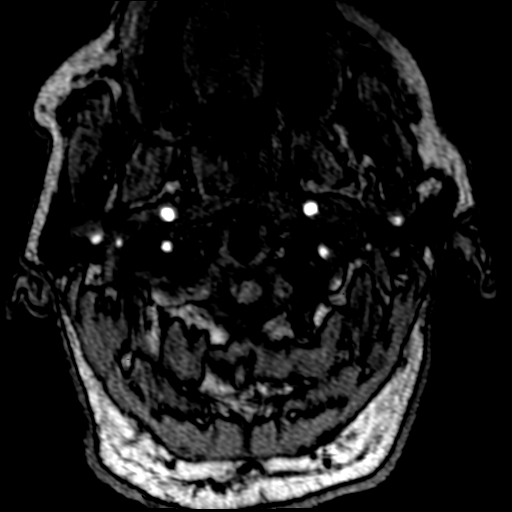
[im 45/298]
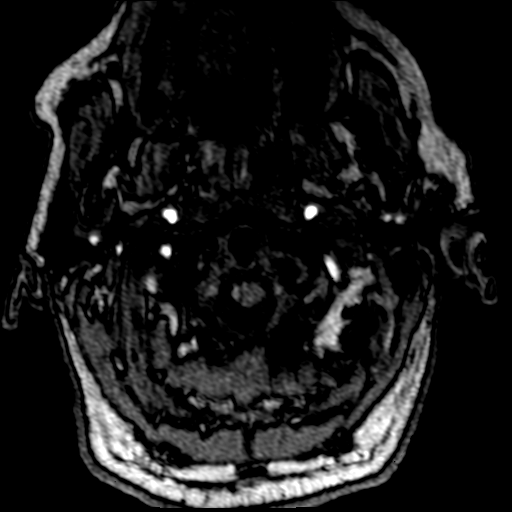
[im 51/298]
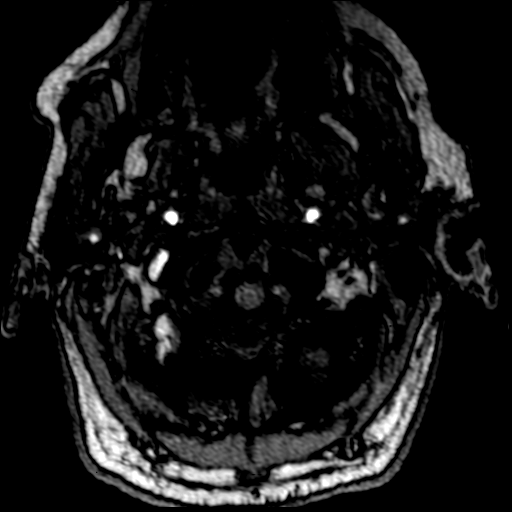
[im 57/298]
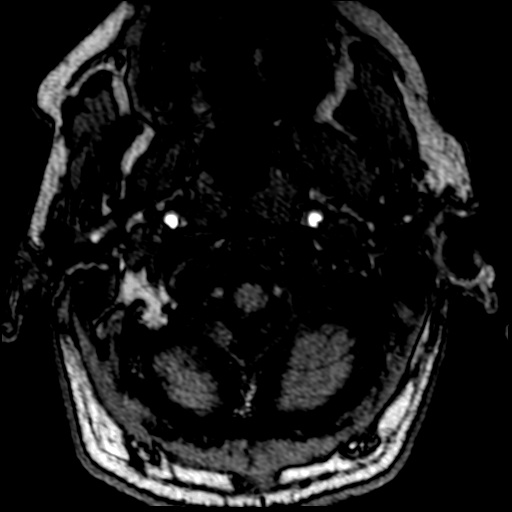
[im 64/298]
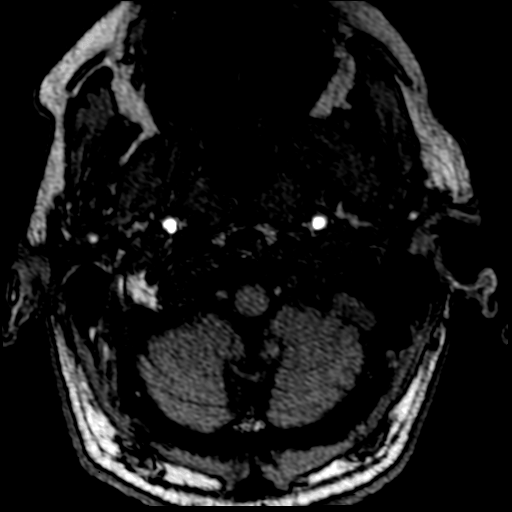
[im 95/298]
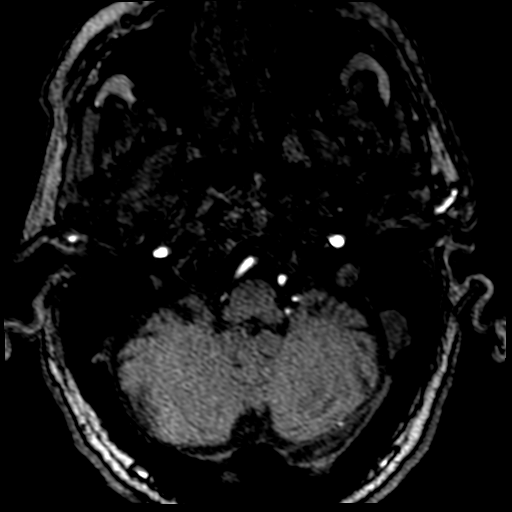
[im 133/298]
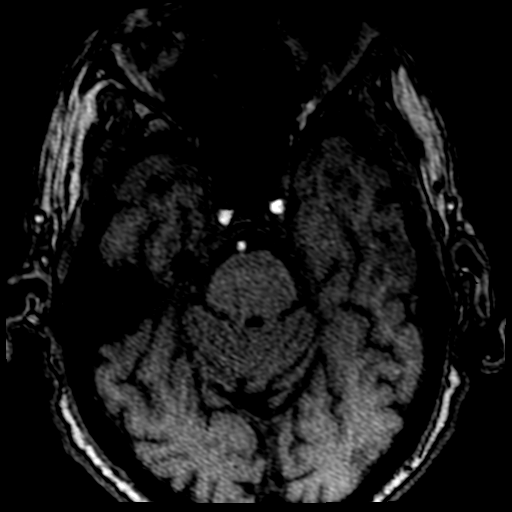
[im 152/298]
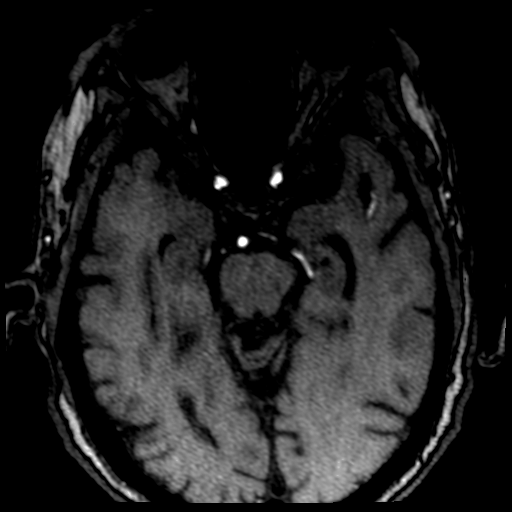
[im 171/298]
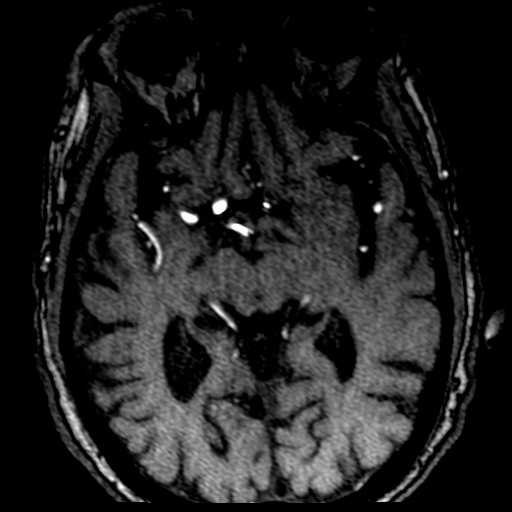
[im 209/298]
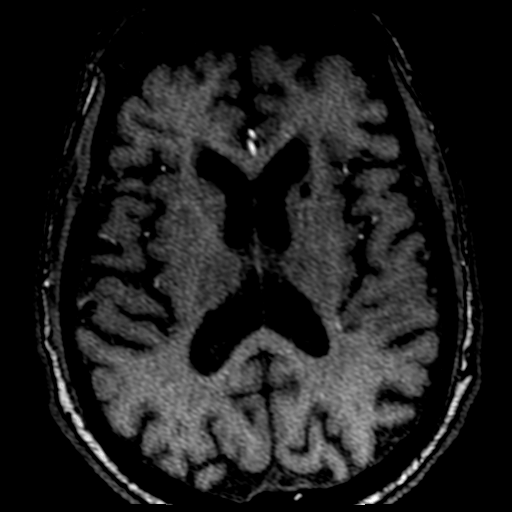
[im 247/298]
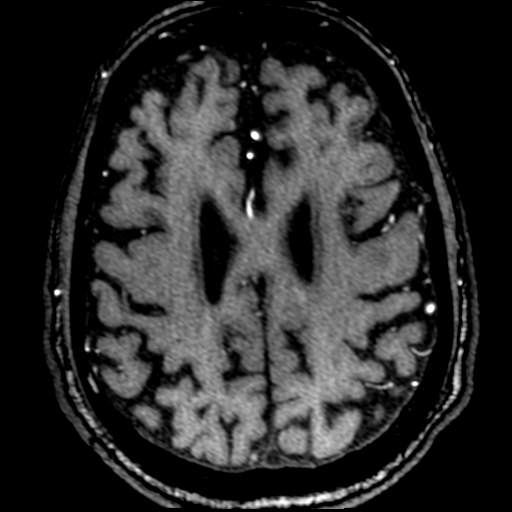
[im 253/298]
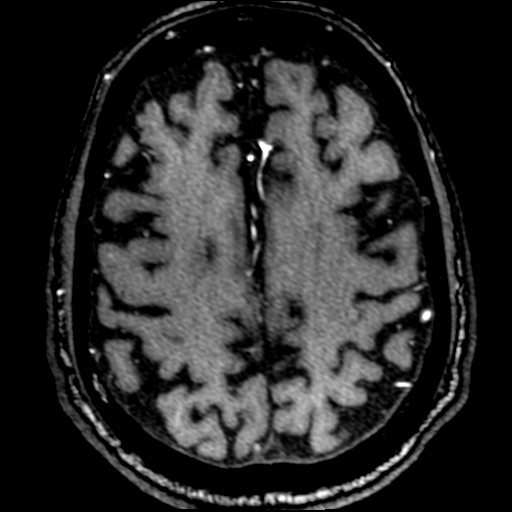
[im 285/298]
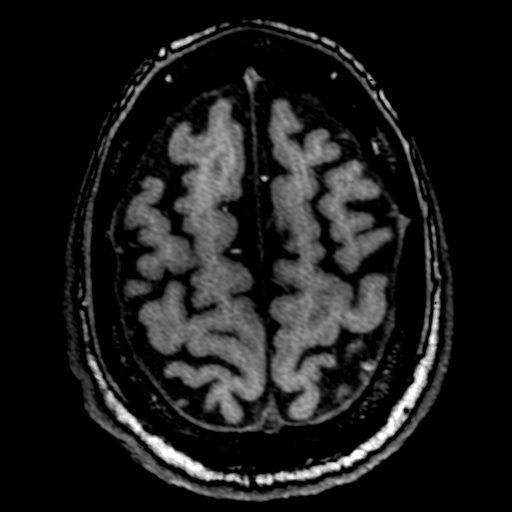

[19 of 48 positions shown; findings below may reference images not displayed]

FINDINGS: MRI HEAD FINDINGS

Brain: No acute infarction, hemorrhage, hydrocephalus, extra-axial
collection or mass lesion. Generalized cerebral volume loss. Mild
chronic small vessel ischemic gliosis in the periventricular white
matter. Chronic lacunar infarct at the left caudate and thalamus.

Vascular: Major flow voids are preserved

Skull and upper cervical spine: No focal marrow lesion.

Sinuses/Orbits: Negative for acute finding. Bilateral cataract
resection.

MRA HEAD FINDINGS

Anterior circulation: Mild atheromatous irregularity of the carotid
siphons. No branch occlusion, beading, or aneurysm. Hypoplastic left
A1 segment. Fetal type left PCA.

Posterior circulation: The vertebral and basilar arteries are
diffusely patent. No branch occlusion, beading, or aneurysm.
IMPRESSION: Brain MRI:

1. No emergent finding or specific cause of dizziness.
2. Cerebral volume loss. Chronic lacunar infarcts at the left
thalamus and left caudate.

Intracranial MRA:

1. No emergent finding.
2. Overall mild atheromatous changes. No evidence of vertebrobasilar
insufficiency.
